# Patient Record
Sex: Female | Born: 1945 | Race: Black or African American | Hispanic: No | Marital: Single | Smoking: Never smoker
Health system: Southern US, Community
[De-identification: ages and names within clinical notes are randomized; demographics above are authoritative.]

## PROBLEM LIST (undated history)

## (undated) DIAGNOSIS — R6250 Unspecified lack of expected normal physiological development in childhood: Secondary | ICD-10-CM

## (undated) DIAGNOSIS — M7989 Other specified soft tissue disorders: Secondary | ICD-10-CM

## (undated) DIAGNOSIS — Z9989 Dependence on other enabling machines and devices: Secondary | ICD-10-CM

## (undated) DIAGNOSIS — R112 Nausea with vomiting, unspecified: Secondary | ICD-10-CM

## (undated) DIAGNOSIS — R600 Localized edema: Secondary | ICD-10-CM

## (undated) DIAGNOSIS — R0902 Hypoxemia: Secondary | ICD-10-CM

## (undated) DIAGNOSIS — F329 Major depressive disorder, single episode, unspecified: Secondary | ICD-10-CM

## (undated) DIAGNOSIS — I951 Orthostatic hypotension: Secondary | ICD-10-CM

## (undated) DIAGNOSIS — R109 Unspecified abdominal pain: Secondary | ICD-10-CM

## (undated) DIAGNOSIS — K219 Gastro-esophageal reflux disease without esophagitis: Secondary | ICD-10-CM

## (undated) DIAGNOSIS — F32A Depression, unspecified: Secondary | ICD-10-CM

## (undated) DIAGNOSIS — F419 Anxiety disorder, unspecified: Secondary | ICD-10-CM

## (undated) DIAGNOSIS — K5730 Diverticulosis of large intestine without perforation or abscess without bleeding: Secondary | ICD-10-CM

## (undated) DIAGNOSIS — R05 Cough: Secondary | ICD-10-CM

## (undated) DIAGNOSIS — R1310 Dysphagia, unspecified: Secondary | ICD-10-CM

## (undated) DIAGNOSIS — R3129 Other microscopic hematuria: Secondary | ICD-10-CM

## (undated) DIAGNOSIS — J45909 Unspecified asthma, uncomplicated: Secondary | ICD-10-CM

## (undated) DIAGNOSIS — N3941 Urge incontinence: Secondary | ICD-10-CM

## (undated) DIAGNOSIS — I10 Essential (primary) hypertension: Secondary | ICD-10-CM

## (undated) DIAGNOSIS — G4733 Obstructive sleep apnea (adult) (pediatric): Secondary | ICD-10-CM

## (undated) DIAGNOSIS — K5792 Diverticulitis of intestine, part unspecified, without perforation or abscess without bleeding: Secondary | ICD-10-CM

## (undated) DIAGNOSIS — K625 Hemorrhage of anus and rectum: Secondary | ICD-10-CM

## (undated) DIAGNOSIS — F411 Generalized anxiety disorder: Secondary | ICD-10-CM

## (undated) HISTORY — DX: Hypoxemia: R09.02

## (undated) HISTORY — DX: Generalized anxiety disorder: F41.1

## (undated) HISTORY — PX: ABDOMINAL HYSTERECTOMY: SHX81

## (undated) HISTORY — DX: Major depressive disorder, single episode, unspecified: F32.9

## (undated) HISTORY — DX: Other microscopic hematuria: R31.29

## (undated) HISTORY — DX: Localized edema: R60.0

## (undated) HISTORY — DX: Orthostatic hypotension: I95.1

## (undated) HISTORY — DX: Dependence on other enabling machines and devices: Z99.89

## (undated) HISTORY — DX: Dysphagia, unspecified: R13.10

## (undated) HISTORY — DX: Diverticulosis of large intestine without perforation or abscess without bleeding: K57.30

## (undated) HISTORY — PX: COLON SURGERY: SHX602

## (undated) HISTORY — DX: Unspecified abdominal pain: R10.9

## (undated) HISTORY — PX: BREAST BIOPSY: SHX20

## (undated) HISTORY — DX: Cough: R05

## (undated) HISTORY — DX: Hemorrhage of anus and rectum: K62.5

## (undated) HISTORY — DX: Obstructive sleep apnea (adult) (pediatric): G47.33

## (undated) HISTORY — DX: Other specified soft tissue disorders: M79.89

## (undated) HISTORY — DX: Nausea with vomiting, unspecified: R11.2

## (undated) HISTORY — DX: Urge incontinence: N39.41

## (undated) HISTORY — DX: Depression, unspecified: F32.A

---

## 2008-06-20 ENCOUNTER — Inpatient Hospital Stay: Payer: Self-pay | Admitting: Internal Medicine

## 2008-09-29 ENCOUNTER — Ambulatory Visit: Payer: Self-pay | Admitting: Gastroenterology

## 2009-03-12 ENCOUNTER — Inpatient Hospital Stay: Payer: Self-pay | Admitting: Internal Medicine

## 2010-10-10 ENCOUNTER — Inpatient Hospital Stay: Payer: Self-pay | Admitting: Internal Medicine

## 2011-06-09 ENCOUNTER — Emergency Department: Payer: Self-pay | Admitting: Emergency Medicine

## 2011-06-14 ENCOUNTER — Emergency Department: Payer: Self-pay | Admitting: Emergency Medicine

## 2011-11-28 ENCOUNTER — Ambulatory Visit: Payer: Self-pay

## 2012-03-29 ENCOUNTER — Ambulatory Visit: Payer: Self-pay

## 2012-08-19 ENCOUNTER — Inpatient Hospital Stay: Payer: Self-pay | Admitting: Internal Medicine

## 2012-08-19 LAB — URINALYSIS, COMPLETE
Bilirubin,UR: NEGATIVE
Glucose,UR: NEGATIVE mg/dL (ref 0–75)
Hyaline Cast: 185
Nitrite: NEGATIVE
Ph: 5 (ref 4.5–8.0)
Protein: 100
RBC,UR: 15 /HPF (ref 0–5)
Specific Gravity: 1.025 (ref 1.003–1.030)
Squamous Epithelial: 8
WBC UR: 6 /HPF (ref 0–5)

## 2012-08-19 LAB — COMPREHENSIVE METABOLIC PANEL
Albumin: 3.1 g/dL — ABNORMAL LOW (ref 3.4–5.0)
Alkaline Phosphatase: 100 U/L (ref 50–136)
Anion Gap: 7 (ref 7–16)
BUN: 26 mg/dL — ABNORMAL HIGH (ref 7–18)
Bilirubin,Total: 0.1 mg/dL — ABNORMAL LOW (ref 0.2–1.0)
Calcium, Total: 8.5 mg/dL (ref 8.5–10.1)
Chloride: 109 mmol/L — ABNORMAL HIGH (ref 98–107)
Co2: 26 mmol/L (ref 21–32)
Creatinine: 1.27 mg/dL (ref 0.60–1.30)
EGFR (African American): 51 — ABNORMAL LOW
EGFR (Non-African Amer.): 44 — ABNORMAL LOW
Glucose: 191 mg/dL — ABNORMAL HIGH (ref 65–99)
Osmolality: 293 (ref 275–301)
Potassium: 3.5 mmol/L (ref 3.5–5.1)
SGOT(AST): 15 U/L (ref 15–37)
SGPT (ALT): 15 U/L (ref 12–78)
Sodium: 142 mmol/L (ref 136–145)
Total Protein: 7 g/dL (ref 6.4–8.2)

## 2012-08-19 LAB — CBC
HCT: 33.8 % — ABNORMAL LOW (ref 35.0–47.0)
HGB: 10.6 g/dL — ABNORMAL LOW (ref 12.0–16.0)
MCH: 27.7 pg (ref 26.0–34.0)
MCHC: 31.4 g/dL — ABNORMAL LOW (ref 32.0–36.0)
MCV: 88 fL (ref 80–100)
Platelet: 213 10*3/uL (ref 150–440)
RBC: 3.83 10*6/uL (ref 3.80–5.20)
RDW: 15.6 % — ABNORMAL HIGH (ref 11.5–14.5)
WBC: 9.5 10*3/uL (ref 3.6–11.0)

## 2012-08-19 LAB — LIPASE, BLOOD: Lipase: 44 U/L — ABNORMAL LOW (ref 73–393)

## 2012-08-19 LAB — TROPONIN I: Troponin-I: 0.04 ng/mL

## 2012-08-19 LAB — PROTIME-INR
INR: 1.1
Prothrombin Time: 14.1 secs (ref 11.5–14.7)

## 2012-08-19 LAB — APTT: Activated PTT: 25.9 secs (ref 23.6–35.9)

## 2012-08-19 LAB — HEMOGLOBIN A1C: Hemoglobin A1C: 6.3 % (ref 4.2–6.3)

## 2012-08-20 LAB — COMPREHENSIVE METABOLIC PANEL
Albumin: 2.7 g/dL — ABNORMAL LOW (ref 3.4–5.0)
Alkaline Phosphatase: 84 U/L (ref 50–136)
Anion Gap: 8 (ref 7–16)
BUN: 22 mg/dL — ABNORMAL HIGH (ref 7–18)
Bilirubin,Total: 0.2 mg/dL (ref 0.2–1.0)
Calcium, Total: 7.8 mg/dL — ABNORMAL LOW (ref 8.5–10.1)
Chloride: 108 mmol/L — ABNORMAL HIGH (ref 98–107)
Co2: 28 mmol/L (ref 21–32)
Creatinine: 0.82 mg/dL (ref 0.60–1.30)
EGFR (African American): >60
EGFR (Non-African Amer.): >60
Glucose: 135 mg/dL — ABNORMAL HIGH (ref 65–99)
Osmolality: 292 (ref 275–301)
Potassium: 4.3 mmol/L (ref 3.5–5.1)
SGOT(AST): 12 U/L — ABNORMAL LOW (ref 15–37)
SGPT (ALT): 15 U/L (ref 12–78)
Sodium: 144 mmol/L (ref 136–145)
Total Protein: 6.1 g/dL — ABNORMAL LOW (ref 6.4–8.2)

## 2012-08-20 LAB — CBC WITH DIFFERENTIAL/PLATELET
Basophil #: 0.1 10*3/uL (ref 0.0–0.1)
Basophil %: 0.3 %
Eosinophil #: 0 10*3/uL (ref 0.0–0.7)
Eosinophil %: 0 %
HCT: 31.1 % — ABNORMAL LOW (ref 35.0–47.0)
HGB: 9.9 g/dL — ABNORMAL LOW (ref 12.0–16.0)
Lymphocyte #: 1.1 10*3/uL (ref 1.0–3.6)
Lymphocyte %: 7.1 %
MCH: 27.9 pg (ref 26.0–34.0)
MCHC: 31.7 g/dL — ABNORMAL LOW (ref 32.0–36.0)
MCV: 88 fL (ref 80–100)
Monocyte #: 0.6 x10 3/mm (ref 0.2–0.9)
Monocyte %: 3.9 %
Neutrophil #: 14.3 10*3/uL — ABNORMAL HIGH (ref 1.4–6.5)
Neutrophil %: 88.7 %
Platelet: 168 10*3/uL (ref 150–440)
RBC: 3.54 10*6/uL — ABNORMAL LOW (ref 3.80–5.20)
RDW: 15.1 % — ABNORMAL HIGH (ref 11.5–14.5)
WBC: 16.1 10*3/uL — ABNORMAL HIGH (ref 3.6–11.0)

## 2012-08-20 LAB — HEMOGLOBIN
HGB: 9.5 g/dL — ABNORMAL LOW (ref 12.0–16.0)
HGB: 9.2 g/dL — ABNORMAL LOW (ref 12.0–16.0)
HGB: 9 g/dL — ABNORMAL LOW (ref 12.0–16.0)

## 2012-08-20 LAB — MAGNESIUM: Magnesium: 1.8 mg/dL

## 2012-08-21 LAB — CBC WITH DIFFERENTIAL/PLATELET
Basophil #: 0.1 10*3/uL (ref 0.0–0.1)
Basophil %: 0.6 %
Eosinophil #: 0.1 10*3/uL (ref 0.0–0.7)
Eosinophil %: 0.5 %
HCT: 26.7 % — ABNORMAL LOW (ref 35.0–47.0)
HGB: 8.6 g/dL — ABNORMAL LOW (ref 12.0–16.0)
Lymphocyte #: 3.4 10*3/uL (ref 1.0–3.6)
Lymphocyte %: 36 %
MCH: 28.5 pg (ref 26.0–34.0)
MCHC: 32.4 g/dL (ref 32.0–36.0)
MCV: 88 fL (ref 80–100)
Monocyte #: 0.8 x10 3/mm (ref 0.2–0.9)
Monocyte %: 8.5 %
Neutrophil #: 5.1 10*3/uL (ref 1.4–6.5)
Neutrophil %: 54.4 %
Platelet: 154 10*3/uL (ref 150–440)
RBC: 3.03 10*6/uL — ABNORMAL LOW (ref 3.80–5.20)
RDW: 15.5 % — ABNORMAL HIGH (ref 11.5–14.5)
WBC: 9.4 10*3/uL (ref 3.6–11.0)

## 2012-08-22 LAB — PROTIME-INR
INR: 1.1
Prothrombin Time: 14.8 secs — ABNORMAL HIGH (ref 11.5–14.7)

## 2012-08-22 LAB — CBC WITH DIFFERENTIAL/PLATELET
Basophil #: 0.1 10*3/uL (ref 0.0–0.1)
Basophil %: 0.8 %
Eosinophil #: 0 10*3/uL (ref 0.0–0.7)
Eosinophil %: 0.4 %
HCT: 27.2 % — ABNORMAL LOW (ref 35.0–47.0)
HGB: 8.5 g/dL — ABNORMAL LOW (ref 12.0–16.0)
Lymphocyte #: 2.4 10*3/uL (ref 1.0–3.6)
Lymphocyte %: 24.3 %
MCH: 27.8 pg (ref 26.0–34.0)
MCHC: 31.3 g/dL — ABNORMAL LOW (ref 32.0–36.0)
MCV: 89 fL (ref 80–100)
Monocyte #: 0.7 x10 3/mm (ref 0.2–0.9)
Monocyte %: 7 %
Neutrophil #: 6.7 10*3/uL — ABNORMAL HIGH (ref 1.4–6.5)
Neutrophil %: 67.5 %
Platelet: 177 10*3/uL (ref 150–440)
RBC: 3.06 10*6/uL — ABNORMAL LOW (ref 3.80–5.20)
RDW: 15.6 % — ABNORMAL HIGH (ref 11.5–14.5)
WBC: 10 10*3/uL (ref 3.6–11.0)

## 2012-08-22 LAB — APTT: Activated PTT: 28.6 secs (ref 23.6–35.9)

## 2012-08-23 LAB — BASIC METABOLIC PANEL
Anion Gap: 2 — ABNORMAL LOW (ref 7–16)
BUN: 17 mg/dL (ref 7–18)
Calcium, Total: 8.4 mg/dL — ABNORMAL LOW (ref 8.5–10.1)
Chloride: 106 mmol/L (ref 98–107)
Co2: 32 mmol/L (ref 21–32)
Creatinine: 0.95 mg/dL (ref 0.60–1.30)
EGFR (African American): >60
EGFR (Non-African Amer.): >60
Glucose: 107 mg/dL — ABNORMAL HIGH (ref 65–99)
Osmolality: 281 (ref 275–301)
Potassium: 3.8 mmol/L (ref 3.5–5.1)
Sodium: 140 mmol/L (ref 136–145)

## 2012-08-23 LAB — CBC WITH DIFFERENTIAL/PLATELET
Basophil #: 0 10*3/uL (ref 0.0–0.1)
Basophil %: 0.4 %
Eosinophil #: 0.2 10*3/uL (ref 0.0–0.7)
Eosinophil %: 1.9 %
HCT: 27.3 % — ABNORMAL LOW (ref 35.0–47.0)
HGB: 8.8 g/dL — ABNORMAL LOW (ref 12.0–16.0)
Lymphocyte #: 3.1 10*3/uL (ref 1.0–3.6)
Lymphocyte %: 32.6 %
MCH: 28.7 pg (ref 26.0–34.0)
MCHC: 32.3 g/dL (ref 32.0–36.0)
MCV: 89 fL (ref 80–100)
Monocyte #: 0.8 x10 3/mm (ref 0.2–0.9)
Monocyte %: 8.6 %
Neutrophil #: 5.4 10*3/uL (ref 1.4–6.5)
Neutrophil %: 56.5 %
Platelet: 185 10*3/uL (ref 150–440)
RBC: 3.08 10*6/uL — ABNORMAL LOW (ref 3.80–5.20)
RDW: 15.3 % — ABNORMAL HIGH (ref 11.5–14.5)
WBC: 9.5 10*3/uL (ref 3.6–11.0)

## 2013-04-05 ENCOUNTER — Emergency Department: Payer: Self-pay | Admitting: Emergency Medicine

## 2013-04-05 LAB — COMPREHENSIVE METABOLIC PANEL
Albumin: 3.5 g/dL (ref 3.4–5.0)
Alkaline Phosphatase: 119 U/L (ref 50–136)
Anion Gap: 6 — ABNORMAL LOW (ref 7–16)
BUN: 19 mg/dL — ABNORMAL HIGH (ref 7–18)
Bilirubin,Total: 0.3 mg/dL (ref 0.2–1.0)
Calcium, Total: 9.2 mg/dL (ref 8.5–10.1)
Chloride: 101 mmol/L (ref 98–107)
Co2: 30 mmol/L (ref 21–32)
Creatinine: 0.88 mg/dL (ref 0.60–1.30)
EGFR (African American): >60
EGFR (Non-African Amer.): >60
Glucose: 101 mg/dL — ABNORMAL HIGH (ref 65–99)
Osmolality: 276 (ref 275–301)
Potassium: 4.7 mmol/L (ref 3.5–5.1)
SGOT(AST): 22 U/L (ref 15–37)
SGPT (ALT): 17 U/L (ref 12–78)
Sodium: 137 mmol/L (ref 136–145)
Total Protein: 8.2 g/dL (ref 6.4–8.2)

## 2013-04-05 LAB — URINALYSIS, COMPLETE
Bilirubin,UR: NEGATIVE
Glucose,UR: NEGATIVE mg/dL (ref 0–75)
Ketone: NEGATIVE
Leukocyte Esterase: NEGATIVE
Nitrite: NEGATIVE
Ph: 5 (ref 4.5–8.0)
Protein: NEGATIVE
RBC,UR: 1 /HPF (ref 0–5)
Specific Gravity: 1.01 (ref 1.003–1.030)
Squamous Epithelial: 1
WBC UR: 1 /HPF (ref 0–5)

## 2013-04-05 LAB — CBC
HCT: 40.1 % (ref 35.0–47.0)
HGB: 12.9 g/dL (ref 12.0–16.0)
MCH: 27.6 pg (ref 26.0–34.0)
MCHC: 32.3 g/dL (ref 32.0–36.0)
MCV: 86 fL (ref 80–100)
Platelet: 202 10*3/uL (ref 150–440)
RBC: 4.68 10*6/uL (ref 3.80–5.20)
RDW: 16.9 % — ABNORMAL HIGH (ref 11.5–14.5)
WBC: 7.3 10*3/uL (ref 3.6–11.0)

## 2013-04-05 LAB — TROPONIN I: Troponin-I: <0.02 ng/mL

## 2013-04-05 LAB — LIPASE, BLOOD: Lipase: 93 U/L (ref 73–393)

## 2014-01-06 LAB — BASIC METABOLIC PANEL
BUN: 13 mg/dL (ref 4–21)
Creatinine: 0.8 mg/dL (ref <=1.1)

## 2014-01-06 LAB — TSH: TSH: 1.2 u[IU]/mL (ref <=5.90)

## 2014-02-01 ENCOUNTER — Emergency Department: Payer: Self-pay | Admitting: Emergency Medicine

## 2014-02-01 LAB — CBC WITH DIFFERENTIAL/PLATELET
Basophil #: 0 10*3/uL (ref 0.0–0.1)
Basophil %: 0.6 %
Eosinophil #: 0.3 10*3/uL (ref 0.0–0.7)
Eosinophil %: 3.6 %
HCT: 39.6 % (ref 35.0–47.0)
HGB: 12.5 g/dL (ref 12.0–16.0)
Lymphocyte #: 2.1 10*3/uL (ref 1.0–3.6)
Lymphocyte %: 24.7 %
MCH: 28.5 pg (ref 26.0–34.0)
MCHC: 31.6 g/dL — ABNORMAL LOW (ref 32.0–36.0)
MCV: 90 fL (ref 80–100)
Monocyte #: 0.7 x10 3/mm (ref 0.2–0.9)
Monocyte %: 8.1 %
Neutrophil #: 5.4 10*3/uL (ref 1.4–6.5)
Neutrophil %: 63 %
Platelet: 239 10*3/uL (ref 150–440)
RBC: 4.39 10*6/uL (ref 3.80–5.20)
RDW: 15.9 % — ABNORMAL HIGH (ref 11.5–14.5)
WBC: 8.6 10*3/uL (ref 3.6–11.0)

## 2014-02-01 LAB — COMPREHENSIVE METABOLIC PANEL
Albumin: 3.6 g/dL (ref 3.4–5.0)
Alkaline Phosphatase: 106 U/L
Anion Gap: 6 — ABNORMAL LOW (ref 7–16)
BUN: 14 mg/dL (ref 7–18)
Bilirubin,Total: 0.2 mg/dL (ref 0.2–1.0)
Calcium, Total: 9.2 mg/dL (ref 8.5–10.1)
Chloride: 105 mmol/L (ref 98–107)
Co2: 29 mmol/L (ref 21–32)
Creatinine: 1.04 mg/dL (ref 0.60–1.30)
EGFR (African American): >60
EGFR (Non-African Amer.): 56 — ABNORMAL LOW
Glucose: 103 mg/dL — ABNORMAL HIGH (ref 65–99)
Osmolality: 280 (ref 275–301)
Potassium: 3.5 mmol/L (ref 3.5–5.1)
SGOT(AST): 22 U/L (ref 15–37)
SGPT (ALT): 22 U/L (ref 12–78)
Sodium: 140 mmol/L (ref 136–145)
Total Protein: 8.6 g/dL — ABNORMAL HIGH (ref 6.4–8.2)

## 2014-02-01 LAB — TROPONIN I: Troponin-I: <0.02 ng/mL

## 2014-02-01 LAB — LIPASE, BLOOD: Lipase: 57 U/L — ABNORMAL LOW (ref 73–393)

## 2014-04-21 ENCOUNTER — Ambulatory Visit: Payer: Self-pay | Admitting: Gastroenterology

## 2014-04-21 LAB — HM COLONOSCOPY

## 2014-06-27 ENCOUNTER — Ambulatory Visit: Payer: Self-pay | Admitting: Internal Medicine

## 2014-06-27 LAB — HM MAMMOGRAPHY: HM Mammogram: NORMAL

## 2014-11-07 NOTE — Consult Note (Signed)
Admit Diagnosis:   ACUTE GI BLEEDING: 22-Aug-2012, Active, ACUTE GI BLEEDING      Admit Reason:   Acute GI bleeding: (578.9) 19-Aug-2012, Active, ICD9, Unspecified, hemorrhage of gastrointestinal tract    Present Illness 69 year-old  admitted for GI bleeding. Noted to have blood on external vulva, moderate amount. S/P hysterectomy. reports no recent vaginal bleeding or hematuria. some vaginal discharge and odor off and on for months. Denies use of pessary, denies any recent gyn problems or surgery. Denies prolapse.     Fluconazole tablet,  ( Diflucan)  150 mg Oral daily  Stop After: 3 Days  - Indication: Antifungal, 22-Aug-2012, Active, Standard   metroNIDAZOLE 0.75% Vag Gel, ( Metrogel 0.75% Vag gel )  1 applicator Vaginal at bedtime  Stop After: 5 Days  -Indication:Infection, 22-Aug-2012, Active, Standard   metroNIDAZOLE tablet, ( Flagyl)  500 mg Oral q12h  Stop After: 7 Days  - Indication: Infection, 22-Aug-2012, Active, Standard   Ob/Gyn Consult, In [place] - requires response within 24 hours  Notify-on call  Reason for-please evaluate for possible vaginal bleed ( know Hx of Hysteroctomy).  Note:ewilliams  Notified Physician Office on 21-Aug-2012 at 14:42  Elgie Collard added as a consulting care provider, 21-Aug-2012, Pending, Standard   Hygiene, 20-Aug-2012, Active, Standard   No Known Allergies:   Case History and Physical Exam:   Chief Complaint gi bleeding    Past Medical Health Hypertension    Past Surgical History TAH    Primary Care Provider [facility]    Family History Non-Contributory    Abdomen Benign    Genitalia Other  malodorous discharge, no bleeding from vagina noted, bimanual exam no vaginal masses, no thickening, no blood on glove.  foley  small blood , uretha normal  and intact, no vaginal or vulvar ulcreation or tears noted. adnexal exam limited by Body Mass Index.  Uterus absent.    Skin Other  no  ulceration of mons, vulva   Review of Systems:   Medications/Allergies Reviewed Medications/Allergies reviewed   Nursing/Ancillary Notes: **Vital Signs.:   05-Feb-14 03:39   Vital Signs Type Recheck; temp taken at this time d/t family request to let pt rest after tylenol  given   Temperature Temperature (F) 98.6    05:44   Vital Signs Type POCT    05:50   Vital Signs Type Routine   Temperature Temperature (F) 38.8   Systolic BP Systolic BP 828   Diastolic BP (mmHg) Diastolic BP (mmHg) 66    00:34   Vital Signs Type Q 4hr   Temperature Temperature (F) 98   Systolic BP Systolic BP 917   Diastolic BP (mmHg) Diastolic BP (mmHg) 71    91:50   Vital Signs Type Routine   Temperature Temperature (F) 56.9   Systolic BP Systolic BP 794   Diastolic BP (mmHg) Diastolic BP (mmHg) 64    80:16   Vital Signs Type Q 4hr   Temperature Temperature (F) 98   Systolic BP Systolic BP 553   Diastolic BP (mmHg) Diastolic BP (mmHg) 71   Hepatic:  02-Feb-14 18:11    Bilirubin, Total  0.1   Alkaline Phosphatase 100   SGPT (ALT) 15   SGOT (AST) 15   Total Protein, Serum 7.0   Albumin, Serum  3.1  03-Feb-14 00:00    Bilirubin, Total 0.2   Alkaline Phosphatase 84   SGPT (ALT) 15   SGOT (AST)  12   Total Protein, Serum  6.1  Albumin, Serum  2.7  Routine BB:  02-Feb-14 18:11    Crossmatch Unit 1 Ready   Crossmatch Unit 2 Transfused   Crossmatch Unit 3 Cancelled   Crossmatch Unit 4 Ready   ABO Group + Rh Type O Positive   Antibody Screen NEGATIVE (Result(s) reported on 19 Aug 2012 at 07:01PM.)  Cardiology:  02-Feb-14 18:13    Ventricular Rate 80   Atrial Rate 80   P-R Interval 134   QRS Duration 86   QT 368   QTc 424   P Axis 66   R Axis 52   T Axis 59   ECG interpretation Normal sinus rhythm Nonspecific ST and T wave abnormality Abnormal ECG When compared with ECG of 14-Jun-2011 11:47, Nonspecific T wave abnormality has replaced inverted T waves in Lateral  leads ----------unconfirmed---------- Confirmed by OVERREAD, NOT ([id number]), editor PEARSON, BARBARA ([id number]) on 08/21/2012 12:09:15 PM  Routine Chem:  02-Feb-14 18:11    Glucose, Serum  191   BUN  26   Creatinine (comp) 1.27   Sodium, Serum 142   Potassium, Serum 3.5   Chloride, Serum  109   CO2, Serum 26   Calcium (Total), Serum 8.5   Osmolality (calc) 293   eGFR (African American)  51   eGFR (Non-African American)  44 (eGFR values <65m/min/1.73 m2 may be an indication of chronic kidney disease (CKD). Calculated eGFR is useful in patients with stable renal function. The eGFR calculation will not be reliable in acutely ill patients when serum creatinine is changing rapidly. It is not useful in  patients on dialysis. The eGFR calculation may not be applicable to patients at the low and high extremes of body sizes, pregnant women, and vegetarians.)   Anion Gap 7   Hemoglobin A1c ([facility]) 6.3 (The American Diabetes Association recommends that a primary goal of therapy should be <7% and that physicians should reevaluate the treatment regimen in patients with HbA1c values consistently >8%.)   Result Comment XMATCH - PRODUCT NOT INDICATED.2 XMATCHES  - CURRENTLY AVAILABLE  Result(s) reported on 21 Aug 2012 at 12:24PM.   Lipase  44 (Result(s) reported on 19 Aug 2012 at 06:40PM.)  03-Feb-14 00:00    Glucose, Serum  135   BUN  22   Creatinine (comp) 0.82   Sodium, Serum 144   Potassium, Serum 4.3   Chloride, Serum  108   CO2, Serum 28   Calcium (Total), Serum  7.8   Osmolality (calc) 292   eGFR (African American) >60   eGFR (Non-African American) >60 (eGFR values <668mmin/1.73 m2 may be an indication of chronic kidney disease (CKD). Calculated eGFR is useful in patients with stable renal function. The eGFR calculation will not be reliable in acutely ill patients when serum creatinine is changing rapidly. It is not useful in  patients on dialysis. The eGFR calculation may not be  applicable to patients at the low and high extremes of body sizes, pregnant women, and vegetarians.)   Anion Gap 8   Magnesium, Serum 1.8 (1.8-2.4 THERAPEUTIC RANGE: 4-7 mg/dL TOXIC: > 10 mg/dL  -----------------------)  Cardiac:  02-Feb-14 18:11    Troponin I 0.04 (0.00-0.05 0.05 ng/mL or less: NEGATIVE  Repeat testing in 3-6 hrs  if clinically indicated. >0.05 ng/mL: POTENTIAL  MYOCARDIAL INJURY. Repeat  testing in 3-6 hrs if  clinically indicated. NOTE: An increase or decrease  of 30% or more on serial  testing suggests a  clinically important change)  Routine UA:  02-Feb-14 18:54  Color (UA) Amber   Clarity (UA) Cloudy   Glucose (UA) Negative   Bilirubin (UA) Negative   Ketones (UA) Trace   Specific Gravity (UA) 1.025   Blood (UA) 2+   pH (UA) 5.0   Protein (UA) 100 mg/dL   Nitrite (UA) Negative   Leukocyte Esterase (UA) Trace (Result(s) reported on 19 Aug 2012 at 07:17PM.)   RBC (UA) 15 /HPF   WBC (UA) 6 /HPF   Bacteria (UA) TRACE   Epithelial Cells (UA) 8 /HPF   Mucous (UA) PRESENT   Hyaline Cast (UA) 185 /LPF   Calcium Oxalate Crystal (UA) PRESENT (Result(s) reported on 19 Aug 2012 at 07:17PM.)  Routine Coag:  02-Feb-14 18:11    Prothrombin 14.1   INR 1.1 (INR reference interval applies to patients on anticoagulant therapy. A single INR therapeutic range for coumarins is not optimal for all indications; however, the suggested range for most indications is 2.0 - 3.0. Exceptions to the INR Reference Range may include: Prosthetic heart valves, acute myocardial infarction, prevention of myocardial infarction, and combinations of aspirin and anticoagulant. The need for a higher or lower target INR must be assessed individually. Reference: The Pharmacology and Management of the Vitamin K  antagonists: the seventh ACCP Conference on Antithrombotic and Thrombolytic Therapy. [id number].9741 Sept:126 (3suppl): N9146842. A HCT value >55% may artifactually increase  the PT.  In one study,  the increase was an average of 25%. Reference:  "Effect on Routine and Special Coagulation Testing Values of Citrate Anticoagulant Adjustment in Patients with High HCT Values." American Journal of Clinical Pathology 2006;126:400-405.)   Activated PTT (APTT) 25.9 (A HCT value >55% may artifactually increase the APTT. In one study, the increase was an average of 19%. Reference: "Effect on Routine and Special Coagulation Testing Values of Citrate Anticoagulant Adjustment in Patients with High HCT Values." American Journal of Clinical Pathology 2006;126:400-405.)  05-Feb-14 04:43    Prothrombin  14.8   INR 1.1 (INR reference interval applies to patients on anticoagulant therapy. A single INR therapeutic range for coumarins is not optimal for all indications; however, the suggested range for most indications is 2.0 - 3.0. Exceptions to the INR Reference Range may include: Prosthetic heart valves, acute myocardial infarction, prevention of myocardial infarction, and combinations of aspirin and anticoagulant. The need for a higher or lower target INR must be assessed individually. Reference: The Pharmacology and Management of the Vitamin K  antagonists: the seventh ACCP Conference on Antithrombotic and Thrombolytic Therapy. ULAGT.3646 Sept:126 (3suppl): N9146842. A HCT value >55% may artifactually increase the PT.  In one study,  the increase was an average of 25%. Reference:  "Effect on Routine and Special Coagulation Testing Values of Citrate Anticoagulant Adjustment in Patients with High HCT Values." American Journal of Clinical Pathology 2006;126:400-405.)   Activated PTT (APTT) 28.6 (A HCT value >55% may artifactually increase the APTT. In one study, the increase was an average of 19%. Reference: "Effect on Routine and Special Coagulation Testing Values of Citrate Anticoagulant Adjustment in Patients with High HCT Values." American Journal of Clinical Pathology  2006;126:400-405.)  Routine Hem:  02-Feb-14 18:11    WBC (CBC) 9.5   RBC (CBC) 3.83   Hemoglobin (CBC)  10.6   Hematocrit (CBC)  33.8   Platelet Count (CBC) 213 (Result(s) reported on 19 Aug 2012 at 06:24PM.)   MCV 88   MCH 27.7   MCHC  31.4   RDW  15.6  03-Feb-14 00:00    WBC (CBC)  16.1  RBC (CBC)  3.54   Hemoglobin (CBC)  9.9   Hematocrit (CBC)  31.1   Platelet Count (CBC) 168   MCV 88   MCH 27.9   MCHC  31.7   RDW  15.1   Neutrophil % 88.7   Lymphocyte % 7.1   Monocyte % 3.9   Eosinophil % 0.0   Basophil % 0.3   Neutrophil #  14.3   Lymphocyte # 1.1   Monocyte # 0.6   Eosinophil # 0.0   Basophil # 0.1 (Result(s) reported on 20 Aug 2012 at 12:24AM.)    06:17    Hemoglobin (CBC)  9.5 (Result(s) reported on 20 Aug 2012 at 06:41AM.)    10:53    Hemoglobin (CBC)  9.2 (Result(s) reported on 20 Aug 2012 at 11:22AM.)    17:41    Hemoglobin (CBC)  9.0 (Result(s) reported on 20 Aug 2012 at 06:17PM.)  04-Feb-14 04:44    WBC (CBC) 9.4   RBC (CBC)  3.03   Hemoglobin (CBC)  8.6   Hematocrit (CBC)  26.7   Platelet Count (CBC) 154   MCV 88   MCH 28.5   MCHC 32.4   RDW  15.5   Neutrophil % 54.4   Lymphocyte % 36.0   Monocyte % 8.5   Eosinophil % 0.5   Basophil % 0.6   Neutrophil # 5.1   Lymphocyte # 3.4   Monocyte # 0.8   Eosinophil # 0.1   Basophil # 0.1 (Result(s) reported on 21 Aug 2012 at 05:51AM.)  05-Feb-14 04:43    WBC (CBC) 10.0   RBC (CBC)  3.06   Hemoglobin (CBC)  8.5   Hematocrit (CBC)  27.2   Platelet Count (CBC) 177   MCV 89   MCH 27.8   MCHC  31.3   RDW  15.6   Neutrophil % 67.5   Lymphocyte % 24.3   Monocyte % 7.0   Eosinophil % 0.4   Basophil % 0.8   Neutrophil #  6.7   Lymphocyte # 2.4   Monocyte # 0.7   Eosinophil # 0.0   Basophil # 0.1 (Result(s) reported on 22 Aug 2012 at 06:34AM.)     Impression GI bleeding, stable S/P hysterectomy Vaginal bleeding, resolved Vaginitis Vulvitis Bacterial Vaginosis Atrophy of Menopause     Plan Oral and vaginal treatment with metronidazole for BV Diflucan for candidiasis Perineal care to decrease bacterial load and prevent post foley UTI Continue treatment at home if discharged before finishing all meds.  No further follow up needed for this problem.   Electronic Signatures: Elgie Collard (MD)  (Signed 05-Feb-14 18:16)  Authored: Health Issues, General Aspect/Present Illness, Orders, Allergies, History and Physical Exam, Vital Signs, Labs, Impression/Plan   Last Updated: 05-Feb-14 18:16 by Elgie Collard (MD)

## 2014-11-07 NOTE — Consult Note (Signed)
PATIENT NAME:  Taylor Johnson, Taylor Johnson MR#:  [id number] DATE OF BIRTH:  October 15, 1945  DATE OF CONSULTATION:  08/19/2012  REFERRING PHYSICIAN:  [facility] CONSULTING PHYSICIAN:  Micheline Maze, MD PRIMARY CARE PHYSICIAN: Halina Maidens, MD     CHIEF COMPLAINT: Lower intestinal bleed.   BRIEF HISTORY: The patient is a 69 year old woman admitted through the Emergency Room with significant lower GI bleed. She presented hypotensive with marked hematochezia. She responded immediately to resuscitation and was admitted for further evaluation.   She has a previous history of  significant lower GI bleeding felt to be from a diverticular source. She has had multiple colonoscopies. In 2009, she underwent an IMA embolization. She has not required any other surgical procedures. Colonoscopy revealed pan diverticulosis. She has had a previous abdominal hysterectomy. She has had previous breast surgery with lumpectomy for benign disease. She has a history of hypertension. No cardiac disease or diabetes.   CURRENT MEDICATIONS: All have been discontinued. She does not take any antihypertensives at the present time. She is followed at the [facility]. She is not using any alcohol or tobacco.   FAMILY HISTORY: Noncontributory to the current problem.   ALLERGIES: She has no drug allergies.   SOCIAL HISTORY: She denies history of hepatitis, yellow jaundice, pancreatitis, peptic ulcer disease or gallbladder disease.   PHYSICAL EXAMINATION: GENERAL: She is an alert but clearly unhappy with nasogastric suction. She is angry at the circumstances and minimally cooperative during the examination.  VITALS: Blood pressure 117/84, heart rate 62 and regular, oxygen saturation 100% on 2 liters.  HEENT: No scleral icterus. No facial deformity and no pupillary abnormalities.  NECK: Supple without adenopathy or tenderness. She has a midline trachea.  CHEST: Clear bilaterally with no adventitious sounds. She has normal pulmonary  excursion.   CARDIAC: No murmurs or gallops. She seems to be in normal sinus rhythm.  ABDOMEN: Soft with minimal distention. No tenderness. No rebound. No guarding. No hernias. She has hypoactive but present bowel sounds.  EXTREMITIES: Lower extremity exam reveals full range of motion, no deformities. PSYCHIATRIC: Unhappy, almost angry patient but no evidence of any disorientation.   IMPRESSION: This woman appears to have a lower gastrointestinal bleed likely from a diverticular source or sigmoid colon. She has not had a localization procedure, thus far. I do not see any urgent surgical indications. We will continue to follow her while she is hospitalized.   ____________________________ Micheline Maze, MD rle:cb D: 08/19/2012 22:31:51 ET T: 08/20/2012 09:36:21 ET JOB#: [id number]  cc: Micheline Maze, MD, <Dictator> Halina Maidens, MD Rodena Goldmann MD ELECTRONICALLY SIGNED 08/29/2012 18:04

## 2014-11-07 NOTE — Consult Note (Signed)
Chief Complaint:   Subjective/Chief Complaint Patient had dark stool last night. No red blood. H and H unchanged.   VITAL SIGNS/ANCILLARY NOTES: **Vital Signs.:   05-Feb-14 05:50   Vital Signs Type Routine   Temperature Temperature (F) 99.1   Celsius 37.2   Temperature Source Oral   Pulse Pulse 87   Respirations Respirations 18   Systolic BP Systolic BP 833   Diastolic BP (mmHg) Diastolic BP (mmHg) 66   Mean BP 79   Pulse Ox % Pulse Ox % 94   Pulse Ox Activity Level  At rest   Oxygen Delivery 1L   Brief Assessment:   Additional Physical Exam Abdomen is soft and benign.   Lab Results: Routine Coag:  05-Feb-14 04:43    Prothrombin  14.8   INR 1.1 (INR reference interval applies to patients on anticoagulant therapy. A single INR therapeutic range for coumarins is not optimal for all indications; however, the suggested range for most indications is 2.0 - 3.0. Exceptions to the INR Reference Range may include: Prosthetic heart valves, acute myocardial infarction, prevention of myocardial infarction, and combinations of aspirin and anticoagulant. The need for a higher or lower target INR must be assessed individually. Reference: The Pharmacology and Management of the Vitamin K  antagonists: the seventh ACCP Conference on Antithrombotic and Thrombolytic Therapy. ASNKN.3976 Sept:126 (3suppl): [id number]. A HCT value >55% may artifactually increase the PT.  In one study,  the increase was an average of 25%. Reference:  "Effect on Routine and Special Coagulation Testing Values of Citrate Anticoagulant Adjustment in Patients with High HCT Values." American Journal of Clinical Pathology 2006;126:400-405.)   Activated PTT (APTT) 28.6 (A HCT value >55% may artifactually increase the APTT. In one study, the increase was an average of 19%. Reference: "Effect on Routine and Special Coagulation Testing Values of Citrate Anticoagulant Adjustment in Patients with High HCT  Values." American Journal of Clinical Pathology 2006;126:400-405.)  Routine Hem:  05-Feb-14 04:43    WBC (CBC) 10.0   RBC (CBC)  3.06   Hemoglobin (CBC)  8.5   Hematocrit (CBC)  27.2   Platelet Count (CBC) 177   MCV 89   MCH 27.8   MCHC  31.3   RDW  15.6   Neutrophil % 67.5   Lymphocyte % 24.3   Monocyte % 7.0   Eosinophil % 0.4   Basophil % 0.8   Neutrophil #  6.7   Lymphocyte # 2.4   Monocyte # 0.7   Eosinophil # 0.0   Basophil # 0.1 (Result(s) reported on 22 Aug 2012 at 06:34AM.)   Assessment/Plan:  Assessment/Plan:   Assessment Probable diverticular bleed, clinically resolved. H and H stable.    Plan Will advance to full liquids. If no further bleeding for next 24-48 hours may be discharged with OP GI follow up.   Electronic Signatures: Jill Side (MD)  (Signed 05-Feb-14 09:36)  Authored: Chief Complaint, VITAL SIGNS/ANCILLARY NOTES, Brief Assessment, Lab Results, Assessment/Plan   Last Updated: 05-Feb-14 09:36 by Jill Side (MD)

## 2014-11-07 NOTE — Consult Note (Signed)
PATIENT NAME:  Taylor Johnson, WARR MR#:  [id number] DATE OF BIRTH:  February 22, 1946  DATE OF CONSULTATION:  08/20/2012  REFERRING PHYSICIAN:       CONSULTING PHYSICIAN:  Jill Side, MD  REASON FOR CONSULTATION: Lower GI bleed.   HISTORY OF PRESENT ILLNESS: This is a 69 year old female who apparently has had at least 2 episodes of diverticular bleeding in the past. It appears that her last episode of bleeding was about 2 years ago. Colonoscopy in 2010 showed pan diverticulosis and apparently the patient had IMA embolization at some point as well. The patient was in her normal state of health until yesterday afternoon when she started to pass a large amount of bright red blood per rectum. She had altered mental status, brought to the Emergency Room where systolic blood pressure was only in the 60s. She was hydrated and resuscitated and responded fairly well. She was admitted to the Intensive Care Unit. An NG tube did not show any bleeding. Since admission the patient has had only 1 small bowel movement last night, which was somewhat bloody and a very small amount of blood-tinged liquid per rectum this morning. Her hemoglobin and hematocrit are fairly stable except for a minimal drop since admission. She denies any abdominal pain. She was vomiting according to her, before. She came to the Emergency Room, but not anymore.   PAST MEDICAL HISTORY: 1.  History of lower GI bleed at least twice in the past thought to be secondary to diverticulosis. 2.  Colonoscopy in 2010 showed pan diverticulosis.  3.  History of IMA embolization in the past.  4.  History of fibrocystic breast disease. 5.  Questionable hypertension.   PAST SURGICAL HISTORY: 1.  Hysterectomy.  2.  Lumpectomy.   MEDICATIONS: None.   ALLERGIES: None.   SOCIAL HISTORY: Does not smoke or drink.   FAMILY HISTORY: Unremarkable.   REVIEW OF SYSTEMS:  Grossly negative except for what is mentioned in the History of Present Illness.   PHYSICAL  EXAMINATION: GENERAL: Fairly well built female, somewhat obese. She does not appear to be in any acute distress, NG tube is in place draining light green liquid. VITAL SIGNS:  Respirations about 15 to 18. Heart rate is in 60s and 70s. Blood pressure is stable at 116/40.  LUNGS: Grossly clear to auscultation bilaterally with fair entry.  CARDIOVASCULAR: Regular rate and rhythm. No gallops or murmur.  ABDOMEN: Abdomen is very soft and benign. Bowel sounds positive. Nontender, nondistended. No rebound or guarding was noted.  NEUROLOGIC: She is quite awake and alert at this time.   LABORATORY AND DIAGNOSTIC DATA:   Her most recent hemoglobin is 9.2, it was 10.6 on admission. White cell count was 9.5 on admission, reported to be 16 this morning.  Platelet count is normal at 168. Electrolytes are fairly unremarkable. BUN is slightly elevated at 22 and creatinine is normal.   ASSESSMENT AND PLAN: The patient is with what appears to be recurrent diverticular bleed. This is probably her third bleed within the last few years. Colonoscopy in 2010 showed pan diverticulosis. The patient has been evaluated by surgery as well. Currently there are no signs of active bleeding and the bleeding seems to have subsided. Her hemoglobin and hematocrit are stable and she is hemodynamically stable as well. I would recommend discontinuing the nasogastric tube. The patient can be started on clear liquid diet. I agree with Dr. Pat Patrick that further localization of the site of bleeding would be helpful in determining further treatment  plan. Since there is no active bleeding, this would be difficult and I would recommend continuing to observe the patient and obtaining a stat bleeding scan in case of recurrent bleeding. Follow hemoglobin and hematocrit and transfuse if needed. Further recommendations to follow.  Will follow.      ____________________________ Jill Side, MD si:ct D: 08/20/2012 12:25:11 ET T: 08/20/2012 12:57:48  ET JOB#: [id number]  cc: Jill Side, MD, <Dictator> Jill Side MD ELECTRONICALLY SIGNED 08/24/2012 12:57

## 2014-11-07 NOTE — Consult Note (Signed)
Chief Complaint:   Subjective/Chief Complaint One episode of rectal bleeding last night vs ? vaginal bleeding. Bleeding scan negative. H and H stable.  Agree with continued observation and gynae consultation. Follo H and H . Will follow.   Electronic Signatures: Jill Side (MD)  (Signed 04-Feb-14 17:07)  Authored: Chief Complaint   Last Updated: 04-Feb-14 17:07 by Jill Side (MD)

## 2014-11-07 NOTE — H&P (Signed)
PATIENT NAME:  Taylor Johnson, Taylor Johnson MR#:  [id number] DATE OF BIRTH:  [birth year]  DATE OF ADMISSION:  08/19/2012  REASON FOR ADMISSION: Lower GI bleeding with hemorrhagic shock.   PRIMARY CARE PHYSICIAN: Dr. Halina Maidens.   REFERRING PHYSICIAN: Dr. Graciella Freer.   HISTORY OF PRESENT ILLNESS: The patient is a very nice 69 year old female who has a history of diverticular bleeding in the past from sigmoid colon diverticulosis and previous episodes of diverticulitis. She came today with a history of acute bleeding. Once she came back from church, sat down on the toilet and saw a lot of blood coming out with blood clots. The patient was brought here to the ER by her family. She was confused with altered mental status due to her bleeding and shock. Her blood pressure was low in the 60s. At that moment in the ER when she was evaluated by Dr. Jasmine December, the patient had significant bleeding coming out without any signs of decreasing from her rectum. The bleeding was bright red. The patient was given 4 liters of fluid and 1 unit of packed red blood cells. At this moment, her blood pressure has improved. She was very lethargic and not able to answer questions while she was in shock. At this moment, she is a little bit better. She is somehow regaining color and consciousness but still she seems to be lethargic and confused to some degree. I spoke with the family for a long time. They had a lot of questions. Mostly, all of the questions were what is going to happen in the future. At this moment, I tried to answer them to my best, although most of those questions will still not have an answer until we take care of the acute issue. The patient has been her normal self. She is not able to answer questions right now due to still being lethargic and having a lot of discomfort through her nose due to the NG tube. She does not want to talk. The family says that the patient has been in her normal state of health. She got up normally  today. She did not have any nausea, dizziness, lightheadedness up until she started bleeding. She started bleeding around 2 p.m. after coming from church. She has been having normal bowel movements without melena or hematochezia.   REVIEW OF SYSTEMS: A 12 system review of systems is unable to be completed due to the patient's inability of answering questions at this moment. I spoke with the family, and they told me there has not been any fever. There have not been any changes of her weight. No shortness of breath up until now. Positive vomiting several times at home. Positive GI bleeding. No chest pain. No palpitations.   PAST MEDICAL HISTORY:  1. Diverticulosis with episodes of diverticulitis.  2. GI bleeding from the sigmoid colon.  3. Fibrocystic breast disease.  4. Apparently, the patient has been told she has hypertension but has never been started on medications as per the family.   SURGICAL HISTORY: Positive hysterectomy. Positive lumpectomy of the breast due to fibrocystic breast disease.   MEDICATIONS: The patient is not currently taking any oral medications.   ALLERGIES: No known drug allergies.   SOCIAL HISTORY: The patient is single. She lives with her mother. She has no kids. She does not work. She does not drink alcohol and does not smoke.   FAMILY HISTORY: Positive for hypertension and diabetes in her mom.   PHYSICAL EXAM:  VITAL SIGNS: Blood pressure  of 60s/40s, heart rate 73, respirations around 20 to 25 at this moment, temperature 97.6. Oxygen saturation dropped to the 70s. Right now, she is on 6 liters of oxygen at 100%.  GENERAL: The patient is awake but lethargic, not wanting to answer questions due to NG tube in her nose and also she is confused. She looks critically ill, and she is uncomfortable.  HEAD, EYES, EARS, NOSE, THROAT: Her pupils are equal and reactive. Extraocular movements are intact. Mucosa is dry. Anicteric sclerae. Pink conjunctivae. No oral lesions. No  oropharyngeal exudates.  NECK: Supple. No JVD. No thyromegaly. No adenopathy. No carotid bruits. No rigidity. The patient has an NG tube in the right naris secured with tape.  CARDIOVASCULAR: Regular rate and rhythm. No murmurs, rubs or gallops. No tenderness to palpation to anterior chest. No displacement of PMI.  LUNGS: Coarse in both bases, right more than left, likely due to aspiration as the patient had a large emesis while she was very lethargic. Positive use of accessory muscles. No dullness to percussion.  ABDOMEN: Soft, nontender, nondistended. No hepatosplenomegaly. No masses. Bowel sounds are positive.  GENITAL: Negative for external lesions.  EXTREMITIES: No edema. No cyanosis. No clubbing. Pulses +2. Signs of hypoperfusion with clammy skin and decreased capillary refill at around 4 to 5 seconds.  NEUROLOGIC: The patient is lethargic. She moves 4 extremities. She follows simple commands. Extraocular movements are intact. Cranial nerves grossly intact.  PSYCHIATRIC: Mood: Flat affect at this moment, and the patient is confused.  MUSCULOSKELETAL: No significant joint abnormalities or joint effusions.  LYMPHATIC: Negative for lymphadenopathy in the neck or supraclavicular area.   RESULTS: Glucose 191, BUN 26, , sodium 142, potassium 3.5. Lipase is 44. LFTs with a low albumin 3.1, AST 15, ALT 15. White count 9.5, hemoglobin 10.6, hematocrit 33, platelets 213. INR 1.1. Urinalysis: No nitrites, positive leukocyte esterase trace, protein 100, white blood cells 15. Likely, this is contaminated with blood and stool.   CHEST X-RAY: No significant signs of consolidation, although this was done prior to her large emesis and prior to being on oxygen, so likely now she has aspiration. We are going to repeat the chest x-ray in the morning as the patient is overall stable.   ASSESSMENT AND PLAN: This is a 69 year old female with history of previous gastrointestinal bleeding, diverticulosis,  diverticulitis in the past, possible hypertension but not on treatment who comes with hemorrhagic shock and significant hypotension and bleeding coming through her rectum acutely and rapidly.  1. Shock: The patient has hemorrhagic shock. The patient given intravenous fluids 4 liters and 1 transfusion of packed red blood cells O negative. The patient had improvement of the bleeding. At this moment, it has stopped, and the patient also has improvement of her blood pressure, now in the 110s/70s. The patient is going to be transferred to the critical care unit due to severity of the bleeding and the possibility of bleeding again. Shock is resolved at this moment after fluids for volume expansion and blood transfusion. We are going to do CBCs every 6 hours and transfuse if her hemoglobin is below 8 of if she starts bleeding and there is some significant hypotension or significant tachycardia.  2. Gastrointestinal bleeding: Dr. Gustavo Lah and Dr. Rexene Edison have been consulted, gastroenterology. Red blood cells packed and exam has been ordered. Despite the fact that this is not upper gastrointestinal, I am going to give a proton pump inhibitor for stress ulcers and monitor her hemoglobin closely.  3. Acute  respiratory failure: This is likely due to aspiration pneumonitis. The patient does not seem to have a pneumonic or infectious process. This was the results of altered mental status due to metabolic encephalopathy due to the bleeding and shock. The patient was confused and had a large emesis and after that started having coarseness of her respiratory sounds and significant increase of oxygen needs up to 6 liters. The patient is to be monitored closely in the critical care unit.  4. Possible history of hypertension: The patient is not taking medications for that. At this moment, it does not matter. We are not going to treat it since she was in shock.  5. Increased BUN: This is likely the result of the gastrointestinal  bleeding, blood digested in the gastrointestinal tract.  6. Hyperglycemia: The patient does not have a history of diabetes. We are going to check a hemoglobin A1c and check Accu-Cheks. If her Accu-Cheks are elevated more than 200, we are going to start her on insulin sliding scale and possibly on low-dose Lantus whenever she is eating but now she is n.p.o.  7. Other medical problems seem to be stable at the moment. The patient is at high risk of rebleeding, having shock and cardiovascular collapse with cardiorespiratory arrest and also significant worsening of her respiratory failure with potential intubation for which she is going to be watched very closely. The patient admitted to critical care unit.   Critical care time 45 minutes.   The patient is FULL CODE.    ____________________________ [place] Sink, MD rsg:gb D: 08/19/2012 20:29:51 ET T: 08/20/2012 01:23:59 ET JOB#: [id number]  cc:  Sink, MD, <Dictator> Jayr Lupercio America Brown MD ELECTRONICALLY SIGNED 08/28/2012 13:45

## 2014-11-07 NOTE — Discharge Summary (Signed)
PATIENT NAME:  Taylor Johnson, Taylor Johnson MR#:  [id number] DATE OF BIRTH:  [birth year]  DATE OF ADMISSION:  08/19/2012 DATE OF DISCHARGE:  08/23/2012  ADMITTING DIAGNOSIS: Bright red blood per rectum with clots.   DISCHARGE DIAGNOSES:  1.  Lower gastrointestinal bleeding, felt to be likely diverticular in nature, status post bleeding scan which was negative. Currently her hemoglobin and hematocrit is stable.  2.  Hypotension on presentation, felt to be due to possible shock due to acute gastrointestinal blood loss.  3.  Acute respiratory failure on presentation, felt to be due to aspiration pneumonitis. Her chest x-ray was negative. She was weaned off oxygen.  4.  Hyperglycemia with hemoglobin A1c of 6.3. Needs to monitor her diet.  5.  Hypoalbuminemia, likely due to poor protein caloric malnutrition.  6.  Previous history of diverticulosis with history of diverticulitis.  7.  History of diverticular bleed in the past.  8.  Fibrocystic breast disease.  9.  History of hypertension in the past. Blood pressure currently has been normal here.  10.  Possible vaginal bleed. Noted to have bacterial vaginosis, atrophy of menopause, status post evaluation by gynecology.   CONSULTANTS:  1.  Dr. Pat Patrick.  2.  Dr. Dionne Milo.  3.  Dr. Georga Bora.   HOSPITAL COURSE: Please refer to H and P done by the admitting physician. The patient is a 69 year old African American female with history of diverticulosis in the past who presented with acute onset of bright red blood mixed with clots. In the ED, she was noted to have blood pressure in the 60s. Was very lethargic. Was thought to have hemorrhagic shock as a result of her acute GI bleed. We were asked to admit the patient. The patient was admitted and had aggressive volume resuscitation. She was seen in consultation by surgery and GI. The patient was monitored and her hemoglobin and hematocrit were followed. She did have a previous colonoscopy in 2010 that showed  diverticulosis. The patient continued to have intermittent bleeding, therefore, underwent a bleeding scan which did not show any active source of her bleed. The patient over the past 48 hours has not had any further bleeding and hemoglobin has been stable. There was also some question whether she had some vaginal bleeding; therefore, a GYN consult was also obtained. They came and saw the patient and recommended treatment for vaginitis. The patient is currently doing well and has been cleared by GI for discharge. Her hemoglobin and blood pressure are all stable.   DISCHARGE MEDICATIONS: Flagyl 500 mg 1 tab p.o. q.12 x 5 days, fluconazole 150 mg 1 tab p.o. daily x 3 days, metronidazole topically 1 application at bedtime for 4 more days, esomeprazole 40 mg 1 tab p.o. daily.   DIET: Low fat, low cholesterol.   ACTIVITY: As tolerated.   REFERRALS: Home health and nursing.  FOLLOWUP CARE: Follow up with primary MD. Dr. Army Melia, in 1 to 2 weeks and in 2 to 4 weeks with Dr. Dionne Milo.   TIME SPENT: 35 minutes spent on the discharge.    ____________________________ Lafonda Mosses. Posey Pronto, MD shp:jm D: 08/23/2012 13:50:56 ET T: 08/23/2012 16:29:19 ET JOB#: [id number]  cc: Tomeika Weinmann H. Posey Pronto, MD, <Dictator> Alric Seton MD ELECTRONICALLY SIGNED 08/28/2012 10:16

## 2014-12-21 ENCOUNTER — Emergency Department: Payer: Medicare Other

## 2014-12-21 ENCOUNTER — Other Ambulatory Visit: Payer: Self-pay

## 2014-12-21 ENCOUNTER — Emergency Department
Admission: EM | Admit: 2014-12-21 | Discharge: 2014-12-22 | Disposition: A | Discharge status: Home / Self Care | Payer: Medicare Other | Attending: Emergency Medicine | Admitting: Emergency Medicine

## 2014-12-21 DIAGNOSIS — M25521 Pain in right elbow: Secondary | ICD-10-CM | POA: Diagnosis present

## 2014-12-21 DIAGNOSIS — M79601 Pain in right arm: Secondary | ICD-10-CM | POA: Diagnosis not present

## 2014-12-21 DIAGNOSIS — R42 Dizziness and giddiness: Secondary | ICD-10-CM | POA: Insufficient documentation

## 2014-12-21 MED ORDER — DIPHENHYDRAMINE HCL 50 MG/ML IJ SOLN
INTRAMUSCULAR | Status: AC
  Administered 2014-12-21: 12.5 mg via INTRAVENOUS
  Filled 2014-12-21: qty 1

## 2014-12-21 MED ORDER — METHYLPREDNISOLONE SODIUM SUCC 125 MG IJ SOLR
60.0000 mg | Freq: Once | INTRAMUSCULAR | Status: AC
  Administered 2014-12-21: 60 mg via INTRAVENOUS

## 2014-12-21 MED ORDER — DIPHENHYDRAMINE HCL 50 MG/ML IJ SOLN
12.5000 mg | Freq: Once | INTRAMUSCULAR | Status: AC
  Administered 2014-12-21: 12.5 mg via INTRAVENOUS

## 2014-12-21 MED ORDER — METHYLPREDNISOLONE SODIUM SUCC 125 MG IJ SOLR
INTRAMUSCULAR | Status: AC
  Administered 2014-12-21: 60 mg via INTRAVENOUS
  Filled 2014-12-21: qty 2

## 2014-12-21 NOTE — ED Provider Notes (Signed)
[facility] Emergency Department Provider Note  ____________________________________________  Time seen: 2235  I have reviewed the triage vital signs and the nursing notes.  Limited exam due to the patient's discomfort and lack of communication. Additional history from the sister.  HISTORY  Chief Complaint Elbow Pain  arm pain, possible insect bite    HPI Taylor Johnson is a 69 y.o. female who presents looking uncomfortable and agitated. She reports her right arm is hurting. When asked where she points to different focal spots. None of these areas appear to be tender to palpation. She appears distracted, somewhat agitated, she grimaces a great deal, but not on a consistent manner with the exam.  The patient reports that she thinks this is from being "bit by something on her car porch" last night. The patient's mother is in the room as well as her sister. The sister is in particular helpful with additional history.  The family reports that when the patient is actually speaking, and not grimacing or being silent with me, she is speaking in her usual manner.  Patient denies any changes in other extremities. She is moving her legs okay. She denies a headache.   No past medical history on file. History of anxiety.  There are no active problems to display for this patient.   No past surgical history on file.  No current outpatient prescriptions on file.  Allergies Review of patient's allergies indicates not on file.  No family history on file.  Social History History  Substance Use Topics  . Smoking status: Not on file  . Smokeless tobacco: Not on file  . Alcohol Use: Not on file    Review of Systems  Constitutional: Negative for fever. ENT: Negative for sore throat. Cardiovascular: Negative for chest pain. Respiratory: Negative for shortness of breath. Gastrointestinal: Negative for abdominal pain, vomiting and diarrhea. Genitourinary: Negative  for dysuria. Musculoskeletal: Pain in right arm. See history of present illness Skin: Negative for rash. Neurological: Negative for headaches   10-point ROS otherwise negative.  ____________________________________________   PHYSICAL EXAM:  VITAL SIGNS: ED Triage Vitals  Enc Vitals Group     BP 12/21/14 2220 168/89 mmHg     Pulse Rate 12/21/14 2220 82     Resp --      Temp 12/21/14 2220 97.8 F (36.6 C)     Temp Source 12/21/14 2220 Oral     SpO2 12/21/14 2220 97 %     Weight 12/21/14 2220 287 lb 11.2 oz (130.5 kg)     Height 12/21/14 2220 5\' 4"  (1.626 m)     Head Cir --      Peak Flow --      Pain Score --      Pain Loc --      Pain Edu? --      Excl. in [place]? --     Constitutional: Alert but appears uncomfortable, somewhat agitated, grimacing quite a bit, and limits her clear direct answers to certain questions. ENT   Head: Normocephalic and atraumatic.   Nose: No congestion/rhinnorhea.   Mouth/Throat: Mucous membranes are moist. Cardiovascular: Normal rate, regular rhythm. Respiratory: Normal respiratory effort without tachypnea. Breath sounds are clear and equal bilaterally. No wheezes/rales/rhonchi. Gastrointestinal: Soft and nontender. No distention.  Back: No muscle spasm, no tenderness, no CVA tenderness. Musculoskeletal: All the patient points to a specific area or on her anterior elbow, I find no point of actual tenderness. There is no focal tenderness on the bony aspects  of the elbow either. She grimaced on and off during parts of the exam. When the patient's sister engaged her in conversation asking if the elbow hurt during the exam, the patient stopped grimacing in the sister. at that time i aggressively palpated the entire arm through the forearm elbow and biceps and found no areas of tenderness. the patient is able to move in a fairly normal range of motion. she is able to touch her nose with her finger. she has a very good grip on the right. she is an  excellent pulse. Neurologic:  Odd affect and communication but neurologically intact with intact sensation and motor function in all 4 extremities. Skin:  Skin is warm, dry.  There is one very small red mark consistent with a small bug bite on the right lateral chest. There is no large area of erythema, there is no palpable nodule or other finding to help explain the pain in her right arm.[name] Psychiatric: As noted, odd affect with some limited communication. She avoids answering certain questions but simply grimaces and shakes her head. This does not appear to be a grimace or reaction to any particularly tender spot on her arm..  ____________________________________________    LABS (pertinent positives/negatives)  Pending  ____________________________________________   EKG  ED ECG REPORT I, Nicolo Tomko W, the attending physician, personally viewed and interpreted this ECG.   Date: 12/21/2014  EKG Time: 2219  Rate: 85  Rhythm: Normal sinus rhythm  Axis: Normal  Intervals: Normal  ST&T Change: Downgoing T waves in V4 through V6   ____________________________________________    RADIOLOGY  Pending  ____________________________________________    ____________________________________________   INITIAL IMPRESSION / ASSESSMENT AND PLAN / ED COURSE  Unclear cause for this patient's distress. As noted, examination with distraction does not find any area that is tender in a focal manner. She has good motor function and sensation. I find her communication and affect most strange. I have ordered blood tests, a chest x-ray, and a head CT. I will last my colleague Dr. Joni Fears to follow up on these studies and reassess the patient. The patient and her family have been informed of this transition and care.  ____________________________________________   FINAL CLINICAL IMPRESSION(S) / ED DIAGNOSES  Final diagnoses:  Pain of right arm      Ahmed Prima, MD 12/21/14  2301

## 2014-12-21 NOTE — ED Notes (Signed)
Poor historian. Unable to say when she felt dizzy or when her arm started hurting.

## 2014-12-22 DIAGNOSIS — M79601 Pain in right arm: Secondary | ICD-10-CM | POA: Diagnosis not present

## 2014-12-22 LAB — COMPREHENSIVE METABOLIC PANEL
ALT: 12 U/L — ABNORMAL LOW (ref 14–54)
AST: 16 U/L (ref 15–41)
Albumin: 3.6 g/dL (ref 3.5–5.0)
Alkaline Phosphatase: 87 U/L (ref 38–126)
Anion gap: 8 (ref 5–15)
BUN: 18 mg/dL (ref 6–20)
CO2: 29 mmol/L (ref 22–32)
Calcium: 9.1 mg/dL (ref 8.9–10.3)
Chloride: 102 mmol/L (ref 101–111)
Creatinine, Ser: 1 mg/dL (ref 0.44–1.00)
GFR calc Af Amer: >60 mL/min (ref >60)
GFR calc non Af Amer: 57 mL/min — ABNORMAL LOW (ref >60)
Glucose, Bld: 129 mg/dL — ABNORMAL HIGH (ref 65–99)
Potassium: 3.8 mmol/L (ref 3.5–5.1)
Sodium: 139 mmol/L (ref 135–145)
Total Bilirubin: 0.5 mg/dL (ref 0.3–1.2)
Total Protein: 7.7 g/dL (ref 6.5–8.1)

## 2014-12-22 LAB — CBC WITH DIFFERENTIAL/PLATELET
Basophils Absolute: 0.1 10*3/uL (ref 0–0.1)
Basophils Relative: 1 %
Eosinophils Absolute: 0.1 10*3/uL (ref 0–0.7)
Eosinophils Relative: 1 %
HCT: 38.3 % (ref 35.0–47.0)
Hemoglobin: 12 g/dL (ref 12.0–16.0)
Lymphocytes Relative: 11 %
Lymphs Abs: 1.1 10*3/uL (ref 1.0–3.6)
MCH: 27.6 pg (ref 26.0–34.0)
MCHC: 31.3 g/dL — ABNORMAL LOW (ref 32.0–36.0)
MCV: 88.2 fL (ref 80.0–100.0)
Monocytes Absolute: 0.7 10*3/uL (ref 0.2–0.9)
Monocytes Relative: 7 %
Neutro Abs: 8 10*3/uL — ABNORMAL HIGH (ref 1.4–6.5)
Neutrophils Relative %: 80 %
Platelets: 203 10*3/uL (ref 150–440)
RBC: 4.35 MIL/uL (ref 3.80–5.20)
RDW: 15.7 % — ABNORMAL HIGH (ref 11.5–14.5)
WBC: 9.9 10*3/uL (ref 3.6–11.0)

## 2014-12-22 LAB — TROPONIN I: Troponin I: <0.03 ng/mL (ref <0.031)

## 2014-12-22 MED ORDER — DIPHENHYDRAMINE HCL 25 MG PO CAPS: 50.0000 mg | ORAL_CAPSULE | Freq: Four times a day (QID) | ORAL | Status: DC | PRN

## 2014-12-22 NOTE — ED Provider Notes (Signed)
-----------------------------------------   3:25 AM on 12/22/2014 -----------------------------------------  Blood tests chest x-ray CT head all unremarkable. Patient is sleeping comfortably. I reexamined the patient and find that the elbow is completely nontender with full range of motion and no swelling. Additionally the family is concerned that the patient is having some swelling around the eyes, but the periorbital soft tissues are soft without edema or discoloration. There is no conjunctivitis or subconjunctival hemorrhage. No evidence of orbital cellulitis.  Per discussion with family and Dr. Thomasene Lot the patient is likely at her mental status baseline. There is no evidence of any acute pathology or neurologic lesion. We will plan to discharge her home and have her follow up with primary care and take Benadryl as needed for her discomfort from insect bites on the thorax.  Carrie Mew, MD 12/22/14 [phone]

## 2014-12-22 NOTE — Discharge Instructions (Signed)
Pain of Unknown Etiology (Pain Without a Known Cause) °You have come to your caregiver because of pain. Pain can occur in any part of the body. Often there is not a definite cause. If your laboratory (blood or urine) work was normal and X-rays or other studies were normal, your caregiver may treat you without knowing the cause of the pain. An example of this is the headache. Most headaches are diagnosed by taking a history. This means your caregiver asks you questions about your headaches. Your caregiver determines a treatment based on your answers. Usually testing done for headaches is normal. Often testing is not done unless there is no response to medications. Regardless of where your pain is located today, you can be given medications to make you comfortable. If no physical cause of pain can be found, most cases of pain will gradually leave as suddenly as they came.  °If you have a painful condition and no reason can be found for the pain, it is important that you follow up with your caregiver. If the pain becomes worse or does not go away, it may be necessary to repeat tests and look further for a possible cause. °· Only take over-the-counter or prescription medicines for pain, discomfort, or fever as directed by your caregiver. °· For the protection of your privacy, test results cannot be given over the phone. Make sure you receive the results of your test. Ask how these results are to be obtained if you have not been informed. It is your responsibility to obtain your test results. °· You may continue all activities unless the activities cause more pain. When the pain lessens, it is important to gradually resume normal activities. Resume activities by beginning slowly and gradually increasing the intensity and duration of the activities or exercise. During periods of severe pain, bed rest may be helpful. Lie or sit in any position that is comfortable. °· Ice used for acute (sudden) conditions may be effective.  Use a large plastic bag filled with ice and wrapped in a towel. This may provide pain relief. °· See your caregiver for continued problems. Your caregiver can help or refer you for exercises or physical therapy if necessary. °If you were given medications for your condition, do not drive, operate machinery or power tools, or sign legal documents for 24 hours. Do not drink alcohol, take sleeping pills, or take other medications that may interfere with treatment. °See your caregiver immediately if you have pain that is becoming worse and not relieved by medications. °Document Released: 03/29/2001 Document Revised: 04/24/2013 Document Reviewed: 07/04/2005 °[employer]® Patient Information ©2015 [employer]. This information is not intended to replace advice given to you by your health care provider. Make sure you discuss any questions you have with your health care provider. ° °

## 2015-01-01 ENCOUNTER — Other Ambulatory Visit: Payer: Self-pay | Admitting: Internal Medicine

## 2015-04-25 ENCOUNTER — Emergency Department: Payer: Medicare Other

## 2015-04-25 ENCOUNTER — Encounter: Payer: Self-pay | Admitting: *Deleted

## 2015-04-25 ENCOUNTER — Emergency Department
Admission: EM | Admit: 2015-04-25 | Discharge: 2015-04-25 | Disposition: A | Discharge status: Home / Self Care | Payer: Medicare Other | Attending: Emergency Medicine | Admitting: Emergency Medicine

## 2015-04-25 DIAGNOSIS — R0602 Shortness of breath: Secondary | ICD-10-CM | POA: Diagnosis not present

## 2015-04-25 DIAGNOSIS — R0600 Dyspnea, unspecified: Secondary | ICD-10-CM | POA: Diagnosis not present

## 2015-04-25 DIAGNOSIS — R1084 Generalized abdominal pain: Secondary | ICD-10-CM | POA: Insufficient documentation

## 2015-04-25 DIAGNOSIS — Z79899 Other long term (current) drug therapy: Secondary | ICD-10-CM | POA: Diagnosis not present

## 2015-04-25 DIAGNOSIS — R109 Unspecified abdominal pain: Secondary | ICD-10-CM | POA: Diagnosis present

## 2015-04-25 HISTORY — DX: Unspecified lack of expected normal physiological development in childhood: R62.50

## 2015-04-25 HISTORY — DX: Diverticulitis of intestine, part unspecified, without perforation or abscess without bleeding: K57.92

## 2015-04-25 HISTORY — DX: Anxiety disorder, unspecified: F41.9

## 2015-04-25 LAB — COMPREHENSIVE METABOLIC PANEL
ALT: 11 U/L — ABNORMAL LOW (ref 14–54)
AST: 15 U/L (ref 15–41)
Albumin: 3.9 g/dL (ref 3.5–5.0)
Alkaline Phosphatase: 90 U/L (ref 38–126)
Anion gap: 4 — ABNORMAL LOW (ref 5–15)
BUN: 14 mg/dL (ref 6–20)
CO2: 31 mmol/L (ref 22–32)
Calcium: 9.3 mg/dL (ref 8.9–10.3)
Chloride: 104 mmol/L (ref 101–111)
Creatinine, Ser: 0.96 mg/dL (ref 0.44–1.00)
GFR calc Af Amer: >60 mL/min (ref >60)
GFR calc non Af Amer: 59 mL/min — ABNORMAL LOW (ref >60)
Glucose, Bld: 116 mg/dL — ABNORMAL HIGH (ref 65–99)
Potassium: 4 mmol/L (ref 3.5–5.1)
Sodium: 139 mmol/L (ref 135–145)
Total Bilirubin: 0.5 mg/dL (ref 0.3–1.2)
Total Protein: 7.9 g/dL (ref 6.5–8.1)

## 2015-04-25 LAB — CBC
HCT: 38 % (ref 35.0–47.0)
Hemoglobin: 12.1 g/dL (ref 12.0–16.0)
MCH: 27.8 pg (ref 26.0–34.0)
MCHC: 31.8 g/dL — ABNORMAL LOW (ref 32.0–36.0)
MCV: 87.5 fL (ref 80.0–100.0)
Platelets: 208 10*3/uL (ref 150–440)
RBC: 4.34 MIL/uL (ref 3.80–5.20)
RDW: 16.1 % — ABNORMAL HIGH (ref 11.5–14.5)
WBC: 8.2 10*3/uL (ref 3.6–11.0)

## 2015-04-25 LAB — URINALYSIS COMPLETE WITH MICROSCOPIC (ARMC ONLY)
Bilirubin Urine: NEGATIVE
Glucose, UA: NEGATIVE mg/dL
Ketones, ur: NEGATIVE mg/dL
Nitrite: NEGATIVE
Protein, ur: NEGATIVE mg/dL
Specific Gravity, Urine: 1.006 (ref 1.005–1.030)
pH: 6 (ref 5.0–8.0)

## 2015-04-25 LAB — LIPASE, BLOOD: Lipase: 14 U/L — ABNORMAL LOW (ref 22–51)

## 2015-04-25 LAB — TROPONIN I: Troponin I: <0.03 ng/mL (ref <0.031)

## 2015-04-25 MED ORDER — IOHEXOL 240 MG/ML SOLN
25.0000 mL | Freq: Once | INTRAMUSCULAR | Status: AC | PRN
  Administered 2015-04-25: 25 mL via ORAL

## 2015-04-25 MED ORDER — SODIUM CHLORIDE 0.9 % IV BOLUS (SEPSIS): 500.0000 mL | Freq: Once | INTRAVENOUS | Status: DC

## 2015-04-25 MED ORDER — IOHEXOL 300 MG/ML  SOLN
125.0000 mL | Freq: Once | INTRAMUSCULAR | Status: AC | PRN
  Administered 2015-04-25: 125 mL via INTRAVENOUS

## 2015-04-25 NOTE — ED Notes (Signed)
Pt presents w/ c/o intermittent peri-umbilical abdominal pain. Pt also reported shortness of breath to the sister who brought her to the ER. Pt states now her stomach doesn't hurt, instead feels "bubbly". Pt in no acute respiratory distress at this time but still indicates that she is short of breath, reported shortness of breath when lying down in bed. Pt denies n/v/d, denies dysuria and fever. Pt is a poor historian.

## 2015-04-25 NOTE — ED Notes (Signed)
Pt reports periumbilical abdominal pain starting last night. Pt reports headaches starting last night also. Pt states " the pain is worse when I lay down."

## 2015-04-25 NOTE — ED Provider Notes (Signed)
[facility] Emergency Department Provider Note  ____________________________________________  Time seen: Approximately 6 AM  I have reviewed the triage vital signs and the nursing notes.   HISTORY  Chief Complaint Abdominal Pain    HPI Taylor Johnson is a 69 y.o. female with a history of diverticulitis who is presenting today with periumbilical abdominal pain over the past 24 hours. She says the pain is coming and going and is been moderate to severe. It is not associated with diarrhea and she had a bowel movement which was normal earlier today. She does not nose any blood in her stool. She denies any nausea or vomiting. Says the pain completely relieved itself once she arrived to the emergency department. Is also complaining of shortness of breath over the past 3 months which is not worsened with exertion. She says that the abdominal pain and the shortness of breath have been episodic. They resolve spontaneously and there are no exacerbating factors. She denies shortness of breath or any chest pain at this time. No burning with urination.   Past Medical History  Diagnosis Date  . Diverticulitis   . Anxiety   . Developmental delay     There are no active problems to display for this patient.   History reviewed. No pertinent past surgical history.  Current Outpatient Rx  Name  Route  Sig  Dispense  Refill  . diphenhydrAMINE (BENADRYL) 25 mg capsule   Oral   Take 2 capsules (50 mg total) by mouth every 6 (six) hours as needed.   60 capsule   0   . escitalopram (LEXAPRO) 10 MG tablet      TAKE 1 TABLET BY MOUTH EACH DAY   30 tablet   3     Allergies Review of patient's allergies indicates no known allergies.  History reviewed. No pertinent family history.  Social History Social History  Substance Use Topics  . Smoking status: Never Smoker   . Smokeless tobacco: Never Used  . Alcohol Use: No    Review of Systems Constitutional: No  fever/chills Eyes: No visual changes. ENT: No sore throat. Cardiovascular: Denies chest pain. Respiratory: As above Gastrointestinal:  No nausea, no vomiting.  No diarrhea.  No constipation. Genitourinary: Negative for dysuria. Musculoskeletal: Negative for back pain. Skin: Negative for rash. Neurological: Negative for headaches, focal weakness or numbness.  10-point ROS otherwise negative.  ____________________________________________   PHYSICAL EXAM:  VITAL SIGNS: ED Triage Vitals  Enc Vitals Group     BP 04/25/15 0253 181/70 mmHg     Pulse Rate 04/25/15 0253 63     Resp 04/25/15 0253 20     Temp 04/25/15 0253 97.7 F (36.5 C)     Temp Source 04/25/15 0253 Oral     SpO2 04/25/15 0253 99 %     Weight --      Height --      Head Cir --      Peak Flow --      Pain Score --      Pain Loc --      Pain Edu? --      Excl. in [place]? --     Constitutional: Alert and oriented. Well appearing and in no acute distress. Eyes: Conjunctivae are normal. PERRL. EOMI. Head: Atraumatic. Nose: No congestion/rhinnorhea. Mouth/Throat: Mucous membranes are moist.  Oropharynx non-erythematous. Neck: No stridor.   Cardiovascular: Normal rate, regular rhythm. Grossly normal heart sounds.  Good peripheral circulation. Respiratory: Normal respiratory effort.  No retractions.  Lungs CTAB. Gastrointestinal: Soft and no tenderness per the patient. However, she is wincing when I palpate throughout the abdomen. No rebound or guarding.. No distention. No abdominal bruits. No CVA tenderness. Musculoskeletal: Mild bilateral lower extremity edema which is chronic for this patient.  Neurologic:  Normal speech and language. No gross focal neurologic deficits are appreciated. No gait instability. Skin:  Skin is warm, dry and intact. No rash noted. Psychiatric: Mood and affect are normal. Speech and behavior are normal.  ____________________________________________   LABS (all labs ordered are listed,  but only abnormal results are displayed)  Labs Reviewed  LIPASE, BLOOD - Abnormal; Notable for the following:    Lipase 14 (*)    All other components within normal limits  COMPREHENSIVE METABOLIC PANEL - Abnormal; Notable for the following:    Glucose, Bld 116 (*)    ALT 11 (*)    GFR calc non Af Amer 59 (*)    Anion gap 4 (*)    All other components within normal limits  CBC - Abnormal; Notable for the following:    MCHC 31.8 (*)    RDW 16.1 (*)    All other components within normal limits  URINALYSIS COMPLETEWITH MICROSCOPIC ([facility] ONLY) - Abnormal; Notable for the following:    Color, Urine STRAW (*)    APPearance CLEAR (*)    Hgb urine dipstick 1+ (*)    Leukocytes, UA TRACE (*)    Bacteria, UA MANY (*)    Squamous Epithelial / LPF 0-5 (*)    All other components within normal limits  URINE CULTURE  TROPONIN I   ____________________________________________  EKG  ED ECG REPORT I, Doran Stabler, the attending physician, personally viewed and interpreted this ECG.   Date: 04/25/2015  EKG Time: 617  Rate: 58  Rhythm: sinus bradycardia  Axis: Normal axis  Intervals:none  ST&T Change: T wave inversions in 1, 2, aVL, V3 through 6. No ST elevations or depressions.  Only obvious change to this EKG is the new V3 T-wave inversion. This may be related to lead placement. ____________________________________________  RADIOLOGY  Chest x-ray without any active cardiopulmonary disease. I personally reviewed this film.  No acute findings on the CT of the abdomen and pelvis. The patient does have diverticulosis as well as fibroids. ____________________________________________   PROCEDURES   ____________________________________________   INITIAL IMPRESSION / ASSESSMENT AND PLAN / ED COURSE  Pertinent labs & imaging results that were available during my care of the patient were reviewed by me and considered in my medical decision making (see chart for  details).  ----------------------------------------- 7:40 AM on 04/25/2015 -----------------------------------------  Patient is resting comfortably at this time and still denying any abdominal pain. I did do a thorough evaluation including for cardiac disease as well as abdominal pathology because the patient did not appear to be clearly forthcoming with her history and had a challenging physical exam with her denying pain but still wincing when I push on her abdomen. Her workup was reassuring. Her sister is at the bedside and says that her shortness of breath has been ongoing for 3 months. Despite this, she had a large unchanged EKG and a negative troponin. We discussed this CAT scan of the abdomen including the diverticulosis and the fibroids. The patient will follow up with her primary care doctor now given the follow-up information for OB/GYN. Will be discharged home. I discussed the plan with the patient as well as her sister who understand and are willing to  comply. They understand the need to return for any worsening or concerning symptoms. ____________________________________________   FINAL CLINICAL IMPRESSION(S) / ED DIAGNOSES  Acute abdominal pain. Chronic shortness of breath.    Orbie Pyo, MD 04/25/15 [phone]

## 2015-04-25 NOTE — Discharge Instructions (Signed)

## 2015-04-27 LAB — URINE CULTURE

## 2015-05-11 ENCOUNTER — Encounter: Payer: Self-pay | Admitting: Internal Medicine

## 2015-05-11 DIAGNOSIS — K5730 Diverticulosis of large intestine without perforation or abscess without bleeding: Secondary | ICD-10-CM

## 2015-05-11 DIAGNOSIS — F411 Generalized anxiety disorder: Secondary | ICD-10-CM | POA: Insufficient documentation

## 2015-05-11 HISTORY — DX: Diverticulosis of large intestine without perforation or abscess without bleeding: K57.30

## 2015-05-11 HISTORY — DX: Generalized anxiety disorder: F41.1

## 2015-05-12 ENCOUNTER — Ambulatory Visit (INDEPENDENT_AMBULATORY_CARE_PROVIDER_SITE_OTHER): Payer: Medicare Other | Admitting: Internal Medicine

## 2015-05-12 ENCOUNTER — Encounter: Payer: Self-pay | Admitting: Internal Medicine

## 2015-05-12 VITALS — BP 164/70 | HR 72 | Ht 64.0 in | Wt 274.8 lb

## 2015-05-12 DIAGNOSIS — R197 Diarrhea, unspecified: Secondary | ICD-10-CM

## 2015-05-12 DIAGNOSIS — R059 Cough, unspecified: Secondary | ICD-10-CM

## 2015-05-12 DIAGNOSIS — Z23 Encounter for immunization: Secondary | ICD-10-CM | POA: Diagnosis not present

## 2015-05-12 DIAGNOSIS — F411 Generalized anxiety disorder: Secondary | ICD-10-CM

## 2015-05-12 DIAGNOSIS — R05 Cough: Secondary | ICD-10-CM

## 2015-05-12 DIAGNOSIS — N3941 Urge incontinence: Secondary | ICD-10-CM

## 2015-05-12 HISTORY — DX: Urge incontinence: N39.41

## 2015-05-12 MED ORDER — TOLTERODINE TARTRATE ER 4 MG PO CP24: 4.0000 mg | ORAL_CAPSULE | Freq: Every day | ORAL | Status: DC

## 2015-05-12 MED ORDER — ESCITALOPRAM OXALATE 10 MG PO TABS: ORAL_TABLET | ORAL | Status: DC

## 2015-05-12 NOTE — Patient Instructions (Addendum)
Citrucil or Metamucil  smooth fiber supplement - mix one dose into a glass of water or juice and drink every morning.

## 2015-05-12 NOTE — Progress Notes (Signed)
Date:  05/12/2015   Name:  Taylor Johnson   DOB:  1946-04-27   MRN:  [id number]   Chief Complaint: Urinary Incontinence HPI Incontinence - the sister here today with the patient complaining of increased incontinence of urine. Symptoms have been present for a long time but gradually worsening. There is no evidence of infection as the patient denies burning and the family has not noticed odor or blood. She wears depends regularly.  Loose stools Patient has a history of diverticulosis. Her family has noticed more frequent loose stools 3-4 times per day. There has not been any incontinence. There has not been any blood. She had a colonoscopy about a year ago which showed only diverticulosis. Review of medications does not reveal any possible causes.  Cough - family has noticed an intermittent cough that sounds like something is stuck in the patient's throat. When this occurs she'll have a coughing spell for a minute or 2 and that resolves after drinking water. It never occurs while eating. It never occurs while sleeping. Episodes about once per week. The patient denies sore throat, wheezing, or shortness of breath at rest. She does feel short of breath if she exerts herself.  Anxiety - she is doing well on Lexapro. She still wants to stay home but the family has plans to try to get her into a daycare program a couple of days a week.  Appetite is good and she sleeps well.  Review of Systems  HENT: Negative for sinus pressure, sore throat, tinnitus and trouble swallowing.   Respiratory: Positive for cough and shortness of breath (with exertion). Negative for chest tightness.   Cardiovascular: Negative for chest pain and palpitations.  Gastrointestinal: Positive for diarrhea. Negative for abdominal pain, blood in stool and abdominal distention.  Genitourinary: Positive for urgency and difficulty urinating (frequency and incontinence.). Negative for dysuria, hematuria and decreased urine volume.   Musculoskeletal: Negative for gait problem.  Skin: Negative for rash.  Allergic/Immunologic: Negative for environmental allergies and food allergies.    Patient Active Problem List   Diagnosis Date Noted  . Diverticulitis of colon with bleeding 05/11/2015  . Anxiety, generalized 05/11/2015    Prior to Admission medications   Medication Sig Start Date End Date Taking? Authorizing Provider  diphenhydrAMINE (BENADRYL) 25 mg capsule Take 2 capsules (50 mg total) by mouth every 6 (six) hours as needed. 12/22/14  Yes Carrie Mew, MD  escitalopram (LEXAPRO) 10 MG tablet TAKE 1 TABLET BY MOUTH EACH DAY 01/01/15  Yes [name], MD    No Known Allergies  Past Surgical History  Procedure Laterality Date  . Abdominal hysterectomy      bleeding    Social History  Substance Use Topics  . Smoking status: Never Smoker   . Smokeless tobacco: Never Used  . Alcohol Use: No     Medication list has been reviewed and updated.   Physical Exam  Constitutional: She appears well-developed and well-nourished.  HENT:  Right Ear: Tympanic membrane and ear canal normal.  Left Ear: Tympanic membrane and ear canal normal.  Nose: Nose normal.  Mouth/Throat: Uvula is midline and oropharynx is clear and moist.  Eyes: Pupils are equal, round, and reactive to light.  Neck: Normal range of motion. Neck supple. No thyromegaly present.  Cardiovascular: Normal rate, regular rhythm, normal heart sounds and normal pulses.   Pulmonary/Chest: Effort normal and breath sounds normal.  Abdominal: Soft. Normal appearance. There is no tenderness.  Psychiatric: Her speech is normal. Her affect  is blunt.  Nursing note and vitals reviewed.   BP 164/70 mmHg  Pulse 72  Ht 5\' 4"  (1.626 m)  Wt 274 lb 12.8 oz (124.648 kg)  BMI 47.15 kg/m2  Assessment and Plan: 1. Flu vaccine need - Flu Vaccine QUAD 36+ mos PF IM (Fluarix & Fluzone Quad PF)  2. Urge incontinence of urine Begin medication and monitor  for improvement - tolterodine (DETROL LA) 4 MG 24 hr capsule; Take 1 capsule (4 mg total) by mouth daily.  Dispense: 30 capsule; Refill: 5  3. Frequent loose stools Recommend Metamucil or Citrucel fiber supplement daily  4. Anxiety, generalized Doing well on Lexapro - escitalopram (LEXAPRO) 10 MG tablet; TAKE 1 TABLET BY MOUTH EACH DAY  Dispense: 30 tablet; Refill: 5  5. Cough Sounds benign; family will monitor for worsening Consider trial of antihistamine for possible allergic cough  Halina Maidens, MD [facility]  05/12/2015

## 2015-05-13 ENCOUNTER — Other Ambulatory Visit: Payer: Self-pay | Admitting: Internal Medicine

## 2015-05-13 MED ORDER — TOLTERODINE TARTRATE 2 MG PO TABS: 2.0000 mg | ORAL_TABLET | Freq: Two times a day (BID) | ORAL | Status: DC

## 2015-05-18 ENCOUNTER — Other Ambulatory Visit: Payer: Self-pay | Admitting: Internal Medicine

## 2015-05-18 MED ORDER — SOLIFENACIN SUCCINATE 5 MG PO TABS: 5.0000 mg | ORAL_TABLET | Freq: Every day | ORAL | Status: DC

## 2015-09-12 ENCOUNTER — Emergency Department: Payer: Medicare Other

## 2015-09-12 ENCOUNTER — Inpatient Hospital Stay
Admission: EM | Admit: 2015-09-12 | Discharge: 2015-09-13 | DRG: 205 | Disposition: A | Discharge status: Home / Self Care | Payer: Medicare Other | Attending: Internal Medicine | Admitting: Internal Medicine

## 2015-09-12 DIAGNOSIS — R112 Nausea with vomiting, unspecified: Secondary | ICD-10-CM | POA: Diagnosis present

## 2015-09-12 DIAGNOSIS — J9811 Atelectasis: Secondary | ICD-10-CM

## 2015-09-12 DIAGNOSIS — R0902 Hypoxemia: Secondary | ICD-10-CM

## 2015-09-12 DIAGNOSIS — Z8249 Family history of ischemic heart disease and other diseases of the circulatory system: Secondary | ICD-10-CM | POA: Diagnosis not present

## 2015-09-12 DIAGNOSIS — Z23 Encounter for immunization: Secondary | ICD-10-CM | POA: Diagnosis not present

## 2015-09-12 DIAGNOSIS — F411 Generalized anxiety disorder: Secondary | ICD-10-CM | POA: Diagnosis present

## 2015-09-12 DIAGNOSIS — Z79899 Other long term (current) drug therapy: Secondary | ICD-10-CM

## 2015-09-12 DIAGNOSIS — R1084 Generalized abdominal pain: Secondary | ICD-10-CM | POA: Diagnosis present

## 2015-09-12 DIAGNOSIS — R6250 Unspecified lack of expected normal physiological development in childhood: Secondary | ICD-10-CM | POA: Diagnosis present

## 2015-09-12 DIAGNOSIS — R109 Unspecified abdominal pain: Secondary | ICD-10-CM

## 2015-09-12 DIAGNOSIS — J189 Pneumonia, unspecified organism: Secondary | ICD-10-CM | POA: Diagnosis present

## 2015-09-12 HISTORY — DX: Hypoxemia: R09.02

## 2015-09-12 LAB — TROPONIN I
Troponin I: <0.03 ng/mL (ref <0.031)
Troponin I: <0.03 ng/mL (ref <0.031)

## 2015-09-12 LAB — BASIC METABOLIC PANEL
Anion gap: 7 (ref 5–15)
BUN: 20 mg/dL (ref 6–20)
CO2: 31 mmol/L (ref 22–32)
Calcium: 9.1 mg/dL (ref 8.9–10.3)
Chloride: 101 mmol/L (ref 101–111)
Creatinine, Ser: 0.98 mg/dL (ref 0.44–1.00)
GFR calc Af Amer: >60 mL/min (ref >60)
GFR calc non Af Amer: 58 mL/min — ABNORMAL LOW (ref >60)
Glucose, Bld: 160 mg/dL — ABNORMAL HIGH (ref 65–99)
Potassium: 3.8 mmol/L (ref 3.5–5.1)
Sodium: 139 mmol/L (ref 135–145)

## 2015-09-12 LAB — CBC
HCT: 36.6 % (ref 35.0–47.0)
Hemoglobin: 11.9 g/dL — ABNORMAL LOW (ref 12.0–16.0)
MCH: 28 pg (ref 26.0–34.0)
MCHC: 32.5 g/dL (ref 32.0–36.0)
MCV: 86.2 fL (ref 80.0–100.0)
Platelets: 214 10*3/uL (ref 150–440)
RBC: 4.25 MIL/uL (ref 3.80–5.20)
RDW: 16.2 % — ABNORMAL HIGH (ref 11.5–14.5)
WBC: 7.9 10*3/uL (ref 3.6–11.0)

## 2015-09-12 LAB — URINALYSIS COMPLETE WITH MICROSCOPIC (ARMC ONLY)
Bilirubin Urine: NEGATIVE
Glucose, UA: NEGATIVE mg/dL
Ketones, ur: NEGATIVE mg/dL
Leukocytes, UA: NEGATIVE
Nitrite: NEGATIVE
Protein, ur: NEGATIVE mg/dL
Specific Gravity, Urine: 1.005 (ref 1.005–1.030)
pH: 6 (ref 5.0–8.0)

## 2015-09-12 LAB — HEPATIC FUNCTION PANEL
ALT: 11 U/L — ABNORMAL LOW (ref 14–54)
AST: 15 U/L (ref 15–41)
Albumin: 4 g/dL (ref 3.5–5.0)
Alkaline Phosphatase: 78 U/L (ref 38–126)
Bilirubin, Direct: <0.1 mg/dL — ABNORMAL LOW (ref 0.1–0.5)
Total Bilirubin: 0.1 mg/dL — ABNORMAL LOW (ref 0.3–1.2)
Total Protein: 7.9 g/dL (ref 6.5–8.1)

## 2015-09-12 LAB — LIPASE, BLOOD: Lipase: 19 U/L (ref 11–51)

## 2015-09-12 MED ORDER — ACETAMINOPHEN 325 MG PO TABS: 650.0000 mg | ORAL_TABLET | Freq: Four times a day (QID) | ORAL | Status: DC | PRN

## 2015-09-12 MED ORDER — HYDROCODONE-ACETAMINOPHEN 5-325 MG PO TABS
1.0000 | ORAL_TABLET | ORAL | Status: DC | PRN
  Administered 2015-09-12: 1 via ORAL
  Filled 2015-09-12: qty 1

## 2015-09-12 MED ORDER — MORPHINE SULFATE (PF) 4 MG/ML IV SOLN
4.0000 mg | Freq: Once | INTRAVENOUS | Status: AC
  Administered 2015-09-12: 4 mg via INTRAVENOUS
  Filled 2015-09-12: qty 1

## 2015-09-12 MED ORDER — ESCITALOPRAM OXALATE 10 MG PO TABS
10.0000 mg | ORAL_TABLET | Freq: Every day | ORAL | Status: DC
  Administered 2015-09-12 – 2015-09-13 (×2): 10 mg via ORAL
  Filled 2015-09-12 (×2): qty 1

## 2015-09-12 MED ORDER — ACETAMINOPHEN 650 MG RE SUPP: 650.0000 mg | Freq: Four times a day (QID) | RECTAL | Status: DC | PRN

## 2015-09-12 MED ORDER — PNEUMOCOCCAL VAC POLYVALENT 25 MCG/0.5ML IJ INJ
0.5000 mL | INJECTION | INTRAMUSCULAR | Status: AC
  Administered 2015-09-13: 0.5 mL via INTRAMUSCULAR
  Filled 2015-09-12: qty 0.5

## 2015-09-12 MED ORDER — ALUM & MAG HYDROXIDE-SIMETH 200-200-20 MG/5ML PO SUSP: 30.0000 mL | Freq: Four times a day (QID) | ORAL | Status: DC | PRN

## 2015-09-12 MED ORDER — IOHEXOL 300 MG/ML  SOLN
125.0000 mL | Freq: Once | INTRAMUSCULAR | Status: AC | PRN
  Administered 2015-09-12: 125 mL via INTRAVENOUS

## 2015-09-12 MED ORDER — ENOXAPARIN SODIUM 40 MG/0.4ML ~~LOC~~ SOLN
40.0000 mg | SUBCUTANEOUS | Status: DC
  Administered 2015-09-12: 40 mg via SUBCUTANEOUS
  Filled 2015-09-12: qty 0.4

## 2015-09-12 MED ORDER — SENNOSIDES-DOCUSATE SODIUM 8.6-50 MG PO TABS: 1.0000 | ORAL_TABLET | Freq: Every evening | ORAL | Status: DC | PRN

## 2015-09-12 MED ORDER — ONDANSETRON HCL 4 MG/2ML IJ SOLN
4.0000 mg | Freq: Once | INTRAMUSCULAR | Status: AC
  Administered 2015-09-12: 4 mg via INTRAVENOUS
  Filled 2015-09-12: qty 2

## 2015-09-12 MED ORDER — ONDANSETRON HCL 4 MG/2ML IJ SOLN: 4.0000 mg | Freq: Four times a day (QID) | INTRAMUSCULAR | Status: DC | PRN

## 2015-09-12 MED ORDER — LEVOFLOXACIN 500 MG PO TABS
750.0000 mg | ORAL_TABLET | Freq: Every day | ORAL | Status: DC
  Administered 2015-09-12 – 2015-09-13 (×2): 750 mg via ORAL
  Filled 2015-09-12 (×2): qty 1

## 2015-09-12 MED ORDER — ONDANSETRON HCL 4 MG PO TABS: 4.0000 mg | ORAL_TABLET | Freq: Four times a day (QID) | ORAL | Status: DC | PRN

## 2015-09-12 MED ORDER — IOHEXOL 240 MG/ML SOLN
25.0000 mL | Freq: Once | INTRAMUSCULAR | Status: AC | PRN
  Administered 2015-09-12: 25 mL via ORAL

## 2015-09-12 MED ORDER — ENOXAPARIN SODIUM 40 MG/0.4ML ~~LOC~~ SOLN
40.0000 mg | Freq: Two times a day (BID) | SUBCUTANEOUS | Status: DC
  Administered 2015-09-12: 40 mg via SUBCUTANEOUS
  Filled 2015-09-12 (×2): qty 0.4

## 2015-09-12 NOTE — Consult Note (Signed)
Pharmacy Antibiotic Note  Jaylinn Ciccarelli is a 70 y.o. female admitted on 09/12/2015 with pneumonia vs. atelectasis.  Pharmacy has been consulted for Levofloxacin dosing.  Plan: Patient will be initiated on levofloxacin 750mg  PO q24hrs with a recommended duration of 5 days.     Temp (24hrs), Avg:97.7 F (36.5 C), Min:97.7 F (36.5 C), Max:97.7 F (36.5 C)   Recent Labs Lab 09/12/15 0055  WBC 7.9  CREATININE 0.98    CrCl cannot be calculated (Unknown ideal weight.).   Calculated Adjusted BW CrCl of 73.43ml/min, based on weight from 12/2014 of 287lbs  No Known Allergies  Antimicrobials this admission: Anti-infectives    None      Microbiology results: Results for orders placed or performed during the hospital encounter of 04/25/15  Urine culture     Status: None   Collection Time: 04/25/15  3:07 AM  Result Value Ref Range Status   Specimen Description URINE, RANDOM  Final   Special Requests NONE  Final   Culture MULTIPLE SPECIES PRESENT, SUGGEST RECOLLECTION  Final   Report Status 04/27/2015 FINAL  Final    Thank you for allowing pharmacy to be a part of this patient's care.  Vena Rua 09/12/2015 8:51 AM

## 2015-09-12 NOTE — ED Provider Notes (Addendum)
[facility] Emergency Department Provider Note  ____________________________________________  Time seen: Approximately 3:18 AM  I have reviewed the triage vital signs and the nursing notes.   HISTORY  Chief Complaint Abdominal Pain; Chest Pain; and Arm Pain    HPI Taylor Johnson is a 70 y.o. female who presents with abdominal pain and 2 episodes of emesis.  She reports that the abdominal pain has been gradual in onset for about the last 24 hours but persistent.  It is generalized and not specific to any particular region.  She has had 2 episodes of emesis.  She describes the pain as sharp and aching, moderate in intensity, and nothing makes it better or worse.  She initially said that she felt some shortness of breath but now denies that.  She also originally said that she had some chest tightness but denies that as well.  She does have a developmental delay which somewhat limits the ability to get a solid review of systems.She denies fever/chills and dysuria as well as shortness of breath and cough.   Past Medical History  Diagnosis Date  . Diverticulitis   . Anxiety   . Developmental delay     Patient Active Problem List   Diagnosis Date Noted  . Urge incontinence of urine 05/12/2015  . Frequent loose stools 05/12/2015  . Diverticulitis of colon with bleeding 05/11/2015  . Anxiety, generalized 05/11/2015    Past Surgical History  Procedure Laterality Date  . Abdominal hysterectomy      bleeding    Current Outpatient Rx  Name  Route  Sig  Dispense  Refill  . diphenhydrAMINE (BENADRYL) 25 mg capsule   Oral   Take 2 capsules (50 mg total) by mouth every 6 (six) hours as needed.   60 capsule   0   . escitalopram (LEXAPRO) 10 MG tablet      TAKE 1 TABLET BY MOUTH EACH DAY   30 tablet   5   . solifenacin (VESICARE) 5 MG tablet   Oral   Take 1 tablet (5 mg total) by mouth daily.   30 tablet   5     Allergies Review of patient's allergies  indicates no known allergies.  Family History  Problem Relation Age of Onset  . Hypertension Mother     Social History Social History  Substance Use Topics  . Smoking status: Never Smoker   . Smokeless tobacco: Never Used  . Alcohol Use: No    Review of Systems Constitutional: No fever/chills Eyes: No visual changes. ENT: No sore throat. Cardiovascular: Denies chest pain. Respiratory: Denies shortness of breath. Gastrointestinal: Generalized abdominal pain, sharp and aching, with 2 episodes of emesis Genitourinary: Negative for dysuria. Musculoskeletal: Negative for back pain. Skin: Negative for rash. Neurological: Negative for headaches, focal weakness or numbness.  10-point ROS otherwise negative.  ____________________________________________   PHYSICAL EXAM:  VITAL SIGNS: ED Triage Vitals  Enc Vitals Group     BP 09/12/15 0056 165/66 mmHg     Pulse Rate 09/12/15 0056 63     Resp 09/12/15 0230 14     Temp 09/12/15 0056 97.7 F (36.5 C)     Temp Source 09/12/15 0056 Oral     SpO2 09/12/15 0056 95 %     Weight --      Height --      Head Cir --      Peak Flow --      Pain Score 09/12/15 0049 6  Pain Loc --      Pain Edu? --      Excl. in [place]? --     Constitutional: Alert and oriented. Well appearing and in no acute distress. Eyes: Conjunctivae are normal. PERRL. EOMI. Head: Atraumatic. Nose: No congestion/rhinnorhea. Mouth/Throat: Mucous membranes are moist.  Oropharynx non-erythematous. Neck: No stridor.   Cardiovascular: Normal rate, regular rhythm. Grossly normal heart sounds.  Good peripheral circulation. Respiratory: Normal respiratory effort.  No retractions. Lungs CTAB. Gastrointestinal:Obese,  Soft with mild distention and generalized abdominal tenderness to palpation throughout Musculoskeletal: No lower extremity tenderness nor edema.  No joint effusions. Neurologic:  Normal speech and language. No gross focal neurologic deficits are  appreciated.  Skin:  Skin is warm, dry and intact. No rash noted.   ____________________________________________   LABS (all labs ordered are listed, but only abnormal results are displayed)  Labs Reviewed  BASIC METABOLIC PANEL - Abnormal; Notable for the following:    Glucose, Bld 160 (*)    GFR calc non Af Amer 58 (*)    All other components within normal limits  CBC - Abnormal; Notable for the following:    Hemoglobin 11.9 (*)    RDW 16.2 (*)    All other components within normal limits  HEPATIC FUNCTION PANEL - Abnormal; Notable for the following:    ALT 11 (*)    Total Bilirubin 0.1 (*)    Bilirubin, Direct <0.1 (*)    All other components within normal limits  URINALYSIS COMPLETEWITH MICROSCOPIC ([facility] ONLY) - Abnormal; Notable for the following:    Color, Urine STRAW (*)    APPearance CLEAR (*)    Hgb urine dipstick 2+ (*)    Bacteria, UA RARE (*)    Squamous Epithelial / LPF 0-5 (*)    All other components within normal limits  TROPONIN I  LIPASE, BLOOD  TROPONIN I   ____________________________________________  EKG  ED ECG REPORT I, Kyliah Deanda, the attending physician, personally viewed and interpreted this ECG.  Date: 09/12/2015 EKG Time: 00:50 Rate: 60 Rhythm: normal sinus rhythm QRS Axis: normal Intervals: normal ST/T Wave abnormalities: Inverted T waves in leads V4, V5 and V6 which were present on prior EKGs Conduction Disturbances: none Narrative Interpretation: unremarkable  ____________________________________________  RADIOLOGY   Dg Chest 2 View  09/12/2015  CLINICAL DATA:  Shortness of breath and chest pain, onset today. Abdominal pain. EXAM: CHEST  2 VIEW COMPARISON:  04/25/2015 FINDINGS: The cardiomediastinal contours are unchanged with stable mild cardiomegaly. Mild bibasilar atelectasis. Pulmonary vasculature is normal. No consolidation, pleural effusion, or pneumothorax. No acute osseous abnormalities are seen. IMPRESSION: Stable  mild cardiomegaly.  Bibasilar atelectasis. Electronically Signed   By: Jeb Levering M.D.   On: 09/12/2015 01:37   Ct Abdomen Pelvis W Contrast  09/12/2015  CLINICAL DATA:  Acute onset of generalized abdominal pain and nausea. Shortness of breath. Initial encounter. EXAM: CT ABDOMEN AND PELVIS WITH CONTRAST TECHNIQUE: Multidetector CT imaging of the abdomen and pelvis was performed using the standard protocol following bolus administration of intravenous contrast. CONTRAST:  169mL OMNIPAQUE IOHEXOL 300 MG/ML  SOLN COMPARISON:  CT of the abdomen and pelvis performed 04/25/2015 FINDINGS: Mild bibasilar atelectasis is noted. Trace pericardial fluid remains within normal limits. The liver and spleen are unremarkable in appearance. The gallbladder is within normal limits. The pancreas and adrenal glands are unremarkable. The kidneys are unremarkable in appearance. There is no evidence of hydronephrosis. No renal or ureteral stones are seen. No perinephric stranding is appreciated.  No free fluid is identified. The small bowel is unremarkable in appearance. The stomach is within normal limits. No acute vascular abnormalities are seen. The appendix is normal in caliber, without evidence of appendicitis. Diffuse diverticulosis is noted along the entirety of the colon, without evidence of diverticulitis. The bladder is moderately distended and grossly unremarkable. The uterus is mildly enlarged, with multiple fibroids, some of which demonstrate calcification. The ovaries are grossly symmetric and unremarkable in appearance. No inguinal lymphadenopathy is seen. No acute osseous abnormalities are identified. IMPRESSION: 1. No acute abnormality seen within the abdomen or pelvis. 2. Diffuse diverticulosis along the entirety of the colon, without evidence of diverticulitis. 3. Mildly enlarged uterus, with multiple fibroids, some of which demonstrate calcification. 4. Mild bibasilar atelectasis noted. Electronically Signed    By: Garald Balding M.D.   On: 09/12/2015 05:38    ____________________________________________   PROCEDURES  Procedure(s) performed: None  Critical Care performed: No ____________________________________________   INITIAL IMPRESSION / ASSESSMENT AND PLAN / ED COURSE  Pertinent labs & imaging results that were available during my care of the patient were reviewed by me and considered in my medical decision making (see chart for details).  The patient has a variety of complaints, none of them are particular specific.  Given her developmental delay and difficulty communicating what is bothering her, I evaluated her broadly.  Her labs are reassuring, there is no evidence of UTI, her chest x-ray is unremarkable, and the CT scan of her abdomen and pelvis are also unremarkable.  She has been resting comfortably.  I gave her a dose of morphine initially because of her complaint of abdominal pain which did affect are pretty strongly; she became somnolent and desatted briefly but she is now resting comfortably and satting well without the nasal cannula.  She and her family member are comfortable with the plan for discharge and outpatient follow up.  There is no indication of emergent medical condition at this time.  ----------------------------------------- 7:12 AM on 09/12/2015 -----------------------------------------  Patient has been intermittently hypoxemic with an oxygen saturation down to 79% with a good waveform on the monitor.  However at no point has she complained of any shortness of breath.  Since she has been lying in bed all night, we will ambulate her with a pulse oximeter to determine her ambulatory oxygenation to help determine if she can go home or if she needs to stay in the hospital for hypoxemia.  It is possible, though I think unlikely based on her clinical symptoms, that the bibasilar atelectasis actually represents pneumonia, but I think that atelectasis is much more  likely.  7:30 AM:  With ambulation the patient had an spO2 between 83-86%.  Given her persistent hypoxemia, I will admit her for further evaluation.  I have put her back on 2L O2 [place] and her saturation is in the upper 90s.  I ordered a second troponin which is pending.  ____________________________________________  FINAL CLINICAL IMPRESSION(S) / ED DIAGNOSES  Final diagnoses:  Abdominal pain, unspecified abdominal location  Hypoxemia  Mild bibasilar atelectasis      NEW MEDICATIONS STARTED DURING THIS VISIT:  New Prescriptions   No medications on file      Please note that this document was prepared using voice recognition software and may include unintentional dictation errors.   Hinda Kehr, MD 09/12/15 [id number]  Hinda Kehr, MD 09/12/15 0730

## 2015-09-12 NOTE — Progress Notes (Addendum)
70 y/o F on Lovenox 40 mg daily for DVT prophylaxis.   Wt: 125.6 kg, BMI: 47, CrCl: 72 ml/min  Will change Lovenox to 40 mg q 12 hours for weight > 100 kg and BMI > 40.   Ulice Dash, PharmD Clinical Pharmacist

## 2015-09-12 NOTE — ED Notes (Signed)
Pt's O2 decreasing down to low 80s. MD Karma Greaser notified. MD ordered to not put back on O2.  MD ordered to give pt food/drink. Food drink brought to pt.

## 2015-09-12 NOTE — ED Notes (Signed)
Pt's HR dropped down to 42 bpm. Verified with palpation. Pt not symptomatic. MD Karma Greaser notified.

## 2015-09-12 NOTE — Discharge Instructions (Signed)
You have been seen in the Emergency Department (ED) for abdominal pain.  Your evaluation did not identify a clear cause of your symptoms but was generally reassuring. ° °Please follow up as instructed above regarding today’s emergent visit and the symptoms that are bothering you. ° °Return to the ED if your abdominal pain worsens or fails to improve, you develop bloody vomiting, bloody diarrhea, you are unable to tolerate fluids due to vomiting, fever greater than 101, or other symptoms that concern you. ° ° °Abdominal Pain, Adult °Many things can cause abdominal pain. Usually, abdominal pain is not caused by a disease and will improve without treatment. It can often be observed and treated at home. Your health care provider will do a physical exam and possibly order blood tests and X-rays to help determine the seriousness of your pain. However, in many cases, more time must pass before a clear cause of the pain can be found. Before that point, your health care provider may not know if you need more testing or further treatment. °HOME CARE INSTRUCTIONS °Monitor your abdominal pain for any changes. The following actions may help to alleviate any discomfort you are experiencing: °· Only take over-the-counter or prescription medicines as directed by your health care provider. °· Do not take laxatives unless directed to do so by your health care provider. °· Try a clear liquid diet (broth, tea, or water) as directed by your health care provider. Slowly move to a bland diet as tolerated. °SEEK MEDICAL CARE IF: °· You have unexplained abdominal pain. °· You have abdominal pain associated with nausea or diarrhea. °· You have pain when you urinate or have a bowel movement. °· You experience abdominal pain that wakes you in the night. °· You have abdominal pain that is worsened or improved by eating food. °· You have abdominal pain that is worsened with eating fatty foods. °· You have a fever. °SEEK IMMEDIATE MEDICAL CARE  IF: °· Your pain does not go away within 2 hours. °· You keep throwing up (vomiting). °· Your pain is felt only in portions of the abdomen, such as the right side or the left lower portion of the abdomen. °· You pass bloody or black tarry stools. °MAKE SURE YOU: °· Understand these instructions. °· Will watch your condition. °· Will get help right away if you are not doing well or get worse. °  °This information is not intended to replace advice given to you by your health care provider. Make sure you discuss any questions you have with your health care provider. °  °Document Released: 04/13/2005 Document Revised: 03/25/2015 Document Reviewed: 03/13/2013 °Elsevier Interactive Patient Education ©2016 [employer]vier Inc. ° °

## 2015-09-12 NOTE — ED Notes (Signed)
MD Karma Greaser removed pt from O2.

## 2015-09-12 NOTE — H&P (Signed)
[facility] [place] NAME: Hadara Holsman    MR#:  [id number]  DATE OF BIRTH:  Feb 05, 1946  DATE OF ADMISSION:  09/12/2015  PRIMARY CARE PHYSICIAN: Halina Maidens, MD   REQUESTING/REFERRING PHYSICIAN: Dr Karma Greaser  CHIEF COMPLAINT:   Hypoxia HISTORY OF PRESENT ILLNESS:  Taylor Johnson  is a 70 y.o. female with a known history of diverticulitis who initially presented with abdominal pain and emesis. Her CT scan the abdomen did not show any acute pathology, however on ambulation patient is found to have hypoxia. Chest x-ray did not show evidence of pneumonia.  PAST MEDICAL HISTORY:   Past Medical History  Diagnosis Date  . Diverticulitis   . Anxiety   . Developmental delay     PAST SURGICAL HISTORY:   Past Surgical History  Procedure Laterality Date  . Abdominal hysterectomy      bleeding    SOCIAL HISTORY:   Social History  Substance Use Topics  . Smoking status: Never Smoker   . Smokeless tobacco: Never Used  . Alcohol Use: No    FAMILY HISTORY:   Family History  Problem Relation Age of Onset  . Hypertension Mother     DRUG ALLERGIES:  No Known Allergies   REVIEW OF SYSTEMS:  CONSTITUTIONAL: No fever, fatigue or weakness.  EYES: No blurred or double vision.  EARS, NOSE, AND THROAT: No tinnitus or ear pain.  RESPIRATORY: No cough, shortness of breath, wheezing or hemoptysis.  CARDIOVASCULAR: No chest pain, orthopnea, edema.  GASTROINTESTINAL: ++nausea, vomiting, NO diarrhea ++ generalized abdominal pain.  GENITOURINARY: No dysuria, hematuria.  ENDOCRINE: No polyuria, nocturia,  HEMATOLOGY: No anemia, easy bruising or bleeding SKIN: No rash or lesion. MUSCULOSKELETAL: No joint pain or arthritis.   NEUROLOGIC: No tingling, numbness, weakness.  PSYCHIATRY: No anxiety or depression.   MEDICATIONS AT HOME:   Prior to Admission medications   Medication Sig Start Date End Date Taking? Authorizing Provider   diphenhydrAMINE (BENADRYL) 25 mg capsule Take 2 capsules (50 mg total) by mouth every 6 (six) hours as needed. 12/22/14  Yes Carrie Mew, MD  escitalopram (LEXAPRO) 10 MG tablet TAKE 1 TABLET BY MOUTH EACH DAY 05/12/15  Yes [name], MD  solifenacin (VESICARE) 5 MG tablet Take 1 tablet (5 mg total) by mouth daily. 05/18/15  Yes [name], MD      VITAL SIGNS:  Blood pressure 148/50, pulse 58, temperature 97.7 F (36.5 C), temperature source Oral, resp. rate 12, height 5\' 4"  (1.626 m), weight 125.646 kg (277 lb), SpO2 100 %.  PHYSICAL EXAMINATION:  GENERAL:  70 y.o.-year-old patient lying in the bed with no acute distress.  EYES: Pupils equal, round, reactive to light and accommodation. No scleral icterus. Extraocular muscles intact.  HEENT: Head atraumatic, normocephalic. Oropharynx and nasopharynx clear.  NECK:  Supple, no jugular venous distention. No thyroid enlargement, no tenderness.  LUNGS: Normal breath sounds bilaterally, no wheezing, rales,rhonchi or crepitation. No use of accessory muscles of respiration.  CARDIOVASCULAR: S1, S2 normal. No murmurs, rubs, or gallops.  ABDOMEN: Soft, nontender, nondistended. Bowel sounds present. No organomegaly or mass.  EXTREMITIES: No pedal edema, cyanosis, or clubbing.  NEUROLOGIC: Cranial nerves II through XII are grossly intact. No focal deficits. PSYCHIATRIC: The patient is alert and oriented x 3.  SKIN: No obvious rash, lesion, or ulcer.   LABORATORY PANEL:   CBC  Recent Labs Lab 09/12/15 0055  WBC 7.9  HGB 11.9*  HCT 36.6  PLT  214   ------------------------------------------------------------------------------------------------------------------  Chemistries   Recent Labs Lab 09/12/15 0054 09/12/15 0055  NA  --  139  K  --  3.8  CL  --  101  CO2  --  31  GLUCOSE  --  160*  BUN  --  20  CREATININE  --  0.98  CALCIUM  --  9.1  AST 15  --   ALT 11*  --   ALKPHOS 78  --   BILITOT 0.1*  --     ------------------------------------------------------------------------------------------------------------------  Cardiac Enzymes  Recent Labs Lab 09/12/15 0055 09/12/15 0746  TROPONINI <0.03 <0.03   ------------------------------------------------------------------------------------------------------------------  RADIOLOGY:  Dg Chest 2 View  09/12/2015  CLINICAL DATA:  Shortness of breath and chest pain, onset today. Abdominal pain. EXAM: CHEST  2 VIEW COMPARISON:  04/25/2015 FINDINGS: The cardiomediastinal contours are unchanged with stable mild cardiomegaly. Mild bibasilar atelectasis. Pulmonary vasculature is normal. No consolidation, pleural effusion, or pneumothorax. No acute osseous abnormalities are seen. IMPRESSION: Stable mild cardiomegaly.  Bibasilar atelectasis. Electronically Signed   By: Jeb Levering M.D.   On: 09/12/2015 01:37   Ct Abdomen Pelvis W Contrast  09/12/2015  CLINICAL DATA:  Acute onset of generalized abdominal pain and nausea. Shortness of breath. Initial encounter. EXAM: CT ABDOMEN AND PELVIS WITH CONTRAST TECHNIQUE: Multidetector CT imaging of the abdomen and pelvis was performed using the standard protocol following bolus administration of intravenous contrast. CONTRAST:  153mL OMNIPAQUE IOHEXOL 300 MG/ML  SOLN COMPARISON:  CT of the abdomen and pelvis performed 04/25/2015 FINDINGS: Mild bibasilar atelectasis is noted. Trace pericardial fluid remains within normal limits. The liver and spleen are unremarkable in appearance. The gallbladder is within normal limits. The pancreas and adrenal glands are unremarkable. The kidneys are unremarkable in appearance. There is no evidence of hydronephrosis. No renal or ureteral stones are seen. No perinephric stranding is appreciated. No free fluid is identified. The small bowel is unremarkable in appearance. The stomach is within normal limits. No acute vascular abnormalities are seen. The appendix is normal in caliber,  without evidence of appendicitis. Diffuse diverticulosis is noted along the entirety of the colon, without evidence of diverticulitis. The bladder is moderately distended and grossly unremarkable. The uterus is mildly enlarged, with multiple fibroids, some of which demonstrate calcification. The ovaries are grossly symmetric and unremarkable in appearance. No inguinal lymphadenopathy is seen. No acute osseous abnormalities are identified. IMPRESSION: 1. No acute abnormality seen within the abdomen or pelvis. 2. Diffuse diverticulosis along the entirety of the colon, without evidence of diverticulitis. 3. Mildly enlarged uterus, with multiple fibroids, some of which demonstrate calcification. 4. Mild bibasilar atelectasis noted. Electronically Signed   By: Garald Balding M.D.   On: 09/12/2015 05:38    EKG:  Normal sinus rhythm and inverted T waves in lateral leads on prior EKG these were found as well.   IMPRESSION AND PLAN:   70 year old female who presented with abdominal pain and nausea and then found to have hypoxia.  1. Acute hypoxic respiratory failure: Chest x-ray is consistent by basal atelectasis. Patient may have superimposed pneumonia. I will treat her with Levaquin. She remains hypoxic I will order CT chest to evaluate for pulmonary emboli. Wean oxygen as tolerated. Incentive spirometry.  2. Abdominal pain with nausea: Patient is no longer have any symptoms. CT scan of the abdomen showed no acute abnormality.      All the records are reviewed and case discussed with ED provider. Management plans discussed with the patient and she  is in agreement.  CODE STATUS: full  TOTAL TIME TAKING CARE OF THIS PATIENT: 45 minutes.    Merci Walthers M.D on 09/12/2015 at 10:32 AM  Between 7am to 6pm - Pager - [phone] After 6pm go to [email or web address] - password [email or web address] [facility]  Office  [phone]  CC: Primary care physician; Halina Maidens, MD

## 2015-09-12 NOTE — ED Notes (Signed)
Pt c/o of generalized abdominal pain, with tenderness in the right upper and lower quadrants. Pt denies chest pain. Pt c/o pain in the right arm, that has now resolved. Pt reports 2 emesis in the last 24 hours.

## 2015-09-12 NOTE — ED Notes (Signed)
Pt ambulated. O2 sats dropping to 87% r/a. Pt denies any sob. MD notified.

## 2015-09-12 NOTE — ED Notes (Signed)
Patient transported to CT 

## 2015-09-12 NOTE — ED Notes (Signed)
Pt ambulatory to triage with no difficulty. Pt reports she developed abd pain earlier today with nausea. Pt also reports pain in her chest with feeling of shortness of breath. Pt speech is slow but family member reports it is her normal. Pt also states she is hurting in her right arm in the region of her elbow but denies injury.

## 2015-09-12 NOTE — ED Notes (Signed)
Pt's O2 sats decreased to 70s. Pt was placed on 2L [place]> Pt O2 sats currently 100%. MD Karma Greaser informed.

## 2015-09-13 DIAGNOSIS — J9811 Atelectasis: Secondary | ICD-10-CM | POA: Diagnosis not present

## 2015-09-13 LAB — BASIC METABOLIC PANEL
Anion gap: 5 (ref 5–15)
BUN: 22 mg/dL — ABNORMAL HIGH (ref 6–20)
CO2: 31 mmol/L (ref 22–32)
Calcium: 8.8 mg/dL — ABNORMAL LOW (ref 8.9–10.3)
Chloride: 102 mmol/L (ref 101–111)
Creatinine, Ser: 0.97 mg/dL (ref 0.44–1.00)
GFR calc Af Amer: >60 mL/min (ref >60)
GFR calc non Af Amer: 59 mL/min — ABNORMAL LOW (ref >60)
Glucose, Bld: 95 mg/dL (ref 65–99)
Potassium: 4.5 mmol/L (ref 3.5–5.1)
Sodium: 138 mmol/L (ref 135–145)

## 2015-09-13 LAB — CBC
HCT: 36.3 % (ref 35.0–47.0)
Hemoglobin: 11.7 g/dL — ABNORMAL LOW (ref 12.0–16.0)
MCH: 28 pg (ref 26.0–34.0)
MCHC: 32.3 g/dL (ref 32.0–36.0)
MCV: 86.8 fL (ref 80.0–100.0)
Platelets: 198 10*3/uL (ref 150–440)
RBC: 4.18 MIL/uL (ref 3.80–5.20)
RDW: 16.7 % — ABNORMAL HIGH (ref 11.5–14.5)
WBC: 5.2 10*3/uL (ref 3.6–11.0)

## 2015-09-13 MED ORDER — LEVOFLOXACIN 750 MG PO TABS: 750.0000 mg | ORAL_TABLET | Freq: Every day | ORAL | Status: DC

## 2015-09-13 NOTE — Discharge Summary (Signed)
[facility] [place] NAME: Taylor Johnson    MR#:  [id number]  DATE OF BIRTH:  Dec 08, 1945  DATE OF ADMISSION:  09/12/2015 ADMITTING PHYSICIAN: Bettey Costa, MD  DATE OF DISCHARGE: 09/13/2015  PRIMARY CARE PHYSICIAN: Halina Maidens, MD    ADMISSION DIAGNOSIS:  Hypoxemia [R09.02] Mild bibasilar atelectasis [J98.11] Abdominal pain, unspecified abdominal location [R10.9]  DISCHARGE DIAGNOSIS:  Active Problems:   Hypoxia   SECONDARY DIAGNOSIS:   Past Medical History  Diagnosis Date  . Diverticulitis   . Anxiety   . Developmental delay     HOSPITAL COURSE:    70 year old female who presented with abdominal pain and nausea and then found to have hypoxia.  1. Acute hypoxic respiratory failure: Chest x-ray is consistent with atelectasis. Patient may have also had  superimposed pneumonia. I treated her with Levaquin and ISS. She is no longer hypoxic. She'll continue Levaquin for 3 more days.  2. Abdominal pain with nausea: Patient is no longer have any symptoms. CT scan of the abdomen showed no acute abnormality   DISCHARGE CONDITIONS AND DIET:   Patient stable for discharge on a regular diet  CONSULTS OBTAINED:     DRUG ALLERGIES:  No Known Allergies  DISCHARGE MEDICATIONS:   Current Discharge Medication List    START taking these medications   Details  levofloxacin (LEVAQUIN) 750 MG tablet Take 1 tablet (750 mg total) by mouth daily. Qty: 3 tablet, Refills: 0      CONTINUE these medications which have NOT CHANGED   Details  diphenhydrAMINE (BENADRYL) 25 mg capsule Take 2 capsules (50 mg total) by mouth every 6 (six) hours as needed. Qty: 60 capsule, Refills: 0    escitalopram (LEXAPRO) 10 MG tablet TAKE 1 TABLET BY MOUTH EACH DAY Qty: 30 tablet, Refills: 5   Associated Diagnoses: Anxiety, generalized    solifenacin (VESICARE) 5 MG tablet Take 1 tablet (5 mg total) by mouth daily. Qty: 30 tablet, Refills: 5               Today   CHIEF COMPLAINT:  No acute issues overnight. Patient reports notion chest pressure this breath. No bone pain or nausea. She tolerated her diet well.   VITAL SIGNS:  Blood pressure 123/51, pulse 49, temperature 97.9 F (36.6 C), temperature source Oral, resp. rate 20, height 5\' 4"  (1.626 m), weight 125.646 kg (277 lb), SpO2 100 %.   REVIEW OF SYSTEMS:  Review of Systems  Constitutional: Negative for fever, chills and malaise/fatigue.  HENT: Negative for sore throat.   Eyes: Negative for blurred vision.  Respiratory: Negative for cough, hemoptysis, shortness of breath and wheezing.   Cardiovascular: Negative for chest pain, palpitations and leg swelling.  Gastrointestinal: Negative for nausea, vomiting, abdominal pain, diarrhea and blood in stool.  Genitourinary: Negative for dysuria.  Musculoskeletal: Negative for back pain.  Neurological: Negative for dizziness, tremors and headaches.  Endo/Heme/Allergies: Does not bruise/bleed easily.     PHYSICAL EXAMINATION:  GENERAL:  70 y.o.-year-old patient lying in the bed with no acute distress.  NECK:  Supple, no jugular venous distention. No thyroid enlargement, no tenderness.  LUNGS: Normal breath sounds bilaterally, no wheezing, rales,rhonchi  No use of accessory muscles of respiration.  CARDIOVASCULAR: S1, S2 normal. No murmurs, rubs, or gallops.  ABDOMEN: Soft, non-tender, non-distended. Bowel sounds present. No organomegaly or mass.  EXTREMITIES: No pedal edema, cyanosis, or clubbing.  PSYCHIATRIC: The patient is alert and oriented x 3.  SKIN: No  obvious rash, lesion, or ulcer.   DATA REVIEW:   CBC  Recent Labs Lab 09/13/15 0706  WBC 5.2  HGB 11.7*  HCT 36.3  PLT 198    Chemistries   Recent Labs Lab 09/12/15 0054  09/13/15 0706  NA  --   < > 138  K  --   < > 4.5  CL  --   < > 102  CO2  --   < > 31  GLUCOSE  --   < > 95  BUN  --   < > 22*  CREATININE  --   < > 0.97  CALCIUM  --    < > 8.8*  AST 15  --   --   ALT 11*  --   --   ALKPHOS 78  --   --   BILITOT 0.1*  --   --   < > = values in this interval not displayed.  Cardiac Enzymes  Recent Labs Lab 09/12/15 0055 09/12/15 0746  TROPONINI <0.03 <0.03    Microbiology Results  @[email or web address] @  RADIOLOGY:  Dg Chest 2 View  09/12/2015  CLINICAL DATA:  Shortness of breath and chest pain, onset today. Abdominal pain. EXAM: CHEST  2 VIEW COMPARISON:  04/25/2015 FINDINGS: The cardiomediastinal contours are unchanged with stable mild cardiomegaly. Mild bibasilar atelectasis. Pulmonary vasculature is normal. No consolidation, pleural effusion, or pneumothorax. No acute osseous abnormalities are seen. IMPRESSION: Stable mild cardiomegaly.  Bibasilar atelectasis. Electronically Signed   By: Jeb Levering M.D.   On: 09/12/2015 01:37   Ct Abdomen Pelvis W Contrast  09/12/2015  CLINICAL DATA:  Acute onset of generalized abdominal pain and nausea. Shortness of breath. Initial encounter. EXAM: CT ABDOMEN AND PELVIS WITH CONTRAST TECHNIQUE: Multidetector CT imaging of the abdomen and pelvis was performed using the standard protocol following bolus administration of intravenous contrast. CONTRAST:  160mL OMNIPAQUE IOHEXOL 300 MG/ML  SOLN COMPARISON:  CT of the abdomen and pelvis performed 04/25/2015 FINDINGS: Mild bibasilar atelectasis is noted. Trace pericardial fluid remains within normal limits. The liver and spleen are unremarkable in appearance. The gallbladder is within normal limits. The pancreas and adrenal glands are unremarkable. The kidneys are unremarkable in appearance. There is no evidence of hydronephrosis. No renal or ureteral stones are seen. No perinephric stranding is appreciated. No free fluid is identified. The small bowel is unremarkable in appearance. The stomach is within normal limits. No acute vascular abnormalities are seen. The appendix is normal in caliber, without evidence of appendicitis. Diffuse  diverticulosis is noted along the entirety of the colon, without evidence of diverticulitis. The bladder is moderately distended and grossly unremarkable. The uterus is mildly enlarged, with multiple fibroids, some of which demonstrate calcification. The ovaries are grossly symmetric and unremarkable in appearance. No inguinal lymphadenopathy is seen. No acute osseous abnormalities are identified. IMPRESSION: 1. No acute abnormality seen within the abdomen or pelvis. 2. Diffuse diverticulosis along the entirety of the colon, without evidence of diverticulitis. 3. Mildly enlarged uterus, with multiple fibroids, some of which demonstrate calcification. 4. Mild bibasilar atelectasis noted. Electronically Signed   By: Garald Balding M.D.   On: 09/12/2015 05:38      Management plans discussed with the patient and she is in agreement. Stable for discharge home  Patient should follow up with PCP in 1 week  CODE STATUS:     Code Status Orders        Start     Ordered   09/12/15 [phone]  Full code   Continuous     09/12/15 0838    Code Status History    Date Active Date Inactive Code Status Order ID Comments User Context   This patient has a current code status but no historical code status.      TOTAL TIME TAKING CARE OF THIS PATIENT: 35 minutes.    Note: This dictation was prepared with Dragon dictation along with smaller phrase technology. Any transcriptional errors that result from this process are unintentional.  Lelar Farewell M.D on 09/13/2015 at 9:55 AM  Between 7am to 6pm - Pager - [phone] After 6pm go to [email or web address] - password [email or web address] [facility]  Office  [phone]  CC: Primary care physician; Halina Maidens, MD

## 2015-09-13 NOTE — Progress Notes (Signed)
Pt d/c home; d/c instructions reviewed w/ pt; pt understanding was verbalized; IV removed catheter in tact, gauze dressing applied; all pt questions answered; pt left unit via wheelchair accompanied by staff 

## 2015-09-18 ENCOUNTER — Ambulatory Visit (INDEPENDENT_AMBULATORY_CARE_PROVIDER_SITE_OTHER): Payer: Medicare Other | Admitting: Internal Medicine

## 2015-09-18 ENCOUNTER — Encounter: Payer: Self-pay | Admitting: Internal Medicine

## 2015-09-18 VITALS — BP 132/84 | HR 55 | Ht 64.0 in | Wt 273.0 lb

## 2015-09-18 DIAGNOSIS — R1310 Dysphagia, unspecified: Secondary | ICD-10-CM | POA: Diagnosis not present

## 2015-09-18 DIAGNOSIS — R0902 Hypoxemia: Secondary | ICD-10-CM

## 2015-09-18 DIAGNOSIS — R0683 Snoring: Secondary | ICD-10-CM | POA: Diagnosis not present

## 2015-09-18 DIAGNOSIS — R1013 Epigastric pain: Secondary | ICD-10-CM

## 2015-09-18 NOTE — Progress Notes (Signed)
Date:  09/18/2015   Name:  Taylor Johnson   DOB:  22-Jan-1946   MRN:  [id number]   Chief Complaint: Hospitalization Follow-up; Abdominal Pain; and Hypoxia  Patient was admitted to [facility] on 09/12/2015 with hypoxemia and abdominal pain. Discharged on 09/13/2015. CT abdomen was normal. Chest x-ray did not show infiltrate but with hypoxemia he was felt she had a mild underlying pneumonia. She was treated with IV Levaquin and sent home with additional oral therapy after her hypoxemia resolved. Her sister is here with here and reports that she has witnessed snoring and apnea on several occasions.  She would like her to have a sleep study. Jolicia reports that she still has some abdominal pain.  She can not provided a very good history.  We did note that she has uterine fibroids which may be contributing.  Her CT done at [facility] was completely normal. Choking - sister has noticed multiple episodes of coughing while eating.  She also has some cough without food.  She denies vomiting.  She seems to get choked and then has to cough strongly for several minutes.  She has not passed out.  Review of Systems  Constitutional: Negative for fever, chills and fatigue.  Eyes: Negative for visual disturbance.  Respiratory: Positive for cough and choking. Negative for chest tightness, shortness of breath and wheezing.   Cardiovascular: Negative for chest pain, palpitations and leg swelling.  Gastrointestinal: Positive for abdominal pain. Negative for nausea, vomiting, diarrhea and blood in stool.  Genitourinary: Negative for vaginal bleeding.  Neurological: Negative for dizziness, tremors, syncope, weakness and headaches.  Psychiatric/Behavioral: Negative for behavioral problems. The patient is not nervous/anxious and is not hyperactive.     Patient Active Problem List   Diagnosis Date Noted  . Hypoxia 09/12/2015  . Urge incontinence of urine 05/12/2015  . Frequent loose stools 05/12/2015  . Diverticulitis of colon  with bleeding 05/11/2015  . Anxiety, generalized 05/11/2015    Prior to Admission medications   Medication Sig Start Date End Date Taking? Authorizing Provider  diphenhydrAMINE (BENADRYL) 25 mg capsule Take 2 capsules (50 mg total) by mouth every 6 (six) hours as needed. 12/22/14   Carrie Mew, MD  escitalopram (LEXAPRO) 10 MG tablet TAKE 1 TABLET BY MOUTH EACH DAY 05/12/15   [name], MD  levofloxacin (LEVAQUIN) 750 MG tablet Take 1 tablet (750 mg total) by mouth daily. 09/13/15   Bettey Costa, MD  solifenacin (VESICARE) 5 MG tablet Take 1 tablet (5 mg total) by mouth daily. 05/18/15   [name], MD    No Known Allergies  Past Surgical History  Procedure Laterality Date  . Abdominal hysterectomy      bleeding    Social History  Substance Use Topics  . Smoking status: Never Smoker   . Smokeless tobacco: Never Used  . Alcohol Use: No     Medication list has been reviewed and updated.   Physical Exam  Constitutional: She appears well-developed and well-nourished. No distress.  HENT:  Head: Normocephalic and atraumatic.  Neck: Normal range of motion. Neck supple.  Cardiovascular: Normal rate, regular rhythm and normal heart sounds.   Pulmonary/Chest: Effort normal and breath sounds normal. No respiratory distress.  Abdominal: Soft. Bowel sounds are normal. There is no tenderness. There is no rebound and no guarding.  Musculoskeletal: Normal range of motion. She exhibits no edema or tenderness.  Lymphadenopathy:    She has no cervical adenopathy.  Neurological: She is alert.  Skin: Skin  is warm and dry. No rash noted.  Psychiatric: She has a normal mood and affect. Her speech is normal and behavior is normal.  Nursing note and vitals reviewed.   BP 132/84 mmHg  Pulse 55  Ht 5\' 4"  (1.626 m)  Wt 273 lb (123.832 kg)  BMI 46.84 kg/m2  SpO2 97%  Assessment and Plan: 1. Hypoxia Resolved after a 5 day course of Levaquin - Ambulatory referral to Sleep  Studies  2. Dysphagia Needs evaluation - will start with Ba Swallow - DG Esophagus; Future  3. Snoring - Ambulatory referral to Sleep Studies  4. Epigastric pain May be due to uterine fibroids - could consider tylenol  Not of great concern since CT negative  Halina Maidens, MD [facility]  09/18/2015

## 2015-10-02 ENCOUNTER — Telehealth: Payer: Self-pay | Admitting: Internal Medicine

## 2015-10-13 ENCOUNTER — Ambulatory Visit: Payer: Medicare Other

## 2015-10-13 DIAGNOSIS — R1310 Dysphagia, unspecified: Secondary | ICD-10-CM | POA: Diagnosis present

## 2015-10-14 ENCOUNTER — Ambulatory Visit
Admission: RE | Admit: 2015-10-14 | Discharge: 2015-10-14 | Disposition: A | Discharge status: Home / Self Care | Payer: Medicare Other | Source: Ambulatory Visit | Attending: Internal Medicine | Admitting: Internal Medicine

## 2015-10-14 DIAGNOSIS — R1310 Dysphagia, unspecified: Secondary | ICD-10-CM

## 2015-10-15 ENCOUNTER — Other Ambulatory Visit: Payer: Self-pay | Admitting: Internal Medicine

## 2015-10-15 ENCOUNTER — Telehealth: Payer: Self-pay

## 2015-10-15 DIAGNOSIS — R1310 Dysphagia, unspecified: Secondary | ICD-10-CM

## 2015-10-15 HISTORY — DX: Dysphagia, unspecified: R13.10

## 2015-10-15 NOTE — Telephone Encounter (Signed)
-----   Message from [name], MD sent at 10/14/2015  4:37 PM EDT ----- Swallowing study was normal.  If symptoms are persistent, I would refer to ENT.

## 2015-10-15 NOTE — Telephone Encounter (Signed)
Spoke with patient. Patient advised of all results and verbalized understanding. Will call back with any future questions or concerns. MAH  

## 2015-10-15 NOTE — Telephone Encounter (Signed)
Left message for patient to call back  

## 2015-11-12 ENCOUNTER — Ambulatory Visit: Payer: Medicare Other | Attending: Otolaryngology

## 2015-11-12 DIAGNOSIS — G4733 Obstructive sleep apnea (adult) (pediatric): Secondary | ICD-10-CM | POA: Diagnosis present

## 2015-11-12 DIAGNOSIS — R0683 Snoring: Secondary | ICD-10-CM | POA: Diagnosis not present

## 2015-11-18 ENCOUNTER — Encounter: Payer: Self-pay | Admitting: Internal Medicine

## 2015-11-18 DIAGNOSIS — Z9989 Dependence on other enabling machines and devices: Secondary | ICD-10-CM

## 2015-11-18 DIAGNOSIS — G4733 Obstructive sleep apnea (adult) (pediatric): Secondary | ICD-10-CM

## 2015-11-18 HISTORY — DX: Obstructive sleep apnea (adult) (pediatric): Z99.89

## 2015-11-18 HISTORY — DX: Obstructive sleep apnea (adult) (pediatric): G47.33

## 2015-12-18 ENCOUNTER — Other Ambulatory Visit: Payer: Self-pay | Admitting: Internal Medicine

## 2015-12-18 MED ORDER — SOLIFENACIN SUCCINATE 5 MG PO TABS: 5.0000 mg | ORAL_TABLET | Freq: Every day | ORAL | Status: DC

## 2016-01-08 ENCOUNTER — Emergency Department
Admission: EM | Admit: 2016-01-08 | Discharge: 2016-01-08 | Disposition: A | Discharge status: Home / Self Care | Payer: Medicare Other | Attending: Emergency Medicine | Admitting: Emergency Medicine

## 2016-01-08 ENCOUNTER — Emergency Department: Payer: Medicare Other

## 2016-01-08 DIAGNOSIS — Z79899 Other long term (current) drug therapy: Secondary | ICD-10-CM | POA: Insufficient documentation

## 2016-01-08 DIAGNOSIS — J069 Acute upper respiratory infection, unspecified: Secondary | ICD-10-CM | POA: Insufficient documentation

## 2016-01-08 DIAGNOSIS — R05 Cough: Secondary | ICD-10-CM | POA: Diagnosis present

## 2016-01-08 MED ORDER — SULFAMETHOXAZOLE-TRIMETHOPRIM 800-160 MG PO TABS: 1.0000 | ORAL_TABLET | Freq: Two times a day (BID) | ORAL | Status: DC

## 2016-01-08 MED ORDER — GUAIFENESIN-CODEINE 100-10 MG/5ML PO SOLN: 10.0000 mL | ORAL | Status: DC | PRN

## 2016-01-08 MED ORDER — OXYCODONE-ACETAMINOPHEN 5-325 MG PO TABS: 1.0000 | ORAL_TABLET | ORAL | Status: DC | PRN

## 2016-01-08 NOTE — Discharge Instructions (Signed)
Upper Respiratory Infection, Adult Most upper respiratory infections (URIs) are a viral infection of the air passages leading to the lungs. A URI affects the nose, throat, and upper air passages. The most common type of URI is nasopharyngitis and is typically referred to as "the common cold." URIs run their course and usually go away on their own. Most of the time, a URI does not require medical attention, but sometimes a bacterial infection in the upper airways can follow a viral infection. This is called a secondary infection. Sinus and middle ear infections are common types of secondary upper respiratory infections. Bacterial pneumonia can also complicate a URI. A URI can worsen asthma and chronic obstructive pulmonary disease (COPD). Sometimes, these complications can require emergency medical care and may be life threatening.  CAUSES Almost all URIs are caused by viruses. A virus is a type of germ and can spread from one person to another.  RISKS FACTORS You may be at risk for a URI if:   You smoke.   You have chronic heart or lung disease.  You have a weakened defense (immune) system.   You are very young or very old.   You have nasal allergies or asthma.  You work in crowded or poorly ventilated areas.  You work in health care facilities or schools. SIGNS AND SYMPTOMS  Symptoms typically develop 2-3 days after you come in contact with a cold virus. Most viral URIs last 7-10 days. However, viral URIs from the influenza virus (flu virus) can last 14-18 days and are typically more severe. Symptoms may include:   Runny or stuffy (congested) nose.   Sneezing.   Cough.   Sore throat.   Headache.   Fatigue.   Fever.   Loss of appetite.   Pain in your forehead, behind your eyes, and over your cheekbones (sinus pain).  Muscle aches.  DIAGNOSIS  Your health care provider may diagnose a URI by:  Physical exam.  Tests to check that your symptoms are not due to  another condition such as:  Strep throat.  Sinusitis.  Pneumonia.  Asthma. TREATMENT  A URI goes away on its own with time. It cannot be cured with medicines, but medicines may be prescribed or recommended to relieve symptoms. Medicines may help:  Reduce your fever.  Reduce your cough.  Relieve nasal congestion. HOME CARE INSTRUCTIONS   Take medicines only as directed by your health care provider.   Gargle warm saltwater or take cough drops to comfort your throat as directed by your health care provider.  Use a warm mist humidifier or inhale steam from a shower to increase air moisture. This may make it easier to breathe.  Drink enough fluid to keep your urine clear or pale yellow.   Eat soups and other clear broths and maintain good nutrition.   Rest as needed.   Return to work when your temperature has returned to normal or as your health care provider advises. You may need to stay home longer to avoid infecting others. You can also use a face mask and careful hand washing to prevent spread of the virus.  Increase the usage of your inhaler if you have asthma.   Do not use any tobacco products, including cigarettes, chewing tobacco, or electronic cigarettes. If you need help quitting, ask your health care provider. PREVENTION  The best way to protect yourself from getting a cold is to practice good hygiene.   Avoid oral or hand contact with people with cold   symptoms.   Wash your hands often if contact occurs.  There is no clear evidence that vitamin C, vitamin E, echinacea, or exercise reduces the chance of developing a cold. However, it is always recommended to get plenty of rest, exercise, and practice good nutrition.  SEEK MEDICAL CARE IF:   You are getting worse rather than better.   Your symptoms are not controlled by medicine.   You have chills.  You have worsening shortness of breath.  You have brown or red mucus.  You have yellow or brown nasal  discharge.  You have pain in your face, especially when you bend forward.  You have a fever.  You have swollen neck glands.  You have pain while swallowing.  You have white areas in the back of your throat. SEEK IMMEDIATE MEDICAL CARE IF:   You have severe or persistent:  Headache.  Ear pain.  Sinus pain.  Chest pain.  You have chronic lung disease and any of the following:  Wheezing.  Prolonged cough.  Coughing up blood.  A change in your usual mucus.  You have a stiff neck.  You have changes in your:  Vision.  Hearing.  Thinking.  Mood. MAKE SURE YOU:   Understand these instructions.  Will watch your condition.  Will get help right away if you are not doing well or get worse.   This information is not intended to replace advice given to you by your health care provider. Make sure you discuss any questions you have with your health care provider.   Document Released: 12/28/2000 Document Revised: 11/18/2014 Document Reviewed: 10/09/2013 Elsevier Interactive Patient Education 2016 [employer]  

## 2016-01-08 NOTE — ED Notes (Addendum)
Pt arrives to ER via POV c/o productive cough accompanied with mild SOB, sore throat. Denies CP. Denies NVD. Denies fever. Pt alert and oriented X4, active, cooperative, pt in NAD. RR even and unlabored, color WNL.  Family states pt has SOB prior to spitting up thick productive secretions.    Pt mother recently treated for similar sx with "cough medicine and antibiotic" per sister who brings patient.

## 2016-01-08 NOTE — ED Provider Notes (Signed)
[facility] Emergency Department Provider Note   ____________________________________________  Time seen: Approximately 10:37 AM  I have reviewed the triage vital signs and the nursing notes.   HISTORY  Chief Complaint Cough and Sore Throat    HPI Taylor Johnson is a 70 y.o. female who arrives to the emergency department with complaint of productive cough over the last two days. Says cough is worsening and prodcues a white phlegm without hemoptysis. Sister reports that the patient was given an inhaler for pneumonia in February, they cannot remember the name, but attempted to use this for treatment. Has had shortness of breath and dyspnea over the last two days.  Patient has not had any fever, chills or night sweats. Does admit to rhinorrhea, congestion, sore throat, eye pain.    Past Medical History  Diagnosis Date  . Diverticulitis   . Anxiety   . Developmental delay     Patient Active Problem List   Diagnosis Date Noted  . OSA on CPAP 11/18/2015  . Dysphagia 10/15/2015  . Hypoxia 09/12/2015  . Urge incontinence of urine 05/12/2015  . Frequent loose stools 05/12/2015  . Diverticulitis of colon with bleeding 05/11/2015  . Anxiety, generalized 05/11/2015    Past Surgical History  Procedure Laterality Date  . Abdominal hysterectomy      bleeding    Current Outpatient Rx  Name  Route  Sig  Dispense  Refill  . diphenhydrAMINE (BENADRYL) 25 mg capsule   Oral   Take 2 capsules (50 mg total) by mouth every 6 (six) hours as needed.   60 capsule   0   . escitalopram (LEXAPRO) 10 MG tablet      TAKE 1 TABLET BY MOUTH EACH DAY   30 tablet   5   . guaiFENesin-codeine 100-10 MG/5ML syrup   Oral   Take 10 mLs by mouth every 4 (four) hours as needed for cough.   180 mL   0   . solifenacin (VESICARE) 5 MG tablet   Oral   Take 1 tablet (5 mg total) by mouth daily.   30 tablet   5     Allergies Review of patient's allergies indicates no  known allergies.  Family History  Problem Relation Age of Onset  . Hypertension Mother     Social History Social History  Substance Use Topics  . Smoking status: Never Smoker   . Smokeless tobacco: Never Used  . Alcohol Use: No    Review of Systems Constitutional: No fever/chills, night sweats, fatigue, pre-syncope, syncope Eyes: No visual changes or discharge. Positive for ocular pain ENT: Positive for sore throat. No otalgia, otorrhea, dysphagia.  Cardiovascular: Denies chest pain and palpitations Respiratory: Positive for shortness of breath, dyspnea, and productive cough. Gastrointestinal: No abdominal pain.  No nausea, no vomiting.  No diarrhea.  No constipation. Genitourinary: Negative for dysuria, burning with urination, frequency, and urgency Musculoskeletal: Negative for back pain and generalized myalgias Skin: Negative for rash. Neurological: Negative for headaches, focal weakness or numbness.  10-point ROS otherwise negative.  ____________________________________________   PHYSICAL EXAM:  VITAL SIGNS: ED Triage Vitals  Enc Vitals Group     BP 01/08/16 0948 150/69 mmHg     Pulse Rate 01/08/16 0948 74     Resp 01/08/16 0948 20     Temp 01/08/16 0948 97.8 F (36.6 C)     Temp Source 01/08/16 0948 Oral     SpO2 01/08/16 0948 97 %     Weight 01/08/16  0948 275 lb (124.739 kg)     Height --      Head Cir --      Peak Flow --      Pain Score 01/08/16 0949 8     Pain Loc --      Pain Edu? --      Excl. in [place]? --     Constitutional: Alert and oriented. Well appearing and in no acute distress. Head: Atraumatic, normocephalic.  Nose: No congestion/rhinnorhea, no tenderness to palpation over sinuses. Mouth/Throat: Mucous membranes are moist.  Oropharynx pink, tonsils without erythema edema or exudate, midline uvula with normal rise and fall of soft palate. Neck: No stridor. Supple without edema. Trachea midline   Lymphatic/Immunilogical: No cervical or  supraclavicular lymphadenopathy. Cardiovascular: Normal rate, regular rhythm. Grossly normal heart sounds.  Good peripheral circulation. Respiratory: Normal respiratory effort.  No retractions. Lungs CTAB. No accessory muscle use for breathing Musculoskeletal: No lower extremity tenderness nor edema.  No joint effusions. Neurologic:  slowed speech and language. No gross focal neurologic deficits are appreciated. No gait instability. Skin:  Skin is warm, dry and intact. No rash noted. Psychiatric: Mood and affect are normal. Speech and behavior are normal.  ____________________________________________   LABS (all labs ordered are listed, but only abnormal results are displayed)  Labs Reviewed - No data to display ____________________________________________  EKG  None ____________________________________________  RADIOLOGY  Findings: Cardiomegaly without edema is again seen. No consolidative process, pneumothorax or effusion. No bony abnormality.  Impression: cardiomegaly without acute disease. ____________________________________________   PROCEDURES  Procedure(s) performed: None  Critical Care performed: No  ____________________________________________   INITIAL IMPRESSION / ASSESSMENT AND PLAN / ED COURSE  Pertinent labs & imaging results that were available during my care of the patient were reviewed by me and considered in my medical decision making (see chart for details).  Acute Viral URI: Patient given prescription for Robitussin AC d/c home to follow-up with PCP as necessary for continued symptoms. If unable to follow-up with PCP, instructed to return to ED.  ____________________________________________   FINAL CLINICAL IMPRESSION(S) / ED DIAGNOSES  Final diagnoses:  Acute URI      NEW MEDICATIONS STARTED DURING THIS VISIT:  Discharge Medication List as of 01/08/2016 10:49 AM    START taking these medications   Details  guaiFENesin-codeine 100-10  MG/5ML syrup Take 10 mLs by mouth every 4 (four) hours as needed for cough., Starting 01/08/2016, Until Discontinued, Print         Note:  This document was prepared using Dragon voice recognition software and may include unintentional dictation errors.   Arlyss Repress, PA-C 01/08/16 [place], MD 01/08/16 [phone]

## 2016-01-25 ENCOUNTER — Encounter: Payer: Self-pay | Admitting: Internal Medicine

## 2016-01-25 ENCOUNTER — Ambulatory Visit (INDEPENDENT_AMBULATORY_CARE_PROVIDER_SITE_OTHER): Payer: Medicare Other | Admitting: Internal Medicine

## 2016-01-25 VITALS — BP 138/82 | HR 69 | Temp 97.6°F | Resp 16 | Ht 64.0 in | Wt 271.0 lb

## 2016-01-25 DIAGNOSIS — R059 Cough, unspecified: Secondary | ICD-10-CM

## 2016-01-25 DIAGNOSIS — G4733 Obstructive sleep apnea (adult) (pediatric): Secondary | ICD-10-CM

## 2016-01-25 DIAGNOSIS — R05 Cough: Secondary | ICD-10-CM

## 2016-01-25 DIAGNOSIS — Z9989 Dependence on other enabling machines and devices: Secondary | ICD-10-CM

## 2016-01-25 DIAGNOSIS — N3941 Urge incontinence: Secondary | ICD-10-CM | POA: Diagnosis not present

## 2016-01-25 MED ORDER — SOLIFENACIN SUCCINATE 10 MG PO TABS: 10.0000 mg | ORAL_TABLET | Freq: Every day | ORAL | Status: DC

## 2016-01-25 NOTE — Progress Notes (Signed)
Date:  01/25/2016   Name:  Taylor Johnson   DOB:  04/03/1946   MRN:  [id number]   Chief Complaint: Breathing Problem She is having episodes of difficulty breathing which she described as having her wind cut off at her throat.  Last week she had an episode after getting over heated.  These may be occuring several times per week.  They resolved with rest or drinking water.  She has no history of smoking, no asthma or new exposures.  She is still coughing at intervals multiple times per day.  Recent CXR was normal.  Ba Swallow in march was normal. The cough wakes her from sleep.  It is not related to eating or drinking.  Sleep Apnea - still trying to get CPAP equipment. Sister feels that her energy is decreased and she appears to be getting little restful sleep.  Apparently [facility] is in network - a email has been sent.  Urge Incontinence - not much benefit from Vesicare 5 mg.   No dysuria or blood in urine.  No fever or flank pain. Review of Systems  Constitutional: Negative for fever, chills, appetite change, fatigue and unexpected weight change.  HENT: Negative for congestion, postnasal drip, sinus pressure and sore throat.   Respiratory: Positive for cough, chest tightness and shortness of breath. Negative for wheezing.   Cardiovascular: Negative for chest pain, palpitations and leg swelling.  Gastrointestinal: Negative for nausea and abdominal pain.  Genitourinary: Positive for frequency (and urge incontinence not helped much by vesicare 5 mg).  Skin: Negative for color change and rash.  Psychiatric/Behavioral: Negative for sleep disturbance and dysphoric mood.    Patient Active Problem List   Diagnosis Date Noted  . OSA on CPAP 11/18/2015  . Dysphagia 10/15/2015  . Hypoxia 09/12/2015  . Urge incontinence of urine 05/12/2015  . Frequent loose stools 05/12/2015  . Diverticulitis of colon with bleeding 05/11/2015  . Anxiety, generalized 05/11/2015    Prior to Admission  medications   Medication Sig Start Date End Date Taking? Authorizing Provider  guaiFENesin-codeine 100-10 MG/5ML syrup Take 10 mLs by mouth every 4 (four) hours as needed for cough. 01/08/16  Yes Pierce Crane Beers, PA-C  solifenacin (VESICARE) 5 MG tablet Take 1 tablet (5 mg total) by mouth daily. 12/18/15  Yes [name], MD    No Known Allergies  Past Surgical History  Procedure Laterality Date  . Abdominal hysterectomy      bleeding    Social History  Substance Use Topics  . Smoking status: Never Smoker   . Smokeless tobacco: Never Used  . Alcohol Use: No     Medication list has been reviewed and updated.   Physical Exam  Constitutional: She is oriented to person, place, and time. She appears well-developed. No distress.  HENT:  Head: Normocephalic and atraumatic.  Neck: Normal range of motion. Neck supple.  Cardiovascular: Normal rate, regular rhythm and normal heart sounds.   Pulmonary/Chest: Effort normal and breath sounds normal. No respiratory distress. She has no wheezes. She has no rales.  Musculoskeletal: Normal range of motion. She exhibits no edema.  Lymphadenopathy:    She has no cervical adenopathy.  Neurological: She is alert and oriented to person, place, and time.  Skin: Skin is warm and dry. No rash noted.  Psychiatric: She has a normal mood and affect. Her speech is normal and behavior is normal.  Nursing note and vitals reviewed.   BP 138/82 mmHg  Pulse 69  Temp(Src) 97.6 F (36.4 C) (Oral)  Resp 16  Ht 5\' 4"  (1.626 m)  Wt 271 lb (122.925 kg)  BMI 46.49 kg/m2  SpO2 98%  Assessment and Plan: 1. OSA on CPAP CPAP not yet started due to insurance issues - Ambulatory referral to Pulmonology  2. Cough May be adult onset asthma but needs further evaluation and testing I don't feel that Modified Ba Swallow would be helpful - Ambulatory referral to Pulmonology  3. Urge incontinence of urine Will try higher dose vesicare for one month -  solifenacin (VESICARE) 10 MG tablet; Take 1 tablet (10 mg total) by mouth daily.  Dispense: 30 tablet; Refill: [place], MD [facility]  01/25/2016

## 2016-01-29 ENCOUNTER — Telehealth: Payer: Self-pay

## 2016-01-29 NOTE — Telephone Encounter (Signed)
Sent orders to [facility] for CPAP

## 2016-02-08 ENCOUNTER — Emergency Department
Admission: EM | Admit: 2016-02-08 | Discharge: 2016-02-09 | Disposition: A | Discharge status: Home / Self Care | Payer: Medicare Other | Attending: Emergency Medicine | Admitting: Emergency Medicine

## 2016-02-08 ENCOUNTER — Emergency Department: Payer: Medicare Other

## 2016-02-08 DIAGNOSIS — M542 Cervicalgia: Secondary | ICD-10-CM | POA: Insufficient documentation

## 2016-02-08 DIAGNOSIS — M25511 Pain in right shoulder: Secondary | ICD-10-CM | POA: Diagnosis present

## 2016-02-08 DIAGNOSIS — Z79899 Other long term (current) drug therapy: Secondary | ICD-10-CM | POA: Insufficient documentation

## 2016-02-08 HISTORY — DX: Unspecified asthma, uncomplicated: J45.909

## 2016-02-08 MED ORDER — ACETAMINOPHEN 325 MG PO TABS
ORAL_TABLET | ORAL | Status: AC
  Administered 2016-02-08: 650 mg via ORAL
  Filled 2016-02-08: qty 2

## 2016-02-08 MED ORDER — ACETAMINOPHEN 325 MG PO TABS
650.0000 mg | ORAL_TABLET | Freq: Once | ORAL | Status: AC
  Administered 2016-02-08: 650 mg via ORAL

## 2016-02-08 NOTE — ED Triage Notes (Signed)
Pt BIB EMS w/ c/o R shoulder pain.  Per EMS, pt called out earlier today for same, did not come in b/c pain eased.  Per EMS, pts R shoulder pain returned.  Pt alert and oriented to situation.  Pt from home and lives w/ family.  Family on way per EMS.  Pt rates pain 7/10, denies injury.

## 2016-02-08 NOTE — ED Notes (Signed)
Pt returned from CT °

## 2016-02-08 NOTE — ED Provider Notes (Signed)
[facility] Emergency Department Provider Note   ____________________________________________  Time seen: Seen upon arrival to the emergency department  I have reviewed the triage vital signs and the nursing notes.   HISTORY  Chief Complaint No chief complaint on file.   HPI Taylor Johnson is a 70 y.o. female with a history of anxiety as well as developmental delay who is presenting to the emergency department with 1 day of right shoulder pain. The patient has had intermittent right shoulder pain as well as pain to the right side base of the neck off and on. She denies any chest pain or shortness of breath. Family is at the bedside and denies any injury. They said that she has been spending a lot of time outside and her concern for symptoms related to heat stroke.   Past Medical History:  Diagnosis Date  . Anxiety   . Developmental delay   . Diverticulitis     Patient Active Problem List   Diagnosis Date Noted  . OSA on CPAP 11/18/2015  . Dysphagia 10/15/2015  . Hypoxia 09/12/2015  . Urge incontinence of urine 05/12/2015  . Frequent loose stools 05/12/2015  . Diverticulitis of colon with bleeding 05/11/2015  . Anxiety, generalized 05/11/2015    Past Surgical History:  Procedure Laterality Date  . ABDOMINAL HYSTERECTOMY     bleeding    Current Outpatient Rx  . Order #: [id number] Class: Historical Med  . Order #: [id number] Class: Print  . Order #: [id number] Class: Normal    Allergies Review of patient's allergies indicates no known allergies.  Family History  Problem Relation Age of Onset  . Hypertension Mother     Social History Social History  Substance Use Topics  . Smoking status: Never Smoker  . Smokeless tobacco: Never Used  . Alcohol use No    Review of Systems Constitutional: No fever/chills Eyes: No visual changes. ENT: No sore throat. Cardiovascular: Denies chest pain. Respiratory: Denies shortness of  breath. Gastrointestinal: No abdominal pain.  No nausea, no vomiting.  No diarrhea.  No constipation. Genitourinary: Negative for dysuria. Musculoskeletal: Negative for back pain. Skin: Negative for rash. Neurological: Negative for headaches, focal weakness or numbness.  10-point ROS otherwise negative.  ____________________________________________   PHYSICAL EXAM:  VITAL SIGNS: ED Triage Vitals  Enc Vitals Group     BP      Pulse      Resp      Temp      Temp src      SpO2      Weight      Height      Head Circumference      Peak Flow      Pain Score      Pain Loc      Pain Edu?      Excl. in [place]?     Constitutional: Alert and oriented. Well appearing and in no acute distress. Eyes: Conjunctivae are normal. PERRL. EOMI. Head: Atraumatic. Nose: No congestion/rhinnorhea. Mouth/Throat: Mucous membranes are moist.   Neck: No stridor.  No tenderness to the midline C-spine. Patient is able to range neck fully. No tenderness along the distribution of the trapezius. Cardiovascular: Normal rate, regular rhythm. Grossly normal heart sounds.  Good peripheral circulation with equal and bilateral radial pulses. Respiratory: Normal respiratory effort.  No retractions. Lungs CTAB. Gastrointestinal: Soft and nontender. No distention. No abdominal bruits. No CVA tenderness. Musculoskeletal: No lower extremity tenderness nor edema.  No joint effusions.  Right shoulder without  deformity, tenderness or effusion. Full active range of motion with 5 out of 5 strength. Sensation is intact to light touch. Neurologic:  Normal speech and language. No gross focal neurologic deficits are appreciated.  Skin:  Skin is warm, dry and intact. No rash noted. Psychiatric: Mood and affect are normal. Speech and behavior are normal.  ____________________________________________   LABS (all labs ordered are listed, but only abnormal results are displayed)  Labs Reviewed - No data to  display ____________________________________________  EKG   ____________________________________________  RADIOLOGY  CLINICAL DATA:  Right shoulder pain. EXAM: RIGHT SHOULDER - 2+ VIEW COMPARISON:  None. FINDINGS: There is no evidence of fracture or dislocation. 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 Soft tissues are unremarkable. Visualized portion of the right lung is clear. IMPRESSION: No acute fracture or dislocation identified about the right shoulder. Electronically Signed   By: Fidela Salisbury M.D.   On: 02/08/2016 20:41 ____________________________________________   PROCEDURES   Procedures   ____________________________________________   INITIAL IMPRESSION / ASSESSMENT AND PLAN / ED COURSE  Pertinent labs & imaging results that were available during my care of the patient were reviewed by me and considered in my medical decision making (see chart for details).  ----------------------------------------- 11:28 PM on 02/08/2016 -----------------------------------------  Patient initially given 650 of Tylenol and has been sleeping for the majority of her stay. Still pending CAT scan of the cervical spine. Signed out to Dr. Dahlia Client.  Clinical Course     ____________________________________________   FINAL CLINICAL IMPRESSION(S) / ED DIAGNOSES  Right shoulder pain.    NEW MEDICATIONS STARTED DURING THIS VISIT:  New Prescriptions   No medications on file     Note:  This document was prepared using Dragon voice recognition software and may include unintentional dictation errors.    Orbie Pyo, MD 02/08/16 [phone]

## 2016-02-09 MED ORDER — ETODOLAC 200 MG PO CAPS: 200.0000 mg | ORAL_CAPSULE | Freq: Three times a day (TID) | ORAL | 0 refills | Status: DC

## 2016-02-09 NOTE — ED Provider Notes (Signed)
-----------------------------------------   2:09 AM on 02/09/2016 -----------------------------------------   Blood pressure (!) 155/86, pulse 65, temperature 98.5 F (36.9 C), temperature source Oral, resp. rate 20, SpO2 95 %.  Assuming care from Dr. Clearnce Hasten.  In short, Taylor Johnson is a 70 y.o. female with a chief complaint of Shoulder Pain .  Refer to the original H&P for additional details.  The current plan of care is to follow up the result of the CT scan.  CT cervical spine: No fracture or static subluxation of the cervical spine. Degenerative change of the atlantodental articulation, otherwise, no explanation for patient's worsening neck pain. Further evaluation with nonemergent cervical spine MRI could performed as clinically Indicated.  The patient is not having any shoulder or neck pain at this time. I will discharge the patient home to have her follow-up with her primary care physician. The patient has no further questions or concerns.    Loney Hering, MD 02/09/16 [phone]

## 2016-02-23 ENCOUNTER — Telehealth: Payer: Self-pay

## 2016-02-23 NOTE — Telephone Encounter (Signed)
Patient's sister, [name], came by the office today and would like to know if her sister's anxiety medication can be increased due to having a hard time settling down during the day and night. Please contact Ms. Primus Bravo back at your earlier convenience. Thanks!

## 2016-02-24 NOTE — Telephone Encounter (Signed)
Advised OV as we have never given Anxiety meds and Lexapro is listed but we did not write this.

## 2016-02-26 ENCOUNTER — Other Ambulatory Visit: Payer: Self-pay | Admitting: Internal Medicine

## 2016-02-26 ENCOUNTER — Encounter: Payer: Self-pay | Admitting: Internal Medicine

## 2016-02-26 ENCOUNTER — Ambulatory Visit (INDEPENDENT_AMBULATORY_CARE_PROVIDER_SITE_OTHER): Payer: Medicare Other | Admitting: Internal Medicine

## 2016-02-26 VITALS — BP 136/74 | HR 78 | Resp 16 | Ht 64.0 in | Wt 269.0 lb

## 2016-02-26 DIAGNOSIS — F411 Generalized anxiety disorder: Secondary | ICD-10-CM | POA: Diagnosis not present

## 2016-02-26 DIAGNOSIS — G4733 Obstructive sleep apnea (adult) (pediatric): Secondary | ICD-10-CM

## 2016-02-26 DIAGNOSIS — Z9989 Dependence on other enabling machines and devices: Secondary | ICD-10-CM

## 2016-02-26 MED ORDER — MIRTAZAPINE 15 MG PO TBDP: 15.0000 mg | ORAL_TABLET | Freq: Every day | ORAL | 2 refills | Status: DC

## 2016-02-26 NOTE — Progress Notes (Signed)
    Date:  02/26/2016   Name:  Taylor Johnson   DOB:  Mar 10, 1946   MRN:  [id number]   Chief Complaint: Depression (follow up) Per family, she is up and down all night, restless, cooking and eating.  She gets angry easily during the day, esp remembering a traumatic event.  She acts as if she wants to fight or hit someone but has not done so.  She continues to live with her 69 y/o mother.  She takes Lexapro daily.   Depression         This is a chronic problem.  The current episode started more than 1 year ago.   The problem occurs daily.  The problem has been gradually worsening since onset.  Associated symptoms include irritable and restlessness.  Associated symptoms include no fatigue.  Past treatments include SSRIs - Selective serotonin reuptake inhibitors and psychotherapy.  Compliance with treatment is good.  Previous treatment provided mild relief.     Review of Systems  Constitutional: Negative for chills, fatigue and fever.  Respiratory: Positive for cough. Negative for choking and chest tightness.   Cardiovascular: Negative for chest pain, palpitations and leg swelling.  Psychiatric/Behavioral: Positive for agitation, behavioral problems, depression, dysphoric mood and sleep disturbance. Negative for hallucinations. The patient is nervous/anxious.     Patient Active Problem List   Diagnosis Date Noted  . OSA on CPAP 11/18/2015  . Dysphagia 10/15/2015  . Hypoxia 09/12/2015  . Urge incontinence of urine 05/12/2015  . Frequent loose stools 05/12/2015  . Diverticulitis of colon with bleeding 05/11/2015  . Anxiety, generalized 05/11/2015    Prior to Admission medications   Medication Sig Start Date End Date Taking? Authorizing Provider  escitalopram (LEXAPRO) 10 MG tablet Take 10 mg by mouth daily.   Yes Historical Provider, MD  etodolac (LODINE) 200 MG capsule Take 1 capsule (200 mg total) by mouth every 8 (eight) hours. 02/09/16  Yes Loney Hering, MD  fluticasone (FLONASE)  50 MCG/ACT nasal spray Place into the nose. 02/15/16 02/14/17 Yes Historical Provider, MD    No Known Allergies  Past Surgical History:  Procedure Laterality Date  . ABDOMINAL HYSTERECTOMY     bleeding    Social History  Substance Use Topics  . Smoking status: Never Smoker  . Smokeless tobacco: Never Used  . Alcohol use No     Medication list has been reviewed and updated.   Physical Exam  Constitutional: She appears well-developed. She is irritable. No distress.  HENT:  Head: Normocephalic and atraumatic.  Pulmonary/Chest: Effort normal. No respiratory distress.  Musculoskeletal: Normal range of motion.  Neurological: She is alert.  Skin: Skin is warm and dry. No rash noted.  Psychiatric: Her affect is blunt. Her speech is delayed. She is withdrawn. She is not agitated, not aggressive and not combative. She is inattentive (and minimally vocal - ).    BP 136/74 (BP Location: Left Arm, Patient Position: Sitting, Cuff Size: Large)   Pulse 78   Resp 16   Ht 5\' 4"  (1.626 m)   Wt 269 lb (122 kg)   SpO2 97%   BMI 46.17 kg/m   Assessment and Plan: 1. Anxiety, generalized Will add remeron at [facility] Consider Psych referral - mirtazapine (REMERON SOL-TAB) 15 MG disintegrating tablet; Take 1 tablet (15 mg total) by mouth at bedtime.  Dispense: 30 tablet; Refill: [place], MD [facility]  02/26/2016

## 2016-02-29 DIAGNOSIS — R0609 Other forms of dyspnea: Secondary | ICD-10-CM | POA: Diagnosis not present

## 2016-02-29 DIAGNOSIS — G4733 Obstructive sleep apnea (adult) (pediatric): Secondary | ICD-10-CM | POA: Diagnosis not present

## 2016-02-29 DIAGNOSIS — R05 Cough: Secondary | ICD-10-CM | POA: Diagnosis not present

## 2016-03-01 DIAGNOSIS — R0609 Other forms of dyspnea: Secondary | ICD-10-CM | POA: Diagnosis not present

## 2016-03-03 DIAGNOSIS — G4733 Obstructive sleep apnea (adult) (pediatric): Secondary | ICD-10-CM | POA: Diagnosis not present

## 2016-04-03 DIAGNOSIS — G4733 Obstructive sleep apnea (adult) (pediatric): Secondary | ICD-10-CM | POA: Diagnosis not present

## 2016-04-05 ENCOUNTER — Ambulatory Visit (INDEPENDENT_AMBULATORY_CARE_PROVIDER_SITE_OTHER): Payer: Medicare Other | Admitting: Internal Medicine

## 2016-04-05 ENCOUNTER — Encounter: Payer: Self-pay | Admitting: Internal Medicine

## 2016-04-05 VITALS — BP 122/82 | HR 72 | Resp 16 | Ht 64.0 in | Wt 284.0 lb

## 2016-04-05 DIAGNOSIS — R0902 Hypoxemia: Secondary | ICD-10-CM

## 2016-04-05 DIAGNOSIS — Z9989 Dependence on other enabling machines and devices: Secondary | ICD-10-CM

## 2016-04-05 DIAGNOSIS — G4733 Obstructive sleep apnea (adult) (pediatric): Secondary | ICD-10-CM

## 2016-04-05 DIAGNOSIS — Z23 Encounter for immunization: Secondary | ICD-10-CM | POA: Diagnosis not present

## 2016-04-05 DIAGNOSIS — F411 Generalized anxiety disorder: Secondary | ICD-10-CM

## 2016-04-05 NOTE — Progress Notes (Signed)
Date:  04/05/2016   Name:  Taylor Johnson   DOB:  12/31/1945   MRN:  [id number]   Chief Complaint: Sleep Apnea We discussed the benefits of regular CPAP use. She has been using it some of the time but admits to being lazy. She promises to do better.  There is no one at home who can help her to remember.  She believes that she rests well when she does use the CPAP. Her cough is improved after seeing Dr. Raul Del.  She is now using an albuterol inhaler.   Review of Systems  Constitutional: Negative for appetite change, fatigue, fever and unexpected weight change.  HENT: Negative for tinnitus and trouble swallowing.   Respiratory: Positive for cough. Negative for chest tightness, shortness of breath and wheezing.   Cardiovascular: Negative for chest pain, palpitations and leg swelling.  Gastrointestinal: Negative for abdominal pain.  Endocrine: Negative for polydipsia and polyuria.  Genitourinary: Negative for dysuria and hematuria.  Musculoskeletal: Negative for arthralgias.  Neurological: Negative for tremors, numbness and headaches.  Hematological: Negative for adenopathy.  Psychiatric/Behavioral: Negative for dysphoric mood.    Patient Active Problem List   Diagnosis Date Noted  . OSA on CPAP 11/18/2015  . Dysphagia 10/15/2015  . Hypoxia 09/12/2015  . Urge incontinence of urine 05/12/2015  . Frequent loose stools 05/12/2015  . Diverticulitis of colon with bleeding 05/11/2015  . Anxiety, generalized 05/11/2015    Prior to Admission medications   Medication Sig Start Date End Date Taking? Authorizing Provider  escitalopram (LEXAPRO) 10 MG tablet Take 10 mg by mouth daily.   Yes Historical Provider, MD  etodolac (LODINE) 200 MG capsule Take 1 capsule (200 mg total) by mouth every 8 (eight) hours. 02/09/16  Yes Loney Hering, MD  fluticasone (FLONASE) 50 MCG/ACT nasal spray Place into the nose. 02/15/16 02/14/17 Yes Historical Provider, MD  mirtazapine (REMERON SOL-TAB) 15 MG  disintegrating tablet Take 1 tablet (15 mg total) by mouth at bedtime. 02/26/16  Yes [name], MD  omeprazole (PRILOSEC) 20 MG capsule Take 20 mg by mouth daily.   Yes Historical Provider, MD    No Known Allergies  Past Surgical History:  Procedure Laterality Date  . ABDOMINAL HYSTERECTOMY     bleeding    Social History  Substance Use Topics  . Smoking status: Never Smoker  . Smokeless tobacco: Never Used  . Alcohol use No     Medication list has been reviewed and updated.   Physical Exam  Constitutional: She is oriented to person, place, and time. She appears well-developed. No distress.  HENT:  Head: Normocephalic and atraumatic.  Pulmonary/Chest: Effort normal. No respiratory distress.  Musculoskeletal: She exhibits no edema or tenderness.  Neurological: She is alert and oriented to person, place, and time.  Skin: Skin is warm and dry. No rash noted.  Psychiatric: She has a normal mood and affect. Her behavior is normal. Thought content normal.  Nursing note and vitals reviewed.   BP 122/82   Pulse 72   Resp 16   Ht 5\' 4"  (1.626 m)   Wt 284 lb (128.8 kg)   SpO2 97%   BMI 48.75 kg/m   Assessment and Plan: 1. OSA on CPAP Discussed improved compliance Will contact equipment supplier for forms  2. Anxiety, generalized stable  3. Hypoxia Followed by Pulmonary  4. Need for influenza vaccination - Flu Vaccine QUAD 36+ mos IM  5. Need for pneumococcal vaccination - Pneumococcal conjugate vaccine 13-valent IM  Halina Maidens, MD [facility]  04/05/2016

## 2016-04-05 NOTE — Patient Instructions (Addendum)
Pneumococcal Conjugate Vaccine (PCV13)   1. Why get vaccinated?  Vaccination can protect both children and adults from pneumococcal disease.  Pneumococcal disease is caused by bacteria that can spread from person to person through close contact. It can cause ear infections, and it can also lead to more serious infections of the:  · Lungs (pneumonia),  · Blood (bacteremia), and  · Covering of the brain and spinal cord (meningitis).  Pneumococcal pneumonia is most common among adults. Pneumococcal meningitis can cause deafness and brain damage, and it kills about 1 child in 10 who get it.  Anyone can get pneumococcal disease, but children under 2 years of age and adults 65 years and older, people with certain medical conditions, and cigarette smokers are at the highest risk.  Before there was a vaccine, the [place] saw:  · more than 700 cases of meningitis,  · about 13,000 blood infections,  · about 5 million ear infections, and  · about 200 deaths  in children under 5 each year from pneumococcal disease. Since vaccine became available, severe pneumococcal disease in these children has fallen by 88%.  About 18,000 older adults die of pneumococcal disease each year in the [place].  Treatment of pneumococcal infections with penicillin and other drugs is not as effective as it used to be, because some strains of the disease have become resistant to these drugs. This makes prevention of the disease, through vaccination, even more important.  2. PCV13 vaccine  Pneumococcal conjugate vaccine (called PCV13) protects against 13 types of pneumococcal bacteria.  PCV13 is routinely given to children at 2, 4, 6, and 12-15 months of age. It is also recommended for children and adults 2 to 64 years of age with certain health conditions, and for all adults 65 years of age and older. Your doctor can give you details.  3. Some people should not get this vaccine  Anyone who has ever had a life-threatening allergic reaction  to a dose of this vaccine, to an earlier pneumococcal vaccine called PCV7, or to any vaccine containing diphtheria toxoid (for example, DTaP), should not get PCV13.  Anyone with a severe allergy to any component of PCV13 should not get the vaccine. Tell your doctor if the person being vaccinated has any severe allergies.  If the person scheduled for vaccination is not feeling well, your healthcare provider might decide to reschedule the shot on another day.  4. Risks of a vaccine reaction  With any medicine, including vaccines, there is a chance of reactions. These are usually mild and go away on their own, but serious reactions are also possible.  Problems reported following PCV13 varied by age and dose in the series. The most common problems reported among children were:  · About half became drowsy after the shot, had a temporary loss of appetite, or had redness or tenderness where the shot was given.  · About 1 out of 3 had swelling where the shot was given.  · About 1 out of 3 had a mild fever, and about 1 in 20 had a fever over 102.2°F.  · Up to about 8 out of 10 became fussy or irritable.  Adults have reported pain, redness, and swelling where the shot was given; also mild fever, fatigue, headache, chills, or muscle pain.  Young children who get PCV13 along with inactivated flu vaccine at the same time may be at increased risk for seizures caused by fever. Ask your doctor for more information.  Problems that   could happen after any vaccine:  · People sometimes faint after a medical procedure, including vaccination. Sitting or lying down for about 15 minutes can help prevent fainting, and injuries caused by a fall. Tell your doctor if you feel dizzy, or have vision changes or ringing in the ears.  · Some older children and adults get severe pain in the shoulder and have difficulty moving the arm where a shot was given. This happens very rarely.  · Any medication can cause a severe allergic reaction. Such  reactions from a vaccine are very rare, estimated at about 1 in a million doses, and would happen within a few minutes to a few hours after the vaccination.  As with any medicine, there is a very small chance of a vaccine causing a serious injury or death.  The safety of vaccines is always being monitored. For more information, visit: www.cdc.gov/vaccinesafety/  5. What if there is a serious reaction?  What should I look for?  · Look for anything that concerns you, such as signs of a severe allergic reaction, very high fever, or unusual behavior.  Signs of a severe allergic reaction can include hives, swelling of the face and throat, difficulty breathing, a fast heartbeat, dizziness, and weakness-usually within a few minutes to a few hours after the vaccination.  What should I do?  · If you think it is a severe allergic reaction or other emergency that can't wait, call 9-1-1 or get the person to the nearest hospital. Otherwise, call your doctor.  Reactions should be reported to the Vaccine Adverse Event Reporting System (VAERS). Your doctor should file this report, or you can do it yourself through the VAERS web site at www.vaers.hhs.gov, or by calling 1-800-822-7967.  VAERS does not give medical advice.  6. The National Vaccine Injury Compensation Program  The National Vaccine Injury Compensation Program (VICP) is a federal program that was created to compensate people who may have been injured by certain vaccines.  Persons who believe they may have been injured by a vaccine can learn about the program and about filing a claim by calling 1-800-338-2382 or visiting the VICP website at www.hrsa.gov/vaccinecompensation. There is a time limit to file a claim for compensation.  7. How can I learn more?  · Ask your healthcare provider. He or she can give you the vaccine package insert or suggest other sources of information.  · Call your local or state health department.  · Contact the Centers for Disease Control and  Prevention (CDC):    Call 1-800-232-4636 (1-800-CDC-INFO) or    Visit CDC's website at www.cdc.gov/vaccines  Vaccine Information Statement  PCV13 Vaccine (05/22/2014)     This information is not intended to replace advice given to you by your health care provider. Make sure you discuss any questions you have with your health care provider.     Document Released: 05/01/2006 Document Revised: 07/25/2014 Document Reviewed: 05/29/2014  Elsevier Interactive Patient Education ©2016 [employer]

## 2016-04-15 ENCOUNTER — Ambulatory Visit (INDEPENDENT_AMBULATORY_CARE_PROVIDER_SITE_OTHER): Payer: Medicare Other | Admitting: Internal Medicine

## 2016-04-15 ENCOUNTER — Encounter: Payer: Self-pay | Admitting: Internal Medicine

## 2016-04-15 VITALS — BP 128/84 | HR 84 | Resp 16 | Ht 64.0 in | Wt 280.0 lb

## 2016-04-15 DIAGNOSIS — R350 Frequency of micturition: Secondary | ICD-10-CM | POA: Diagnosis not present

## 2016-04-15 DIAGNOSIS — R3129 Other microscopic hematuria: Secondary | ICD-10-CM | POA: Insufficient documentation

## 2016-04-15 DIAGNOSIS — R600 Localized edema: Secondary | ICD-10-CM

## 2016-04-15 DIAGNOSIS — G4733 Obstructive sleep apnea (adult) (pediatric): Secondary | ICD-10-CM | POA: Diagnosis not present

## 2016-04-15 DIAGNOSIS — Z9989 Dependence on other enabling machines and devices: Secondary | ICD-10-CM

## 2016-04-15 HISTORY — DX: Other microscopic hematuria: R31.29

## 2016-04-15 HISTORY — DX: Localized edema: R60.0

## 2016-04-15 LAB — POC URINALYSIS WITH MICROSCOPIC (NON AUTO)MANUAL RESULT
Bilirubin, UA: NEGATIVE
Crystals: 0
Epithelial cells, urine per micros: 3
Glucose, UA: NEGATIVE
Ketones, UA: NEGATIVE
Mucus, UA: 0
Nitrite, UA: POSITIVE
Protein, UA: NEGATIVE
RBC: 5 M/uL (ref 4.04–5.48)
Spec Grav, UA: 1.01
Urobilinogen, UA: 0.2
WBC Casts, UA: 2
pH, UA: 7

## 2016-04-15 NOTE — Patient Instructions (Signed)
Drink mostly water rather than soda every day  Use CPAP every night!  Elevate legs when sitting  Stop eating salty chips and crackers

## 2016-04-15 NOTE — Progress Notes (Signed)
Date:  04/15/2016   Name:  Taylor Johnson   DOB:  1946-05-30   MRN:  [id number]   Chief Complaint: Foot Swelling (Ankle swelling ) and Hematuria Patient complains of swelling in both feet over the past week.  She is eating cheese puffs and crackers and drinking soda.  She does not elevate her feet when sitting.  She denies shortness of breath and chest pains.  She is using her CPAP some of the time. She is also having urinary frequency but no dysuria.  She has not seen blood in her urine.  There has been no foul odor or cloudiness. Review of UAs over the last three years reveal persistently positive blood.  Each done by different providers in different settings.   Review of Systems  Constitutional: Negative for chills, fatigue and fever.  Respiratory: Negative for chest tightness, shortness of breath and wheezing.   Cardiovascular: Positive for leg swelling. Negative for chest pain and palpitations.  Gastrointestinal: Negative for abdominal pain.  Genitourinary: Positive for frequency. Negative for dysuria, hematuria and pelvic pain.  Skin: Negative for rash.    Patient Active Problem List   Diagnosis Date Noted  . OSA on CPAP 11/18/2015  . Dysphagia 10/15/2015  . Hypoxia 09/12/2015  . Urge incontinence of urine 05/12/2015  . Frequent loose stools 05/12/2015  . Diverticulitis of colon with bleeding 05/11/2015  . Anxiety, generalized 05/11/2015    Prior to Admission medications   Medication Sig Start Date End Date Taking? Authorizing Provider  escitalopram (LEXAPRO) 10 MG tablet Take 10 mg by mouth daily.   Yes Historical Provider, MD  etodolac (LODINE) 200 MG capsule Take 1 capsule (200 mg total) by mouth every 8 (eight) hours. 02/09/16  Yes Loney Hering, MD  fluticasone (FLONASE) 50 MCG/ACT nasal spray Place into the nose. 02/15/16 02/14/17 Yes Historical Provider, MD  mirtazapine (REMERON SOL-TAB) 15 MG disintegrating tablet Take 1 tablet (15 mg total) by mouth at bedtime.  02/26/16  Yes [name], MD  omeprazole (PRILOSEC) 20 MG capsule Take 20 mg by mouth daily.   Yes Historical Provider, MD    No Known Allergies  Past Surgical History:  Procedure Laterality Date  . ABDOMINAL HYSTERECTOMY     bleeding    Social History  Substance Use Topics  . Smoking status: Never Smoker  . Smokeless tobacco: Never Used  . Alcohol use No     Medication list has been reviewed and updated.   Physical Exam  Constitutional: She is oriented to person, place, and time. She appears well-developed. No distress.  HENT:  Head: Normocephalic and atraumatic.  Cardiovascular: Normal rate, regular rhythm and normal heart sounds.   Pulmonary/Chest: Effort normal and breath sounds normal. No respiratory distress. She has no wheezes. She has no rales.  Musculoskeletal: Normal range of motion. She exhibits edema (trace ankle edema). She exhibits no tenderness.  Neurological: She is alert and oriented to person, place, and time.  Skin: Skin is warm and dry. No rash noted.  Psychiatric: She has a normal mood and affect. Her behavior is normal. Thought content normal.  Nursing note and vitals reviewed.   BP 128/84   Pulse 84   Resp 16   Ht 5\' 4"  (1.626 m)   Wt 280 lb (127 kg)   SpO2 97%   BMI 48.06 kg/m   Assessment and Plan: 1. Microscopic hematuria Persistently present over the last three years with no workup - Ambulatory referral to Urology  2.  Localized edema Elevate, increase free water intake, avoid salty snacks  3. OSA on CPAP Stressed need to use nightly  4. Frequency of urination No evidence of bacterial infection - POC urinalysis w microscopic (non auto)   Halina Maidens, MD [facility]  04/15/2016

## 2016-04-20 ENCOUNTER — Other Ambulatory Visit: Payer: Self-pay | Admitting: *Deleted

## 2016-04-20 DIAGNOSIS — R3129 Other microscopic hematuria: Secondary | ICD-10-CM

## 2016-04-22 ENCOUNTER — Other Ambulatory Visit
Admission: RE | Admit: 2016-04-22 | Discharge: 2016-04-22 | Disposition: A | Discharge status: Home / Self Care | Payer: Medicare Other | Source: Ambulatory Visit | Attending: Urology | Admitting: Urology

## 2016-04-22 ENCOUNTER — Ambulatory Visit (INDEPENDENT_AMBULATORY_CARE_PROVIDER_SITE_OTHER): Payer: Medicare Other | Admitting: Urology

## 2016-04-22 ENCOUNTER — Encounter: Payer: Self-pay | Admitting: Urology

## 2016-04-22 VITALS — BP 139/66 | HR 72 | Ht 66.0 in | Wt 267.0 lb

## 2016-04-22 DIAGNOSIS — R3129 Other microscopic hematuria: Secondary | ICD-10-CM

## 2016-04-22 DIAGNOSIS — R351 Nocturia: Secondary | ICD-10-CM

## 2016-04-22 LAB — URINALYSIS COMPLETE WITH MICROSCOPIC (ARMC ONLY)
Bacteria, UA: NONE SEEN
Bilirubin Urine: NEGATIVE
Glucose, UA: NEGATIVE mg/dL
Ketones, ur: NEGATIVE mg/dL
Nitrite: NEGATIVE
Protein, ur: NEGATIVE mg/dL
Specific Gravity, Urine: 1.015 (ref 1.005–1.030)
pH: 5.5 (ref 5.0–8.0)

## 2016-04-22 LAB — BLADDER SCAN AMB NON-IMAGING: Scan Result: 82

## 2016-04-22 LAB — CREATININE, SERUM
Creatinine, Ser: 0.88 mg/dL (ref 0.44–1.00)
GFR calc Af Amer: >60 mL/min (ref >60)
GFR calc non Af Amer: >60 mL/min (ref >60)

## 2016-04-22 LAB — BUN: BUN: 29 mg/dL — ABNORMAL HIGH (ref 6–20)

## 2016-04-22 NOTE — Progress Notes (Signed)
04/22/2016 10:28 AM   Laqueta Carina 1946/03/11 [id number]  Referring provider: [name], MD [address] [place], [place] [address]  Chief Complaint  Patient presents with  . New Patient (Initial Visit)    Hematuria reffered by Dr. Arby Barrette    HPI: Patient is a 70 -year-old [place] American female with developmental delay who presents today with her sister, [name], as a referral from their PCP, Dr. Army Melia, for microscopic hematuria.  Patient was found to have microscopic hematuria on 04/15/2016 with 5 RBC's/hpf.  Patient doesn't have a prior history of microscopic hematuria.    She does not have a prior history of recurrent urinary tract infections, nephrolithiasis, trauma to the genitourinary tract or malignancies of the genitourinary tract.   She does have a family medical history of nephrolithiasis (mother), but no family history of malignancies of the genitourinary tract or hematuria.   Today, she is having symptoms of frequent urination, urgency, nocturia x 4, (has a CPAP machine but she is not using it) incontinence (4 pads daily), but she is not having dysuria, gross hematuria, hesitancy, intermittency, straining to urinate or a weak urinary stream.  Her UA today demonstrates 0-5 RBC's/hpf.  Her PVR is 82 mL.    She is not experiencing any suprapubic pain, abdominal pain or flank pain.  She denies any recent fevers, chills, nausea or vomiting.   She had CT's with contrast in 04/2015 and 08/2015.  They demonstrated no acute abnormality seen within the abdomen or pelvis.  Diffuse diverticulosis along the entirety of the colon, without  evidence of diverticulitis.  Mildly enlarged uterus, with multiple fibroids, some of which demonstrate calcification.  Mild bibasilar atelectasis noted. A left renal cyst.   I have independently reviewed the films.   She is not a smoker.  She has a remote history of being exposed to secondhand smoke.  She not worked with [employer].       PMH: Past Medical History:  Diagnosis Date  . Anxiety   . Asthma   . Depression   . Developmental delay   . Diverticulitis   . Leg swelling     Surgical History: Past Surgical History:  Procedure Laterality Date  . ABDOMINAL HYSTERECTOMY     bleeding    Home Medications:    Medication List       Accurate as of 04/22/16 10:28 AM. Always use your most recent med list.          escitalopram 10 MG tablet Commonly known as:  LEXAPRO Take 10 mg by mouth daily.   etodolac 200 MG capsule Commonly known as:  LODINE Take 1 capsule (200 mg total) by mouth every 8 (eight) hours.   fluticasone 50 MCG/ACT nasal spray Commonly known as:  FLONASE Place into the nose.   mirtazapine 15 MG disintegrating tablet Commonly known as:  REMERON SOL-TAB Take 1 tablet (15 mg total) by mouth at bedtime.   omeprazole 20 MG capsule Commonly known as:  PRILOSEC Take 20 mg by mouth daily.       Allergies: No Known Allergies  Family History: Family History  Problem Relation Age of Onset  . Hypertension Mother   . Kidney disease Mother   . Bladder Cancer Neg Hx     Social History:  reports that she has never smoked. She has never used smokeless tobacco. She reports that she does not drink alcohol or use drugs.  ROS: UROLOGY Frequent Urination?: No Hard to postpone  urination?: No Burning/pain with urination?: No Get up at night to urinate?: No Leakage of urine?: No Urine stream starts and stops?: No Trouble starting stream?: No Do you have to strain to urinate?: No Blood in urine?: No Urinary tract infection?: No Sexually transmitted disease?: No Injury to kidneys or bladder?: No Painful intercourse?: No Weak stream?: No Currently pregnant?: No Vaginal bleeding?: No Last menstrual period?: n  Gastrointestinal Nausea?: No Vomiting?: No Indigestion/heartburn?: No Diarrhea?: No Constipation?: No  Constitutional Fever: No Night sweats?:  No Weight loss?: No Fatigue?: No  Skin Skin rash/lesions?: No Itching?: No  Eyes Blurred vision?: No Double vision?: No  Ears/Nose/Throat Sore throat?: No Sinus problems?: No  Hematologic/Lymphatic Swollen glands?: No Easy bruising?: No  Cardiovascular Leg swelling?: Yes Chest pain?: No  Respiratory Cough?: No Shortness of breath?: No  Endocrine Excessive thirst?: No  Musculoskeletal Back pain?: No Joint pain?: No  Neurological Headaches?: No Dizziness?: No  Psychologic Depression?: Yes Anxiety?: Yes  Physical Exam: BP 139/66   Pulse 72   Ht 5\' 6"  (1.676 m)   Wt 267 lb (121.1 kg)   BMI 43.09 kg/m   Constitutional: Well nourished. Alert and oriented, No acute distress. HEENT: [place] AT, moist mucus membranes. Trachea midline, no masses. Cardiovascular: No clubbing, cyanosis, or edema. Respiratory: Normal respiratory effort, no increased work of breathing. GI: Abdomen is soft, non tender, non distended, no abdominal masses. Liver and spleen not palpable.  No hernias appreciated.  Stool sample for occult testing is not indicated.   GU: No CVA tenderness.  No bladder fullness or masses.  Atrophic external genitalia, normal pubic hair distribution, no lesions.  Normal urethral meatus, no lesions, no prolapse, no discharge.   No urethral masses, tenderness and/or tenderness. No bladder fullness, tenderness or masses. Normal vagina mucosa, good estrogen effect, no discharge, no lesions, good pelvic support, no cystocele or rectocele noted.  No cervical motion tenderness.  Uterus is freely mobile and non-fixed.  No adnexal/parametria masses or tenderness noted.  Anus and perineum are without rashes or lesions.    Skin: No rashes, bruises or suspicious lesions. Lymph: No cervical or inguinal adenopathy. Neurologic: Grossly intact, no focal deficits, moving all 4 extremities. Psychiatric: Normal mood and affect.  Laboratory Data: Lab Results  Component Value Date    WBC 5.2 09/13/2015   HGB 11.7 (L) 09/13/2015   HCT 36.3 09/13/2015   MCV 86.8 09/13/2015   PLT 198 09/13/2015    Lab Results  Component Value Date   CREATININE 0.88 04/22/2016    Lab Results  Component Value Date   HGBA1C 6.3 08/19/2012    Lab Results  Component Value Date   TSH 1.20 01/06/2014    Lab Results  Component Value Date   AST 15 09/12/2015   Lab Results  Component Value Date   ALT 11 (L) 09/12/2015    Urinalysis Significant for 0-5 RBC's.  See EPIC.    Pertinent Imaging: CLINICAL DATA:  Acute onset of generalized abdominal pain and nausea. Shortness of breath. Initial encounter.  EXAM: CT ABDOMEN AND PELVIS WITH CONTRAST  TECHNIQUE: Multidetector CT imaging of the abdomen and pelvis was performed using the standard protocol following bolus administration of intravenous contrast.  CONTRAST:  177mL OMNIPAQUE IOHEXOL 300 MG/ML  SOLN  COMPARISON:  CT of the abdomen and pelvis performed 04/25/2015  FINDINGS: Mild bibasilar atelectasis is noted. Trace pericardial fluid remains within normal limits.  The liver and spleen are unremarkable in appearance. The gallbladder is within normal limits.  The pancreas and adrenal glands are unremarkable.  The kidneys are unremarkable in appearance. There is no evidence of hydronephrosis. No renal or ureteral stones are seen. No perinephric stranding is appreciated.  No free fluid is identified. The small bowel is unremarkable in appearance. The stomach is within normal limits. No acute vascular abnormalities are seen.  The appendix is normal in caliber, without evidence of appendicitis. Diffuse diverticulosis is noted along the entirety of the colon, without evidence of diverticulitis.  The bladder is moderately distended and grossly unremarkable. The uterus is mildly enlarged, with multiple fibroids, some of which demonstrate calcification. The ovaries are grossly symmetric and unremarkable  in appearance. No inguinal lymphadenopathy is seen.  No acute osseous abnormalities are identified.  IMPRESSION: 1. No acute abnormality seen within the abdomen or pelvis. 2. Diffuse diverticulosis along the entirety of the colon, without evidence of diverticulitis. 3. Mildly enlarged uterus, with multiple fibroids, some of which demonstrate calcification. 4. Mild bibasilar atelectasis noted.   Electronically Signed   By: Garald Balding M.D.   On: 09/12/2015 05:38   Assessment & Plan:   1. Microscopic hematuria  -  I explained to the patient that there are a number of causes that can be associated with blood in the urine, such as stones,  UTI's, damage to the urinary tract and/or cancer.  - Patient has had two CT's with contrast within the last year that did not demonstrate any GU pathology.    - I did have a discussion with the patient and her sister regarding the issue of possibly missing some urological tumors due to her not undergoing a CT urogram.  She is not a smoker, so she is not a high risk for GU malignancies.  We have decided to forego a CT urogram at this time unless she develops gross hematuria or a malignancy is found in her bladder during the cystoscopy.    - I did discuss with the patient and her sister the cystoscopy is typically recommended to complete a hematuria work up.   I described how this is performed, typically in an office setting with a flexible cystoscope. We described the risks, benefits, and possible side effects, the most common of which is a minor amount of blood in the urine and/or burning which usually resolves in 24 to 48 hours.  Patient tolerated her pelvic exam without issue.  She and her sister would like to pursue a cystoscopy at this time.  - The patient had the opportunity to ask questions which were answered. Based upon this discussion, the patient is willing to proceed. Therefore, I've ordered cystoscopy.  - She will return following all of  the above for discussion of the results.     - Creatinine, serum  - Urinalysis complete, with microscopic (For [facility] ONLY)  - Urine culture  - BUN  - BLADDER SCAN AMB NON-IMAGING  2. Nocturia  - reinforced with the patient to sleep with her CPAP machine to decrease her night time voiding and leakage   Return for schedule cystoscopy.  These notes generated with voice recognition software. I apologize for typographical errors.  Zara Council, [facility] [address] [place], [place] [address] [phone]

## 2016-04-23 LAB — URINE CULTURE: Culture: 40000 — AB

## 2016-04-25 ENCOUNTER — Telehealth: Payer: Self-pay

## 2016-04-25 DIAGNOSIS — N390 Urinary tract infection, site not specified: Secondary | ICD-10-CM

## 2016-04-25 DIAGNOSIS — G4733 Obstructive sleep apnea (adult) (pediatric): Secondary | ICD-10-CM | POA: Diagnosis not present

## 2016-04-25 DIAGNOSIS — R05 Cough: Secondary | ICD-10-CM | POA: Diagnosis not present

## 2016-04-25 MED ORDER — AMOXICILLIN 875 MG PO TABS: 875.0000 mg | ORAL_TABLET | Freq: Two times a day (BID) | ORAL | 0 refills | Status: DC

## 2016-04-25 NOTE — Telephone Encounter (Signed)
Spoke with pt in reference to +ucx. Made aware abx were sent to pharmacy. Pt voiced understanding.  

## 2016-04-25 NOTE — Telephone Encounter (Signed)
-----   Message from Nori Riis, PA-C sent at 04/24/2016  5:03 PM EDT ----- Patient has a +UCx.  They need to start Amoxicillin 875 mg,  one tablet twice daily for seven days.  They also need to take a probiotic with the antibiotic course.  The dosage is listed below:  L. acidophilus and L. casei (25 x 109 CFU/day for 2 days, then 50 x 109 CFU/day for duration of the antibiotic course)

## 2016-04-28 ENCOUNTER — Other Ambulatory Visit: Payer: Self-pay

## 2016-04-28 DIAGNOSIS — R3129 Other microscopic hematuria: Secondary | ICD-10-CM

## 2016-04-29 ENCOUNTER — Other Ambulatory Visit
Admission: RE | Admit: 2016-04-29 | Discharge: 2016-04-29 | Disposition: A | Discharge status: Home / Self Care | Payer: Medicare Other | Source: Ambulatory Visit | Attending: Urology | Admitting: Urology

## 2016-04-29 ENCOUNTER — Ambulatory Visit (INDEPENDENT_AMBULATORY_CARE_PROVIDER_SITE_OTHER): Payer: Medicare Other | Admitting: Urology

## 2016-04-29 VITALS — BP 150/71 | HR 70 | Ht 64.0 in | Wt 280.0 lb

## 2016-04-29 DIAGNOSIS — R3129 Other microscopic hematuria: Secondary | ICD-10-CM

## 2016-04-29 LAB — URINALYSIS COMPLETE WITH MICROSCOPIC (ARMC ONLY)
Bacteria, UA: NONE SEEN
Bilirubin Urine: NEGATIVE
Glucose, UA: NEGATIVE mg/dL
Ketones, ur: NEGATIVE mg/dL
Leukocytes, UA: NEGATIVE
Nitrite: NEGATIVE
Protein, ur: NEGATIVE mg/dL
Specific Gravity, Urine: 1.02 (ref 1.005–1.030)
WBC, UA: NONE SEEN WBC/hpf (ref 0–5)
pH: 5.5 (ref 5.0–8.0)

## 2016-04-29 MED ORDER — CIPROFLOXACIN HCL 500 MG PO TABS
500.0000 mg | ORAL_TABLET | Freq: Once | ORAL | Status: AC
  Administered 2016-04-29: 500 mg via ORAL

## 2016-04-29 MED ORDER — LIDOCAINE HCL 2 % EX GEL
1.0000 "application " | Freq: Once | CUTANEOUS | Status: AC
  Administered 2016-04-29: 1 via URETHRAL

## 2016-04-29 NOTE — Progress Notes (Signed)
   04/29/16  CC:  Chief Complaint  Patient presents with  . Cysto    microscopic hematuria    HPI: 70 year old female who presents today for cystoscopy to complete her microscopic hematuria workup. She's had several contrast CT scans without delayed imaging over the past year which showed no obvious GU pathology.  No history of gross hematuria or environmental exposures. She is a nonsmoker.  She was treated for urinary tract infection earlier this month but this is likely a skin contaminant and not a true infection. Her associated UA was negative.  Blood pressure (!) 150/71, pulse 70, height 5\' 4"  (1.626 m), weight 280 lb (127 kg). NED. A&Ox3.   No respiratory distress   Abd soft, NT, ND Normal external genitalia with patent urethral meatus  Cystoscopy Procedure Note  Patient identification was confirmed, informed consent was obtained, and patient was prepped using Betadine solution.  Lidocaine jelly was administered per urethral meatus.    Preoperative abx where received prior to procedure.    Procedure: - Flexible cystoscope introduced, without any difficulty.   - Thorough search of the bladder revealed:    normal urethral meatus    normal urothelium    no stones    no ulcers     no tumors    no urethral polyps    mild trabeculation with a small saccule on right lateral wall    Mass effect posteriorly presumably due to external compression from possibly a stool ball  - Ureteral orifices were normal in position and appearance.  Post-Procedure: - Patient tolerated the procedure well  Assessment/ Plan:  1. Microscopic hematuria Status post negative endoscopic hematuria workup including negative CT scan as well as cystoscopy today Imaging of renal pelvis and ureters deferred today (delayed imaging) given fairly low risk for upper tract urothelial carcinoma and multiple CT scans over the past year Findings were discussed with the patient and her healthcare proxy in  detail Recommend bowel regimen given the likelihood of constipation   - ciprofloxacin (CIPRO) tablet 500 mg; Take 1 tablet (500 mg total) by mouth once. - lidocaine (XYLOCAINE) 2 % jelly 1 application; Place 1 application into the urethra once.  Return if symptoms worsen or fail to improve.   Hollice Espy, MD

## 2016-05-03 DIAGNOSIS — G4733 Obstructive sleep apnea (adult) (pediatric): Secondary | ICD-10-CM | POA: Diagnosis not present

## 2016-06-02 ENCOUNTER — Encounter: Payer: Self-pay | Admitting: Internal Medicine

## 2016-06-02 ENCOUNTER — Ambulatory Visit (INDEPENDENT_AMBULATORY_CARE_PROVIDER_SITE_OTHER): Payer: Medicare Other | Admitting: Internal Medicine

## 2016-06-02 VITALS — BP 128/76 | HR 76 | Ht 64.0 in | Wt 280.0 lb

## 2016-06-02 DIAGNOSIS — R059 Cough, unspecified: Secondary | ICD-10-CM

## 2016-06-02 DIAGNOSIS — Z9989 Dependence on other enabling machines and devices: Secondary | ICD-10-CM | POA: Diagnosis not present

## 2016-06-02 DIAGNOSIS — G4733 Obstructive sleep apnea (adult) (pediatric): Secondary | ICD-10-CM | POA: Diagnosis not present

## 2016-06-02 DIAGNOSIS — Z0000 Encounter for general adult medical examination without abnormal findings: Secondary | ICD-10-CM | POA: Diagnosis not present

## 2016-06-02 DIAGNOSIS — R05 Cough: Secondary | ICD-10-CM | POA: Diagnosis not present

## 2016-06-02 DIAGNOSIS — Z1231 Encounter for screening mammogram for malignant neoplasm of breast: Secondary | ICD-10-CM

## 2016-06-02 DIAGNOSIS — Z1159 Encounter for screening for other viral diseases: Secondary | ICD-10-CM | POA: Diagnosis not present

## 2016-06-02 DIAGNOSIS — F411 Generalized anxiety disorder: Secondary | ICD-10-CM | POA: Diagnosis not present

## 2016-06-02 DIAGNOSIS — R600 Localized edema: Secondary | ICD-10-CM | POA: Diagnosis not present

## 2016-06-02 DIAGNOSIS — R0902 Hypoxemia: Secondary | ICD-10-CM | POA: Diagnosis not present

## 2016-06-02 HISTORY — DX: Cough, unspecified: R05.9

## 2016-06-02 NOTE — Patient Instructions (Addendum)
Health Maintenance  Topic Date Due  . TETANUS/TDAP  12/02/1965  . ZOSTAVAX  12/03/2006  . DEXA SCAN  12/03/2011  . MAMMOGRAM  06/28/2015  . COLONOSCOPY  04/21/2024  . INFLUENZA VACCINE  Completed  . Hepatitis C Screening  Addressed  . PNA vac Low Risk Adult  Completed

## 2016-06-02 NOTE — Progress Notes (Signed)
Patient: Taylor Johnson, Female    DOB: 1945/11/15, 70 y.o.   MRN: [id number] Visit Date: 06/02/2016  Today's Provider: Halina Maidens, MD   Chief Complaint  Patient presents with  . Medicare Wellness    refill meds   Subjective:    Annual wellness visit Taylor Johnson is a 70 y.o. female who presents today for her Subsequent Annual Wellness Visit. She feels fairly well. She reports exercising none. She reports she is sleeping fairly well. She uses her CPAP even though she does not like it.  ----------------------------------------------------------- Anxiety  Presents for follow-up visit. Symptoms include nervous/anxious behavior. Patient reports no chest pain, confusion, dizziness, insomnia, nausea, palpitations or shortness of breath. Symptoms occur most days. The severity of symptoms is mild.   Compliance with medications: sister is unsure what she is taking - med list shows lexapro but I have never prescribed it.  I have prescribed Remeron at hs but patient says she does not take any night time medication.  Cough  This is a recurrent problem. The problem has been gradually improving. The problem occurs every few hours. Pertinent negatives include no chest pain, chills, headaches, rash, sore throat, shortness of breath or wheezing. Treatments tried: seen by Pulmonary.  Edema - off and on that improves with elevation of the feet.  Reviewed her diet - she does eat salty meats like bacon or bologna.  She cooks but does not add salt.  Review of Systems  Constitutional: Negative for chills, diaphoresis and unexpected weight change.  HENT: Negative for facial swelling, sore throat and trouble swallowing.   Eyes: Negative for visual disturbance.  Respiratory: Positive for cough. Negative for shortness of breath and wheezing.   Cardiovascular: Positive for leg swelling. Negative for chest pain and palpitations.  Gastrointestinal: Negative for abdominal pain and nausea.  Genitourinary: Negative  for difficulty urinating and hematuria.  Musculoskeletal: Negative for arthralgias.  Skin: Negative for rash.  Neurological: Negative for dizziness and headaches.  Psychiatric/Behavioral: Negative for confusion. The patient is nervous/anxious. The patient does not have insomnia.     Social History   Social History  . Marital status: Single    Spouse name: N/A  . Number of children: N/A  . Years of education: N/A   Occupational History  . Not on file.   Social History Main Topics  . Smoking status: Never Smoker  . Smokeless tobacco: Never Used  . Alcohol use No  . Drug use: No  . Sexual activity: No   Other Topics Concern  . Not on file   Social History Narrative  . No narrative on file    Patient Active Problem List   Diagnosis Date Noted  . Cough 06/02/2016  . Localized edema 04/15/2016  . Microscopic hematuria 04/15/2016  . OSA on CPAP 11/18/2015  . Dysphagia 10/15/2015  . Hypoxia 09/12/2015  . Urge incontinence of urine 05/12/2015  . Diverticulosis of colon 05/11/2015  . Anxiety, generalized 05/11/2015    Past Surgical History:  Procedure Laterality Date  . ABDOMINAL HYSTERECTOMY     bleeding    Her family history includes Hypertension in her mother; Kidney disease in her mother.     Previous Medications   ESCITALOPRAM (LEXAPRO) 10 MG TABLET    Take 10 mg by mouth daily.   FLUTICASONE (FLONASE) 50 MCG/ACT NASAL SPRAY    Place into the nose.   MIRTAZAPINE (REMERON SOL-TAB) 15 MG DISINTEGRATING TABLET    Take 1 tablet (15 mg total) by mouth at bedtime.  OMEPRAZOLE (PRILOSEC) 20 MG CAPSULE    Take 20 mg by mouth daily.    Patient Care Team: [name], MD as PCP - General (Family Medicine) Erby Pian, MD as Referring Physician (Pulmonary Disease)      Objective:   Vitals: BP (!) 148/78   Pulse 76   Ht 5\' 4"  (1.626 m)   Wt 280 lb (127 kg)   SpO2 96%   BMI 48.06 kg/m   Physical Exam  Constitutional: She is oriented to person,  place, and time. She appears well-developed and well-nourished. No distress.  HENT:  Head: Normocephalic and atraumatic.  Right Ear: Tympanic membrane and ear canal normal.  Left Ear: Tympanic membrane and ear canal normal.  Nose: Right sinus exhibits no maxillary sinus tenderness. Left sinus exhibits no maxillary sinus tenderness.  Mouth/Throat: Uvula is midline and oropharynx is clear and moist.  Eyes: Conjunctivae and EOM are normal. Right eye exhibits no discharge. Left eye exhibits no discharge. No scleral icterus.  Neck: Normal range of motion. Carotid bruit is not present. No erythema present. No thyromegaly present.  Cardiovascular: Normal rate, regular rhythm, normal heart sounds and normal pulses.   Pulmonary/Chest: Effort normal. No respiratory distress. She has no wheezes. Right breast exhibits no mass, no nipple discharge, no skin change and no tenderness. Left breast exhibits no mass, no nipple discharge, no skin change and no tenderness.  Abdominal: Soft. Bowel sounds are normal. There is no hepatosplenomegaly. There is no tenderness. There is no CVA tenderness.  Musculoskeletal: She exhibits edema. She exhibits no tenderness.  Lymphadenopathy:    She has no cervical adenopathy.    She has no axillary adenopathy.  Neurological: She is alert and oriented to person, place, and time. She has normal reflexes. No cranial nerve deficit or sensory deficit.  Skin: Skin is warm, dry and intact. No rash noted.  Psychiatric: Her affect is blunt. She is slowed. Cognition and memory are impaired.  Speech is sparse but appropriate  Nursing note and vitals reviewed.   Activities of Daily Living In your present state of health, do you have any difficulty performing the following activities: 06/02/2016 09/12/2015  Hearing? N N  Vision? N N  Difficulty concentrating or making decisions? Y N  Walking or climbing stairs? Y N  Dressing or bathing? Y N  Doing errands, shopping? Tempie Donning  Preparing  Food and eating ? N -  Using the Toilet? N -  In the past six months, have you accidently leaked urine? N -  Do you have problems with loss of bowel control? N -  Managing your Medications? N -  Managing your Finances? Y -  Housekeeping or managing your Housekeeping? N -  Some recent data might be hidden    Fall Risk Assessment Fall Risk  06/02/2016 05/12/2015  Falls in the past year? No No      Depression Screen PHQ 2/9 Scores 06/02/2016 05/12/2015  PHQ - 2 Score 0 2  PHQ- 9 Score - 4    6CIT Screen 06/02/2016  What Year? 4 points  What month? 0 points  What time? 0 points  Count back from 20 4 points  Months in reverse 4 points  Repeat phrase 4 points  Total Score 16     Medicare Annual Wellness Visit Summary:  Reviewed patient's Family Medical History Reviewed and updated list of patient's medical providers Assessment of cognitive impairment was done Assessed patient's functional ability Established a written schedule for health screening  services Health Risk Assessent Completed and Reviewed  Exercise Activities and Dietary recommendations Goals    None      Immunization History  Administered Date(s) Administered  . Influenza,inj,Quad PF,36+ Mos 05/12/2015, 04/05/2016  . Pneumococcal Conjugate-13 04/05/2016  . Pneumococcal Polysaccharide-23 01/06/2014, 09/13/2015    Health Maintenance  Topic Date Due  . Hepatitis C Screening  08-28-1945  . TETANUS/TDAP  12/02/1965  . ZOSTAVAX  12/03/2006  . DEXA SCAN  12/03/2011  . MAMMOGRAM  06/28/2015  . COLONOSCOPY  04/21/2024  . INFLUENZA VACCINE  Completed  . PNA vac Low Risk Adult  Completed    Discussed health benefits of physical activity, and encouraged her to engage in regular exercise appropriate for her age and condition.    ------------------------------------------------------------------------------------------------------------  Assessment & Plan:     1. Medicare annual wellness visit,  subsequent Measures satisfied Rx for Zostavax given - Lipid panel  2. OSA on CPAP Encourage pt to use consistently  3. Anxiety, generalized Unsure of current medications - lexapro vs Remeron Sister will call pharmacy to clarify  4. Cough improved - CBC with Differential/Platelet - Comprehensive metabolic panel  5. Encounter for screening mammogram for breast cancer - MM DIGITAL SCREENING BILATERAL; Future  6. Need for hepatitis C screening test - Hepatitis C antibody    Halina Maidens, MD [facility]  06/02/2016

## 2016-06-03 ENCOUNTER — Other Ambulatory Visit: Payer: Self-pay

## 2016-06-03 LAB — COMPREHENSIVE METABOLIC PANEL
ALT: 8 IU/L (ref 0–32)
AST: 11 IU/L (ref 0–40)
Albumin/Globulin Ratio: 1.1 — ABNORMAL LOW (ref 1.2–2.2)
Albumin: 3.8 g/dL (ref 3.6–4.8)
Alkaline Phosphatase: 92 IU/L (ref 39–117)
BUN/Creatinine Ratio: 21 (ref 12–28)
BUN: 18 mg/dL (ref 8–27)
Bilirubin Total: 0.3 mg/dL (ref 0.0–1.2)
CO2: 31 mmol/L — ABNORMAL HIGH (ref 18–29)
Calcium: 9.3 mg/dL (ref 8.7–10.3)
Chloride: 99 mmol/L (ref 96–106)
Creatinine, Ser: 0.85 mg/dL (ref 0.57–1.00)
GFR calc Af Amer: 81 mL/min/{1.73_m2} (ref >59)
GFR calc non Af Amer: 70 mL/min/{1.73_m2} (ref >59)
Globulin, Total: 3.5 g/dL (ref 1.5–4.5)
Glucose: 99 mg/dL (ref 65–99)
Potassium: 3.9 mmol/L (ref 3.5–5.2)
Sodium: 144 mmol/L (ref 134–144)
Total Protein: 7.3 g/dL (ref 6.0–8.5)

## 2016-06-03 LAB — CBC WITH DIFFERENTIAL/PLATELET
Basophils Absolute: 0 10*3/uL (ref 0.0–0.2)
Basos: 0 %
EOS (ABSOLUTE): 0.2 10*3/uL (ref 0.0–0.4)
Eos: 4 %
Hematocrit: 39 % (ref 34.0–46.6)
Hemoglobin: 12.2 g/dL (ref 11.1–15.9)
Immature Grans (Abs): 0 10*3/uL (ref 0.0–0.1)
Immature Granulocytes: 0 %
Lymphocytes Absolute: 2.2 10*3/uL (ref 0.7–3.1)
Lymphs: 36 %
MCH: 28.2 pg (ref 26.6–33.0)
MCHC: 31.3 g/dL — ABNORMAL LOW (ref 31.5–35.7)
MCV: 90 fL (ref 79–97)
Monocytes Absolute: 0.4 10*3/uL (ref 0.1–0.9)
Monocytes: 6 %
Neutrophils Absolute: 3.1 10*3/uL (ref 1.4–7.0)
Neutrophils: 54 %
Platelets: 246 10*3/uL (ref 150–379)
RBC: 4.32 x10E6/uL (ref 3.77–5.28)
RDW: 15.5 % — ABNORMAL HIGH (ref 12.3–15.4)
WBC: 5.9 10*3/uL (ref 3.4–10.8)

## 2016-06-03 LAB — LIPID PANEL
Chol/HDL Ratio: 2.6 ratio units (ref 0.0–4.4)
Cholesterol, Total: 157 mg/dL (ref 100–199)
HDL: 61 mg/dL (ref >39)
LDL Calculated: 80 mg/dL (ref 0–99)
Triglycerides: 79 mg/dL (ref 0–149)
VLDL Cholesterol Cal: 16 mg/dL (ref 5–40)

## 2016-06-03 LAB — HEPATITIS C ANTIBODY: Hep C Virus Ab: <0.1 s/co ratio (ref 0.0–0.9)

## 2016-06-03 LAB — TSH: TSH: 0.787 u[IU]/mL (ref 0.450–4.500)

## 2016-06-03 MED ORDER — MIRTAZAPINE 15 MG PO TABS: 15.0000 mg | ORAL_TABLET | Freq: Every day | ORAL | 5 refills | Status: DC

## 2016-06-20 ENCOUNTER — Telehealth: Payer: Self-pay

## 2016-06-20 NOTE — Telephone Encounter (Signed)
[name] called to report patient cussing and arguing and being mean to mother and very angry.Wants sedative or anxiety Rx. Advised Psych and she said they can not control her and she will not go to Dr office.   Advised we do not give these meds nor do we specialize in any behavior medicine. Advised if patient is a threat to herself or her family then need to call 911 to have them help.   I will look into soonest Psych appointment and call her back but she is aware 911 is best result until then.

## 2016-09-02 ENCOUNTER — Telehealth: Payer: Self-pay

## 2016-09-02 ENCOUNTER — Other Ambulatory Visit: Payer: Self-pay | Admitting: Internal Medicine

## 2016-09-02 DIAGNOSIS — F411 Generalized anxiety disorder: Secondary | ICD-10-CM

## 2016-09-02 NOTE — Telephone Encounter (Signed)
Taylor Johnson talked to pt sister [facility]) about referral to Psychiatrist.

## 2016-09-02 NOTE — Telephone Encounter (Signed)
Referral sent to [facility].  They should call [name].

## 2016-09-02 NOTE — Telephone Encounter (Signed)
Pt need referral to Psychiatrist. They need to call [name] (her sister) for the appointment time. Her number is [phone].

## 2016-09-26 ENCOUNTER — Other Ambulatory Visit: Payer: Self-pay | Admitting: Internal Medicine

## 2016-09-26 DIAGNOSIS — F411 Generalized anxiety disorder: Secondary | ICD-10-CM

## 2016-10-07 ENCOUNTER — Emergency Department: Payer: Medicare Other

## 2016-10-07 ENCOUNTER — Emergency Department
Admission: EM | Admit: 2016-10-07 | Discharge: 2016-10-07 | Disposition: A | Discharge status: Home / Self Care | Payer: Medicare Other | Attending: Emergency Medicine | Admitting: Emergency Medicine

## 2016-10-07 ENCOUNTER — Encounter: Payer: Self-pay | Admitting: Intensive Care

## 2016-10-07 DIAGNOSIS — S4991XA Unspecified injury of right shoulder and upper arm, initial encounter: Secondary | ICD-10-CM | POA: Diagnosis not present

## 2016-10-07 DIAGNOSIS — R9431 Abnormal electrocardiogram [ECG] [EKG]: Secondary | ICD-10-CM | POA: Diagnosis not present

## 2016-10-07 DIAGNOSIS — B349 Viral infection, unspecified: Secondary | ICD-10-CM | POA: Insufficient documentation

## 2016-10-07 DIAGNOSIS — J45909 Unspecified asthma, uncomplicated: Secondary | ICD-10-CM | POA: Diagnosis not present

## 2016-10-07 DIAGNOSIS — M25511 Pain in right shoulder: Secondary | ICD-10-CM | POA: Diagnosis not present

## 2016-10-07 DIAGNOSIS — S299XXA Unspecified injury of thorax, initial encounter: Secondary | ICD-10-CM | POA: Diagnosis not present

## 2016-10-07 LAB — CBC WITH DIFFERENTIAL/PLATELET
Basophils Absolute: 0 10*3/uL (ref 0–0.1)
Basophils Relative: 1 %
Eosinophils Absolute: 0.1 10*3/uL (ref 0–0.7)
Eosinophils Relative: 1 %
HCT: 38.3 % (ref 35.0–47.0)
Hemoglobin: 12.3 g/dL (ref 12.0–16.0)
Lymphocytes Relative: 15 %
Lymphs Abs: 1.3 10*3/uL (ref 1.0–3.6)
MCH: 27.7 pg (ref 26.0–34.0)
MCHC: 32.1 g/dL (ref 32.0–36.0)
MCV: 86.1 fL (ref 80.0–100.0)
Monocytes Absolute: 0.6 10*3/uL (ref 0.2–0.9)
Monocytes Relative: 7 %
Neutro Abs: 6.3 10*3/uL (ref 1.4–6.5)
Neutrophils Relative %: 76 %
Platelets: 231 10*3/uL (ref 150–440)
RBC: 4.45 MIL/uL (ref 3.80–5.20)
RDW: 16.3 % — ABNORMAL HIGH (ref 11.5–14.5)
WBC: 8.3 10*3/uL (ref 3.6–11.0)

## 2016-10-07 LAB — URINALYSIS, COMPLETE (UACMP) WITH MICROSCOPIC
Bacteria, UA: NONE SEEN
Bilirubin Urine: NEGATIVE
Glucose, UA: NEGATIVE mg/dL
Ketones, ur: NEGATIVE mg/dL
Leukocytes, UA: NEGATIVE
Nitrite: NEGATIVE
Protein, ur: NEGATIVE mg/dL
Specific Gravity, Urine: 1.018 (ref 1.005–1.030)
pH: 5 (ref 5.0–8.0)

## 2016-10-07 LAB — TROPONIN I: Troponin I: <0.03 ng/mL (ref <0.03)

## 2016-10-07 LAB — BASIC METABOLIC PANEL
Anion gap: 7 (ref 5–15)
BUN: 17 mg/dL (ref 6–20)
CO2: 29 mmol/L (ref 22–32)
Calcium: 9.1 mg/dL (ref 8.9–10.3)
Chloride: 99 mmol/L — ABNORMAL LOW (ref 101–111)
Creatinine, Ser: 0.71 mg/dL (ref 0.44–1.00)
GFR calc Af Amer: >60 mL/min (ref >60)
GFR calc non Af Amer: >60 mL/min (ref >60)
Glucose, Bld: 125 mg/dL — ABNORMAL HIGH (ref 65–99)
Potassium: 4 mmol/L (ref 3.5–5.1)
Sodium: 135 mmol/L (ref 135–145)

## 2016-10-07 LAB — MAGNESIUM: Magnesium: 1.9 mg/dL (ref 1.7–2.4)

## 2016-10-07 MED ORDER — ONDANSETRON 4 MG PO TBDP
ORAL_TABLET | ORAL | Status: AC
  Administered 2016-10-07: 4 mg via ORAL
  Filled 2016-10-07: qty 1

## 2016-10-07 MED ORDER — ONDANSETRON HCL 4 MG PO TABS: 4.0000 mg | ORAL_TABLET | Freq: Three times a day (TID) | ORAL | 0 refills | Status: DC | PRN

## 2016-10-07 MED ORDER — ONDANSETRON 4 MG PO TBDP
4.0000 mg | ORAL_TABLET | Freq: Once | ORAL | Status: AC
  Administered 2016-10-07: 4 mg via ORAL

## 2016-10-07 MED ORDER — ACETAMINOPHEN 500 MG PO TABS
1000.0000 mg | ORAL_TABLET | Freq: Once | ORAL | Status: AC
  Administered 2016-10-07: 1000 mg via ORAL
  Filled 2016-10-07: qty 2

## 2016-10-07 NOTE — ED Triage Notes (Addendum)
Patient was brought in by EMS for pain in R shoulder that started this AM off and on. While in EMS truck patient became very nauseas. Sister states "patient has problems with tasks like counting money, writing, or expressing feelings. She also can not distinguish a pain scale. She has never seen anyone for this or been diagnosed with anything. This is how she has been her whole life" Sister states pt is at her baseline now. She is oriented to her self and can tell you her month and date of birthday only. Sister reports patient was prescribed bi-pap at home for nights but she refused to use and sent it back

## 2016-10-07 NOTE — ED Notes (Signed)
Pt placed on monitor, temp taken and EKG performed.

## 2016-10-07 NOTE — Discharge Instructions (Signed)
For your shoulder pain take 1000 mg of Tylenol 3 times a day. Apply heat to your shoulder. For your nausea and diarrhea take Zofran as needed for nausea. Make sure you keep yourself hydrated. Follow up with your primary care doctor. Return to the emergency room if you have concerns for dehydration, fever, abdominal pain, chest pain or shortness of breath.

## 2016-10-07 NOTE — ED Provider Notes (Signed)
[facility] Emergency Department Provider Note  ____________________________________________  Time seen: Approximately 1:45 PM  I have reviewed the triage vital signs and the nursing notes.   HISTORY  Chief Complaint Shoulder Pain   HPI Taylor Johnson is a 71 y.o. female with a history of developmental delay, obstructive sleep apnea (not on CPAP), and anxiety who presents via EMS for evaluation of right shoulder pain. Patient tells me that she had a fall from the sink 4 days ago and since then has had right shoulder pain. The pain got progressively worse today which prompted her to call EMS. She reports that the pain is present when she moves her shoulder, she points to the anterior region of her shoulder, tells me that at rest she has no pain and with movement the pain is moderate in intensity, non radiating. She denies any other trauma from her fall 4 days ago. She hasn't tried anything at home for the pain. She also reports that today she has had several watery bowel movements and nausea. No vomiting, no fever, no abdominal pain, no chest pain or shortness of breath. She endorses a dry cough since yesterday and chills.  Past Medical History:  Diagnosis Date  . Anxiety   . Asthma   . Depression   . Developmental delay   . Diverticulitis   . Leg swelling     Patient Active Problem List   Diagnosis Date Noted  . Cough 06/02/2016  . Localized edema 04/15/2016  . Microscopic hematuria 04/15/2016  . OSA on CPAP 11/18/2015  . Dysphagia 10/15/2015  . Hypoxia 09/12/2015  . Urge incontinence of urine 05/12/2015  . Diverticulosis of colon 05/11/2015  . Anxiety, generalized 05/11/2015    Past Surgical History:  Procedure Laterality Date  . ABDOMINAL HYSTERECTOMY     bleeding    Prior to Admission medications   Medication Sig Start Date End Date Taking? Authorizing Provider  fluticasone (FLONASE) 50 MCG/ACT nasal spray Place into the nose. 02/15/16 02/14/17   Historical Provider, MD  mirtazapine (REMERON) 15 MG tablet Take 1 tablet (15 mg total) by mouth at bedtime. 06/03/16   [name], MD  omeprazole (PRILOSEC) 20 MG capsule Take 20 mg by mouth daily.    Historical Provider, MD  ondansetron (ZOFRAN) 4 MG tablet Take 1 tablet (4 mg total) by mouth every 8 (eight) hours as needed for nausea or vomiting. 10/07/16   Rudene Re, MD    Allergies Patient has no known allergies.  Family History  Problem Relation Age of Onset  . Hypertension Mother   . Kidney disease Mother   . Bladder Cancer Neg Hx     Social History Social History  Substance Use Topics  . Smoking status: Never Smoker  . Smokeless tobacco: Never Used  . Alcohol use No    Review of Systems  Constitutional: Negative for fever. Eyes: Negative for visual changes. ENT: Negative for sore throat. Neck: No neck pain  Cardiovascular: Negative for chest pain. Respiratory: Negative for shortness of breath. + cough Gastrointestinal: Negative for abdominal pain, vomiting. + nausea and diarrhea. Genitourinary: Negative for dysuria. Musculoskeletal: Negative for back pain. + R shoulder pain Skin: Negative for rash. Neurological: Negative for headaches, weakness or numbness. Psych: No SI or HI  ____________________________________________   PHYSICAL EXAM:  VITAL SIGNS: ED Triage Vitals  Enc Vitals Group     BP 10/07/16 1318 (!) 153/71     Pulse Rate 10/07/16 1318 68  Resp 10/07/16 1318 17     Temp 10/07/16 1318 97.6 F (36.4 C)     Temp Source 10/07/16 1318 Oral     SpO2 10/07/16 1318 92 %     Weight 10/07/16 1319 280 lb (127 kg)     Height 10/07/16 1319 5\' 5"  (1.651 m)     Head Circumference --      Peak Flow --      Pain Score --      Pain Loc --      Pain Edu? --      Excl. in [place]? --     Constitutional: Alert and oriented. Well appearing and in no apparent distress. HEENT:      Head: Normocephalic and atraumatic.         Eyes: Conjunctivae  are normal. Sclera is non-icteric. EOMI. PERRL      Mouth/Throat: Mucous membranes are moist.       Neck: Supple with no signs of meningismus. Cardiovascular: Regular rate and rhythm. No murmurs, gallops, or rubs. 2+ symmetrical distal pulses are present in all extremities. No JVD. Respiratory: Normal respiratory effort. Lungs are clear to auscultation bilaterally. No wheezes, crackles, or rhonchi.  Gastrointestinal: Soft, non tender, and non distended with positive bowel sounds. No rebound or guarding. Genitourinary: No CVA tenderness. Musculoskeletal: Patient is tender to palpation the anterior aspect of her right shoulder with limited mostly preserved range of motion due to pain, no swelling, erythema, or warmth. Nontender with normal range of motion in all other extremities. No edema, cyanosis, or erythema of extremities. Neurologic: Normal speech and language. Face is symmetric. Moving all extremities. No gross focal neurologic deficits are appreciated. Skin: Skin is warm, dry and intact. No rash noted. Psychiatric: Mood and affect are normal. Speech and behavior are normal.  ____________________________________________   LABS (all labs ordered are listed, but only abnormal results are displayed)  Labs Reviewed  CBC WITH DIFFERENTIAL/PLATELET - Abnormal; Notable for the following:       Result Value   RDW 16.3 (*)    All other components within normal limits  BASIC METABOLIC PANEL - Abnormal; Notable for the following:    Chloride 99 (*)    Glucose, Bld 125 (*)    All other components within normal limits  URINALYSIS, COMPLETE (UACMP) WITH MICROSCOPIC - Abnormal; Notable for the following:    Color, Urine YELLOW (*)    APPearance CLEAR (*)    Hgb urine dipstick MODERATE (*)    Squamous Epithelial / LPF 0-5 (*)    All other components within normal limits  TROPONIN I  MAGNESIUM   ____________________________________________  EKG  ED ECG REPORT I, Rudene Re, the  attending physician, personally viewed and interpreted this ECG.  Normal sinus rhythm, rate of 65, normal intervals, normal axis, T-wave inversions in lateral leads, no ST elevation. Unchanged from prior. ____________________________________________  RADIOLOGY  CXR: Stable cardiomegaly. No acute findings  XR R shoulder:  1. No acute osseous abnormality 2. AC joint and glenohumeral degenerative changes. Prominent bony spurring along the inferior surface of the acromion. ____________________________________________   PROCEDURES  Procedure(s) performed: None Procedures Critical Care performed:  None ____________________________________________   INITIAL IMPRESSION / ASSESSMENT AND PLAN / ED COURSE  71 y.o. female with a history of developmental delay, obstructive sleep apnea (not on CPAP), and anxiety who presents via EMS for evaluation of multiple medical complaints.  # R shoulder pain: patient had a fall 4 days ago and has had shoulder pain  ever since which is worse with palpation and movement. X-rays showing degenerative changes with prominent bony spurring in the inferior surface of the acromium which coincides with poor patient is tender. She has limited but mostly intact range of motion with no erythema or swelling with no clinical suspicion for septic joint  # N/D/cough: Consistent with a viral process. Chest x-ray with no evidence of pneumonia. Patient is afebrile. Abdominal exam with no tenderness throughout. Blood work showing a normal creatinine, normal electrolytes, and normal white count. UA with no evidence of urinary tract infection. Patient was given Zofran. We'll by mouth challenge and discharged home.    Pertinent labs & imaging results that were available during my care of the patient were reviewed by me and considered in my medical decision making (see chart for details).    ____________________________________________   FINAL CLINICAL IMPRESSION(S) / ED  DIAGNOSES  Final diagnoses:  Acute pain of right shoulder  Viral illness      NEW MEDICATIONS STARTED DURING THIS VISIT:  New Prescriptions   ONDANSETRON (ZOFRAN) 4 MG TABLET    Take 1 tablet (4 mg total) by mouth every 8 (eight) hours as needed for nausea or vomiting.     Note:  This document was prepared using Dragon voice recognition software and may include unintentional dictation errors.    Rudene Re, MD 10/07/16 1537

## 2016-10-10 ENCOUNTER — Ambulatory Visit (INDEPENDENT_AMBULATORY_CARE_PROVIDER_SITE_OTHER): Payer: Medicare Other | Admitting: Internal Medicine

## 2016-10-10 ENCOUNTER — Encounter: Payer: Self-pay | Admitting: Internal Medicine

## 2016-10-10 VITALS — BP 138/68 | HR 80 | Ht 64.0 in | Wt 271.0 lb

## 2016-10-10 DIAGNOSIS — M19011 Primary osteoarthritis, right shoulder: Secondary | ICD-10-CM | POA: Diagnosis not present

## 2016-10-10 DIAGNOSIS — F411 Generalized anxiety disorder: Secondary | ICD-10-CM

## 2016-10-10 DIAGNOSIS — L089 Local infection of the skin and subcutaneous tissue, unspecified: Secondary | ICD-10-CM

## 2016-10-10 MED ORDER — NAPROXEN 500 MG PO TABS: 500.0000 mg | ORAL_TABLET | Freq: Two times a day (BID) | ORAL | 0 refills | Status: DC

## 2016-10-10 NOTE — Progress Notes (Signed)
Date:  10/10/2016   Name:  Taylor Johnson   DOB:  26-Mar-1946   MRN:  [id number]   Chief Complaint: Shoulder Pain (Rt shoulder pain after lifting 2 buckets of water. Gets worse when laying down .) and Hand Pain (Ring finger on rt hand has bump that popped up.) Shoulder Pain   The pain is present in the right shoulder and left shoulder. This is a new problem. The current episode started in the past 7 days. The problem occurs constantly. The problem has been unchanged. The quality of the pain is described as aching. Pertinent negatives include no fever.  Hand Pain   There was no injury mechanism. The pain is present in the right fingers. The quality of the pain is described as burning. Pertinent negatives include no chest pain.   She was seen in ER and has xrays.  No fracture of shoulder.  She has taken tylenol but it did not help.  She reports carrying two heavy buckets of water when the pain started.  Later she had a fall that prompted the ER visit. Xray: IMPRESSION: 1. No acute osseous abnormality 2. AC joint and glenohumeral degenerative changes. Prominent bony spurring along the inferior surface of the acromion.  Anxiety disorder - she has an appointment with Psych next week.  Per her sister, she has excessive anger toward law enforcement and toward family members at times.  Ms. Cressler can not express her real concerns.  She is reluctant today to talk about it. Review of Systems  Constitutional: Negative for chills and fever.  Respiratory: Negative for chest tightness, shortness of breath and wheezing.   Cardiovascular: Negative for chest pain, palpitations and leg swelling.  Musculoskeletal: Positive for arthralgias.    Patient Active Problem List   Diagnosis Date Noted  . Cough 06/02/2016  . Localized edema 04/15/2016  . Microscopic hematuria 04/15/2016  . OSA on CPAP 11/18/2015  . Dysphagia 10/15/2015  . Hypoxia 09/12/2015  . Urge incontinence of urine 05/12/2015  .  Diverticulosis of colon 05/11/2015  . Anxiety, generalized 05/11/2015    Prior to Admission medications   Medication Sig Start Date End Date Taking? Authorizing Provider  ondansetron (ZOFRAN) 4 MG tablet Take 4 mg by mouth every 8 (eight) hours as needed for nausea or vomiting.   Yes Historical Provider, MD    No Known Allergies  Past Surgical History:  Procedure Laterality Date  . ABDOMINAL HYSTERECTOMY     bleeding    Social History  Substance Use Topics  . Smoking status: Never Smoker  . Smokeless tobacco: Never Used  . Alcohol use No     Medication list has been reviewed and updated.   Physical Exam  Constitutional: She is oriented to person, place, and time. She appears well-developed. No distress.  HENT:  Head: Normocephalic and atraumatic.  Neck: Normal range of motion.  Cardiovascular: Normal rate, regular rhythm and normal heart sounds.   Pulmonary/Chest: Effort normal and breath sounds normal. No respiratory distress. She has no wheezes.  Musculoskeletal: Normal range of motion.       Right shoulder: She exhibits tenderness (minimal). She exhibits normal range of motion, no swelling and normal strength.       Left shoulder: Normal.  Neurological: She is alert and oriented to person, place, and time.  Skin: Skin is warm and dry. No rash noted.  Tiny 1 mm Pustule on right forth finger, finger tender and slightly swollen  Psychiatric: She has a normal mood  and affect. Her behavior is normal. Thought content normal.  Nursing note and vitals reviewed.   BP 138/68 (BP Location: Left Arm, Patient Position: Sitting, Cuff Size: Large)   Pulse 80   Ht 5\' 4"  (1.626 m)   Wt 271 lb (122.9 kg)   BMI 46.52 kg/m   Assessment and Plan: 1. Arthritis of right shoulder region - naproxen (NAPROSYN) 500 MG tablet; Take 1 tablet (500 mg total) by mouth 2 (two) times daily with a meal.  Dispense: 30 tablet; Refill: 0  2. Skin pustule Local care - follow up if  worsening  3. Anxiety, generalized Agree with Psych evaluation   Meds ordered this encounter  Medications  . naproxen (NAPROSYN) 500 MG tablet    Sig: Take 1 tablet (500 mg total) by mouth 2 (two) times daily with a meal.    Dispense:  30 tablet    Refill:  0    Halina Maidens, MD [facility]  10/11/2016

## 2016-12-11 ENCOUNTER — Emergency Department: Payer: Medicare Other

## 2016-12-11 ENCOUNTER — Emergency Department
Admission: EM | Admit: 2016-12-11 | Discharge: 2016-12-11 | Disposition: A | Discharge status: Home / Self Care | Payer: Medicare Other | Attending: Emergency Medicine | Admitting: Emergency Medicine

## 2016-12-11 ENCOUNTER — Encounter: Payer: Self-pay | Admitting: Emergency Medicine

## 2016-12-11 DIAGNOSIS — N3001 Acute cystitis with hematuria: Secondary | ICD-10-CM | POA: Diagnosis not present

## 2016-12-11 DIAGNOSIS — R1110 Vomiting, unspecified: Secondary | ICD-10-CM | POA: Diagnosis not present

## 2016-12-11 DIAGNOSIS — J45909 Unspecified asthma, uncomplicated: Secondary | ICD-10-CM | POA: Diagnosis not present

## 2016-12-11 DIAGNOSIS — Z79899 Other long term (current) drug therapy: Secondary | ICD-10-CM | POA: Insufficient documentation

## 2016-12-11 DIAGNOSIS — R1033 Periumbilical pain: Secondary | ICD-10-CM

## 2016-12-11 DIAGNOSIS — R109 Unspecified abdominal pain: Secondary | ICD-10-CM | POA: Diagnosis not present

## 2016-12-11 LAB — COMPREHENSIVE METABOLIC PANEL
ALT: 12 U/L — ABNORMAL LOW (ref 14–54)
AST: 16 U/L (ref 15–41)
Albumin: 3.9 g/dL (ref 3.5–5.0)
Alkaline Phosphatase: 92 U/L (ref 38–126)
Anion gap: 7 (ref 5–15)
BUN: 12 mg/dL (ref 6–20)
CO2: 32 mmol/L (ref 22–32)
Calcium: 9.4 mg/dL (ref 8.9–10.3)
Chloride: 99 mmol/L — ABNORMAL LOW (ref 101–111)
Creatinine, Ser: 0.91 mg/dL (ref 0.44–1.00)
GFR calc Af Amer: >60 mL/min (ref >60)
GFR calc non Af Amer: >60 mL/min (ref >60)
Glucose, Bld: 106 mg/dL — ABNORMAL HIGH (ref 65–99)
Potassium: 3.3 mmol/L — ABNORMAL LOW (ref 3.5–5.1)
Sodium: 138 mmol/L (ref 135–145)
Total Bilirubin: 0.6 mg/dL (ref 0.3–1.2)
Total Protein: 7.9 g/dL (ref 6.5–8.1)

## 2016-12-11 LAB — URINALYSIS, COMPLETE (UACMP) WITH MICROSCOPIC
Bilirubin Urine: NEGATIVE
Glucose, UA: NEGATIVE mg/dL
Ketones, ur: 20 mg/dL — AB
Leukocytes, UA: NEGATIVE
Nitrite: NEGATIVE
Protein, ur: NEGATIVE mg/dL
Specific Gravity, Urine: 1.013 (ref 1.005–1.030)
pH: 6 (ref 5.0–8.0)

## 2016-12-11 LAB — CBC
HCT: 38.8 % (ref 35.0–47.0)
Hemoglobin: 12.8 g/dL (ref 12.0–16.0)
MCH: 27.9 pg (ref 26.0–34.0)
MCHC: 32.9 g/dL (ref 32.0–36.0)
MCV: 84.9 fL (ref 80.0–100.0)
Platelets: 262 10*3/uL (ref 150–440)
RBC: 4.57 MIL/uL (ref 3.80–5.20)
RDW: 15.6 % — ABNORMAL HIGH (ref 11.5–14.5)
WBC: 6.6 10*3/uL (ref 3.6–11.0)

## 2016-12-11 LAB — LIPASE, BLOOD: Lipase: 15 U/L (ref 11–51)

## 2016-12-11 MED ORDER — CEPHALEXIN 500 MG PO CAPS
500.0000 mg | ORAL_CAPSULE | Freq: Once | ORAL | Status: AC
  Administered 2016-12-11: 500 mg via ORAL
  Filled 2016-12-11: qty 1

## 2016-12-11 MED ORDER — ONDANSETRON HCL 4 MG/2ML IJ SOLN
4.0000 mg | Freq: Once | INTRAMUSCULAR | Status: AC
  Administered 2016-12-11: 4 mg via INTRAVENOUS
  Filled 2016-12-11: qty 2

## 2016-12-11 MED ORDER — SODIUM CHLORIDE 0.9 % IV BOLUS (SEPSIS)
1000.0000 mL | Freq: Once | INTRAVENOUS | Status: AC
  Administered 2016-12-11: 1000 mL via INTRAVENOUS

## 2016-12-11 MED ORDER — IOPAMIDOL (ISOVUE-300) INJECTION 61%
100.0000 mL | Freq: Once | INTRAVENOUS | Status: AC | PRN
  Administered 2016-12-11: 100 mL via INTRAVENOUS

## 2016-12-11 MED ORDER — CEPHALEXIN 500 MG PO CAPS: 500.0000 mg | ORAL_CAPSULE | Freq: Three times a day (TID) | ORAL | 0 refills | Status: AC

## 2016-12-11 NOTE — ED Triage Notes (Signed)
Pt reports intermittent generalized abd pain for a week; says tonight the pain got stronger and she was unable to lay down; vomited some fish up yesterday; pt adds she's only had one bowel movement this week but it was soft stool; c/o urinary frequency but says that's normal for her; history of diverticulitis

## 2016-12-11 NOTE — Discharge Instructions (Signed)

## 2016-12-11 NOTE — ED Notes (Signed)
Patient transported to CT 

## 2016-12-11 NOTE — ED Notes (Signed)
Pt states emesis "one day last week". Pt states has not had a bowel movement "since one day last week". Pt complains of umbilicar tenderness. No known fever. resps unlabored. Pt denies dysuria.

## 2016-12-11 NOTE — ED Provider Notes (Signed)
[facility] Emergency Department Provider Note  ____________________________________________  Time seen: Approximately 2:49 AM  I have reviewed the triage vital signs and the nursing notes.   HISTORY  Chief Complaint Abdominal Pain   HPI Taylor Johnson is a 71 y.o. female with a history of diverticulitis presents for evaluation of abdominal pain. Patient reports intermittent sharp periumbilical abdominal pain that has been going on for a week. The pain was worse today. Currently no pain unless she pushes in her abdomen. Similar to prior episodes of diverticulitis. She has had decrease in by mouth intake however denies that the pain is worse postprandially. One episode of NBNB today. No diarrhea. Patient reports that she has been constipated with her last BM a few days ago. She does have a history of constipation. No fever or chills, nor dysuria or hematuria. Patient denies distention, she is passing flatus. Prior abdominal surgery includes hysterectomy.   Past Medical History:  Diagnosis Date  . Anxiety   . Asthma   . Depression   . Developmental delay   . Diverticulitis   . Leg swelling     Patient Active Problem List   Diagnosis Date Noted  . Cough 06/02/2016  . Localized edema 04/15/2016  . Microscopic hematuria 04/15/2016  . OSA on CPAP 11/18/2015  . Dysphagia 10/15/2015  . Hypoxia 09/12/2015  . Urge incontinence of urine 05/12/2015  . Diverticulosis of colon 05/11/2015  . Anxiety, generalized 05/11/2015    Past Surgical History:  Procedure Laterality Date  . ABDOMINAL HYSTERECTOMY     bleeding    Prior to Admission medications   Medication Sig Start Date End Date Taking? Authorizing Provider  sertraline (ZOLOFT) 50 MG tablet Take 50 mg by mouth daily.   Yes [provider]  cephALEXin (KEFLEX) 500 MG capsule Take 1 capsule (500 mg total) by mouth 3 (three) times daily. 12/11/16 12/18/16  Rudene Re, MD     Allergies Patient has no known allergies.  Family History  Problem Relation Age of Onset  . Hypertension Mother   . Kidney disease Mother   . Bladder Cancer Neg Hx     Social History Social History  Substance Use Topics  . Smoking status: Never Smoker  . Smokeless tobacco: Never Used  . Alcohol use No    Review of Systems  Constitutional: Negative for fever. Eyes: Negative for visual changes. ENT: Negative for sore throat. Neck: No neck pain  Cardiovascular: Negative for chest pain. Respiratory: Negative for shortness of breath. Gastrointestinal: + periumbilical abdominal pain, vomiting. No diarrhea. Genitourinary: Negative for dysuria. Musculoskeletal: Negative for back pain. Skin: Negative for rash. Neurological: Negative for headaches, weakness or numbness. Psych: No SI or HI  ____________________________________________   PHYSICAL EXAM:  VITAL SIGNS: ED Triage Vitals  Enc Vitals Group     BP 12/11/16 0039 (!) 177/58     Pulse Rate 12/11/16 0039 66     Resp 12/11/16 0039 18     Temp 12/11/16 0039 98.3 F (36.8 C)     Temp Source 12/11/16 0039 Oral     SpO2 12/11/16 0039 95 %     Weight 12/11/16 0039 260 lb 3.2 oz (118 kg)     Height 12/11/16 0039 5\' 5"  (1.651 m)     Head Circumference --      Peak Flow --      Pain Score 12/11/16 0040 0     Pain Loc --      Pain Edu? --  Excl. in [place]? --     Constitutional: Alert and oriented. Well appearing and in no apparent distress. HEENT:      Head: Normocephalic and atraumatic.         Eyes: Conjunctivae are normal. Sclera is non-icteric.       Mouth/Throat: Mucous membranes are moist.       Neck: Supple with no signs of meningismus. Cardiovascular: Regular rate and rhythm. No murmurs, gallops, or rubs. 2+ symmetrical distal pulses are present in all extremities. No JVD. Respiratory: Normal respiratory effort. Lungs are clear to auscultation bilaterally. No wheezes, crackles, or rhonchi.   Gastrointestinal: Soft, ttp over the periumbilical and LLQ, and non distended with positive bowel sounds. No rebound or guarding. Genitourinary: No CVA tenderness. Musculoskeletal: Nontender with normal range of motion in all extremities. No edema, cyanosis, or erythema of extremities. Neurologic: Normal speech and language. Face is symmetric. Moving all extremities. No gross focal neurologic deficits are appreciated. Skin: Skin is warm, dry and intact. No rash noted. Psychiatric: Mood and affect are normal. Speech and behavior are normal.  ____________________________________________   LABS (all labs ordered are listed, but only abnormal results are displayed)  Labs Reviewed  COMPREHENSIVE METABOLIC PANEL - Abnormal; Notable for the following:       Result Value   Potassium 3.3 (*)    Chloride 99 (*)    Glucose, Bld 106 (*)    ALT 12 (*)    All other components within normal limits  CBC - Abnormal; Notable for the following:    RDW 15.6 (*)    All other components within normal limits  URINALYSIS, COMPLETE (UACMP) WITH MICROSCOPIC - Abnormal; Notable for the following:    Color, Urine YELLOW (*)    APPearance CLEAR (*)    Hgb urine dipstick MODERATE (*)    Ketones, ur 20 (*)    Bacteria, UA FEW (*)    Squamous Epithelial / LPF 0-5 (*)    All other components within normal limits  URINE CULTURE  LIPASE, BLOOD   ____________________________________________  EKG  none  ____________________________________________  RADIOLOGY  CT a/p: 1. No acute abnormality seen to explain the patient's symptoms. 2. Diffuse diverticulosis noted along the entirety of the colon. No significant stool is seen within the colon. No evidence of diverticulitis at this time. 3. Multiple uterine fibroids, with underlying calcification. 4. Mild bibasilar atelectasis. Scattered blebs at the lung bases. 5. Mild coronary artery calcifications  seen. ____________________________________________   PROCEDURES  Procedure(s) performed: None Procedures Critical Care performed:  None ____________________________________________   INITIAL IMPRESSION / ASSESSMENT AND PLAN / ED COURSE  71 y.o. female with a history of diverticulitis presents for evaluation of 1 week of periumbilical abdominal pain, decrease appetite and one episode of vomiting today. Patient is extremely well appearing, no distress, she has normal vital signs, her abdomen is soft and nondistended, she has tenderness to palpation of periumbilical/left lower quadrant with no rebound or guarding. Differential diagnoses including diverticulitis versus appendicitis versus enteritis versus SBO versus UTI. CBC, CMP, lipase with no acute findings. UA concerning for urinary tract infection will treat with keflex. Will order CT abdomen and pelvis.    _________________________ 3:52 AM on 12/11/2016 -----------------------------------------  CT negative for acute pathology. Patient continues pain free. Will dc home on keflex and close f/u with PCP  Pertinent labs & imaging results that were available during my care of the patient were reviewed by me and considered in my medical decision making (see chart for  details).    ____________________________________________   FINAL CLINICAL IMPRESSION(S) / ED DIAGNOSES  Final diagnoses:  Acute cystitis with hematuria  Periumbilical abdominal pain      NEW MEDICATIONS STARTED DURING THIS VISIT:  New Prescriptions   CEPHALEXIN (KEFLEX) 500 MG CAPSULE    Take 1 capsule (500 mg total) by mouth 3 (three) times daily.     Note:  This document was prepared using Dragon voice recognition software and may include unintentional dictation errors.    Alfred Levins, [place], MD 12/11/16 [phone]

## 2016-12-12 LAB — URINE CULTURE

## 2017-01-11 ENCOUNTER — Ambulatory Visit (INDEPENDENT_AMBULATORY_CARE_PROVIDER_SITE_OTHER): Payer: Medicare Other | Admitting: Internal Medicine

## 2017-01-11 ENCOUNTER — Encounter: Payer: Self-pay | Admitting: Internal Medicine

## 2017-01-11 VITALS — BP 142/74 | HR 70 | Ht 64.0 in | Wt 271.0 lb

## 2017-01-11 DIAGNOSIS — N3001 Acute cystitis with hematuria: Secondary | ICD-10-CM | POA: Diagnosis not present

## 2017-01-11 MED ORDER — CIPROFLOXACIN HCL 250 MG PO TABS: 250.0000 mg | ORAL_TABLET | Freq: Two times a day (BID) | ORAL | 0 refills | Status: DC

## 2017-01-11 NOTE — Progress Notes (Signed)
Date:  01/11/2017   Name:  Taylor Johnson   DOB:  Sep 28, 1945   MRN:  [id number]  Patient accompanied today by her sister Callie. Chief Complaint: Urinary Frequency (No pain. No burning. Just going more often. ) and Abdominal Pain (Having trouble eating. Not able to hold a lot of food. Drinking a lot of water. This morning had a normal BM. No issues with bowel movements. Having some stomach pain and "jumping up in stomach". ) Urinary Frequency   This is a new problem. The current episode started in the past 7 days. The problem occurs every urination. The patient is experiencing no pain. There has been no fever. Associated symptoms include frequency and urgency. Pertinent negatives include no chills or hematuria.  Abdominal Pain  Associated symptoms include frequency. Pertinent negatives include no fever or hematuria.  Patient states that she had an IUD placed years ago and she does not think that it was ever removed.  She also says that she had a hysterectomy some years ago.  Review of chart shows a CT scan 08/2015 showing a fibroid uterus - no mention of IUD.   Review of Systems  Constitutional: Negative for chills, fatigue and fever.  Respiratory: Negative for chest tightness and shortness of breath.   Cardiovascular: Negative for chest pain.  Gastrointestinal: Positive for abdominal pain.  Genitourinary: Positive for frequency and urgency. Negative for dyspareunia, hematuria and vaginal bleeding.    Patient Active Problem List   Diagnosis Date Noted  . Cough 06/02/2016  . Localized edema 04/15/2016  . Microscopic hematuria 04/15/2016  . OSA on CPAP 11/18/2015  . Dysphagia 10/15/2015  . Hypoxia 09/12/2015  . Urge incontinence of urine 05/12/2015  . Diverticulosis of colon 05/11/2015  . Anxiety, generalized 05/11/2015    Prior to Admission medications   Not on File    No Known Allergies  Past Surgical History:  Procedure Laterality Date  . ABDOMINAL HYSTERECTOMY     bleeding    Social History  Substance Use Topics  . Smoking status: Never Smoker  . Smokeless tobacco: Never Used  . Alcohol use No     Medication list has been reviewed and updated.   Physical Exam  Constitutional: She is oriented to person, place, and time. She appears well-developed. No distress.  HENT:  Head: Normocephalic and atraumatic.  Cardiovascular: Normal rate and normal heart sounds.   Pulmonary/Chest: Effort normal and breath sounds normal. No respiratory distress.  Abdominal: Soft. She exhibits no distension. There is no tenderness. There is no guarding.  Musculoskeletal: Normal range of motion.  Neurological: She is alert and oriented to person, place, and time.  Skin: Skin is warm and dry. No rash noted.  Psychiatric: She has a normal mood and affect. Her behavior is normal. Thought content normal.  Nursing note and vitals reviewed.   BP (!) 142/74   Pulse 70   Ht 5\' 4"  (1.626 m)   Wt 271 lb (122.9 kg)   SpO2 97%   BMI 46.52 kg/m   Assessment and Plan: 1. Acute cystitis with hematuria May be contributing to abdominal complaints Will research best way to rule out retained IUD or if the CT was adequate - ciprofloxacin (CIPRO) 250 MG tablet; Take 1 tablet (250 mg total) by mouth 2 (two) times daily.  Dispense: 14 tablet; Refill: 0   Meds ordered this encounter  Medications  . ciprofloxacin (CIPRO) 250 MG tablet    Sig: Take 1 tablet (250 mg total) by  mouth 2 (two) times daily.    Dispense:  14 tablet    Refill:  0    Halina Maidens, MD [facility]  01/11/2017

## 2017-01-11 NOTE — Patient Instructions (Signed)

## 2017-01-12 ENCOUNTER — Emergency Department
Admission: EM | Admit: 2017-01-12 | Discharge: 2017-01-12 | Disposition: A | Discharge status: Home / Self Care | Payer: Medicare Other | Attending: Emergency Medicine | Admitting: Emergency Medicine

## 2017-01-12 ENCOUNTER — Emergency Department: Payer: Medicare Other

## 2017-01-12 ENCOUNTER — Encounter: Payer: Self-pay | Admitting: Emergency Medicine

## 2017-01-12 DIAGNOSIS — R1011 Right upper quadrant pain: Secondary | ICD-10-CM | POA: Diagnosis not present

## 2017-01-12 DIAGNOSIS — K8018 Calculus of gallbladder with other cholecystitis without obstruction: Secondary | ICD-10-CM | POA: Diagnosis not present

## 2017-01-12 DIAGNOSIS — R1010 Upper abdominal pain, unspecified: Secondary | ICD-10-CM

## 2017-01-12 DIAGNOSIS — J45909 Unspecified asthma, uncomplicated: Secondary | ICD-10-CM | POA: Insufficient documentation

## 2017-01-12 DIAGNOSIS — R1030 Lower abdominal pain, unspecified: Secondary | ICD-10-CM | POA: Insufficient documentation

## 2017-01-12 DIAGNOSIS — K8020 Calculus of gallbladder without cholecystitis without obstruction: Secondary | ICD-10-CM | POA: Diagnosis not present

## 2017-01-12 LAB — URINALYSIS, COMPLETE (UACMP) WITH MICROSCOPIC
Bilirubin Urine: NEGATIVE
Glucose, UA: NEGATIVE mg/dL
Ketones, ur: NEGATIVE mg/dL
Leukocytes, UA: NEGATIVE
Nitrite: NEGATIVE
Protein, ur: 30 mg/dL — AB
Specific Gravity, Urine: 1.019 (ref 1.005–1.030)
pH: 5 (ref 5.0–8.0)

## 2017-01-12 LAB — COMPREHENSIVE METABOLIC PANEL
ALT: 10 U/L — ABNORMAL LOW (ref 14–54)
AST: 14 U/L — ABNORMAL LOW (ref 15–41)
Albumin: 4.2 g/dL (ref 3.5–5.0)
Alkaline Phosphatase: 88 U/L (ref 38–126)
Anion gap: 6 (ref 5–15)
BUN: 14 mg/dL (ref 6–20)
CO2: 28 mmol/L (ref 22–32)
Calcium: 9.5 mg/dL (ref 8.9–10.3)
Chloride: 103 mmol/L (ref 101–111)
Creatinine, Ser: 0.96 mg/dL (ref 0.44–1.00)
GFR calc Af Amer: >60 mL/min (ref >60)
GFR calc non Af Amer: 59 mL/min — ABNORMAL LOW (ref >60)
Glucose, Bld: 109 mg/dL — ABNORMAL HIGH (ref 65–99)
Potassium: 3.4 mmol/L — ABNORMAL LOW (ref 3.5–5.1)
Sodium: 137 mmol/L (ref 135–145)
Total Bilirubin: 0.5 mg/dL (ref 0.3–1.2)
Total Protein: 8.3 g/dL — ABNORMAL HIGH (ref 6.5–8.1)

## 2017-01-12 LAB — LIPASE, BLOOD: Lipase: 15 U/L (ref 11–51)

## 2017-01-12 LAB — CBC
HCT: 39.5 % (ref 35.0–47.0)
Hemoglobin: 12.8 g/dL (ref 12.0–16.0)
MCH: 27.9 pg (ref 26.0–34.0)
MCHC: 32.3 g/dL (ref 32.0–36.0)
MCV: 86.5 fL (ref 80.0–100.0)
Platelets: 245 10*3/uL (ref 150–440)
RBC: 4.57 MIL/uL (ref 3.80–5.20)
RDW: 16.4 % — ABNORMAL HIGH (ref 11.5–14.5)
WBC: 6.7 10*3/uL (ref 3.6–11.0)

## 2017-01-12 MED ORDER — OMEPRAZOLE 20 MG PO CPDR: 20.0000 mg | DELAYED_RELEASE_CAPSULE | Freq: Two times a day (BID) | ORAL | 1 refills | Status: DC

## 2017-01-12 MED ORDER — OXYCODONE-ACETAMINOPHEN 5-325 MG PO TABS
2.0000 | ORAL_TABLET | Freq: Once | ORAL | Status: AC
  Administered 2017-01-12: 2 via ORAL
  Filled 2017-01-12: qty 2

## 2017-01-12 MED ORDER — OXYCODONE-ACETAMINOPHEN 5-325 MG PO TABS: 1.0000 | ORAL_TABLET | Freq: Four times a day (QID) | ORAL | 0 refills | Status: DC | PRN

## 2017-01-12 MED ORDER — DOCUSATE SODIUM 100 MG PO CAPS: 100.0000 mg | ORAL_CAPSULE | Freq: Every day | ORAL | 2 refills | Status: DC | PRN

## 2017-01-12 NOTE — ED Notes (Signed)
D/c inst to pt .  Pt signed esignature.

## 2017-01-12 NOTE — ED Notes (Signed)
Pt reports she has a uti.  Pt started meds yesterday for uti and states i'm not any better.  No vag bleeding.    No chest pain or sob.  Pt alert.

## 2017-01-12 NOTE — ED Triage Notes (Addendum)
Pt c/o lower abdominal pain today.  Was seen by PCP yesterday and dx with UTI; started cipro yesterday.  No vomiting, fevers, or symptoms other than pain.  Difficult to assess what made patient go to PCP to start with.  At first said pain but  Then said was not hurting until today. Pt has developmental delay hx and is difficult to get accurate hx. NAD. Ambulatory to triage. Sister with pt was not with her yesterday

## 2017-01-12 NOTE — ED Provider Notes (Addendum)
[facility] Emergency Department Provider Note       Time seen: ----------------------------------------- 7:50 PM on 01/12/2017 -----------------------------------------     I have reviewed the triage vital signs and the nursing notes.   HISTORY   Chief Complaint Abdominal Pain    HPI Taylor Johnson is a 71 y.o. female who presents to the ED for lower abdominal pain today. Patient was seen by her primary care doctor's office yesterday and diagnosed with UTI and started on Cipro. She's not had fevers, chills, vomiting or other complaints other than pain. Patient states this happened to her before where she was seen in the ER and had a CT scan that was negative.   Past Medical History:  Diagnosis Date  . Anxiety   . Asthma   . Depression   . Developmental delay   . Diverticulitis   . Leg swelling     Patient Active Problem List   Diagnosis Date Noted  . Cough 06/02/2016  . Localized edema 04/15/2016  . Microscopic hematuria 04/15/2016  . OSA on CPAP 11/18/2015  . Dysphagia 10/15/2015  . Hypoxia 09/12/2015  . Urge incontinence of urine 05/12/2015  . Diverticulosis of colon 05/11/2015  . Anxiety, generalized 05/11/2015    History reviewed. No pertinent surgical history.  Allergies Patient has no known allergies.  Social History Social History  Substance Use Topics  . Smoking status: Never Smoker  . Smokeless tobacco: Never Used  . Alcohol use No    Review of Systems Constitutional: Negative for fever. Cardiovascular: Negative for chest pain. Respiratory: Negative for shortness of breath. Gastrointestinal: Positive for abdominal pain Genitourinary: Negative for dysuria. Musculoskeletal: Negative for back pain. Skin: Negative for rash. Neurological: Negative for headaches, focal weakness or numbness.  All systems negative/normal/unremarkable except as stated in the HPI  ____________________________________________   PHYSICAL  EXAM:  VITAL SIGNS: ED Triage Vitals [01/12/17 1800]  Enc Vitals Group     BP (!) 162/62     Pulse Rate 95     Resp 18     Temp 98.4 F (36.9 C)     Temp Source Oral     SpO2 94 %     Weight 271 lb (122.9 kg)     Height 5\' 4"  (1.626 m)     Head Circumference      Peak Flow      Pain Score 10     Pain Loc      Pain Edu?      Excl. in [place]?     Constitutional: Alert and oriented. Well appearing and in no distress. Eyes: Conjunctivae are normal. Normal extraocular movements. ENT   Head: Normocephalic and atraumatic.   Nose: No congestion/rhinnorhea.   Mouth/Throat: Mucous membranes are moist.   Neck: No stridor. Cardiovascular: Normal rate, regular rhythm. No murmurs, rubs, or gallops. Respiratory: Normal respiratory effort without tachypnea nor retractions. Breath sounds are clear and equal bilaterally. No wheezes/rales/rhonchi. Gastrointestinal: Right upper quadrant tenderness, rebound or guarding. Normal bowel sounds. Musculoskeletal: Nontender with normal range of motion in extremities. No lower extremity tenderness nor edema. Neurologic:  Normal speech and language. No gross focal neurologic deficits are appreciated.  Skin:  Skin is warm, dry and intact. No rash noted. Psychiatric: Mood and affect are normal. Speech and behavior are normal.  ____________________________________________  ED COURSE:  Pertinent labs & imaging results that were available during my care of the patient were reviewed by me and considered in my medical decision making (see chart  for details). Patient presents for abdominal pain, we will assess with labs and imaging as indicated.   Procedures ____________________________________________   LABS (pertinent positives/negatives)  Labs Reviewed  COMPREHENSIVE METABOLIC PANEL - Abnormal; Notable for the following:       Result Value   Potassium 3.4 (*)    Glucose, Bld 109 (*)    Total Protein 8.3 (*)    AST 14 (*)    ALT 10 (*)     GFR calc non Af Amer 59 (*)    All other components within normal limits  CBC - Abnormal; Notable for the following:    RDW 16.4 (*)    All other components within normal limits  URINALYSIS, COMPLETE (UACMP) WITH MICROSCOPIC - Abnormal; Notable for the following:    Color, Urine YELLOW (*)    APPearance HAZY (*)    Hgb urine dipstick SMALL (*)    Protein, ur 30 (*)    Bacteria, UA RARE (*)    Squamous Epithelial / LPF 0-5 (*)    All other components within normal limits  LIPASE, BLOOD    RADIOLOGY Images were viewed by me  Right upper quadrant ultrasound, abdominal pain IMPRESSION: 1. Cholelithiasis without sonographic evidence for acute cholecystitis 2. Borderline enlarged common bile duct ____________________________________________  FINAL ASSESSMENT AND PLAN  Abdominal pain, Cholelithiasis  Plan: Patient's labs and imaging were dictated above. Patient had presented for upper abdominal pain which may be multifactorial. I will refer her to a general surgeon for follow-up and possibly HIDA scan. She'll also be placed on an antacid. She is stable for outpatient follow-up.   Earleen Newport, MD   Note: This note was generated in part or whole with voice recognition software. Voice recognition is usually quite accurate but there are transcription errors that can and very often do occur. I apologize for any typographical errors that were not detected and corrected.     Earleen Newport, MD 01/12/17 2128    Earleen Newport, MD 01/12/17 2149

## 2017-01-20 ENCOUNTER — Encounter: Payer: Self-pay | Admitting: Surgery

## 2017-01-20 ENCOUNTER — Encounter: Payer: Self-pay | Admitting: Internal Medicine

## 2017-01-20 ENCOUNTER — Ambulatory Visit (INDEPENDENT_AMBULATORY_CARE_PROVIDER_SITE_OTHER): Payer: Medicare Other | Admitting: Surgery

## 2017-01-20 ENCOUNTER — Telehealth: Payer: Self-pay

## 2017-01-20 VITALS — BP 159/77 | HR 67 | Temp 98.8°F | Ht 64.0 in | Wt 255.6 lb

## 2017-01-20 DIAGNOSIS — K8020 Calculus of gallbladder without cholecystitis without obstruction: Secondary | ICD-10-CM | POA: Insufficient documentation

## 2017-01-20 DIAGNOSIS — R109 Unspecified abdominal pain: Secondary | ICD-10-CM | POA: Diagnosis not present

## 2017-01-20 MED ORDER — HYDROCODONE-ACETAMINOPHEN 5-325 MG PO TABS: 1.0000 | ORAL_TABLET | Freq: Four times a day (QID) | ORAL | 0 refills | Status: DC | PRN

## 2017-01-20 NOTE — Patient Instructions (Signed)
Please continue to take the Prilosec twice daily. We will send surgical clearances to Dr.Bergland.  Please see your follow up appointment listed below.    Achalasia Achalasia is a disorder of the tube that connects your mouth and stomach (esophagus). This disorder makes it difficult for your esophagus to move liquids and food into your stomach. This makes it harder for you to swallow. Achalasia also affects the muscle between your stomach and your esophagus (lower esophageal sphincter, LES). Normally, this muscle relaxes after you swallow. With achalasia, it does not relax, and there is increased pressure in this area. What are the causes? The cause is not known. What increases the risk? Risk factors may include:  Age. Achalasia is more common in older adults.  Family history of achalasia.  What are the signs or symptoms? Signs and symptoms of achalasia may include:  Difficulty swallowing or painful swallowing.  Weight loss.  Chest pain.  Coughing or wheezing.  Heartburn.  Burping.  Vomiting.  Bad breath (halitosis).  How is this diagnosed? Achalasia may be diagnosed by:  X-ray.  Endoscopy. This is when your health care provider looks at your esophagus with a small flexible tube that has a video camera at the end.  Studies to see if there is increased pressure in your LES.  How is this treated? There is no cure for achalasia. Treatment is directed at managing your symptoms. Treatment may include:  Medicines.  Stretching or dilating your LES.  Surgery to reduce pressure in your LES.  Follow these instructions at home:  Take medicines only as directed by your health care provider.  Do not eat or drink while lying down.  Do not drink hot or cold liquids.  Eat your food slowly.  Keep all follow-up visits as directed by your health care provider. This is important. Contact a health care provider if:  Your symptoms don't go away after treatment.  You have  a fever.  You have any new symptoms.  Your pain is worse. Get help right away if:  You vomit blood.  Your have chest pain.  You have difficulty breathing. This information is not intended to replace advice given to you by your health care provider. Make sure you discuss any questions you have with your health care provider. Document Released: 04/13/2005 Document Revised: 12/10/2015 Document Reviewed: 12/10/2013 Elsevier Interactive Patient Education  2018 [place] Diet for Pancreatitis or Gallbladder Conditions A low-fat diet can be helpful if you have pancreatitis or a gallbladder condition. With these conditions, your pancreas and gallbladder have trouble digesting fats. A healthy eating plan with less fat will help rest your pancreas and gallbladder and reduce your symptoms. What do I need to know about this diet?  Eat a low-fat diet. ? Reduce your fat intake to less than 20-30% of your total daily calories. This is less than 50-60 g of fat per day. ? Remember that you need some fat in your diet. Ask your dietician what your daily goal should be. ? Choose nonfat and low-fat healthy foods. Look for the words "nonfat," "low fat," or "fat free." ? As a guide, look on the label and choose foods with less than 3 g of fat per serving. Eat only one serving.  Avoid alcohol.  Do not smoke. If you need help quitting, talk with your health care provider.  Eat small frequent meals instead of three large heavy meals. What foods can I eat? Grains Include healthy grains and starches such as  potatoes, wheat bread, fiber-rich cereal, and brown rice. Choose whole grain options whenever possible. In adults, whole grains should account for 45-65% of your daily calories. Fruits and Vegetables Eat plenty of fruits and vegetables. Fresh fruits and vegetables add fiber to your diet. Meats and Other Protein Sources Eat lean meat such as chicken and pork. Trim any fat off of meat before  cooking it. Eggs, fish, and beans are other sources of protein. In adults, these foods should account for 10-35% of your daily calories. Dairy Choose low-fat milk and dairy options. Dairy includes fat and protein, as well as calcium. Fats and Oils Limit high-fat foods such as fried foods, sweets, baked goods, sugary drinks. Other Creamy sauces and condiments, such as mayonnaise, can add extra fat. Think about whether or not you need to use them, or use smaller amounts or low fat options. What foods are not recommended?  High fat foods, such as: ? [employer]. ? Ice cream. ? [place] toast. ? Sweet rolls. ? Pizza. ? Cheese bread. ? Foods covered with batter, butter, creamy sauces, or cheese. ? Fried foods. ? Sugary drinks and desserts.  Foods that cause gas or bloating This information is not intended to replace advice given to you by your health care provider. Make sure you discuss any questions you have with your health care provider. Document Released: 07/09/2013 Document Revised: 12/10/2015 Document Reviewed: 06/17/2013 Elsevier Interactive Patient Education  2017 [employer].

## 2017-01-20 NOTE — Telephone Encounter (Signed)
Medical Clearance faxed to Dr.Berglund at this time.  Pulmonary Clearance faxed to Dr.Fleming at this time.

## 2017-01-20 NOTE — Progress Notes (Signed)
01/20/2017  Reason for Visit:  Abdominal pain  History of Present Illness: Taylor Johnson is a 71 y.o. female referred from ED after visiting on 6/28 with abdominal pain.  She had been diagnosed with UTI on 6/27 by her PCP and started on Cipro.  She reports that since 6/28 she has had continued pain issues, with pain worse particularly at night time.  She denies any other symptoms, though did have one episode of nausea/emesis after eating peaches.  In the ED, she had an ultrasound which showed cholelithiasis and had normal LFTs and WBC.  She was discharged with prescription for Prilosec and Percocet.  She reports the pain is not better with the pain medication.  She reports her pain in more in the mid abdomen region and not in the right upper quadrant.  Reports that she also has right shoulder pain but it's not at the same time as the abdominal pain necessarily.  Denies any nausea.  Denies other areas of abdominal pain.  Since the visit to the ED, she has been only eating fruit cups and popsicles and her sisters report that she has been moaning and yelling because she's a compulsive eater and wants fried food.  She also reports that at night she might also have burning sensation going up to her throat.  She has sleep apnea and from her description, sounds like she does not sleep lying flat.  She could not tolerate a CPAP mask.  The patient does have history of developmental delay and it's hard to get her to engage in conversation and describe her symptoms better.    Past Medical History: Past Medical History:  Diagnosis Date  . Anxiety   . Anxiety, generalized 05/11/2015  . Asthma   . Cough 06/02/2016   Chronic - followed by Pulmonary  . Depression   . Developmental delay   . Diverticulitis   . Diverticulosis of colon 05/11/2015  . Dysphagia 10/15/2015   Routine Ba Swallow normal; rec modified barium swallow   . Hypoxia 09/12/2015  . Leg swelling   . Localized edema 04/15/2016  . Microscopic  hematuria 04/15/2016  . OSA on CPAP 11/18/2015   CPAP @ 18 cm H2O started 02/2016  . Urge incontinence of urine 05/12/2015     Past Surgical History: Past Surgical History:  Procedure Laterality Date  . ABDOMINAL HYSTERECTOMY    . COLON SURGERY      Home Medications: Prior to Admission medications   Medication Sig Start Date End Date Taking? Authorizing Provider  ciprofloxacin (CIPRO) 250 MG tablet Take 1 tablet (250 mg total) by mouth 2 (two) times daily. 01/11/17  Yes [name], MD  docusate sodium (COLACE) 100 MG capsule Take 1 capsule (100 mg total) by mouth daily as needed. 01/12/17 01/12/18 Yes Earleen Newport, MD  omeprazole (PRILOSEC) 20 MG capsule Take 1 capsule (20 mg total) by mouth 2 (two) times daily. 01/12/17 01/12/18 Yes Earleen Newport, MD  HYDROcodone-acetaminophen (NORCO) 5-325 MG tablet Take 1 tablet by mouth every 6 (six) hours as needed for moderate pain. 01/20/17   Olean Ree, MD    Allergies: No Known Allergies  Social History:  reports that she has never smoked. She has never used smokeless tobacco. She reports that she does not drink alcohol or use drugs.   Family History: Family History  Problem Relation Age of Onset  . Hypertension Mother   . Kidney disease Mother   . Bladder Cancer Neg Hx  Review of Systems: Review of Systems  Constitutional: Negative for chills and fever.  HENT: Negative for hearing loss.   Eyes: Negative for blurred vision.  Respiratory: Negative for cough.   Cardiovascular: Negative for chest pain.  Gastrointestinal: Positive for abdominal pain. Negative for constipation, diarrhea, nausea and vomiting.  Genitourinary: Negative for dysuria.  Musculoskeletal: Positive for joint pain (right shoulder pain). Negative for myalgias.  Skin: Negative for rash.  Neurological: Negative for dizziness.  Psychiatric/Behavioral: Negative for depression.    Physical Exam BP (!) 159/77   Pulse 67   Temp 98.8 F (37.1  C) (Oral)   Ht 5\' 4"  (1.626 m)   Wt 115.9 kg (255 lb 9.6 oz)   BMI 43.87 kg/m  CONSTITUTIONAL: No acute distress, looks away. HEENT:  Normocephalic, atraumatic, extraocular motion intact. NECK: Trachea is midline, and there is no jugular venous distension.  RESPIRATORY:  Lungs are clear, and breath sounds are equal bilaterally. Normal respiratory effort without pathologic use of accessory muscles. CARDIOVASCULAR: Heart is regular without murmurs, gallops, or rubs. GI: The abdomen is soft, nondistended, with no significant tenderness, though the patient reports some pain in low epigastric area.  There is no pain in the right upper quadrant area. There were no palpable masses.  MUSCULOSKELETAL:  Normal muscle strength and tone in all four extremities.  No peripheral edema or cyanosis. SKIN: Skin turgor is normal. There are no pathologic skin lesions.  NEUROLOGIC:  Motor and sensation is grossly normal.  Cranial nerves are grossly intact. PSYCH:  Alert and oriented to person, place and time. Affect is normal.  Laboratory Analysis: Labs from 6/28:  tbili 0.5, AST 14, ALT 10, AP 88, lipase 15.  WBC 6.7  Imaging: U/S from 6/28:  1. Cholelithiasis without sonographic evidence for acute cholecystitis 2. Borderline enlarged common bile duct  Assessment and Plan: This is a 71 y.o. female who presents with abdominal pain.  I have independently viewed the patient's imaging studies and reviewed her laboratory studies.  Overall, her U/S was fairly unremarkable with some gallstones and otherwise no wall thickening or pericholecystic fluid and negative Murphy's sign.  Her LFTs were normal and WBC was normal.  Her symptoms are not typical for gallbladder pathology.  This could be potentially reflux / ulcer etiology vs other abdominal etiology.  There is no right upper quadrant pain, and the epigastric pain seems to be closer to mid abdomen than subxiphoid.  Her symptoms are worse at night time and dont  seem to be associated with meals.    Discussed with patient that first we should try a course of Prilosec which was prescribed by the ED.  Will change her pain medication as well to see if it helps better.  Discussed also that she can have a low fat diet and that fruit cups and popsicles are not adequate.  Will give low fat diet instructions as well.  If there is no improvement, then her pain could be biliary in etiology.  As a precaution, will send clearance forms for her PCP and pulmonologist for possible surgery.  The patient's sisters expressed a lot of frustration with this plan because the patient has been moaning and yelling about her food and are getting tired of it.  Discussed with them that because it's unclear the etiology of her pain, I would not want to put her under surgical risk unnecessarily.  They understand this plan and all of their questions have been answered.  Patient will follow up in 2  weeks to assess her symptoms and progress.  Face-to-face time spent with the patient and care providers was 60 minutes, with more than 50% of the time spent counseling, educating, and coordinating care of the patient.     Melvyn Neth, [place]

## 2017-01-23 ENCOUNTER — Telehealth: Payer: Self-pay

## 2017-01-23 NOTE — Telephone Encounter (Signed)
Medical Clearance denied from Dr.Berglund at this time due to requesting Cardiologist and Pulmonary clearances.  Patient has appointment 01/30/17 with Dr.Fleming and Dr .Nehemiah Massed 02/01/17 @ 11:30.  Spoke with patients sister regarding above appointments.  Patient sister verbalized understanding.  Cardiac Clearance faxed to [facility] at this time.  Medical Clearance faxed to Dr.Berglund at this time.

## 2017-01-25 ENCOUNTER — Telehealth: Payer: Self-pay

## 2017-01-25 NOTE — Telephone Encounter (Signed)
Called Taylor Johnson back at this time and she informed me that her sister has not been able to keep any of her medication down as well as water. I asked if she had been eating anything prior to taking the medication she stated that she has not been eating before taking medication. I told her that the pain medication needed some type of substances to bind to in order to work properly. I also told her to eat before taking any medication. She stated that the patient(her sister) is probably going to be adherent to eating since she has been throwing up the medication. I told her I understood but to try and have her to eat a small meal prior to taking her medication in order for it to help with her pain. She also stated that the patient may run out of pain medication due to throwing up so much of it. I told her to try and stretch out her medication as much as possible by taking ibuprofen in between taking the pain medication with a meal. I told her to give our office a call back if shes still not able to keep the pain medication down. And if she ran out of pain medication and needed more that she would have to be seen in office. Patient's sister Taylor Johnson) verbalized understanding at this time.

## 2017-01-25 NOTE — Telephone Encounter (Signed)
Patient was seen in our office on 01/20/17 for abdominal pain. [name] (sister) is calling because Taylor Johnson is in severe pain. Her medications are not working and nothing is staying down. Not even her medications or water. [name] is wanting to know if she should take Tatanisha to the emergency room. You can call [name] at [phone].

## 2017-01-26 ENCOUNTER — Encounter: Payer: Self-pay | Admitting: Emergency Medicine

## 2017-01-26 DIAGNOSIS — N3941 Urge incontinence: Secondary | ICD-10-CM | POA: Diagnosis present

## 2017-01-26 DIAGNOSIS — R6250 Unspecified lack of expected normal physiological development in childhood: Secondary | ICD-10-CM | POA: Diagnosis not present

## 2017-01-26 DIAGNOSIS — K5750 Diverticulosis of both small and large intestine without perforation or abscess without bleeding: Principal | ICD-10-CM | POA: Diagnosis present

## 2017-01-26 DIAGNOSIS — R1312 Dysphagia, oropharyngeal phase: Secondary | ICD-10-CM | POA: Diagnosis not present

## 2017-01-26 DIAGNOSIS — R1310 Dysphagia, unspecified: Secondary | ICD-10-CM | POA: Diagnosis present

## 2017-01-26 DIAGNOSIS — K8020 Calculus of gallbladder without cholecystitis without obstruction: Secondary | ICD-10-CM | POA: Diagnosis not present

## 2017-01-26 DIAGNOSIS — Z7401 Bed confinement status: Secondary | ICD-10-CM | POA: Diagnosis not present

## 2017-01-26 DIAGNOSIS — G4733 Obstructive sleep apnea (adult) (pediatric): Secondary | ICD-10-CM | POA: Diagnosis not present

## 2017-01-26 DIAGNOSIS — R109 Unspecified abdominal pain: Secondary | ICD-10-CM | POA: Diagnosis not present

## 2017-01-26 DIAGNOSIS — Z79899 Other long term (current) drug therapy: Secondary | ICD-10-CM

## 2017-01-26 DIAGNOSIS — K5730 Diverticulosis of large intestine without perforation or abscess without bleeding: Secondary | ICD-10-CM | POA: Diagnosis not present

## 2017-01-26 DIAGNOSIS — E876 Hypokalemia: Secondary | ICD-10-CM | POA: Diagnosis not present

## 2017-01-26 DIAGNOSIS — Z841 Family history of disorders of kidney and ureter: Secondary | ICD-10-CM | POA: Diagnosis not present

## 2017-01-26 DIAGNOSIS — Z801 Family history of malignant neoplasm of trachea, bronchus and lung: Secondary | ICD-10-CM

## 2017-01-26 DIAGNOSIS — R262 Difficulty in walking, not elsewhere classified: Secondary | ICD-10-CM | POA: Diagnosis not present

## 2017-01-26 DIAGNOSIS — I10 Essential (primary) hypertension: Secondary | ICD-10-CM | POA: Diagnosis not present

## 2017-01-26 DIAGNOSIS — I951 Orthostatic hypotension: Secondary | ICD-10-CM | POA: Diagnosis present

## 2017-01-26 DIAGNOSIS — F411 Generalized anxiety disorder: Secondary | ICD-10-CM | POA: Diagnosis present

## 2017-01-26 DIAGNOSIS — Z9889 Other specified postprocedural states: Secondary | ICD-10-CM

## 2017-01-26 DIAGNOSIS — Z8249 Family history of ischemic heart disease and other diseases of the circulatory system: Secondary | ICD-10-CM

## 2017-01-26 DIAGNOSIS — K219 Gastro-esophageal reflux disease without esophagitis: Secondary | ICD-10-CM | POA: Diagnosis present

## 2017-01-26 DIAGNOSIS — K314 Gastric diverticulum: Secondary | ICD-10-CM | POA: Diagnosis not present

## 2017-01-26 DIAGNOSIS — F329 Major depressive disorder, single episode, unspecified: Secondary | ICD-10-CM | POA: Diagnosis present

## 2017-01-26 DIAGNOSIS — M6281 Muscle weakness (generalized): Secondary | ICD-10-CM | POA: Diagnosis not present

## 2017-01-26 DIAGNOSIS — M109 Gout, unspecified: Secondary | ICD-10-CM | POA: Diagnosis present

## 2017-01-26 DIAGNOSIS — J45909 Unspecified asthma, uncomplicated: Secondary | ICD-10-CM | POA: Diagnosis not present

## 2017-01-26 DIAGNOSIS — K5900 Constipation, unspecified: Secondary | ICD-10-CM | POA: Diagnosis not present

## 2017-01-26 DIAGNOSIS — R112 Nausea with vomiting, unspecified: Secondary | ICD-10-CM | POA: Diagnosis not present

## 2017-01-26 DIAGNOSIS — Z90710 Acquired absence of both cervix and uterus: Secondary | ICD-10-CM

## 2017-01-26 DIAGNOSIS — M1000 Idiopathic gout, unspecified site: Secondary | ICD-10-CM | POA: Diagnosis not present

## 2017-01-26 DIAGNOSIS — G8911 Acute pain due to trauma: Secondary | ICD-10-CM | POA: Diagnosis not present

## 2017-01-26 MED ORDER — ONDANSETRON 4 MG PO TBDP
4.0000 mg | ORAL_TABLET | Freq: Once | ORAL | Status: AC
  Administered 2017-01-27: 4 mg via ORAL
  Filled 2017-01-26: qty 1

## 2017-01-26 NOTE — ED Triage Notes (Signed)
Pt ambulatory to triage with steady gait, no distress noted. Pt c/o abdominal pain below the umbilicus and N/V x1 week. Pt seen in ED on July 5th, diagnosed with gallstones. Pt referred to general surgeon on Monday for possible gallbladder removal. Pt to ED due to inability to sleep due to pain and decrease in oral intake due to N/V.

## 2017-01-27 ENCOUNTER — Inpatient Hospital Stay
Admission: EM | Admit: 2017-01-27 | Discharge: 2017-02-02 | DRG: 392 | Disposition: A | Discharge status: Skilled Nursing Facility | Payer: Medicare Other | Attending: Internal Medicine | Admitting: Internal Medicine

## 2017-01-27 ENCOUNTER — Emergency Department: Payer: Medicare Other

## 2017-01-27 DIAGNOSIS — R6250 Unspecified lack of expected normal physiological development in childhood: Secondary | ICD-10-CM | POA: Diagnosis present

## 2017-01-27 DIAGNOSIS — J45909 Unspecified asthma, uncomplicated: Secondary | ICD-10-CM | POA: Diagnosis present

## 2017-01-27 DIAGNOSIS — R1310 Dysphagia, unspecified: Secondary | ICD-10-CM | POA: Diagnosis present

## 2017-01-27 DIAGNOSIS — K8020 Calculus of gallbladder without cholecystitis without obstruction: Secondary | ICD-10-CM

## 2017-01-27 DIAGNOSIS — Z9889 Other specified postprocedural states: Secondary | ICD-10-CM | POA: Diagnosis not present

## 2017-01-27 DIAGNOSIS — F411 Generalized anxiety disorder: Secondary | ICD-10-CM | POA: Diagnosis present

## 2017-01-27 DIAGNOSIS — R109 Unspecified abdominal pain: Secondary | ICD-10-CM | POA: Diagnosis present

## 2017-01-27 DIAGNOSIS — K219 Gastro-esophageal reflux disease without esophagitis: Secondary | ICD-10-CM | POA: Diagnosis present

## 2017-01-27 DIAGNOSIS — M109 Gout, unspecified: Secondary | ICD-10-CM | POA: Diagnosis present

## 2017-01-27 DIAGNOSIS — Z79899 Other long term (current) drug therapy: Secondary | ICD-10-CM | POA: Diagnosis not present

## 2017-01-27 DIAGNOSIS — I951 Orthostatic hypotension: Secondary | ICD-10-CM | POA: Diagnosis present

## 2017-01-27 DIAGNOSIS — N3941 Urge incontinence: Secondary | ICD-10-CM | POA: Diagnosis present

## 2017-01-27 DIAGNOSIS — Z90710 Acquired absence of both cervix and uterus: Secondary | ICD-10-CM | POA: Diagnosis not present

## 2017-01-27 DIAGNOSIS — I10 Essential (primary) hypertension: Secondary | ICD-10-CM | POA: Diagnosis present

## 2017-01-27 DIAGNOSIS — G4733 Obstructive sleep apnea (adult) (pediatric): Secondary | ICD-10-CM | POA: Diagnosis present

## 2017-01-27 DIAGNOSIS — E876 Hypokalemia: Secondary | ICD-10-CM | POA: Diagnosis present

## 2017-01-27 DIAGNOSIS — Z801 Family history of malignant neoplasm of trachea, bronchus and lung: Secondary | ICD-10-CM | POA: Diagnosis not present

## 2017-01-27 DIAGNOSIS — Z8249 Family history of ischemic heart disease and other diseases of the circulatory system: Secondary | ICD-10-CM | POA: Diagnosis not present

## 2017-01-27 DIAGNOSIS — Z841 Family history of disorders of kidney and ureter: Secondary | ICD-10-CM | POA: Diagnosis not present

## 2017-01-27 DIAGNOSIS — R112 Nausea with vomiting, unspecified: Secondary | ICD-10-CM

## 2017-01-27 DIAGNOSIS — F329 Major depressive disorder, single episode, unspecified: Secondary | ICD-10-CM | POA: Diagnosis present

## 2017-01-27 DIAGNOSIS — K5750 Diverticulosis of both small and large intestine without perforation or abscess without bleeding: Secondary | ICD-10-CM | POA: Diagnosis present

## 2017-01-27 HISTORY — DX: Unspecified abdominal pain: R10.9

## 2017-01-27 LAB — COMPREHENSIVE METABOLIC PANEL
ALT: 42 U/L (ref 14–54)
AST: 52 U/L — ABNORMAL HIGH (ref 15–41)
Albumin: 4 g/dL (ref 3.5–5.0)
Alkaline Phosphatase: 84 U/L (ref 38–126)
Anion gap: 11 (ref 5–15)
BUN: 13 mg/dL (ref 6–20)
CO2: 29 mmol/L (ref 22–32)
Calcium: 9.6 mg/dL (ref 8.9–10.3)
Chloride: 93 mmol/L — ABNORMAL LOW (ref 101–111)
Creatinine, Ser: 0.85 mg/dL (ref 0.44–1.00)
GFR calc Af Amer: >60 mL/min (ref >60)
GFR calc non Af Amer: >60 mL/min (ref >60)
Glucose, Bld: 128 mg/dL — ABNORMAL HIGH (ref 65–99)
Potassium: 3.4 mmol/L — ABNORMAL LOW (ref 3.5–5.1)
Sodium: 133 mmol/L — ABNORMAL LOW (ref 135–145)
Total Bilirubin: 0.8 mg/dL (ref 0.3–1.2)
Total Protein: 7.8 g/dL (ref 6.5–8.1)

## 2017-01-27 LAB — CBC
HCT: 38.8 % (ref 35.0–47.0)
HCT: 39.8 % (ref 35.0–47.0)
Hemoglobin: 12.8 g/dL (ref 12.0–16.0)
Hemoglobin: 13 g/dL (ref 12.0–16.0)
MCH: 27.7 pg (ref 26.0–34.0)
MCH: 28.4 pg (ref 26.0–34.0)
MCHC: 32.6 g/dL (ref 32.0–36.0)
MCHC: 32.9 g/dL (ref 32.0–36.0)
MCV: 84.9 fL (ref 80.0–100.0)
MCV: 86.1 fL (ref 80.0–100.0)
Platelets: 205 10*3/uL (ref 150–440)
Platelets: 228 10*3/uL (ref 150–440)
RBC: 4.51 MIL/uL (ref 3.80–5.20)
RBC: 4.69 MIL/uL (ref 3.80–5.20)
RDW: 15.9 % — ABNORMAL HIGH (ref 11.5–14.5)
RDW: 16.1 % — ABNORMAL HIGH (ref 11.5–14.5)
WBC: 8.2 10*3/uL (ref 3.6–11.0)
WBC: 8.2 10*3/uL (ref 3.6–11.0)

## 2017-01-27 LAB — URINALYSIS, COMPLETE (UACMP) WITH MICROSCOPIC
Bacteria, UA: NONE SEEN
Bilirubin Urine: NEGATIVE
Glucose, UA: NEGATIVE mg/dL
Ketones, ur: NEGATIVE mg/dL
Leukocytes, UA: NEGATIVE
Nitrite: NEGATIVE
Protein, ur: NEGATIVE mg/dL
Specific Gravity, Urine: 1.01 (ref 1.005–1.030)
pH: 6 (ref 5.0–8.0)

## 2017-01-27 LAB — CREATININE, SERUM
Creatinine, Ser: 0.95 mg/dL (ref 0.44–1.00)
GFR calc Af Amer: >60 mL/min (ref >60)
GFR calc non Af Amer: 59 mL/min — ABNORMAL LOW (ref >60)

## 2017-01-27 LAB — LIPASE, BLOOD: Lipase: 14 U/L (ref 11–51)

## 2017-01-27 MED ORDER — SODIUM CHLORIDE 0.9 % IV SOLN
INTRAVENOUS | Status: DC
  Administered 2017-01-27 – 2017-01-30 (×8): via INTRAVENOUS

## 2017-01-27 MED ORDER — BISACODYL 5 MG PO TBEC
5.0000 mg | DELAYED_RELEASE_TABLET | Freq: Every day | ORAL | Status: DC | PRN
  Administered 2017-01-31: 5 mg via ORAL
  Filled 2017-01-27: qty 1

## 2017-01-27 MED ORDER — HEPARIN SODIUM (PORCINE) 5000 UNIT/ML IJ SOLN
5000.0000 [IU] | Freq: Three times a day (TID) | INTRAMUSCULAR | Status: DC
  Administered 2017-01-27 – 2017-02-02 (×17): 5000 [IU] via SUBCUTANEOUS
  Filled 2017-01-27 (×17): qty 1

## 2017-01-27 MED ORDER — DOCUSATE SODIUM 100 MG PO CAPS
100.0000 mg | ORAL_CAPSULE | Freq: Two times a day (BID) | ORAL | Status: DC
  Administered 2017-01-28 – 2017-02-02 (×9): 100 mg via ORAL
  Filled 2017-01-27 (×10): qty 1

## 2017-01-27 MED ORDER — ACETAMINOPHEN 650 MG RE SUPP: 650.0000 mg | Freq: Four times a day (QID) | RECTAL | Status: DC | PRN

## 2017-01-27 MED ORDER — PANTOPRAZOLE SODIUM 40 MG PO TBEC
40.0000 mg | DELAYED_RELEASE_TABLET | Freq: Every day | ORAL | Status: DC
  Administered 2017-01-28: 40 mg via ORAL
  Filled 2017-01-27: qty 1

## 2017-01-27 MED ORDER — SODIUM CHLORIDE 0.9 % IV BOLUS (SEPSIS)
1000.0000 mL | Freq: Once | INTRAVENOUS | Status: AC
Start: 2017-01-27 — End: 2017-01-27
  Administered 2017-01-27: 1000 mL via INTRAVENOUS

## 2017-01-27 MED ORDER — METOCLOPRAMIDE HCL 5 MG/ML IJ SOLN
10.0000 mg | Freq: Once | INTRAMUSCULAR | Status: AC
  Administered 2017-01-27: 10 mg via INTRAVENOUS

## 2017-01-27 MED ORDER — METOCLOPRAMIDE HCL 5 MG/ML IJ SOLN
INTRAMUSCULAR | Status: AC
  Administered 2017-01-27: 10 mg via INTRAVENOUS
  Filled 2017-01-27: qty 2

## 2017-01-27 MED ORDER — PROMETHAZINE HCL 25 MG/ML IJ SOLN
12.5000 mg | Freq: Four times a day (QID) | INTRAMUSCULAR | Status: DC | PRN
  Administered 2017-01-27 – 2017-01-28 (×2): 12.5 mg via INTRAVENOUS
  Filled 2017-01-27 (×2): qty 1

## 2017-01-27 MED ORDER — TRAZODONE HCL 50 MG PO TABS
25.0000 mg | ORAL_TABLET | Freq: Every evening | ORAL | Status: DC | PRN
  Administered 2017-01-27: 25 mg via ORAL
  Filled 2017-01-27: qty 1

## 2017-01-27 MED ORDER — MORPHINE SULFATE (PF) 4 MG/ML IV SOLN
4.0000 mg | Freq: Once | INTRAVENOUS | Status: AC
  Administered 2017-01-27: 4 mg via INTRAVENOUS
  Filled 2017-01-27: qty 1

## 2017-01-27 MED ORDER — MORPHINE SULFATE (PF) 2 MG/ML IV SOLN
2.0000 mg | INTRAVENOUS | Status: DC | PRN
  Administered 2017-01-27: 2 mg via INTRAVENOUS
  Filled 2017-01-27: qty 1

## 2017-01-27 MED ORDER — ONDANSETRON HCL 4 MG/2ML IJ SOLN
4.0000 mg | Freq: Once | INTRAMUSCULAR | Status: AC
  Administered 2017-01-27: 4 mg via INTRAVENOUS
  Filled 2017-01-27: qty 2

## 2017-01-27 MED ORDER — ACETAMINOPHEN 325 MG PO TABS: 650.0000 mg | ORAL_TABLET | Freq: Four times a day (QID) | ORAL | Status: DC | PRN

## 2017-01-27 MED ORDER — HYDROCODONE-ACETAMINOPHEN 5-325 MG PO TABS
1.0000 | ORAL_TABLET | ORAL | Status: DC | PRN
  Administered 2017-01-28: 2 via ORAL
  Filled 2017-01-27: qty 2

## 2017-01-27 MED ORDER — HYDRALAZINE HCL 20 MG/ML IJ SOLN
10.0000 mg | INTRAMUSCULAR | Status: DC | PRN
  Administered 2017-01-28: 10 mg via INTRAVENOUS
  Filled 2017-01-27: qty 1

## 2017-01-27 MED ORDER — ONDANSETRON HCL 4 MG/2ML IJ SOLN
4.0000 mg | Freq: Four times a day (QID) | INTRAMUSCULAR | Status: DC | PRN
  Administered 2017-01-27 – 2017-02-01 (×6): 4 mg via INTRAVENOUS
  Filled 2017-01-27 (×6): qty 2

## 2017-01-27 MED ORDER — HYDRALAZINE HCL 25 MG PO TABS
25.0000 mg | ORAL_TABLET | Freq: Three times a day (TID) | ORAL | Status: DC
  Administered 2017-01-27 – 2017-01-31 (×9): 25 mg via ORAL
  Filled 2017-01-27 (×13): qty 1

## 2017-01-27 MED ORDER — IOPAMIDOL (ISOVUE-300) INJECTION 61%
100.0000 mL | Freq: Once | INTRAVENOUS | Status: AC | PRN
  Administered 2017-01-27: 100 mL via INTRAVENOUS

## 2017-01-27 MED ORDER — ONDANSETRON HCL 4 MG PO TABS
4.0000 mg | ORAL_TABLET | Freq: Four times a day (QID) | ORAL | Status: DC | PRN
  Administered 2017-02-02: 4 mg via ORAL
  Filled 2017-01-27: qty 1

## 2017-01-27 NOTE — ED Notes (Signed)
Pt sat up in bed, given ginger ale and instructed to drink for PO challenge. Family at bedside, pt drinking at this time.

## 2017-01-27 NOTE — ED Notes (Signed)
ED Provider at bedside. 

## 2017-01-27 NOTE — ED Notes (Signed)
Pt attempted to drink, emesis of green bile looking fluid.

## 2017-01-27 NOTE — ED Provider Notes (Signed)
[facility] Emergency Department Provider Note   ____________________________________________   First MD Initiated Contact with Patient 01/27/17 [phone]     (approximate)  I have reviewed the triage vital signs and the nursing notes.   HISTORY  Chief Complaint Abdominal Pain and Emesis    HPI Taylor Johnson is a 71 y.o. female who comes into the hospital today with some abdominal pain. She reports that it started a while ago. She's been going back and forth to different doctors about it for a couple of weeks. The patient reports that it hurt really bad and then she went to the doctor. She reports that she was given medicine. The patient's family member reports that she was diagnosed with a UTI and saw her primary care physician. She came into the emergency department on 628 with some abdominal pain after that episode and was told she had gallstones. The patient in follow-up with the surgeon who told her he wasn't sure her pain was due to gallstones. The patient's family member states that since she seemed a surgeon she's been having vomiting. She reports that she is unsure she's keeping anything down. The patient was having severe pain today so she decided to bring her into the hospital. They're unsure if this is due to her gallbladder or her diverticulosis. The patient states that she could not take the pain anymore. The patient is to return to the surgeon on Monday. The patient rates her pain a 10 out of 10 in intensity.   Past Medical History:  Diagnosis Date  . Anxiety   . Anxiety, generalized 05/11/2015  . Asthma   . Cough 06/02/2016   Chronic - followed by Pulmonary  . Depression   . Developmental delay   . Diverticulitis   . Diverticulosis of colon 05/11/2015  . Dysphagia 10/15/2015   Routine Ba Swallow normal; rec modified barium swallow   . Hypoxia 09/12/2015  . Leg swelling   . Localized edema 04/15/2016  . Microscopic hematuria 04/15/2016  . OSA on  CPAP 11/18/2015   CPAP @ 18 cm H2O started 02/2016  . Urge incontinence of urine 05/12/2015    Patient Active Problem List   Diagnosis Date Noted  . Gall stones 01/20/2017  . Cough 06/02/2016  . Localized edema 04/15/2016  . Microscopic hematuria 04/15/2016  . OSA on CPAP 11/18/2015  . Dysphagia 10/15/2015  . Hypoxia 09/12/2015  . Urge incontinence of urine 05/12/2015  . Diverticulosis of colon 05/11/2015  . Anxiety, generalized 05/11/2015    Past Surgical History:  Procedure Laterality Date  . ABDOMINAL HYSTERECTOMY    . COLON SURGERY      Prior to Admission medications   Medication Sig Start Date End Date Taking? Authorizing Provider  docusate sodium (COLACE) 100 MG capsule Take 1 capsule (100 mg total) by mouth daily as needed. 01/12/17 01/12/18 Yes Earleen Newport, MD  HYDROcodone-acetaminophen (NORCO) 5-325 MG tablet Take 1 tablet by mouth every 6 (six) hours as needed for moderate pain. 01/20/17  Yes Piscoya, Jacqulyn Bath, MD  omeprazole (PRILOSEC) 20 MG capsule Take 1 capsule (20 mg total) by mouth 2 (two) times daily. 01/12/17 01/12/18 Yes Earleen Newport, MD  ciprofloxacin (CIPRO) 250 MG tablet Take 1 tablet (250 mg total) by mouth 2 (two) times daily. Patient not taking: Reported on 01/27/2017 01/11/17   [name], MD    Allergies Patient has no known allergies.  Family History  Problem Relation Age of Onset  . Hypertension  Mother   . Kidney disease Mother   . Bladder Cancer Neg Hx     Social History Social History  Substance Use Topics  . Smoking status: Never Smoker  . Smokeless tobacco: Never Used  . Alcohol use No    Review of Systems  Constitutional: No fever/chills Eyes: No visual changes. ENT: No sore throat. Cardiovascular: Denies chest pain. Respiratory: Denies shortness of breath. Gastrointestinal: abdominal pain.   nausea,  vomiting.  No diarrhea.  No constipation. Genitourinary: Negative for dysuria. Musculoskeletal: Negative for back  pain. Skin: Negative for rash. Neurological: Negative for headaches, focal weakness or numbness.   ____________________________________________   PHYSICAL EXAM:  VITAL SIGNS: ED Triage Vitals  Enc Vitals Group     BP 01/26/17 2354 138/79     Pulse Rate 01/26/17 2354 62     Resp 01/26/17 2354 18     Temp 01/26/17 2354 (!) 97.5 F (36.4 C)     Temp Source 01/26/17 2354 Oral     SpO2 01/26/17 2354 99 %     Weight 01/26/17 2355 255 lb (115.7 kg)     Height --      Head Circumference --      Peak Flow --      Pain Score 01/27/17 0537 1     Pain Loc --      Pain Edu? --      Excl. in [place]? --     Constitutional: Alert and oriented. Well appearing and in moderate distress. Eyes: Conjunctivae are normal. PERRL. EOMI. Head: Atraumatic. Nose: No congestion/rhinnorhea. Mouth/Throat: Mucous membranes are moist.  Oropharynx non-erythematous. Cardiovascular: Normal rate, regular rhythm. Grossly normal heart sounds.  Good peripheral circulation. Respiratory: Normal respiratory effort.  No retractions. Lungs CTAB. Gastrointestinal: Soft with some mid abd tenderness to palpation . No distention. Positive bowel sounds Musculoskeletal: No lower extremity tenderness nor edema.   Neurologic:  Normal speech and language.  Skin:  Skin is warm, dry and intact.  Psychiatric: Mood and affect are normal.   ____________________________________________   LABS (all labs ordered are listed, but only abnormal results are displayed)  Labs Reviewed  COMPREHENSIVE METABOLIC PANEL - Abnormal; Notable for the following:       Result Value   Sodium 133 (*)    Potassium 3.4 (*)    Chloride 93 (*)    Glucose, Bld 128 (*)    AST 52 (*)    All other components within normal limits  CBC - Abnormal; Notable for the following:    RDW 16.1 (*)    All other components within normal limits  URINALYSIS, COMPLETE (UACMP) WITH MICROSCOPIC - Abnormal; Notable for the following:    Color, Urine YELLOW (*)     APPearance CLEAR (*)    Hgb urine dipstick MODERATE (*)    Squamous Epithelial / LPF 0-5 (*)    All other components within normal limits  LIPASE, BLOOD   ____________________________________________  EKG  none ____________________________________________  RADIOLOGY  Ct Abdomen Pelvis W Contrast  Result Date: 01/27/2017 CLINICAL DATA:  71 year old female with abdominal pain. EXAM: CT ABDOMEN AND PELVIS WITH CONTRAST TECHNIQUE: Multidetector CT imaging of the abdomen and pelvis was performed using the standard protocol following bolus administration of intravenous contrast. CONTRAST:  142mL ISOVUE-300 IOPAMIDOL (ISOVUE-300) INJECTION 61% COMPARISON:  Ultrasound dated 01/12/2017 and CT of the abdomen pelvis dated 12/11/2016 FINDINGS: Lower chest: There are bibasilar linear atelectasis/ scarring as well as small scattered pneumatoceles. There is mild cardiomegaly. Partially visualized pericardial  effusion measuring up to 14 mm similar to the prior CT. Correlation with echocardiogram recommended. No intra-abdominal free air or free fluid. Hepatobiliary: Probable mild fatty infiltration of the liver. No intrahepatic biliary ductal dilatation. The gallbladder is unremarkable. Pancreas: Unremarkable. No pancreatic ductal dilatation or surrounding inflammatory changes. Spleen: Normal in size without focal abnormality. Adrenals/Urinary Tract: Adrenal glands are unremarkable. Kidneys are normal, without renal calculi, focal lesion, or hydronephrosis. Bladder is unremarkable. Stomach/Bowel: There is severe diverticulosis of the entire colon. Multiple duodenal diverticula noted measuring up to approximately 5 cm. No evidence of active inflammation. There is no bowel obstruction. Normal appendix. Vascular/Lymphatic: The abdominal aorta and IVC appear unremarkable. No portal venous gas identified. There is no adenopathy. Reproductive: Enlarged myomatous uterus. The ovaries appear unremarkable. Other: Small fat  containing umbilical hernia. Musculoskeletal: Mild degenerative changes of the spine. No acute osseous pathology. IMPRESSION: 1. No acute intra-abdominal or pelvic pathology. 2. Severe pancolonic diverticulosis and large duodenal diverticulum without active inflammatory changes. No bowel obstruction. Normal appendix. 3. Enlarged myomatous uterus. 4. Mild cardiomegaly with partially visualized pericardial effusion. Correlation with clinical exam and further evaluation with echocardiogram recommended. Electronically Signed   By: Anner Crete M.D.   On: 01/27/2017 04:52    ____________________________________________   PROCEDURES  Procedure(s) performed: None  Procedures  Critical Care performed: No  ____________________________________________   INITIAL IMPRESSION / ASSESSMENT AND PLAN / ED COURSE  Pertinent labs & imaging results that were available during my care of the patient were reviewed by me and considered in my medical decision making (see chart for details).  This is a 71 year old female who comes into the hospital today with some abdominal pain. The patient has been having this pain for multiple weeks but she reports it is worse today and she is also having some vomiting. I did give the patient a dose of morphine and Zofran. I sent the patient for a CT scan. The patient's CT scan is unremarkable and blood work is unremarkable. I will reassess the patient and shortness she is able to take by mouth.     The patient's CT scan is unremarkable. I attempted a by mouth trial and the patient after having her receive some Zofran. The patient started dry heaving. I then gave the patient some Reglan and she vomited again. Given the fact that the patient is continuing to have emesis I will admit the patient to the hospital for abdominal pain and intractable vomiting. ____________________________________________   FINAL CLINICAL IMPRESSION(S) / ED DIAGNOSES  Final diagnoses:    Abdominal pain, unspecified abdominal location  Intractable vomiting with nausea, unspecified vomiting type      NEW MEDICATIONS STARTED DURING THIS VISIT:  New Prescriptions   No medications on file     Note:  This document was prepared using Dragon voice recognition software and may include unintentional dictation errors.    Loney Hering, MD 01/27/17 [phone]

## 2017-01-27 NOTE — ED Notes (Signed)
Family and pt denies further needs.

## 2017-01-27 NOTE — Progress Notes (Signed)
Spoke with Dr.Willis about patient's nausea and vomiting, request for phenergan given. Also request to switch po hydralazine to iv hydralazine due to patietn's vomiting. Dr. Jannifer Franklin verbalized that he would put in some new orders. Taylor Johnson

## 2017-01-27 NOTE — Consult Note (Signed)
SURGICAL CONSULTATION NOTE (initial) - cpt: [id number]  HISTORY OF PRESENT ILLNESS (HPI):  71 y.o. female presented to [facility] ED with persistence of what she describes as lower abdominal pain that she identifies as worst at her suprapubic region x several weeks and for which she was recently diagnosed with a UTI. Patient states she eats a lot of sausage, pork chops, and collard greens, but denies any RUQ or epigastric abdominal pain. Patient's medical record states patient also previously described vomiting every time she eats or drinks anything (though she has not yet vomited at all since she was admitted today). During recent ED presentation on 6/28, abdominal ultrasound visualized cholelithiasis without cholecystitis, and CT Abdomen and Pelvis was performed in the ED today. Patient was also recently evaluated by Dr. Hampton Abbot in our office (surgery), who concluded it's unclear whether cholelithiasis could be causing the abdominal pain she described at that time, though advised patient a trial of low-fat diet that it does not seem patient has attempted.  Surgery is consulted by medical physician Dr. Manuella Ghazi in this context for evaluation and management of cholelithiasis.  PAST MEDICAL HISTORY (PMH):  Past Medical History:  Diagnosis Date  . Anxiety   . Anxiety, generalized 05/11/2015  . Asthma   . Cough 06/02/2016   Chronic - followed by Pulmonary  . Depression   . Developmental delay   . Diverticulitis   . Diverticulosis of colon 05/11/2015  . Dysphagia 10/15/2015   Routine Ba Swallow normal; rec modified barium swallow   . Hypoxia 09/12/2015  . Leg swelling   . Localized edema 04/15/2016  . Microscopic hematuria 04/15/2016  . OSA on CPAP 11/18/2015   CPAP @ 18 cm H2O started 02/2016  . Urge incontinence of urine 05/12/2015     PAST SURGICAL HISTORY [facility]):  Past Surgical History:  Procedure Laterality Date  . ABDOMINAL HYSTERECTOMY    . COLON SURGERY       MEDICATIONS:  Prior to Admission  medications   Medication Sig Start Date End Date Taking? Authorizing Provider  docusate sodium (COLACE) 100 MG capsule Take 1 capsule (100 mg total) by mouth daily as needed. 01/12/17 01/12/18 Yes Earleen Newport, MD  HYDROcodone-acetaminophen (NORCO) 5-325 MG tablet Take 1 tablet by mouth every 6 (six) hours as needed for moderate pain. 01/20/17  Yes Piscoya, Jacqulyn Bath, MD  omeprazole (PRILOSEC) 20 MG capsule Take 1 capsule (20 mg total) by mouth 2 (two) times daily. 01/12/17 01/12/18 Yes Earleen Newport, MD  ciprofloxacin (CIPRO) 250 MG tablet Take 1 tablet (250 mg total) by mouth 2 (two) times daily. Patient not taking: Reported on 01/27/2017 01/11/17   [name], MD     ALLERGIES:  No Known Allergies   SOCIAL HISTORY:  Social History   Social History  . Marital status: Single    Spouse name: N/A  . Number of children: N/A  . Years of education: N/A   Occupational History  . Not on file.   Social History Main Topics  . Smoking status: Never Smoker  . Smokeless tobacco: Never Used  . Alcohol use No  . Drug use: No  . Sexual activity: No   Other Topics Concern  . Not on file   Social History Narrative  . No narrative on file    The patient currently resides (home / rehab facility / nursing home): Home  The patient normally is (ambulatory / bedbound): Ambulatory   FAMILY HISTORY:  Family History  Problem Relation Age of Onset  .  Hypertension Mother   . Kidney disease Mother   . Bladder Cancer Neg Hx     REVIEW OF SYSTEMS:  Constitutional: denies weight loss, fever, chills, or sweats  Eyes: denies any other vision changes, history of eye injury  ENT: denies sore throat, hearing problems  Respiratory: denies shortness of breath, wheezing  Cardiovascular: denies chest pain, palpitations  Gastrointestinal: abdominal pain, N/V, and bowel function as per HPI Genitourinary: denies burning with urination or urinary frequency Musculoskeletal: denies any other joint  pains or cramps  Skin: denies any other rashes or skin discolorations  Neurological: denies any other headache, dizziness, weakness  Psychiatric: denies any other depression, anxiety   All other review of systems were negative   VITAL SIGNS:  Temp:  [97.5 F (36.4 C)-98.3 F (36.8 C)] 98.3 F (36.8 C) (07/13 1043) Pulse Rate:  [55-65] 60 (07/13 1043) Resp:  [17-24] 18 (07/13 1000) BP: (138-192)/(56-87) 160/64 (07/13 1043) SpO2:  [71 %-100 %] 71 % (07/13 1043) Weight:  [255 lb (115.7 kg)] 255 lb (115.7 kg) (07/12 2355)     Height: 5\' 4"  (162.6 cm) Weight: 255 lb (115.7 kg) BMI (Calculated): 43.9   INTAKE/OUTPUT:  This shift: Total I/O In: 435 [I.V.:435] Out: 75 [Emesis/NG output:75]  Last 2 shifts: @IOLAST2SHIFTS @   PHYSICAL EXAM:  Constitutional:  -- Obese body habitus  -- Awake, alert, and oriented x3, though intermittently repeated the same questions just answered Eyes:  -- Pupils equally round and reactive to light  -- No scleral icterus  Ear, nose, and throat:  -- No jugular venous distension  Pulmonary:  -- No crackles  -- Equal breath sounds bilaterally -- Breathing non-labored at rest Cardiovascular:  -- S1, S2 present  -- No pericardial rubs Gastrointestinal:  -- Abdomen soft and nondistended with mild-moderate suprapubic abdominal tenderness to palpation with absolutely no RUQ or epigastric tenderness to even deep palpation, no guarding/rebound  -- No abdominal masses appreciated, pulsatile or otherwise  Musculoskeletal and Integumentary:  -- Wounds or skin discoloration: None appreciated -- Extremities: B/L UE and LE FROM, hands and feet warm Neurologic:  -- Motor function: intact and symmetric -- Sensation: intact and symmetric  Labs:  CBC Latest Ref Rng & Units 01/27/2017 01/26/2017 01/12/2017  WBC 3.6 - 11.0 K/uL 8.2 8.2 6.7  Hemoglobin 12.0 - 16.0 g/dL 12.8 13.0 12.8  Hematocrit 35.0 - 47.0 % 38.8 39.8 39.5  Platelets 150 - 440 K/uL 205 228 245    CMP Latest Ref Rng & Units 01/27/2017 01/26/2017 01/12/2017  Glucose 65 - 99 mg/dL - 128(H) 109(H)  BUN 6 - 20 mg/dL - 13 14  Creatinine 0.44 - 1.00 mg/dL 0.95 0.85 0.96  Sodium 135 - 145 mmol/L - 133(L) 137  Potassium 3.5 - 5.1 mmol/L - 3.4(L) 3.4(L)  Chloride 101 - 111 mmol/L - 93(L) 103  CO2 22 - 32 mmol/L - 29 28  Calcium 8.9 - 10.3 mg/dL - 9.6 9.5  Total Protein 6.5 - 8.1 g/dL - 7.8 8.3(H)  Total Bilirubin 0.3 - 1.2 mg/dL - 0.8 0.5  Alkaline Phos 38 - 126 U/L - 84 88  AST 15 - 41 U/L - 52(H) 14(L)  ALT 14 - 54 U/L - 42 10(L)    Imaging studies:  Limited RUQ Abdominal Ultrasound (01/12/2017) Gallstone present measuring up to 6.7 mm. Negative sonographic Murphy. Normal wall thickness.  Common bile duct diameter measures 6.8 mm (borderline enlarged)  CT Abdomen and Pelvis with Contrast (01/27/2017) 1. No acute intra-abdominal or pelvic pathology.  2. Severe pancolonic diverticulosis and large duodenal diverticulum without active inflammatory changes. No bowel obstruction. Normal appendix. 3. Enlarged myomatous uterus. 4. Mild cardiomegaly with partially visualized pericardial effusion. Correlation with clinical exam and further evaluation with echocardiogram recommended.  Assessment/Plan: (ICD-10's: K46.20) 71 y.o. female with currently suprapubic abdominal pain of unclear etiology, though recently diagnosed with UTI, along with what currently appears to be asymptomatic cholelithiasis and extensive pan-colonic diverticulosis without diverticulitis, complicated by pertinent comorbidities including morbid obesity (BMI 65), developmental delay, COPD, OSA on CPAP qhs, dysphasia, generalized anxiety disorder, and major depression disorder.   - currently no indication for any surgical intervention  - if patient develops RUQ or epigastric pain after eating fatty foods (meats, cheeses/dairy, fried foods), then avoid/minimize those foods, preferring instead grains, vegetables, and  fruits  - avoid/minimize constipation (keep hydrated and eat a high fiber diet) +/- stool softener prn  - medical management of comorbidities per primary medical team  - please call if any surgical questions or concerns  - DVT prophylaxis  All of the above findings and recommendations were discussed with the patient and her RN, and all of patient's questions were answered to her expressed satisfaction.  Thank you for the opportunity to participate in this patient's care.   -- Marilynne Drivers Rosana Hoes, MD, [place]: [facility] - Partnering for exceptional care. Office: [phone]

## 2017-01-27 NOTE — ED Notes (Signed)
Patient given water and encouraged to drink for PO challenge per MD Dahlia Client request.  Pt became nauseated, and dry heaved into emesis bag approx 2 minutes after taking her first sip of water.

## 2017-01-27 NOTE — ED Notes (Signed)
Patient transported to CT 

## 2017-01-27 NOTE — ED Notes (Signed)
Report to Jack, RN  

## 2017-01-27 NOTE — H&P (Signed)
[facility] [place] NAME: Taylor Johnson    MR#:  [id number]  DATE OF BIRTH:  Sep 22, 1945  DATE OF ADMISSION:  01/27/2017  PRIMARY CARE PHYSICIAN: [name], MD   REQUESTING/REFERRING PHYSICIAN: Loney Hering, MD  CHIEF COMPLAINT:   Chief Complaint  Patient presents with  . Abdominal Pain  . Emesis    HISTORY OF PRESENT ILLNESS:  Taylor Johnson  is a 71 y.o. female with a known history of anxiety,depression,asthma is being admitted for abdominal pain, nausea/vomiting. She's been going back and forth to different doctors about it for a couple of weeks. The patient's sister who was at bedside provided most history, reports that she was diagnosed with a UTI and saw her primary care physician. She came into the emergency department on 6/28 with some abdominal pain after that episode and was told she had gallstones. The patient had follow-up with the surgeon Dr Hampton Abbot who told her he wasn't sure her pain was due to gallstones. She continues to have vomiting every time she eats/drinks anything per sister. The patient was having severe pain yesterday so she asked her sister to call 911, but her sister decided to bring driver her to ED. She had f/up scheduled with surgeon on coming Monday. The patient rates her pain a 9 out of 10 in intensity but is sleepy.  PAST MEDICAL HISTORY:   Past Medical History:  Diagnosis Date  . Anxiety   . Anxiety, generalized 05/11/2015  . Asthma   . Cough 06/02/2016   Chronic - followed by Pulmonary  . Depression   . Developmental delay   . Diverticulitis   . Diverticulosis of colon 05/11/2015  . Dysphagia 10/15/2015   Routine Ba Swallow normal; rec modified barium swallow   . Hypoxia 09/12/2015  . Leg swelling   . Localized edema 04/15/2016  . Microscopic hematuria 04/15/2016  . OSA on CPAP 11/18/2015   CPAP @ 18 cm H2O started 02/2016  . Urge incontinence of urine 05/12/2015   PAST SURGICAL HISTORY:    Past Surgical History:  Procedure Laterality Date  . ABDOMINAL HYSTERECTOMY    . COLON SURGERY      SOCIAL HISTORY:   Social History  Substance Use Topics  . Smoking status: Never Smoker  . Smokeless tobacco: Never Used  . Alcohol use No    FAMILY HISTORY:   Family History  Problem Relation Age of Onset  . Hypertension Mother   . Kidney disease Mother   . Bladder Cancer Neg Hx   Sister with hypertension and gallstone disease Father died of lung cancer DRUG ALLERGIES:  No Known Allergies REVIEW OF SYSTEMS:   Review of Systems  Constitutional: Negative for chills, fever and weight loss.  HENT: Negative for nosebleeds and sore throat.   Eyes: Negative for blurred vision.  Respiratory: Negative for cough, shortness of breath and wheezing.   Cardiovascular: Negative for chest pain, orthopnea, leg swelling and PND.  Gastrointestinal: Positive for abdominal pain, nausea and vomiting. Negative for constipation, diarrhea and heartburn.  Genitourinary: Negative for dysuria and urgency.  Musculoskeletal: Negative for back pain.  Skin: Negative for rash.  Neurological: Negative for dizziness, speech change, focal weakness and headaches.  Endo/Heme/Allergies: Does not bruise/bleed easily.  Psychiatric/Behavioral: Negative for depression.   MEDICATIONS AT HOME:   Prior to Admission medications   Medication Sig Start Date End Date Taking? Authorizing Provider  docusate sodium (COLACE) 100 MG capsule Take 1 capsule (100  mg total) by mouth daily as needed. 01/12/17 01/12/18 Yes Earleen Newport, MD  HYDROcodone-acetaminophen (NORCO) 5-325 MG tablet Take 1 tablet by mouth every 6 (six) hours as needed for moderate pain. 01/20/17  Yes Piscoya, Jacqulyn Bath, MD  omeprazole (PRILOSEC) 20 MG capsule Take 1 capsule (20 mg total) by mouth 2 (two) times daily. 01/12/17 01/12/18 Yes Earleen Newport, MD  ciprofloxacin (CIPRO) 250 MG tablet Take 1 tablet (250 mg total) by mouth 2 (two) times  daily. Patient not taking: Reported on 01/27/2017 01/11/17   [name], MD      VITAL SIGNS:  Blood pressure (!) 160/64, pulse 60, temperature 98.3 F (36.8 C), temperature source Oral, resp. rate 18, weight 115.7 kg (255 lb), SpO2 (!) 71 %. PHYSICAL EXAMINATION:  Physical Exam  Constitutional: She is oriented to person, place, and time and well-developed, well-nourished, and in no distress.  HENT:  Head: Normocephalic and atraumatic.  Eyes: Pupils are equal, round, and reactive to light. Conjunctivae and EOM are normal.  Neck: Normal range of motion. Neck supple. No tracheal deviation present. No thyromegaly present.  Cardiovascular: Normal rate, regular rhythm and normal heart sounds.   Pulmonary/Chest: Effort normal and breath sounds normal. No respiratory distress. She has no wheezes. She exhibits no tenderness.  Abdominal: Soft. Bowel sounds are normal. She exhibits no distension. There is generalized tenderness.  Musculoskeletal: Normal range of motion.  Neurological: She is alert and oriented to person, place, and time. No cranial nerve deficit.  Skin: Skin is warm and dry. No rash noted.  Psychiatric: Mood and affect normal.   LABORATORY PANEL:   CBC  Recent Labs Lab 01/27/17 1103  WBC 8.2  HGB 12.8  HCT 38.8  PLT 205   ------------------------------------------------------------------------------------------------------------------  Chemistries   Recent Labs Lab 01/26/17 2353 01/27/17 1103  NA 133*  --   K 3.4*  --   CL 93*  --   CO2 29  --   GLUCOSE 128*  --   BUN 13  --   CREATININE 0.85 0.95  CALCIUM 9.6  --   AST 52*  --   ALT 42  --   ALKPHOS 84  --   BILITOT 0.8  --    ------------------------------------------------------------------------------------------------------------------  Cardiac Enzymes No results for input(s): TROPONINI in the last 168  hours. ------------------------------------------------------------------------------------------------------------------  RADIOLOGY:  Ct Abdomen Pelvis W Contrast  Result Date: 01/27/2017 CLINICAL DATA:  71 year old female with abdominal pain. EXAM: CT ABDOMEN AND PELVIS WITH CONTRAST TECHNIQUE: Multidetector CT imaging of the abdomen and pelvis was performed using the standard protocol following bolus administration of intravenous contrast. CONTRAST:  176mL ISOVUE-300 IOPAMIDOL (ISOVUE-300) INJECTION 61% COMPARISON:  Ultrasound dated 01/12/2017 and CT of the abdomen pelvis dated 12/11/2016 FINDINGS: Lower chest: There are bibasilar linear atelectasis/ scarring as well as small scattered pneumatoceles. There is mild cardiomegaly. Partially visualized pericardial effusion measuring up to 14 mm similar to the prior CT. Correlation with echocardiogram recommended. No intra-abdominal free air or free fluid. Hepatobiliary: Probable mild fatty infiltration of the liver. No intrahepatic biliary ductal dilatation. The gallbladder is unremarkable. Pancreas: Unremarkable. No pancreatic ductal dilatation or surrounding inflammatory changes. Spleen: Normal in size without focal abnormality. Adrenals/Urinary Tract: Adrenal glands are unremarkable. Kidneys are normal, without renal calculi, focal lesion, or hydronephrosis. Bladder is unremarkable. Stomach/Bowel: There is severe diverticulosis of the entire colon. Multiple duodenal diverticula noted measuring up to approximately 5 cm. No evidence of active inflammation. There is no bowel obstruction. Normal appendix. Vascular/Lymphatic: The abdominal  aorta and IVC appear unremarkable. No portal venous gas identified. There is no adenopathy. Reproductive: Enlarged myomatous uterus. The ovaries appear unremarkable. Other: Small fat containing umbilical hernia. Musculoskeletal: Mild degenerative changes of the spine. No acute osseous pathology. IMPRESSION: 1. No acute  intra-abdominal or pelvic pathology. 2. Severe pancolonic diverticulosis and large duodenal diverticulum without active inflammatory changes. No bowel obstruction. Normal appendix. 3. Enlarged myomatous uterus. 4. Mild cardiomegaly with partially visualized pericardial effusion. Correlation with clinical exam and further evaluation with echocardiogram recommended. Electronically Signed   By: Anner Crete M.D.   On: 01/27/2017 04:52   IMPRESSION AND PLAN:  71 year old female with known history of anxiety, depression, asthma is being admitted for abdominal pain/nausea/vomiting  * Abdominal pain/nausea/vomiting -CT scan of the abdomen and pelvis does not show any acute pathology -We will get GI consultation for further evaluation -surgical consult  -Symptomatic management for now  *Hypokalemia -Replete and recheck  *Hypertension -Start hydralazine for better blood pressure control  *Anxiety/depression Not on any medications    All the records are reviewed and case discussed with ED provider. Management plans discussed with the patient, family (sister at bedside) and they are in agreement.  CODE STATUS: FULL CODE  TOTAL TIME TAKING CARE OF THIS PATIENT: 45 minutes.    [name] M.D on 01/27/2017 at 1:02 PM  Between 7am to 6pm - Pager - [phone]  After 6pm go to [email or web address] - Proofreader  [facility]  Office  [phone]  CC: Primary care physician; [name], MD   Note: This dictation was prepared with Dragon dictation along with smaller phrase technology. Any transcriptional errors that result from this process are unintentional.

## 2017-01-28 LAB — COMPREHENSIVE METABOLIC PANEL
ALT: 62 U/L — ABNORMAL HIGH (ref 14–54)
AST: 57 U/L — ABNORMAL HIGH (ref 15–41)
Albumin: 3.6 g/dL (ref 3.5–5.0)
Alkaline Phosphatase: 79 U/L (ref 38–126)
Anion gap: 8 (ref 5–15)
BUN: 12 mg/dL (ref 6–20)
CO2: 32 mmol/L (ref 22–32)
Calcium: 9.3 mg/dL (ref 8.9–10.3)
Chloride: 98 mmol/L — ABNORMAL LOW (ref 101–111)
Creatinine, Ser: 0.97 mg/dL (ref 0.44–1.00)
GFR calc Af Amer: >60 mL/min (ref >60)
GFR calc non Af Amer: 58 mL/min — ABNORMAL LOW (ref >60)
Glucose, Bld: 106 mg/dL — ABNORMAL HIGH (ref 65–99)
Potassium: 3.5 mmol/L (ref 3.5–5.1)
Sodium: 138 mmol/L (ref 135–145)
Total Bilirubin: 0.8 mg/dL (ref 0.3–1.2)
Total Protein: 7.4 g/dL (ref 6.5–8.1)

## 2017-01-28 LAB — CBC
HCT: 41.6 % (ref 35.0–47.0)
Hemoglobin: 13.6 g/dL (ref 12.0–16.0)
MCH: 28 pg (ref 26.0–34.0)
MCHC: 32.6 g/dL (ref 32.0–36.0)
MCV: 85.8 fL (ref 80.0–100.0)
Platelets: 214 10*3/uL (ref 150–440)
RBC: 4.85 MIL/uL (ref 3.80–5.20)
RDW: 16 % — ABNORMAL HIGH (ref 11.5–14.5)
WBC: 8.5 10*3/uL (ref 3.6–11.0)

## 2017-01-28 LAB — GLUCOSE, CAPILLARY: Glucose-Capillary: 105 mg/dL — ABNORMAL HIGH (ref 65–99)

## 2017-01-28 LAB — SEDIMENTATION RATE: Sed Rate: 14 mm/hr (ref 0–30)

## 2017-01-28 MED ORDER — METOCLOPRAMIDE HCL 5 MG/ML IJ SOLN
10.0000 mg | Freq: Three times a day (TID) | INTRAMUSCULAR | Status: DC
  Administered 2017-01-28 – 2017-01-31 (×10): 10 mg via INTRAVENOUS
  Filled 2017-01-28 (×10): qty 2

## 2017-01-28 MED ORDER — PANTOPRAZOLE SODIUM 40 MG IV SOLR
40.0000 mg | Freq: Two times a day (BID) | INTRAVENOUS | Status: DC
  Administered 2017-01-28 – 2017-01-30 (×4): 40 mg via INTRAVENOUS
  Filled 2017-01-28 (×4): qty 40

## 2017-01-28 NOTE — Progress Notes (Signed)
[place] at [facility]                                                                                                                                                                                  Patient Demographics   Taylor Johnson, is a 71 y.o. female, DOB - 06-15-1946, YQM:[id number]  Admit date - 01/27/2017   Admitting Physician [name], MD  Outpatient Primary MD for the patient is [name], MD   LOS - 1  Subjective: Patient continues to complain of abdominal pain and nausea unable to eat.    Review of Systems:   CONSTITUTIONAL: No documented fever. No fatigue, weakness. No weight gain, no weight loss.  EYES: No blurry or double vision.  ENT: No tinnitus. No postnasal drip. No redness of the oropharynx.  RESPIRATORY: No cough, no wheeze, no hemoptysis. No dyspnea.  CARDIOVASCULAR: No chest pain. No orthopnea. No palpitations. No syncope.  GASTROINTESTINAL:  Positive nausea, no vomiting or diarrhea. Positive abdominal pain. No melena or hematochezia.  GENITOURINARY: No dysuria or hematuria.  ENDOCRINE: No polyuria or nocturia. No heat or cold intolerance.  HEMATOLOGY: No anemia. No bruising. No bleeding.  INTEGUMENTARY: No rashes. No lesions.  MUSCULOSKELETAL: No arthritis. No swelling. No gout.  NEUROLOGIC: No numbness, tingling, or ataxia. No seizure-type activity.  PSYCHIATRIC: No anxiety. No insomnia. No ADD.    Vitals:   Vitals:   01/27/17 2055 01/28/17 0506 01/28/17 1038 01/28/17 1042  BP: (!) 186/69 (!) 173/54 (!) 127/35 (!) 124/56  Pulse: (!) 56 74 77   Resp: 18 20 18    Temp: 98.9 F (37.2 C) 98.5 F (36.9 C) 98.3 F (36.8 C)   TempSrc: Oral Oral Oral   SpO2: 94% 94% 90%   Weight:      Height:        Wt Readings from Last 3 Encounters:  01/26/17 255 lb (115.7 kg)  01/20/17 255 lb 9.6 oz (115.9 kg)  01/12/17 271 lb (122.9 kg)     Intake/Output Summary (Last 24 hours) at 01/28/17 1252 Last data filed at  01/28/17 0644  Gross per 24 hour  Intake             1276 ml  Output             1275 ml  Net                1 ml    Physical Exam:   GENERAL: Pleasant-appearing in no apparent distress.  HEAD, EYES, EARS, NOSE AND THROAT: Atraumatic, normocephalic. Extraocular muscles are intact. Pupils equal and reactive to light. Sclerae anicteric. No conjunctival injection. No oro-pharyngeal erythema.  NECK: Supple. There is no jugular venous distention. No bruits, no lymphadenopathy, no thyromegaly.  HEART: Regular rate and rhythm,. No murmurs, no rubs, no clicks.  LUNGS: Clear to auscultation bilaterally. No rales or rhonchi. No wheezes.  ABDOMEN: Soft, flat, diffuse tenderness without guarding. Has good bowel sounds. No hepatosplenomegaly appreciated.  EXTREMITIES: No evidence of any cyanosis, clubbing, or peripheral edema.  +2 pedal and radial pulses bilaterally.  NEUROLOGIC: The patient is alert, awake, and oriented x3 with no focal motor or sensory deficits appreciated bilaterally.  SKIN: Moist and warm with no rashes appreciated.  Psych: Not anxious, depressed LN: No inguinal LN enlargement    Antibiotics   Anti-infectives    None      Medications   Scheduled Meds: . docusate sodium  100 mg Oral BID  . heparin  5,000 Units Subcutaneous Q8H  . hydrALAZINE  25 mg Oral Q8H  . pantoprazole  40 mg Oral Daily   Continuous Infusions: . sodium chloride 100 mL/hr at 01/28/17 0424   PRN Meds:.acetaminophen **OR** acetaminophen, bisacodyl, hydrALAZINE, HYDROcodone-acetaminophen, morphine injection, ondansetron **OR** ondansetron (ZOFRAN) IV, promethazine, traZODone   Data Review:   Micro Results No results found for this or any previous visit (from the past 240 hour(s)).  Radiology Reports Ct Abdomen Pelvis W Contrast  Result Date: 01/27/2017 CLINICAL DATA:  71 year old female with abdominal pain. EXAM: CT ABDOMEN AND PELVIS WITH CONTRAST TECHNIQUE: Multidetector CT imaging of the  abdomen and pelvis was performed using the standard protocol following bolus administration of intravenous contrast. CONTRAST:  162mL ISOVUE-300 IOPAMIDOL (ISOVUE-300) INJECTION 61% COMPARISON:  Ultrasound dated 01/12/2017 and CT of the abdomen pelvis dated 12/11/2016 FINDINGS: Lower chest: There are bibasilar linear atelectasis/ scarring as well as small scattered pneumatoceles. There is mild cardiomegaly. Partially visualized pericardial effusion measuring up to 14 mm similar to the prior CT. Correlation with echocardiogram recommended. No intra-abdominal free air or free fluid. Hepatobiliary: Probable mild fatty infiltration of the liver. No intrahepatic biliary ductal dilatation. The gallbladder is unremarkable. Pancreas: Unremarkable. No pancreatic ductal dilatation or surrounding inflammatory changes. Spleen: Normal in size without focal abnormality. Adrenals/Urinary Tract: Adrenal glands are unremarkable. Kidneys are normal, without renal calculi, focal lesion, or hydronephrosis. Bladder is unremarkable. Stomach/Bowel: There is severe diverticulosis of the entire colon. Multiple duodenal diverticula noted measuring up to approximately 5 cm. No evidence of active inflammation. There is no bowel obstruction. Normal appendix. Vascular/Lymphatic: The abdominal aorta and IVC appear unremarkable. No portal venous gas identified. There is no adenopathy. Reproductive: Enlarged myomatous uterus. The ovaries appear unremarkable. Other: Small fat containing umbilical hernia. Musculoskeletal: Mild degenerative changes of the spine. No acute osseous pathology. IMPRESSION: 1. No acute intra-abdominal or pelvic pathology. 2. Severe pancolonic diverticulosis and large duodenal diverticulum without active inflammatory changes. No bowel obstruction. Normal appendix. 3. Enlarged myomatous uterus. 4. Mild cardiomegaly with partially visualized pericardial effusion. Correlation with clinical exam and further evaluation with  echocardiogram recommended. Electronically Signed   By: Anner Crete M.D.   On: 01/27/2017 04:52   Dg Abd 2 Views  Result Date: 01/12/2017 CLINICAL DATA:  Right upper quadrant pain EXAM: ABDOMEN - 2 VIEW COMPARISON:  None. FINDINGS: The bowel gas pattern is normal. There is no evidence of free air. Calcified uterine fibroids are noted. IMPRESSION: Nonobstructive bowel gas pattern. Electronically Signed   By: Ulyses Jarred M.D.   On: 01/12/2017 20:16   US Abdomen Limited Ruq  Result Date: 01/12/2017 CLINICAL DATA:  Right upper quadrant abdominal pain EXAM: ULTRASOUND ABDOMEN  LIMITED RIGHT UPPER QUADRANT COMPARISON:  CT 12/11/2016 FINDINGS: Gallbladder: Gallstone present measuring up to 6.7 mm. Negative sonographic Murphy. Normal wall thickness. Common bile duct: Diameter: Borderline enlarged at 6.8 mm Liver: No focal lesion identified. Within normal limits in parenchymal echogenicity. IMPRESSION: 1. Cholelithiasis without sonographic evidence for acute cholecystitis 2. Borderline enlarged common bile duct Electronically Signed   By: Donavan Foil M.D.   On: 01/12/2017 21:17     CBC  Recent Labs Lab 01/26/17 2353 01/27/17 1103 01/28/17 0412  WBC 8.2 8.2 8.5  HGB 13.0 12.8 13.6  HCT 39.8 38.8 41.6  PLT 228 205 214  MCV 84.9 86.1 85.8  MCH 27.7 28.4 28.0  MCHC 32.6 32.9 32.6  RDW 16.1* 15.9* 16.0*    Chemistries   Recent Labs Lab 01/26/17 2353 01/27/17 1103 01/28/17 0412  NA 133*  --  138  K 3.4*  --  3.5  CL 93*  --  98*  CO2 29  --  32  GLUCOSE 128*  --  106*  BUN 13  --  12  CREATININE 0.85 0.95 0.97  CALCIUM 9.6  --  9.3  AST 52*  --  57*  ALT 42  --  62*  ALKPHOS 84  --  79  BILITOT 0.8  --  0.8   ------------------------------------------------------------------------------------------------------------------ estimated creatinine clearance is 67.4 mL/min (by C-G formula based on SCr of 0.97  mg/dL). ------------------------------------------------------------------------------------------------------------------ No results for input(s): HGBA1C in the last 72 hours. ------------------------------------------------------------------------------------------------------------------ No results for input(s): CHOL, HDL, LDLCALC, TRIG, CHOLHDL, LDLDIRECT in the last 72 hours. ------------------------------------------------------------------------------------------------------------------ No results for input(s): TSH, T4TOTAL, T3FREE, THYROIDAB in the last 72 hours.  Invalid input(s): FREET3 ------------------------------------------------------------------------------------------------------------------ No results for input(s): VITAMINB12, FOLATE, FERRITIN, TIBC, IRON, RETICCTPCT in the last 72 hours.  Coagulation profile No results for input(s): INR, PROTIME in the last 168 hours.  No results for input(s): DDIMER in the last 72 hours.  Cardiac Enzymes No results for input(s): CKMB, TROPONINI, MYOGLOBIN in the last 168 hours.  Invalid input(s): CK ------------------------------------------------------------------------------------------------------------------ Invalid input(s): Martha   Patient is a 71 year old with history of severe diverticulosis presents with abdominal pain  1. Abdominal pain Liver function tests normal, lipase normal CT scan of the abdomen shows large mouth diverticulosis Patient's symptoms may be due to uncomplicated diverticular disease Other differentials include peptic ulcer disease, duodenal ulcer Vascular abnormalities involving her GI tract I will try empiric Reglan I have discussed this with gastroenterology who will see the patient Seen by surgery they do not seem the gallbladder is the cause  2. History of asthma currently not on medication  3. GERD I will place her on IV PPIs  4. Miscellaneous Lovenox for DVT  prophylaxis      Code Status Orders        Start     Ordered   01/27/17 1046  Full code  Continuous     01/27/17 1045    Code Status History    Date Active Date Inactive Code Status Order ID Comments User Context   09/12/2015  8:38 AM 09/13/2015  3:57 PM Full Code [id number]  Bettey Costa, MD Inpatient           Consults  Gastroenterology, surgery  DVT Prophylaxis  Lovenox   Lab Results  Component Value Date   PLT 214 01/28/2017     Time Spent in minutes   35 minutes Greater than 50% of time spent in care coordination and counseling patient  regarding the condition and plan of care.   Dustin Flock M.D on 01/28/2017 at 12:52 PM  Between 7am to 6pm - Pager - [phone]  After 6pm go to [email or web address] - password [email or web address] [facility]   Office  [phone]

## 2017-01-28 NOTE — Consult Note (Signed)
Referring Provider: Dr. Posey Pronto Primary Care Physician:  [name], MD Primary Gastroenterologist:  Althia Forts  Reason for Consultation:  Abdominal pain; N/V  HPI: Taylor Johnson is a 71 y.o. female with a 2-3 month history of recurrent nausea and vomiting and abdominal pain. Abdominal pain is sharp diffuse and will last all day long. Has recurrent vomiting after one bite of food or liquid. She does not admit to trouble swallowing food/liquid. CT scan negative for acute abdominal abnormalities. Diffuse colonic diverticulosis noted without inflammation. 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 No bowel obstruction. TB, ALP, lipase normal on admit. AST 57, ALT 62.   Difficult to obtain a history from Taylor Johnson because she does not answer most of my questions. One of her sisters is in the room who provides almost all of the history. Has lost 18 pounds unintentionally in the last month. Patient reports severe abdominal pain right now but is resting comfortably in the bed in a semi-left lateral position. When I ask her to lie supine she stares across the room and does not acknowledge my request and her sister cannot get her to move either. She does loudly ask for food especially "I like greasy food." Normal barium swallow in March 2017. No known EGD in the past. Reportedly had diverticulosis on colonoscopy in 2015.   Past Medical History:  Diagnosis Date  . Anxiety   . Anxiety, generalized 05/11/2015  . Asthma   . Cough 06/02/2016   Chronic - followed by Pulmonary  . Depression   . Developmental delay   . Diverticulitis   . Diverticulosis of colon 05/11/2015  . Dysphagia 10/15/2015   Routine Ba Swallow normal; rec modified barium swallow   . Hypoxia 09/12/2015  . Leg swelling   . Localized edema 04/15/2016  . Microscopic hematuria 04/15/2016  . OSA on CPAP 11/18/2015   CPAP @ 18 cm H2O started 02/2016  . Urge incontinence of urine 05/12/2015    Past Surgical History:  Procedure  Laterality Date  . ABDOMINAL HYSTERECTOMY    . COLON SURGERY      Prior to Admission medications   Medication Sig Start Date End Date Taking? Authorizing Provider  docusate sodium (COLACE) 100 MG capsule Take 1 capsule (100 mg total) by mouth daily as needed. 01/12/17 01/12/18 Yes Earleen Newport, MD  HYDROcodone-acetaminophen (NORCO) 5-325 MG tablet Take 1 tablet by mouth every 6 (six) hours as needed for moderate pain. 01/20/17  Yes Piscoya, Jacqulyn Bath, MD  omeprazole (PRILOSEC) 20 MG capsule Take 1 capsule (20 mg total) by mouth 2 (two) times daily. 01/12/17 01/12/18 Yes Earleen Newport, MD  ciprofloxacin (CIPRO) 250 MG tablet Take 1 tablet (250 mg total) by mouth 2 (two) times daily. Patient not taking: Reported on 01/27/2017 01/11/17   [name], MD    Scheduled Meds: . docusate sodium  100 mg Oral BID  . heparin  5,000 Units Subcutaneous Q8H  . hydrALAZINE  25 mg Oral Q8H  . metoCLOPramide (REGLAN) injection  10 mg Intravenous Q8H  . pantoprazole (PROTONIX) IV  40 mg Intravenous Q12H   Continuous Infusions: . sodium chloride 100 mL/hr at 01/28/17 0424   PRN Meds:.acetaminophen **OR** acetaminophen, bisacodyl, hydrALAZINE, HYDROcodone-acetaminophen, morphine injection, ondansetron **OR** ondansetron (ZOFRAN) IV, promethazine, traZODone  Allergies as of 01/26/2017  . (No Known Allergies)    Family History  Problem Relation Age of Onset  . Hypertension Mother   . Kidney disease Mother   . Bladder Cancer Neg  Hx     Social History   Social History  . Marital status: Single    Spouse name: N/A  . Number of children: N/A  . Years of education: N/A   Occupational History  . Not on file.   Social History Main Topics  . Smoking status: Never Smoker  . Smokeless tobacco: Never Used  . Alcohol use No  . Drug use: No  . Sexual activity: No   Other Topics Concern  . Not on file   Social History Narrative  . No narrative on file    Review of Systems: Unable to  obtain from patient  Physical Exam: Vital signs: Vitals:   01/28/17 1038 01/28/17 1042  BP: (!) 127/35 (!) 124/56  Pulse: 77   Resp: 18   Temp: 98.3 F (36.8 C)    Last BM Date: 01/26/17 General:   Lethargic, elderly, minimally verbal, obese HEENT: anicteric sclera, poor dentition Lungs:  Clear throughout to auscultation.   No wheezes, crackles, or rhonchi. No acute distress. Heart:  Regular rate and rhythm; no murmurs, clicks, rubs,  or gallops. Abdomen: diffuse tenderness with guarding, soft, nondistended, +BS, obese  Rectal:  Deferred Ext: no edema  GI:  Lab Results:  Recent Labs  01/26/17 2353 01/27/17 1103 01/28/17 0412  WBC 8.2 8.2 8.5  HGB 13.0 12.8 13.6  HCT 39.8 38.8 41.6  PLT 228 205 214   BMET  Recent Labs  01/26/17 2353 01/27/17 1103 01/28/17 0412  NA 133*  --  138  K 3.4*  --  3.5  CL 93*  --  98*  CO2 29  --  32  GLUCOSE 128*  --  106*  BUN 13  --  12  CREATININE 0.85 0.95 0.97  CALCIUM 9.6  --  9.3   LFT  Recent Labs  01/28/17 0412  PROT 7.4  ALBUMIN 3.6  AST 57*  ALT 62*  ALKPHOS 79  BILITOT 0.8   PT/INR No results for input(s): LABPROT, INR in the last 72 hours.   Studies/Results: Ct Abdomen Pelvis W Contrast  Result Date: 01/27/2017 CLINICAL DATA:  71 year old female with abdominal pain. EXAM: CT ABDOMEN AND PELVIS WITH CONTRAST TECHNIQUE: Multidetector CT imaging of the abdomen and pelvis was performed using the standard protocol following bolus administration of intravenous contrast. CONTRAST:  145mL ISOVUE-300 IOPAMIDOL (ISOVUE-300) INJECTION 61% COMPARISON:  Ultrasound dated 01/12/2017 and CT of the abdomen pelvis dated 12/11/2016 FINDINGS: Lower chest: There are bibasilar linear atelectasis/ scarring as well as small scattered pneumatoceles. There is mild cardiomegaly. Partially visualized pericardial effusion measuring up to 14 mm similar to the prior CT. Correlation with echocardiogram recommended. No intra-abdominal free  air or free fluid. Hepatobiliary: Probable mild fatty infiltration of the liver. No intrahepatic biliary ductal dilatation. The gallbladder is unremarkable. Pancreas: Unremarkable. No pancreatic ductal dilatation or surrounding inflammatory changes. Spleen: Normal in size without focal abnormality. Adrenals/Urinary Tract: Adrenal glands are unremarkable. Kidneys are normal, without renal calculi, focal lesion, or hydronephrosis. Bladder is unremarkable. Stomach/Bowel: There is severe diverticulosis of the entire colon. Multiple duodenal diverticula noted measuring up to approximately 5 cm. No evidence of active inflammation. There is no bowel obstruction. Normal appendix. Vascular/Lymphatic: The abdominal aorta and IVC appear unremarkable. No portal venous gas identified. There is no adenopathy. Reproductive: Enlarged myomatous uterus. The ovaries appear unremarkable. Other: Small fat containing umbilical hernia. Musculoskeletal: Mild degenerative changes of the spine. No acute osseous pathology. IMPRESSION: 1. No acute intra-abdominal or pelvic pathology. 2. Severe pancolonic  diverticulosis and large duodenal diverticulum without active inflammatory changes. No bowel obstruction. Normal appendix. 3. Enlarged myomatous uterus. 4. Mild cardiomegaly with partially visualized pericardial effusion. Correlation with clinical exam and further evaluation with echocardiogram recommended. Electronically Signed   By: Anner Crete M.D.   On: 01/27/2017 04:52    Impression/Plan: Recurrent nausea and vomiting and abdominal pain with a chart history of difficulty swallowing solids/liquids with coughing (unable to obtain that history at this time but history limited for reasons stated above). She likely has oropharyngeal dysphagia causing her recurrnt vomiting and needs a speech path evaluation on Monday 01/30/17. Will do an EGD tomorrow to look for a gastric outlet obstruction, peptic ulcer, or malignancy in the upper GI  tract (less likely based on the recent CT scan). I do not think her large duodenal diverticulum is causing her N/V/abdominal pain. Ice chips today ok. EGD tomorrow morning. Continue IV PPI Q 12 hours. Continue supportive care.    LOS: 1 day   [place] C.  01/28/2017, 1:12 PM

## 2017-01-28 NOTE — Progress Notes (Signed)
Chaplain received page for patient wanting to speak with [facility]. [place] visited with patient, providing pastoral care, words of encouragement, and ministry of prayer as requested.     01/28/17 2210  Clinical Encounter Type  Visited With Patient  Visit Type Initial;Spiritual support  Referral From Nurse  Consult/Referral To Chaplain  Spiritual Encounters  Spiritual Needs Prayer

## 2017-01-29 ENCOUNTER — Encounter
Admission: EM | Disposition: A | Discharge status: Skilled Nursing Facility | Payer: Self-pay | Source: Home / Self Care | Attending: Internal Medicine

## 2017-01-29 ENCOUNTER — Inpatient Hospital Stay: Payer: Medicare Other | Admitting: Certified Registered Nurse Anesthetist

## 2017-01-29 DIAGNOSIS — R112 Nausea with vomiting, unspecified: Secondary | ICD-10-CM | POA: Diagnosis present

## 2017-01-29 HISTORY — DX: Nausea with vomiting, unspecified: R11.2

## 2017-01-29 HISTORY — PX: ESOPHAGOGASTRODUODENOSCOPY (EGD) WITH PROPOFOL: SHX5813

## 2017-01-29 LAB — BASIC METABOLIC PANEL
Anion gap: 7 (ref 5–15)
BUN: 18 mg/dL (ref 6–20)
CO2: 29 mmol/L (ref 22–32)
Calcium: 8.7 mg/dL — ABNORMAL LOW (ref 8.9–10.3)
Chloride: 102 mmol/L (ref 101–111)
Creatinine, Ser: 1.07 mg/dL — ABNORMAL HIGH (ref 0.44–1.00)
GFR calc Af Amer: 60 mL/min — ABNORMAL LOW (ref >60)
GFR calc non Af Amer: 51 mL/min — ABNORMAL LOW (ref >60)
Glucose, Bld: 91 mg/dL (ref 65–99)
Potassium: 3.4 mmol/L — ABNORMAL LOW (ref 3.5–5.1)
Sodium: 138 mmol/L (ref 135–145)

## 2017-01-29 LAB — CBC
HCT: 38.2 % (ref 35.0–47.0)
Hemoglobin: 12.3 g/dL (ref 12.0–16.0)
MCH: 27.8 pg (ref 26.0–34.0)
MCHC: 32.3 g/dL (ref 32.0–36.0)
MCV: 86.1 fL (ref 80.0–100.0)
Platelets: 212 10*3/uL (ref 150–440)
RBC: 4.44 MIL/uL (ref 3.80–5.20)
RDW: 16.5 % — ABNORMAL HIGH (ref 11.5–14.5)
WBC: 6.5 10*3/uL (ref 3.6–11.0)

## 2017-01-29 LAB — GLUCOSE, CAPILLARY: Glucose-Capillary: 80 mg/dL (ref 65–99)

## 2017-01-29 SURGERY — ESOPHAGOGASTRODUODENOSCOPY (EGD) WITH PROPOFOL
Anesthesia: General

## 2017-01-29 SURGERY — ESOPHAGOGASTRODUODENOSCOPY (EGD) WITH PROPOFOL
Anesthesia: Monitor Anesthesia Care

## 2017-01-29 MED ORDER — ONDANSETRON HCL 4 MG/2ML IJ SOLN: 4.0000 mg | Freq: Once | INTRAMUSCULAR | Status: DC | PRN

## 2017-01-29 MED ORDER — DEXAMETHASONE SODIUM PHOSPHATE 10 MG/ML IJ SOLN
INTRAMUSCULAR | Status: DC | PRN
  Administered 2017-01-29: 10 mg via INTRAVENOUS

## 2017-01-29 MED ORDER — POTASSIUM CHLORIDE CRYS ER 20 MEQ PO TBCR
20.0000 meq | EXTENDED_RELEASE_TABLET | Freq: Once | ORAL | Status: AC
  Administered 2017-01-29: 20 meq via ORAL
  Filled 2017-01-29: qty 1

## 2017-01-29 MED ORDER — SODIUM CHLORIDE 0.9 % IV SOLN: INTRAVENOUS | Status: DC

## 2017-01-29 MED ORDER — ONDANSETRON HCL 4 MG/2ML IJ SOLN
INTRAMUSCULAR | Status: DC | PRN
  Administered 2017-01-29: 4 mg via INTRAVENOUS

## 2017-01-29 MED ORDER — SUCCINYLCHOLINE CHLORIDE 20 MG/ML IJ SOLN
INTRAMUSCULAR | Status: DC | PRN
  Administered 2017-01-29: 120 mg via INTRAVENOUS

## 2017-01-29 MED ORDER — PROPOFOL 10 MG/ML IV BOLUS
INTRAVENOUS | Status: DC | PRN
  Administered 2017-01-29: 180 mg via INTRAVENOUS

## 2017-01-29 MED ORDER — LIDOCAINE HCL (PF) 2 % IJ SOLN
INTRAMUSCULAR | Status: AC
  Filled 2017-01-29: qty 2

## 2017-01-29 MED ORDER — FENTANYL CITRATE (PF) 100 MCG/2ML IJ SOLN
INTRAMUSCULAR | Status: AC
  Filled 2017-01-29: qty 2

## 2017-01-29 MED ORDER — PROPOFOL 10 MG/ML IV BOLUS
INTRAVENOUS | Status: AC
  Filled 2017-01-29: qty 20

## 2017-01-29 MED ORDER — GLYCOPYRROLATE 0.2 MG/ML IJ SOLN
INTRAMUSCULAR | Status: DC | PRN
  Administered 2017-01-29: 0.2 mg via INTRAVENOUS

## 2017-01-29 NOTE — Anesthesia Preprocedure Evaluation (Signed)
Anesthesia Evaluation  Patient identified by MRN, date of birth, ID band Patient confused    Reviewed: Allergy & Precautions, NPO status , Patient's Chart, lab work & pertinent test results  History of Anesthesia Complications Negative for: history of anesthetic complications  Airway Mallampati: IV       Dental  (+) Missing, Chipped   Pulmonary asthma , sleep apnea ,           Cardiovascular negative cardio ROS       Neuro/Psych Anxiety Depression    GI/Hepatic Neg liver ROS,   Endo/Other  Morbid obesity  Renal/GU Renal disease (UTI)     Musculoskeletal   Abdominal   Peds  Hematology negative hematology ROS (+)   Anesthesia Other Findings   Reproductive/Obstetrics                             Anesthesia Physical Anesthesia Plan  ASA: III and emergent  Anesthesia Plan: General   Post-op Pain Management:    Induction: Intravenous, Rapid sequence and Cricoid pressure planned  PONV Risk Score and Plan:   Airway Management Planned: Oral ETT  Additional Equipment:   Intra-op Plan:   Post-operative Plan:   Informed Consent: I have reviewed the patients History and Physical, chart, labs and discussed the procedure including the risks, benefits and alternatives for the proposed anesthesia with the patient or authorized representative who has indicated his/her understanding and acceptance.     Plan Discussed with:   Anesthesia Plan Comments:         Anesthesia Quick Evaluation

## 2017-01-29 NOTE — Anesthesia Postprocedure Evaluation (Signed)
Anesthesia Post Note  Patient: Taylor Johnson  Procedure(s) Performed: Procedure(s) (LRB): ESOPHAGOGASTRODUODENOSCOPY (EGD) WITH PROPOFOL (N/A)  Patient location during evaluation: PACU Anesthesia Type: General Level of consciousness: awake and alert Pain management: pain level controlled Vital Signs Assessment: post-procedure vital signs reviewed and stable Respiratory status: spontaneous breathing and respiratory function stable Cardiovascular status: stable Anesthetic complications: no     Last Vitals:  Vitals:   01/29/17 0754 01/29/17 0847  BP: (!) 143/63 (!) 145/62  Pulse: 76 93  Resp: 18 (!) 22  Temp: 36.9 C 36.9 C    Last Pain:  Vitals:   01/29/17 0754  TempSrc: Tympanic  PainSc:                  Taylor Johnson

## 2017-01-29 NOTE — Transfer of Care (Signed)
Immediate Anesthesia Transfer of Care Note  Patient: Taylor Johnson  Procedure(s) Performed: Procedure(s): ESOPHAGOGASTRODUODENOSCOPY (EGD) WITH PROPOFOL (N/A)  Patient Location: PACU  Anesthesia Type:General  Level of Consciousness: awake and alert   Airway & Oxygen Therapy: Patient Spontanous Breathing and Patient connected to nasal cannula oxygen  Post-op Assessment: Report given to RN and Post -op Vital signs reviewed and stable  Post vital signs: Reviewed and stable  Last Vitals:  Vitals:   01/29/17 0415 01/29/17 0754  BP: (!) 126/46 (!) 143/63  Pulse: 85 76  Resp: 18 18  Temp: 37.3 C 36.9 C    Last Pain:  Vitals:   01/29/17 0754  TempSrc: Tympanic  PainSc:       Patients Stated Pain Goal: 0 (64/68/03 2122)  Complications: No apparent anesthesia complications

## 2017-01-29 NOTE — Op Note (Signed)
[facility] Gastroenterology Patient Name: Taylor Johnson Procedure Date: 01/29/2017 7:45 AM MRN: [id number] Account #: [id number] Date of Birth: [birth year] Admit Type: Inpatient Age: 71 Room: [facility] ENDO ROOM 4 Gender: Female Note Status: Finalized Procedure:            Upper GI endoscopy Indications:          Generalized abdominal pain, Nausea with vomiting Providers:            Lear Ng, MD Referring MD:         Curlene Labrum (Referring MD) Medicines:            Propofol per Anesthesia, Monitored Anesthesia Care Complications:        No immediate complications. Procedure:            Pre-Anesthesia Assessment:                       - Prior to the procedure, a History and Physical was                        performed, and patient medications and allergies were                        reviewed. The patient's tolerance of previous                        anesthesia was also reviewed. The risks and benefits of                        the procedure and the sedation options and risks were                        discussed with the patient. All questions were                        answered, and informed consent was obtained. Prior                        Anticoagulants: The patient has taken no previous                        anticoagulant or antiplatelet agents. ASA Grade                        Assessment: III - A patient with severe systemic                        disease. After reviewing the risks and benefits, the                        patient was deemed in satisfactory condition to undergo                        the procedure.                       After obtaining informed consent, the endoscope was                        passed under direct vision. Throughout the procedure,  the patient's blood pressure, pulse, and oxygen                        saturations were monitored continuously. The Endoscope                        was  introduced through the mouth, and advanced to the                        second part of duodenum. The upper GI endoscopy was                        accomplished without difficulty. The patient tolerated                        the procedure well. Findings:      The examined esophagus was normal.      The Z-line was regular and was found 42 cm from the incisors.      The entire examined stomach was normal.      The cardia and gastric fundus were normal on retroflexion.      The examined duodenum was normal. Impression:           - Normal esophagus.                       - Z-line regular, 42 cm from the incisors.                       - Normal stomach.                       - Normal examined duodenum.                       - No specimens collected. Recommendation:       - Patient has a contact number available for                        emergencies. The signs and symptoms of potential                        delayed complications were discussed with the patient.                        Return to normal activities tomorrow. Written discharge                        instructions were provided to the patient.                       - NPO.                       - Perform a modified barium swallow during this                        admission.                       - Observe patient's clinical course. Procedure Code(s):    --- Professional ---  44920, Esophagogastroduodenoscopy, flexible, transoral;                        diagnostic, including collection of specimen(s) by                        brushing or washing, when performed (separate procedure) Diagnosis Code(s):    --- Professional ---                       R11.2, Nausea with vomiting, unspecified                       R10.84, Generalized abdominal pain CPT copyright 2016 [employer] Medical Association. All rights reserved. The codes documented in this report are preliminary and upon coder review may  be revised to meet  current compliance requirements. Lear Ng, MD 01/29/2017 8:41:38 AM This report has been signed electronically. Number of Addenda: 0 Note Initiated On: 01/29/2017 7:45 AM      [facility]

## 2017-01-29 NOTE — H&P (View-Only) (Signed)
Referring Provider: Dr. Posey Pronto Primary Care Physician:  [name], MD Primary Gastroenterologist:  Althia Forts  Reason for Consultation:  Abdominal pain; N/V  HPI: Taylor Johnson is a 71 y.o. female with a 2-3 month history of recurrent nausea and vomiting and abdominal pain. Abdominal pain is sharp diffuse and will last all day long. Has recurrent vomiting after one bite of food or liquid. She does not admit to trouble swallowing food/liquid. CT scan negative for acute abdominal abnormalities. Diffuse colonic diverticulosis noted without inflammation. 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 No bowel obstruction. TB, ALP, lipase normal on admit. AST 57, ALT 62.   Difficult to obtain a history from Taylor Johnson because she does not answer most of my questions. One of her sisters is in the room who provides almost all of the history. Has lost 18 pounds unintentionally in the last month. Patient reports severe abdominal pain right now but is resting comfortably in the bed in a semi-left lateral position. When I ask her to lie supine she stares across the room and does not acknowledge my request and her sister cannot get her to move either. She does loudly ask for food especially "I like greasy food." Normal barium swallow in March 2017. No known EGD in the past. Reportedly had diverticulosis on colonoscopy in 2015.   Past Medical History:  Diagnosis Date  . Anxiety   . Anxiety, generalized 05/11/2015  . Asthma   . Cough 06/02/2016   Chronic - followed by Pulmonary  . Depression   . Developmental delay   . Diverticulitis   . Diverticulosis of colon 05/11/2015  . Dysphagia 10/15/2015   Routine Ba Swallow normal; rec modified barium swallow   . Hypoxia 09/12/2015  . Leg swelling   . Localized edema 04/15/2016  . Microscopic hematuria 04/15/2016  . OSA on CPAP 11/18/2015   CPAP @ 18 cm H2O started 02/2016  . Urge incontinence of urine 05/12/2015    Past Surgical History:  Procedure  Laterality Date  . ABDOMINAL HYSTERECTOMY    . COLON SURGERY      Prior to Admission medications   Medication Sig Start Date End Date Taking? Authorizing Provider  docusate sodium (COLACE) 100 MG capsule Take 1 capsule (100 mg total) by mouth daily as needed. 01/12/17 01/12/18 Yes Earleen Newport, MD  HYDROcodone-acetaminophen (NORCO) 5-325 MG tablet Take 1 tablet by mouth every 6 (six) hours as needed for moderate pain. 01/20/17  Yes Piscoya, Jacqulyn Bath, MD  omeprazole (PRILOSEC) 20 MG capsule Take 1 capsule (20 mg total) by mouth 2 (two) times daily. 01/12/17 01/12/18 Yes Earleen Newport, MD  ciprofloxacin (CIPRO) 250 MG tablet Take 1 tablet (250 mg total) by mouth 2 (two) times daily. Patient not taking: Reported on 01/27/2017 01/11/17   [name], MD    Scheduled Meds: . docusate sodium  100 mg Oral BID  . heparin  5,000 Units Subcutaneous Q8H  . hydrALAZINE  25 mg Oral Q8H  . metoCLOPramide (REGLAN) injection  10 mg Intravenous Q8H  . pantoprazole (PROTONIX) IV  40 mg Intravenous Q12H   Continuous Infusions: . sodium chloride 100 mL/hr at 01/28/17 0424   PRN Meds:.acetaminophen **OR** acetaminophen, bisacodyl, hydrALAZINE, HYDROcodone-acetaminophen, morphine injection, ondansetron **OR** ondansetron (ZOFRAN) IV, promethazine, traZODone  Allergies as of 01/26/2017  . (No Known Allergies)    Family History  Problem Relation Age of Onset  . Hypertension Mother   . Kidney disease Mother   . Bladder Cancer Neg  Hx     Social History   Social History  . Marital status: Single    Spouse name: N/A  . Number of children: N/A  . Years of education: N/A   Occupational History  . Not on file.   Social History Main Topics  . Smoking status: Never Smoker  . Smokeless tobacco: Never Used  . Alcohol use No  . Drug use: No  . Sexual activity: No   Other Topics Concern  . Not on file   Social History Narrative  . No narrative on file    Review of Systems: Unable to  obtain from patient  Physical Exam: Vital signs: Vitals:   01/28/17 1038 01/28/17 1042  BP: (!) 127/35 (!) 124/56  Pulse: 77   Resp: 18   Temp: 98.3 F (36.8 C)    Last BM Date: 01/26/17 General:   Lethargic, elderly, minimally verbal, obese HEENT: anicteric sclera, poor dentition Lungs:  Clear throughout to auscultation.   No wheezes, crackles, or rhonchi. No acute distress. Heart:  Regular rate and rhythm; no murmurs, clicks, rubs,  or gallops. Abdomen: diffuse tenderness with guarding, soft, nondistended, +BS, obese  Rectal:  Deferred Ext: no edema  GI:  Lab Results:  Recent Labs  01/26/17 2353 01/27/17 1103 01/28/17 0412  WBC 8.2 8.2 8.5  HGB 13.0 12.8 13.6  HCT 39.8 38.8 41.6  PLT 228 205 214   BMET  Recent Labs  01/26/17 2353 01/27/17 1103 01/28/17 0412  NA 133*  --  138  K 3.4*  --  3.5  CL 93*  --  98*  CO2 29  --  32  GLUCOSE 128*  --  106*  BUN 13  --  12  CREATININE 0.85 0.95 0.97  CALCIUM 9.6  --  9.3   LFT  Recent Labs  01/28/17 0412  PROT 7.4  ALBUMIN 3.6  AST 57*  ALT 62*  ALKPHOS 79  BILITOT 0.8   PT/INR No results for input(s): LABPROT, INR in the last 72 hours.   Studies/Results: Ct Abdomen Pelvis W Contrast  Result Date: 01/27/2017 CLINICAL DATA:  71 year old female with abdominal pain. EXAM: CT ABDOMEN AND PELVIS WITH CONTRAST TECHNIQUE: Multidetector CT imaging of the abdomen and pelvis was performed using the standard protocol following bolus administration of intravenous contrast. CONTRAST:  125mL ISOVUE-300 IOPAMIDOL (ISOVUE-300) INJECTION 61% COMPARISON:  Ultrasound dated 01/12/2017 and CT of the abdomen pelvis dated 12/11/2016 FINDINGS: Lower chest: There are bibasilar linear atelectasis/ scarring as well as small scattered pneumatoceles. There is mild cardiomegaly. Partially visualized pericardial effusion measuring up to 14 mm similar to the prior CT. Correlation with echocardiogram recommended. No intra-abdominal free  air or free fluid. Hepatobiliary: Probable mild fatty infiltration of the liver. No intrahepatic biliary ductal dilatation. The gallbladder is unremarkable. Pancreas: Unremarkable. No pancreatic ductal dilatation or surrounding inflammatory changes. Spleen: Normal in size without focal abnormality. Adrenals/Urinary Tract: Adrenal glands are unremarkable. Kidneys are normal, without renal calculi, focal lesion, or hydronephrosis. Bladder is unremarkable. Stomach/Bowel: There is severe diverticulosis of the entire colon. Multiple duodenal diverticula noted measuring up to approximately 5 cm. No evidence of active inflammation. There is no bowel obstruction. Normal appendix. Vascular/Lymphatic: The abdominal aorta and IVC appear unremarkable. No portal venous gas identified. There is no adenopathy. Reproductive: Enlarged myomatous uterus. The ovaries appear unremarkable. Other: Small fat containing umbilical hernia. Musculoskeletal: Mild degenerative changes of the spine. No acute osseous pathology. IMPRESSION: 1. No acute intra-abdominal or pelvic pathology. 2. Severe pancolonic  diverticulosis and large duodenal diverticulum without active inflammatory changes. No bowel obstruction. Normal appendix. 3. Enlarged myomatous uterus. 4. Mild cardiomegaly with partially visualized pericardial effusion. Correlation with clinical exam and further evaluation with echocardiogram recommended. Electronically Signed   By: Anner Crete M.D.   On: 01/27/2017 04:52    Impression/Plan: Recurrent nausea and vomiting and abdominal pain with a chart history of difficulty swallowing solids/liquids with coughing (unable to obtain that history at this time but history limited for reasons stated above). She likely has oropharyngeal dysphagia causing her recurrnt vomiting and needs a speech path evaluation on Monday 01/30/17. Will do an EGD tomorrow to look for a gastric outlet obstruction, peptic ulcer, or malignancy in the upper GI  tract (less likely based on the recent CT scan). I do not think her large duodenal diverticulum is causing her N/V/abdominal pain. Ice chips today ok. EGD tomorrow morning. Continue IV PPI Q 12 hours. Continue supportive care.    LOS: 1 day   [place] C.  01/28/2017, 1:12 PM

## 2017-01-29 NOTE — Anesthesia Post-op Follow-up Note (Cosign Needed)
Anesthesia QCDR form completed.        

## 2017-01-29 NOTE — Progress Notes (Signed)
[facility] - [place] [facility]                                                                                                                                                                                  Patient Demographics   Taylor Johnson, is a 71 y.o. female, DOB - 1946/02/12, XIP:[id number]  Admit date - 01/27/2017   Admitting Physician [name], MD  Outpatient Primary MD for the patient is [name], MD   LOS - 2  Subjective: Patient had her EGD which is normal GI recommends speech eval and a barium study She wants to eat right now    Review of Systems:   CONSTITUTIONAL: No documented fever. No fatigue, weakness. No weight gain, no weight loss.  EYES: No blurry or double vision.  ENT: No tinnitus. No postnasal drip. No redness of the oropharynx.  RESPIRATORY: No cough, no wheeze, no hemoptysis. No dyspnea.  CARDIOVASCULAR: No chest pain. No orthopnea. No palpitations. No syncope.  GASTROINTESTINAL:  Currently no nausea, no vomiting or diarrhea. No abdominal pain. No melena or hematochezia.  GENITOURINARY: No dysuria or hematuria.  ENDOCRINE: No polyuria or nocturia. No heat or cold intolerance.  HEMATOLOGY: No anemia. No bruising. No bleeding.  INTEGUMENTARY: No rashes. No lesions.  MUSCULOSKELETAL: No arthritis. No swelling. No gout.  NEUROLOGIC: No numbness, tingling, or ataxia. No seizure-type activity.  PSYCHIATRIC: No anxiety. No insomnia. No ADD.    Vitals:   Vitals:   01/29/17 0847 01/29/17 0901 01/29/17 0911 01/29/17 0932  BP: (!) 145/62 (!) 163/68 (!) 161/71 (!) 176/59  Pulse: 93 92 90 84  Resp: (!) 22 16 18    Temp: 98.4 F (36.9 C)  98.7 F (37.1 C) 98.6 F (37 C)  TempSrc:    Oral  SpO2: 95% 98% 98% 99%  Weight:      Height:        Wt Readings from Last 3 Encounters:  01/29/17 264 lb 11.2 oz (120.1 kg)  01/20/17 255 lb 9.6 oz (115.9 kg)  01/12/17 271 lb (122.9 kg)     Intake/Output Summary (Last 24 hours) at  01/29/17 1247 Last data filed at 01/29/17 1238  Gross per 24 hour  Intake          3666.23 ml  Output              750 ml  Net          2916.23 ml    Physical Exam:   GENERAL: Pleasant-appearing in no apparent distress.  HEAD, EYES, EARS, NOSE AND THROAT: Atraumatic, normocephalic. Extraocular muscles are intact. Pupils equal and reactive to light. Sclerae anicteric. No conjunctival  injection. No oro-pharyngeal erythema.  NECK: Supple. There is no jugular venous distention. No bruits, no lymphadenopathy, no thyromegaly.  HEART: Regular rate and rhythm,. No murmurs, no rubs, no clicks.  LUNGS: Clear to auscultation bilaterally. No rales or rhonchi. No wheezes.  ABDOMEN: Soft, flat, diffuse tenderness without guarding. Has good bowel sounds. No hepatosplenomegaly appreciated.  EXTREMITIES: No evidence of any cyanosis, clubbing, or peripheral edema.  +2 pedal and radial pulses bilaterally.  NEUROLOGIC: The patient is alert, awake, and oriented x3 with no focal motor or sensory deficits appreciated bilaterally.  SKIN: Moist and warm with no rashes appreciated.  Psych: Not anxious, depressed LN: No inguinal LN enlargement    Antibiotics   Anti-infectives    None      Medications   Scheduled Meds: . docusate sodium  100 mg Oral BID  . heparin  5,000 Units Subcutaneous Q8H  . hydrALAZINE  25 mg Oral Q8H  . metoCLOPramide (REGLAN) injection  10 mg Intravenous Q8H  . pantoprazole (PROTONIX) IV  40 mg Intravenous Q12H  . potassium chloride  20 mEq Oral Once   Continuous Infusions: . sodium chloride 100 mL/hr at 01/29/17 0251   PRN Meds:.acetaminophen **OR** acetaminophen, bisacodyl, hydrALAZINE, HYDROcodone-acetaminophen, morphine injection, ondansetron **OR** ondansetron (ZOFRAN) IV, promethazine, traZODone   Data Review:   Micro Results No results found for this or any previous visit (from the past 240 hour(s)).  Radiology Reports Ct Abdomen Pelvis W Contrast  Result  Date: 01/27/2017 CLINICAL DATA:  71 year old female with abdominal pain. EXAM: CT ABDOMEN AND PELVIS WITH CONTRAST TECHNIQUE: Multidetector CT imaging of the abdomen and pelvis was performed using the standard protocol following bolus administration of intravenous contrast. CONTRAST:  133mL ISOVUE-300 IOPAMIDOL (ISOVUE-300) INJECTION 61% COMPARISON:  Ultrasound dated 01/12/2017 and CT of the abdomen pelvis dated 12/11/2016 FINDINGS: Lower chest: There are bibasilar linear atelectasis/ scarring as well as small scattered pneumatoceles. There is mild cardiomegaly. Partially visualized pericardial effusion measuring up to 14 mm similar to the prior CT. Correlation with echocardiogram recommended. No intra-abdominal free air or free fluid. Hepatobiliary: Probable mild fatty infiltration of the liver. No intrahepatic biliary ductal dilatation. The gallbladder is unremarkable. Pancreas: Unremarkable. No pancreatic ductal dilatation or surrounding inflammatory changes. Spleen: Normal in size without focal abnormality. Adrenals/Urinary Tract: Adrenal glands are unremarkable. Kidneys are normal, without renal calculi, focal lesion, or hydronephrosis. Bladder is unremarkable. Stomach/Bowel: There is severe diverticulosis of the entire colon. Multiple duodenal diverticula noted measuring up to approximately 5 cm. No evidence of active inflammation. There is no bowel obstruction. Normal appendix. Vascular/Lymphatic: The abdominal aorta and IVC appear unremarkable. No portal venous gas identified. There is no adenopathy. Reproductive: Enlarged myomatous uterus. The ovaries appear unremarkable. Other: Small fat containing umbilical hernia. Musculoskeletal: Mild degenerative changes of the spine. No acute osseous pathology. IMPRESSION: 1. No acute intra-abdominal or pelvic pathology. 2. Severe pancolonic diverticulosis and large duodenal diverticulum without active inflammatory changes. No bowel obstruction. Normal appendix. 3.  Enlarged myomatous uterus. 4. Mild cardiomegaly with partially visualized pericardial effusion. Correlation with clinical exam and further evaluation with echocardiogram recommended. Electronically Signed   By: Anner Crete M.D.   On: 01/27/2017 04:52   Dg Abd 2 Views  Result Date: 01/12/2017 CLINICAL DATA:  Right upper quadrant pain EXAM: ABDOMEN - 2 VIEW COMPARISON:  None. FINDINGS: The bowel gas pattern is normal. There is no evidence of free air. Calcified uterine fibroids are noted. IMPRESSION: Nonobstructive bowel gas pattern. Electronically Signed   By: Cletus Gash.D.  On: 01/12/2017 20:16   US Abdomen Limited Ruq  Result Date: 01/12/2017 CLINICAL DATA:  Right upper quadrant abdominal pain EXAM: ULTRASOUND ABDOMEN LIMITED RIGHT UPPER QUADRANT COMPARISON:  CT 12/11/2016 FINDINGS: Gallbladder: Gallstone present measuring up to 6.7 mm. Negative sonographic Murphy. Normal wall thickness. Common bile duct: Diameter: Borderline enlarged at 6.8 mm Liver: No focal lesion identified. Within normal limits in parenchymal echogenicity. IMPRESSION: 1. Cholelithiasis without sonographic evidence for acute cholecystitis 2. Borderline enlarged common bile duct Electronically Signed   By: Donavan Foil M.D.   On: 01/12/2017 21:17     CBC  Recent Labs Lab 01/26/17 2353 01/27/17 1103 01/28/17 0412 01/29/17 0420  WBC 8.2 8.2 8.5 6.5  HGB 13.0 12.8 13.6 12.3  HCT 39.8 38.8 41.6 38.2  PLT 228 205 214 212  MCV 84.9 86.1 85.8 86.1  MCH 27.7 28.4 28.0 27.8  MCHC 32.6 32.9 32.6 32.3  RDW 16.1* 15.9* 16.0* 16.5*    Chemistries   Recent Labs Lab 01/26/17 2353 01/27/17 1103 01/28/17 0412 01/29/17 0420  NA 133*  --  138 138  K 3.4*  --  3.5 3.4*  CL 93*  --  98* 102  CO2 29  --  32 29  GLUCOSE 128*  --  106* 91  BUN 13  --  12 18  CREATININE 0.85 0.95 0.97 1.07*  CALCIUM 9.6  --  9.3 8.7*  AST 52*  --  57*  --   ALT 42  --  62*  --   ALKPHOS 84  --  79  --   BILITOT 0.8  --  0.8  --     ------------------------------------------------------------------------------------------------------------------ estimated creatinine clearance is 62.5 mL/min (A) (by C-G formula based on SCr of 1.07 mg/dL (H)). ------------------------------------------------------------------------------------------------------------------ No results for input(s): HGBA1C in the last 72 hours. ------------------------------------------------------------------------------------------------------------------ No results for input(s): CHOL, HDL, LDLCALC, TRIG, CHOLHDL, LDLDIRECT in the last 72 hours. ------------------------------------------------------------------------------------------------------------------ No results for input(s): TSH, T4TOTAL, T3FREE, THYROIDAB in the last 72 hours.  Invalid input(s): FREET3 ------------------------------------------------------------------------------------------------------------------ No results for input(s): VITAMINB12, FOLATE, FERRITIN, TIBC, IRON, RETICCTPCT in the last 72 hours.  Coagulation profile No results for input(s): INR, PROTIME in the last 168 hours.  No results for input(s): DDIMER in the last 72 hours.  Cardiac Enzymes No results for input(s): CKMB, TROPONINI, MYOGLOBIN in the last 168 hours.  Invalid input(s): CK ------------------------------------------------------------------------------------------------------------------ Invalid input(s): [place]   Patient is a 71 year old with history of severe diverticulosis presents with abdominal pain  1. Abdominal pain Liver function tests normal, lipase normal CT scan of the abdomen shows large mouth diverticulosis Patient's symptoms may be due to uncomplicated diverticular disease EGD shows no significant abnormality Vascular abnormalities involving her GI tract Continue empiric Reglan Speech eval Seen by surgery they do not seem the gallbladder is the cause  2.  History of asthma currently not on medication  3. GERD I will place her on IV PPIs  4. Miscellaneous Lovenox for DVT prophylaxis      Code Status Orders        Start     Ordered   01/27/17 1046  Full code  Continuous     01/27/17 1045    Code Status History    Date Active Date Inactive Code Status Order ID Comments User Context   09/12/2015  8:38 AM 09/13/2015  3:57 PM Full Code [id number]  Bettey Costa, MD Inpatient           Consults  Gastroenterology, surgery  DVT Prophylaxis  Lovenox   Lab Results  Component Value Date   PLT 212 01/29/2017     Time Spent in minutes   35 minutes Greater than 50% of time spent in care coordination and counseling patient regarding the condition and plan of care.   Dustin Flock M.D on 01/29/2017 at 12:47 PM  Between 7am to 6pm - Pager - [phone]  After 6pm go to [email or web address] - password [email or web address] [facility]   Office  [phone]

## 2017-01-29 NOTE — Brief Op Note (Signed)
Normal EGD. Would recommend a speech pathology with a modified barium swallow tomorrow for evaluation for oropharyngeal dysphagia. D/W sister [name]. Will defer to primary team to consult speech path and ordering of modified barium swallow. Dr. Vicente Males from GI will f/u tomorrow.

## 2017-01-29 NOTE — Anesthesia Procedure Notes (Signed)
Procedure Name: Intubation Performed by: Demetrius Charity Pre-anesthesia Checklist: Patient identified, Patient being monitored, Timeout performed, Emergency Drugs available and Suction available Patient Re-evaluated:Patient Re-evaluated prior to induction Oxygen Delivery Method: Circle system utilized Preoxygenation: Pre-oxygenation with 100% oxygen Induction Type: IV induction, Cricoid Pressure applied and Rapid sequence Laryngoscope Size: 3 and McGraph Grade View: Grade II Tube type: Oral Tube size: 7.0 mm Number of attempts: 1 Airway Equipment and Method: Video-laryngoscopy Placement Confirmation: ETT inserted through vocal cords under direct vision,  positive ETCO2 and breath sounds checked- equal and bilateral Secured at: 21 cm Tube secured with: Tape Dental Injury: Teeth and Oropharynx as per pre-operative assessment  Difficulty Due To: Difficult Airway- due to large tongue and Difficult Airway- due to reduced neck mobility

## 2017-01-29 NOTE — Interval H&P Note (Signed)
History and Physical Interval Note:  01/29/2017 8:21 AM  Taylor Johnson  has presented today for surgery, with the diagnosis of N&V abdominal pain  The various methods of treatment have been discussed with the patient and family. After consideration of risks, benefits and other options for treatment, the patient has consented to  Procedure(s): ESOPHAGOGASTRODUODENOSCOPY (EGD) WITH PROPOFOL (N/A) as a surgical intervention .  The patient's history has been reviewed, patient examined, no change in status, stable for surgery.  I have reviewed the patient's chart and labs.  Questions were answered to the patient's satisfaction.     Duval C.

## 2017-01-30 ENCOUNTER — Ambulatory Visit: Payer: Medicare Other | Admitting: Surgery

## 2017-01-30 ENCOUNTER — Encounter: Payer: Self-pay | Admitting: Gastroenterology

## 2017-01-30 DIAGNOSIS — R109 Unspecified abdominal pain: Secondary | ICD-10-CM

## 2017-01-30 DIAGNOSIS — R1310 Dysphagia, unspecified: Secondary | ICD-10-CM

## 2017-01-30 LAB — GLUCOSE, CAPILLARY: Glucose-Capillary: 109 mg/dL — ABNORMAL HIGH (ref 65–99)

## 2017-01-30 MED ORDER — SUCRALFATE 1 GM/10ML PO SUSP: 1.0000 g | Freq: Three times a day (TID) | ORAL | 0 refills | Status: DC

## 2017-01-30 MED ORDER — ORAL CARE MOUTH RINSE
15.0000 mL | Freq: Two times a day (BID) | OROMUCOSAL | Status: DC
Start: 2017-01-30 — End: 2017-02-02
  Administered 2017-01-30 – 2017-02-02 (×7): 15 mL via OROMUCOSAL

## 2017-01-30 MED ORDER — PANTOPRAZOLE SODIUM 40 MG PO TBEC
40.0000 mg | DELAYED_RELEASE_TABLET | Freq: Two times a day (BID) | ORAL | Status: DC
  Administered 2017-01-30 – 2017-02-02 (×6): 40 mg via ORAL
  Filled 2017-01-30 (×7): qty 1

## 2017-01-30 MED ORDER — METOCLOPRAMIDE HCL 5 MG PO TABS: 5.0000 mg | ORAL_TABLET | Freq: Three times a day (TID) | ORAL | 1 refills | Status: DC

## 2017-01-30 MED ORDER — SUCRALFATE 1 GM/10ML PO SUSP
1.0000 g | Freq: Three times a day (TID) | ORAL | Status: DC
  Administered 2017-01-30 – 2017-02-02 (×13): 1 g via ORAL
  Filled 2017-01-30 (×13): qty 10

## 2017-01-30 NOTE — NC FL2 (Signed)
[place] LEVEL OF CARE SCREENING TOOL     IDENTIFICATION  Patient Name: Taylor Johnson Birthdate: 1945-12-29 Sex: female Admission Date (Current Location): 01/27/2017  [facility] and [place] Number:  Engineering geologist and Address:  [facility], [address], [place], [place] [address]      Provider Number: [id number]  Attending Physician Name and Address:  Dustin Flock, MD  Relative Name and Phone Number:  [name],[name] Sister   [phone]     Current Level of Care: Hospital Recommended Level of Care: [place] Prior Approval Number:    Date Approved/Denied:   PASRR Number:  Pending  Discharge Plan: SNF    Current Diagnoses: Patient Active Problem List   Diagnosis Date Noted  . Nausea and vomiting 01/29/2017  . Abdominal pain 01/27/2017  . Gall stones 01/20/2017  . Cough 06/02/2016  . Localized edema 04/15/2016  . Microscopic hematuria 04/15/2016  . OSA on CPAP 11/18/2015  . Dysphagia 10/15/2015  . Hypoxia 09/12/2015  . Urge incontinence of urine 05/12/2015  . Diverticulosis of colon 05/11/2015  . Anxiety, generalized 05/11/2015    Orientation RESPIRATION BLADDER Height & Weight     Self, Time, Situation, Place  O2 (.5 L) Continent Weight: 267 lb 4.8 oz (121.2 kg) Height:  5\' 4"  (162.6 cm)  BEHAVIORAL SYMPTOMS/MOOD NEUROLOGICAL BOWEL NUTRITION STATUS      Continent Diet  AMBULATORY STATUS COMMUNICATION OF NEEDS Skin   Limited Assist Verbally Normal                       Personal Care Assistance Level of Assistance  Bathing, Feeding, Dressing Bathing Assistance: Limited assistance Feeding assistance: Independent Dressing Assistance: Limited assistance     Functional Limitations Info  Sight, Speech, Hearing Sight Info: Adequate Hearing Info: Adequate Speech Info: Adequate    SPECIAL CARE FACTORS FREQUENCY  PT (By licensed PT)     PT Frequency: 5x a week               Contractures Contractures Info: Not present    Additional Factors Info  Code Status, Allergies Code Status Info: Full Code Allergies Info: NKA           Current Medications (01/30/2017):  This is the current hospital active medication list Current Facility-Administered Medications  Medication Dose Route Frequency Provider Last Rate Last Dose  . acetaminophen (TYLENOL) tablet 650 mg  650 mg Oral Q6H PRN [name], MD       Or  . acetaminophen (TYLENOL) suppository 650 mg  650 mg Rectal Q6H PRN [name], MD      . bisacodyl (DULCOLAX) EC tablet 5 mg  5 mg Oral Daily PRN [name], MD      . docusate sodium (COLACE) capsule 100 mg  100 mg Oral BID [name], MD   100 mg at 01/30/17 0911  . heparin injection 5,000 Units  5,000 Units Subcutaneous Q8H [name], MD   5,000 Units at 01/30/17 1500  . hydrALAZINE (APRESOLINE) injection 10 mg  10 mg Intravenous Q4H PRN Lance Coon, MD   10 mg at 01/28/17 0933  . hydrALAZINE (APRESOLINE) tablet 25 mg  25 mg Oral Q8H Shah, Vipul, MD   25 mg at 01/30/17 1500  . HYDROcodone-acetaminophen (NORCO/VICODIN) 5-325 MG per tablet 1-2 tablet  1-2 tablet Oral Q4H PRN [name], MD   2 tablet at 01/28/17 0933  . MEDLINE mouth rinse  15 mL Mouth Rinse  BID Dustin Flock, MD   15 mL at 01/30/17 1500  . metoCLOPramide (REGLAN) injection 10 mg  10 mg Intravenous Q8H Dustin Flock, MD   10 mg at 01/30/17 1500  . morphine 2 MG/ML injection 2 mg  2 mg Intravenous Q4H PRN [name], MD   2 mg at 01/27/17 1755  . ondansetron (ZOFRAN) tablet 4 mg  4 mg Oral Q6H PRN [name], MD       Or  . ondansetron (ZOFRAN) injection 4 mg  4 mg Intravenous Q6H PRN [name], MD   4 mg at 01/30/17 0655  . pantoprazole (PROTONIX) EC tablet 40 mg  40 mg Oral BID AC Dustin Flock, MD      . promethazine (PHENERGAN) injection 12.5 mg  12.5 mg Intravenous Q6H PRN Lance Coon, MD   12.5 mg at 01/28/17 0933  . sucralfate (CARAFATE) 1 GM/10ML suspension 1 g   1 g Oral TID WC & HS [name], MD   1 g at 01/30/17 1207  . traZODone (DESYREL) tablet 25 mg  25 mg Oral QHS PRN [name], MD   25 mg at 01/27/17 2241     Discharge Medications: Please see discharge summary for a list of discharge medications.  Relevant Imaging Results:  Relevant Lab Results:   Additional Information SSN [id number]  Ross Ludwig, [place]

## 2017-01-30 NOTE — Progress Notes (Signed)

## 2017-01-30 NOTE — Progress Notes (Signed)
   [name] MD, MRCP(U.K) [address]  [place]  [place], [place] [address]  Main: Taylor Johnson is being followed for abdominal pain, dysphagia  Day 2 of follow up   Subjective: When I entered the room she had completed eating her breakfast tray. No issues with swallowing , denies any abdominal pain , says she has some discomfort in her chest    Objective: Vital signs in last 24 hours: Vitals:   01/29/17 1545 01/29/17 1548 01/29/17 2123 01/30/17 0551  BP: (!) 139/52  (!) 133/51 (!) 158/56  Pulse:   65 64  Resp:   18 18  Temp:   98.7 F (37.1 C) 98.9 F (37.2 C)  TempSrc:   Oral Oral  SpO2: 96% 92% 95% 97%  Weight:    267 lb 4.8 oz (121.2 kg)  Height:       Weight change: 2 lb 9.6 oz (1.179 kg)  Intake/Output Summary (Last 24 hours) at 01/30/17 6063 Last data filed at 01/30/17 0160  Gross per 24 hour  Intake          2938.33 ml  Output             1125 ml  Net          1813.33 ml     Exam: Heart:: Regular rate and rhythm, S1S2 present or without murmur or extra heart sounds Lungs: normal, clear to auscultation and clear to auscultation and percussion Abdomen: soft, nontender, normal bowel sounds   Lab Results: @LABTEST2 @ Micro Results: No results found for this or any previous visit (from the past 240 hour(s)). Studies/Results: No results found. Medications: I have reviewed the patient's current medications. Scheduled Meds: . docusate sodium  100 mg Oral BID  . heparin  5,000 Units Subcutaneous Q8H  . hydrALAZINE  25 mg Oral Q8H  . metoCLOPramide (REGLAN) injection  10 mg Intravenous Q8H  . pantoprazole (PROTONIX) IV  40 mg Intravenous Q12H   Continuous Infusions: . sodium chloride 100 mL/hr at 01/30/17 0655   PRN Meds:.acetaminophen **OR** acetaminophen, bisacodyl, hydrALAZINE, HYDROcodone-acetaminophen, morphine injection, ondansetron **OR** ondansetron (ZOFRAN) IV, promethazine, traZODone   Assessment: Active Problems:  Abdominal pain   Nausea and vomiting  Taylor Johnson 71 y.o. female admitted with nausea,vomiting,abdominal pain , dysphagia . EGD 01/29/17 was normal . CT abdomen 01/27/17 shows no abnormality in the abdomen to explain her symptoms. At this time she says her abdominal pain and swallowing have improved but complains of chest discomfort.   Plan: 1 . Modified barium swallow  2. OP gastric emptying study if symptoms recur  3. Reglan can continue for a few more days and then stop when eating and drinking well 4. Continue PPI 5. Family were asking me to find out if she could be transferred to a different hospital since " her symptoms persisted" ,I explained that since she denies any abdominal pain , no issues with swallowing wasn't sure if there was anything more I could help with from the GI point of view. I did explain that if she wishes to transfer to discuss with Dr Posey Pronto. The chest discomfort was pointed out at her neck , will add carafate  To treat any spasms/GERD.     LOS: 3 days   [name] 01/30/2017, 9:09 AM

## 2017-01-30 NOTE — Evaluation (Addendum)
Clinical/Bedside Swallow Evaluation Patient Details  Name: Taylor Johnson MRN: [id number] Date of Birth: [birth year]  Today's Date: 01/30/2017 Time: SLP Start Time (ACUTE ONLY): 1120 SLP Stop Time (ACUTE ONLY): 1220 SLP Time Calculation (min) (ACUTE ONLY): 60 min  Past Medical History:  Past Medical History:  Diagnosis Date  . Anxiety   . Anxiety, generalized 05/11/2015  . Asthma   . Cough 06/02/2016   Chronic - followed by Pulmonary  . Depression   . Developmental delay   . Diverticulitis   . Diverticulosis of colon 05/11/2015  . Dysphagia 10/15/2015   Routine Ba Swallow normal; rec modified barium swallow   . Hypoxia 09/12/2015  . Leg swelling   . Localized edema 04/15/2016  . Microscopic hematuria 04/15/2016  . OSA on CPAP 11/18/2015   CPAP @ 18 cm H2O started 02/2016  . Urge incontinence of urine 05/12/2015   Past Surgical History:  Past Surgical History:  Procedure Laterality Date  . ABDOMINAL HYSTERECTOMY    . COLON SURGERY    . ESOPHAGOGASTRODUODENOSCOPY (EGD) WITH PROPOFOL N/A 01/29/2017   Procedure: ESOPHAGOGASTRODUODENOSCOPY (EGD) WITH PROPOFOL;  Surgeon: Wilford Corner, MD;  Location: [facility] ENDOSCOPY;  Service: Endoscopy;  Laterality: N/A;   HPI:  Pt is a 71 y.o. female with a 2-3 month history of recurrent nausea and vomiting and abdominal pain. Abdominal pain is sharp diffuse and will last all day long. Has recurrent vomiting after few bites of food or liquid. She does not admit to trouble swallowing food/liquid. CT scan negative for acute abdominal abnormalities. Diffuse colonic diverticulosis noted without inflammation. 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 No bowel obstruction. One of her sisters is in the room who provides almost all of the history says pt has lost 18 pounds unintentionally in the last month. During some engagement w/ others, she stares across the room and does not acknowledge requests at times. She has loudly asked for food especially "I like  greasy food." Normal barium swallow in March 2017. No known EGD in the past. Reportedly had diverticulosis on colonoscopy in 2015. Pt had an EGD yesterday, 15th, which was normal per GI note. Pt has been on a clear liquid, and now Full Liquid, diet since EGD and has tolerated the liquids/foods well w/ NO overt s/s of aspiration per NSG and family report. Pt does verbalize minimally and follow general instruction.    Assessment / Plan / Recommendation Clinical Impression  Pt appears at baseline w/ no overt oropharyngeal phase swallowing deficits noted w/ oral intake; although, pt was only assessed w/ thin liquids and purees d/t GI restriction of a Full Liquid diet at this time post EGD yesterday. Pt consumed po trials of thin liquids(4+ ozs) and purees(4ozs) w/ no overt s/s of aspiration noted; no decline in respiratory status or change in vocal quality assessed immediate post trials and swallowing. Oral phase appeared wfl for bolus management and oral clearing. No GI issues were assessed or endorsed by pt; pt nodded everything was "fine" when asked how the food/drink felt going down(Esophageal) - no overt s/s of discomfort were assessed. Suspect pt could be experiencing ESOPHAGEAL PHASE dysmotility w/ regurgitation episodes; any issues of the ESOPHAGEAL PHASE can/will impact the oropharyngeal phases of swallowing and moreso the Desire for eating/drinking overall. Recommend f/u w/ GI for any further assessment of the ESOPHAGUS d/t pt's c/o GI issues. Recommend continued education and f/u w/ GI on Esophageal motility and any assessment warranted d/t the risk of regurgitation leading to aspiration of  REFLUX and potential Pulmonary decline. Recommend pills in Puree for easier Esophageal phase clearing. MD and NSG updated. Recommend broken down foods d/t pt's dentition status; monitoring during meals to lessen impulsiveness w/ oral intake d/t developmental delay/Cognitive status. SLP Visit Diagnosis: Dysphagia,  pharyngoesophageal phase (R13.14);Dysphagia, unspecified (R13.10) (Esophageal phase dysphagia)    Aspiration Risk  Mild aspiration risk (from regurgitated material; Reflux (of Esophagus))    Diet Recommendation  continue Full Liquid diet (thin consistency) as per GI at this time; upgrade to soft, moistened, broken down foods as she begins to take more solids - strict REFLUX precautions; diet.  Monitor for any impulsivity during eating/drinking.  Medication Administration: Whole meds with puree    Other  Recommendations Recommended Consults: Consider GI evaluation;Consider esophageal assessment (Dietician f/u) Oral Care Recommendations: Oral care BID;Patient independent with oral care;Staff/trained caregiver to provide oral care   Follow up Recommendations None      Frequency and Duration            Prognosis Prognosis for Safe Diet Advancement: Fair (-Good) Barriers to Reach Goals: Cognitive deficits;Behavior Barriers/Prognosis Comment: impulsiveness; insight into GI issues      Swallow Study   General Date of Onset: 01/27/17 HPI: Pt is a 71 y.o. female with a 2-3 month history of recurrent nausea and vomiting and abdominal pain. Abdominal pain is sharp diffuse and will last all day long. Has recurrent vomiting after few bites of food or liquid. She does not admit to trouble swallowing food/liquid. CT scan negative for acute abdominal abnormalities. Diffuse colonic diverticulosis noted without inflammation. 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 No bowel obstruction. One of her sisters is in the room who provides almost all of the history says pt has lost 18 pounds unintentionally in the last month. During some engagement w/ others, she stares across the room and does not acknowledge requests at times. She has loudly asked for food especially "I like greasy food." Normal barium swallow in March 2017. No known EGD in the past. Reportedly had diverticulosis on colonoscopy in 2015. Pt  had an EGD yesterday, 15th, which was normal per GI note. Pt has been on a clear liquid, and now Full Liquid, diet since EGD and has tolerated the liquids/foods well w/ NO overt s/s of aspiration per NSG and family report. Pt does verbalize minimally and follow general instruction.  Type of Study: Bedside Swallow Evaluation Previous Swallow Assessment: barium swallow study Diet Prior to this Study: Thin liquids (Full Liquid diet per GI) Temperature Spikes Noted: No (wbc 6.5) Respiratory Status: Nasal cannula (.5-1.0 liters) History of Recent Intubation: No Behavior/Cognition: Alert;Cooperative;Pleasant mood;Confused;Requires cueing;Distractible Oral Cavity Assessment: Within Functional Limits Oral Care Completed by SLP: Recent completion by staff Oral Cavity - Dentition: Missing dentition (several) Vision: Functional for self-feeding Self-Feeding Abilities: Able to feed self;Needs set up (monitoring d/t impulsiveness) Patient Positioning: Upright in bed Baseline Vocal Quality: Normal;Low vocal intensity Volitional Cough: Cognitively unable to elicit Volitional Swallow: Unable to elicit    Oral/Motor/Sensory Function Overall Oral Motor/Sensory Function: Within functional limits (w/ bolus management)   Ice Chips Ice chips: Not tested   Thin Liquid Thin Liquid: Within functional limits Presentation: Cup;Self Fed (~4-5 ozs total) Other Comments: needs monitoring to drink slowly/small sips d/t impulsiveness    Nectar Thick Nectar Thick Liquid: Not tested   Honey Thick Honey Thick Liquid: Not tested   Puree Puree: Within functional limits Presentation: Self Fed;Spoon (4 ozs)   Solid   GO   Solid: Not  tested Other Comments: d/t GI status currently(on a full liquid diet)    Functional Assessment Tool Used: clinical judgement Functional Limitations: Swallowing Swallow Current Status (Q2060): At least 1 percent but less than 20 percent impaired, limited or restricted Swallow Goal Status  [phone]): At least 1 percent but less than 20 percent impaired, limited or restricted Swallow Discharge Status [phone]): At least 1 percent but less than 20 percent impaired, limited or restricted    Orinda Kenner, MS, CCC-SLP Taylor Johnson 01/30/2017,2:01 PM

## 2017-01-30 NOTE — Clinical Social Work Placement (Signed)
   CLINICAL SOCIAL WORK PLACEMENT  NOTE  Date:  01/30/2017  Patient Details  Name: Taylor Johnson MRN: [id number] Date of Birth: [birth year]  Clinical Social Work is seeking post-discharge placement for this patient at the [place] level of care (*CSW will initial, date and re-position this form in  chart as items are completed):  Yes   Patient/family provided with [facility]'s list of facilities offering this level of care within the geographic area requested by the patient (or if unable, by the patient's family).  Yes   Patient/family informed of their freedom to choose among providers that offer the needed level of care, that participate in Medicare, Medicaid or managed care program needed by the patient, have an available bed and are willing to accept the patient.  Yes   Patient/family informed of [facility]'s ownership interest in [facility] and [facility], as well as of the fact that they are under no obligation to receive care at these facilities.  PASRR submitted to EDS on 01/30/17     PASRR number received on       Existing PASRR number confirmed on       FL2 transmitted to all facilities in geographic area requested by pt/family on 01/30/17     FL2 transmitted to all facilities within larger geographic area on       Patient informed that his/her managed care company has contracts with or will negotiate with certain facilities, including the following:        Yes   Patient/family informed of bed offers received.  Patient chooses bed at       Physician recommends and patient chooses bed at      Patient to be transferred to   on  .  Patient to be transferred to facility by       Patient family notified on   of transfer.  Name of family member notified:        PHYSICIAN Please sign FL2     Additional Comment:    _______________________________________________ Ross Ludwig, LCSWA 01/30/2017, 6:36  PM

## 2017-01-30 NOTE — Clinical Social Work Note (Addendum)
CSW spoke to patient and her family they would like to pursue SNF placement for short term rehab.  CSW was given permission to begin bed search in [place].  4:00 pm Patient would like [facility], CSW contacted [facility] and they are trying to verify insurance information.  CSW is also waiting on a [facility] number for patient, CSW attempted to contact [facility], and there was no answer, CSW left message awaiting for call back.  Jones Broom. [place], MSW, [place]  01/30/2017 5:50 PM

## 2017-01-30 NOTE — Evaluation (Signed)
Physical Therapy Evaluation Patient Details Name: Taylor Johnson MRN: [id number] DOB: 10-18-1945 Today's Date: 01/30/2017   History of Present Illness  Pt is a 71 y/o F who presented with 2-3 month h/o nausea, vomiting, and abdominal pain.  CT scan negative for acute abdominal abnormalities.  Diffuse colonic diverticulosis noted without inflammation.  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 Surgery did not feel that this was gallbladder related.  Patient was seen by GI and and underwent EGD which was normal.  No bowel obstruction.  Pt's PMH includes depression, developmental delay.    Clinical Impression  Pt admitted with above diagnosis. Pt currently with functional limitations due to the deficits listed below (see PT Problem List). Ms. Virella presents with impaired cognition (unsure if this is pt's baseline) and generalized weakness.  Three family members present in room during Evaluation who are all silent and do not offer any information but simply stare at pt and this therapist.  The pt requires min assist for transfers and each time sit>stand was attempted the pt reported dizziness and impulsively sits down.  Unable to take BP in standing as pt refuses to stand up long enough for othostatics to be completed.  BP seated after transfer BSC>bed 146/57.  BP seated EOB after standing 168/55.  SpO2 91% at it's lowest on RA throughout session.  No SOB noted.  Pt reports she is independent at baseline and provides some assist to her mother whom she lives with.  Given pt's current mobility status and lack of assist available at home, recommending SNF at d/c.  Pt will benefit from skilled PT to increase their independence and safety with mobility to allow discharge to the venue listed below.      Follow Up Recommendations Supervision/Assistance - 24 hour;SNF    Equipment Recommendations  Rolling walker with 5" wheels (Bariatric RW)    Recommendations for Other Services       Precautions /  Restrictions Precautions Precautions: Fall;Other (comment) Precaution Comments: Monitor vitals as pt reports dizziness with activity Restrictions Weight Bearing Restrictions: No      Mobility  Bed Mobility Overal bed mobility: Needs Assistance Bed Mobility: Supine to Sit;Sit to Supine     Supine to sit: Min guard;HOB elevated Sit to supine: Min guard   General bed mobility comments: Pt moves slowly and cautiously.  She reports dizziness with supine>sit.  She requries use of bed rails to pull herself up toward [facility] when repositioning in bed.  Transfers Overall transfer level: Needs assistance Equipment used: Rolling walker (2 wheeled);1 person hand held assist Transfers: Sit to/from [facility] Sit to Stand: Min assist Stand pivot transfers: Min assist       General transfer comment: Upon PT arrival pt reports she needs to void and was assisted to [facility] with 1 person HHA.  Pt demonstrates a flexed posture and requires cues to not sit prematurely.  Attempted sit>stand from bed x3 but each time pt reports dizziness once standing and sits down impulsively.  BP readings below.  Ambulation/Gait             General Gait Details: Pt refuses to attempt  Stairs            Wheelchair Mobility    Modified Rankin (Stroke Patients Only)       Balance Overall balance assessment: Needs assistance Sitting-balance support: Feet supported;Single extremity supported Sitting balance-Leahy Scale: Poor Sitting balance - Comments: Relies on at least 1UE support when sitting  Standing balance support: Single extremity supported;During functional activity Standing balance-Leahy Scale: Poor Standing balance comment: Relies on at least 1UE support for static and dynamic activities                             Pertinent Vitals/Pain Pain Assessment: Faces Faces Pain Scale: Hurts even more Pain Location: Pt reports abdominal pain Pain Descriptors /  Indicators: Discomfort;Grimacing Pain Intervention(s): Limited activity within patient's tolerance;Monitored during session    Home Living Family/patient expects to be discharged to:: Private residence Living Arrangements: Parent Available Help at Discharge: Family;Available PRN/intermittently Type of Home: House Home Access: Ramped entrance     Home Layout: One level Home Equipment: None Additional Comments: Pt is a very poor historian and has a difficult time answering questions about home layout and PLOF despite different methods asking pt these questions.      Prior Function Level of Independence: Needs assistance   Gait / Transfers Assistance Needed: Pt reports that she ambulates without AD and denies any falls.  She says she walks to the mailbox each day.  ADL's / Homemaking Assistance Needed: Pt reports she is independent with bathing, dressing, cooking.  She says that her mother is not healthy and that she cooks meals for her mother.  Pt has difficult time reporting any additional information regarding any additional assist her mom might require.          Hand Dominance        Extremity/Trunk Assessment   Upper Extremity Assessment Upper Extremity Assessment: Generalized weakness (BUE strength grossly 3+/5)    Lower Extremity Assessment Lower Extremity Assessment:  (BLE strength grossly 3+/5)    Cervical / Trunk Assessment Cervical / Trunk Assessment: Kyphotic  Communication   Communication: No difficulties  Cognition Arousal/Alertness: Awake/alert Behavior During Therapy: Anxious;Impulsive Overall Cognitive Status:  (see general comments)                                 General Comments: Per chart review pt with h/o developmental delay.  Family in room but offers no information regarding pt's cognitive status and remains silent throughout entire session, staring at pt and this therapist.  Pt has a very difficult time answers questions about  herself, PLOF, home layout.  She is impulsive with mobility.  She has a difficult time explaining to this therapist how she feels.      General Comments General comments (skin integrity, edema, etc.): Unable to take BP in standing as pt refuses to stand up long enough for othostatics to be completed.  BP seated after transfer BSC>bed 146/57.  BP seated EOB after standing 168/55.  SpO2 91% at it's lowest on RA throughout session.  No SOB noted.    Exercises     Assessment/Plan    PT Assessment Patient needs continued PT services  PT Problem List Decreased strength;Decreased activity tolerance;Decreased balance;Decreased cognition;Decreased knowledge of use of DME;Decreased safety awareness;Decreased knowledge of precautions;Obesity       PT Treatment Interventions DME instruction;Gait training;Functional mobility training;Therapeutic activities;Therapeutic exercise;Balance training;Neuromuscular re-education;Cognitive remediation;Patient/family education    PT Goals (Current goals can be found in the Care Plan section)  Acute Rehab PT Goals Patient Stated Goal: to feel better PT Goal Formulation: With patient Time For Goal Achievement: 02/13/17 Potential to Achieve Goals: Fair    Frequency Min 2X/week   Barriers to discharge Decreased caregiver support Pt  lives with her mother whom she cares for    Co-evaluation               AM-PAC PT "6 Clicks" Daily Activity  Outcome Measure Difficulty turning over in bed (including adjusting bedclothes, sheets and blankets)?: A Little Difficulty moving from lying on back to sitting on the side of the bed? : A Little Difficulty sitting down on and standing up from a chair with arms (e.g., wheelchair, bedside commode, etc,.)?: A Little Help needed moving to and from a bed to chair (including a wheelchair)?: A Little Help needed walking in hospital room?: A Lot Help needed climbing 3-5 steps with a railing? : A Lot 6 Click Score: 16     End of Session Equipment Utilized During Treatment: Gait belt Activity Tolerance: Other (comment) (limited due to c/o dizziness) Patient left: in bed;with call bell/phone within reach;with bed alarm set;Other (comment);with family/visitor present (in chair position) Nurse Communication: Mobility status;Other (comment) (SpO2, BP) PT Visit Diagnosis: Muscle weakness (generalized) (M62.81);Unsteadiness on feet (R26.81);Difficulty in walking, not elsewhere classified (R26.2)    Time: 7824-2353 PT Time Calculation (min) (ACUTE ONLY): 36 min   Charges:   PT Evaluation $PT Eval Low Complexity: 1 Procedure PT Treatments $Therapeutic Activity: 8-22 mins   PT G Codes:        Collie Siad PT, DPT 01/30/2017, 3:34 PM

## 2017-01-30 NOTE — Clinical Social Work Note (Signed)
Clinical Social Work Assessment  Patient Details  Name: Taylor Johnson MRN: [id number] Date of Birth: Mar 05, 1946  Date of referral:  01/30/17               Reason for consult:  Facility Placement                Permission sought to share information with:  Facility Sport and exercise psychologist, Family Supports Permission granted to share information::  Yes, Verbal Permission Granted  Name::     Taylor Johnson Sister   [phone]   Agency::  SNF admissions  Relationship::     Contact Information:     Housing/Transportation Living arrangements for the past 2 months:  [place] of Information:  Patient Patient Interpreter Needed:  None Criminal Activity/Legal Involvement Pertinent to Current Situation/Hospitalization:  No - Comment as needed Significant Relationships:  Siblings, Other Family Members Lives with:  Parents Do you feel safe going back to the place where you live?  No Need for family participation in patient care:  No (Coment)  Care giving concerns: Patient and family feel she needs some short term rehab before she is able to return back home.   Social Worker assessment / plan: Patient is a 71 year old female who is alert and oriented x4.  Patient lives with her mother and helps take care of her, patient has two sisters and a brother, patient receives disability.  Patient states she has not been to rehab before, CSW explained to patient what to expect at SNF and what the process is for getting approved for SNF placement.  Patient was explained how insurance will pay for the stay.  Patient expressed she would like to go to [facility], however CSW explained to her there are not any beds available and she will have to look at other SNFs.  Patient and her family have decided they would like to go to [facility].  Patient and her family gave CSW permission to begin bed search in [place].  Patient and family did not have any other questions or  concerns about going to SNF.  Employment status:  Disabled (Comment on whether or not currently receiving Disability) Insurance information:  Programmer, applications, Medicaid In [place] PT Recommendations:  [place] / Referral to community resources:  [place]  Patient/Family's Response to care:  Patient and family agreeable to going to SNF for short term rehab.  Patient/Family's Understanding of and Emotional Response to Diagnosis, Current Treatment, and Prognosis:  Patient expressed that she is hopeful that she will not have to be at SNF very long.  Emotional Assessment Appearance:  Appears stated age Attitude/Demeanor/Rapport:    Affect (typically observed):  Calm, Stable, Pleasant Orientation:  Oriented to Place, Oriented to Self, Oriented to  Time, Oriented to Situation Alcohol / Substance use:  Not Applicable Psych involvement (Current and /or in the community):  No (Comment)  Discharge Needs  Concerns to be addressed:  Lack of Support Readmission within the last 30 days:  No Current discharge risk:  Lack of support system Barriers to Discharge:  No Barriers Identified   Anell Barr 01/30/2017, 6:27 PM

## 2017-01-30 NOTE — Discharge Summary (Signed)
Taylor Johnson, 71 y.o., DOB Apr 10, 1946, MRN [id number]. Admission date: 01/27/2017 Discharge Date 01/30/2017 Primary MD [name], MD Admitting Physician [name], MD  Admission Diagnosis  Abdominal pain, unspecified abdominal location [R10.9] Intractable vomiting with nausea, unspecified vomiting type [R11.2]  Discharge Diagnosis   Active Problems:   Abdominal pain   Nausea and vomiting   Severe diverticulosis without diverticulitis   Possible esophageal dysfunction   Asthma   GERD   Urge incontinence   Obstructive sleep apnea    Developmental delay  Depression          Johnson Course  [name]  is a 71 y.o. female with a known history of anxiety,depression,asthma is being admitted for abdominal pain, nausea/vomiting. She's been going back and forth to different doctors about it for a couple of weeks. Patient had a CT scan of the abdomen which showed severe diverticulosis without diverticulitis. Due to her symptoms she was seen in consultation by surgery as well due to noted gallstone that did not feel that this was gallbladder related. Patient was seen by GI and and underwent EGD which was normal.  Patient also was seen by speech there is also concern for esophageal dysfunction. With her significant diverticulosis her symptoms may be related to uncomplicated diverticular disease which is giving her abdominal pain and symptoms. Patient also may need a gastric emptying study as outpatient.   Patient's symptoms are completely not explain. She needs referral to a tertiary care center I am referring her to  GI clinic.              Consults  GI  Significant Tests:  See full reports for all details     Ct Abdomen Pelvis W Contrast  Result Date: 01/27/2017 CLINICAL DATA:  71 year old female with abdominal pain. EXAM: CT ABDOMEN AND PELVIS WITH CONTRAST TECHNIQUE: Multidetector CT imaging of the abdomen and  pelvis was performed using the standard protocol following bolus administration of intravenous contrast. CONTRAST:  153mL ISOVUE-300 IOPAMIDOL (ISOVUE-300) INJECTION 61% COMPARISON:  Ultrasound dated 01/12/2017 and CT of the abdomen pelvis dated 12/11/2016 FINDINGS: Lower chest: There are bibasilar linear atelectasis/ scarring as well as small scattered pneumatoceles. There is mild cardiomegaly. Partially visualized pericardial effusion measuring up to 14 mm similar to the prior CT. Correlation with echocardiogram recommended. No intra-abdominal free air or free fluid. Hepatobiliary: Probable mild fatty infiltration of the liver. No intrahepatic biliary ductal dilatation. The gallbladder is unremarkable. Pancreas: Unremarkable. No pancreatic ductal dilatation or surrounding inflammatory changes. Spleen: Normal in size without focal abnormality. Adrenals/Urinary Tract: Adrenal glands are unremarkable. Kidneys are normal, without renal calculi, focal lesion, or hydronephrosis. Bladder is unremarkable. Stomach/Bowel: There is severe diverticulosis of the entire colon. Multiple duodenal diverticula noted measuring up to approximately 5 cm. No evidence of active inflammation. There is no bowel obstruction. Normal appendix. Vascular/Lymphatic: The abdominal aorta and IVC appear unremarkable. No portal venous gas identified. There is no adenopathy. Reproductive: Enlarged myomatous uterus. The ovaries appear unremarkable. Other: Small fat containing umbilical hernia. Musculoskeletal: Mild degenerative changes of the spine. No acute osseous pathology. IMPRESSION: 1. No acute intra-abdominal or pelvic pathology. 2. Severe pancolonic diverticulosis and large duodenal diverticulum without active inflammatory changes. No bowel obstruction. Normal appendix. 3. Enlarged myomatous uterus. 4. Mild cardiomegaly with partially visualized pericardial effusion. Correlation with clinical exam and further evaluation with echocardiogram  recommended. Electronically Signed   By: Anner Crete M.D.   On: 01/27/2017 04:52  Dg Abd 2 Views  Result Date: 01/12/2017 CLINICAL DATA:  Right upper quadrant pain EXAM: ABDOMEN - 2 VIEW COMPARISON:  None. FINDINGS: The bowel gas pattern is normal. There is no evidence of free air. Calcified uterine fibroids are noted. IMPRESSION: Nonobstructive bowel gas pattern. Electronically Signed   By: Ulyses Jarred M.D.   On: 01/12/2017 20:16   US Abdomen Limited Ruq  Result Date: 01/12/2017 CLINICAL DATA:  Right upper quadrant abdominal pain EXAM: ULTRASOUND ABDOMEN LIMITED RIGHT UPPER QUADRANT COMPARISON:  CT 12/11/2016 FINDINGS: Gallbladder: Gallstone present measuring up to 6.7 mm. Negative sonographic Murphy. Normal wall thickness. Common bile duct: Diameter: Borderline enlarged at 6.8 mm Liver: No focal lesion identified. Within normal limits in parenchymal echogenicity. IMPRESSION: 1. Cholelithiasis without sonographic evidence for acute cholecystitis 2. Borderline enlarged common bile duct Electronically Signed   By: Donavan Foil M.D.   On: 01/12/2017 21:17       Today   Subjective:   Taylor Johnson  Abdominal pain still persists but able to eat all breakfast and lunch    Blood pressure (!) 169/74, pulse 75, temperature 97.9 F (36.6 C), temperature source Oral, resp. rate 20, height 5\' 4"  (1.626 m), weight 267 lb 4.8 oz (121.2 kg), SpO2 95 %.  .  Intake/Output Summary (Last 24 hours) at 01/30/17 1419 Last data filed at 01/30/17 1207  Gross per 24 hour  Intake          3065.71 ml  Output             1125 ml  Net          1940.71 ml    Exam VITAL SIGNS: Blood pressure (!) 169/74, pulse 75, temperature 97.9 F (36.6 C), temperature source Oral, resp. rate 20, height 5\' 4"  (1.626 m), weight 267 lb 4.8 oz (121.2 kg), SpO2 95 %.  GENERAL:  71 y.o.-year-old patient lying in the bed with no acute distress.  EYES: Pupils equal, round, reactive to light and accommodation. No scleral  icterus. Extraocular muscles intact.  HEENT: Head atraumatic, normocephalic. Oropharynx and nasopharynx clear.  NECK:  Supple, no jugular venous distention. No thyroid enlargement, no tenderness.  LUNGS: Normal breath sounds bilaterally, no wheezing, rales,rhonchi or crepitation. No use of accessory muscles of respiration.  CARDIOVASCULAR: S1, S2 normal. No murmurs, rubs, or gallops.  ABDOMEN: Soft, nontender, nondistended. Bowel sounds present. No organomegaly or mass.  EXTREMITIES: No pedal edema, cyanosis, or clubbing.  NEUROLOGIC: Cranial nerves II through XII are intact. Muscle strength 5/5 in all extremities. Sensation intact. Gait not checked.  PSYCHIATRIC: The patient is alert and oriented x 3.  SKIN: No obvious rash, lesion, or ulcer.   Data Review     CBC w Diff:  Lab Results  Component Value Date   WBC 6.5 01/29/2017   HGB 12.3 01/29/2017   HGB 12.2 06/02/2016   HCT 38.2 01/29/2017   HCT 39.0 06/02/2016   PLT 212 01/29/2017   PLT 246 06/02/2016   LYMPHOPCT 15 10/07/2016   LYMPHOPCT 24.7 02/01/2014   MONOPCT 7 10/07/2016   MONOPCT 8.1 02/01/2014   EOSPCT 1 10/07/2016   EOSPCT 3.6 02/01/2014   BASOPCT 1 10/07/2016   BASOPCT 0.6 02/01/2014   CMP:  Lab Results  Component Value Date   NA 138 01/29/2017   NA 144 06/02/2016   NA 140 02/01/2014   K 3.4 (L) 01/29/2017   K 3.5 02/01/2014   CL 102 01/29/2017   CL 105 02/01/2014   CO2 29  01/29/2017   CO2 29 02/01/2014   BUN 18 01/29/2017   BUN 18 06/02/2016   BUN 14 02/01/2014   CREATININE 1.07 (H) 01/29/2017   CREATININE 1.04 02/01/2014   PROT 7.4 01/28/2017   PROT 7.3 06/02/2016   PROT 8.6 (H) 02/01/2014   ALBUMIN 3.6 01/28/2017   ALBUMIN 3.8 06/02/2016   ALBUMIN 3.6 02/01/2014   BILITOT 0.8 01/28/2017   BILITOT 0.3 06/02/2016   BILITOT 0.2 02/01/2014   ALKPHOS 79 01/28/2017   ALKPHOS 106 02/01/2014   AST 57 (H) 01/28/2017   AST 22 02/01/2014   ALT 62 (H) 01/28/2017   ALT 22 02/01/2014  .  Micro  Results No results found for this or any previous visit (from the past 240 hour(s)).      Code Status Orders        Start     Ordered   01/27/17 1046  Full code  Continuous     01/27/17 1045    Code Status History    Date Active Date Inactive Code Status Order ID Comments User Context   09/12/2015  8:38 AM 09/13/2015  3:57 PM Full Code [id number]  Bettey Costa, MD Inpatient          Follow-up Information    [name], MD. Call in 3 day(s).   Specialty:  Internal Medicine Why:  Please call when you get home and schedule an appointment with Dr. Army Melia this week.  [phone] Contact information: [address] [place] [address] [phone]        [facility] Follow up in 2 week(s).   Why:  [phone]; [facility] as new patient for abodominal pain; will contact you with appt. Contact information: [facility] [place], [place]  [address]           Discharge Medications   Allergies as of 01/30/2017   No Known Allergies     Medication List    STOP taking these medications   ciprofloxacin 250 MG tablet Commonly known as:  CIPRO     TAKE these medications   docusate sodium 100 MG capsule Commonly known as:  COLACE Take 1 capsule (100 mg total) by mouth daily as needed.   HYDROcodone-acetaminophen 5-325 MG tablet Commonly known as:  NORCO Take 1 tablet by mouth every 6 (six) hours as needed for moderate pain.   metoCLOPramide 5 MG tablet Commonly known as:  REGLAN Take 1 tablet (5 mg total) by mouth 3 (three) times daily.   omeprazole 20 MG capsule Commonly known as:  PRILOSEC Take 1 capsule (20 mg total) by mouth 2 (two) times daily.   sucralfate 1 GM/10ML suspension Commonly known as:  CARAFATE Take 10 mLs (1 g total) by mouth 4 (four) times daily -  with meals and at bedtime.          Total Time in preparing paper work, data evaluation and todays exam - 35  minutes  Dustin Flock M.D on 01/30/2017 at 2:19 PM  [facility]   Office  [phone]

## 2017-01-31 LAB — GLUCOSE, CAPILLARY
Glucose-Capillary: 104 mg/dL — ABNORMAL HIGH (ref 65–99)
Glucose-Capillary: 145 mg/dL — ABNORMAL HIGH (ref 65–99)

## 2017-01-31 LAB — CBC WITH DIFFERENTIAL/PLATELET
Basophils Absolute: 0 10*3/uL (ref 0–0.1)
Basophils Relative: 0 %
Eosinophils Absolute: 0.1 10*3/uL (ref 0–0.7)
Eosinophils Relative: 1 %
HCT: 38.3 % (ref 35.0–47.0)
Hemoglobin: 12.2 g/dL (ref 12.0–16.0)
Lymphocytes Relative: 24 %
Lymphs Abs: 1.8 10*3/uL (ref 1.0–3.6)
MCH: 27.5 pg (ref 26.0–34.0)
MCHC: 31.8 g/dL — ABNORMAL LOW (ref 32.0–36.0)
MCV: 86.6 fL (ref 80.0–100.0)
Monocytes Absolute: 0.9 10*3/uL (ref 0.2–0.9)
Monocytes Relative: 13 %
Neutro Abs: 4.6 10*3/uL (ref 1.4–6.5)
Neutrophils Relative %: 62 %
Platelets: 238 10*3/uL (ref 150–440)
RBC: 4.42 MIL/uL (ref 3.80–5.20)
RDW: 16.9 % — ABNORMAL HIGH (ref 11.5–14.5)
WBC: 7.4 10*3/uL (ref 3.6–11.0)

## 2017-01-31 MED ORDER — METHYLNALTREXONE BROMIDE 12 MG/0.6ML ~~LOC~~ SOLN: 12.0000 mg | Freq: Once | SUBCUTANEOUS | Status: DC

## 2017-01-31 MED ORDER — ONDANSETRON HCL 4 MG PO TABS: 4.0000 mg | ORAL_TABLET | Freq: Four times a day (QID) | ORAL | 0 refills | Status: DC | PRN

## 2017-01-31 MED ORDER — BISACODYL 10 MG RE SUPP
10.0000 mg | Freq: Every day | RECTAL | Status: DC
  Administered 2017-01-31: 10 mg via RECTAL
  Filled 2017-01-31 (×2): qty 1

## 2017-01-31 MED ORDER — POLYETHYLENE GLYCOL 3350 17 G PO PACK: 17.0000 g | PACK | Freq: Every day | ORAL | 0 refills | Status: DC

## 2017-01-31 MED ORDER — HYDROCODONE-ACETAMINOPHEN 5-325 MG PO TABS: 1.0000 | ORAL_TABLET | Freq: Four times a day (QID) | ORAL | 0 refills | Status: DC | PRN

## 2017-01-31 MED ORDER — POLYETHYLENE GLYCOL 3350 17 G PO PACK
17.0000 g | PACK | Freq: Every day | ORAL | Status: DC
  Administered 2017-01-31 – 2017-02-01 (×2): 17 g via ORAL
  Filled 2017-01-31 (×2): qty 1

## 2017-01-31 MED ORDER — LACTULOSE 10 GM/15ML PO SOLN
20.0000 g | Freq: Once | ORAL | Status: AC
  Administered 2017-01-31: 20 g via ORAL
  Filled 2017-01-31: qty 30

## 2017-01-31 MED ORDER — PANTOPRAZOLE SODIUM 40 MG PO TBEC: 40.0000 mg | DELAYED_RELEASE_TABLET | Freq: Two times a day (BID) | ORAL | Status: DC

## 2017-01-31 NOTE — Care Management Important Message (Signed)
Important Message  Patient Details  Name: Taylor Johnson MRN: [id number] Date of Birth: 03/29/1946   Medicare Important Message Given:  Yes    Beverly Sessions, RN 01/31/2017, 4:14 PM

## 2017-01-31 NOTE — Plan of Care (Signed)
Problem: Activity: Goal: Risk for activity intolerance will decrease Outcome: Progressing Patient needs continued rehab.

## 2017-01-31 NOTE — Progress Notes (Addendum)
LCSW following for disposition of needs: new placement  [facility] currently is completing passar information. LCSW spoke with patient's two sisters to understand information related to mis-match information within [facility] MUST system.  [place] MUST reflecting information related to patient's father social security number and mother's name:  Benjamine Mola. Sisters cannot remember or locate Social Security number for patient. Reports patient has never worked and has drawn Fish farm manager from her father all her life.  Reports that mother is Benjamine Mola and she has acted as her guardian all her life but never completed court documented paperwork.  LCSW obtained history from sister regarding mental health and developmental delays on patient's medical history. Sister reports patient had more of a learning disability and was never tested formerly regarding IQ or Autism for specific Dx.  LCSW will call the state to see how we can update the system. Call placed to Aleene Davidson in effort to assist with locating SSN in [facility] system, however she reports same SSN located in chart is what is on file. Suanne Marker is working to see if Insurance account manager can locate [employer] as patient does have medicaid.  Completed:  Medicaid worker was able to verify different SSN in which allowed LCSW to generate passar.   LCSW continues to work on passar and updated CM. Completed, LCSW was able to complete screening form for passar and upload 30 day note for review.  Still pending passar.  Updated MD.  Family to be updated as they are currently at a funeral and will be back around 2pm this afternoon. Discussed case with [facility] regarding bed offer.  Currently patient clinically ok and if bed available, [facility] will make offer, however currently pending.    Will follow up.  Lane Hacker, MSW Clinical Social Work: Printmaker Coverage for :  [phone]

## 2017-01-31 NOTE — Progress Notes (Signed)
[place] at [facility]                                                                                                                                                                                  Patient Demographics   Taylor Johnson, is a 71 y.o. female, DOB - [birth year], LNL:[id number]  Admit date - 01/27/2017   Admitting Physician [name], MD  Outpatient Primary MD for the patient is [name], MD   LOS - 4  Subjective: Patient is very weak and unable to ambulate   Review of Systems:   CONSTITUTIONAL: No documented fever. No fatigue, Positive weakness. No weight gain, no weight loss.  EYES: No blurry or double vision.  ENT: No tinnitus. No postnasal drip. No redness of the oropharynx.  RESPIRATORY: No cough, no wheeze, no hemoptysis. No dyspnea.  CARDIOVASCULAR: No chest pain. No orthopnea. No palpitations. No syncope.  GASTROINTESTINAL:  Currently no nausea, no vomiting or diarrhea. No abdominal pain. No melena or hematochezia.  GENITOURINARY: No dysuria or hematuria.  ENDOCRINE: No polyuria or nocturia. No heat or cold intolerance.  HEMATOLOGY: No anemia. No bruising. No bleeding.  INTEGUMENTARY: No rashes. No lesions.  MUSCULOSKELETAL: No arthritis. No swelling. No gout.  NEUROLOGIC: No numbness, tingling, or ataxia. No seizure-type activity.  PSYCHIATRIC: No anxiety. No insomnia. No ADD.    Vitals:   Vitals:   01/30/17 1432 01/30/17 2058 01/31/17 0409 01/31/17 0500  BP: (!) 146/57 (!) 147/54 (!) 148/59   Pulse: 76 72 78   Resp:  18 20   Temp:  98.6 F (37 C) 98.9 F (37.2 C)   TempSrc:  Oral Oral   SpO2:  94% 93%   Weight:    269 lb 1.6 oz (122.1 kg)  Height:        Wt Readings from Last 3 Encounters:  01/31/17 269 lb 1.6 oz (122.1 kg)  01/20/17 255 lb 9.6 oz (115.9 kg)  01/12/17 271 lb (122.9 kg)     Intake/Output Summary (Last 24 hours) at 01/31/17 1032 Last data filed at 01/31/17 1010  Gross per 24 hour   Intake          2024.71 ml  Output              700 ml  Net          1324.71 ml    Physical Exam:   GENERAL: Pleasant-appearing in no apparent distress.  HEAD, EYES, EARS, NOSE AND THROAT: Atraumatic, normocephalic. Extraocular muscles are intact. Pupils equal and reactive to light. Sclerae anicteric. No conjunctival injection. No oro-pharyngeal erythema.  NECK: Supple. There is no jugular venous distention.  No bruits, no lymphadenopathy, no thyromegaly.  HEART: Regular rate and rhythm,. No murmurs, no rubs, no clicks.  LUNGS: Clear to auscultation bilaterally. No rales or rhonchi. No wheezes.  ABDOMEN: Soft, flat, diffuse tenderness without guarding. Has good bowel sounds. No hepatosplenomegaly appreciated.  EXTREMITIES: No evidence of any cyanosis, clubbing, or peripheral edema.  +2 pedal and radial pulses bilaterally.  NEUROLOGIC: The patient is alert, awake, and oriented x3 with no focal motor or sensory deficits appreciated bilaterally.  SKIN: Moist and warm with no rashes appreciated.  Psych: Not anxious, depressed LN: No inguinal LN enlargement    Antibiotics   Anti-infectives    None      Medications   Scheduled Meds: . bisacodyl  10 mg Rectal Daily  . docusate sodium  100 mg Oral BID  . heparin  5,000 Units Subcutaneous Q8H  . hydrALAZINE  25 mg Oral Q8H  . mouth rinse  15 mL Mouth Rinse BID  . metoCLOPramide (REGLAN) injection  10 mg Intravenous Q8H  . pantoprazole  40 mg Oral BID AC  . polyethylene glycol  17 g Oral Daily  . sucralfate  1 g Oral TID WC & HS   Continuous Infusions:  PRN Meds:.acetaminophen **OR** acetaminophen, bisacodyl, hydrALAZINE, HYDROcodone-acetaminophen, morphine injection, ondansetron **OR** ondansetron (ZOFRAN) IV, promethazine, traZODone   Data Review:   Micro Results No results found for this or any previous visit (from the past 240 hour(s)).  Radiology Reports Ct Abdomen Pelvis W Contrast  Result Date: 01/27/2017 CLINICAL  DATA:  71 year old female with abdominal pain. EXAM: CT ABDOMEN AND PELVIS WITH CONTRAST TECHNIQUE: Multidetector CT imaging of the abdomen and pelvis was performed using the standard protocol following bolus administration of intravenous contrast. CONTRAST:  144mL ISOVUE-300 IOPAMIDOL (ISOVUE-300) INJECTION 61% COMPARISON:  Ultrasound dated 01/12/2017 and CT of the abdomen pelvis dated 12/11/2016 FINDINGS: Lower chest: There are bibasilar linear atelectasis/ scarring as well as small scattered pneumatoceles. There is mild cardiomegaly. Partially visualized pericardial effusion measuring up to 14 mm similar to the prior CT. Correlation with echocardiogram recommended. No intra-abdominal free air or free fluid. Hepatobiliary: Probable mild fatty infiltration of the liver. No intrahepatic biliary ductal dilatation. The gallbladder is unremarkable. Pancreas: Unremarkable. No pancreatic ductal dilatation or surrounding inflammatory changes. Spleen: Normal in size without focal abnormality. Adrenals/Urinary Tract: Adrenal glands are unremarkable. Kidneys are normal, without renal calculi, focal lesion, or hydronephrosis. Bladder is unremarkable. Stomach/Bowel: There is severe diverticulosis of the entire colon. Multiple duodenal diverticula noted measuring up to approximately 5 cm. No evidence of active inflammation. There is no bowel obstruction. Normal appendix. Vascular/Lymphatic: The abdominal aorta and IVC appear unremarkable. No portal venous gas identified. There is no adenopathy. Reproductive: Enlarged myomatous uterus. The ovaries appear unremarkable. Other: Small fat containing umbilical hernia. Musculoskeletal: Mild degenerative changes of the spine. No acute osseous pathology. IMPRESSION: 1. No acute intra-abdominal or pelvic pathology. 2. Severe pancolonic diverticulosis and large duodenal diverticulum without active inflammatory changes. No bowel obstruction. Normal appendix. 3. Enlarged myomatous uterus.  4. Mild cardiomegaly with partially visualized pericardial effusion. Correlation with clinical exam and further evaluation with echocardiogram recommended. Electronically Signed   By: Anner Crete M.D.   On: 01/27/2017 04:52   Dg Abd 2 Views  Result Date: 01/12/2017 CLINICAL DATA:  Right upper quadrant pain EXAM: ABDOMEN - 2 VIEW COMPARISON:  None. FINDINGS: The bowel gas pattern is normal. There is no evidence of free air. Calcified uterine fibroids are noted. IMPRESSION: Nonobstructive bowel gas pattern. Electronically Signed  By: Ulyses Jarred M.D.   On: 01/12/2017 20:16   US Abdomen Limited Ruq  Result Date: 01/12/2017 CLINICAL DATA:  Right upper quadrant abdominal pain EXAM: ULTRASOUND ABDOMEN LIMITED RIGHT UPPER QUADRANT COMPARISON:  CT 12/11/2016 FINDINGS: Gallbladder: Gallstone present measuring up to 6.7 mm. Negative sonographic Murphy. Normal wall thickness. Common bile duct: Diameter: Borderline enlarged at 6.8 mm Liver: No focal lesion identified. Within normal limits in parenchymal echogenicity. IMPRESSION: 1. Cholelithiasis without sonographic evidence for acute cholecystitis 2. Borderline enlarged common bile duct Electronically Signed   By: Donavan Foil M.D.   On: 01/12/2017 21:17     CBC  Recent Labs Lab 01/26/17 2353 01/27/17 1103 01/28/17 0412 01/29/17 0420  WBC 8.2 8.2 8.5 6.5  HGB 13.0 12.8 13.6 12.3  HCT 39.8 38.8 41.6 38.2  PLT 228 205 214 212  MCV 84.9 86.1 85.8 86.1  MCH 27.7 28.4 28.0 27.8  MCHC 32.6 32.9 32.6 32.3  RDW 16.1* 15.9* 16.0* 16.5*    Chemistries   Recent Labs Lab 01/26/17 2353 01/27/17 1103 01/28/17 0412 01/29/17 0420  NA 133*  --  138 138  K 3.4*  --  3.5 3.4*  CL 93*  --  98* 102  CO2 29  --  32 29  GLUCOSE 128*  --  106* 91  BUN 13  --  12 18  CREATININE 0.85 0.95 0.97 1.07*  CALCIUM 9.6  --  9.3 8.7*  AST 52*  --  57*  --   ALT 42  --  62*  --   ALKPHOS 84  --  79  --   BILITOT 0.8  --  0.8  --     ------------------------------------------------------------------------------------------------------------------ estimated creatinine clearance is 63.1 mL/min (A) (by C-G formula based on SCr of 1.07 mg/dL (H)). ------------------------------------------------------------------------------------------------------------------ No results for input(s): HGBA1C in the last 72 hours. ------------------------------------------------------------------------------------------------------------------ No results for input(s): CHOL, HDL, LDLCALC, TRIG, CHOLHDL, LDLDIRECT in the last 72 hours. ------------------------------------------------------------------------------------------------------------------ No results for input(s): TSH, T4TOTAL, T3FREE, THYROIDAB in the last 72 hours.  Invalid input(s): FREET3 ------------------------------------------------------------------------------------------------------------------ No results for input(s): VITAMINB12, FOLATE, FERRITIN, TIBC, IRON, RETICCTPCT in the last 72 hours.  Coagulation profile No results for input(s): INR, PROTIME in the last 168 hours.  No results for input(s): DDIMER in the last 72 hours.  Cardiac Enzymes No results for input(s): CKMB, TROPONINI, MYOGLOBIN in the last 168 hours.  Invalid input(s): CK ------------------------------------------------------------------------------------------------------------------ Invalid input(s): [place]   Patient is a 71 year old with history of severe diverticulosis presents with abdominal pain  1. Abdominal pain Liver function tests normal, lipase normal CT scan of the abdomen shows large mouth diverticulosis Patient's symptoms may be due to uncomplicated diverticular disease EGD shows no significant abnormality Vascular abnormalities involving her GI tract Continue empiric Reglan Seen by surgery they do not seem the gallbladder is the cause  2. History of  asthma currently not on medication  3. GERDContinue PPIs  4. Generalized weakness needs rehabilitation according to PT      Code Status Orders        Start     Ordered   01/27/17 1046  Full code  Continuous     01/27/17 1045    Code Status History    Date Active Date Inactive Code Status Order ID Comments User Context   09/12/2015  8:38 AM 09/13/2015  3:57 PM Full Code [id number]  Bettey Costa, MD Inpatient           Consults  Gastroenterology, surgery  DVT Prophylaxis  Lovenox   Lab Results  Component Value Date   PLT 212 01/29/2017     Time Spent in minutes   72minutes Greater than 50% of time spent in care coordination and counseling patient regarding the condition and plan of care.   Dustin Flock M.D on 01/31/2017 at 10:32 AM  Between 7am to 6pm - Pager - [phone]  After 6pm go to [email or web address] - password [email or web address] [facility]   Office  [phone]

## 2017-01-31 NOTE — Care Management (Signed)
Discharged discontinued due to hypotension.  [place] notified.

## 2017-01-31 NOTE — Discharge Summary (Deleted)
[place] at [facility], 71 y.o., DOB 01/14/1946, MRN [id number]. Admission date: 01/27/2017 Discharge Date 01/31/2017 Primary MD [name], MD Admitting Physician [name], MD  Admission Diagnosis  Abdominal pain, unspecified abdominal location [R10.9] Intractable vomiting with nausea, unspecified vomiting type [R11.2]  Discharge Diagnosis   Active Problems:   Abdominal pain   Nausea and vomiting   Severe diverticulosis without diverticulitis   Possible esophageal dysfunction   Asthma   GERD   Urge incontinence   Obstructive sleep apnea    Developmental delay  Depression          Hospital Course  Taylor Johnson  is a 71 y.o. female with a known history of anxiety,depression,asthma is being admitted for abdominal pain, nausea/vomiting. She's been going back and forth to different doctors about it for a couple of weeks. Patient had a CT scan of the abdomen which showed severe diverticulosis without diverticulitis. Due to her symptoms she was seen in consultation by surgery as well due to noted gallstone that did not feel that this was gallbladder related. Patient was seen by GI and and underwent EGD which was normal.  Patient also was seen by speech there is also concern for esophageal dysfunction. With her significant diverticulosis her symptoms may be related to uncomplicated diverticular disease which is giving her abdominal pain and symptoms. Patient also may need a gastric emptying study as outpatient.   Patient's symptoms are completely not explain. She needs referral to a tertiary care center I am referring her to  GI clinic.   Patient also is very weak and deconditioned in need of rehabilitation which is currently being arranged             Consults  GI  Significant Tests:  See full reports for all details     Ct Abdomen Pelvis W Contrast  Result Date: 01/27/2017 CLINICAL DATA:  71 year old female with abdominal  pain. EXAM: CT ABDOMEN AND PELVIS WITH CONTRAST TECHNIQUE: Multidetector CT imaging of the abdomen and pelvis was performed using the standard protocol following bolus administration of intravenous contrast. CONTRAST:  161mL ISOVUE-300 IOPAMIDOL (ISOVUE-300) INJECTION 61% COMPARISON:  Ultrasound dated 01/12/2017 and CT of the abdomen pelvis dated 12/11/2016 FINDINGS: Lower chest: There are bibasilar linear atelectasis/ scarring as well as small scattered pneumatoceles. There is mild cardiomegaly. Partially visualized pericardial effusion measuring up to 14 mm similar to the prior CT. Correlation with echocardiogram recommended. No intra-abdominal free air or free fluid. Hepatobiliary: Probable mild fatty infiltration of the liver. No intrahepatic biliary ductal dilatation. The gallbladder is unremarkable. Pancreas: Unremarkable. No pancreatic ductal dilatation or surrounding inflammatory changes. Spleen: Normal in size without focal abnormality. Adrenals/Urinary Tract: Adrenal glands are unremarkable. Kidneys are normal, without renal calculi, focal lesion, or hydronephrosis. Bladder is unremarkable. Stomach/Bowel: There is severe diverticulosis of the entire colon. Multiple duodenal diverticula noted measuring up to approximately 5 cm. No evidence of active inflammation. There is no bowel obstruction. Normal appendix. Vascular/Lymphatic: The abdominal aorta and IVC appear unremarkable. No portal venous gas identified. There is no adenopathy. Reproductive: Enlarged myomatous uterus. The ovaries appear unremarkable. Other: Small fat containing umbilical hernia. Musculoskeletal: Mild degenerative changes of the spine. No acute osseous pathology. IMPRESSION: 1. No acute intra-abdominal or pelvic pathology. 2. Severe pancolonic diverticulosis and large duodenal diverticulum without active inflammatory changes. No bowel obstruction. Normal appendix. 3. Enlarged myomatous uterus. 4. Mild cardiomegaly with partially  visualized pericardial effusion. Correlation with clinical exam and further evaluation  with echocardiogram recommended. Electronically Signed   By: Anner Crete M.D.   On: 01/27/2017 04:52   Dg Abd 2 Views  Result Date: 01/12/2017 CLINICAL DATA:  Right upper quadrant pain EXAM: ABDOMEN - 2 VIEW COMPARISON:  None. FINDINGS: The bowel gas pattern is normal. There is no evidence of free air. Calcified uterine fibroids are noted. IMPRESSION: Nonobstructive bowel gas pattern. Electronically Signed   By: Ulyses Jarred M.D.   On: 01/12/2017 20:16   US Abdomen Limited Ruq  Result Date: 01/12/2017 CLINICAL DATA:  Right upper quadrant abdominal pain EXAM: ULTRASOUND ABDOMEN LIMITED RIGHT UPPER QUADRANT COMPARISON:  CT 12/11/2016 FINDINGS: Gallbladder: Gallstone present measuring up to 6.7 mm. Negative sonographic Murphy. Normal wall thickness. Common bile duct: Diameter: Borderline enlarged at 6.8 mm Liver: No focal lesion identified. Within normal limits in parenchymal echogenicity. IMPRESSION: 1. Cholelithiasis without sonographic evidence for acute cholecystitis 2. Borderline enlarged common bile duct Electronically Signed   By: Donavan Foil M.D.   On: 01/12/2017 21:17       Today   Subjective:   Taylor Johnson  Abdominal pain still persists but able to eat all breakfast and lunch    Blood pressure (!) 148/59, pulse 78, temperature 98.9 F (37.2 C), temperature source Oral, resp. rate 20, height 5\' 4"  (1.626 m), weight 269 lb 1.6 oz (122.1 kg), SpO2 93 %.  .  Intake/Output Summary (Last 24 hours) at 01/31/17 1036 Last data filed at 01/31/17 1010  Gross per 24 hour  Intake          2024.71 ml  Output              700 ml  Net          1324.71 ml    Exam VITAL SIGNS: Blood pressure (!) 148/59, pulse 78, temperature 98.9 F (37.2 C), temperature source Oral, resp. rate 20, height 5\' 4"  (1.626 m), weight 269 lb 1.6 oz (122.1 kg), SpO2 93 %.  GENERAL:  71 y.o.-year-old patient lying in the  bed with no acute distress.  EYES: Pupils equal, round, reactive to light and accommodation. No scleral icterus. Extraocular muscles intact.  HEENT: Head atraumatic, normocephalic. Oropharynx and nasopharynx clear.  NECK:  Supple, no jugular venous distention. No thyroid enlargement, no tenderness.  LUNGS: Normal breath sounds bilaterally, no wheezing, rales,rhonchi or crepitation. No use of accessory muscles of respiration.  CARDIOVASCULAR: S1, S2 normal. No murmurs, rubs, or gallops.  ABDOMEN: Soft, nontender, nondistended. Bowel sounds present. No organomegaly or mass.  EXTREMITIES: No pedal edema, cyanosis, or clubbing.  NEUROLOGIC: Cranial nerves II through XII are intact. Muscle strength 5/5 in all extremities. Sensation intact. Gait not checked.  PSYCHIATRIC: The patient is alert and oriented x 3.  SKIN: No obvious rash, lesion, or ulcer.   Data Review     CBC w Diff:  Lab Results  Component Value Date   WBC 6.5 01/29/2017   HGB 12.3 01/29/2017   HGB 12.2 06/02/2016   HCT 38.2 01/29/2017   HCT 39.0 06/02/2016   PLT 212 01/29/2017   PLT 246 06/02/2016   LYMPHOPCT 15 10/07/2016   LYMPHOPCT 24.7 02/01/2014   MONOPCT 7 10/07/2016   MONOPCT 8.1 02/01/2014   EOSPCT 1 10/07/2016   EOSPCT 3.6 02/01/2014   BASOPCT 1 10/07/2016   BASOPCT 0.6 02/01/2014   CMP:  Lab Results  Component Value Date   NA 138 01/29/2017   NA 144 06/02/2016   NA 140 02/01/2014   K 3.4 (L)  01/29/2017   K 3.5 02/01/2014   CL 102 01/29/2017   CL 105 02/01/2014   CO2 29 01/29/2017   CO2 29 02/01/2014   BUN 18 01/29/2017   BUN 18 06/02/2016   BUN 14 02/01/2014   CREATININE 1.07 (H) 01/29/2017   CREATININE 1.04 02/01/2014   PROT 7.4 01/28/2017   PROT 7.3 06/02/2016   PROT 8.6 (H) 02/01/2014   ALBUMIN 3.6 01/28/2017   ALBUMIN 3.8 06/02/2016   ALBUMIN 3.6 02/01/2014   BILITOT 0.8 01/28/2017   BILITOT 0.3 06/02/2016   BILITOT 0.2 02/01/2014   ALKPHOS 79 01/28/2017   ALKPHOS 106 02/01/2014    AST 57 (H) 01/28/2017   AST 22 02/01/2014   ALT 62 (H) 01/28/2017   ALT 22 02/01/2014  .  Micro Results No results found for this or any previous visit (from the past 240 hour(s)).      Code Status Orders        Start     Ordered   01/27/17 1046  Full code  Continuous     01/27/17 1045    Code Status History    Date Active Date Inactive Code Status Order ID Comments User Context   09/12/2015  8:38 AM 09/13/2015  3:57 PM Full Code [id number]  Bettey Costa, MD Inpatient          Follow-up Information    [name], MD. Call in 3 day(s).   Specialty:  Internal Medicine Why:  Please call when you get home and schedule an appointment with Dr. Army Melia this week.  [phone] Contact information: [address] [place] [address] [phone]        [facility] Follow up in 2 week(s).   Why:  [phone]; [facility] clinic as new patient for abodominal pain; will contact you with appt. Contact information: [facility] [place], [place]  [address]           Discharge Medications   Allergies as of 01/31/2017   No Known Allergies     Medication List    STOP taking these medications   ciprofloxacin 250 MG tablet Commonly known as:  CIPRO   omeprazole 20 MG capsule Commonly known as:  PRILOSEC     TAKE these medications   docusate sodium 100 MG capsule Commonly known as:  COLACE Take 1 capsule (100 mg total) by mouth daily as needed.   HYDROcodone-acetaminophen 5-325 MG tablet Commonly known as:  NORCO Take 1 tablet by mouth every 6 (six) hours as needed for moderate pain.   metoCLOPramide 5 MG tablet Commonly known as:  REGLAN Take 1 tablet (5 mg total) by mouth 3 (three) times daily.   ondansetron 4 MG tablet Commonly known as:  ZOFRAN Take 1 tablet (4 mg total) by mouth every 6 (six) hours as needed for nausea.   pantoprazole 40 MG tablet Commonly known as:  PROTONIX Take 1  tablet (40 mg total) by mouth 2 (two) times daily before a meal.   polyethylene glycol packet Commonly known as:  MIRALAX / GLYCOLAX Take 17 g by mouth daily.   sucralfate 1 GM/10ML suspension Commonly known as:  CARAFATE Take 10 mLs (1 g total) by mouth 4 (four) times daily -  with meals and at bedtime.          Total Time in preparing paper work, data evaluation and todays exam - 65 minutes  Posey Pronto, [facility] M.D  on 01/31/2017 at 10:36 AM  [facility]   Office  [phone]

## 2017-01-31 NOTE — Clinical Social Work Note (Signed)
CSW received 30 day [facility] number which is [id number] E expiration date of 03/02/17.  CSW notified [facility] [place] who said they should have a bed available for patient to discharge on 02-01-17 if she is medically ready for discharge and orders have been received.  Jones Broom. Albert Lea, MSW, [place]  01/31/2017 4:52 PM

## 2017-01-31 NOTE — Progress Notes (Signed)
MD notified of hypotension. Orders given for 500cc bolus.

## 2017-02-01 ENCOUNTER — Inpatient Hospital Stay: Payer: Medicare Other

## 2017-02-01 ENCOUNTER — Encounter: Payer: Self-pay | Admitting: Radiology

## 2017-02-01 LAB — CORTISOL: Cortisol, Plasma: 13.2 ug/dL

## 2017-02-01 LAB — GLUCOSE, CAPILLARY
Glucose-Capillary: 133 mg/dL — ABNORMAL HIGH (ref 65–99)
Glucose-Capillary: 151 mg/dL — ABNORMAL HIGH (ref 65–99)
Glucose-Capillary: 120 mg/dL — ABNORMAL HIGH (ref 65–99)

## 2017-02-01 LAB — BASIC METABOLIC PANEL
Anion gap: 8 (ref 5–15)
BUN: 9 mg/dL (ref 6–20)
CO2: 30 mmol/L (ref 22–32)
Calcium: 8.6 mg/dL — ABNORMAL LOW (ref 8.9–10.3)
Chloride: 99 mmol/L — ABNORMAL LOW (ref 101–111)
Creatinine, Ser: 0.81 mg/dL (ref 0.44–1.00)
GFR calc Af Amer: >60 mL/min (ref >60)
GFR calc non Af Amer: >60 mL/min (ref >60)
Glucose, Bld: 121 mg/dL — ABNORMAL HIGH (ref 65–99)
Potassium: 3.3 mmol/L — ABNORMAL LOW (ref 3.5–5.1)
Sodium: 137 mmol/L (ref 135–145)

## 2017-02-01 MED ORDER — IOPAMIDOL (ISOVUE-370) INJECTION 76%
100.0000 mL | Freq: Once | INTRAVENOUS | Status: AC | PRN
Start: 2017-02-01 — End: 2017-02-01
  Administered 2017-02-01: 100 mL via INTRAVENOUS

## 2017-02-01 MED ORDER — MIDODRINE HCL 2.5 MG PO TABS
5.0000 mg | ORAL_TABLET | Freq: Three times a day (TID) | ORAL | Status: DC
  Administered 2017-02-01 – 2017-02-02 (×3): 5 mg via ORAL
  Filled 2017-02-01 (×3): qty 2
  Filled 2017-02-01: qty 1
  Filled 2017-02-01: qty 2
  Filled 2017-02-01: qty 1

## 2017-02-01 NOTE — Plan of Care (Signed)
Problem: Activity: Goal: Risk for activity intolerance will decrease Outcome: Not Progressing Pt to be discharged to [facility] for rehab.

## 2017-02-01 NOTE — Clinical Social Work Placement (Signed)
   CLINICAL SOCIAL WORK PLACEMENT  NOTE  Date:  02/01/2017  Patient Details  Name: Taylor Johnson MRN: [id number] Date of Birth: Oct 04, 1945  Clinical Social Work is seeking post-discharge placement for this patient at the [place] level of care (*CSW will initial, date and re-position this form in  chart as items are completed):  Yes   Patient/family provided with [place] Work Department's list of facilities offering this level of care within the geographic area requested by the patient (or if unable, by the patient's family).  Yes   Patient/family informed of their freedom to choose among providers that offer the needed level of care, that participate in Medicare, Medicaid or managed care program needed by the patient, have an available bed and are willing to accept the patient.  Yes   Patient/family informed of [facility]'s ownership interest in [facility] and [facility], as well as of the fact that they are under no obligation to receive care at these facilities.  PASRR submitted to EDS on 01/30/17     PASRR number received on       Existing PASRR number confirmed on       FL2 transmitted to all facilities in geographic area requested by pt/family on 01/30/17     FL2 transmitted to all facilities within larger geographic area on       Patient informed that his/her managed care company has contracts with or will negotiate with certain facilities, including the following:        Yes   Patient/family informed of bed offers received.  Patient chooses bed at     [facility]  Physician recommends and patient chooses bed at     SNF Patient to be transferred to   on  .  02/01/2017   Patient to be transferred to facility by     EMS  Patient family notified on   of transfer.  sisters  Name of family member notified:        PHYSICIAN Please sign FL2     Additional Comment:    _______________________________________________ Lilly Cove, LCSW 02/01/2017, 8:58 AM

## 2017-02-01 NOTE — Progress Notes (Signed)
[place] at [facility]                                                                                                                                                                                  Patient Demographics   Taylor Johnson, is a 71 y.o. female, DOB - [birth year], GMW:[id number]  Admit date - 01/27/2017   Admitting Physician [name], MD  Outpatient Primary MD for the patient is [name], MD   LOS - 5  Subjective: Plan was for patient to go to [facility] for [facility] however upon standing patient's blood pressure dropped. Discharge was canceled   Review of Systems:   CONSTITUTIONAL: No documented fever. No fatigue, Positive weakness. No weight gain, no weight loss.  EYES: No blurry or double vision.  ENT: No tinnitus. No postnasal drip. No redness of the oropharynx.  RESPIRATORY: No cough, no wheeze, no hemoptysis. No dyspnea.  CARDIOVASCULAR: No chest pain. No orthopnea. No palpitations. No syncope.  GASTROINTESTINAL:  Currently no nausea, no vomiting or diarrhea. No abdominal pain. No melena or hematochezia.  GENITOURINARY: No dysuria or hematuria.  ENDOCRINE: No polyuria or nocturia. No heat or cold intolerance.  HEMATOLOGY: No anemia. No bruising. No bleeding.  INTEGUMENTARY: No rashes. No lesions.  MUSCULOSKELETAL: No arthritis. No swelling. No gout.  NEUROLOGIC: No numbness, tingling, or ataxia. No seizure-type activity.  PSYCHIATRIC: No anxiety. No insomnia. No ADD.    Vitals:   Vitals:   02/01/17 1018 02/01/17 1027 02/01/17 1048 02/01/17 1159  BP: (!) 144/37 (!) 132/46 91/65 (!) 144/61  Pulse: 89 (!) 102 73 75  Resp:    18  Temp:    97.7 F (36.5 C)  TempSrc:    Oral  SpO2: 100% 100%  98%  Weight:      Height:        Wt Readings from Last 3 Encounters:  02/01/17 269 lb 4.8 oz (122.2 kg)  01/20/17 255 lb 9.6 oz (115.9 kg)  01/12/17 271 lb (122.9 kg)     Intake/Output Summary (Last 24 hours) at  02/01/17 1355 Last data filed at 01/31/17 2213  Gross per 24 hour  Intake                0 ml  Output              700 ml  Net             -700 ml    Physical Exam:   GENERAL: Pleasant-appearing in no apparent distress.  HEAD, EYES, EARS, NOSE AND THROAT: Atraumatic, normocephalic. Extraocular muscles are intact. Pupils equal and reactive to light. Sclerae anicteric.  No conjunctival injection. No oro-pharyngeal erythema.  NECK: Supple. There is no jugular venous distention. No bruits, no lymphadenopathy, no thyromegaly.  HEART: Regular rate and rhythm,. No murmurs, no rubs, no clicks.  LUNGS: Clear to auscultation bilaterally. No rales or rhonchi. No wheezes.  ABDOMEN: Soft, flat, diffuse tenderness without guarding. Has good bowel sounds. No hepatosplenomegaly appreciated.  EXTREMITIES: No evidence of any cyanosis, clubbing, or peripheral edema.  +2 pedal and radial pulses bilaterally.  NEUROLOGIC: The patient is alert, awake, and oriented x3 with no focal motor or sensory deficits appreciated bilaterally.  SKIN: Moist and warm with no rashes appreciated.  Psych: Not anxious, depressed LN: No inguinal LN enlargement    Antibiotics   Anti-infectives    None      Medications   Scheduled Meds: . bisacodyl  10 mg Rectal Daily  . docusate sodium  100 mg Oral BID  . heparin  5,000 Units Subcutaneous Q8H  . mouth rinse  15 mL Mouth Rinse BID  . midodrine  5 mg Oral TID WC  . pantoprazole  40 mg Oral BID AC  . polyethylene glycol  17 g Oral Daily  . sucralfate  1 g Oral TID WC & HS   Continuous Infusions:  PRN Meds:.acetaminophen **OR** acetaminophen, bisacodyl, hydrALAZINE, ondansetron **OR** ondansetron (ZOFRAN) IV, promethazine, traZODone   Data Review:   Micro Results No results found for this or any previous visit (from the past 240 hour(s)).  Radiology Reports Ct Abdomen Pelvis W Contrast  Result Date: 01/27/2017 CLINICAL DATA:  71 year old female with abdominal  pain. EXAM: CT ABDOMEN AND PELVIS WITH CONTRAST TECHNIQUE: Multidetector CT imaging of the abdomen and pelvis was performed using the standard protocol following bolus administration of intravenous contrast. CONTRAST:  175mL ISOVUE-300 IOPAMIDOL (ISOVUE-300) INJECTION 61% COMPARISON:  Ultrasound dated 01/12/2017 and CT of the abdomen pelvis dated 12/11/2016 FINDINGS: Lower chest: There are bibasilar linear atelectasis/ scarring as well as small scattered pneumatoceles. There is mild cardiomegaly. Partially visualized pericardial effusion measuring up to 14 mm similar to the prior CT. Correlation with echocardiogram recommended. No intra-abdominal free air or free fluid. Hepatobiliary: Probable mild fatty infiltration of the liver. No intrahepatic biliary ductal dilatation. The gallbladder is unremarkable. Pancreas: Unremarkable. No pancreatic ductal dilatation or surrounding inflammatory changes. Spleen: Normal in size without focal abnormality. Adrenals/Urinary Tract: Adrenal glands are unremarkable. Kidneys are normal, without renal calculi, focal lesion, or hydronephrosis. Bladder is unremarkable. Stomach/Bowel: There is severe diverticulosis of the entire colon. Multiple duodenal diverticula noted measuring up to approximately 5 cm. No evidence of active inflammation. There is no bowel obstruction. Normal appendix. Vascular/Lymphatic: The abdominal aorta and IVC appear unremarkable. No portal venous gas identified. There is no adenopathy. Reproductive: Enlarged myomatous uterus. The ovaries appear unremarkable. Other: Small fat containing umbilical hernia. Musculoskeletal: Mild degenerative changes of the spine. No acute osseous pathology. IMPRESSION: 1. No acute intra-abdominal or pelvic pathology. 2. Severe pancolonic diverticulosis and large duodenal diverticulum without active inflammatory changes. No bowel obstruction. Normal appendix. 3. Enlarged myomatous uterus. 4. Mild cardiomegaly with partially  visualized pericardial effusion. Correlation with clinical exam and further evaluation with echocardiogram recommended. Electronically Signed   By: Anner Crete M.D.   On: 01/27/2017 04:52   Dg Abd 2 Views  Result Date: 01/12/2017 CLINICAL DATA:  Right upper quadrant pain EXAM: ABDOMEN - 2 VIEW COMPARISON:  None. FINDINGS: The bowel gas pattern is normal. There is no evidence of free air. Calcified uterine fibroids are noted. IMPRESSION: Nonobstructive bowel gas pattern.  Electronically Signed   By: Ulyses Jarred M.D.   On: 01/12/2017 20:16   US Abdomen Limited Ruq  Result Date: 01/12/2017 CLINICAL DATA:  Right upper quadrant abdominal pain EXAM: ULTRASOUND ABDOMEN LIMITED RIGHT UPPER QUADRANT COMPARISON:  CT 12/11/2016 FINDINGS: Gallbladder: Gallstone present measuring up to 6.7 mm. Negative sonographic Murphy. Normal wall thickness. Common bile duct: Diameter: Borderline enlarged at 6.8 mm Liver: No focal lesion identified. Within normal limits in parenchymal echogenicity. IMPRESSION: 1. Cholelithiasis without sonographic evidence for acute cholecystitis 2. Borderline enlarged common bile duct Electronically Signed   By: Donavan Foil M.D.   On: 01/12/2017 21:17     CBC  Recent Labs Lab 01/26/17 2353 01/27/17 1103 01/28/17 0412 01/29/17 0420 01/31/17 1511  WBC 8.2 8.2 8.5 6.5 7.4  HGB 13.0 12.8 13.6 12.3 12.2  HCT 39.8 38.8 41.6 38.2 38.3  PLT 228 205 214 212 238  MCV 84.9 86.1 85.8 86.1 86.6  MCH 27.7 28.4 28.0 27.8 27.5  MCHC 32.6 32.9 32.6 32.3 31.8*  RDW 16.1* 15.9* 16.0* 16.5* 16.9*  LYMPHSABS  --   --   --   --  1.8  MONOABS  --   --   --   --  0.9  EOSABS  --   --   --   --  0.1  BASOSABS  --   --   --   --  0.0    Chemistries   Recent Labs Lab 01/26/17 2353 01/27/17 1103 01/28/17 0412 01/29/17 0420 02/01/17 0444  NA 133*  --  138 138 137  K 3.4*  --  3.5 3.4* 3.3*  CL 93*  --  98* 102 99*  CO2 29  --  32 29 30  GLUCOSE 128*  --  106* 91 121*  BUN 13  --   12 18 9   CREATININE 0.85 0.95 0.97 1.07* 0.81  CALCIUM 9.6  --  9.3 8.7* 8.6*  AST 52*  --  57*  --   --   ALT 42  --  62*  --   --   ALKPHOS 84  --  79  --   --   BILITOT 0.8  --  0.8  --   --    ------------------------------------------------------------------------------------------------------------------ estimated creatinine clearance is 83.4 mL/min (by C-G formula based on SCr of 0.81 mg/dL). ------------------------------------------------------------------------------------------------------------------ No results for input(s): HGBA1C in the last 72 hours. ------------------------------------------------------------------------------------------------------------------ No results for input(s): CHOL, HDL, LDLCALC, TRIG, CHOLHDL, LDLDIRECT in the last 72 hours. ------------------------------------------------------------------------------------------------------------------ No results for input(s): TSH, T4TOTAL, T3FREE, THYROIDAB in the last 72 hours.  Invalid input(s): FREET3 ------------------------------------------------------------------------------------------------------------------ No results for input(s): VITAMINB12, FOLATE, FERRITIN, TIBC, IRON, RETICCTPCT in the last 72 hours.  Coagulation profile No results for input(s): INR, PROTIME in the last 168 hours.  No results for input(s): DDIMER in the last 72 hours.  Cardiac Enzymes No results for input(s): CKMB, TROPONINI, MYOGLOBIN in the last 168 hours.  Invalid input(s): CK ------------------------------------------------------------------------------------------------------------------ Invalid input(s): [place]   Patient is a 71 year old with history of severe diverticulosis presents with abdominal pain  1. Abdominal pain Liver function tests normal, lipase normal CT scan of the abdomen shows large mouth diverticulosis Patient's symptoms may be due to uncomplicated diverticular  disease EGD shows no significant abnormality Vascular abnormalities involving her GI tract Continue empiric Reglan Seen by surgery they do not seem the gallbladder is the cause  2. Orthostatic hypotension I will stop her blood pressure medications give her  IV fluids monitor blood pressure  3. GERDContinue PPIs  4. Generalized weakness needs rehabilitation according to PT      Code Status Orders        Start     Ordered   01/27/17 1046  Full code  Continuous     01/27/17 1045    Code Status History    Date Active Date Inactive Code Status Order ID Comments User Context   09/12/2015  8:38 AM 09/13/2015  3:57 PM Full Code [id number]  Bettey Costa, MD Inpatient           Consults  Gastroenterology, surgery  DVT Prophylaxis  Lovenox   Lab Results  Component Value Date   PLT 238 01/31/2017     Time Spent in minutes   77minutes Greater than 50% of time spent in care coordination and counseling patient regarding the condition and plan of care.   Dustin Flock M.D on 02/01/2017 at 1:55 PM  Between 7am to 6pm - Pager - [phone]  After 6pm go to [email or web address] - password [email or web address] [facility]   Office  [phone]

## 2017-02-01 NOTE — Plan of Care (Signed)
Problem: Activity: Goal: Risk for activity intolerance will decrease Outcome: Not Progressing Patient needs physical therapy/rehab to address ambulation.

## 2017-02-01 NOTE — Progress Notes (Signed)
Pt unable to stand for orthostatic pressures. Pt was offered walker for assistance and 3 staff members to help. Pt reported her legs felt too weak and that she was dizzy.

## 2017-02-01 NOTE — Progress Notes (Addendum)
LCSW following for discharge planning.  Patient's passar has been received and completed. Bed offer at [facility] for later this afternoon. LCSW will update family and arrange for transportation.  Family aware of plan and in agreement with [facility]. Also aware of need for BP standing as staff has had difficulty with patient getting up either due to fear/refusal or something medically going on. Family will be arriving to hospital around 11:30am to assist.  RN made aware of plan.  Will follow up with bed and report number for RN.  Lane Hacker, MSW Clinical Social Work: [employer] Coverage for :  [phone]

## 2017-02-01 NOTE — Consult Note (Signed)
   [facility] CM Inpatient Consult   02/01/2017  Taylor Johnson 11/22/1945 [id number]   Patient screened for potential [facility] services. Patient is on the [facility] registry as a benefit of their [employer] . Electronic medical record reveals patient's discharge plan is SNF. [facility] Care Management services not appropriate at this time. If patient's post hospital needs change please place a [facility] Care Management consult. For questions please contact:   Stashia Sia RN, [facility] Liaison  [phone]) Business Mobile [phone]) Toll free office

## 2017-02-01 NOTE — Progress Notes (Signed)
[place] at [facility]                                                                                                                                                                                  Patient Demographics   Taylor Johnson, is a 71 y.o. female, DOB - 1946-03-31, ZOX:[id number]  Admit date - 01/27/2017   Admitting Physician [name], MD  Outpatient Primary MD for the patient is [name], MD   LOS - 5  Subjective: Pt bp was low with standing refused to sit up  Review of Systems:   CONSTITUTIONAL: No documented fever. No fatigue, Positive weakness. No weight gain, no weight loss.  EYES: No blurry or double vision.  ENT: No tinnitus. No postnasal drip. No redness of the oropharynx.  RESPIRATORY: No cough, no wheeze, no hemoptysis. No dyspnea.  CARDIOVASCULAR: No chest pain. No orthopnea. No palpitations. No syncope.  GASTROINTESTINAL:  Currently no nausea, no vomiting or diarrhea. + abdominal pain. No melena or hematochezia.  GENITOURINARY: No dysuria or hematuria.  ENDOCRINE: No polyuria or nocturia. No heat or cold intolerance.  HEMATOLOGY: No anemia. No bruising. No bleeding.  INTEGUMENTARY: No rashes. No lesions.  MUSCULOSKELETAL: No arthritis. No swelling. No gout.  NEUROLOGIC: No numbness, tingling, or ataxia. No seizure-type activity.  PSYCHIATRIC: No anxiety. No insomnia. No ADD.    Vitals:   Vitals:   02/01/17 1018 02/01/17 1027 02/01/17 1048 02/01/17 1159  BP: (!) 144/37 (!) 132/46 91/65 (!) 144/61  Pulse: 89 (!) 102 73 75  Resp:    18  Temp:    97.7 F (36.5 C)  TempSrc:    Oral  SpO2: 100% 100%  98%  Weight:      Height:        Wt Readings from Last 3 Encounters:  02/01/17 269 lb 4.8 oz (122.2 kg)  01/20/17 255 lb 9.6 oz (115.9 kg)  01/12/17 271 lb (122.9 kg)     Intake/Output Summary (Last 24 hours) at 02/01/17 1356 Last data filed at 01/31/17 2213  Gross per 24 hour  Intake                0 ml   Output              700 ml  Net             -700 ml    Physical Exam:   GENERAL: Pleasant-appearing in no apparent distress.  HEAD, EYES, EARS, NOSE AND THROAT: Atraumatic, normocephalic. Extraocular muscles are intact. Pupils equal and reactive to light. Sclerae anicteric. No conjunctival injection. No oro-pharyngeal erythema.  NECK: Supple. There is no  jugular venous distention. No bruits, no lymphadenopathy, no thyromegaly.  HEART: Regular rate and rhythm,. No murmurs, no rubs, no clicks.  LUNGS: Clear to auscultation bilaterally. No rales or rhonchi. No wheezes.  ABDOMEN: Soft, flat, diffuse tenderness without guarding. Has good bowel sounds. No hepatosplenomegaly appreciated.  EXTREMITIES: No evidence of any cyanosis, clubbing, or peripheral edema.  +2 pedal and radial pulses bilaterally.  NEUROLOGIC: The patient is alert, awake, and oriented x3 with no focal motor or sensory deficits appreciated bilaterally.  SKIN: Moist and warm with no rashes appreciated.  Psych: Not anxious, depressed LN: No inguinal LN enlargement    Antibiotics   Anti-infectives    None      Medications   Scheduled Meds: . bisacodyl  10 mg Rectal Daily  . docusate sodium  100 mg Oral BID  . heparin  5,000 Units Subcutaneous Q8H  . mouth rinse  15 mL Mouth Rinse BID  . midodrine  5 mg Oral TID WC  . pantoprazole  40 mg Oral BID AC  . polyethylene glycol  17 g Oral Daily  . sucralfate  1 g Oral TID WC & HS   Continuous Infusions:  PRN Meds:.acetaminophen **OR** acetaminophen, bisacodyl, hydrALAZINE, ondansetron **OR** ondansetron (ZOFRAN) IV, promethazine, traZODone   Data Review:   Micro Results No results found for this or any previous visit (from the past 240 hour(s)).  Radiology Reports Ct Abdomen Pelvis W Contrast  Result Date: 01/27/2017 CLINICAL DATA:  71 year old female with abdominal pain. EXAM: CT ABDOMEN AND PELVIS WITH CONTRAST TECHNIQUE: Multidetector CT imaging of the  abdomen and pelvis was performed using the standard protocol following bolus administration of intravenous contrast. CONTRAST:  166mL ISOVUE-300 IOPAMIDOL (ISOVUE-300) INJECTION 61% COMPARISON:  Ultrasound dated 01/12/2017 and CT of the abdomen pelvis dated 12/11/2016 FINDINGS: Lower chest: There are bibasilar linear atelectasis/ scarring as well as small scattered pneumatoceles. There is mild cardiomegaly. Partially visualized pericardial effusion measuring up to 14 mm similar to the prior CT. Correlation with echocardiogram recommended. No intra-abdominal free air or free fluid. Hepatobiliary: Probable mild fatty infiltration of the liver. No intrahepatic biliary ductal dilatation. The gallbladder is unremarkable. Pancreas: Unremarkable. No pancreatic ductal dilatation or surrounding inflammatory changes. Spleen: Normal in size without focal abnormality. Adrenals/Urinary Tract: Adrenal glands are unremarkable. Kidneys are normal, without renal calculi, focal lesion, or hydronephrosis. Bladder is unremarkable. Stomach/Bowel: There is severe diverticulosis of the entire colon. Multiple duodenal diverticula noted measuring up to approximately 5 cm. No evidence of active inflammation. There is no bowel obstruction. Normal appendix. Vascular/Lymphatic: The abdominal aorta and IVC appear unremarkable. No portal venous gas identified. There is no adenopathy. Reproductive: Enlarged myomatous uterus. The ovaries appear unremarkable. Other: Small fat containing umbilical hernia. Musculoskeletal: Mild degenerative changes of the spine. No acute osseous pathology. IMPRESSION: 1. No acute intra-abdominal or pelvic pathology. 2. Severe pancolonic diverticulosis and large duodenal diverticulum without active inflammatory changes. No bowel obstruction. Normal appendix. 3. Enlarged myomatous uterus. 4. Mild cardiomegaly with partially visualized pericardial effusion. Correlation with clinical exam and further evaluation with  echocardiogram recommended. Electronically Signed   By: Anner Crete M.D.   On: 01/27/2017 04:52   Dg Abd 2 Views  Result Date: 01/12/2017 CLINICAL DATA:  Right upper quadrant pain EXAM: ABDOMEN - 2 VIEW COMPARISON:  None. FINDINGS: The bowel gas pattern is normal. There is no evidence of free air. Calcified uterine fibroids are noted. IMPRESSION: Nonobstructive bowel gas pattern. Electronically Signed   By: Ulyses Jarred M.D.   On:  01/12/2017 20:16   US Abdomen Limited Ruq  Result Date: 01/12/2017 CLINICAL DATA:  Right upper quadrant abdominal pain EXAM: ULTRASOUND ABDOMEN LIMITED RIGHT UPPER QUADRANT COMPARISON:  CT 12/11/2016 FINDINGS: Gallbladder: Gallstone present measuring up to 6.7 mm. Negative sonographic Murphy. Normal wall thickness. Common bile duct: Diameter: Borderline enlarged at 6.8 mm Liver: No focal lesion identified. Within normal limits in parenchymal echogenicity. IMPRESSION: 1. Cholelithiasis without sonographic evidence for acute cholecystitis 2. Borderline enlarged common bile duct Electronically Signed   By: Donavan Foil M.D.   On: 01/12/2017 21:17     CBC  Recent Labs Lab 01/26/17 2353 01/27/17 1103 01/28/17 0412 01/29/17 0420 01/31/17 1511  WBC 8.2 8.2 8.5 6.5 7.4  HGB 13.0 12.8 13.6 12.3 12.2  HCT 39.8 38.8 41.6 38.2 38.3  PLT 228 205 214 212 238  MCV 84.9 86.1 85.8 86.1 86.6  MCH 27.7 28.4 28.0 27.8 27.5  MCHC 32.6 32.9 32.6 32.3 31.8*  RDW 16.1* 15.9* 16.0* 16.5* 16.9*  LYMPHSABS  --   --   --   --  1.8  MONOABS  --   --   --   --  0.9  EOSABS  --   --   --   --  0.1  BASOSABS  --   --   --   --  0.0    Chemistries   Recent Labs Lab 01/26/17 2353 01/27/17 1103 01/28/17 0412 01/29/17 0420 02/01/17 0444  NA 133*  --  138 138 137  K 3.4*  --  3.5 3.4* 3.3*  CL 93*  --  98* 102 99*  CO2 29  --  32 29 30  GLUCOSE 128*  --  106* 91 121*  BUN 13  --  12 18 9   CREATININE 0.85 0.95 0.97 1.07* 0.81  CALCIUM 9.6  --  9.3 8.7* 8.6*  AST 52*  --   57*  --   --   ALT 42  --  62*  --   --   ALKPHOS 84  --  79  --   --   BILITOT 0.8  --  0.8  --   --    ------------------------------------------------------------------------------------------------------------------ estimated creatinine clearance is 83.4 mL/min (by C-G formula based on SCr of 0.81 mg/dL). ------------------------------------------------------------------------------------------------------------------ No results for input(s): HGBA1C in the last 72 hours. ------------------------------------------------------------------------------------------------------------------ No results for input(s): CHOL, HDL, LDLCALC, TRIG, CHOLHDL, LDLDIRECT in the last 72 hours. ------------------------------------------------------------------------------------------------------------------ No results for input(s): TSH, T4TOTAL, T3FREE, THYROIDAB in the last 72 hours.  Invalid input(s): FREET3 ------------------------------------------------------------------------------------------------------------------ No results for input(s): VITAMINB12, FOLATE, FERRITIN, TIBC, IRON, RETICCTPCT in the last 72 hours.  Coagulation profile No results for input(s): INR, PROTIME in the last 168 hours.  No results for input(s): DDIMER in the last 72 hours.  Cardiac Enzymes No results for input(s): CKMB, TROPONINI, MYOGLOBIN in the last 168 hours.  Invalid input(s): CK ------------------------------------------------------------------------------------------------------------------ Invalid input(s): [place]   Patient is a 71 year old with history of severe diverticulosis presents with abdominal pain  1. Abdominal pain Liver function tests normal, lipase normal CT scan of the abdomen shows large mouth diverticulosis Patient's symptoms may be due to uncomplicated diverticular disease EGD shows no significant abnormality Obtain CT angiogram abdomen Seen by surgery they do not  seem the gallbladder is the cause  2. Orthostatic hypotension check random cortisol level  Patient on Midodrine  3. GERDContinue PPIs  4. Generalized weakness needs rehabilitation according to PT  Code Status Orders        Start     Ordered   01/27/17 1046  Full code  Continuous     01/27/17 1045    Code Status History    Date Active Date Inactive Code Status Order ID Comments User Context   09/12/2015  8:38 AM 09/13/2015  3:57 PM Full Code [id number]  Bettey Costa, MD Inpatient           Consults  Gastroenterology, surgery  DVT Prophylaxis  Lovenox   Lab Results  Component Value Date   PLT 238 01/31/2017     Time Spent in minutes   33minutes Greater than 50% of time spent in care coordination and counseling patient regarding the condition and plan of care.   Dustin Flock M.D on 02/01/2017 at 1:56 PM  Between 7am to 6pm - Pager - [phone]  After 6pm go to [email or web address] - password [email or web address] [facility]   Office  [phone]

## 2017-02-01 NOTE — Progress Notes (Signed)
Attempted to obtain orthostatic pressures again. Pt still unable to stand. Sit to stand lift used to obtain standing blood pressure.

## 2017-02-02 DIAGNOSIS — R1033 Periumbilical pain: Secondary | ICD-10-CM | POA: Diagnosis not present

## 2017-02-02 DIAGNOSIS — K219 Gastro-esophageal reflux disease without esophagitis: Secondary | ICD-10-CM | POA: Diagnosis not present

## 2017-02-02 DIAGNOSIS — N390 Urinary tract infection, site not specified: Secondary | ICD-10-CM | POA: Diagnosis not present

## 2017-02-02 DIAGNOSIS — R600 Localized edema: Secondary | ICD-10-CM | POA: Diagnosis not present

## 2017-02-02 DIAGNOSIS — M1000 Idiopathic gout, unspecified site: Secondary | ICD-10-CM | POA: Diagnosis not present

## 2017-02-02 DIAGNOSIS — R1030 Lower abdominal pain, unspecified: Secondary | ICD-10-CM | POA: Diagnosis not present

## 2017-02-02 DIAGNOSIS — R1310 Dysphagia, unspecified: Secondary | ICD-10-CM | POA: Diagnosis not present

## 2017-02-02 DIAGNOSIS — K5730 Diverticulosis of large intestine without perforation or abscess without bleeding: Secondary | ICD-10-CM | POA: Diagnosis not present

## 2017-02-02 DIAGNOSIS — K314 Gastric diverticulum: Secondary | ICD-10-CM | POA: Diagnosis not present

## 2017-02-02 DIAGNOSIS — R1084 Generalized abdominal pain: Secondary | ICD-10-CM | POA: Diagnosis not present

## 2017-02-02 DIAGNOSIS — R1013 Epigastric pain: Secondary | ICD-10-CM | POA: Diagnosis not present

## 2017-02-02 DIAGNOSIS — R112 Nausea with vomiting, unspecified: Secondary | ICD-10-CM | POA: Diagnosis not present

## 2017-02-02 DIAGNOSIS — I10 Essential (primary) hypertension: Secondary | ICD-10-CM | POA: Diagnosis not present

## 2017-02-02 DIAGNOSIS — M6281 Muscle weakness (generalized): Secondary | ICD-10-CM | POA: Diagnosis not present

## 2017-02-02 DIAGNOSIS — L603 Nail dystrophy: Secondary | ICD-10-CM | POA: Diagnosis not present

## 2017-02-02 DIAGNOSIS — I951 Orthostatic hypotension: Secondary | ICD-10-CM | POA: Diagnosis not present

## 2017-02-02 DIAGNOSIS — G8911 Acute pain due to trauma: Secondary | ICD-10-CM | POA: Diagnosis not present

## 2017-02-02 DIAGNOSIS — D649 Anemia, unspecified: Secondary | ICD-10-CM | POA: Diagnosis not present

## 2017-02-02 DIAGNOSIS — Z7401 Bed confinement status: Secondary | ICD-10-CM | POA: Diagnosis not present

## 2017-02-02 DIAGNOSIS — R42 Dizziness and giddiness: Secondary | ICD-10-CM | POA: Diagnosis not present

## 2017-02-02 DIAGNOSIS — R1312 Dysphagia, oropharyngeal phase: Secondary | ICD-10-CM | POA: Diagnosis not present

## 2017-02-02 DIAGNOSIS — I739 Peripheral vascular disease, unspecified: Secondary | ICD-10-CM | POA: Diagnosis not present

## 2017-02-02 DIAGNOSIS — R5381 Other malaise: Secondary | ICD-10-CM | POA: Diagnosis not present

## 2017-02-02 DIAGNOSIS — R109 Unspecified abdominal pain: Secondary | ICD-10-CM | POA: Diagnosis not present

## 2017-02-02 DIAGNOSIS — B351 Tinea unguium: Secondary | ICD-10-CM | POA: Diagnosis not present

## 2017-02-02 DIAGNOSIS — R262 Difficulty in walking, not elsewhere classified: Secondary | ICD-10-CM | POA: Diagnosis not present

## 2017-02-02 DIAGNOSIS — M109 Gout, unspecified: Secondary | ICD-10-CM | POA: Diagnosis not present

## 2017-02-02 DIAGNOSIS — E876 Hypokalemia: Secondary | ICD-10-CM | POA: Diagnosis not present

## 2017-02-02 DIAGNOSIS — K5900 Constipation, unspecified: Secondary | ICD-10-CM | POA: Diagnosis not present

## 2017-02-02 LAB — HEMOGLOBIN A1C
Hgb A1c MFr Bld: 6.1 % — ABNORMAL HIGH (ref 4.8–5.6)
Mean Plasma Glucose: 128 mg/dL

## 2017-02-02 LAB — GLUCOSE, CAPILLARY: Glucose-Capillary: 99 mg/dL (ref 65–99)

## 2017-02-02 MED ORDER — PREDNISONE 50 MG PO TABS: 50.0000 mg | ORAL_TABLET | Freq: Every day | ORAL | Status: DC

## 2017-02-02 MED ORDER — PREDNISONE 20 MG PO TABS: 40.0000 mg | ORAL_TABLET | Freq: Every day | ORAL | Status: DC

## 2017-02-02 MED ORDER — COLCHICINE 0.6 MG PO TABS: 0.6000 mg | ORAL_TABLET | Freq: Two times a day (BID) | ORAL | Status: DC | PRN

## 2017-02-02 MED ORDER — PREDNISONE 50 MG PO TABS: ORAL_TABLET | ORAL | Status: AC

## 2017-02-02 MED ORDER — MIDODRINE HCL 5 MG PO TABS: 5.0000 mg | ORAL_TABLET | Freq: Three times a day (TID) | ORAL | Status: DC

## 2017-02-02 NOTE — Discharge Summary (Signed)
Taylor Johnson at [facility], 71 y.o., DOB January 04, 1946, MRN [id number]. Admission date: 01/27/2017 Discharge Date 02/02/2017 Primary MD [name], MD Admitting Physician [name], MD  Admission Diagnosis  Abdominal pain, unspecified abdominal location [R10.9] Intractable vomiting with nausea, unspecified vomiting type [R11.2]  Discharge Diagnosis   Active Problems:   Abdominal pain   Nausea and vomiting   Severe diverticulosis without diverticulitis   Possible esophageal dysfunction   Asthma   GERD   Urge incontinence   Obstructive sleep apnea    Developmental delay  Depression   Acute gout flare   Orthostatic hypotension      Hospital Course  Taylor Johnson  is a 71 y.o. female with a known history of anxiety,depression,asthma is being admitted for abdominal pain, nausea/vomiting. She's been going back and forth to different doctors about it for a couple of weeks. Patient had a CT scan of the abdomen which showed severe diverticulosis without diverticulitis. Due to her symptoms she was seen in consultation by surgery as well due to noted gallstone that did not feel that this was gallbladder related. Patient was seen by GI and and underwent EGD which was normal.  Patient also was seen by speech there is also concern for esophageal dysfunction. With her significant diverticulosis her symptoms may be related to uncomplicated diverticular disease which is giving her abdominal pain and symptoms. Patient also may need a gastric emptying study as outpatient.   Patient's symptoms are completely not explain. She needs referral to a tertiary care center I am referring her to  GI clinic at [facility]   Patient also is very weak and deconditioned in need of rehabilitation which is currently being arranged  Patient discharge was held due to orthostatic hypotension was given IV fluids and started on Midrodrine. Patient also had acute gout flare  which is currently being treated with prednisone she'll need total of 3 more days at the nursing facility.           Consults  GI  Significant Tests:  See full reports for all details     Ct Abdomen Pelvis W Contrast  Result Date: 01/27/2017 CLINICAL DATA:  71 year old female with abdominal pain. EXAM: CT ABDOMEN AND PELVIS WITH CONTRAST TECHNIQUE: Multidetector CT imaging of the abdomen and pelvis was performed using the standard protocol following bolus administration of intravenous contrast. CONTRAST:  124mL ISOVUE-300 IOPAMIDOL (ISOVUE-300) INJECTION 61% COMPARISON:  Ultrasound dated 01/12/2017 and CT of the abdomen pelvis dated 12/11/2016 FINDINGS: Lower chest: There are bibasilar linear atelectasis/ scarring as well as small scattered pneumatoceles. There is mild cardiomegaly. Partially visualized pericardial effusion measuring up to 14 mm similar to the prior CT. Correlation with echocardiogram recommended. No intra-abdominal free air or free fluid. Hepatobiliary: Probable mild fatty infiltration of the liver. No intrahepatic biliary ductal dilatation. The gallbladder is unremarkable. Pancreas: Unremarkable. No pancreatic ductal dilatation or surrounding inflammatory changes. Spleen: Normal in size without focal abnormality. Adrenals/Urinary Tract: Adrenal glands are unremarkable. Kidneys are normal, without renal calculi, focal lesion, or hydronephrosis. Bladder is unremarkable. Stomach/Bowel: There is severe diverticulosis of the entire colon. Multiple duodenal diverticula noted measuring up to approximately 5 cm. No evidence of active inflammation. There is no bowel obstruction. Normal appendix. Vascular/Lymphatic: The abdominal aorta and IVC appear unremarkable. No portal venous gas identified. There is no adenopathy. Reproductive: Enlarged myomatous uterus. The ovaries appear unremarkable. Other: Small fat containing umbilical hernia. Musculoskeletal: Mild degenerative changes of the  spine. No  acute osseous pathology. IMPRESSION: 1. No acute intra-abdominal or pelvic pathology. 2. Severe pancolonic diverticulosis and large duodenal diverticulum without active inflammatory changes. No bowel obstruction. Normal appendix. 3. Enlarged myomatous uterus. 4. Mild cardiomegaly with partially visualized pericardial effusion. Correlation with clinical exam and further evaluation with echocardiogram recommended. Electronically Signed   By: Anner Crete M.D.   On: 01/27/2017 04:52   Dg Abd 2 Views  Result Date: 01/12/2017 CLINICAL DATA:  Right upper quadrant pain EXAM: ABDOMEN - 2 VIEW COMPARISON:  None. FINDINGS: The bowel gas pattern is normal. There is no evidence of free air. Calcified uterine fibroids are noted. IMPRESSION: Nonobstructive bowel gas pattern. Electronically Signed   By: Ulyses Jarred M.D.   On: 01/12/2017 20:16   Ct Angio Abd/pel W/ And/or W/o  Result Date: 02/01/2017 CLINICAL DATA:  Severe diverticulosis and abdominal pain. EXAM: CTA ABDOMEN AND PELVIS WITH CONTRAST TECHNIQUE: Multidetector CT imaging of the abdomen and pelvis was performed using the standard protocol during bolus administration of intravenous contrast. Multiplanar reconstructed images and MIPs were obtained and reviewed to evaluate the vascular anatomy. CONTRAST:  100 cc Isovue 370 COMPARISON:  CT abdomen pelvis - 01/27/2017 ; 09/12/2015; 04/25/2015 FINDINGS: VASCULAR Aorta: Normal caliber the abdominal aorta. No significant atherosclerotic plaque. No evidence of abdominal aortic dissection or periaortic stranding. Celiac: Widely patent without hemodynamically significant narrowing. Conventional branching pattern. SMA: Widely patent without hemodynamically significant narrowing. A replaced right hepatic artery arises from the proximal SMA. Otherwise, conventional branching pattern. The distal tributaries of the SMA appear widely patent without discrete intraluminal filling defect to suggest distal embolism.  Renals: Duplicated right-sided renal arteries. There are 3 discrete left-sided renal arteries. All renal arteries appear widely patent without hemodynamically significant stenosis. No vessel irregularity to suggest FMD. IMA: Widely patent without hemodynamically significant narrowing. Inflow: The bilateral common, internal and external iliac arteries are of normal caliber and widely patent without hemodynamically significant stenosis. Proximal Outflow: The bilateral common, superficial and deep femoral arteries are widely patent without hemodynamically significant narrowing. Veins: The pelvic venous system and IVC appears widely patent. Review of the MIP images confirms the above findings. NON-VASCULAR Lower chest: Limited visualization of the lower thorax demonstrates minimal dependent subpleural ground-glass atelectasis. No focal airspace opacities. No pleural effusion. Cardiomegaly.  Trace pericardial effusion, unchanged. Hepatobiliary: Normal hepatic contour. No discrete hepatic lesions. Normal appearance of the gallbladder given degree distention. No definite radiopaque gallstones. No intra extrahepatic biliary duct dilatation. No ascites. Pancreas: Normal appearance of the pancreas Spleen: Normal appearance of the spleen Adrenals/Urinary Tract: There is symmetric enhancement of the bilateral kidneys. No definite renal stones this postcontrast examination. No discrete renal lesions. No urine obstruction or perinephric stranding. Normal appearance the bilateral adrenal glands. Normal appearance of the urinary bladder given degree distention. Stomach/Bowel: Extensive colonic diverticulosis without evidence of superimposed acute diverticulitis. The cecum is noted to be located within the right mid hemiabdomen. Re- demonstrated large duodenal diverticulum. Normal appearance of the terminal ileum and air field retrocecal appendix. No pneumoperitoneum, pneumatosis or portal venous gas. Lymphatic: Scattered  retroperitoneal lymph nodes are numerous though individually not enlarged by size criteria and presumably secondary did patient body habitus. No bulky retroperitoneal, mesenteric, pelvic or inguinal lymphadenopathy. Reproductive: Re- demonstrated partially calcified degenerating uterine fibroids with dominant fibroid within in the inferior segment of the left side of the uterus measuring approximately 5.4 cm and dominant fibroid within the left-sided the dome with the uterine fundus measuring approximately 7.6 cm. No discrete adnexal lesion. There  is a small amount of presumably reactive free fluid within the pelvic cul-de-sac. Other: Subcutaneous edema about the midline of the low back, lower abdominal pannus and lateral aspects of the thighs bilaterally. Musculoskeletal: No acute or aggressive osseous abnormalities. Stigmata of DISH within the lower thoracic spine. Bilateral facet degenerative change within the lower lumbar spine bilaterally. IMPRESSION: VASCULAR 1. No acute arterial findings within the abdomen or pelvis. Specifically, no evidence of mesenteric ischemia. 2. No evidence of abdominal aortic aneurysm or significant atherosclerotic plaque. 3. Re- demonstrated mild cardiomegaly and small pericardial effusion. NON-VASCULAR 1. Extensive colonic diverticulosis without evidence of diverticulitis. 2. Fibroid uterus. Electronically Signed   By: Sandi Mariscal M.D.   On: 02/01/2017 15:34   US Abdomen Limited Ruq  Result Date: 01/12/2017 CLINICAL DATA:  Right upper quadrant abdominal pain EXAM: ULTRASOUND ABDOMEN LIMITED RIGHT UPPER QUADRANT COMPARISON:  CT 12/11/2016 FINDINGS: Gallbladder: Gallstone present measuring up to 6.7 mm. Negative sonographic Murphy. Normal wall thickness. Common bile duct: Diameter: Borderline enlarged at 6.8 mm Liver: No focal lesion identified. Within normal limits in parenchymal echogenicity. IMPRESSION: 1. Cholelithiasis without sonographic evidence for acute cholecystitis 2.  Borderline enlarged common bile duct Electronically Signed   By: Donavan Foil M.D.   On: 01/12/2017 21:17       Today   Subjective:   Taylor Johnson  Abdominal pain still persists but able to eat all breakfast and lunch    Blood pressure (!) 140/52, pulse 79, temperature 99.8 F (37.7 C), temperature source Oral, resp. rate 20, height 5\' 4"  (1.626 m), weight 270 lb 9.6 oz (122.7 kg), SpO2 94 %.  .  Intake/Output Summary (Last 24 hours) at 02/02/17 1133 Last data filed at 02/02/17 0909  Gross per 24 hour  Intake              240 ml  Output              200 ml  Net               40 ml    Exam VITAL SIGNS: Blood pressure (!) 140/52, pulse 79, temperature 99.8 F (37.7 C), temperature source Oral, resp. rate 20, height 5\' 4"  (1.626 m), weight 270 lb 9.6 oz (122.7 kg), SpO2 94 %.  GENERAL:  71 y.o.-year-old patient lying in the bed with no acute distress.  EYES: Pupils equal, round, reactive to light and accommodation. No scleral icterus. Extraocular muscles intact.  HEENT: Head atraumatic, normocephalic. Oropharynx and nasopharynx clear.  NECK:  Supple, no jugular venous distention. No thyroid enlargement, no tenderness.  LUNGS: Normal breath sounds bilaterally, no wheezing, rales,rhonchi or crepitation. No use of accessory muscles of respiration.  CARDIOVASCULAR: S1, S2 normal. No murmurs, rubs, or gallops.  ABDOMEN: Soft, nontender, nondistended. Bowel sounds present. No organomegaly or mass.  EXTREMITIES: No pedal edema, cyanosis, or clubbing.  NEUROLOGIC: Cranial nerves II through XII are intact. Muscle strength 5/5 in all extremities. Sensation intact. Gait not checked.  PSYCHIATRIC: The patient is alert and oriented x 3.  SKIN: No obvious rash, lesion, or ulcer.   Data Review     CBC w Diff:  Lab Results  Component Value Date   WBC 7.4 01/31/2017   HGB 12.2 01/31/2017   HGB 12.2 06/02/2016   HCT 38.3 01/31/2017   HCT 39.0 06/02/2016   PLT 238 01/31/2017   PLT 246  06/02/2016   LYMPHOPCT 24 01/31/2017   LYMPHOPCT 24.7 02/01/2014   MONOPCT 13 01/31/2017   MONOPCT  8.1 02/01/2014   EOSPCT 1 01/31/2017   EOSPCT 3.6 02/01/2014   BASOPCT 0 01/31/2017   BASOPCT 0.6 02/01/2014   CMP:  Lab Results  Component Value Date   NA 137 02/01/2017   NA 144 06/02/2016   NA 140 02/01/2014   K 3.3 (L) 02/01/2017   K 3.5 02/01/2014   CL 99 (L) 02/01/2017   CL 105 02/01/2014   CO2 30 02/01/2017   CO2 29 02/01/2014   BUN 9 02/01/2017   BUN 18 06/02/2016   BUN 14 02/01/2014   CREATININE 0.81 02/01/2017   CREATININE 1.04 02/01/2014   PROT 7.4 01/28/2017   PROT 7.3 06/02/2016   PROT 8.6 (H) 02/01/2014   ALBUMIN 3.6 01/28/2017   ALBUMIN 3.8 06/02/2016   ALBUMIN 3.6 02/01/2014   BILITOT 0.8 01/28/2017   BILITOT 0.3 06/02/2016   BILITOT 0.2 02/01/2014   ALKPHOS 79 01/28/2017   ALKPHOS 106 02/01/2014   AST 57 (H) 01/28/2017   AST 22 02/01/2014   ALT 62 (H) 01/28/2017   ALT 22 02/01/2014  .  Micro Results No results found for this or any previous visit (from the past 240 hour(s)).      Code Status Orders        Start     Ordered   01/27/17 1046  Full code  Continuous     01/27/17 1045    Code Status History    Date Active Date Inactive Code Status Order ID Comments User Context   09/12/2015  8:38 AM 09/13/2015  3:57 PM Full Code [id number]  Bettey Costa, MD Inpatient           Contact information for follow-up providers    [name], MD. Go on 02/06/2017.   Specialty:  Internal Medicine Why:  Monday at 11:00am for hospital follow-up Contact information: [place] [id number] [phone]        [facility] Follow up in 2 week(s).   Why:  [phone]; [place] [facility] as new patient for abodominal pain; will contact you with appt. Contact information: [facility] [place], [place]  [id number]            Contact information for after-discharge care     Destination    [facility] .   Specialty:  [place] information: [address] [place] [phone]                  Discharge Medications   Allergies as of 02/02/2017   No Known Allergies     Medication List    STOP taking these medications   ciprofloxacin 250 MG tablet Commonly known as:  CIPRO   omeprazole 20 MG capsule Commonly known as:  PRILOSEC     TAKE these medications   colchicine 0.6 MG tablet Take 1 tablet (0.6 mg total) by mouth 2 (two) times daily as needed (gout pain).   docusate sodium 100 MG capsule Commonly known as:  COLACE Take 1 capsule (100 mg total) by mouth daily as needed.   HYDROcodone-acetaminophen 5-325 MG tablet Commonly known as:  NORCO Take 1 tablet by mouth every 6 (six) hours as needed for moderate pain.   midodrine 5 MG tablet Commonly known as:  PROAMATINE Take 1 tablet (5 mg total) by mouth 3 (three) times daily with meals.   ondansetron 4 MG tablet Commonly known as:  ZOFRAN Take 1 tablet (4 mg total) by mouth every 6 (six) hours as needed for nausea.   pantoprazole 40 MG tablet Commonly known as:  PROTONIX Take 1 tablet (40 mg total) by mouth 2 (two) times daily before a meal.   polyethylene glycol packet Commonly known as:  MIRALAX / GLYCOLAX Take 17 g by mouth daily.   predniSONE 50 MG tablet Commonly known as:  DELTASONE Take 50mg  po daily x  3 days for gout flare   sucralfate 1 GM/10ML suspension Commonly known as:  CARAFATE Take 10 mLs (1 g total) by mouth 4 (four) times daily -  with meals and at bedtime.          Total Time in preparing paper work, data evaluation and todays exam - 35 minutes  Dustin Flock M.D on 02/02/2017 at 11:33 AM  [facility]   Office  [phone]

## 2017-02-02 NOTE — Progress Notes (Signed)
Patient taken to [facility] by EMS. Patient is alert to self and is aware of being transferred to [facility] Family at the bedside no acute distress noted.

## 2017-02-02 NOTE — Plan of Care (Signed)
Problem: Activity: Goal: Risk for activity intolerance will decrease Outcome: Not Progressing Patient is not ambulatory due to weakness.  Problem: Fluid Volume: Goal: Ability to maintain a balanced intake and output will improve Outcome: Not Progressing Patient has poor appetite.  Problem: Nutrition: Goal: Adequate nutrition will be maintained Outcome: Not Progressing Patient has poor appetite.

## 2017-02-02 NOTE — Progress Notes (Signed)
Patient discharged to [facility]. Patient is alert and oriented to self and place. Patient is developmentally delayed. Patient family at the bedside. No acute distress noted. Report called to Fara Olden LPN at [facility]

## 2017-02-02 NOTE — Clinical Social Work Note (Signed)
Pt is ready for discharge today and will return to [facility]. Facility is ready for pt today as they have received discharge information. Pt and family are aware and agreeable to discharge plan. RN will call report. [facility] will provide transportation. CSW is signing off as no further needs identified.   Darden Dates, MSW, LCSW Clinical Social Worker  [phone]

## 2017-02-02 NOTE — Progress Notes (Signed)
SLP Cancellation Note  Patient Details Name: Taylor Johnson MRN: [id number] DOB: 07-29-1945   Progress Note: chart reviewed; consulted w/ NSG then met w/ pt and family present in room. Pt consumed a few sips of thin liquids w/ no overt s/s of aspiration noted while present. Family and pt denied any swallowing deficits but pt endorsed abdominal discomfort which she has been experiencing for the past few days; NSG aware.  Briefly discussed general aspiration precautions recommended w/ any oral intake; family has Reflux precautions handouts.  No further ST services indicated at this time as pt appears at her baseline w/ swallowing. Recommend GI f/u for any discomfort post discharge. Family and pt agreed.   Orinda Kenner, [place], CCC-SLP Kasee Hantz 02/02/2017, 2:52 PM

## 2017-02-04 DIAGNOSIS — R1310 Dysphagia, unspecified: Secondary | ICD-10-CM | POA: Diagnosis not present

## 2017-02-04 DIAGNOSIS — I951 Orthostatic hypotension: Secondary | ICD-10-CM | POA: Diagnosis not present

## 2017-02-04 DIAGNOSIS — R1033 Periumbilical pain: Secondary | ICD-10-CM | POA: Diagnosis not present

## 2017-02-04 DIAGNOSIS — M109 Gout, unspecified: Secondary | ICD-10-CM | POA: Diagnosis not present

## 2017-02-04 DIAGNOSIS — R112 Nausea with vomiting, unspecified: Secondary | ICD-10-CM | POA: Diagnosis not present

## 2017-02-06 ENCOUNTER — Ambulatory Visit (INDEPENDENT_AMBULATORY_CARE_PROVIDER_SITE_OTHER): Payer: Medicare Other | Admitting: Internal Medicine

## 2017-02-06 ENCOUNTER — Encounter: Payer: Self-pay | Admitting: Internal Medicine

## 2017-02-06 VITALS — BP 138/72 | HR 57 | Ht 64.0 in

## 2017-02-06 DIAGNOSIS — R1084 Generalized abdominal pain: Secondary | ICD-10-CM

## 2017-02-06 DIAGNOSIS — K5730 Diverticulosis of large intestine without perforation or abscess without bleeding: Secondary | ICD-10-CM

## 2017-02-06 DIAGNOSIS — R600 Localized edema: Secondary | ICD-10-CM | POA: Diagnosis not present

## 2017-02-06 DIAGNOSIS — R5381 Other malaise: Secondary | ICD-10-CM | POA: Diagnosis not present

## 2017-02-06 DIAGNOSIS — R6250 Unspecified lack of expected normal physiological development in childhood: Secondary | ICD-10-CM | POA: Insufficient documentation

## 2017-02-06 NOTE — Progress Notes (Signed)
Date:  02/06/2017   Name:  Taylor Johnson   DOB:  Nov 22, 1945   MRN:  [id number]  Admitted to [facility] 7/13 - 02/02/17 for abdominal pain. Chief Complaint: Hospitalization Follow-up (Pt states she is still not feeling well. Pt seeing [facility].  ) Abdominal Pain  This is a chronic problem. The current episode started more than 1 month ago. The problem occurs daily. The problem has been gradually improving. The pain is located in the generalized abdominal region. The pain is mild. The quality of the pain is colicky. Pertinent negatives include no constipation, diarrhea, fever or headaches. Prior diagnostic workup includes CT scan. Her past medical history is significant for gallstones.  Hospital workup was unclear as to exact etiology - she has a gall stone, extensive diverticulosis of colon and some swallowing issues.  Edema - more swelling in both feet.  Being treated for gout but not helping.  Eating a regular diet but does not add salt to foods.  Not elevating legs during the day - mostly sitting in the chair.  Deconditioning - supposed to be exercising with PT but has gotten off to a rocky start.  Her sister is asking them the push a bit harder.   Review of Systems  Constitutional: Negative for chills, fatigue and fever.  Respiratory: Negative for chest tightness and shortness of breath.   Cardiovascular: Positive for leg swelling. Negative for chest pain and palpitations.  Gastrointestinal: Positive for abdominal pain. Negative for blood in stool, constipation and diarrhea.  Neurological: Negative for dizziness and headaches.  Psychiatric/Behavioral: Negative for dysphoric mood.    Patient Active Problem List   Diagnosis Date Noted  . Nausea and vomiting 01/29/2017  . Abdominal pain 01/27/2017  . Gall stones 01/20/2017  . Cough 06/02/2016  . Localized edema 04/15/2016  . Microscopic hematuria 04/15/2016  . OSA on CPAP 11/18/2015  . Dysphagia 10/15/2015  . Hypoxia 09/12/2015  .  Urge incontinence of urine 05/12/2015  . Diverticulosis of colon 05/11/2015  . Anxiety, generalized 05/11/2015       Outpatient Encounter Prescriptions as of 02/06/2017  Medication Sig  . colchicine 0.6 MG tablet Take 0.6 mg by mouth daily.  [name] docusate sodium (COLACE) 100 MG capsule Take 100 mg by mouth 2 (two) times daily.  [name] HYDROcodone-acetaminophen (NORCO) 5-325 MG tablet Take 1 tablet by mouth every 6 (six) hours as needed for moderate pain.  . midodrine (PROAMATINE) 5 MG tablet Take 5 mg by mouth 3 (three) times daily with meals.  . ondansetron (ZOFRAN) 4 MG tablet Take 1 tablet (4 mg total) by mouth every 6 (six) hours as needed for nausea.  . pantoprazole (PROTONIX) 40 MG tablet Take 40 mg by mouth daily.  . sucralfate (CARAFATE) 1 GM/10ML suspension Take 1 g by mouth 4 (four) times daily -  with meals and at bedtime.  . [DISCONTINUED] colchicine 0.6 MG tablet Take 1 tablet (0.6 mg total) by mouth 2 (two) times daily as needed (gout pain).  . [DISCONTINUED] docusate sodium (COLACE) 100 MG capsule Take 1 capsule (100 mg total) by mouth daily as needed.  . [DISCONTINUED] midodrine (PROAMATINE) 5 MG tablet Take 1 tablet (5 mg total) by mouth 3 (three) times daily with meals. (Patient not taking: Reported on 02/06/2017)  . [DISCONTINUED] pantoprazole (PROTONIX) 40 MG tablet Take 1 tablet (40 mg total) by mouth 2 (two) times daily before a meal. (Patient not taking: Reported on 02/06/2017)  . [DISCONTINUED] polyethylene glycol (MIRALAX / GLYCOLAX)  packet Take 17 g by mouth daily. (Patient not taking: Reported on 02/06/2017)  . [DISCONTINUED] sucralfate (CARAFATE) 1 GM/10ML suspension Take 10 mLs (1 g total) by mouth 4 (four) times daily -  with meals and at bedtime. (Patient not taking: Reported on 02/06/2017)   No facility-administered encounter medications on file as of 02/06/2017.      No Known Allergies  Past Surgical History:  Procedure Laterality Date  . ABDOMINAL HYSTERECTOMY    .  COLON SURGERY    . ESOPHAGOGASTRODUODENOSCOPY (EGD) WITH PROPOFOL N/A 01/29/2017   Procedure: ESOPHAGOGASTRODUODENOSCOPY (EGD) WITH PROPOFOL;  Surgeon: Wilford Corner, MD;  Location: [facility] ENDOSCOPY;  Service: Endoscopy;  Laterality: N/A;    Social History  Substance Use Topics  . Smoking status: Never Smoker  . Smokeless tobacco: Never Used  . Alcohol use No     Medication list has been reviewed and updated.   Physical Exam  Constitutional: She appears well-developed. No distress.  HENT:  Head: Normocephalic and atraumatic.  Neck: Normal range of motion. No thyromegaly present.  Cardiovascular: Normal rate, regular rhythm and normal heart sounds.   Pulmonary/Chest: Effort normal and breath sounds normal. No respiratory distress. She has no wheezes.  Abdominal: Soft. Bowel sounds are normal. There is no tenderness.  Musculoskeletal: She exhibits edema.  Neurological: She is alert.  Skin: Skin is warm and dry. No rash noted.  Psychiatric: She has a normal mood and affect.  Nursing note and vitals reviewed.   BP 138/72   Pulse (!) 57   Ht 5\' 4"  (1.626 m)   SpO2 98%   Assessment and Plan: 1. Generalized abdominal pain Improved; continue PPI and carafate Awaiting GI appt  2. Localized edema Consider compression stockings and elevation  3. Diverticulosis of colon stable  4. Physical deconditioning Continue PT eval and treat at [facility]  No orders of the defined types were placed in this encounter.   Halina Maidens, MD [facility]  02/06/2017

## 2017-02-13 DIAGNOSIS — R112 Nausea with vomiting, unspecified: Secondary | ICD-10-CM | POA: Diagnosis not present

## 2017-02-13 DIAGNOSIS — M109 Gout, unspecified: Secondary | ICD-10-CM | POA: Diagnosis not present

## 2017-02-13 DIAGNOSIS — I951 Orthostatic hypotension: Secondary | ICD-10-CM | POA: Diagnosis not present

## 2017-02-13 DIAGNOSIS — K219 Gastro-esophageal reflux disease without esophagitis: Secondary | ICD-10-CM | POA: Diagnosis not present

## 2017-02-13 DIAGNOSIS — R1013 Epigastric pain: Secondary | ICD-10-CM | POA: Diagnosis not present

## 2017-02-17 ENCOUNTER — Telehealth: Payer: Self-pay

## 2017-02-17 ENCOUNTER — Other Ambulatory Visit: Payer: Self-pay

## 2017-02-17 DIAGNOSIS — R1084 Generalized abdominal pain: Secondary | ICD-10-CM

## 2017-02-17 NOTE — Telephone Encounter (Signed)
[facility] called stating they are having difficulty getting patient into [facility] because pt's insurance will not approve it. Needs [name] to send in referral for pt to get patient in wherever insurance will cover her. Sent referral in for GI for generalized abdominal pain.

## 2017-02-20 ENCOUNTER — Telehealth: Payer: Self-pay

## 2017-02-20 NOTE — Telephone Encounter (Signed)
Spoke to [place] who is Peola's sister. - She states [facility] is [facility] will be there for 20 days. (She was admitted there on 02/03/2017). Patient was having trouble breathing while sleeping so [facility] started [facility] using a CP[facility] machine. Patient is using machine well and sleeping/breathing better. Wants order sent for a CPAP so she can use one at home when discharged. Dr Army Melia and I did some digging in pt's chart - found the study was ordered through Dr Charolett Bumpers at [facility]. Zola Button she would need to contact there office to find out where sleep study was done so they can send order over for pt to get signed to receive sleep study. [name] verbalized understanding.

## 2017-02-20 NOTE — Telephone Encounter (Signed)
Taylor Johnson from [employer] called They want an order for CPAP machine to be signed and sentt o [facility] as part of patients discharge later this week, Last Sleep study has been less than a year ago and we did this once and got her set up and she refused to wear machine so medicaid or medicare refused to allow her to use CPAP. She states to [facility] that she will now use this daily and wants to have another chance. Send order to fax number [phone] attn [name] or call [facility] and talk to her.

## 2017-02-22 ENCOUNTER — Other Ambulatory Visit: Payer: Self-pay | Admitting: *Deleted

## 2017-02-22 NOTE — Patient Outreach (Signed)
[facility] ([facility]) Care Management  02/22/2017  Taylor Johnson 01/01/1946 [id number]   Met with [name], SW at facility. 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   Met with patient, her 2 sisters and her mother at facility. RNCM reviewed in detail [facility] care management services and the differences between [facility] and home care. RNCM Left [facility] packet with RNCM contact for sister to review. Sister reports they may discuss [facility] program with PCP, Dr. Berglund. RNCM encouraged them to review packet and discuss with MD.   Plan to sign off at this time. Patient has [facility] packet and information.  E. , RN, BSN, CCM  Post Acute Care Coordinator [facility] ([phone]) Business Cell  ([phone]) Toll Free Office 

## 2017-02-25 DIAGNOSIS — R1030 Lower abdominal pain, unspecified: Secondary | ICD-10-CM | POA: Diagnosis not present

## 2017-02-25 DIAGNOSIS — R42 Dizziness and giddiness: Secondary | ICD-10-CM | POA: Diagnosis not present

## 2017-02-25 DIAGNOSIS — I951 Orthostatic hypotension: Secondary | ICD-10-CM | POA: Diagnosis not present

## 2017-02-25 DIAGNOSIS — K5730 Diverticulosis of large intestine without perforation or abscess without bleeding: Secondary | ICD-10-CM | POA: Diagnosis not present

## 2017-02-25 DIAGNOSIS — M6281 Muscle weakness (generalized): Secondary | ICD-10-CM | POA: Diagnosis not present

## 2017-03-06 ENCOUNTER — Other Ambulatory Visit: Payer: Self-pay

## 2017-03-10 DIAGNOSIS — I951 Orthostatic hypotension: Secondary | ICD-10-CM | POA: Diagnosis not present

## 2017-03-10 DIAGNOSIS — Z9181 History of falling: Secondary | ICD-10-CM | POA: Diagnosis not present

## 2017-03-10 DIAGNOSIS — M6281 Muscle weakness (generalized): Secondary | ICD-10-CM | POA: Diagnosis not present

## 2017-03-10 DIAGNOSIS — K8020 Calculus of gallbladder without cholecystitis without obstruction: Secondary | ICD-10-CM | POA: Diagnosis not present

## 2017-03-10 DIAGNOSIS — K219 Gastro-esophageal reflux disease without esophagitis: Secondary | ICD-10-CM | POA: Diagnosis not present

## 2017-03-10 DIAGNOSIS — G4733 Obstructive sleep apnea (adult) (pediatric): Secondary | ICD-10-CM | POA: Diagnosis not present

## 2017-03-10 DIAGNOSIS — K314 Gastric diverticulum: Secondary | ICD-10-CM | POA: Diagnosis not present

## 2017-03-10 DIAGNOSIS — R1312 Dysphagia, oropharyngeal phase: Secondary | ICD-10-CM | POA: Diagnosis not present

## 2017-03-10 DIAGNOSIS — M10072 Idiopathic gout, left ankle and foot: Secondary | ICD-10-CM | POA: Diagnosis not present

## 2017-03-10 DIAGNOSIS — K5730 Diverticulosis of large intestine without perforation or abscess without bleeding: Secondary | ICD-10-CM | POA: Diagnosis not present

## 2017-03-11 DIAGNOSIS — I951 Orthostatic hypotension: Secondary | ICD-10-CM | POA: Diagnosis not present

## 2017-03-11 DIAGNOSIS — R1312 Dysphagia, oropharyngeal phase: Secondary | ICD-10-CM | POA: Diagnosis not present

## 2017-03-11 DIAGNOSIS — M10072 Idiopathic gout, left ankle and foot: Secondary | ICD-10-CM | POA: Diagnosis not present

## 2017-03-11 DIAGNOSIS — K8020 Calculus of gallbladder without cholecystitis without obstruction: Secondary | ICD-10-CM | POA: Diagnosis not present

## 2017-03-11 DIAGNOSIS — K314 Gastric diverticulum: Secondary | ICD-10-CM | POA: Diagnosis not present

## 2017-03-11 DIAGNOSIS — G4733 Obstructive sleep apnea (adult) (pediatric): Secondary | ICD-10-CM | POA: Diagnosis not present

## 2017-03-11 DIAGNOSIS — K5730 Diverticulosis of large intestine without perforation or abscess without bleeding: Secondary | ICD-10-CM | POA: Diagnosis not present

## 2017-03-11 DIAGNOSIS — M6281 Muscle weakness (generalized): Secondary | ICD-10-CM | POA: Diagnosis not present

## 2017-03-11 DIAGNOSIS — K219 Gastro-esophageal reflux disease without esophagitis: Secondary | ICD-10-CM | POA: Diagnosis not present

## 2017-03-11 DIAGNOSIS — Z9181 History of falling: Secondary | ICD-10-CM | POA: Diagnosis not present

## 2017-03-13 ENCOUNTER — Telehealth: Payer: Self-pay

## 2017-03-13 DIAGNOSIS — Z9181 History of falling: Secondary | ICD-10-CM | POA: Diagnosis not present

## 2017-03-13 DIAGNOSIS — K8020 Calculus of gallbladder without cholecystitis without obstruction: Secondary | ICD-10-CM | POA: Diagnosis not present

## 2017-03-13 DIAGNOSIS — M6281 Muscle weakness (generalized): Secondary | ICD-10-CM | POA: Diagnosis not present

## 2017-03-13 DIAGNOSIS — K314 Gastric diverticulum: Secondary | ICD-10-CM | POA: Diagnosis not present

## 2017-03-13 DIAGNOSIS — R1312 Dysphagia, oropharyngeal phase: Secondary | ICD-10-CM | POA: Diagnosis not present

## 2017-03-13 DIAGNOSIS — K219 Gastro-esophageal reflux disease without esophagitis: Secondary | ICD-10-CM | POA: Diagnosis not present

## 2017-03-13 DIAGNOSIS — M10072 Idiopathic gout, left ankle and foot: Secondary | ICD-10-CM | POA: Diagnosis not present

## 2017-03-13 DIAGNOSIS — G4733 Obstructive sleep apnea (adult) (pediatric): Secondary | ICD-10-CM | POA: Diagnosis not present

## 2017-03-13 DIAGNOSIS — I951 Orthostatic hypotension: Secondary | ICD-10-CM | POA: Diagnosis not present

## 2017-03-13 DIAGNOSIS — K5730 Diverticulosis of large intestine without perforation or abscess without bleeding: Secondary | ICD-10-CM | POA: Diagnosis not present

## 2017-03-13 NOTE — Telephone Encounter (Signed)
Verbal given for [facility] and PT per Dr.Berglund.

## 2017-03-14 ENCOUNTER — Telehealth: Payer: Self-pay

## 2017-03-14 DIAGNOSIS — G4733 Obstructive sleep apnea (adult) (pediatric): Secondary | ICD-10-CM | POA: Diagnosis not present

## 2017-03-14 DIAGNOSIS — K5730 Diverticulosis of large intestine without perforation or abscess without bleeding: Secondary | ICD-10-CM | POA: Diagnosis not present

## 2017-03-14 DIAGNOSIS — M10072 Idiopathic gout, left ankle and foot: Secondary | ICD-10-CM | POA: Diagnosis not present

## 2017-03-14 DIAGNOSIS — K219 Gastro-esophageal reflux disease without esophagitis: Secondary | ICD-10-CM | POA: Diagnosis not present

## 2017-03-14 DIAGNOSIS — K314 Gastric diverticulum: Secondary | ICD-10-CM | POA: Diagnosis not present

## 2017-03-14 DIAGNOSIS — Z9181 History of falling: Secondary | ICD-10-CM | POA: Diagnosis not present

## 2017-03-14 DIAGNOSIS — R1312 Dysphagia, oropharyngeal phase: Secondary | ICD-10-CM | POA: Diagnosis not present

## 2017-03-14 DIAGNOSIS — K8020 Calculus of gallbladder without cholecystitis without obstruction: Secondary | ICD-10-CM | POA: Diagnosis not present

## 2017-03-14 DIAGNOSIS — M6281 Muscle weakness (generalized): Secondary | ICD-10-CM | POA: Diagnosis not present

## 2017-03-14 DIAGNOSIS — I951 Orthostatic hypotension: Secondary | ICD-10-CM | POA: Diagnosis not present

## 2017-03-14 NOTE — Telephone Encounter (Signed)
Stephanie Horvatch called from [facility] requesting verbal orders for Patient to receive occupational therapy 2 X a week for 4 weeks. I gave verbal approval over phone.

## 2017-03-15 ENCOUNTER — Ambulatory Visit (INDEPENDENT_AMBULATORY_CARE_PROVIDER_SITE_OTHER): Payer: Medicare Other | Admitting: Gastroenterology

## 2017-03-15 ENCOUNTER — Encounter: Payer: Self-pay | Admitting: Gastroenterology

## 2017-03-15 ENCOUNTER — Encounter (INDEPENDENT_AMBULATORY_CARE_PROVIDER_SITE_OTHER): Payer: Self-pay

## 2017-03-15 ENCOUNTER — Other Ambulatory Visit: Payer: Self-pay

## 2017-03-15 VITALS — BP 132/84 | HR 92 | Temp 98.7°F | Ht 64.0 in | Wt 238.6 lb

## 2017-03-15 DIAGNOSIS — R1319 Other dysphagia: Secondary | ICD-10-CM | POA: Diagnosis not present

## 2017-03-15 MED ORDER — METRONIDAZOLE 500 MG PO TABS: 500.0000 mg | ORAL_TABLET | Freq: Two times a day (BID) | ORAL | 0 refills | Status: DC

## 2017-03-15 NOTE — Progress Notes (Signed)
[name], MD [address]  [place]  [place], [place] [address]  Main: [phone]  Fax: [phone]    Gastroenterology Consultation  Referring Provider:     [name], MD Primary Care Physician:  [name], MD Primary Gastroenterologist:  Dr. [name] Reason for Consultation:     Difficulty swallowing        HPI:   Taylor Johnson is a 71 y.o. y/o female referred by Dr. Army Melia, Jesse Sans, MD  for consultation & management of Abdominal pain and difficulty swallowing. She has known history of severe pandiverticulosis and was recently admitted to [facility] about a month ago with recurrent nausea and vomiting, abdominal pain, CT angiogram of the abdomen did not reveal any bleeding source. There was no evidence of diverticulitis. She was seen by GI, underwent EGD and it was unremarkable. She had a normal barium swallow in March 2017. She was discharged to rehabilitation and subsequently home now for. Per her sister, she has lost about 30 pounds in last 2 months due to nausea, vomiting, abdominal pain and difficulty swallowing solid foods. She is also deconditioned since her discharge and is currently undergoing PT, patient thinks her energy levels are coming back up slowly. She is currently eating only mashed foods. Her sister reports that she has been coughing when she tries to eat solid food, and she feels like food is stuck in her throat. She denies upper abdominal pain, heartburn. She is not having episodes of nausea and vomiting anymore.  With regards to her abdominal pain, patient reports that it is worse at night. She is having one to 2 soft bowel movements daily. She has severe pandiverticulosis based on an endoscopy and abdominal imaging. Denies any blood in the stool. Per her sister, she had several episodes of diverticulitis in the past as well as diverticular bleeding needing blood transfusions.  GI Procedures: EGD 01/2017  normal. Colonoscopy 04/2014 showed diverticulosis. Complete report not available and patient reports that it was done at [facility]  Past Medical History:  Diagnosis Date  . Anxiety   . Anxiety, generalized 05/11/2015  . Asthma   . Cough 06/02/2016   Chronic - followed by Pulmonary  . Depression   . Developmental delay   . Diverticulitis   . Diverticulosis of colon 05/11/2015  . Dysphagia 10/15/2015   Routine Ba Swallow normal; rec modified barium swallow   . Hypoxia 09/12/2015  . Leg swelling   . Localized edema 04/15/2016  . Microscopic hematuria 04/15/2016  . OSA on CPAP 11/18/2015   CPAP @ 18 cm H2O started 02/2016  . Urge incontinence of urine 05/12/2015    Past Surgical History:  Procedure Laterality Date  . ABDOMINAL HYSTERECTOMY    . COLON SURGERY    . ESOPHAGOGASTRODUODENOSCOPY (EGD) WITH PROPOFOL N/A 01/29/2017   Procedure: ESOPHAGOGASTRODUODENOSCOPY (EGD) WITH PROPOFOL;  Surgeon: Wilford Corner, MD;  Location: [facility] ENDOSCOPY;  Service: Endoscopy;  Laterality: N/A;    Prior to Admission medications   Medication Sig Start Date End Date Taking? Authorizing Provider  colchicine 0.6 MG tablet Take 0.6 mg by mouth daily.    [provider]  docusate sodium (COLACE) 100 MG capsule Take 100 mg by mouth 2 (two) times daily.    [provider]  HYDROcodone-acetaminophen (NORCO) 5-325 MG tablet Take 1 tablet by mouth every 6 (six) hours as needed for moderate pain. 01/31/17   Dustin Flock, MD  midodrine ([place]) 5  MG tablet Take 5 mg by mouth 3 (three) times daily with meals.    [provider]  ondansetron (ZOFRAN) 4 MG tablet Take 1 tablet (4 mg total) by mouth every 6 (six) hours as needed for nausea. 01/31/17   Dustin Flock, MD  pantoprazole (PROTONIX) 40 MG tablet Take 40 mg by mouth daily.    [provider]  sucralfate (CARAFATE) 1 GM/10ML suspension Take 1 g by mouth 4 (four) times daily -  with meals and at bedtime.     [provider]    Family History  Problem Relation Age of Onset  . Hypertension Mother   . Kidney disease Mother   . Bladder Cancer Neg Hx      Social History  Substance Use Topics  . Smoking status: Never Smoker  . Smokeless tobacco: Never Used  . Alcohol use No    Allergies as of 03/15/2017  . (No Known Allergies)    Review of Systems:    All systems reviewed and negative except where noted in HPI.   Physical Exam:  There were no vitals taken for this visit. No LMP recorded. Patient has had a hysterectomy.  General:   Alert,  Sitting in wheelchair, Well-developed, well-nourished, pleasant and cooperative in NAD Head:  Normocephalic and atraumatic. Eyes:  Sclera clear, no icterus.   Conjunctiva pink. Ears:  Normal auditory acuity. Lungs:  Respirations even and unlabored.  Clear throughout to auscultation.   No wheezes, crackles, or rhonchi. No acute distress. Heart:  Regular rate and rhythm; no murmurs, clicks, rubs, or gallops. Abdomen:  Normal bowel sounds. Obese Soft, non-tender and non-distended without masses, no appreciable hepatosplenomegaly or hernias noted.  No guarding or rebound tenderness.   Rectal: Nor performed Msk:  Symmetrical without gross deformities. Good, equal movement & strength bilaterally. Pulses:  Normal pulses noted. Extremities:  No clubbing or edema.  No cyanosis. Neurologic:  Alert and oriented x3; Skin:  Intact without significant lesions or rashes. No jaundice. Psych:  Alert and cooperative. Normal mood and affect.  Imaging Studies: Reviewed  Assessment and Plan:   Taylor Johnson is a 71 y.o. y/o female with abdominal pain most likely secondary to diverticulosis and she may have segmental colitis associated with diverticulosis causing abdominal pain. I will try Flagyl 500 mg twice daily for 1 week. With regards to difficulty swallowing, EGD was unremarkable. I will refer her to speech pathology for modified barium swallow to  evaluate for any oropharyngeal cause of dysphagia. I'll try to obtain her colonoscopy report from [facility].  Follow up based on the above tests.   [name], MD

## 2017-03-16 ENCOUNTER — Encounter: Payer: Self-pay | Admitting: Internal Medicine

## 2017-03-16 ENCOUNTER — Ambulatory Visit (INDEPENDENT_AMBULATORY_CARE_PROVIDER_SITE_OTHER): Payer: Medicare Other | Admitting: Internal Medicine

## 2017-03-16 VITALS — BP 140/82 | HR 96 | Wt 235.6 lb

## 2017-03-16 DIAGNOSIS — B354 Tinea corporis: Secondary | ICD-10-CM | POA: Diagnosis not present

## 2017-03-16 DIAGNOSIS — K5730 Diverticulosis of large intestine without perforation or abscess without bleeding: Secondary | ICD-10-CM | POA: Diagnosis not present

## 2017-03-16 DIAGNOSIS — Z9989 Dependence on other enabling machines and devices: Secondary | ICD-10-CM | POA: Diagnosis not present

## 2017-03-16 DIAGNOSIS — G4733 Obstructive sleep apnea (adult) (pediatric): Secondary | ICD-10-CM

## 2017-03-16 DIAGNOSIS — Z23 Encounter for immunization: Secondary | ICD-10-CM | POA: Diagnosis not present

## 2017-03-16 DIAGNOSIS — K219 Gastro-esophageal reflux disease without esophagitis: Secondary | ICD-10-CM | POA: Diagnosis not present

## 2017-03-16 DIAGNOSIS — R1310 Dysphagia, unspecified: Secondary | ICD-10-CM | POA: Diagnosis not present

## 2017-03-16 DIAGNOSIS — K314 Gastric diverticulum: Secondary | ICD-10-CM | POA: Diagnosis not present

## 2017-03-16 DIAGNOSIS — R600 Localized edema: Secondary | ICD-10-CM | POA: Diagnosis not present

## 2017-03-16 DIAGNOSIS — M10072 Idiopathic gout, left ankle and foot: Secondary | ICD-10-CM | POA: Diagnosis not present

## 2017-03-16 DIAGNOSIS — I951 Orthostatic hypotension: Secondary | ICD-10-CM | POA: Diagnosis not present

## 2017-03-16 DIAGNOSIS — Z9181 History of falling: Secondary | ICD-10-CM | POA: Diagnosis not present

## 2017-03-16 DIAGNOSIS — K8020 Calculus of gallbladder without cholecystitis without obstruction: Secondary | ICD-10-CM | POA: Diagnosis not present

## 2017-03-16 DIAGNOSIS — R1312 Dysphagia, oropharyngeal phase: Secondary | ICD-10-CM | POA: Diagnosis not present

## 2017-03-16 DIAGNOSIS — M6281 Muscle weakness (generalized): Secondary | ICD-10-CM | POA: Diagnosis not present

## 2017-03-16 HISTORY — DX: Orthostatic hypotension: I95.1

## 2017-03-16 MED ORDER — ESOMEPRAZOLE MAGNESIUM 40 MG PO CPDR: 40.0000 mg | DELAYED_RELEASE_CAPSULE | Freq: Every day | ORAL | 5 refills | Status: DC

## 2017-03-16 MED ORDER — MIDODRINE HCL 5 MG PO TABS: 5.0000 mg | ORAL_TABLET | Freq: Two times a day (BID) | ORAL | 0 refills | Status: DC

## 2017-03-16 NOTE — Patient Instructions (Signed)
Stop Pantoprazole and start Esomeprazole - open capsule and sprinkle on food  Reduce dose of Midodrine to twice a day  Topical powder under arms until healed

## 2017-03-16 NOTE — Progress Notes (Signed)
Date:  03/16/2017   Name:  Taylor Johnson   DOB:  10-11-1945   MRN:  [id number]   Chief Complaint: rehab follow up (Having BM without realizing they are happening. This morning was taking a bath and passing a BM - did not know she was doing it. Taking Flagyl for 1 week per GI); Recurrent Skin Infections (At [facility] they noticed boils under pt's underarms. They gave her powder which relieved them. But now are back. ); and Dizziness (Been having this on and off. ) Dizziness  The current episode started more than 1 month ago. The problem occurs daily. The problem has been unchanged. Associated symptoms include abdominal pain and a rash. Pertinent negatives include no chest pain, chills, coughing, diaphoresis or headaches. Exacerbated by: worse with sitting up, no n/v. She has tried nothing for the symptoms.  Abdominal Pain  This is a chronic (seen by GI - felt to have colitis or diverticulosis and started yesterday on Flagyl) problem. Pertinent negatives include no headaches. Associated symptoms comments: Had episode of incontinence this AM.  Rash  This is a new problem. The current episode started 1 to 4 weeks ago. The problem has been waxing and waning since onset. Location: axilla. Pertinent negatives include no cough. Treatments tried: nystatin powder working well.  Dysphagia - and has been on Protonix 40 mg but according to her sister she is unable to swallow it. They dissolve most of her medications and water and then people drink it. She cannot get the Protonix to dissolve however she does continue Carafate before meals which has helped. GI has ordered a speech therapy/swallowing evaluation since her other studies do not reveal a cause of dysphagia. According to her sister [name] she is only able to eat very soft foods or chopped meats. Otherwise the food is regurgitated. OSA - patient was able to use his CPAP while she was in rehabilitation and did well. She is now willing to use it at home if we can  get a reordered. [facility]. Her last sleep study was in 5 2017 with possible she may need a second one. We are currently waiting to hear back.   Review of Systems  Constitutional: Positive for unexpected weight change (40 lbs). Negative for chills and diaphoresis.  HENT: Positive for trouble swallowing. Negative for ear pain, facial swelling and sinus pressure.   Eyes: Negative for visual disturbance.  Respiratory: Negative for cough and chest tightness.   Cardiovascular: Negative for chest pain, palpitations and leg swelling.  Gastrointestinal: Positive for abdominal pain.  Skin: Positive for rash.  Neurological: Positive for dizziness. Negative for tremors, syncope and headaches.  Psychiatric/Behavioral: Negative for sleep disturbance.    Patient Active Problem List   Diagnosis Date Noted  . Developmental delay disorder 02/06/2017  . Nausea and vomiting 01/29/2017  . Abdominal pain 01/27/2017  . Calculus of gallbladder without cholecystitis without obstruction 01/20/2017  . Cough 06/02/2016  . Localized edema 04/15/2016  . Microscopic hematuria 04/15/2016  . OSA on CPAP 11/18/2015  . Dysphagia 10/15/2015  . Hypoxia 09/12/2015  . Urge incontinence of urine 05/12/2015  . Diverticulosis of colon 05/11/2015  . Anxiety, generalized 05/11/2015    Prior to Admission medications   Medication Sig Start Date End Date Taking? Authorizing Provider  chlorthalidone (HYGROTON) 25 MG tablet Take 25 mg by mouth daily.   Yes [provider]  colchicine 0.6 MG tablet Take 0.6 mg by  mouth daily.   Yes [provider]  docusate sodium (COLACE) 100 MG capsule Take 100 mg by mouth 2 (two) times daily.   Yes [provider]  HYDROcodone-acetaminophen (NORCO) 5-325 MG tablet Take 1 tablet by mouth every 6 (six) hours as needed for moderate pain. 01/31/17  Yes Dustin Flock, MD  hyoscyamine (LEVSIN) 0.125 MG/ML solution Take  0.125 mg by mouth every 4 (four) hours as needed.   Yes [provider]  metroNIDAZOLE (FLAGYL) 500 MG tablet Take 1 tablet (500 mg total) by mouth 2 (two) times daily. 03/15/17  Yes Vanga, Tally Due, MD  midodrine (PROAMATINE) 5 MG tablet Take 5 mg by mouth 3 (three) times daily with meals.   Yes [provider]  ondansetron (ZOFRAN) 4 MG tablet Take 1 tablet (4 mg total) by mouth every 6 (six) hours as needed for nausea. 01/31/17  Yes Dustin Flock, MD  pantoprazole (PROTONIX) 40 MG tablet Take 40 mg by mouth daily.   Yes [provider]  potassium chloride SA (K-DUR,KLOR-CON) 20 MEQ tablet Take 20 mEq by mouth 2 (two) times daily.   Yes [provider]    No Known Allergies  Past Surgical History:  Procedure Laterality Date  . ABDOMINAL HYSTERECTOMY    . COLON SURGERY    . ESOPHAGOGASTRODUODENOSCOPY (EGD) WITH PROPOFOL N/A 01/29/2017   Procedure: ESOPHAGOGASTRODUODENOSCOPY (EGD) WITH PROPOFOL;  Surgeon: Wilford Corner, MD;  Location: [facility] ENDOSCOPY;  Service: Endoscopy;  Laterality: N/A;    Social History  Substance Use Topics  . Smoking status: Never Smoker  . Smokeless tobacco: Never Used  . Alcohol use No    Medication list has been reviewed and updated.  PHQ 2/9 Scores 06/02/2016 05/12/2015  PHQ - 2 Score 0 2  PHQ- 9 Score - 4    Physical Exam  Constitutional: She is oriented to person, place, and time. She appears well-developed and well-nourished. No distress.  HENT:  Head: Normocephalic and atraumatic.  Eyes: Pupils are equal, round, and reactive to light. Conjunctivae are normal.  Neck: Normal range of motion. No thyromegaly present.  Cardiovascular: Normal rate, regular rhythm and normal heart sounds.   Pulmonary/Chest: Effort normal and breath sounds normal. No respiratory distress.  Abdominal: Soft. Bowel sounds are normal. There is no tenderness.  Musculoskeletal: She exhibits no edema.  Neurological: She is alert and  oriented to person, place, and time.  Unable to perform finger to nose   Skin: Skin is warm and dry. Rash noted.  Dark skin in axilla with subcut nodules < 3 mm size  Psychiatric: She has a normal mood and affect. Her behavior is normal. Thought content normal.  Nursing note and vitals reviewed.   BP 140/82   Pulse 96   Wt 235 lb 9.6 oz (106.9 kg)   SpO2 97%   BMI 40.44 kg/m   Assessment and Plan: 1. OSA on CPAP Waiting for approval from [facility]  2. Dysphagia, unspecified type Stop pantoprazole and begin nexium caps to sprinkle - esomeprazole (NEXIUM) 40 MG capsule; Take 1 capsule (40 mg total) by mouth daily at 12 noon.  Dispense: 30 capsule; Refill: 5  3. Diverticulosis of colon On Flagyl from GI  4. Localized edema Much improved with diuretic and weight loss  5. Orthostatic hypotension Reduce dose of midodrine to twice a day - midodrine (PROAMATINE) 5 MG tablet; Take 1 tablet (5 mg total) by mouth 2 (two) times daily with a meal.  Dispense: 60 tablet; Refill: 0  6. Tinea corporis Continue nystatin powder  7. Need for influenza vaccination - Flu Vaccine QUAD 36+ mos IM   Meds ordered this encounter  Medications  . midodrine (PROAMATINE) 5 MG tablet    Sig: Take 1 tablet (5 mg total) by mouth 2 (two) times daily with a meal.    Dispense:  60 tablet    Refill:  0  . esomeprazole (NEXIUM) 40 MG capsule    Sig: Take 1 capsule (40 mg total) by mouth daily at 12 noon.    Dispense:  30 capsule    Refill:  5    Partially dictated using Editor, commissioning. Any errors are unintentional.  Halina Maidens, MD [facility]  03/16/2017

## 2017-03-17 ENCOUNTER — Other Ambulatory Visit: Payer: Self-pay

## 2017-03-17 ENCOUNTER — Telehealth: Payer: Self-pay

## 2017-03-17 DIAGNOSIS — R1312 Dysphagia, oropharyngeal phase: Secondary | ICD-10-CM | POA: Diagnosis not present

## 2017-03-17 DIAGNOSIS — Z9181 History of falling: Secondary | ICD-10-CM | POA: Diagnosis not present

## 2017-03-17 DIAGNOSIS — K219 Gastro-esophageal reflux disease without esophagitis: Secondary | ICD-10-CM | POA: Diagnosis not present

## 2017-03-17 DIAGNOSIS — I951 Orthostatic hypotension: Secondary | ICD-10-CM | POA: Diagnosis not present

## 2017-03-17 DIAGNOSIS — M10072 Idiopathic gout, left ankle and foot: Secondary | ICD-10-CM | POA: Diagnosis not present

## 2017-03-17 DIAGNOSIS — K8020 Calculus of gallbladder without cholecystitis without obstruction: Secondary | ICD-10-CM | POA: Diagnosis not present

## 2017-03-17 DIAGNOSIS — R1310 Dysphagia, unspecified: Secondary | ICD-10-CM

## 2017-03-17 DIAGNOSIS — M6281 Muscle weakness (generalized): Secondary | ICD-10-CM | POA: Diagnosis not present

## 2017-03-17 DIAGNOSIS — K5730 Diverticulosis of large intestine without perforation or abscess without bleeding: Secondary | ICD-10-CM | POA: Diagnosis not present

## 2017-03-17 DIAGNOSIS — G4733 Obstructive sleep apnea (adult) (pediatric): Secondary | ICD-10-CM | POA: Diagnosis not present

## 2017-03-17 DIAGNOSIS — K314 Gastric diverticulum: Secondary | ICD-10-CM | POA: Diagnosis not present

## 2017-03-17 NOTE — Telephone Encounter (Signed)
Patients sister [name] has been informed of appt for barrium swallow at [facility] September 10th at [facility].  Instructed [name] to make sure her sister has nothing to eat or drink after midnight. Arrival time 9am.

## 2017-03-17 NOTE — Telephone Encounter (Signed)
Pts scheduled Modified Barium Swallow appt canceled received call back from scheduling that it needs to go through the pathology dept.  Left message with them to contact me to schedule.  Notified patients sister Taylor Johnson that original appt date is canceled and we will await pathology to contact us to schedule.  Thanks [employer]

## 2017-03-20 DIAGNOSIS — K5730 Diverticulosis of large intestine without perforation or abscess without bleeding: Secondary | ICD-10-CM | POA: Diagnosis not present

## 2017-03-20 DIAGNOSIS — K219 Gastro-esophageal reflux disease without esophagitis: Secondary | ICD-10-CM | POA: Diagnosis not present

## 2017-03-20 DIAGNOSIS — M10072 Idiopathic gout, left ankle and foot: Secondary | ICD-10-CM | POA: Diagnosis not present

## 2017-03-20 DIAGNOSIS — G4733 Obstructive sleep apnea (adult) (pediatric): Secondary | ICD-10-CM | POA: Diagnosis not present

## 2017-03-20 DIAGNOSIS — K8020 Calculus of gallbladder without cholecystitis without obstruction: Secondary | ICD-10-CM | POA: Diagnosis not present

## 2017-03-20 DIAGNOSIS — M6281 Muscle weakness (generalized): Secondary | ICD-10-CM | POA: Diagnosis not present

## 2017-03-20 DIAGNOSIS — Z9181 History of falling: Secondary | ICD-10-CM | POA: Diagnosis not present

## 2017-03-20 DIAGNOSIS — I951 Orthostatic hypotension: Secondary | ICD-10-CM | POA: Diagnosis not present

## 2017-03-20 DIAGNOSIS — K314 Gastric diverticulum: Secondary | ICD-10-CM | POA: Diagnosis not present

## 2017-03-20 DIAGNOSIS — R1312 Dysphagia, oropharyngeal phase: Secondary | ICD-10-CM | POA: Diagnosis not present

## 2017-03-21 ENCOUNTER — Encounter: Payer: Self-pay | Admitting: Internal Medicine

## 2017-03-21 ENCOUNTER — Other Ambulatory Visit: Payer: Self-pay | Admitting: Internal Medicine

## 2017-03-21 DIAGNOSIS — I471 Supraventricular tachycardia: Secondary | ICD-10-CM

## 2017-03-22 ENCOUNTER — Encounter: Payer: Self-pay | Admitting: Gastroenterology

## 2017-03-22 ENCOUNTER — Other Ambulatory Visit: Payer: Self-pay

## 2017-03-22 DIAGNOSIS — Z9181 History of falling: Secondary | ICD-10-CM | POA: Diagnosis not present

## 2017-03-22 DIAGNOSIS — K314 Gastric diverticulum: Secondary | ICD-10-CM | POA: Diagnosis not present

## 2017-03-22 DIAGNOSIS — G4733 Obstructive sleep apnea (adult) (pediatric): Secondary | ICD-10-CM | POA: Diagnosis not present

## 2017-03-22 DIAGNOSIS — K8020 Calculus of gallbladder without cholecystitis without obstruction: Secondary | ICD-10-CM | POA: Diagnosis not present

## 2017-03-22 DIAGNOSIS — M10072 Idiopathic gout, left ankle and foot: Secondary | ICD-10-CM | POA: Diagnosis not present

## 2017-03-22 DIAGNOSIS — R1312 Dysphagia, oropharyngeal phase: Secondary | ICD-10-CM | POA: Diagnosis not present

## 2017-03-22 DIAGNOSIS — M6281 Muscle weakness (generalized): Secondary | ICD-10-CM | POA: Diagnosis not present

## 2017-03-22 DIAGNOSIS — I951 Orthostatic hypotension: Secondary | ICD-10-CM | POA: Diagnosis not present

## 2017-03-22 DIAGNOSIS — K5730 Diverticulosis of large intestine without perforation or abscess without bleeding: Secondary | ICD-10-CM | POA: Diagnosis not present

## 2017-03-22 DIAGNOSIS — K219 Gastro-esophageal reflux disease without esophagitis: Secondary | ICD-10-CM | POA: Diagnosis not present

## 2017-03-23 ENCOUNTER — Ambulatory Visit: Payer: Medicare Other | Admitting: Gastroenterology

## 2017-03-24 DIAGNOSIS — M6281 Muscle weakness (generalized): Secondary | ICD-10-CM | POA: Diagnosis not present

## 2017-03-24 DIAGNOSIS — I951 Orthostatic hypotension: Secondary | ICD-10-CM | POA: Diagnosis not present

## 2017-03-24 DIAGNOSIS — G4733 Obstructive sleep apnea (adult) (pediatric): Secondary | ICD-10-CM | POA: Diagnosis not present

## 2017-03-24 DIAGNOSIS — M10072 Idiopathic gout, left ankle and foot: Secondary | ICD-10-CM | POA: Diagnosis not present

## 2017-03-24 DIAGNOSIS — K8020 Calculus of gallbladder without cholecystitis without obstruction: Secondary | ICD-10-CM | POA: Diagnosis not present

## 2017-03-24 DIAGNOSIS — K5730 Diverticulosis of large intestine without perforation or abscess without bleeding: Secondary | ICD-10-CM | POA: Diagnosis not present

## 2017-03-24 DIAGNOSIS — R1312 Dysphagia, oropharyngeal phase: Secondary | ICD-10-CM | POA: Diagnosis not present

## 2017-03-24 DIAGNOSIS — K314 Gastric diverticulum: Secondary | ICD-10-CM | POA: Diagnosis not present

## 2017-03-24 DIAGNOSIS — Z9181 History of falling: Secondary | ICD-10-CM | POA: Diagnosis not present

## 2017-03-24 DIAGNOSIS — K219 Gastro-esophageal reflux disease without esophagitis: Secondary | ICD-10-CM | POA: Diagnosis not present

## 2017-03-27 ENCOUNTER — Ambulatory Visit: Payer: Medicare Other

## 2017-03-27 DIAGNOSIS — R1312 Dysphagia, oropharyngeal phase: Secondary | ICD-10-CM | POA: Diagnosis not present

## 2017-03-27 DIAGNOSIS — K5730 Diverticulosis of large intestine without perforation or abscess without bleeding: Secondary | ICD-10-CM | POA: Diagnosis not present

## 2017-03-27 DIAGNOSIS — M10072 Idiopathic gout, left ankle and foot: Secondary | ICD-10-CM | POA: Diagnosis not present

## 2017-03-27 DIAGNOSIS — G4733 Obstructive sleep apnea (adult) (pediatric): Secondary | ICD-10-CM | POA: Diagnosis not present

## 2017-03-27 DIAGNOSIS — M6281 Muscle weakness (generalized): Secondary | ICD-10-CM | POA: Diagnosis not present

## 2017-03-27 DIAGNOSIS — Z9181 History of falling: Secondary | ICD-10-CM | POA: Diagnosis not present

## 2017-03-27 DIAGNOSIS — K314 Gastric diverticulum: Secondary | ICD-10-CM | POA: Diagnosis not present

## 2017-03-27 DIAGNOSIS — K8020 Calculus of gallbladder without cholecystitis without obstruction: Secondary | ICD-10-CM | POA: Diagnosis not present

## 2017-03-27 DIAGNOSIS — K219 Gastro-esophageal reflux disease without esophagitis: Secondary | ICD-10-CM | POA: Diagnosis not present

## 2017-03-27 DIAGNOSIS — I951 Orthostatic hypotension: Secondary | ICD-10-CM | POA: Diagnosis not present

## 2017-03-28 DIAGNOSIS — K219 Gastro-esophageal reflux disease without esophagitis: Secondary | ICD-10-CM | POA: Diagnosis not present

## 2017-03-28 DIAGNOSIS — G4733 Obstructive sleep apnea (adult) (pediatric): Secondary | ICD-10-CM | POA: Diagnosis not present

## 2017-03-28 DIAGNOSIS — K8020 Calculus of gallbladder without cholecystitis without obstruction: Secondary | ICD-10-CM | POA: Diagnosis not present

## 2017-03-28 DIAGNOSIS — I951 Orthostatic hypotension: Secondary | ICD-10-CM | POA: Diagnosis not present

## 2017-03-28 DIAGNOSIS — M10072 Idiopathic gout, left ankle and foot: Secondary | ICD-10-CM | POA: Diagnosis not present

## 2017-03-28 DIAGNOSIS — K5730 Diverticulosis of large intestine without perforation or abscess without bleeding: Secondary | ICD-10-CM | POA: Diagnosis not present

## 2017-03-28 DIAGNOSIS — R1312 Dysphagia, oropharyngeal phase: Secondary | ICD-10-CM | POA: Diagnosis not present

## 2017-03-28 DIAGNOSIS — Z9181 History of falling: Secondary | ICD-10-CM | POA: Diagnosis not present

## 2017-03-28 DIAGNOSIS — K314 Gastric diverticulum: Secondary | ICD-10-CM | POA: Diagnosis not present

## 2017-03-28 DIAGNOSIS — M6281 Muscle weakness (generalized): Secondary | ICD-10-CM | POA: Diagnosis not present

## 2017-03-29 DIAGNOSIS — M6281 Muscle weakness (generalized): Secondary | ICD-10-CM | POA: Diagnosis not present

## 2017-03-29 DIAGNOSIS — K8020 Calculus of gallbladder without cholecystitis without obstruction: Secondary | ICD-10-CM | POA: Diagnosis not present

## 2017-03-29 DIAGNOSIS — K314 Gastric diverticulum: Secondary | ICD-10-CM | POA: Diagnosis not present

## 2017-03-29 DIAGNOSIS — K5730 Diverticulosis of large intestine without perforation or abscess without bleeding: Secondary | ICD-10-CM | POA: Diagnosis not present

## 2017-03-29 DIAGNOSIS — Z9181 History of falling: Secondary | ICD-10-CM | POA: Diagnosis not present

## 2017-03-29 DIAGNOSIS — M10072 Idiopathic gout, left ankle and foot: Secondary | ICD-10-CM | POA: Diagnosis not present

## 2017-03-29 DIAGNOSIS — K219 Gastro-esophageal reflux disease without esophagitis: Secondary | ICD-10-CM | POA: Diagnosis not present

## 2017-03-29 DIAGNOSIS — G4733 Obstructive sleep apnea (adult) (pediatric): Secondary | ICD-10-CM | POA: Diagnosis not present

## 2017-03-29 DIAGNOSIS — I951 Orthostatic hypotension: Secondary | ICD-10-CM | POA: Diagnosis not present

## 2017-03-29 DIAGNOSIS — R1312 Dysphagia, oropharyngeal phase: Secondary | ICD-10-CM | POA: Diagnosis not present

## 2017-03-30 DIAGNOSIS — G4733 Obstructive sleep apnea (adult) (pediatric): Secondary | ICD-10-CM | POA: Diagnosis not present

## 2017-04-03 ENCOUNTER — Telehealth: Payer: Self-pay

## 2017-04-03 NOTE — Telephone Encounter (Signed)
Patient's sister [name] called letting us know patient has finally received CPAP machine. Wanted to call and thank [name] for all that we have done.

## 2017-04-04 ENCOUNTER — Telehealth: Payer: Self-pay

## 2017-04-04 ENCOUNTER — Encounter: Payer: Self-pay | Admitting: Cardiovascular Disease

## 2017-04-04 ENCOUNTER — Ambulatory Visit (INDEPENDENT_AMBULATORY_CARE_PROVIDER_SITE_OTHER): Payer: Medicare Other | Admitting: Cardiovascular Disease

## 2017-04-04 VITALS — BP 131/74 | HR 95 | Ht 64.0 in | Wt 235.0 lb

## 2017-04-04 DIAGNOSIS — G4733 Obstructive sleep apnea (adult) (pediatric): Secondary | ICD-10-CM | POA: Diagnosis not present

## 2017-04-04 DIAGNOSIS — K8020 Calculus of gallbladder without cholecystitis without obstruction: Secondary | ICD-10-CM | POA: Diagnosis not present

## 2017-04-04 DIAGNOSIS — K219 Gastro-esophageal reflux disease without esophagitis: Secondary | ICD-10-CM | POA: Diagnosis not present

## 2017-04-04 DIAGNOSIS — Z9181 History of falling: Secondary | ICD-10-CM | POA: Diagnosis not present

## 2017-04-04 DIAGNOSIS — R1312 Dysphagia, oropharyngeal phase: Secondary | ICD-10-CM | POA: Diagnosis not present

## 2017-04-04 DIAGNOSIS — R011 Cardiac murmur, unspecified: Secondary | ICD-10-CM

## 2017-04-04 DIAGNOSIS — M10072 Idiopathic gout, left ankle and foot: Secondary | ICD-10-CM | POA: Diagnosis not present

## 2017-04-04 DIAGNOSIS — I951 Orthostatic hypotension: Secondary | ICD-10-CM | POA: Diagnosis not present

## 2017-04-04 DIAGNOSIS — R0602 Shortness of breath: Secondary | ICD-10-CM | POA: Diagnosis not present

## 2017-04-04 DIAGNOSIS — K5730 Diverticulosis of large intestine without perforation or abscess without bleeding: Secondary | ICD-10-CM | POA: Diagnosis not present

## 2017-04-04 DIAGNOSIS — K314 Gastric diverticulum: Secondary | ICD-10-CM | POA: Diagnosis not present

## 2017-04-04 DIAGNOSIS — M6281 Muscle weakness (generalized): Secondary | ICD-10-CM | POA: Diagnosis not present

## 2017-04-04 NOTE — Telephone Encounter (Signed)
Fredric Mare from [facility] is calling about patient. Requesting verbal to continue OT with Camrin Pinnis 2 X weekly for 4 weeks.  Verbal was given over the telephone.

## 2017-04-04 NOTE — Progress Notes (Signed)
Cardiology Office Note   Date:  04/04/2017   ID:  Taylor Johnson, DOB Oct 21, 1945, MRN [id number]  PCP:  [name], MD  Cardiologist:   Kathlyn Sacramento, MD   Chief Complaint  Patient presents with  . other    New patient. Patient c/o SOB at times. Meds reviewed verbally with patient.       History of Present Illness: Taylor Johnson is a 71 y.o. female who Was referred by Dr. Army Melia for evaluation of shortness of breath.she has no prior cardiac history. She has chronic medical conditions that include asthma, sleep apnea on CPAP and essential hypertension. She was hospitalized in July at [facility] with abdominal pain. CT scan showed severe diverticulosis without diverticulitis. The patient had significant dysphagia and was seen by gastroenterology. EGD was unremarkable. She was discharged to rehabilitation due to significant deconditioning. The patient had orthostatic hypotension before hospital discharge and was sent home on Midodrin.  Post discharge, she complained of worsening dyspnea and leg edema which gradually improved. She denies any chest pain. She is not a smoker and has no family history of coronary artery disease. She currently walks with a walker.   Past Medical History:  Diagnosis Date  . Anxiety   . Anxiety, generalized 05/11/2015  . Asthma   . Cough 06/02/2016   Chronic - followed by Pulmonary  . Depression   . Developmental delay   . Diverticulitis   . Diverticulosis of colon 05/11/2015  . Dysphagia 10/15/2015   Routine Ba Swallow normal; rec modified barium swallow   . Hypoxia 09/12/2015  . Leg swelling   . Localized edema 04/15/2016  . Microscopic hematuria 04/15/2016  . OSA on CPAP 11/18/2015   CPAP @ 18 cm H2O started 02/2016  . Urge incontinence of urine 05/12/2015    Past Surgical History:  Procedure Laterality Date  . ABDOMINAL HYSTERECTOMY    . COLON SURGERY    . ESOPHAGOGASTRODUODENOSCOPY (EGD) WITH PROPOFOL N/A 01/29/2017   Procedure:  ESOPHAGOGASTRODUODENOSCOPY (EGD) WITH PROPOFOL;  Surgeon: Wilford Corner, MD;  Location: [facility] ENDOSCOPY;  Service: Endoscopy;  Laterality: N/A;     Current Outpatient Prescriptions  Medication Sig Dispense Refill  . chlorthalidone (HYGROTON) 25 MG tablet Take 25 mg by mouth daily.    . colchicine 0.6 MG tablet Take 0.6 mg by mouth daily.    [name] docusate sodium (COLACE) 100 MG capsule Take 100 mg by mouth 2 (two) times daily.    [name] esomeprazole (NEXIUM) 40 MG capsule Take 1 capsule (40 mg total) by mouth daily at 12 noon. 30 capsule 5  . HYDROcodone-acetaminophen (NORCO) 5-325 MG tablet Take 1 tablet by mouth every 6 (six) hours as needed for moderate pain. 20 tablet 0  . hyoscyamine (LEVSIN) 0.125 MG/ML solution Take 0.125 mg by mouth every 4 (four) hours as needed.    . metroNIDAZOLE (FLAGYL) 500 MG tablet Take 1 tablet (500 mg total) by mouth 2 (two) times daily. 20 tablet 0  . midodrine (PROAMATINE) 5 MG tablet Take 1 tablet (5 mg total) by mouth 2 (two) times daily with a meal. 60 tablet 0  . ondansetron (ZOFRAN) 4 MG tablet Take 1 tablet (4 mg total) by mouth every 6 (six) hours as needed for nausea. 20 tablet 0  . potassium chloride SA (K-DUR,KLOR-CON) 20 MEQ tablet Take 20 mEq by mouth 2 (two) times daily.     No current facility-administered medications for this visit.     Allergies:   Patient has  no known allergies.    Social History:  The patient  reports that she has never smoked. She has never used smokeless tobacco. She reports that she does not drink alcohol or use drugs.   Family History:  The patient's family history includes Hypertension in her mother; Kidney disease in her mother.    ROS:  Please see the history of present illness.   Otherwise, review of systems are positive for none.   All other systems are reviewed and negative.    PHYSICAL EXAM: VS:  BP 131/74 (BP Location: Right Arm, Patient Position: Sitting, Cuff Size: Normal)   Pulse 95   Ht 5\' 4"  (1.626 m)    Wt 235 lb (106.6 kg)   BMI 40.34 kg/m  , BMI Body mass index is 40.34 kg/m. GEN: Well nourished, well developed, in no acute distress  HEENT: normal  Neck: no JVD, carotid bruits, or masses Cardiac: RRR; no rubs, or gallops,no edema . 2/6 systolic ejection murmur in the aortic area. Respiratory:  clear to auscultation bilaterally, normal work of breathing GI: soft, nontender, nondistended, + BS MS: no deformity or atrophy  Skin: warm and dry, no rash Neuro:  Strength and sensation are intact Psych: euthymic mood, full affect   EKG:  EKG is ordered today. The ekg ordered today demonstrates normal sinus rhythm with possible old inferior infarct. Lateral T wave changes suggestive of ischemia.   Recent Labs: 06/02/2016: TSH 0.787 10/07/2016: Magnesium 1.9 01/28/2017: ALT 62 01/31/2017: Hemoglobin 12.2; Platelets 238 02/01/2017: BUN 9; Creatinine, Ser 0.81; Potassium 3.3; Sodium 137    Lipid Panel    Component Value Date/Time   CHOL 157 06/02/2016 1034   TRIG 79 06/02/2016 1034   HDL 61 06/02/2016 1034   CHOLHDL 2.6 06/02/2016 1034   LDLCALC 80 06/02/2016 1034      Wt Readings from Last 3 Encounters:  04/04/17 235 lb (106.6 kg)  03/16/17 235 lb 9.6 oz (106.9 kg)  03/15/17 238 lb 9.6 oz (108.2 kg)       No flowsheet data found.    ASSESSMENT AND PLAN:  1.  Significant exertional dyspnea with no signs of heart failure. The patient has risk factors for coronary artery disease and we have to exclude angina equivalent. Her EKG is abnormal with possible old inferior infarct and lateral T wave changes suggestive of ischemia. Thus, I requested a pharmacologic nuclear stress test for evaluation. She is not able to exercise on a treadmill.  2. Cardiac murmur: The patient has a heart murmur in the aortic area with no prior evaluation. Given her shortness of breath, I requested an echocardiogram.    Disposition:   FU with me as needed based on above results.    Signed,  Kathlyn Sacramento, MD  04/04/2017 3:31 PM    [place]

## 2017-04-04 NOTE — Patient Instructions (Addendum)
Medication Instructions:  Your physician recommends that you continue on your current medications as directed. Please refer to the Current Medication list given to you today.   Labwork: none  Testing/Procedures: Your physician has requested that you have an echocardiogram. Echocardiography is a painless test that uses sound waves to create images of your heart. It provides your doctor with information about the size and shape of your heart and how well your heart's chambers and valves are working. This procedure takes approximately one hour. There are no restrictions for this procedure.  Your physician has requested that you have a lexiscan myoview. For further information please visit [email or web address]. Please follow instruction sheet, as given.  [place]  Your caregiver has ordered a Stress Test with nuclear imaging. The purpose of this test is to evaluate the blood supply to your heart muscle. This procedure is referred to as a "Non-Invasive Stress Test." This is because other than having an IV started in your vein, nothing is inserted or "invades" your body. Cardiac stress tests are done to find areas of poor blood flow to the heart by determining the extent of coronary artery disease (CAD). Some patients exercise on a treadmill, which naturally increases the blood flow to your heart, while others who are  unable to walk on a treadmill due to physical limitations have a pharmacologic/chemical stress agent called Lexiscan . This medicine will mimic walking on a treadmill by temporarily increasing your coronary blood flow.   Please note: these test may take anywhere between 2-4 hours to complete  PLEASE REPORT TO [facility] AT THE FIRST DESK WILL DIRECT YOU WHERE TO GO  Date of Procedure:_Thursday, Sept 27___  Arrival Time for Procedure: 7:45am  Instructions regarding medication:   You may take your morning medications with a sip of water.   PLEASE  NOTIFY THE OFFICE AT LEAST 59 HOURS IN ADVANCE IF YOU ARE UNABLE TO KEEP YOUR APPOINTMENT.  [phone] AND  PLEASE NOTIFY NUCLEAR MEDICINE AT [facility] AT LEAST 24 HOURS IN ADVANCE IF YOU ARE UNABLE TO KEEP YOUR APPOINTMENT. [phone]  How to prepare for your Myoview test:  1. Do not eat or drink after midnight 2. No caffeine for 24 hours prior to test 3. No smoking 24 hours prior to test. 4. Your medication may be taken with water.  If your doctor stopped a medication because of this test, do not take that medication. 5. Ladies, please do not wear dresses.  Skirts or pants are appropriate. Please wear a short sleeve shirt. 6. No perfume, cologne or lotion. 7. Wear comfortable walking shoes. No heels!            Follow-Up: Your physician recommends that you schedule a follow-up appointment as needed.    Any Other Special Instructions Will Be Listed Below (If Applicable).     If you need a refill on your cardiac medications before your next appointment, please call your pharmacy.  Echocardiogram An echocardiogram, or echocardiography, uses sound waves (ultrasound) to produce an image of your heart. The echocardiogram is simple, painless, obtained within a short period of time, and offers valuable information to your health care provider. The images from an echocardiogram can provide information such as:  Evidence of coronary artery disease (CAD).  Heart size.  Heart muscle function.  Heart valve function.  Aneurysm detection.  Evidence of a past heart attack.  Fluid buildup around the heart.  Heart muscle thickening.  Assess heart valve function.  Tell a health care provider about:  Any allergies you have.  All medicines you are taking, including vitamins, herbs, eye drops, creams, and over-the-counter medicines.  Any problems you or family members have had with anesthetic medicines.  Any blood disorders you have.  Any surgeries you have had.  Any  medical conditions you have.  Whether you are pregnant or may be pregnant. What happens before the procedure? No special preparation is needed. Eat and drink normally. What happens during the procedure?  In order to produce an image of your heart, gel will be applied to your chest and a wand-like tool (transducer) will be moved over your chest. The gel will help transmit the sound waves from the transducer. The sound waves will harmlessly bounce off your heart to allow the heart images to be captured in real-time motion. These images will then be recorded.  You may need an IV to receive a medicine that improves the quality of the pictures. What happens after the procedure? You may return to your normal schedule including diet, activities, and medicines, unless your health care provider tells you otherwise. This information is not intended to replace advice given to you by your health care provider. Make sure you discuss any questions you have with your health care provider. Document Released: 07/01/2000 Document Revised: 02/20/2016 Document Reviewed: 03/11/2013 Elsevier Interactive Patient Education  2017 [employer]. Cardiac Nuclear Scan A cardiac nuclear scan is a test that measures blood flow to the heart when a person is resting and when he or she is exercising. The test looks for problems such as:  Not enough blood reaching a portion of the heart.  The heart muscle not working normally.  You may need this test if:  You have heart disease.  You have had abnormal lab results.  You have had heart surgery or angioplasty.  You have chest pain.  You have shortness of breath.  In this test, a radioactive dye (tracer) is injected into your bloodstream. After the tracer has traveled to your heart, an imaging device is used to measure how much of the tracer is absorbed by or distributed to various areas of your heart. This procedure is usually done at a hospital and takes 2-4  hours. Tell a health care provider about:  Any allergies you have.  All medicines you are taking, including vitamins, herbs, eye drops, creams, and over-the-counter medicines.  Any problems you or family members have had with the use of anesthetic medicines.  Any blood disorders you have.  Any surgeries you have had.  Any medical conditions you have.  Whether you are pregnant or may be pregnant. What are the risks? Generally, this is a safe procedure. However, problems may occur, including:  Serious chest pain and heart attack. This is only a risk if the stress portion of the test is done.  Rapid heartbeat.  Sensation of warmth in your chest. This usually passes quickly.  What happens before the procedure?  Ask your health care provider about changing or stopping your regular medicines. This is especially important if you are taking diabetes medicines or blood thinners.  Remove your jewelry on the day of the procedure. What happens during the procedure?  An IV tube will be inserted into one of your veins.  Your health care provider will inject a small amount of radioactive tracer through the tube.  You will wait for 20-40 minutes while the tracer travels through your bloodstream.  Your heart activity will be monitored  with an electrocardiogram (ECG).  You will lie down on an exam table.  Images of your heart will be taken for about 15-20 minutes.  You may be asked to exercise on a treadmill or stationary bike. While you exercise, your heart's activity will be monitored with an ECG, and your blood pressure will be checked. If you are unable to exercise, you may be given a medicine to increase blood flow to parts of your heart.  When blood flow to your heart has peaked, a tracer will again be injected through the IV tube.  After 20-40 minutes, you will get back on the exam table and have more images taken of your heart.  When the procedure is over, your IV tube will be  removed. The procedure may vary among health care providers and hospitals. Depending on the type of tracer used, scans may need to be repeated 3-4 hours later. What happens after the procedure?  Unless your health care provider tells you otherwise, you may return to your normal schedule, including diet, activities, and medicines.  Unless your health care provider tells you otherwise, you may increase your fluid intake. This will help flush the contrast dye from your body. Drink enough fluid to keep your urine clear or pale yellow.  It is up to you to get your test results. Ask your health care provider, or the department that is doing the test, when your results will be ready. Summary  A cardiac nuclear scan measures the blood flow to the heart when a person is resting and when he or she is exercising.  You may need this test if you are at risk for heart disease.  Tell your health care provider if you are pregnant.  Unless your health care provider tells you otherwise, increase your fluid intake. This will help flush the contrast dye from your body. Drink enough fluid to keep your urine clear or pale yellow. This information is not intended to replace advice given to you by your health care provider. Make sure you discuss any questions you have with your health care provider. Document Released: 07/29/2004 Document Revised: 07/06/2016 Document Reviewed: 06/12/2013 Elsevier Interactive Patient Education  2017 [employer].

## 2017-04-05 ENCOUNTER — Telehealth: Payer: Self-pay

## 2017-04-05 ENCOUNTER — Ambulatory Visit
Admission: RE | Admit: 2017-04-05 | Discharge: 2017-04-05 | Disposition: A | Discharge status: Home / Self Care | Payer: Medicare Other | Source: Ambulatory Visit | Attending: Gastroenterology | Admitting: Gastroenterology

## 2017-04-05 ENCOUNTER — Other Ambulatory Visit: Payer: Self-pay | Admitting: Gastroenterology

## 2017-04-05 DIAGNOSIS — R1319 Other dysphagia: Secondary | ICD-10-CM | POA: Insufficient documentation

## 2017-04-05 DIAGNOSIS — T17300A Unspecified foreign body in larynx causing asphyxiation, initial encounter: Secondary | ICD-10-CM | POA: Diagnosis not present

## 2017-04-05 DIAGNOSIS — R1310 Dysphagia, unspecified: Secondary | ICD-10-CM | POA: Diagnosis not present

## 2017-04-05 NOTE — Therapy (Signed)
[place], [place], [address] Phone: [phone]   Fax:     Modified Barium Swallow  Patient Details  Name: Taylor Johnson MRN: [id number] Date of Birth: 10-18-1945 No Data Recorded  Encounter Date: 04/05/2017      End of Session - 04/05/17 1337    Visit Number 1   Number of Visits 1   Date for SLP Re-Evaluation 04/05/17   SLP Start Time 52   SLP Stop Time  1337   SLP Time Calculation (min) 37 min   Activity Tolerance Patient tolerated treatment well      Past Medical History:  Diagnosis Date  . Anxiety   . Anxiety, generalized 05/11/2015  . Asthma   . Cough 06/02/2016   Chronic - followed by Pulmonary  . Depression   . Developmental delay   . Diverticulitis   . Diverticulosis of colon 05/11/2015  . Dysphagia 10/15/2015   Routine Ba Swallow normal; rec modified barium swallow   . Hypoxia 09/12/2015  . Leg swelling   . Localized edema 04/15/2016  . Microscopic hematuria 04/15/2016  . OSA on CPAP 11/18/2015   CPAP @ 18 cm H2O started 02/2016  . Urge incontinence of urine 05/12/2015    Past Surgical History:  Procedure Laterality Date  . ABDOMINAL HYSTERECTOMY    . COLON SURGERY    . ESOPHAGOGASTRODUODENOSCOPY (EGD) WITH PROPOFOL N/A 01/29/2017   Procedure: ESOPHAGOGASTRODUODENOSCOPY (EGD) WITH PROPOFOL;  Surgeon: Wilford Corner, MD;  Location: [facility] END[facility]COPY;  Service: Endoscopy;  Laterality: N/A;    There were no vitals filed for this visit.   Subjective: Patient behavior: (alertness, ability to follow instructions, etc.): the patient is able to follow directions  Chief complaint: difficulty swallowing ("it gets stuck in my throat")   Objective:  Radiological Procedure: A videoflouroscopic evaluation of oral-preparatory, reflex initiation, and pharyngeal phases of the swallow was performed; as well as a screening of the upper esophageal phase.  I. POSTURE: Upright in MBS  chair  II. VIEW: Lateral  III. COMPENSATORY STRATEGIES: N/A  IV. BOLUSES ADMINISTERED:   Thin Liquid: 3 rapid consecutive   Nectar-thick Liquid: 2 cup rim sips   Honey-thick Liquid: DNT   Puree: 1/4 teaspoon (patient self-fed), 3/4 teaspoon (patient encouraged to take bigger bite)   Mechanical Soft:  graham cracker in applesauce  V. RESULTS OF EVALUATION: A. ORAL PREPARATORY PHASE: (The lips, tongue, and velum are observed for strength and coordination)       **Overall Severity Rating: Within normal limits  B. SWALLOW INITIATION/REFLEX: (The reflex is normal if "triggered" by the time the bolus reached the base of the tongue)  **Overall Severity Rating: Within normal limits  C. PHARYNGEAL PHASE: (Pharyngeal function is normal if the bolus shows rapid, smooth, and continuous transit through the pharynx and there is no pharyngeal residue after the swallow)  **Overall Severity Rating: Within normal limits  D. LARYNGEAL PENETRATION: (Material entering into the laryngeal inlet/vestibule but not aspirated) None  E. ASPIRATION: None  F. ESOPHAGEAL PHASE: (Screening of the upper esophagus) No abnormality within the viewable cervical esophagus  ASSESSMENT: This 71 year old woman; with difficulty swallowing ("it gets stuck in my throat"); is presenting with normal oropharyngeal swallowing.  Oral control of the bolus including oral hold, rotary mastication, and anterior to posterior transfer are within normal limits. The patient self-fed small amount, but increased bite size on request. She was able to adequately masticate  graham cracker in applesauce.  Timing of the pharyngeal swallow is within normal limits.  Aspects of the pharyngeal stage of swallowing including tongue base retraction, hyolaryngeal excursion, epiglottic inversion, and duration/amplitude of UES opening are within normal limits.  There is no observed pharyngeal residue, laryngeal penetration, or tracheal aspiration.  The  patient's experience with difficulty swallowing does not appear to be due to oropharyngeal swallow function.  She may be more comfortable with soft, easy to chew foods.  PLAN/RECOMMENDATIONS:   A. Diet: Regular (She may be more comfortable with soft, easy to chew foods.)   B. Swallowing Precautions: No special precautions from this study   C. Recommended consultation to: follow up with MDs as recommended   D. Therapy recommendations: Not indicated   E. Results and recommendations were discussed with the patient immediately following the study (doubtful she understood) and the final report routed to the referring MD.     Other dysphagia - Plan: DG OP Swallowing Func-Medicare/Speech Path, DG OP Swallowing Func-Medicare/Speech Path, CANCELED: DG Swallowing Func-Speech Pathology, CANCELED: DG Swallowing Func-Speech Pathology      G-Codes - 04-21-17 1337    Functional Assessment Tool Used MBSS, clinical judgment   Functional Limitations Swallowing   Swallow Current Status (P5945) 0 percent impaired, limited or restricted   Swallow Goal Status (O5929) 0 percent impaired, limited or restricted   Swallow Discharge Status (W4462) 0 percent impaired, limited or restricted          Problem List Patient Active Problem List   Diagnosis Date Noted  . PAT (paroxysmal atrial tachycardia) ([place]) 03/21/2017  . Orthostatic hypotension 03/16/2017  . Developmental delay disorder 02/06/2017  . Nausea and vomiting 01/29/2017  . Abdominal pain 01/27/2017  . Calculus of gallbladder without cholecystitis without obstruction 01/20/2017  . Cough 06/02/2016  . Localized edema 04/15/2016  . Microscopic hematuria 04/15/2016  . OSA on CPAP 11/18/2015  . Dysphagia 10/15/2015  . Hypoxia 09/12/2015  . Urge incontinence of urine 05/12/2015  . Diverticulosis of colon 05/11/2015  . Anxiety, generalized 05/11/2015   Leroy Sea, MS/CCC- SLP  Lou Miner 04-21-2017, 1:38 PM  [facility] [place], [place], [address] Phone: [phone]   Fax:     Name: Taylor Johnson MRN: [id number] Date of Birth: [birth year]

## 2017-04-05 NOTE — Telephone Encounter (Signed)
Aldrin from [facility] called requesting verbal approval for PT 2 times weekly for 4 more weeks.   Called and gave approval for this order verbally over the phone.

## 2017-04-06 DIAGNOSIS — I951 Orthostatic hypotension: Secondary | ICD-10-CM | POA: Diagnosis not present

## 2017-04-06 DIAGNOSIS — K314 Gastric diverticulum: Secondary | ICD-10-CM | POA: Diagnosis not present

## 2017-04-06 DIAGNOSIS — R1312 Dysphagia, oropharyngeal phase: Secondary | ICD-10-CM | POA: Diagnosis not present

## 2017-04-06 DIAGNOSIS — K5730 Diverticulosis of large intestine without perforation or abscess without bleeding: Secondary | ICD-10-CM | POA: Diagnosis not present

## 2017-04-06 DIAGNOSIS — K8020 Calculus of gallbladder without cholecystitis without obstruction: Secondary | ICD-10-CM | POA: Diagnosis not present

## 2017-04-06 DIAGNOSIS — M10072 Idiopathic gout, left ankle and foot: Secondary | ICD-10-CM | POA: Diagnosis not present

## 2017-04-06 DIAGNOSIS — K219 Gastro-esophageal reflux disease without esophagitis: Secondary | ICD-10-CM | POA: Diagnosis not present

## 2017-04-06 DIAGNOSIS — M6281 Muscle weakness (generalized): Secondary | ICD-10-CM | POA: Diagnosis not present

## 2017-04-06 DIAGNOSIS — G4733 Obstructive sleep apnea (adult) (pediatric): Secondary | ICD-10-CM | POA: Diagnosis not present

## 2017-04-06 DIAGNOSIS — Z9181 History of falling: Secondary | ICD-10-CM | POA: Diagnosis not present

## 2017-04-10 DIAGNOSIS — K314 Gastric diverticulum: Secondary | ICD-10-CM | POA: Diagnosis not present

## 2017-04-10 DIAGNOSIS — I951 Orthostatic hypotension: Secondary | ICD-10-CM | POA: Diagnosis not present

## 2017-04-10 DIAGNOSIS — Z9181 History of falling: Secondary | ICD-10-CM | POA: Diagnosis not present

## 2017-04-10 DIAGNOSIS — M6281 Muscle weakness (generalized): Secondary | ICD-10-CM | POA: Diagnosis not present

## 2017-04-10 DIAGNOSIS — R1312 Dysphagia, oropharyngeal phase: Secondary | ICD-10-CM | POA: Diagnosis not present

## 2017-04-10 DIAGNOSIS — M10072 Idiopathic gout, left ankle and foot: Secondary | ICD-10-CM | POA: Diagnosis not present

## 2017-04-10 DIAGNOSIS — K8020 Calculus of gallbladder without cholecystitis without obstruction: Secondary | ICD-10-CM | POA: Diagnosis not present

## 2017-04-10 DIAGNOSIS — K219 Gastro-esophageal reflux disease without esophagitis: Secondary | ICD-10-CM | POA: Diagnosis not present

## 2017-04-10 DIAGNOSIS — G4733 Obstructive sleep apnea (adult) (pediatric): Secondary | ICD-10-CM | POA: Diagnosis not present

## 2017-04-10 DIAGNOSIS — K5730 Diverticulosis of large intestine without perforation or abscess without bleeding: Secondary | ICD-10-CM | POA: Diagnosis not present

## 2017-04-11 ENCOUNTER — Telehealth: Payer: Self-pay | Admitting: Cardiovascular Disease

## 2017-04-11 DIAGNOSIS — K5730 Diverticulosis of large intestine without perforation or abscess without bleeding: Secondary | ICD-10-CM | POA: Diagnosis not present

## 2017-04-11 DIAGNOSIS — I951 Orthostatic hypotension: Secondary | ICD-10-CM | POA: Diagnosis not present

## 2017-04-11 DIAGNOSIS — K314 Gastric diverticulum: Secondary | ICD-10-CM | POA: Diagnosis not present

## 2017-04-11 DIAGNOSIS — R1312 Dysphagia, oropharyngeal phase: Secondary | ICD-10-CM | POA: Diagnosis not present

## 2017-04-11 DIAGNOSIS — Z9181 History of falling: Secondary | ICD-10-CM | POA: Diagnosis not present

## 2017-04-11 DIAGNOSIS — K8020 Calculus of gallbladder without cholecystitis without obstruction: Secondary | ICD-10-CM | POA: Diagnosis not present

## 2017-04-11 DIAGNOSIS — K219 Gastro-esophageal reflux disease without esophagitis: Secondary | ICD-10-CM | POA: Diagnosis not present

## 2017-04-11 DIAGNOSIS — M6281 Muscle weakness (generalized): Secondary | ICD-10-CM | POA: Diagnosis not present

## 2017-04-11 DIAGNOSIS — G4733 Obstructive sleep apnea (adult) (pediatric): Secondary | ICD-10-CM | POA: Diagnosis not present

## 2017-04-11 DIAGNOSIS — M10072 Idiopathic gout, left ankle and foot: Secondary | ICD-10-CM | POA: Diagnosis not present

## 2017-04-11 NOTE — Telephone Encounter (Signed)
Attempted to contact pt regarding myoview instructions.  No answer. Will call again.

## 2017-04-12 DIAGNOSIS — Z9181 History of falling: Secondary | ICD-10-CM | POA: Diagnosis not present

## 2017-04-12 DIAGNOSIS — K219 Gastro-esophageal reflux disease without esophagitis: Secondary | ICD-10-CM | POA: Diagnosis not present

## 2017-04-12 DIAGNOSIS — M10072 Idiopathic gout, left ankle and foot: Secondary | ICD-10-CM | POA: Diagnosis not present

## 2017-04-12 DIAGNOSIS — K314 Gastric diverticulum: Secondary | ICD-10-CM | POA: Diagnosis not present

## 2017-04-12 DIAGNOSIS — M6281 Muscle weakness (generalized): Secondary | ICD-10-CM | POA: Diagnosis not present

## 2017-04-12 DIAGNOSIS — K5730 Diverticulosis of large intestine without perforation or abscess without bleeding: Secondary | ICD-10-CM | POA: Diagnosis not present

## 2017-04-12 DIAGNOSIS — G4733 Obstructive sleep apnea (adult) (pediatric): Secondary | ICD-10-CM | POA: Diagnosis not present

## 2017-04-12 DIAGNOSIS — I951 Orthostatic hypotension: Secondary | ICD-10-CM | POA: Diagnosis not present

## 2017-04-12 DIAGNOSIS — K8020 Calculus of gallbladder without cholecystitis without obstruction: Secondary | ICD-10-CM | POA: Diagnosis not present

## 2017-04-12 DIAGNOSIS — R1312 Dysphagia, oropharyngeal phase: Secondary | ICD-10-CM | POA: Diagnosis not present

## 2017-04-13 ENCOUNTER — Encounter
Admission: RE | Admit: 2017-04-13 | Discharge: 2017-04-13 | Disposition: A | Discharge status: Home / Self Care | Payer: Medicare Other | Source: Ambulatory Visit | Attending: Cardiovascular Disease | Admitting: Cardiovascular Disease

## 2017-04-13 DIAGNOSIS — Z9181 History of falling: Secondary | ICD-10-CM | POA: Diagnosis not present

## 2017-04-13 DIAGNOSIS — K219 Gastro-esophageal reflux disease without esophagitis: Secondary | ICD-10-CM | POA: Diagnosis not present

## 2017-04-13 DIAGNOSIS — R0602 Shortness of breath: Secondary | ICD-10-CM | POA: Diagnosis not present

## 2017-04-13 DIAGNOSIS — K5730 Diverticulosis of large intestine without perforation or abscess without bleeding: Secondary | ICD-10-CM | POA: Diagnosis not present

## 2017-04-13 DIAGNOSIS — R1312 Dysphagia, oropharyngeal phase: Secondary | ICD-10-CM | POA: Diagnosis not present

## 2017-04-13 DIAGNOSIS — K314 Gastric diverticulum: Secondary | ICD-10-CM | POA: Diagnosis not present

## 2017-04-13 DIAGNOSIS — I951 Orthostatic hypotension: Secondary | ICD-10-CM | POA: Diagnosis not present

## 2017-04-13 DIAGNOSIS — M6281 Muscle weakness (generalized): Secondary | ICD-10-CM | POA: Diagnosis not present

## 2017-04-13 DIAGNOSIS — G4733 Obstructive sleep apnea (adult) (pediatric): Secondary | ICD-10-CM | POA: Diagnosis not present

## 2017-04-13 DIAGNOSIS — M10072 Idiopathic gout, left ankle and foot: Secondary | ICD-10-CM | POA: Diagnosis not present

## 2017-04-13 DIAGNOSIS — K8020 Calculus of gallbladder without cholecystitis without obstruction: Secondary | ICD-10-CM | POA: Diagnosis not present

## 2017-04-13 LAB — NM MYOCAR MULTI W/SPECT W/WALL MOTION / EF
Estimated workload: 1 METS
Exercise duration (min): 0 min
Exercise duration (sec): 0 s
LV dias vol: 80 mL (ref 46–106)
LV sys vol: 53 mL
MPHR: 150 {beats}/min
Peak HR: 95 {beats}/min
Percent HR: 63 %
Rest HR: 76 {beats}/min
SDS: 0
SRS: 3
SSS: 0
TID: 1.13

## 2017-04-13 MED ORDER — TECHNETIUM TC 99M TETROFOSMIN IV KIT
31.8600 | PACK | Freq: Once | INTRAVENOUS | Status: AC | PRN
  Administered 2017-04-13: 31.86 via INTRAVENOUS

## 2017-04-13 MED ORDER — TECHNETIUM TC 99M TETROFOSMIN IV KIT
11.8000 | PACK | Freq: Once | INTRAVENOUS | Status: AC | PRN
  Administered 2017-04-13: 11.8 via INTRAVENOUS

## 2017-04-13 MED ORDER — REGADENOSON 0.4 MG/5ML IV SOLN
0.4000 mg | Freq: Once | INTRAVENOUS | Status: AC
  Administered 2017-04-13: 0.4 mg via INTRAVENOUS

## 2017-04-14 ENCOUNTER — Ambulatory Visit (INDEPENDENT_AMBULATORY_CARE_PROVIDER_SITE_OTHER): Payer: Medicare Other

## 2017-04-14 ENCOUNTER — Other Ambulatory Visit: Payer: Self-pay

## 2017-04-14 DIAGNOSIS — G4733 Obstructive sleep apnea (adult) (pediatric): Secondary | ICD-10-CM | POA: Diagnosis not present

## 2017-04-14 DIAGNOSIS — R011 Cardiac murmur, unspecified: Secondary | ICD-10-CM

## 2017-04-14 DIAGNOSIS — R1312 Dysphagia, oropharyngeal phase: Secondary | ICD-10-CM | POA: Diagnosis not present

## 2017-04-14 DIAGNOSIS — Z9181 History of falling: Secondary | ICD-10-CM | POA: Diagnosis not present

## 2017-04-14 DIAGNOSIS — K314 Gastric diverticulum: Secondary | ICD-10-CM | POA: Diagnosis not present

## 2017-04-14 DIAGNOSIS — M6281 Muscle weakness (generalized): Secondary | ICD-10-CM | POA: Diagnosis not present

## 2017-04-14 DIAGNOSIS — K8020 Calculus of gallbladder without cholecystitis without obstruction: Secondary | ICD-10-CM | POA: Diagnosis not present

## 2017-04-14 DIAGNOSIS — I951 Orthostatic hypotension: Secondary | ICD-10-CM | POA: Diagnosis not present

## 2017-04-14 DIAGNOSIS — K219 Gastro-esophageal reflux disease without esophagitis: Secondary | ICD-10-CM | POA: Diagnosis not present

## 2017-04-14 DIAGNOSIS — K5730 Diverticulosis of large intestine without perforation or abscess without bleeding: Secondary | ICD-10-CM | POA: Diagnosis not present

## 2017-04-14 DIAGNOSIS — M10072 Idiopathic gout, left ankle and foot: Secondary | ICD-10-CM | POA: Diagnosis not present

## 2017-04-18 DIAGNOSIS — K8020 Calculus of gallbladder without cholecystitis without obstruction: Secondary | ICD-10-CM | POA: Diagnosis not present

## 2017-04-18 DIAGNOSIS — K5730 Diverticulosis of large intestine without perforation or abscess without bleeding: Secondary | ICD-10-CM | POA: Diagnosis not present

## 2017-04-18 DIAGNOSIS — M10072 Idiopathic gout, left ankle and foot: Secondary | ICD-10-CM | POA: Diagnosis not present

## 2017-04-18 DIAGNOSIS — R1312 Dysphagia, oropharyngeal phase: Secondary | ICD-10-CM | POA: Diagnosis not present

## 2017-04-18 DIAGNOSIS — M6281 Muscle weakness (generalized): Secondary | ICD-10-CM | POA: Diagnosis not present

## 2017-04-18 DIAGNOSIS — K314 Gastric diverticulum: Secondary | ICD-10-CM | POA: Diagnosis not present

## 2017-04-18 DIAGNOSIS — Z9181 History of falling: Secondary | ICD-10-CM | POA: Diagnosis not present

## 2017-04-18 DIAGNOSIS — G4733 Obstructive sleep apnea (adult) (pediatric): Secondary | ICD-10-CM | POA: Diagnosis not present

## 2017-04-18 DIAGNOSIS — K219 Gastro-esophageal reflux disease without esophagitis: Secondary | ICD-10-CM | POA: Diagnosis not present

## 2017-04-18 DIAGNOSIS — I951 Orthostatic hypotension: Secondary | ICD-10-CM | POA: Diagnosis not present

## 2017-04-19 DIAGNOSIS — K8020 Calculus of gallbladder without cholecystitis without obstruction: Secondary | ICD-10-CM | POA: Diagnosis not present

## 2017-04-19 DIAGNOSIS — Z9181 History of falling: Secondary | ICD-10-CM | POA: Diagnosis not present

## 2017-04-19 DIAGNOSIS — K5730 Diverticulosis of large intestine without perforation or abscess without bleeding: Secondary | ICD-10-CM | POA: Diagnosis not present

## 2017-04-19 DIAGNOSIS — M10072 Idiopathic gout, left ankle and foot: Secondary | ICD-10-CM | POA: Diagnosis not present

## 2017-04-19 DIAGNOSIS — G4733 Obstructive sleep apnea (adult) (pediatric): Secondary | ICD-10-CM | POA: Diagnosis not present

## 2017-04-19 DIAGNOSIS — R1312 Dysphagia, oropharyngeal phase: Secondary | ICD-10-CM | POA: Diagnosis not present

## 2017-04-19 DIAGNOSIS — I951 Orthostatic hypotension: Secondary | ICD-10-CM | POA: Diagnosis not present

## 2017-04-19 DIAGNOSIS — K314 Gastric diverticulum: Secondary | ICD-10-CM | POA: Diagnosis not present

## 2017-04-19 DIAGNOSIS — K219 Gastro-esophageal reflux disease without esophagitis: Secondary | ICD-10-CM | POA: Diagnosis not present

## 2017-04-19 DIAGNOSIS — M6281 Muscle weakness (generalized): Secondary | ICD-10-CM | POA: Diagnosis not present

## 2017-04-20 DIAGNOSIS — M10072 Idiopathic gout, left ankle and foot: Secondary | ICD-10-CM | POA: Diagnosis not present

## 2017-04-20 DIAGNOSIS — Z9181 History of falling: Secondary | ICD-10-CM | POA: Diagnosis not present

## 2017-04-20 DIAGNOSIS — I951 Orthostatic hypotension: Secondary | ICD-10-CM | POA: Diagnosis not present

## 2017-04-20 DIAGNOSIS — M6281 Muscle weakness (generalized): Secondary | ICD-10-CM | POA: Diagnosis not present

## 2017-04-20 DIAGNOSIS — R1312 Dysphagia, oropharyngeal phase: Secondary | ICD-10-CM | POA: Diagnosis not present

## 2017-04-20 DIAGNOSIS — K5730 Diverticulosis of large intestine without perforation or abscess without bleeding: Secondary | ICD-10-CM | POA: Diagnosis not present

## 2017-04-20 DIAGNOSIS — G4733 Obstructive sleep apnea (adult) (pediatric): Secondary | ICD-10-CM | POA: Diagnosis not present

## 2017-04-20 DIAGNOSIS — K8020 Calculus of gallbladder without cholecystitis without obstruction: Secondary | ICD-10-CM | POA: Diagnosis not present

## 2017-04-20 DIAGNOSIS — K219 Gastro-esophageal reflux disease without esophagitis: Secondary | ICD-10-CM | POA: Diagnosis not present

## 2017-04-20 DIAGNOSIS — K314 Gastric diverticulum: Secondary | ICD-10-CM | POA: Diagnosis not present

## 2017-04-21 DIAGNOSIS — M6281 Muscle weakness (generalized): Secondary | ICD-10-CM | POA: Diagnosis not present

## 2017-04-21 DIAGNOSIS — K219 Gastro-esophageal reflux disease without esophagitis: Secondary | ICD-10-CM | POA: Diagnosis not present

## 2017-04-21 DIAGNOSIS — G4733 Obstructive sleep apnea (adult) (pediatric): Secondary | ICD-10-CM | POA: Diagnosis not present

## 2017-04-21 DIAGNOSIS — R1312 Dysphagia, oropharyngeal phase: Secondary | ICD-10-CM | POA: Diagnosis not present

## 2017-04-21 DIAGNOSIS — K5730 Diverticulosis of large intestine without perforation or abscess without bleeding: Secondary | ICD-10-CM | POA: Diagnosis not present

## 2017-04-21 DIAGNOSIS — M10072 Idiopathic gout, left ankle and foot: Secondary | ICD-10-CM | POA: Diagnosis not present

## 2017-04-21 DIAGNOSIS — I951 Orthostatic hypotension: Secondary | ICD-10-CM | POA: Diagnosis not present

## 2017-04-21 DIAGNOSIS — Z9181 History of falling: Secondary | ICD-10-CM | POA: Diagnosis not present

## 2017-04-21 DIAGNOSIS — K8020 Calculus of gallbladder without cholecystitis without obstruction: Secondary | ICD-10-CM | POA: Diagnosis not present

## 2017-04-21 DIAGNOSIS — K314 Gastric diverticulum: Secondary | ICD-10-CM | POA: Diagnosis not present

## 2017-04-25 DIAGNOSIS — G4733 Obstructive sleep apnea (adult) (pediatric): Secondary | ICD-10-CM | POA: Diagnosis not present

## 2017-04-25 DIAGNOSIS — I951 Orthostatic hypotension: Secondary | ICD-10-CM | POA: Diagnosis not present

## 2017-04-25 DIAGNOSIS — Z9181 History of falling: Secondary | ICD-10-CM | POA: Diagnosis not present

## 2017-04-25 DIAGNOSIS — R1312 Dysphagia, oropharyngeal phase: Secondary | ICD-10-CM | POA: Diagnosis not present

## 2017-04-25 DIAGNOSIS — M6281 Muscle weakness (generalized): Secondary | ICD-10-CM | POA: Diagnosis not present

## 2017-04-25 DIAGNOSIS — K5730 Diverticulosis of large intestine without perforation or abscess without bleeding: Secondary | ICD-10-CM | POA: Diagnosis not present

## 2017-04-25 DIAGNOSIS — K8020 Calculus of gallbladder without cholecystitis without obstruction: Secondary | ICD-10-CM | POA: Diagnosis not present

## 2017-04-25 DIAGNOSIS — M10072 Idiopathic gout, left ankle and foot: Secondary | ICD-10-CM | POA: Diagnosis not present

## 2017-04-25 DIAGNOSIS — K219 Gastro-esophageal reflux disease without esophagitis: Secondary | ICD-10-CM | POA: Diagnosis not present

## 2017-04-25 DIAGNOSIS — K314 Gastric diverticulum: Secondary | ICD-10-CM | POA: Diagnosis not present

## 2017-04-27 DIAGNOSIS — K5730 Diverticulosis of large intestine without perforation or abscess without bleeding: Secondary | ICD-10-CM | POA: Diagnosis not present

## 2017-04-27 DIAGNOSIS — M10072 Idiopathic gout, left ankle and foot: Secondary | ICD-10-CM | POA: Diagnosis not present

## 2017-04-27 DIAGNOSIS — G4733 Obstructive sleep apnea (adult) (pediatric): Secondary | ICD-10-CM | POA: Diagnosis not present

## 2017-04-27 DIAGNOSIS — K219 Gastro-esophageal reflux disease without esophagitis: Secondary | ICD-10-CM | POA: Diagnosis not present

## 2017-04-27 DIAGNOSIS — K8020 Calculus of gallbladder without cholecystitis without obstruction: Secondary | ICD-10-CM | POA: Diagnosis not present

## 2017-04-27 DIAGNOSIS — R1312 Dysphagia, oropharyngeal phase: Secondary | ICD-10-CM | POA: Diagnosis not present

## 2017-04-27 DIAGNOSIS — I951 Orthostatic hypotension: Secondary | ICD-10-CM | POA: Diagnosis not present

## 2017-04-27 DIAGNOSIS — Z9181 History of falling: Secondary | ICD-10-CM | POA: Diagnosis not present

## 2017-04-27 DIAGNOSIS — K314 Gastric diverticulum: Secondary | ICD-10-CM | POA: Diagnosis not present

## 2017-04-27 DIAGNOSIS — M6281 Muscle weakness (generalized): Secondary | ICD-10-CM | POA: Diagnosis not present

## 2017-04-28 DIAGNOSIS — G4733 Obstructive sleep apnea (adult) (pediatric): Secondary | ICD-10-CM | POA: Diagnosis not present

## 2017-04-28 DIAGNOSIS — I951 Orthostatic hypotension: Secondary | ICD-10-CM | POA: Diagnosis not present

## 2017-04-28 DIAGNOSIS — K219 Gastro-esophageal reflux disease without esophagitis: Secondary | ICD-10-CM | POA: Diagnosis not present

## 2017-04-28 DIAGNOSIS — Z9181 History of falling: Secondary | ICD-10-CM | POA: Diagnosis not present

## 2017-04-28 DIAGNOSIS — M10072 Idiopathic gout, left ankle and foot: Secondary | ICD-10-CM | POA: Diagnosis not present

## 2017-04-28 DIAGNOSIS — K314 Gastric diverticulum: Secondary | ICD-10-CM | POA: Diagnosis not present

## 2017-04-28 DIAGNOSIS — R1312 Dysphagia, oropharyngeal phase: Secondary | ICD-10-CM | POA: Diagnosis not present

## 2017-04-28 DIAGNOSIS — K8020 Calculus of gallbladder without cholecystitis without obstruction: Secondary | ICD-10-CM | POA: Diagnosis not present

## 2017-04-28 DIAGNOSIS — M6281 Muscle weakness (generalized): Secondary | ICD-10-CM | POA: Diagnosis not present

## 2017-04-28 DIAGNOSIS — K5730 Diverticulosis of large intestine without perforation or abscess without bleeding: Secondary | ICD-10-CM | POA: Diagnosis not present

## 2017-04-29 DIAGNOSIS — G4733 Obstructive sleep apnea (adult) (pediatric): Secondary | ICD-10-CM | POA: Diagnosis not present

## 2017-05-01 ENCOUNTER — Ambulatory Visit
Admission: RE | Admit: 2017-05-01 | Discharge: 2017-05-01 | Disposition: A | Discharge status: Home / Self Care | Payer: Medicare Other | Source: Ambulatory Visit | Attending: Internal Medicine | Admitting: Internal Medicine

## 2017-05-01 DIAGNOSIS — Z1231 Encounter for screening mammogram for malignant neoplasm of breast: Secondary | ICD-10-CM

## 2017-05-02 DIAGNOSIS — I951 Orthostatic hypotension: Secondary | ICD-10-CM | POA: Diagnosis not present

## 2017-05-02 DIAGNOSIS — K219 Gastro-esophageal reflux disease without esophagitis: Secondary | ICD-10-CM | POA: Diagnosis not present

## 2017-05-02 DIAGNOSIS — M6281 Muscle weakness (generalized): Secondary | ICD-10-CM | POA: Diagnosis not present

## 2017-05-02 DIAGNOSIS — Z9181 History of falling: Secondary | ICD-10-CM | POA: Diagnosis not present

## 2017-05-02 DIAGNOSIS — G4733 Obstructive sleep apnea (adult) (pediatric): Secondary | ICD-10-CM | POA: Diagnosis not present

## 2017-05-02 DIAGNOSIS — R1312 Dysphagia, oropharyngeal phase: Secondary | ICD-10-CM | POA: Diagnosis not present

## 2017-05-02 DIAGNOSIS — K5730 Diverticulosis of large intestine without perforation or abscess without bleeding: Secondary | ICD-10-CM | POA: Diagnosis not present

## 2017-05-02 DIAGNOSIS — K314 Gastric diverticulum: Secondary | ICD-10-CM | POA: Diagnosis not present

## 2017-05-02 DIAGNOSIS — M10072 Idiopathic gout, left ankle and foot: Secondary | ICD-10-CM | POA: Diagnosis not present

## 2017-05-02 DIAGNOSIS — K8020 Calculus of gallbladder without cholecystitis without obstruction: Secondary | ICD-10-CM | POA: Diagnosis not present

## 2017-05-03 DIAGNOSIS — I951 Orthostatic hypotension: Secondary | ICD-10-CM | POA: Diagnosis not present

## 2017-05-03 DIAGNOSIS — Z9181 History of falling: Secondary | ICD-10-CM | POA: Diagnosis not present

## 2017-05-03 DIAGNOSIS — M10072 Idiopathic gout, left ankle and foot: Secondary | ICD-10-CM | POA: Diagnosis not present

## 2017-05-03 DIAGNOSIS — M6281 Muscle weakness (generalized): Secondary | ICD-10-CM | POA: Diagnosis not present

## 2017-05-03 DIAGNOSIS — K8020 Calculus of gallbladder without cholecystitis without obstruction: Secondary | ICD-10-CM | POA: Diagnosis not present

## 2017-05-03 DIAGNOSIS — G4733 Obstructive sleep apnea (adult) (pediatric): Secondary | ICD-10-CM | POA: Diagnosis not present

## 2017-05-03 DIAGNOSIS — R1312 Dysphagia, oropharyngeal phase: Secondary | ICD-10-CM | POA: Diagnosis not present

## 2017-05-03 DIAGNOSIS — K5730 Diverticulosis of large intestine without perforation or abscess without bleeding: Secondary | ICD-10-CM | POA: Diagnosis not present

## 2017-05-03 DIAGNOSIS — K219 Gastro-esophageal reflux disease without esophagitis: Secondary | ICD-10-CM | POA: Diagnosis not present

## 2017-05-03 DIAGNOSIS — K314 Gastric diverticulum: Secondary | ICD-10-CM | POA: Diagnosis not present

## 2017-05-04 DIAGNOSIS — I951 Orthostatic hypotension: Secondary | ICD-10-CM | POA: Diagnosis not present

## 2017-05-04 DIAGNOSIS — K5730 Diverticulosis of large intestine without perforation or abscess without bleeding: Secondary | ICD-10-CM | POA: Diagnosis not present

## 2017-05-04 DIAGNOSIS — M6281 Muscle weakness (generalized): Secondary | ICD-10-CM | POA: Diagnosis not present

## 2017-05-04 DIAGNOSIS — K219 Gastro-esophageal reflux disease without esophagitis: Secondary | ICD-10-CM | POA: Diagnosis not present

## 2017-05-04 DIAGNOSIS — G4733 Obstructive sleep apnea (adult) (pediatric): Secondary | ICD-10-CM | POA: Diagnosis not present

## 2017-05-04 DIAGNOSIS — R1312 Dysphagia, oropharyngeal phase: Secondary | ICD-10-CM | POA: Diagnosis not present

## 2017-05-04 DIAGNOSIS — K314 Gastric diverticulum: Secondary | ICD-10-CM | POA: Diagnosis not present

## 2017-05-04 DIAGNOSIS — Z9181 History of falling: Secondary | ICD-10-CM | POA: Diagnosis not present

## 2017-05-04 DIAGNOSIS — M10072 Idiopathic gout, left ankle and foot: Secondary | ICD-10-CM | POA: Diagnosis not present

## 2017-05-04 DIAGNOSIS — K8020 Calculus of gallbladder without cholecystitis without obstruction: Secondary | ICD-10-CM | POA: Diagnosis not present

## 2017-05-05 DIAGNOSIS — R1312 Dysphagia, oropharyngeal phase: Secondary | ICD-10-CM | POA: Diagnosis not present

## 2017-05-05 DIAGNOSIS — K219 Gastro-esophageal reflux disease without esophagitis: Secondary | ICD-10-CM | POA: Diagnosis not present

## 2017-05-05 DIAGNOSIS — M6281 Muscle weakness (generalized): Secondary | ICD-10-CM | POA: Diagnosis not present

## 2017-05-05 DIAGNOSIS — M10072 Idiopathic gout, left ankle and foot: Secondary | ICD-10-CM | POA: Diagnosis not present

## 2017-05-05 DIAGNOSIS — Z9181 History of falling: Secondary | ICD-10-CM | POA: Diagnosis not present

## 2017-05-05 DIAGNOSIS — I951 Orthostatic hypotension: Secondary | ICD-10-CM | POA: Diagnosis not present

## 2017-05-05 DIAGNOSIS — K314 Gastric diverticulum: Secondary | ICD-10-CM | POA: Diagnosis not present

## 2017-05-05 DIAGNOSIS — K5730 Diverticulosis of large intestine without perforation or abscess without bleeding: Secondary | ICD-10-CM | POA: Diagnosis not present

## 2017-05-05 DIAGNOSIS — K8020 Calculus of gallbladder without cholecystitis without obstruction: Secondary | ICD-10-CM | POA: Diagnosis not present

## 2017-05-05 DIAGNOSIS — G4733 Obstructive sleep apnea (adult) (pediatric): Secondary | ICD-10-CM | POA: Diagnosis not present

## 2017-05-09 DIAGNOSIS — M10072 Idiopathic gout, left ankle and foot: Secondary | ICD-10-CM | POA: Diagnosis not present

## 2017-05-09 DIAGNOSIS — Z9181 History of falling: Secondary | ICD-10-CM | POA: Diagnosis not present

## 2017-05-09 DIAGNOSIS — G4733 Obstructive sleep apnea (adult) (pediatric): Secondary | ICD-10-CM | POA: Diagnosis not present

## 2017-05-09 DIAGNOSIS — K5730 Diverticulosis of large intestine without perforation or abscess without bleeding: Secondary | ICD-10-CM | POA: Diagnosis not present

## 2017-05-09 DIAGNOSIS — M6281 Muscle weakness (generalized): Secondary | ICD-10-CM | POA: Diagnosis not present

## 2017-05-09 DIAGNOSIS — K8020 Calculus of gallbladder without cholecystitis without obstruction: Secondary | ICD-10-CM | POA: Diagnosis not present

## 2017-05-09 DIAGNOSIS — I951 Orthostatic hypotension: Secondary | ICD-10-CM | POA: Diagnosis not present

## 2017-05-09 DIAGNOSIS — K219 Gastro-esophageal reflux disease without esophagitis: Secondary | ICD-10-CM | POA: Diagnosis not present

## 2017-05-09 DIAGNOSIS — K314 Gastric diverticulum: Secondary | ICD-10-CM | POA: Diagnosis not present

## 2017-05-09 DIAGNOSIS — R1312 Dysphagia, oropharyngeal phase: Secondary | ICD-10-CM | POA: Diagnosis not present

## 2017-05-11 DIAGNOSIS — K219 Gastro-esophageal reflux disease without esophagitis: Secondary | ICD-10-CM | POA: Diagnosis not present

## 2017-05-11 DIAGNOSIS — Z9181 History of falling: Secondary | ICD-10-CM | POA: Diagnosis not present

## 2017-05-11 DIAGNOSIS — K314 Gastric diverticulum: Secondary | ICD-10-CM | POA: Diagnosis not present

## 2017-05-11 DIAGNOSIS — G4733 Obstructive sleep apnea (adult) (pediatric): Secondary | ICD-10-CM | POA: Diagnosis not present

## 2017-05-11 DIAGNOSIS — R1312 Dysphagia, oropharyngeal phase: Secondary | ICD-10-CM | POA: Diagnosis not present

## 2017-05-11 DIAGNOSIS — K8020 Calculus of gallbladder without cholecystitis without obstruction: Secondary | ICD-10-CM | POA: Diagnosis not present

## 2017-05-11 DIAGNOSIS — M10072 Idiopathic gout, left ankle and foot: Secondary | ICD-10-CM | POA: Diagnosis not present

## 2017-05-11 DIAGNOSIS — M6281 Muscle weakness (generalized): Secondary | ICD-10-CM | POA: Diagnosis not present

## 2017-05-11 DIAGNOSIS — K5730 Diverticulosis of large intestine without perforation or abscess without bleeding: Secondary | ICD-10-CM | POA: Diagnosis not present

## 2017-05-11 DIAGNOSIS — I951 Orthostatic hypotension: Secondary | ICD-10-CM | POA: Diagnosis not present

## 2017-05-12 DIAGNOSIS — M10072 Idiopathic gout, left ankle and foot: Secondary | ICD-10-CM | POA: Diagnosis not present

## 2017-05-12 DIAGNOSIS — M6281 Muscle weakness (generalized): Secondary | ICD-10-CM | POA: Diagnosis not present

## 2017-05-12 DIAGNOSIS — Z9181 History of falling: Secondary | ICD-10-CM | POA: Diagnosis not present

## 2017-05-12 DIAGNOSIS — K219 Gastro-esophageal reflux disease without esophagitis: Secondary | ICD-10-CM | POA: Diagnosis not present

## 2017-05-12 DIAGNOSIS — K5730 Diverticulosis of large intestine without perforation or abscess without bleeding: Secondary | ICD-10-CM | POA: Diagnosis not present

## 2017-05-12 DIAGNOSIS — G4733 Obstructive sleep apnea (adult) (pediatric): Secondary | ICD-10-CM | POA: Diagnosis not present

## 2017-05-12 DIAGNOSIS — K314 Gastric diverticulum: Secondary | ICD-10-CM | POA: Diagnosis not present

## 2017-05-12 DIAGNOSIS — K8020 Calculus of gallbladder without cholecystitis without obstruction: Secondary | ICD-10-CM | POA: Diagnosis not present

## 2017-05-12 DIAGNOSIS — R1312 Dysphagia, oropharyngeal phase: Secondary | ICD-10-CM | POA: Diagnosis not present

## 2017-05-12 DIAGNOSIS — I951 Orthostatic hypotension: Secondary | ICD-10-CM | POA: Diagnosis not present

## 2017-05-16 ENCOUNTER — Ambulatory Visit: Payer: Medicare Other | Admitting: Internal Medicine

## 2017-05-17 DIAGNOSIS — R1312 Dysphagia, oropharyngeal phase: Secondary | ICD-10-CM | POA: Diagnosis not present

## 2017-05-17 DIAGNOSIS — M10072 Idiopathic gout, left ankle and foot: Secondary | ICD-10-CM | POA: Diagnosis not present

## 2017-05-17 DIAGNOSIS — I951 Orthostatic hypotension: Secondary | ICD-10-CM | POA: Diagnosis not present

## 2017-05-17 DIAGNOSIS — K314 Gastric diverticulum: Secondary | ICD-10-CM | POA: Diagnosis not present

## 2017-05-17 DIAGNOSIS — Z9181 History of falling: Secondary | ICD-10-CM | POA: Diagnosis not present

## 2017-05-17 DIAGNOSIS — M6281 Muscle weakness (generalized): Secondary | ICD-10-CM | POA: Diagnosis not present

## 2017-05-17 DIAGNOSIS — K5730 Diverticulosis of large intestine without perforation or abscess without bleeding: Secondary | ICD-10-CM | POA: Diagnosis not present

## 2017-05-17 DIAGNOSIS — K219 Gastro-esophageal reflux disease without esophagitis: Secondary | ICD-10-CM | POA: Diagnosis not present

## 2017-05-17 DIAGNOSIS — K8020 Calculus of gallbladder without cholecystitis without obstruction: Secondary | ICD-10-CM | POA: Diagnosis not present

## 2017-05-17 DIAGNOSIS — G4733 Obstructive sleep apnea (adult) (pediatric): Secondary | ICD-10-CM | POA: Diagnosis not present

## 2017-05-18 ENCOUNTER — Encounter: Payer: Self-pay | Admitting: Internal Medicine

## 2017-05-18 ENCOUNTER — Ambulatory Visit (INDEPENDENT_AMBULATORY_CARE_PROVIDER_SITE_OTHER): Payer: Medicare Other | Admitting: Cardiovascular Disease

## 2017-05-18 ENCOUNTER — Encounter: Payer: Self-pay | Admitting: Cardiovascular Disease

## 2017-05-18 VITALS — BP 150/80 | HR 78 | Ht 65.5 in | Wt 233.2 lb

## 2017-05-18 DIAGNOSIS — I10 Essential (primary) hypertension: Secondary | ICD-10-CM | POA: Diagnosis not present

## 2017-05-18 DIAGNOSIS — R0602 Shortness of breath: Secondary | ICD-10-CM | POA: Diagnosis not present

## 2017-05-18 DIAGNOSIS — R010 Benign and innocent cardiac murmurs: Secondary | ICD-10-CM | POA: Insufficient documentation

## 2017-05-18 NOTE — Progress Notes (Signed)
Cardiology Office Note   Date:  05/18/2017   ID:  Taylor Johnson, DOB 21-Nov-1945, MRN [id number]  PCP:  [name], MD  Cardiologist:   Kathlyn Sacramento, MD   Chief Complaint  Patient presents with  . other    Follow up of Echo & Myoview. Meds reviewed by the pt. verbally. Pt. c/o shortness of breath.       History of Present Illness: Taylor Johnson is a 71 y.o. female who is here today for follow-up visit regarding shortness of breath. She has chronic medical conditions that include asthma, sleep apnea on CPAP and essential hypertension. She was hospitalized in July at [facility] with abdominal pain. CT scan showed severe diverticulosis without diverticulitis. The patient had significant dysphagia and was seen by gastroenterology. EGD was unremarkable. She was discharged to rehabilitation due to significant deconditioning. The patient had orthostatic hypotension before hospital discharge and was sent home on Midodrin.   She was seen for significant dyspnea and leg edema.  She underwent a pharmacologic nuclear stress test which was normal.  She was noted to have a cardiac murmur and underwent an echocardiogram which showed an EF of 67-89%, grade 1 diastolic dysfunction, mild LVOT gradient with a peak gradient of 19 mmHg.  She has been doing reasonably well with improved shortness of breath and no chest pain.  She has mild leg edema.  Past Medical History:  Diagnosis Date  . Anxiety   . Anxiety, generalized 05/11/2015  . Asthma   . Cough 06/02/2016   Chronic - followed by Pulmonary  . Depression   . Developmental delay   . Diverticulitis   . Diverticulosis of colon 05/11/2015  . Dysphagia 10/15/2015   Routine Ba Swallow normal; rec modified barium swallow   . Hypoxia 09/12/2015  . Leg swelling   . Localized edema 04/15/2016  . Microscopic hematuria 04/15/2016  . OSA on CPAP 11/18/2015   CPAP @ 18 cm H2O started 02/2016  . Urge incontinence of urine 05/12/2015    Past Surgical  History:  Procedure Laterality Date  . ABDOMINAL HYSTERECTOMY    . BREAST BIOPSY Left    neg  . COLON SURGERY    . ESOPHAGOGASTRODUODENOSCOPY (EGD) WITH PROPOFOL N/A 01/29/2017   Procedure: ESOPHAGOGASTRODUODENOSCOPY (EGD) WITH PROPOFOL;  Surgeon: Wilford Corner, MD;  Location: [facility] ENDOSCOPY;  Service: Endoscopy;  Laterality: N/A;     Current Outpatient Prescriptions  Medication Sig Dispense Refill  . chlorthalidone (HYGROTON) 25 MG tablet Take 25 mg by mouth daily.    . colchicine 0.6 MG tablet Take 0.6 mg by mouth daily.    [name] docusate sodium (COLACE) 100 MG capsule Take 100 mg by mouth 2 (two) times daily.    [name] esomeprazole (NEXIUM) 40 MG capsule Take 1 capsule (40 mg total) by mouth daily at 12 noon. 30 capsule 5  . HYDROcodone-acetaminophen (NORCO) 5-325 MG tablet Take 1 tablet by mouth every 6 (six) hours as needed for moderate pain. 20 tablet 0  . hyoscyamine (LEVSIN) 0.125 MG/ML solution Take 0.125 mg by mouth every 4 (four) hours as needed.    . metroNIDAZOLE (FLAGYL) 500 MG tablet Take 1 tablet (500 mg total) by mouth 2 (two) times daily. 20 tablet 0  . midodrine (PROAMATINE) 5 MG tablet Take 1 tablet (5 mg total) by mouth 2 (two) times daily with a meal. 60 tablet 0  . ondansetron (ZOFRAN) 4 MG tablet Take 1 tablet (4 mg total) by mouth every 6 (six) hours  as needed for nausea. 20 tablet 0  . potassium chloride SA (K-DUR,KLOR-CON) 20 MEQ tablet Take 20 mEq by mouth 2 (two) times daily.     No current facility-administered medications for this visit.     Allergies:   Patient has no known allergies.    Social History:  The patient  reports that she has never smoked. She has never used smokeless tobacco. She reports that she does not drink alcohol or use drugs.   Family History:  The patient's family history includes Hypertension in her mother; Kidney disease in her mother.    ROS:  Please see the history of present illness.   Otherwise, review of systems are positive for  none.   All other systems are reviewed and negative.    PHYSICAL EXAM: VS:  BP (!) 150/80 (BP Location: Left Arm, Patient Position: Sitting, Cuff Size: Normal)   Pulse 78   Ht 5' 5.5" (1.664 m)   Wt 233 lb 4 oz (105.8 kg)   BMI 38.22 kg/m  , BMI Body mass index is 38.22 kg/m. GEN: Well nourished, well developed, in no acute distress  HEENT: normal  Neck: no JVD, carotid bruits, or masses Cardiac: RRR; no rubs, or gallops, +1 edema . 2/6 systolic ejection murmur in the aortic area. Respiratory:  clear to auscultation bilaterally, normal work of breathing GI: soft, nontender, nondistended, + BS MS: no deformity or atrophy  Skin: warm and dry, no rash Neuro:  Strength and sensation are intact Psych: euthymic mood, full affect   EKG:  EKG is not  ordered today.   Recent Labs: 06/02/2016: TSH 0.787 10/07/2016: Magnesium 1.9 01/28/2017: ALT 62 01/31/2017: Hemoglobin 12.2; Platelets 238 02/01/2017: BUN 9; Creatinine, Ser 0.81; Potassium 3.3; Sodium 137    Lipid Panel    Component Value Date/Time   CHOL 157 06/02/2016 1034   TRIG 79 06/02/2016 1034   HDL 61 06/02/2016 1034   CHOLHDL 2.6 06/02/2016 1034   LDLCALC 80 06/02/2016 1034      Wt Readings from Last 3 Encounters:  05/18/17 233 lb 4 oz (105.8 kg)  04/04/17 235 lb (106.6 kg)  03/16/17 235 lb 9.6 oz (106.9 kg)       No flowsheet data found.    ASSESSMENT AND PLAN:  1.  Exertional dyspnea, likely multifactorial due to mild diastolic heart failure and physical deconditioning.  Stress test showed no evidence of ischemia.   Echocardiogram showed mild LVOT gradient.    2. Cardiac murmur: Due to mild LVOT gradient.  No intervention is required at the present time.  Can consider treatment with small dose diltiazem.  3.  Essential hypertension: Interestingly, she was started on Midodrin during most recent hospitalization for orthostatic hypotension.  However, blood pressure is somewhat on the high side.  Repeat by me  was 140/70.  She is also on chlorthalidone.  Consider stopping Midodrin and monitoring her symptoms.  Switching chlorthalidone to a different antihypertensive medication might also improve her orthostatic dizziness.   Disposition:   FU with me as needed .   Signed,  Kathlyn Sacramento, MD  05/18/2017 12:26 PM    [place]

## 2017-05-18 NOTE — Patient Instructions (Signed)
Medication Instructions: Continue same medications.   Labwork: None.   Procedures/Testing: None.   Follow-Up: As needed with Dr. Vonn Sliger.   Any Additional Special Instructions Will Be Listed Below (If Applicable).     If you need a refill on your cardiac medications before your next appointment, please call your pharmacy.   

## 2017-05-19 DIAGNOSIS — K219 Gastro-esophageal reflux disease without esophagitis: Secondary | ICD-10-CM | POA: Diagnosis not present

## 2017-05-19 DIAGNOSIS — M10072 Idiopathic gout, left ankle and foot: Secondary | ICD-10-CM | POA: Diagnosis not present

## 2017-05-19 DIAGNOSIS — R1312 Dysphagia, oropharyngeal phase: Secondary | ICD-10-CM | POA: Diagnosis not present

## 2017-05-19 DIAGNOSIS — K314 Gastric diverticulum: Secondary | ICD-10-CM | POA: Diagnosis not present

## 2017-05-19 DIAGNOSIS — K8020 Calculus of gallbladder without cholecystitis without obstruction: Secondary | ICD-10-CM | POA: Diagnosis not present

## 2017-05-19 DIAGNOSIS — K5730 Diverticulosis of large intestine without perforation or abscess without bleeding: Secondary | ICD-10-CM | POA: Diagnosis not present

## 2017-05-19 DIAGNOSIS — Z9181 History of falling: Secondary | ICD-10-CM | POA: Diagnosis not present

## 2017-05-19 DIAGNOSIS — M6281 Muscle weakness (generalized): Secondary | ICD-10-CM | POA: Diagnosis not present

## 2017-05-19 DIAGNOSIS — G4733 Obstructive sleep apnea (adult) (pediatric): Secondary | ICD-10-CM | POA: Diagnosis not present

## 2017-05-19 DIAGNOSIS — I951 Orthostatic hypotension: Secondary | ICD-10-CM | POA: Diagnosis not present

## 2017-05-25 DIAGNOSIS — K314 Gastric diverticulum: Secondary | ICD-10-CM | POA: Diagnosis not present

## 2017-05-25 DIAGNOSIS — K219 Gastro-esophageal reflux disease without esophagitis: Secondary | ICD-10-CM | POA: Diagnosis not present

## 2017-05-25 DIAGNOSIS — G4733 Obstructive sleep apnea (adult) (pediatric): Secondary | ICD-10-CM | POA: Diagnosis not present

## 2017-05-25 DIAGNOSIS — Z9181 History of falling: Secondary | ICD-10-CM | POA: Diagnosis not present

## 2017-05-25 DIAGNOSIS — M6281 Muscle weakness (generalized): Secondary | ICD-10-CM | POA: Diagnosis not present

## 2017-05-25 DIAGNOSIS — M10072 Idiopathic gout, left ankle and foot: Secondary | ICD-10-CM | POA: Diagnosis not present

## 2017-05-25 DIAGNOSIS — I951 Orthostatic hypotension: Secondary | ICD-10-CM | POA: Diagnosis not present

## 2017-05-25 DIAGNOSIS — K8020 Calculus of gallbladder without cholecystitis without obstruction: Secondary | ICD-10-CM | POA: Diagnosis not present

## 2017-05-25 DIAGNOSIS — K5730 Diverticulosis of large intestine without perforation or abscess without bleeding: Secondary | ICD-10-CM | POA: Diagnosis not present

## 2017-05-25 DIAGNOSIS — R1312 Dysphagia, oropharyngeal phase: Secondary | ICD-10-CM | POA: Diagnosis not present

## 2017-05-26 DIAGNOSIS — K8020 Calculus of gallbladder without cholecystitis without obstruction: Secondary | ICD-10-CM | POA: Diagnosis not present

## 2017-05-26 DIAGNOSIS — K314 Gastric diverticulum: Secondary | ICD-10-CM | POA: Diagnosis not present

## 2017-05-26 DIAGNOSIS — R1312 Dysphagia, oropharyngeal phase: Secondary | ICD-10-CM | POA: Diagnosis not present

## 2017-05-26 DIAGNOSIS — M10072 Idiopathic gout, left ankle and foot: Secondary | ICD-10-CM | POA: Diagnosis not present

## 2017-05-26 DIAGNOSIS — K219 Gastro-esophageal reflux disease without esophagitis: Secondary | ICD-10-CM | POA: Diagnosis not present

## 2017-05-26 DIAGNOSIS — Z9181 History of falling: Secondary | ICD-10-CM | POA: Diagnosis not present

## 2017-05-26 DIAGNOSIS — K5730 Diverticulosis of large intestine without perforation or abscess without bleeding: Secondary | ICD-10-CM | POA: Diagnosis not present

## 2017-05-26 DIAGNOSIS — G4733 Obstructive sleep apnea (adult) (pediatric): Secondary | ICD-10-CM | POA: Diagnosis not present

## 2017-05-26 DIAGNOSIS — I951 Orthostatic hypotension: Secondary | ICD-10-CM | POA: Diagnosis not present

## 2017-05-26 DIAGNOSIS — M6281 Muscle weakness (generalized): Secondary | ICD-10-CM | POA: Diagnosis not present

## 2017-05-30 DIAGNOSIS — G4733 Obstructive sleep apnea (adult) (pediatric): Secondary | ICD-10-CM | POA: Diagnosis not present

## 2017-05-31 DIAGNOSIS — R1312 Dysphagia, oropharyngeal phase: Secondary | ICD-10-CM | POA: Diagnosis not present

## 2017-05-31 DIAGNOSIS — I951 Orthostatic hypotension: Secondary | ICD-10-CM | POA: Diagnosis not present

## 2017-05-31 DIAGNOSIS — Z9181 History of falling: Secondary | ICD-10-CM | POA: Diagnosis not present

## 2017-05-31 DIAGNOSIS — K314 Gastric diverticulum: Secondary | ICD-10-CM | POA: Diagnosis not present

## 2017-05-31 DIAGNOSIS — K8020 Calculus of gallbladder without cholecystitis without obstruction: Secondary | ICD-10-CM | POA: Diagnosis not present

## 2017-05-31 DIAGNOSIS — K5730 Diverticulosis of large intestine without perforation or abscess without bleeding: Secondary | ICD-10-CM | POA: Diagnosis not present

## 2017-05-31 DIAGNOSIS — M6281 Muscle weakness (generalized): Secondary | ICD-10-CM | POA: Diagnosis not present

## 2017-05-31 DIAGNOSIS — M10072 Idiopathic gout, left ankle and foot: Secondary | ICD-10-CM | POA: Diagnosis not present

## 2017-05-31 DIAGNOSIS — G4733 Obstructive sleep apnea (adult) (pediatric): Secondary | ICD-10-CM | POA: Diagnosis not present

## 2017-05-31 DIAGNOSIS — K219 Gastro-esophageal reflux disease without esophagitis: Secondary | ICD-10-CM | POA: Diagnosis not present

## 2017-06-02 DIAGNOSIS — K8020 Calculus of gallbladder without cholecystitis without obstruction: Secondary | ICD-10-CM | POA: Diagnosis not present

## 2017-06-02 DIAGNOSIS — I951 Orthostatic hypotension: Secondary | ICD-10-CM | POA: Diagnosis not present

## 2017-06-02 DIAGNOSIS — K314 Gastric diverticulum: Secondary | ICD-10-CM | POA: Diagnosis not present

## 2017-06-02 DIAGNOSIS — K219 Gastro-esophageal reflux disease without esophagitis: Secondary | ICD-10-CM | POA: Diagnosis not present

## 2017-06-02 DIAGNOSIS — M10072 Idiopathic gout, left ankle and foot: Secondary | ICD-10-CM | POA: Diagnosis not present

## 2017-06-02 DIAGNOSIS — M6281 Muscle weakness (generalized): Secondary | ICD-10-CM | POA: Diagnosis not present

## 2017-06-02 DIAGNOSIS — K5730 Diverticulosis of large intestine without perforation or abscess without bleeding: Secondary | ICD-10-CM | POA: Diagnosis not present

## 2017-06-02 DIAGNOSIS — G4733 Obstructive sleep apnea (adult) (pediatric): Secondary | ICD-10-CM | POA: Diagnosis not present

## 2017-06-02 DIAGNOSIS — R1312 Dysphagia, oropharyngeal phase: Secondary | ICD-10-CM | POA: Diagnosis not present

## 2017-06-02 DIAGNOSIS — Z9181 History of falling: Secondary | ICD-10-CM | POA: Diagnosis not present

## 2017-06-30 DIAGNOSIS — G4733 Obstructive sleep apnea (adult) (pediatric): Secondary | ICD-10-CM | POA: Diagnosis not present

## 2017-06-30 DIAGNOSIS — I10 Essential (primary) hypertension: Secondary | ICD-10-CM | POA: Diagnosis not present

## 2017-06-30 DIAGNOSIS — K219 Gastro-esophageal reflux disease without esophagitis: Secondary | ICD-10-CM | POA: Diagnosis not present

## 2017-06-30 DIAGNOSIS — J310 Chronic rhinitis: Secondary | ICD-10-CM | POA: Diagnosis not present

## 2017-07-31 DIAGNOSIS — G4733 Obstructive sleep apnea (adult) (pediatric): Secondary | ICD-10-CM | POA: Diagnosis not present

## 2017-08-31 DIAGNOSIS — G4733 Obstructive sleep apnea (adult) (pediatric): Secondary | ICD-10-CM | POA: Diagnosis not present

## 2017-09-28 DIAGNOSIS — G4733 Obstructive sleep apnea (adult) (pediatric): Secondary | ICD-10-CM | POA: Diagnosis not present

## 2017-10-29 DIAGNOSIS — G4733 Obstructive sleep apnea (adult) (pediatric): Secondary | ICD-10-CM | POA: Diagnosis not present

## 2017-11-28 DIAGNOSIS — G4733 Obstructive sleep apnea (adult) (pediatric): Secondary | ICD-10-CM | POA: Diagnosis not present

## 2017-12-29 DIAGNOSIS — G4733 Obstructive sleep apnea (adult) (pediatric): Secondary | ICD-10-CM | POA: Diagnosis not present

## 2018-01-17 DIAGNOSIS — G4733 Obstructive sleep apnea (adult) (pediatric): Secondary | ICD-10-CM | POA: Diagnosis not present

## 2018-02-07 ENCOUNTER — Emergency Department
Admission: EM | Admit: 2018-02-07 | Discharge: 2018-02-07 | Disposition: A | Discharge status: Home / Self Care | Payer: Medicare Other | Attending: Emergency Medicine | Admitting: Emergency Medicine

## 2018-02-07 ENCOUNTER — Emergency Department: Payer: Medicare Other

## 2018-02-07 ENCOUNTER — Encounter: Payer: Self-pay | Admitting: Radiology

## 2018-02-07 DIAGNOSIS — R112 Nausea with vomiting, unspecified: Secondary | ICD-10-CM | POA: Diagnosis not present

## 2018-02-07 DIAGNOSIS — R1013 Epigastric pain: Secondary | ICD-10-CM | POA: Diagnosis not present

## 2018-02-07 DIAGNOSIS — Z79899 Other long term (current) drug therapy: Secondary | ICD-10-CM | POA: Insufficient documentation

## 2018-02-07 DIAGNOSIS — K5730 Diverticulosis of large intestine without perforation or abscess without bleeding: Secondary | ICD-10-CM | POA: Diagnosis not present

## 2018-02-07 LAB — COMPREHENSIVE METABOLIC PANEL
ALT: 13 U/L (ref 0–44)
AST: 20 U/L (ref 15–41)
Albumin: 3.9 g/dL (ref 3.5–5.0)
Alkaline Phosphatase: 101 U/L (ref 38–126)
Anion gap: 8 (ref 5–15)
BUN: 17 mg/dL (ref 8–23)
CO2: 32 mmol/L (ref 22–32)
Calcium: 9.4 mg/dL (ref 8.9–10.3)
Chloride: 101 mmol/L (ref 98–111)
Creatinine, Ser: 0.91 mg/dL (ref 0.44–1.00)
GFR calc Af Amer: >60 mL/min (ref >60)
GFR calc non Af Amer: >60 mL/min (ref >60)
Glucose, Bld: 117 mg/dL — ABNORMAL HIGH (ref 70–99)
Potassium: 4 mmol/L (ref 3.5–5.1)
Sodium: 141 mmol/L (ref 135–145)
Total Bilirubin: 0.5 mg/dL (ref 0.3–1.2)
Total Protein: 7.8 g/dL (ref 6.5–8.1)

## 2018-02-07 LAB — TROPONIN I: Troponin I: <0.03 ng/mL (ref <0.03)

## 2018-02-07 LAB — CBC WITH DIFFERENTIAL/PLATELET
Basophils Absolute: 0 10*3/uL (ref 0–0.1)
Basophils Relative: 0 %
Eosinophils Absolute: 0.1 10*3/uL (ref 0–0.7)
Eosinophils Relative: 1 %
HCT: 39 % (ref 35.0–47.0)
Hemoglobin: 12.7 g/dL (ref 12.0–16.0)
Lymphocytes Relative: 7 %
Lymphs Abs: 0.7 10*3/uL — ABNORMAL LOW (ref 1.0–3.6)
MCH: 29 pg (ref 26.0–34.0)
MCHC: 32.6 g/dL (ref 32.0–36.0)
MCV: 88.9 fL (ref 80.0–100.0)
Monocytes Absolute: 0.3 10*3/uL (ref 0.2–0.9)
Monocytes Relative: 3 %
Neutro Abs: 8.6 10*3/uL — ABNORMAL HIGH (ref 1.4–6.5)
Neutrophils Relative %: 89 %
Platelets: 183 10*3/uL (ref 150–440)
RBC: 4.39 MIL/uL (ref 3.80–5.20)
RDW: 15.4 % — ABNORMAL HIGH (ref 11.5–14.5)
WBC: 9.7 10*3/uL (ref 3.6–11.0)

## 2018-02-07 LAB — LIPASE, BLOOD: Lipase: 24 U/L (ref 11–51)

## 2018-02-07 MED ORDER — ONDANSETRON HCL 4 MG/2ML IJ SOLN
4.0000 mg | Freq: Once | INTRAMUSCULAR | Status: AC
  Administered 2018-02-07: 4 mg via INTRAVENOUS
  Filled 2018-02-07: qty 2

## 2018-02-07 MED ORDER — IOPAMIDOL (ISOVUE-300) INJECTION 61%
100.0000 mL | Freq: Once | INTRAVENOUS | Status: AC | PRN
  Administered 2018-02-07: 100 mL via INTRAVENOUS

## 2018-02-07 MED ORDER — SODIUM CHLORIDE 0.9 % IV BOLUS
1000.0000 mL | Freq: Once | INTRAVENOUS | Status: AC
  Administered 2018-02-07: 1000 mL via INTRAVENOUS

## 2018-02-07 MED ORDER — ONDANSETRON 8 MG PO TBDP: 8.0000 mg | ORAL_TABLET | Freq: Three times a day (TID) | ORAL | 0 refills | Status: DC | PRN

## 2018-02-07 MED ORDER — ONDANSETRON 8 MG PO TBDP
8.0000 mg | ORAL_TABLET | Freq: Three times a day (TID) | ORAL | 0 refills | Status: AC | PRN
Start: 2018-02-07 — End: 2018-02-12

## 2018-02-07 MED ORDER — MORPHINE SULFATE (PF) 4 MG/ML IV SOLN
4.0000 mg | Freq: Once | INTRAVENOUS | Status: AC
Start: 2018-02-07 — End: 2018-02-07
  Administered 2018-02-07: 4 mg via INTRAVENOUS
  Filled 2018-02-07: qty 1

## 2018-02-07 MED ORDER — FAMOTIDINE 20 MG PO TABS: 20.0000 mg | ORAL_TABLET | Freq: Two times a day (BID) | ORAL | 0 refills | Status: DC

## 2018-02-07 NOTE — ED Triage Notes (Signed)
Pt arrived via ambulance c/o n/v abd pain that began approx. 1 hr ago Hx of diverticulitis

## 2018-02-07 NOTE — ED Notes (Signed)
Pt returned from ct to room at this time .

## 2018-02-07 NOTE — ED Provider Notes (Addendum)
[facility] Emergency Department Provider Note ____________________________________________   First MD Initiated Contact with Patient 02/07/18 1840     (approximate)  I have reviewed the triage vital signs and the nursing notes.   HISTORY  Chief Complaint Abdominal Pain    HPI Taylor Johnson is a 72 y.o. female with PMH as noted below who presents with abdominal pain, primarily in the upper abdomen, starting about 1 hour ago, and associated with nausea and vomiting.  The patient denies any diarrhea or change in her bowel movements, and states that her last bowel movement was yesterday.  She denies any urinary symptoms, or any fever.  She states that this pain is different than abdominal pain she has had before.  She states that occurred shortly after eating, but she did not eat anything unusual.  Past Medical History:  Diagnosis Date  . Anxiety   . Anxiety, generalized 05/11/2015  . Asthma   . Cough 06/02/2016   Chronic - followed by Pulmonary  . Depression   . Developmental delay   . Diverticulitis   . Diverticulosis of colon 05/11/2015  . Dysphagia 10/15/2015   Routine Ba Swallow normal; rec modified barium swallow   . Hypoxia 09/12/2015  . Leg swelling   . Localized edema 04/15/2016  . Microscopic hematuria 04/15/2016  . OSA on CPAP 11/18/2015   CPAP @ 18 cm H2O started 02/2016  . Urge incontinence of urine 05/12/2015    Patient Active Problem List   Diagnosis Date Noted  . Functional systolic murmur 44/31/5400  . PAT (paroxysmal atrial tachycardia) ([place]) 03/21/2017  . Orthostatic hypotension 03/16/2017  . Developmental delay disorder 02/06/2017  . Nausea and vomiting 01/29/2017  . Abdominal pain 01/27/2017  . Calculus of gallbladder without cholecystitis without obstruction 01/20/2017  . Cough 06/02/2016  . Localized edema 04/15/2016  . Microscopic hematuria 04/15/2016  . OSA on CPAP 11/18/2015  . Dysphagia 10/15/2015  . Hypoxia 09/12/2015    . Urge incontinence of urine 05/12/2015  . Diverticulosis of colon 05/11/2015  . Anxiety, generalized 05/11/2015    Past Surgical History:  Procedure Laterality Date  . ABDOMINAL HYSTERECTOMY    . BREAST BIOPSY Left    neg  . COLON SURGERY    . ESOPHAGOGASTRODUODENOSCOPY (EGD) WITH PROPOFOL N/A 01/29/2017   Procedure: ESOPHAGOGASTRODUODENOSCOPY (EGD) WITH PROPOFOL;  Surgeon: Wilford Corner, MD;  Location[facility] ENDOSCOPY;  Service: Endoscopy;  Laterality: N/A;    Prior to Admission medications   Medication Sig Start Date End Date Taking? Authorizing Provider  chlorthalidone (HYGROTON) 25 MG tablet Take 25 mg by mouth daily.    [provider]  colchicine 0.6 MG tablet Take 0.6 mg by mouth daily.    [provider]  docusate sodium (COLACE) 100 MG capsule Take 100 mg by mouth 2 (two) times daily.    [provider]  esomeprazole (NEXIUM) 40 MG capsule Take 1 capsule (40 mg total) by mouth daily at 12 noon. 03/16/17   [name], MD  HYDROcodone-acetaminophen (NORCO) 5-325 MG tablet Take 1 tablet by mouth every 6 (six) hours as needed for moderate pain. 01/31/17   Dustin Flock, MD  hyoscyamine (LEVSIN) 0.125 MG/ML solution Take 0.125 mg by mouth every 4 (four) hours as needed.    [provider]  metroNIDAZOLE (FLAGYL) 500 MG tablet Take 1 tablet (500 mg total) by mouth 2 (two) times daily. 03/15/17   Lin Landsman, MD  midodrine (PROAMATINE) 5 MG tablet Take 1 tablet (5  mg total) by mouth 2 (two) times daily with a meal. 03/16/17   [name], MD  ondansetron (ZOFRAN) 4 MG tablet Take 1 tablet (4 mg total) by mouth every 6 (six) hours as needed for nausea. 01/31/17   Dustin Flock, MD  potassium chloride SA (K-DUR,KLOR-CON) 20 MEQ tablet Take 20 mEq by mouth 2 (two) times daily.    [provider]    Allergies Patient has no known allergies.  Family History  Problem Relation Age of Onset  . Hypertension Mother   .  Kidney disease Mother   . Bladder Cancer Neg Hx   . Breast cancer Neg Hx     Social History Social History   Tobacco Use  . Smoking status: Never Smoker  . Smokeless tobacco: Never Used  Substance Use Topics  . Alcohol use: No    Alcohol/week: 0.0 oz  . Drug use: No    Review of Systems  Constitutional: No fever. Eyes: No redness. ENT: No sore throat. Cardiovascular: Denies chest pain. Respiratory: Denies shortness of breath. Gastrointestinal: Positive for vomiting and abdominal pain. Genitourinary: Negative for flank pain.  Musculoskeletal: Negative for back pain. Skin: Negative for rash. Neurological: Negative for headache.   ____________________________________________   PHYSICAL EXAM:  VITAL SIGNS: ED Triage Vitals [02/07/18 1850]  Enc Vitals Group     BP (!) 175/135     Pulse Rate 80     Resp 20     Temp      Temp src      SpO2 94 %     Weight      Height      Head Circumference      Peak Flow      Pain Score      Pain Loc      Pain Edu?      Excl. in [place]?     Constitutional: Alert and oriented.  Slightly uncomfortable appearing but in no acute distress. Eyes: Conjunctivae are normal.  No scleral icterus. Head: Atraumatic. Nose: No congestion/rhinnorhea. Mouth/Throat: Mucous membranes are moist.   Neck: Normal range of motion.  Cardiovascular: Normal rate, regular rhythm.  Good peripheral circulation. Respiratory: Normal respiratory effort.  No retractions.  Gastrointestinal: Soft with moderate epigastric tenderness and mild right mid abdominal tenderness. No distention.  Genitourinary: No flank tenderness. Musculoskeletal: Extremities warm and well perfused.  Neurologic:  Normal speech and language. No gross focal neurologic deficits are appreciated.  Skin:  Skin is warm and dry. No rash noted. Psychiatric: Mood and affect are normal. Speech and behavior are normal.  ____________________________________________   LABS (all labs ordered are  listed, but only abnormal results are displayed)  Labs Reviewed  COMPREHENSIVE METABOLIC PANEL - Abnormal; Notable for the following components:      Result Value   Glucose, Bld 117 (*)    All other components within normal limits  CBC WITH DIFFERENTIAL/PLATELET - Abnormal; Notable for the following components:   RDW 15.4 (*)    Neutro Abs 8.6 (*)    Lymphs Abs 0.7 (*)    All other components within normal limits  TROPONIN I  LIPASE, BLOOD  URINALYSIS, COMPLETE (UACMP) WITH MICROSCOPIC   ____________________________________________  EKG  ED ECG REPORT I, Arta Silence, the attending physician, personally viewed and interpreted this ECG.  Date: 02/07/2018 EKG Time: 1843 Rate: 99 Rhythm: normal sinus rhythm QRS Axis: normal Intervals: normal ST/T Wave abnormalities: normal Narrative Interpretation: no evidence of acute ischemia  ____________________________________________  RADIOLOGY  CT abdomen: No acute intra-abdominal findings  ____________________________________________   PROCEDURES  Procedure(s) performed: No  Procedures  Critical Care performed: No ____________________________________________   INITIAL IMPRESSION / ASSESSMENT AND PLAN / ED COURSE  Pertinent labs & imaging results that were available during my care of the patient were reviewed by me and considered in my medical decision making (see chart for details).  72 year old female with PMH as noted above including develop mental delay presents with primarily upper and mid abdominal pain, with nausea and vomiting starting over the last hour.  Review of systems is otherwise negative.  I reviewed the past medical records in epic; the patient was most recent admitted to the hospital approximately 1 year ago with abdominal pain with no clear etiology at that time.  She had a history of diverticulosis but no diverticulitis at that time.  On exam, the patient is slightly uncomfortable but not acutely  ill-appearing.  She is hypertensive but her other vital signs are normal.  The abdomen is soft with some epigastric and right mid abdominal tenderness.  Overall I suspect most likely benign cause such as gastritis, early gastroenteritis, however I am also considering pancreatitis or other hepatobiliary cause, diverticulitis, colitis, or less likely SBO.  We will obtain labs, give fluids and symptomatic treatment, and obtain a CT.  ----------------------------------------- 10:27 PM on 02/07/2018 -----------------------------------------  The patient's lab work-up is unremarkable.  Her vital signs have remained stable.  Her symptoms have completely resolved after the initial nausea and pain medication.  CT again shows diverticulosis with no evidence of diverticulitis, and no acute abnormalities.  The patient has not submitted a urine for urinalysis, however she denies any urinary symptoms, has no lower abdominal pain, no elevated WBC count, no fever, so therefore the I have no clinical concern for UTI as a cause of patient's symptoms.  There is no indication for UA at this time.  Given the negative CT, overall I again suspect gastritis or early gastroenteritis as the most likely causes of the patient's presentation.  The patient feels well to go home.  She is stable for discharge at this time.  Return precautions given, and the patient and her brother expressed understanding.   ____________________________________________   FINAL CLINICAL IMPRESSION(S) / ED DIAGNOSES  Final diagnoses:  Epigastric pain  Non-intractable vomiting with nausea, unspecified vomiting type      NEW MEDICATIONS STARTED DURING THIS VISIT:  New Prescriptions   No medications on file     Note:  This document was prepared using Dragon voice recognition software and may include unintentional dictation errors.      Arta Silence, MD 02/07/18 2233

## 2018-02-07 NOTE — Discharge Instructions (Addendum)
Return to the ER for new, worsening, persistent severe pain, vomiting, fevers, weakness, or any other new or worsening symptoms that concern you.  Follow-up with your regular doctor in 1 to 2 weeks.

## 2018-02-12 ENCOUNTER — Telehealth: Payer: Self-pay | Admitting: Internal Medicine

## 2018-02-12 NOTE — Telephone Encounter (Signed)
Called to schedule Medicare Annual Wellness Visit with Nurse Health Advisor. If patient returns call, please schedule AWV with NHA any date  °Thank you! °For any questions please contact: °Kathryn Brown [phone]  °Or Skype me at: [email or web address]  ° ° °

## 2018-03-05 ENCOUNTER — Ambulatory Visit (INDEPENDENT_AMBULATORY_CARE_PROVIDER_SITE_OTHER): Payer: Medicare Other | Admitting: Internal Medicine

## 2018-03-05 ENCOUNTER — Encounter: Payer: Self-pay | Admitting: Internal Medicine

## 2018-03-05 ENCOUNTER — Ambulatory Visit (INDEPENDENT_AMBULATORY_CARE_PROVIDER_SITE_OTHER): Payer: Medicare Other

## 2018-03-05 VITALS — BP 118/78 | HR 70 | Temp 98.2°F | Resp 12 | Ht 66.0 in | Wt 248.0 lb

## 2018-03-05 VITALS — BP 118/78 | HR 70 | Ht 65.0 in | Wt 248.0 lb

## 2018-03-05 DIAGNOSIS — Z135 Encounter for screening for eye and ear disorders: Secondary | ICD-10-CM

## 2018-03-05 DIAGNOSIS — Z0000 Encounter for general adult medical examination without abnormal findings: Secondary | ICD-10-CM

## 2018-03-05 DIAGNOSIS — E2839 Other primary ovarian failure: Secondary | ICD-10-CM

## 2018-03-05 DIAGNOSIS — H539 Unspecified visual disturbance: Secondary | ICD-10-CM

## 2018-03-05 DIAGNOSIS — K2970 Gastritis, unspecified, without bleeding: Secondary | ICD-10-CM | POA: Diagnosis not present

## 2018-03-05 DIAGNOSIS — Z1231 Encounter for screening mammogram for malignant neoplasm of breast: Secondary | ICD-10-CM | POA: Diagnosis not present

## 2018-03-05 DIAGNOSIS — Z1239 Encounter for other screening for malignant neoplasm of breast: Secondary | ICD-10-CM

## 2018-03-05 MED ORDER — FAMOTIDINE 20 MG PO TABS: 20.0000 mg | ORAL_TABLET | Freq: Two times a day (BID) | ORAL | 5 refills | Status: DC

## 2018-03-05 NOTE — Progress Notes (Signed)
Subjective:   Taylor Johnson is a 72 y.o. female who presents for Medicare Annual (Subsequent) preventive examination.  Review of Systems:  N/A Cardiac Risk Factors include: advanced age (>45men, >55 women);obesity (BMI >30kg/m2);sedentary lifestyle;Other (see comment), Risk factor comments: OSA with CPAP, orthostatic hypotension     Objective:     Vitals: BP 118/78 (BP Location: Left Arm, Patient Position: Sitting, Cuff Size: Large)   Pulse 70   Temp 98.2 F (36.8 C) (Oral)   Resp 12   Ht 5\' 6"  (1.676 m)   Wt 248 lb (112.5 kg)   SpO2 97%   BMI 40.03 kg/m   Body mass index is 40.03 kg/m.  Advanced Directives 03/05/2018 01/27/2017 01/12/2017 12/11/2016 10/07/2016 06/02/2016 02/08/2016  Does Patient Have a Medical Advance Directive? No No No No No No No  Would patient like information on creating a medical advance directive? Yes (MAU/Ambulatory/Procedural Areas - Information given) No - Patient declined - No - Patient declined No - Patient declined - No - patient declined information    Tobacco Social History   Tobacco Use  Smoking Status Never Smoker  Smokeless Tobacco Never Used  Tobacco Comment   smoking cessation materials not required     Counseling given: No Comment: smoking cessation materials not required  Clinical Intake:  Pre-visit preparation completed: Yes  Pain : No/denies pain   BMI - recorded: 40.03 Nutritional Status: BMI > 30  Obese Nutritional Risks: None Diabetes: No  How often do you need to have someone help you when you read instructions, pamphlets, or other written materials from your doctor or pharmacy?: 1 - Never  Interpreter Needed?: No  Information entered by :: AEversole, LPN  Past Medical History:  Diagnosis Date  . Anxiety   . Anxiety, generalized 05/11/2015  . Asthma   . Cough 06/02/2016   Chronic - followed by Pulmonary  . Depression   . Developmental delay   . Diverticulitis   . Diverticulosis of colon 05/11/2015  .  Dysphagia 10/15/2015   Routine Ba Swallow normal; rec modified barium swallow   . Hypoxia 09/12/2015  . Leg swelling   . Localized edema 04/15/2016  . Microscopic hematuria 04/15/2016  . OSA on CPAP 11/18/2015   CPAP @ 18 cm H2O started 02/2016  . Urge incontinence of urine 05/12/2015   Past Surgical History:  Procedure Laterality Date  . ABDOMINAL HYSTERECTOMY    . BREAST BIOPSY Left    neg  . COLON SURGERY    . ESOPHAGOGASTRODUODENOSCOPY (EGD) WITH PROPOFOL N/A 01/29/2017   Procedure: ESOPHAGOGASTRODUODENOSCOPY (EGD) WITH PROPOFOL;  Surgeon: Wilford Corner, MD;  Location[facility] ENDOSCOPY;  Service: Endoscopy;  Laterality: N/A;   Family History  Problem Relation Age of Onset  . Hypertension Mother   . Kidney disease Mother   . Dementia Mother   . Cancer Father        lung  . Bladder Cancer Neg Hx   . Breast cancer Neg Hx    Social History   Socioeconomic History  . Marital status: Single    Spouse name: Not on file  . Number of children: 0  . Years of education: Not on file  . Highest education level: 5th grade  Occupational History    Employer: retired  Scientific laboratory technician  . Financial resource strain: Not hard at all  . Food insecurity:    Worry: Never true    Inability: Never true  . Transportation needs:    Medical: No  Non-medical: No  Tobacco Use  . Smoking status: Never Smoker  . Smokeless tobacco: Never Used  . Tobacco comment: smoking cessation materials not required  Substance and Sexual Activity  . Alcohol use: No    Alcohol/week: 0.0 standard drinks  . Drug use: No  . Sexual activity: Never    Birth control/protection: Post-menopausal  Lifestyle  . Physical activity:    Days per week: 0 days    Minutes per session: 0 min  . Stress: To some extent  Relationships  . Social connections:    Talks on phone: Not on file    Gets together: Not on file    Attends religious service: Not on file    Active member of club or organization: Not on file     Attends meetings of clubs or organizations: Not on file    Relationship status: Not on file  Other Topics Concern  . Not on file  Social History Narrative  . Not on file    Outpatient Encounter Medications as of 03/05/2018  Medication Sig  . famotidine (PEPCID) 20 MG tablet Take 1 tablet (20 mg total) by mouth 2 (two) times daily.  . ondansetron (ZOFRAN) 4 MG tablet Take 1 tablet (4 mg total) by mouth every 6 (six) hours as needed for nausea.   No facility-administered encounter medications on file as of 03/05/2018.     Activities of Daily Living In your present state of health, do you have any difficulty performing the following activities: 03/05/2018 03/05/2018  Hearing? N N  Comment denies hearing aids -  Vision? N N  Comment denies eyeglasses -  Difficulty concentrating or making decisions? N N  Walking or climbing stairs? N N  Dressing or bathing? N N  Doing errands, shopping? Y N  Comment sister transports -  Conservation officer, nature and eating ? N -  Comment denies dentures -  Using the Toilet? N -  In the past six months, have you accidently leaked urine? Y -  Comment urgency -  Do you have problems with loss of bowel control? N -  Managing your Medications? N -  Managing your Finances? Y -  Comment sister manages -  Housekeeping or managing your Housekeeping? N -  Some recent data might be hidden    Patient Care Team: [name], MD as PCP - General (Family Medicine) Erby Pian, MD as Consulting Physician (Pulmonary Disease)    Assessment:   This is a routine wellness examination for [facility].  Exercise Activities and Dietary recommendations Current Exercise Habits: The patient does not participate in regular exercise at present, Exercise limited by: None identified  Goals    . Prevent falls     Recommend to remove any items from the home that may cause slips or trips.       Fall Risk Fall Risk  03/05/2018 03/05/2018 03/05/2018 06/02/2016 05/12/2015  Falls  in the past year? No No No No No  Risk for fall due to : Impaired balance/gait - - - -   FALL RISK PREVENTION PERTAINING TO HOME: Is your home free of loose throw rugs in walkways, pet beds, electrical cords, etc? Yes Is there adequate lighting in your home to reduce risk of falls?  Yes Are there stairs in or around your home WITH handrails? Yes  ASSISTIVE DEVICES UTILIZED TO PREVENT FALLS: Use of a cane, walker or w/c? Yes, cane Grab bars in the bathroom? Yes  Shower chair or a place to sit  while bathing? Yes An elevated toilet seat or a handicapped toilet? Yes  Timed Get Up and Go Performed: Yes. Pt ambulated 10 feet within 29 sec. Gait slow, steady and without the use of an assistive device. No intervention required at this time. Fall risk prevention has been discussed.  Community Resource Referral:  [place] Referral not required at this time.   Depression Screen PHQ 2/9 Scores 03/05/2018 06/02/2016 05/12/2015  PHQ - 2 Score 2 0 2  PHQ- 9 Score 6 - 4     Cognitive Function     6CIT Screen 03/05/2018 06/02/2016  What Year? 4 points 4 points  What month? 0 points 0 points  What time? 0 points 0 points  Count back from 20 2 points 4 points  Months in reverse 4 points 4 points  Repeat phrase 6 points 4 points  Total Score 16 16    Immunization History  Administered Date(s) Administered  . Influenza,inj,Quad PF,6+ Mos 05/12/2015, 04/05/2016, 03/16/2017  . Pneumococcal Conjugate-13 04/05/2016  . Pneumococcal Polysaccharide-23 01/06/2014, 09/13/2015    Qualifies for Shingles Vaccine? Yes. Due for Shingrix. Education has been provided regarding the importance of this vaccine. Pt has been advised to call insurance company to determine out of pocket expense. Advised may also receive vaccine at local pharmacy or Health Dept. Verbalized acceptance and understanding.  Due for Tdap vaccine. Education has been provided regarding the importance of this vaccine. Advised may  receive this vaccine at local pharmacy or Health Dept. Aware to provide a copy of the vaccination record if obtained from local pharmacy or Health Dept. Verbalized acceptance and understanding.  Screening Tests Health Maintenance  Topic Date Due  . DEXA SCAN  12/03/2011  . INFLUENZA VACCINE  02/15/2018  . TETANUS/TDAP  03/06/2019 (Originally 12/02/1964)  . MAMMOGRAM  05/01/2018  . COLONOSCOPY  04/21/2024  . Hepatitis C Screening  Completed  . PNA vac Low Risk Adult  Completed    Cancer Screenings: Lung: Low Dose CT Chest recommended if Age 28-80 years, 30 pack-year currently smoking OR have quit w/in 15years. Patient does not qualify. Breast:  Up to date on Mammogram? Yes. Completed 05/01/17. Ordered today   Up to date of Bone Density/Dexa? No. Ordered today Colorectal: Completed 04/21/14. Repeat every 10 years  Additional Screenings: Hepatitis C Screening: Completed 06/02/16    Plan:  I have personally reviewed and addressed the Medicare Annual Wellness questionnaire and have noted the following in the patient's chart:  A. Medical and social history B. Use of alcohol, tobacco or illicit drugs  C. Current medications and supplements D. Functional ability and status E.  Nutritional status F.  Physical activity G. Advance directives H. List of other physicians I.  Hospitalizations, surgeries, and ER visits in previous 12 months J.  [place] such as hearing and vision if needed, cognitive and depression L. Referrals and appointments  In addition, I have reviewed and discussed with patient certain preventive protocols, quality metrics, and best practice recommendations. A written personalized care plan for preventive services as well as general preventive health recommendations were provided to patient.  Signed,  Aleatha Borer, LPN Nurse Health Advisor  MD Recommendations: Due for Shingrix. Education has been provided regarding the importance of this vaccine. Pt has  been advised to call insurance company to determine out of pocket expense. Advised may also receive vaccine at local pharmacy or Health Dept. Verbalized acceptance and understanding.  Due for Tdap vaccine. Education has been provided regarding the importance of  this vaccine. Advised may receive this vaccine at local pharmacy or Health Dept. Aware to provide a copy of the vaccination record if obtained from local pharmacy or Health Dept. Verbalized acceptance and understanding.  Bone Density/Dexa: Ordered today  Mammogram: Completed 05/01/17. Ordered today

## 2018-03-05 NOTE — Progress Notes (Signed)
Date:  03/05/2018   Name:  Taylor Johnson   DOB:  03-30-1946   MRN:  [id number]   Chief Complaint: gastritis Gastroesophageal Reflux  She complains of abdominal pain and heartburn. She reports no chest pain or no nausea. This is a recurrent problem. The problem occurs frequently. The problem has been unchanged. Pertinent negatives include no fatigue. She has tried a histamine-2 antagonist for the symptoms.  She was seen in the ED 3 weeks ago.  CT was negative.  She was given pepcid and zofran.   She is feeling much better, no further vomiting.  Appetite is good - has gained 11 lbs since discharge from [facility].   Review of Systems  Constitutional: Positive for unexpected weight change. Negative for chills, fatigue and fever.  Respiratory: Negative for shortness of breath.   Cardiovascular: Negative for chest pain and palpitations.  Gastrointestinal: Positive for abdominal pain and heartburn. Negative for blood in stool, constipation, diarrhea and nausea.  Neurological: Negative for dizziness and headaches.  Psychiatric/Behavioral: Negative for sleep disturbance. The patient is not nervous/anxious.     Patient Active Problem List   Diagnosis Date Noted  . Functional systolic murmur 32/35/5732  . PAT (paroxysmal atrial tachycardia) ([facility]) 03/21/2017  . Orthostatic hypotension 03/16/2017  . Developmental delay disorder 02/06/2017  . Nausea and vomiting 01/29/2017  . Abdominal pain 01/27/2017  . Calculus of gallbladder without cholecystitis without obstruction 01/20/2017  . Cough 06/02/2016  . Localized edema 04/15/2016  . Microscopic hematuria 04/15/2016  . OSA on CPAP 11/18/2015  . Dysphagia 10/15/2015  . Hypoxia 09/12/2015  . Urge incontinence of urine 05/12/2015  . Diverticulosis of colon 05/11/2015  . Anxiety, generalized 05/11/2015    No Known Allergies  Past Surgical History:  Procedure Laterality Date  . ABDOMINAL HYSTERECTOMY    . BREAST BIOPSY Left    neg  . COLON  SURGERY    . ESOPHAGOGASTRODUODENOSCOPY (EGD) WITH PROPOFOL N/A 01/29/2017   Procedure: ESOPHAGOGASTRODUODENOSCOPY (EGD) WITH PROPOFOL;  Surgeon: Wilford Corner, MD;  Location: [facility] ENDOSCOPY;  Service: Endoscopy;  Laterality: N/A;    Social History   Tobacco Use  . Smoking status: Never Smoker  . Smokeless tobacco: Never Used  Substance Use Topics  . Alcohol use: No    Alcohol/week: 0.0 standard drinks  . Drug use: No     Medication list has been reviewed and updated.  Current Meds  Medication Sig  . famotidine (PEPCID) 20 MG tablet Take 20 mg by mouth 2 (two) times daily.  . ondansetron (ZOFRAN) 4 MG tablet Take 1 tablet (4 mg total) by mouth every 6 (six) hours as needed for nausea.  . [DISCONTINUED] famotidine (PEPCID) 20 MG tablet Take 1 tablet (20 mg total) by mouth 2 (two) times daily for 15 days.    PHQ 2/9 Scores 06/02/2016 05/12/2015  PHQ - 2 Score 0 2  PHQ- 9 Score - 4    Physical Exam  Constitutional: She is oriented to person, place, and time. She appears well-developed. No distress.  HENT:  Head: Normocephalic and atraumatic.  Neck: Normal range of motion. Neck supple.  Cardiovascular: Normal rate, regular rhythm and normal heart sounds.  Pulmonary/Chest: Effort normal and breath sounds normal. No respiratory distress.  Abdominal: Soft. Bowel sounds are normal. She exhibits no distension and no mass. There is no tenderness. There is no rebound and no guarding.  Musculoskeletal: Normal range of motion.  Lymphadenopathy:    She has no cervical adenopathy.  Neurological: She is  alert and oriented to person, place, and time.  Skin: Skin is warm and dry. No rash noted.  Psychiatric: She has a normal mood and affect. Her speech is normal and behavior is normal.  Nursing note and vitals reviewed.   BP 118/78 (BP Location: Left Arm, Patient Position: Sitting, Cuff Size: Large)   Pulse 70   Ht 5\' 5"  (1.651 m)   Wt 248 lb (112.5 kg)   SpO2 97%   BMI 41.27  kg/m   Assessment and Plan: 1. Gastritis without bleeding, unspecified chronicity, unspecified gastritis type Continue bid Pepcid Avoid fatty, heavy foods Avoid further weight gain - famotidine (PEPCID) 20 MG tablet; Take 1 tablet (20 mg total) by mouth 2 (two) times daily.  Dispense: 60 tablet; Refill: 5   Meds ordered this encounter  Medications  . famotidine (PEPCID) 20 MG tablet    Sig: Take 1 tablet (20 mg total) by mouth 2 (two) times daily.    Dispense:  60 tablet    Refill:  5    Partially dictated using Editor, commissioning. Any errors are unintentional.  Halina Maidens, MD [facility]  03/05/2018

## 2018-03-05 NOTE — Patient Instructions (Signed)
Taylor Johnson , Thank you for taking time to come for your Medicare Wellness Visit. I appreciate your ongoing commitment to your health goals. Please review the following plan we discussed and let me know if I can assist you in the future.   Screening recommendations/referrals: Colorectal Screening: Up to date Mammogram: Up to date Bone Density: Up to date  Vision and Dental Exams: Recommended annual ophthalmology exams for early detection of glaucoma and other disorders of the eye Recommended annual dental exams for proper oral hygiene  Vaccinations: Influenza vaccine: Up to date Pneumococcal vaccine: Up to date Tdap vaccine: Declined. Please call your insurance company to determine your out of pocket expense. You may also receive this vaccine at your local pharmacy or Health Dept. Shingles vaccine: Please call your insurance company to determine your out of pocket expense for the Shingrix vaccine. You may also receive this vaccine at your local pharmacy or Health Dept.    Advanced directives: Advance directive discussed with you today. I have provided a copy for you to complete at home and have notarized. Once this is complete please bring a copy in to our office so we can scan it into your chart.  Goals: Recommend to remove any items from the home that may cause slips or trips.  Next appointment: Please schedule your Annual Wellness Visit with your Nurse Health Advisor in one year.  Preventive Care 6 Years and Older, Female Preventive care refers to lifestyle choices and visits with your health care provider that can promote health and wellness. What does preventive care include?  A yearly physical exam. This is also called an annual well check.  Dental exams once or twice a year.  Routine eye exams. Ask your health care provider how often you should have your eyes checked.  Personal lifestyle choices, including:  Daily care of your teeth and gums.  Regular physical  activity.  Eating a healthy diet.  Avoiding tobacco and drug use.  Limiting alcohol use.  Practicing safe sex.  Taking low-dose aspirin every day.  Taking vitamin and mineral supplements as recommended by your health care provider. What happens during an annual well check? The services and screenings done by your health care provider during your annual well check will depend on your age, overall health, lifestyle risk factors, and family history of disease. Counseling  Your health care provider may ask you questions about your:  Alcohol use.  Tobacco use.  Drug use.  Emotional well-being.  Home and relationship well-being.  Sexual activity.  Eating habits.  History of falls.  Memory and ability to understand (cognition).  Work and work Statistician.  Reproductive health. Screening  You may have the following tests or measurements:  Height, weight, and BMI.  Blood pressure.  Lipid and cholesterol levels. These may be checked every 5 years, or more frequently if you are over 33 years old.  Skin check.  Lung cancer screening. You may have this screening every year starting at age 30 if you have a 30-pack-year history of smoking and currently smoke or have quit within the past 15 years.  Fecal occult blood test (FOBT) of the stool. You may have this test every year starting at age 28.  Flexible sigmoidoscopy or colonoscopy. You may have a sigmoidoscopy every 5 years or a colonoscopy every 10 years starting at age 58.  Hepatitis C blood test.  Hepatitis B blood test.  Sexually transmitted disease (STD) testing.  Diabetes screening. This is done by checking your  blood sugar (glucose) after you have not eaten for a while (fasting). You may have this done every 1-3 years.  Bone density scan. This is done to screen for osteoporosis. You may have this done starting at age 70.  Mammogram. This may be done every 1-2 years. Talk to your health care provider about  how often you should have regular mammograms. Talk with your health care provider about your test results, treatment options, and if necessary, the need for more tests. Vaccines  Your health care provider may recommend certain vaccines, such as:  Influenza vaccine. This is recommended every year.  Tetanus, diphtheria, and acellular pertussis (Tdap, Td) vaccine. You may need a Td booster every 10 years.  Zoster vaccine. You may need this after age 57.  Pneumococcal 13-valent conjugate (PCV13) vaccine. One dose is recommended after age 50.  Pneumococcal polysaccharide (PPSV23) vaccine. One dose is recommended after age 100. Talk to your health care provider about which screenings and vaccines you need and how often you need them. This information is not intended to replace advice given to you by your health care provider. Make sure you discuss any questions you have with your health care provider. Document Released: 07/31/2015 Document Revised: 03/23/2016 Document Reviewed: 05/05/2015 Elsevier Interactive Patient Education  2017 [place] Prevention in the Home Falls can cause injuries. They can happen to people of all ages. There are many things you can do to make your home safe and to help prevent falls. What can I do on the outside of my home?  Regularly fix the edges of walkways and driveways and fix any cracks.  Remove anything that might make you trip as you walk through a door, such as a raised step or threshold.  Trim any bushes or trees on the path to your home.  Use bright outdoor lighting.  Clear any walking paths of anything that might make someone trip, such as rocks or tools.  Regularly check to see if handrails are loose or broken. Make sure that both sides of any steps have handrails.  Any raised decks and porches should have guardrails on the edges.  Have any leaves, snow, or ice cleared regularly.  Use sand or salt on walking paths during  winter.  Clean up any spills in your garage right away. This includes oil or grease spills. What can I do in the bathroom?  Use night lights.  Install grab bars by the toilet and in the tub and shower. Do not use towel bars as grab bars.  Use non-skid mats or decals in the tub or shower.  If you need to sit down in the shower, use a plastic, non-slip stool.  Keep the floor dry. Clean up any water that spills on the floor as soon as it happens.  Remove soap buildup in the tub or shower regularly.  Attach bath mats securely with double-sided non-slip rug tape.  Do not have throw rugs and other things on the floor that can make you trip. What can I do in the bedroom?  Use night lights.  Make sure that you have a light by your bed that is easy to reach.  Do not use any sheets or blankets that are too big for your bed. They should not hang down onto the floor.  Have a firm chair that has side arms. You can use this for support while you get dressed.  Do not have throw rugs and other things on the floor that  can make you trip. What can I do in the kitchen?  Clean up any spills right away.  Avoid walking on wet floors.  Keep items that you use a lot in easy-to-reach places.  If you need to reach something above you, use a strong step stool that has a grab bar.  Keep electrical cords out of the way.  Do not use floor polish or wax that makes floors slippery. If you must use wax, use non-skid floor wax.  Do not have throw rugs and other things on the floor that can make you trip. What can I do with my stairs?  Do not leave any items on the stairs.  Make sure that there are handrails on both sides of the stairs and use them. Fix handrails that are broken or loose. Make sure that handrails are as long as the stairways.  Check any carpeting to make sure that it is firmly attached to the stairs. Fix any carpet that is loose or worn.  Avoid having throw rugs at the top or  bottom of the stairs. If you do have throw rugs, attach them to the floor with carpet tape.  Make sure that you have a light switch at the top of the stairs and the bottom of the stairs. If you do not have them, ask someone to add them for you. What else can I do to help prevent falls?  Wear shoes that:  Do not have high heels.  Have rubber bottoms.  Are comfortable and fit you well.  Are closed at the toe. Do not wear sandals.  If you use a stepladder:  Make sure that it is fully opened. Do not climb a closed stepladder.  Make sure that both sides of the stepladder are locked into place.  Ask someone to hold it for you, if possible.  Clearly mark and make sure that you can see:  Any grab bars or handrails.  First and last steps.  Where the edge of each step is.  Use tools that help you move around (mobility aids) if they are needed. These include:  Canes.  Walkers.  Scooters.  Crutches.  Turn on the lights when you go into a dark area. Replace any light bulbs as soon as they burn out.  Set up your furniture so you have a clear path. Avoid moving your furniture around.  If any of your floors are uneven, fix them.  If there are any pets around you, be aware of where they are.  Review your medicines with your doctor. Some medicines can make you feel dizzy. This can increase your chance of falling. Ask your doctor what other things that you can do to help prevent falls. This information is not intended to replace advice given to you by your health care provider. Make sure you discuss any questions you have with your health care provider. Document Released: 04/30/2009 Document Revised: 12/10/2015 Document Reviewed: 08/08/2014 Elsevier Interactive Patient Education  2017 [employer].

## 2018-03-20 DIAGNOSIS — G4733 Obstructive sleep apnea (adult) (pediatric): Secondary | ICD-10-CM | POA: Diagnosis not present

## 2018-04-13 ENCOUNTER — Ambulatory Visit (INDEPENDENT_AMBULATORY_CARE_PROVIDER_SITE_OTHER): Payer: Medicare Other

## 2018-04-13 ENCOUNTER — Telehealth: Payer: Self-pay | Admitting: Internal Medicine

## 2018-04-13 DIAGNOSIS — Z23 Encounter for immunization: Secondary | ICD-10-CM | POA: Diagnosis not present

## 2018-04-13 DIAGNOSIS — H2513 Age-related nuclear cataract, bilateral: Secondary | ICD-10-CM | POA: Diagnosis not present

## 2018-04-13 NOTE — Telephone Encounter (Signed)
Patient has mammo and bone density scheduled 05/07/2018 at 11 AM  Thank you.

## 2018-04-13 NOTE — Telephone Encounter (Signed)
Patient said that she was due for a mammogram.

## 2018-05-07 ENCOUNTER — Ambulatory Visit
Admission: RE | Admit: 2018-05-07 | Discharge: 2018-05-07 | Disposition: A | Discharge status: Home / Self Care | Payer: Medicare Other | Source: Ambulatory Visit | Attending: Internal Medicine | Admitting: Internal Medicine

## 2018-05-07 DIAGNOSIS — Z780 Asymptomatic menopausal state: Secondary | ICD-10-CM | POA: Insufficient documentation

## 2018-05-07 DIAGNOSIS — E2839 Other primary ovarian failure: Secondary | ICD-10-CM

## 2018-05-07 DIAGNOSIS — Z1239 Encounter for other screening for malignant neoplasm of breast: Secondary | ICD-10-CM

## 2018-05-07 DIAGNOSIS — Z1231 Encounter for screening mammogram for malignant neoplasm of breast: Secondary | ICD-10-CM | POA: Diagnosis not present

## 2018-05-07 DIAGNOSIS — Z13820 Encounter for screening for osteoporosis: Secondary | ICD-10-CM | POA: Diagnosis not present

## 2018-05-07 DIAGNOSIS — M85852 Other specified disorders of bone density and structure, left thigh: Secondary | ICD-10-CM | POA: Diagnosis not present

## 2018-10-15 ENCOUNTER — Observation Stay
Admission: EM | Admit: 2018-10-15 | Discharge: 2018-10-18 | Disposition: A | Discharge status: Home / Self Care | Payer: Medicare Other | Attending: Internal Medicine | Admitting: Internal Medicine

## 2018-10-15 ENCOUNTER — Other Ambulatory Visit: Payer: Self-pay

## 2018-10-15 DIAGNOSIS — Z8249 Family history of ischemic heart disease and other diseases of the circulatory system: Secondary | ICD-10-CM | POA: Insufficient documentation

## 2018-10-15 DIAGNOSIS — F419 Anxiety disorder, unspecified: Secondary | ICD-10-CM | POA: Insufficient documentation

## 2018-10-15 DIAGNOSIS — I10 Essential (primary) hypertension: Secondary | ICD-10-CM | POA: Diagnosis not present

## 2018-10-15 DIAGNOSIS — F329 Major depressive disorder, single episode, unspecified: Secondary | ICD-10-CM | POA: Diagnosis not present

## 2018-10-15 DIAGNOSIS — G4733 Obstructive sleep apnea (adult) (pediatric): Secondary | ICD-10-CM | POA: Diagnosis not present

## 2018-10-15 DIAGNOSIS — K625 Hemorrhage of anus and rectum: Secondary | ICD-10-CM | POA: Diagnosis present

## 2018-10-15 DIAGNOSIS — J45909 Unspecified asthma, uncomplicated: Secondary | ICD-10-CM | POA: Diagnosis not present

## 2018-10-15 DIAGNOSIS — K5730 Diverticulosis of large intestine without perforation or abscess without bleeding: Secondary | ICD-10-CM | POA: Diagnosis not present

## 2018-10-15 DIAGNOSIS — Z79899 Other long term (current) drug therapy: Secondary | ICD-10-CM | POA: Insufficient documentation

## 2018-10-15 DIAGNOSIS — K921 Melena: Secondary | ICD-10-CM

## 2018-10-15 HISTORY — DX: Hemorrhage of anus and rectum: K62.5

## 2018-10-15 LAB — COMPREHENSIVE METABOLIC PANEL
ALT: 7 U/L (ref 0–44)
AST: 18 U/L (ref 15–41)
Albumin: 3.6 g/dL (ref 3.5–5.0)
Alkaline Phosphatase: 94 U/L (ref 38–126)
Anion gap: 11 (ref 5–15)
BUN: 19 mg/dL (ref 8–23)
CO2: 26 mmol/L (ref 22–32)
Calcium: 8.6 mg/dL — ABNORMAL LOW (ref 8.9–10.3)
Chloride: 103 mmol/L (ref 98–111)
Creatinine, Ser: 0.77 mg/dL (ref 0.44–1.00)
GFR calc Af Amer: >60 mL/min (ref >60)
GFR calc non Af Amer: >60 mL/min (ref >60)
Glucose, Bld: 112 mg/dL — ABNORMAL HIGH (ref 70–99)
Potassium: 3.6 mmol/L (ref 3.5–5.1)
Sodium: 140 mmol/L (ref 135–145)
Total Bilirubin: 0.6 mg/dL (ref 0.3–1.2)
Total Protein: 7.3 g/dL (ref 6.5–8.1)

## 2018-10-15 LAB — CBC
HCT: 37.9 % (ref 36.0–46.0)
Hemoglobin: 11.6 g/dL — ABNORMAL LOW (ref 12.0–15.0)
MCH: 28.4 pg (ref 26.0–34.0)
MCHC: 30.6 g/dL (ref 30.0–36.0)
MCV: 92.7 fL (ref 80.0–100.0)
Platelets: 205 10*3/uL (ref 150–400)
RBC: 4.09 MIL/uL (ref 3.87–5.11)
RDW: 14.8 % (ref 11.5–15.5)
WBC: 6.8 10*3/uL (ref 4.0–10.5)
nRBC: 0 % (ref 0.0–0.2)

## 2018-10-15 LAB — TYPE AND SCREEN
ABO/RH(D): O POS
Antibody Screen: NEGATIVE

## 2018-10-15 MED ORDER — ONDANSETRON HCL 4 MG/2ML IJ SOLN: 4.0000 mg | Freq: Four times a day (QID) | INTRAMUSCULAR | Status: DC | PRN

## 2018-10-15 MED ORDER — TRAZODONE HCL 50 MG PO TABS: 25.0000 mg | ORAL_TABLET | Freq: Every evening | ORAL | Status: DC | PRN

## 2018-10-15 MED ORDER — DOCUSATE SODIUM 100 MG PO CAPS
100.0000 mg | ORAL_CAPSULE | Freq: Two times a day (BID) | ORAL | Status: DC
  Administered 2018-10-16: 10:00:00 100 mg via ORAL
  Filled 2018-10-15 (×2): qty 1

## 2018-10-15 MED ORDER — DEXTROSE-NACL 5-0.45 % IV SOLN
INTRAVENOUS | Status: DC
  Administered 2018-10-15 – 2018-10-17 (×4): via INTRAVENOUS

## 2018-10-15 MED ORDER — BISACODYL 5 MG PO TBEC: 5.0000 mg | DELAYED_RELEASE_TABLET | Freq: Every day | ORAL | Status: DC | PRN

## 2018-10-15 MED ORDER — ONDANSETRON HCL 4 MG PO TABS: 4.0000 mg | ORAL_TABLET | Freq: Four times a day (QID) | ORAL | Status: DC | PRN

## 2018-10-15 MED ORDER — ACETAMINOPHEN 325 MG PO TABS: 650.0000 mg | ORAL_TABLET | Freq: Four times a day (QID) | ORAL | Status: DC | PRN

## 2018-10-15 MED ORDER — ACETAMINOPHEN 650 MG RE SUPP: 650.0000 mg | Freq: Four times a day (QID) | RECTAL | Status: DC | PRN

## 2018-10-15 MED ORDER — HYDRALAZINE HCL 20 MG/ML IJ SOLN: 10.0000 mg | Freq: Four times a day (QID) | INTRAMUSCULAR | Status: DC | PRN

## 2018-10-15 NOTE — ED Triage Notes (Signed)
Pt arriveed via [facility] from home with c/o rectal bleeding and abdominal pain. EMS states that pt started having dark black stools approximately 40 min ago. EMS states pt has hx of rectal bleeding with surgeries. EMS states that pt has hx of HTN but no meds in the last 2 years.

## 2018-10-15 NOTE — ED Notes (Signed)
Lab called and stated that type and screen had hemolyzed. New type and screen sent.

## 2018-10-15 NOTE — ED Provider Notes (Signed)
[facility] Emergency Department Provider Note  Time seen: 8:32 PM  I have reviewed the triage vital signs and the nursing notes.   HISTORY  Chief Complaint Rectal Bleeding and Abdominal Pain   HPI  Taylor Johnson is a 73 y.o. female with a past medical history of anxiety, depression, obesity, mental/developmental delay, presents to the emergency department for rectal bleeding.  According to the patient she was moving furniture, and then developed rectal bleeding tonight.  Denies any abdominal pain.  Patient states one episode of nausea and vomiting earlier today but denies any blood in the vomit.  Patient denies any fever, cough or congestion.  No recent travel.   Past Medical History:  Diagnosis Date  . Anxiety   . Anxiety, generalized 05/11/2015  . Asthma   . Cough 06/02/2016   Chronic - followed by Pulmonary  . Depression   . Developmental delay   . Diverticulitis   . Diverticulosis of colon 05/11/2015  . Dysphagia 10/15/2015   Routine Ba Swallow normal; rec modified barium swallow   . Hypoxia 09/12/2015  . Leg swelling   . Localized edema 04/15/2016  . Microscopic hematuria 04/15/2016  . OSA on CPAP 11/18/2015   CPAP @ 18 cm H2O started 02/2016  . Urge incontinence of urine 05/12/2015    Patient Active Problem List   Diagnosis Date Noted  . Functional systolic murmur 16/04/9603  . PAT (paroxysmal atrial tachycardia) ([facility]) 03/21/2017  . Orthostatic hypotension 03/16/2017  . Developmental delay disorder 02/06/2017  . Nausea and vomiting 01/29/2017  . Abdominal pain 01/27/2017  . Calculus of gallbladder without cholecystitis without obstruction 01/20/2017  . Cough 06/02/2016  . Localized edema 04/15/2016  . Microscopic hematuria 04/15/2016  . OSA on CPAP 11/18/2015  . Dysphagia 10/15/2015  . Hypoxia 09/12/2015  . Urge incontinence of urine 05/12/2015  . Diverticulosis of colon 05/11/2015  . Anxiety, generalized 05/11/2015    Past Surgical  History:  Procedure Laterality Date  . ABDOMINAL HYSTERECTOMY    . BREAST BIOPSY Left    neg  . COLON SURGERY    . ESOPHAGOGASTRODUODENOSCOPY (EGD) WITH PROPOFOL N/A 01/29/2017   Procedure: ESOPHAGOGASTRODUODENOSCOPY (EGD) WITH PROPOFOL;  Surgeon: Wilford Corner, MD;  Location: [facility] ENDOSCOPY;  Service: Endoscopy;  Laterality: N/A;    Prior to Admission medications   Medication Sig Start Date End Date Taking? Authorizing Provider  famotidine (PEPCID) 20 MG tablet Take 1 tablet (20 mg total) by mouth 2 (two) times daily. Patient not taking: Reported on 10/15/2018 03/05/18   [name], MD  ondansetron (ZOFRAN) 4 MG tablet Take 1 tablet (4 mg total) by mouth every 6 (six) hours as needed for nausea. Patient not taking: Reported on 10/15/2018 01/31/17   Dustin Flock, MD    No Known Allergies  Family History  Problem Relation Age of Onset  . Hypertension Mother   . Kidney disease Mother   . Dementia Mother   . Cancer Father        lung  . Bladder Cancer Neg Hx   . Breast cancer Neg Hx     Social History Social History   Tobacco Use  . Smoking status: Never Smoker  . Smokeless tobacco: Never Used  . Tobacco comment: smoking cessation materials not required  Substance Use Topics  . Alcohol use: No    Alcohol/week: 0.0 standard drinks  . Drug use: No    Review of Systems Constitutional: Negative for fever Cardiovascular: Negative for chest pain. Respiratory: Negative  for shortness of breath. Gastrointestinal: Negative for abdominal pain, vomiting positive for rectal bleeding. Musculoskeletal: Negative for musculoskeletal complaints Skin: Negative for skin complaints  Neurological: Negative for headache All other ROS negative  ____________________________________________   PHYSICAL EXAM:  VITAL SIGNS: ED Triage Vitals  Enc Vitals Group     BP 10/15/18 2015 (!) 201/77     Pulse Rate 10/15/18 2015 81     Resp 10/15/18 2015 20     Temp 10/15/18 2015 98.4  F (36.9 C)     Temp Source 10/15/18 2015 Oral     SpO2 10/15/18 2015 100 %     Weight 10/15/18 2011 260 lb (117.9 kg)     Height 10/15/18 2011 5\' 7"  (1.702 m)     Head Circumference --      [facility] Flow --      Pain Score 10/15/18 2011 0     Pain Loc --      Pain Edu? --      Excl. in [place]? --    Constitutional: Alert and oriented. Well appearing and in no distress. Eyes: Normal exam ENT   Head: Normocephalic and atraumatic.   Mouth/Throat: Mucous membranes are moist. Cardiovascular: Normal rate, regular rhythm.  Respiratory: Normal respiratory effort without tachypnea nor retractions. Breath sounds are clear  Gastrointestinal: Soft and nontender. No distention.   Musculoskeletal: Nontender with normal range of motion in all extremities. Neurologic:  Normal speech and language. No gross focal neurologic deficits Skin:  Skin is warm, dry and intact.  Psychiatric: Mood and affect are normal.   ____________________________________________   EKG viewed and interpreted by myself shows a normal sinus rhythm 84 bpm with a narrow QRS, normal axis, normal intervals, no concerning ST changes.   INITIAL IMPRESSION / ASSESSMENT AND PLAN / ED COURSE  Pertinent labs & imaging results that were available during my care of the patient were reviewed by me and considered in my medical decision making (see chart for details).  Patient presents to the emergency department for rectal bleeding.  Denies any abdominal pain.  No fever.  Nontender abdomen.  On rectal examination patient has gross hematochezia.  We will check labs including type and screen.  No abdominal tenderness on exam, no signs of upper GI bleed.  Blood is bright red suspect lower GI bleed such as diverticulosis, AVM.  Less likely diverticulitis given nontender abdomen.  Patient's lab work is largely nonrevealing hemoglobin currently 11.6, down approximately one-point from 8 months ago.  Patient will be admitted to the hospitalist  service given gross hematochezia on exam.  ____________________________________________   FINAL CLINICAL IMPRESSION(S) / ED DIAGNOSES  GI bleed, lower   Harvest Dark, MD 10/15/18 2131

## 2018-10-15 NOTE — ED Notes (Signed)
MD at bedside to do rectal exam. Bleeding noted at rectum.

## 2018-10-15 NOTE — H&P (Signed)
[place] [facility] [place] NAME: Taylor Johnson    MR#:  [id number]  DATE OF BIRTH:  06-30-1946  DATE OF ADMISSION:  10/15/2018  PRIMARY CARE PHYSICIAN: [name], MD   REQUESTING/REFERRING PHYSICIAN: ER physician  CHIEF COMPLAINT:   Chief Complaint  Patient presents with  . Rectal Bleeding  . Abdominal Pain    HISTORY OF PRESENT ILLNESS:  Taylor Johnson  is a 73 y.o. female with a known history of developmental delay, sleep apnea on CPAP, PAT, history of rectal bleeding status post colon surgery who currently lives with her mother presents to the emergency room with an episode of rectal bleeding.  According to the patient she was moving furniture and developed bright red blood per rectum episodes tonight.  Patient denies abdominal pain but reports some cramps and nausea and vomiting but denies hematemesis.  No fever or chills or cough or shortness of breath.  No dizziness or palpitation.  Patient does not remember the last colonoscopy but reports she had a rectal bleeding and had a colon surgery few years ago.  On arrival to emergency room her vitals are stable except elevated blood pressure.  Laboratory work-up shows normal electrolytes and creatinine.  Hemoglobin is 11.6.  White count is 6.8.  Rectal exam shows frank gross hematochezia.  Patient has remained hemodynamically stable.  No further episode of rectal bleeding in the emergency room.  Patient does not have abdominal tenderness on exam.  She was referred to hospitalist for admission.  PAST MEDICAL HISTORY:   Past Medical History:  Diagnosis Date  . Anxiety   . Anxiety, generalized 05/11/2015  . Asthma   . Cough 06/02/2016   Chronic - followed by Pulmonary  . Depression   . Developmental delay   . Diverticulitis   . Diverticulosis of colon 05/11/2015  . Dysphagia 10/15/2015   Routine Ba Swallow normal; rec modified barium swallow   . Hypoxia 09/12/2015  . Leg swelling   .  Localized edema 04/15/2016  . Microscopic hematuria 04/15/2016  . OSA on CPAP 11/18/2015   CPAP @ 18 cm H2O started 02/2016  . Urge incontinence of urine 05/12/2015    PAST SURGICAL HISTORY:   Past Surgical History:  Procedure Laterality Date  . ABDOMINAL HYSTERECTOMY    . BREAST BIOPSY Left    neg  . COLON SURGERY    . ESOPHAGOGASTRODUODENOSCOPY (EGD) WITH PROPOFOL N/A 01/29/2017   Procedure: ESOPHAGOGASTRODUODENOSCOPY (EGD) WITH PROPOFOL;  Surgeon: Wilford Corner, MD;  Location: [facility] ENDOSCOPY;  Service: Endoscopy;  Laterality: N/A;    SOCIAL HISTORY:   Social History   Tobacco Use  . Smoking status: Never Smoker  . Smokeless tobacco: Never Used  . Tobacco comment: smoking cessation materials not required  Substance Use Topics  . Alcohol use: No    Alcohol/week: 0.0 standard drinks    FAMILY HISTORY:   Family History  Problem Relation Age of Onset  . Hypertension Mother   . Kidney disease Mother   . Dementia Mother   . Cancer Father        lung  . Bladder Cancer Neg Hx   . Breast cancer Neg Hx     DRUG ALLERGIES:  No Known Allergies  REVIEW OF SYSTEMS:   ROS Constitutional: Negative for fever Cardiovascular: Negative for chest pain. Respiratory: Negative for shortness of breath. Gastrointestinal: Negative for abdominal pain, vomiting positive for rectal bleeding. Musculoskeletal: Negative for musculoskeletal complaints Skin: Negative for skin complaints  Neurological: Negative for headache All other ROS negative  MEDICATIONS AT HOME:   Prior to Admission medications   Medication Sig Start Date End Date Taking? Authorizing Provider  famotidine (PEPCID) 20 MG tablet Take 1 tablet (20 mg total) by mouth 2 (two) times daily. Patient not taking: Reported on 10/15/2018 03/05/18   [name], MD  ondansetron (ZOFRAN) 4 MG tablet Take 1 tablet (4 mg total) by mouth every 6 (six) hours as needed for nausea. Patient not taking: Reported on 10/15/2018 01/31/17    Dustin Flock, MD      VITAL SIGNS:  Blood pressure (!) 170/75, pulse 76, temperature 98.4 F (36.9 C), temperature source Oral, resp. rate 19, height 5\' 7"  (1.702 m), weight 117.9 kg, SpO2 98 %.  PHYSICAL EXAMINATION:  Physical Exam  GENERAL:  73 y.o.-year-old patient lying in the bed with no acute distress.  EYES: Pupils equal, round, reactive to light and accommodation. No scleral icterus. Extraocular muscles intact.  HEENT: Head atraumatic, normocephalic. Oropharynx and nasopharynx clear.  NECK:  Supple, no jugular venous distention. No thyroid enlargement, no tenderness.  LUNGS: Normal breath sounds bilaterally, no wheezing, rales,rhonchi or crepitation. No use of accessory muscles of respiration.  CARDIOVASCULAR: S1, S2 normal. No murmurs, rubs, or gallops.  ABDOMEN: Soft, nontender, nondistended. Bowel sounds present. No organomegaly or mass.  Rectal exam: Deferred but as per ER physician had frank blood on exam. EXTREMITIES: No pedal edema, cyanosis, or clubbing.  NEUROLOGIC: Cranial nerves II through XII are intact. Muscle strength 5/5 in all extremities. Sensation intact. Gait not checked.  PSYCHIATRIC: The patient is alert and oriented x 3.  SKIN: No obvious rash, lesion, or ulcer.   LABORATORY PANEL:   CBC Recent Labs  Lab 10/15/18 2017  WBC 6.8  HGB 11.6*  HCT 37.9  PLT 205   ------------------------------------------------------------------------------------------------------------------  Chemistries  Recent Labs  Lab 10/15/18 2017  NA 140  K 3.6  CL 103  CO2 26  GLUCOSE 112*  BUN 19  CREATININE 0.77  CALCIUM 8.6*  AST 18  ALT 7  ALKPHOS 94  BILITOT 0.6   ------------------------------------------------------------------------------------------------------------------  Cardiac Enzymes No results for input(s): TROPONINI in the last 168  hours. ------------------------------------------------------------------------------------------------------------------  RADIOLOGY:  No results found.    IMPRESSION AND PLAN:   This is 73 year old African-American female with prior history of rectal bleeding post colon surgery, PAT, sleep apnea presented with episode of rectal bleeding.  1.  Rectal bleeding: Likely lower GI bleed due to diverticulosis however no evidence of diverticulitis as patient does not have pain or tenderness.  Patient is afebrile no fever or leukocytosis.  Will admit patient under observation status and on cardiac monitor bed.  Continue check H&H serially.  Patient will be kept n.p.o. for possible colonoscopy in the morning.  Will consult GI service which has been informed.  PRBC transfusion if needed.  2.  Sleep apnea: We will order CPAP at night.  3.  Patient will be kept n.p.o.  4.  SCD for DVT prophylaxis    All the records are reviewed and case discussed with ED provider. Management plans discussed with the patient, family and they are in agreement.  CODE STATUS: Full code  TOTAL TIME TAKING CARE OF THIS PATIENT: 45 minutes.    Marygrace Drought M.D on 10/15/2018 at 10:08 PM  Between 7am to 6pm - Pager - [phone]  After 6pm go to [email or web address] - [facility]  Office  [phone]  CC:  Primary care physician; [name], MD   Note: This dictation was prepared with Dragon dictation along with smaller phrase technology. Any transcriptional errors that result from this process are unintentional.

## 2018-10-15 NOTE — ED Notes (Addendum)
Pt cleaned by this RN and Beth, NT. Pt had some clots and blood around rectum. No active bleeding noted at this time. Will continue to monitor.

## 2018-10-15 NOTE — ED Notes (Signed)
ED TO INPATIENT HANDOFF REPORT  ED Nurse Name and Phone #: [phone]  S Name/Age/Gender Taylor Johnson 73 y.o. female Room/Bed: ED13A/ED13A  Code Status   Code Status: Prior  Home/SNF/Other Rehab Patient oriented to: self, place, time and situation Is this baseline? Yes   Triage Complete: Triage complete  Chief Complaint GI bleed  Triage Note Pt arriveed via [facility] from home with c/o rectal bleeding and abdominal pain. EMS states that pt started having dark black stools approximately 40 min ago. EMS states pt has hx of rectal bleeding with surgeries. EMS states that pt has hx of HTN but no meds in the last 2 years.    Allergies No Known Allergies  Level of Care/Admitting Diagnosis ED Disposition    ED Disposition Condition [facility] Area: [place] [100120]  Level of Care: Med-Surg [16]  Diagnosis: Rectal bleeding [154008]  Admitting Physician: Marygrace Drought [[id number]]  Attending Physician: Marygrace Drought [[id number]]  PT Class (Do Not Modify): Observation [104]  PT Acc Code (Do Not Modify): Observation [10022]       B Medical/Surgery History Past Medical History:  Diagnosis Date  . Anxiety   . Anxiety, generalized 05/11/2015  . Asthma   . Cough 06/02/2016   Chronic - followed by Pulmonary  . Depression   . Developmental delay   . Diverticulitis   . Diverticulosis of colon 05/11/2015  . Dysphagia 10/15/2015   Routine Ba Swallow normal; rec modified barium swallow   . Hypoxia 09/12/2015  . Leg swelling   . Localized edema 04/15/2016  . Microscopic hematuria 04/15/2016  . OSA on CPAP 11/18/2015   CPAP @ 18 cm H2O started 02/2016  . Urge incontinence of urine 05/12/2015   Past Surgical History:  Procedure Laterality Date  . ABDOMINAL HYSTERECTOMY    . BREAST BIOPSY Left    neg  . COLON SURGERY    . ESOPHAGOGASTRODUODENOSCOPY (EGD) WITH PROPOFOL N/A 01/29/2017   Procedure: ESOPHAGOGASTRODUODENOSCOPY (EGD) WITH PROPOFOL;   Surgeon: Wilford Corner, MD;  Location: [facility] ENDOSCOPY;  Service: Endoscopy;  Laterality: N/A;     A IV Location/Drains/Wounds Patient Lines/Drains/Airways Status   Active Line/Drains/Airways    Name:   Placement date:   Placement time:   Site:   Days:   Peripheral IV 10/15/18 Left Antecubital   10/15/18    -    Antecubital   less than 1          Intake/Output Last 24 hours No intake or output data in the 24 hours ending 10/15/18 2140  Labs/Imaging Results for orders placed or performed during the hospital encounter of 10/15/18 (from the past 48 hour(s))  Comprehensive metabolic panel     Status: Abnormal   Collection Time: 10/15/18  8:17 PM  Result Value Ref Range   Sodium 140 135 - 145 mmol/L   Potassium 3.6 3.5 - 5.1 mmol/L    Comment: HEMOLYSIS AT THIS LEVEL MAY AFFECT RESULT   Chloride 103 98 - 111 mmol/L   CO2 26 22 - 32 mmol/L   Glucose, Bld 112 (H) 70 - 99 mg/dL   BUN 19 8 - 23 mg/dL   Creatinine, Ser 0.77 0.44 - 1.00 mg/dL   Calcium 8.6 (L) 8.9 - 10.3 mg/dL   Total Protein 7.3 6.5 - 8.1 g/dL   Albumin 3.6 3.5 - 5.0 g/dL   AST 18 15 - 41 U/L    Comment: HEMOLYSIS AT THIS LEVEL MAY AFFECT RESULT   ALT  7 0 - 44 U/L   Alkaline Phosphatase 94 38 - 126 U/L   Total Bilirubin 0.6 0.3 - 1.2 mg/dL    Comment: HEMOLYSIS AT THIS LEVEL MAY AFFECT RESULT   GFR calc non Af Amer >60 >60 mL/min   GFR calc Af Amer >60 >60 mL/min   Anion gap 11 5 - 15    Comment: Performed at [facility], [address] [place], [place] [address]  CBC     Status: Abnormal   Collection Time: 10/15/18  8:17 PM  Result Value Ref Range   WBC 6.8 4.0 - 10.5 K/uL   RBC 4.09 3.87 - 5.11 MIL/uL   Hemoglobin 11.6 (L) 12.0 - 15.0 g/dL   HCT 37.9 36.0 - 46.0 %   MCV 92.7 80.0 - 100.0 fL   MCH 28.4 26.0 - 34.0 pg   MCHC 30.6 30.0 - 36.0 g/dL   RDW 14.8 11.5 - 15.5 %   Platelets 205 150 - 400 K/uL   nRBC 0.0 0.0 - 0.2 %    Comment: Performed at [facility], [address] [place], [place] [address]  Type and screen [place]     Status: None (Preliminary result)   Collection Time: 10/15/18  8:17 PM  Result Value Ref Range   ABO/RH(D) PENDING    Antibody Screen PENDING    Sample Expiration      10/18/2018 Performed at [facility], [address] [place], [place] [address]   Type and screen     Status: None (Preliminary result)   Collection Time: 10/15/18  8:57 PM  Result Value Ref Range   ABO/RH(D) PENDING    Antibody Screen PENDING    Sample Expiration      10/18/2018 Performed at [facility], [address], [place], [place] [address]    No results found.  Pending Labs [employer] (From admission, onward)    Start     Ordered   Signed and Occupational hygienist morning,   R     Signed and Held   Signed and Held  CBC  Tomorrow morning,   R     Signed and Held   Signed and Held  Hemoglobin  4 times daily,   R     Signed and Held   Signed and Held  Hematocrit  4 times daily,   R     Signed and Held          Vitals/Pain Today's Vitals   10/15/18 2015 10/15/18 2030 10/15/18 2045 10/15/18 2100  BP: (!) 201/77 (!) 188/80  (!) 170/75  Pulse: 81 79 75 76  Resp: 20 (!) 31 (!) 24 19  Temp: 98.4 F (36.9 C)     TempSrc: Oral     SpO2: 100% 99% 97% 98%  Weight:      Height:      PainSc:        Isolation Precautions No active isolations  Medications Medications - No data to display  Mobility walks with device Moderate fall risk   Focused Assessments Cardiac Assessment Handoff:  Cardiac Rhythm: Normal sinus rhythm   R Recommendations: See Admitting Provider Note  Report given to:   Additional Notes: Pt can not read or write, pt has trouble verbalizing concerns.

## 2018-10-16 DIAGNOSIS — K625 Hemorrhage of anus and rectum: Secondary | ICD-10-CM | POA: Diagnosis not present

## 2018-10-16 DIAGNOSIS — K921 Melena: Secondary | ICD-10-CM | POA: Diagnosis not present

## 2018-10-16 LAB — HEMOGLOBIN AND HEMATOCRIT, BLOOD
HCT: 34.9 % — ABNORMAL LOW (ref 36.0–46.0)
Hemoglobin: 10.6 g/dL — ABNORMAL LOW (ref 12.0–15.0)

## 2018-10-16 LAB — CBC
HCT: 35.1 % — ABNORMAL LOW (ref 36.0–46.0)
Hemoglobin: 10.8 g/dL — ABNORMAL LOW (ref 12.0–15.0)
MCH: 28.3 pg (ref 26.0–34.0)
MCHC: 30.8 g/dL (ref 30.0–36.0)
MCV: 91.9 fL (ref 80.0–100.0)
Platelets: 197 10*3/uL (ref 150–400)
RBC: 3.82 MIL/uL — ABNORMAL LOW (ref 3.87–5.11)
RDW: 14.9 % (ref 11.5–15.5)
WBC: 6.8 10*3/uL (ref 4.0–10.5)
nRBC: 0 % (ref 0.0–0.2)

## 2018-10-16 LAB — HEMATOCRIT: HCT: 33.4 % — ABNORMAL LOW (ref 36.0–46.0)

## 2018-10-16 LAB — BASIC METABOLIC PANEL
Anion gap: 10 (ref 5–15)
BUN: 20 mg/dL (ref 8–23)
CO2: 28 mmol/L (ref 22–32)
Calcium: 9 mg/dL (ref 8.9–10.3)
Chloride: 104 mmol/L (ref 98–111)
Creatinine, Ser: 0.69 mg/dL (ref 0.44–1.00)
GFR calc Af Amer: >60 mL/min (ref >60)
GFR calc non Af Amer: >60 mL/min (ref >60)
Glucose, Bld: 121 mg/dL — ABNORMAL HIGH (ref 70–99)
Potassium: 3.6 mmol/L (ref 3.5–5.1)
Sodium: 142 mmol/L (ref 135–145)

## 2018-10-16 LAB — HEMOGLOBIN: Hemoglobin: 10.2 g/dL — ABNORMAL LOW (ref 12.0–15.0)

## 2018-10-16 LAB — GLUCOSE, CAPILLARY: Glucose-Capillary: 104 mg/dL — ABNORMAL HIGH (ref 70–99)

## 2018-10-16 MED ORDER — HYDRALAZINE HCL 50 MG PO TABS
25.0000 mg | ORAL_TABLET | Freq: Three times a day (TID) | ORAL | Status: DC
  Administered 2018-10-16 – 2018-10-17 (×3): 25 mg via ORAL
  Filled 2018-10-16 (×3): qty 1

## 2018-10-16 NOTE — Progress Notes (Signed)
[place] at [place] NAME: Taylor Johnson    MR#:  [id number]  DATE OF BIRTH:  04/10/1946  SUBJECTIVE:  CHIEF COMPLAINT:   Chief Complaint  Patient presents with  . Rectal Bleeding  . Abdominal Pain  reports some dark blood coming out this am, BP up, would like to eat REVIEW OF SYSTEMS:  Review of Systems  Constitutional: Negative for diaphoresis, fever, malaise/fatigue and weight loss.  HENT: Negative for ear discharge, ear pain, hearing loss, nosebleeds, sore throat and tinnitus.   Eyes: Negative for blurred vision and pain.  Respiratory: Negative for cough, hemoptysis, shortness of breath and wheezing.   Cardiovascular: Negative for chest pain, palpitations, orthopnea and leg swelling.  Gastrointestinal: Positive for blood in stool and melena. Negative for abdominal pain, constipation, diarrhea, heartburn, nausea and vomiting.  Genitourinary: Negative for dysuria, frequency and urgency.  Musculoskeletal: Negative for back pain and myalgias.  Skin: Negative for itching and rash.  Neurological: Negative for dizziness, tingling, tremors, focal weakness, seizures, weakness and headaches.  Psychiatric/Behavioral: Negative for depression. The patient is not nervous/anxious.    DRUG ALLERGIES:  No Known Allergies VITALS:  Blood pressure (!) 161/82, pulse 61, temperature 98.2 F (36.8 C), temperature source Axillary, resp. rate 17, height 5\' 6"  (1.676 m), weight 117.2 kg, SpO2 96 %. PHYSICAL EXAMINATION:  Physical Exam HENT:     Head: Normocephalic and atraumatic.  Eyes:     Conjunctiva/sclera: Conjunctivae normal.     Pupils: Pupils are equal, round, and reactive to light.  Neck:     Musculoskeletal: Normal range of motion and neck supple.     Thyroid: No thyromegaly.     Trachea: No tracheal deviation.  Cardiovascular:     Rate and Rhythm: Normal rate and regular rhythm.     Heart sounds: Normal heart sounds.  Pulmonary:   Effort: Pulmonary effort is normal. No respiratory distress.     Breath sounds: Normal breath sounds. No wheezing.  Chest:     Chest wall: No tenderness.  Abdominal:     General: Bowel sounds are normal. There is no distension.     Palpations: Abdomen is soft.     Tenderness: There is no abdominal tenderness.  Musculoskeletal: Normal range of motion.  Skin:    General: Skin is warm and dry.     Findings: No rash.  Neurological:     Mental Status: She is alert and oriented to person, place, and time.     Cranial Nerves: No cranial nerve deficit.    LABORATORY PANEL:  Female CBC Recent Labs  Lab 10/16/18 0206  10/16/18 1253  WBC 6.8  --   --   HGB 10.8*   < > 10.6*  HCT 35.1*   < > 34.9*  PLT 197  --   --    < > = values in this interval not displayed.   ------------------------------------------------------------------------------------------------------------------ Chemistries  Recent Labs  Lab 10/15/18 2017 10/16/18 0206  NA 140 142  K 3.6 3.6  CL 103 104  CO2 26 28  GLUCOSE 112* 121*  BUN 19 20  CREATININE 0.77 0.69  CALCIUM 8.6* 9.0  AST 18  --   ALT 7  --   ALKPHOS 94  --   BILITOT 0.6  --    RADIOLOGY:  No results found. ASSESSMENT AND PLAN:  This is 73 year old African-American female with prior history of rectal bleeding post colon surgery, PAT, sleep apnea admitted with episode of  rectal bleeding.  1.  Rectal bleeding: Likely lower GI bleed due to diverticulosis however no evidence of diverticulitis as patient does not have pain or tenderness.  Patient is afebrile no fever or leukocytosis.  Continue check H&H serially.  Patient will be kept n.p.o. for possible colonoscopy in the morning.   - Appreciate GI input, no plan for endoscopy while here - Hb stable - start CLD and advance as tolerated  2. Anemia: acute on chronic blood loss, stable, monitor  3. Uncontrolled HTN: start hydralazine 25 mg PO tid and continue IV prn  4. Abd pain: much better  likely due to lower GI bleed     All the records are reviewed and case discussed with Care Management/Social Worker. Management plans discussed with the patient, nursing and they are in agreement.  CODE STATUS: Full Code  TOTAL TIME TAKING CARE OF THIS PATIENT: 35 minutes.   More than 50% of the time was spent in counseling/coordination of care: YES  POSSIBLE D/C IN 1-2 DAYS, DEPENDING ON CLINICAL CONDITION.   [name] M.D on 72/31/2020 at 3:01 PM  Between 7am to 6pm - Pager - [phone]  After 6pm go to [email or web address] - Proofreader  [employer] [place] [facility]  Office  [phone]  CC: Primary care physician; [name], MD  Note: This dictation was prepared with Dragon dictation along with smaller phrase technology. Any transcriptional errors that result from this process are unintentional.

## 2018-10-16 NOTE — Consult Note (Signed)
Taylor Darby, MD [address]  [place], [place] [address]  Main: [phone]  Fax: [phone] Pager: [phone]   Consultation  Referring Provider:     No ref. provider found Primary Care Physician:  [name], MD Primary Gastroenterologist: None         Reason for Consultation:     Rectal bleeding  Date of Admission:  10/15/2018 Date of Consultation:  10/16/2018         HPI:   Taylor Johnson is a 73 y.o. female with history of colonic diverticulosis, history of ?segmental resection of the colon for diverticulitis as reported by patient and had a past medical history as listed below presented to ER yesterday after she noticed painless rectal bleeding when she was moving furniture.  Patient apparently lives with her mother at home.  She denies any abdominal pain, nausea or vomiting.  She reported having 3-4 episodes of dark red blood per rectum.  She reports having 1 bowel movement today with scant amount of blood.  She has been hemodynamically stable. Her baseline hemoglobin is normal as of 01/2018, dropped to 11.6 on admission, 10.2 today.  BUN and creatinine are not elevated.  Patient denies constipation or diarrhea and reports having regular bowel movements prior to this episode  NSAIDs: None  Antiplts/Anticoagulants/Anti thrombotics: Aspirin 81 intermittent  GI Procedures: EGD 01/2017 - Normal esophagus. - Z-line regular, 42 cm from the incisors. - Normal stomach. - Normal examined duodenum. - No specimens collected.  Colonoscopy 04/2014 for colon cancer screening Good prep Pancolonic diverticulosis  Past Medical History:  Diagnosis Date  . Anxiety   . Anxiety, generalized 05/11/2015  . Asthma   . Cough 06/02/2016   Chronic - followed by Pulmonary  . Depression   . Developmental delay   . Diverticulitis   . Diverticulosis of colon 05/11/2015  . Dysphagia 10/15/2015   Routine Ba Swallow normal; rec modified barium swallow   . Hypoxia  09/12/2015  . Leg swelling   . Localized edema 04/15/2016  . Microscopic hematuria 04/15/2016  . OSA on CPAP 11/18/2015   CPAP @ 18 cm H2O started 02/2016  . Urge incontinence of urine 05/12/2015    Past Surgical History:  Procedure Laterality Date  . ABDOMINAL HYSTERECTOMY    . BREAST BIOPSY Left    neg  . COLON SURGERY    . ESOPHAGOGASTRODUODENOSCOPY (EGD) WITH PROPOFOL N/A 01/29/2017   Procedure: ESOPHAGOGASTRODUODENOSCOPY (EGD) WITH PROPOFOL;  Surgeon: Wilford Corner, MD;  Location: [facility] ENDOSCOPY;  Service: Endoscopy;  Laterality: N/A;    Current Facility-Administered Medications:  .  acetaminophen (TYLENOL) tablet 650 mg, 650 mg, Oral, Q6H PRN **OR** acetaminophen (TYLENOL) suppository 650 mg, 650 mg, Rectal, Q6H PRN, Avaiya, Hitesh, MD .  bisacodyl (DULCOLAX) EC tablet 5 mg, 5 mg, Oral, Daily PRN, Avaiya, Hitesh, MD .  dextrose 5 %-0.45 % sodium chloride infusion, , Intravenous, Continuous, Avaiya, Hitesh, MD, Last Rate: 75 mL/hr at 10/16/18 0631 .  docusate sodium (COLACE) capsule 100 mg, 100 mg, Oral, BID, Avaiya, Hitesh, MD, 100 mg at 10/16/18 1011 .  hydrALAZINE (APRESOLINE) injection 10 mg, 10 mg, Intravenous, Q6H PRN, Avaiya, Hitesh, MD .  hydrALAZINE (APRESOLINE) tablet 25 mg, 25 mg, Oral, Q8H, Manuella Ghazi, Vipul, MD, 25 mg at 10/16/18 1011 .  ondansetron (ZOFRAN) tablet 4 mg, 4 mg, Oral, Q6H PRN **OR** ondansetron (ZOFRAN) injection 4 mg, 4 mg, Intravenous, Q6H PRN, Avaiya, Hitesh, MD .  traZODone (DESYREL) tablet 25 mg,  25 mg, Oral, QHS PRN, Marygrace Drought, MD    Family History  Problem Relation Age of Onset  . Hypertension Mother   . Kidney disease Mother   . Dementia Mother   . Cancer Father        lung  . Bladder Cancer Neg Hx   . Breast cancer Neg Hx      Social History   Tobacco Use  . Smoking status: Never Smoker  . Smokeless tobacco: Never Used  . Tobacco comment: smoking cessation materials not required  Substance Use Topics  . Alcohol use: No     Alcohol/week: 0.0 standard drinks  . Drug use: No    Allergies as of 10/15/2018  . (No Known Allergies)    Review of Systems:    All systems reviewed and negative except where noted in HPI.   Physical Exam:  Vital signs in last 24 hours: Temp:  [97.7 F (36.5 C)-98.5 F (36.9 C)] 98.2 F (36.8 C) (03/31 1009) Pulse Rate:  [57-81] 61 (03/31 1009) Resp:  [16-31] 17 (03/31 1009) BP: (144-201)/(57-82) 161/82 (03/31 1009) SpO2:  [95 %-100 %] 96 % (03/31 1009) Weight:  [117.2 kg-117.9 kg] 117.2 kg (03/30 2257) Last BM Date: 10/15/18 General:   Pleasant, cooperative in NAD Head:  Normocephalic and atraumatic. Eyes:   No icterus.   Conjunctiva pink. PERRLA. Ears:  Normal auditory acuity. Neck:  Supple; no masses or thyroidomegaly Lungs: Respirations even and unlabored. Lungs clear to auscultation bilaterally.   No wheezes, crackles, or rhonchi.  Heart:  Regular rate and rhythm;  Without murmur, clicks, rubs or gallops Abdomen:  Soft, obese, nondistended, nontender. Normal bowel sounds. No appreciable masses or hepatomegaly.  No rebound or guarding.  Rectal: Maroon blood stain on digital rectal exam Msk:  Symmetrical without gross deformities.  Strength normal for age Extremities:  Without edema, cyanosis or clubbing. Neurologic:  Alert and oriented x3;  grossly normal neurologically. Skin:  Intact without significant lesions or rashes. Cervical Nodes:  No significant cervical adenopathy. Psych:  Alert and cooperative. Normal affect.  LAB RESULTS: CBC Latest Ref Rng & Units 10/16/2018 10/16/2018 10/15/2018  WBC 4.0 - 10.5 K/uL - 6.8 6.8  Hemoglobin 12.0 - 15.0 g/dL 10.2(L) 10.8(L) 11.6(L)  Hematocrit 36.0 - 46.0 % 33.4(L) 35.1(L) 37.9  Platelets 150 - 400 K/uL - 197 205    BMET BMP Latest Ref Rng & Units 10/16/2018 10/15/2018 02/07/2018  Glucose 70 - 99 mg/dL 121(H) 112(H) 117(H)  BUN 8 - 23 mg/dL 20 19 17   Creatinine 0.44 - 1.00 mg/dL 0.69 0.77 0.91  BUN/Creat Ratio 12 - 28 - -  -  Sodium 135 - 145 mmol/L 142 140 141  Potassium 3.5 - 5.1 mmol/L 3.6 3.6 4.0  Chloride 98 - 111 mmol/L 104 103 101  CO2 22 - 32 mmol/L 28 26 32  Calcium 8.9 - 10.3 mg/dL 9.0 8.6(L) 9.4    LFT Hepatic Function Latest Ref Rng & Units 10/15/2018 02/07/2018 01/28/2017  Total Protein 6.5 - 8.1 g/dL 7.3 7.8 7.4  Albumin 3.5 - 5.0 g/dL 3.6 3.9 3.6  AST 15 - 41 U/L 18 20 57(H)  ALT 0 - 44 U/L 7 13 62(H)  Alk Phosphatase 38 - 126 U/L 94 101 79  Total Bilirubin 0.3 - 1.2 mg/dL 0.6 0.5 0.8  Bilirubin, Direct 0.1 - 0.5 mg/dL - - -     STUDIES: No results found.    Impression / Plan:   Taylor Johnson is a 73  y.o. female with obesity, history of colonic diverticulosis admitted with painless rectal bleeding.  Differentials include diverticular bleed or bleeding from internal hemorrhoids.  Her bleeding has subsided since admission.  Appears to be self-limited, she did have a slight drop in hemoglobin.  At this time, I do not recommend further endoscopic evaluation unless her bleeding recurs or her hemoglobin continues to drop significantly    Clear liquid diet Monitor CBC every 12 hrs  Thank you for involving me in the care of this patient.  Will follow along with you    LOS: 0 days   [name], MD  10/16/2018, 12:54 PM   Note: This dictation was prepared with Dragon dictation along with smaller phrase technology. Any transcriptional errors that result from this process are unintentional.

## 2018-10-16 NOTE — TOC Initial Note (Signed)
Transition of Care [facility]) - Initial/Assessment Note    Patient Details  Name: Taylor Johnson MRN: [id number] Date of Birth: Sep 05, 1945  Transition of Care [facility]) CM/SW Contact:    Taylor Stack, RN Phone Number: 10/16/2018, 9:06 AM  Clinical Narrative:                No consult ordered and low risk for readmission. Placed in observation for rectal bleeding. Hemoglobin stable. Daughter Taylor Johnson will transport home         Patient Goals and CMS Choice        Expected Discharge Plan and Services                                    Prior Living Arrangements/Services                       Activities of Daily Living Home Assistive Devices/Equipment: [name] (specify quad or straight) ADL Screening (condition at time of admission) Patient's cognitive ability adequate to safely complete daily activities?: Yes Is the patient deaf or have difficulty hearing?: No Does the patient have difficulty seeing, even when wearing glasses/contacts?: No Does the patient have difficulty concentrating, remembering, or making decisions?: No Patient able to express need for assistance with ADLs?: Yes Does the patient have difficulty dressing or bathing?: No Independently performs ADLs?: Yes (appropriate for developmental age) Does the patient have difficulty walking or climbing stairs?: Yes Weakness of Legs: Both Weakness of Arms/Hands: None  Permission Sought/Granted                  Emotional Assessment              Admission diagnosis:  Hematochezia [K92.1] Rectal bleeding [K62.5] Patient Active Problem List   Diagnosis Date Noted  . Rectal bleeding 10/15/2018  . Functional systolic murmur 88/50/2774  . PAT (paroxysmal atrial tachycardia) ([place]) 03/21/2017  . Orthostatic hypotension 03/16/2017  . Developmental delay disorder 02/06/2017  . Nausea and vomiting 01/29/2017  . Abdominal pain 01/27/2017  . Calculus of gallbladder without cholecystitis without  obstruction 01/20/2017  . Cough 06/02/2016  . Localized edema 04/15/2016  . Microscopic hematuria 04/15/2016  . OSA on CPAP 11/18/2015  . Dysphagia 10/15/2015  . Hypoxia 09/12/2015  . Urge incontinence of urine 05/12/2015  . Diverticulosis of colon 05/11/2015  . Anxiety, generalized 05/11/2015   PCP:  Taylor Hess, MD Pharmacy:   [facility], [place], [place] [address] [place] [address] Phone: [phone] Fax: [place], [place] - [address] [place] [address] Phone: [phone] Fax: [phone]     Social Determinants of Health (SDOH) Interventions    Readmission Risk Interventions No flowsheet data found.

## 2018-10-16 NOTE — Care Management Obs Status (Signed)
[facility] NOTIFICATION   Patient Details  Name: Taylor Johnson MRN: [id number] Date of Birth: December 31, 1945   Medicare Observation Status Notification Given:  Yes  Explained over the phone and copy placed in patient's room. Sister [name] will retreive when patient discharges home  Katrina Stack, [place] 10/16/2018, 9:00 AM

## 2018-10-17 DIAGNOSIS — K625 Hemorrhage of anus and rectum: Secondary | ICD-10-CM | POA: Diagnosis not present

## 2018-10-17 DIAGNOSIS — K921 Melena: Secondary | ICD-10-CM | POA: Diagnosis not present

## 2018-10-17 LAB — BASIC METABOLIC PANEL
Anion gap: 8 (ref 5–15)
BUN: 15 mg/dL (ref 8–23)
CO2: 28 mmol/L (ref 22–32)
Calcium: 8.7 mg/dL — ABNORMAL LOW (ref 8.9–10.3)
Chloride: 104 mmol/L (ref 98–111)
Creatinine, Ser: 0.65 mg/dL (ref 0.44–1.00)
GFR calc Af Amer: >60 mL/min (ref >60)
GFR calc non Af Amer: >60 mL/min (ref >60)
Glucose, Bld: 119 mg/dL — ABNORMAL HIGH (ref 70–99)
Potassium: 3.5 mmol/L (ref 3.5–5.1)
Sodium: 140 mmol/L (ref 135–145)

## 2018-10-17 LAB — CBC
HCT: 35.7 % — ABNORMAL LOW (ref 36.0–46.0)
Hemoglobin: 10.9 g/dL — ABNORMAL LOW (ref 12.0–15.0)
MCH: 27.9 pg (ref 26.0–34.0)
MCHC: 30.5 g/dL (ref 30.0–36.0)
MCV: 91.5 fL (ref 80.0–100.0)
Platelets: 200 10*3/uL (ref 150–400)
RBC: 3.9 MIL/uL (ref 3.87–5.11)
RDW: 14.9 % (ref 11.5–15.5)
WBC: 6.5 10*3/uL (ref 4.0–10.5)
nRBC: 0 % (ref 0.0–0.2)

## 2018-10-17 LAB — FOLATE: Folate: 10.6 ng/mL (ref >5.9)

## 2018-10-17 LAB — IRON AND TIBC
Iron: 33 ug/dL (ref 28–170)
Saturation Ratios: 15 % (ref 10.4–31.8)
TIBC: 228 ug/dL — ABNORMAL LOW (ref 250–450)
UIBC: 195 ug/dL

## 2018-10-17 LAB — GLUCOSE, CAPILLARY: Glucose-Capillary: 98 mg/dL (ref 70–99)

## 2018-10-17 LAB — FERRITIN: Ferritin: 87 ng/mL (ref 11–307)

## 2018-10-17 LAB — VITAMIN B12: Vitamin B-12: 390 pg/mL (ref 180–914)

## 2018-10-17 MED ORDER — BLISTEX MEDICATED EX OINT
1.0000 "application " | TOPICAL_OINTMENT | CUTANEOUS | Status: DC | PRN
  Filled 2018-10-17: qty 6.3

## 2018-10-17 MED ORDER — HYDRALAZINE HCL 25 MG PO TABS
25.0000 mg | ORAL_TABLET | Freq: Four times a day (QID) | ORAL | Status: DC
  Administered 2018-10-17 – 2018-10-18 (×3): 25 mg via ORAL
  Filled 2018-10-17 (×5): qty 1

## 2018-10-17 MED ORDER — ORAL CARE MOUTH RINSE
15.0000 mL | Freq: Two times a day (BID) | OROMUCOSAL | Status: DC
  Administered 2018-10-17 (×2): 15 mL via OROMUCOSAL

## 2018-10-17 NOTE — Progress Notes (Signed)
Patient had a small to medium size bowel movement that was dark red/ black. Type 6 stool consistency. Bowel movement is documented on patient assessment flowsheet under the Gastrointestinal assessment heading.

## 2018-10-17 NOTE — Progress Notes (Signed)
[place] [facility] [place] NAME: Taylor Johnson    MR#:  [id number]  DATE OF BIRTH:  1945/11/29  SUBJECTIVE:  CHIEF COMPLAINT:   Chief Complaint  Patient presents with  . Rectal Bleeding  . Abdominal Pain  Still reports some dark blood coming out last night, H&H stable, blood pressure high. does not feel ready to go home yet REVIEW OF SYSTEMS:  Review of Systems  Constitutional: Negative for diaphoresis, fever, malaise/fatigue and weight loss.  HENT: Negative for ear discharge, ear pain, hearing loss, nosebleeds, sore throat and tinnitus.   Eyes: Negative for blurred vision and pain.  Respiratory: Negative for cough, hemoptysis, shortness of breath and wheezing.   Cardiovascular: Negative for chest pain, palpitations, orthopnea and leg swelling.  Gastrointestinal: Positive for blood in stool and melena. Negative for abdominal pain, constipation, diarrhea, heartburn, nausea and vomiting.  Genitourinary: Negative for dysuria, frequency and urgency.  Musculoskeletal: Negative for back pain and myalgias.  Skin: Negative for itching and rash.  Neurological: Negative for dizziness, tingling, tremors, focal weakness, seizures, weakness and headaches.  Psychiatric/Behavioral: Negative for depression. The patient is not nervous/anxious.    DRUG ALLERGIES:  No Known Allergies VITALS:  Blood pressure (!) 161/69, pulse 60, temperature 97.7 F (36.5 C), temperature source Oral, resp. rate 19, height 5\' 6"  (1.676 m), weight 117.2 kg, SpO2 96 %. PHYSICAL EXAMINATION:  Physical Exam HENT:     Head: Normocephalic and atraumatic.  Eyes:     Conjunctiva/sclera: Conjunctivae normal.     Pupils: Pupils are equal, round, and reactive to light.  Neck:     Musculoskeletal: Normal range of motion and neck supple.     Thyroid: No thyromegaly.     Trachea: No tracheal deviation.  Cardiovascular:     Rate and Rhythm: Normal rate and regular rhythm.     Heart  sounds: Normal heart sounds.  Pulmonary:     Effort: Pulmonary effort is normal. No respiratory distress.     Breath sounds: Normal breath sounds. No wheezing.  Chest:     Chest wall: No tenderness.  Abdominal:     General: Bowel sounds are normal. There is no distension.     Palpations: Abdomen is soft.     Tenderness: There is no abdominal tenderness.  Musculoskeletal: Normal range of motion.  Skin:    General: Skin is warm and dry.     Findings: No rash.  Neurological:     Mental Status: She is alert and oriented to person, place, and time.     Cranial Nerves: No cranial nerve deficit.    LABORATORY PANEL:  Female CBC Recent Labs  Lab 10/17/18 0110  WBC 6.5  HGB 10.9*  HCT 35.7*  PLT 200   ------------------------------------------------------------------------------------------------------------------ Chemistries  Recent Labs  Lab 10/15/18 2017  10/17/18 0110  NA 140   < > 140  K 3.6   < > 3.5  CL 103   < > 104  CO2 26   < > 28  GLUCOSE 112*   < > 119*  BUN 19   < > 15  CREATININE 0.77   < > 0.65  CALCIUM 8.6*   < > 8.7*  AST 18  --   --   ALT 7  --   --   ALKPHOS 94  --   --   BILITOT 0.6  --   --    < > = values in this interval not displayed.  RADIOLOGY:  No results found. ASSESSMENT AND PLAN:  This is 73 year old African-American female with prior history of rectal bleeding post colon surgery, PAT, sleep apnea admitted with episode of rectal bleeding.  1.  Rectal bleeding: Likely lower GI bleed due to diverticulosis however no evidence of diverticulitis as patient does not have pain or tenderness.  Patient is afebrile no fever or leukocytosis.  Continue check H&H serially.  Patient will be kept n.p.o. for possible colonoscopy in the morning.   - Appreciate GI input, no plan for endoscopy while here - Hb stable.  10.9 today -Tolerated CLD -we will start full liquid diet and advance as tolerated  2. Anemia: acute on chronic blood loss, H&H stable,  monitor  3. Uncontrolled HTN: Increase hydralazine 25 mg PO 4 times a day and continue hydralazine IV prn  4. Abd pain: much better likely due to lower GI bleed     All the records are reviewed and case discussed with Care Management/Social Worker. Management plans discussed with the patient, nursing and they are in agreement.  CODE STATUS: Full Code  TOTAL TIME TAKING CARE OF THIS PATIENT: 35 minutes.   More than 50% of the time was spent in counseling/coordination of care: YES  POSSIBLE D/C IN 1-2 DAYS, DEPENDING ON CLINICAL CONDITION.   [name] M.D on 10/17/2018 at 11:04 AM  Between 7am to 6pm - Pager - [phone]  After 6pm go to [email or web address] - Proofreader  [facility]  Office  [phone]  CC: Primary care physician; [name], MD  Note: This dictation was prepared with Dragon dictation along with smaller phrase technology. Any transcriptional errors that result from this process are unintentional.

## 2018-10-17 NOTE — Progress Notes (Signed)
Taylor Darby, MD [address]  [place]  [place], [place] [address]  Main: [phone]  Fax: [phone] Pager: [phone]   Subjective: She did not have bleeding episodes since yesterday morning, she does not have any complaints. Hb stable   Objective: Vital signs in last 24 hours: Vitals:   10/16/18 1521 10/16/18 1815 10/16/18 1931 10/17/18 0451  BP: 138/65 (!) 179/73 (!) 153/68 (!) 161/69  Pulse: 65 60 (!) 57 60  Resp: 18 20 18 19   Temp: 98.1 F (36.7 C) 97.7 F (36.5 C) (!) 97.5 F (36.4 C) 97.7 F (36.5 C)  TempSrc: Oral Oral Oral Oral  SpO2: 96% 95% 96% 96%  Weight:      Height:       Weight change:   Intake/Output Summary (Last 24 hours) at 10/17/2018 1200 Last data filed at 10/17/2018 1140 Gross per 24 hour  Intake 2402.13 ml  Output -  Net 2402.13 ml     Exam: Heart:: Regular rate and rhythm or S1S2 present Lungs: normal and clear to auscultation Abdomen: soft, nontender, normal bowel sounds   Lab Results: CBC Latest Ref Rng & Units 10/17/2018 10/16/2018 10/16/2018  WBC 4.0 - 10.5 K/uL 6.5 - -  Hemoglobin 12.0 - 15.0 g/dL 10.9(L) 10.6(L) 10.2(L)  Hematocrit 36.0 - 46.0 % 35.7(L) 34.9(L) 33.4(L)  Platelets 150 - 400 K/uL 200 - -   CMP Latest Ref Rng & Units 10/17/2018 10/16/2018 10/15/2018  Glucose 70 - 99 mg/dL 119(H) 121(H) 112(H)  BUN 8 - 23 mg/dL 15 20 19   Creatinine 0.44 - 1.00 mg/dL 0.65 0.69 0.77  Sodium 135 - 145 mmol/L 140 142 140  Potassium 3.5 - 5.1 mmol/L 3.5 3.6 3.6  Chloride 98 - 111 mmol/L 104 104 103  CO2 22 - 32 mmol/L 28 28 26   Calcium 8.9 - 10.3 mg/dL 8.7(L) 9.0 8.6(L)  Total Protein 6.5 - 8.1 g/dL - - 7.3  Total Bilirubin 0.3 - 1.2 mg/dL - - 0.6  Alkaline Phos 38 - 126 U/L - - 94  AST 15 - 41 U/L - - 18  ALT 0 - 44 U/L - - 7   Micro Results: No results found for this or any previous visit (from the past 240 hour(s)). Studies/Results: No results found. Medications:  I have reviewed the patient's current  medications. Prior to Admission:  Medications Prior to Admission  Medication Sig Dispense Refill Last Dose  . famotidine (PEPCID) 20 MG tablet Take 1 tablet (20 mg total) by mouth 2 (two) times daily. (Patient not taking: Reported on 10/15/2018) 60 tablet 5 Not Taking at Unknown time  . ondansetron (ZOFRAN) 4 MG tablet Take 1 tablet (4 mg total) by mouth every 6 (six) hours as needed for nausea. (Patient not taking: Reported on 10/15/2018) 20 tablet 0 Completed Course at Unknown time   Scheduled: . docusate sodium  100 mg Oral BID  . hydrALAZINE  25 mg Oral Q8H  . mouth rinse  15 mL Mouth Rinse BID   Continuous: . dextrose 5 % and 0.45% NaCl 75 mL/hr at 10/17/18 1140   [facility]:[id number] **OR** acetaminophen, bisacodyl, hydrALAZINE, lip balm, ondansetron **OR** ondansetron (ZOFRAN) IV, traZODone Anti-infectives (From admission, onward)   None     Scheduled Meds: . docusate sodium  100 mg Oral BID  . hydrALAZINE  25 mg Oral Q8H  . mouth rinse  15 mL Mouth Rinse BID   Continuous Infusions: . dextrose 5 % and 0.45% NaCl 75 mL/hr at 10/17/18  1140   PRN Meds:.acetaminophen **OR** acetaminophen, bisacodyl, hydrALAZINE, lip balm, ondansetron **OR** ondansetron (ZOFRAN) IV, traZODone   Assessment: Active Problems:   Rectal bleeding  Resolved, Hb stable  Plan: Advance diet No plan for endoscopic intervention at this time GI will sign off, please call GI back with questions/concerns   LOS: 0 days   Taylor Johnson 10/17/2018, 12:00 PM

## 2018-10-18 DIAGNOSIS — K625 Hemorrhage of anus and rectum: Secondary | ICD-10-CM | POA: Diagnosis not present

## 2018-10-18 LAB — CBC
HCT: 33.5 % — ABNORMAL LOW (ref 36.0–46.0)
Hemoglobin: 10.1 g/dL — ABNORMAL LOW (ref 12.0–15.0)
MCH: 28 pg (ref 26.0–34.0)
MCHC: 30.1 g/dL (ref 30.0–36.0)
MCV: 92.8 fL (ref 80.0–100.0)
Platelets: 203 10*3/uL (ref 150–400)
RBC: 3.61 MIL/uL — ABNORMAL LOW (ref 3.87–5.11)
RDW: 15.3 % (ref 11.5–15.5)
WBC: 6 10*3/uL (ref 4.0–10.5)
nRBC: 0 % (ref 0.0–0.2)

## 2018-10-18 LAB — BASIC METABOLIC PANEL
Anion gap: 6 (ref 5–15)
BUN: 19 mg/dL (ref 8–23)
CO2: 28 mmol/L (ref 22–32)
Calcium: 8.7 mg/dL — ABNORMAL LOW (ref 8.9–10.3)
Chloride: 107 mmol/L (ref 98–111)
Creatinine, Ser: 0.96 mg/dL (ref 0.44–1.00)
GFR calc Af Amer: >60 mL/min (ref >60)
GFR calc non Af Amer: 60 mL/min — ABNORMAL LOW (ref >60)
Glucose, Bld: 121 mg/dL — ABNORMAL HIGH (ref 70–99)
Potassium: 3.8 mmol/L (ref 3.5–5.1)
Sodium: 141 mmol/L (ref 135–145)

## 2018-10-18 LAB — GLUCOSE, CAPILLARY
Glucose-Capillary: 97 mg/dL (ref 70–99)
Glucose-Capillary: 85 mg/dL (ref 70–99)

## 2018-10-18 MED ORDER — HYDRALAZINE HCL 25 MG PO TABS: 25.0000 mg | ORAL_TABLET | Freq: Three times a day (TID) | ORAL | 0 refills | Status: DC

## 2018-10-18 NOTE — Discharge Instructions (Signed)
Your blood pressures were high during this hospitalization. We started you on a blood pressure medicine called Hydralazine. Please take 1 tablet three times a day.  -Dr. Brett Albino

## 2018-10-18 NOTE — TOC Transition Note (Signed)
Transition of Care [facility]) - CM/SW Discharge Note   Patient Details  Name: Taylor Johnson MRN: [id number] Date of Birth: 1946-06-04  [facility]) CM/SW Contact:  Shelbie Ammons, RN Phone Number: 10/18/2018, 9:06 AM   Clinical Narrative:   Discharge to home today per Dr. Brett Albino. Hgb 10.1 Sees Dr. Carolin Coy. Family will transport    Final next level of care: Home/Self Care Barriers to Discharge: No Barriers Identified   Patient Goals and CMS Choice Patient states their goals for this hospitalization and ongoing recovery are:: (Wants to go home) CMS Medicare.gov Compare Post Acute Care list provided to:: (NA) Choice offered to / list presented to : NA  Discharge Placement Home                       Discharge Plan and Services No follow-up services recommended                DME Arranged: N/A DME Agency: NA HH Arranged: NA HH Agency: NA   Social Determinants of Health (SDOH) Interventions Has legal guardiant     Readmission Risk Interventions No flowsheet data found.

## 2018-10-18 NOTE — Discharge Summary (Signed)
[place] [facility] [place] NAME: Taylor Johnson    MR#:  [id number]  DATE OF BIRTH:  29-Sep-1945  DATE OF ADMISSION:  10/15/2018   ADMITTING PHYSICIAN: No admitting provider for patient encounter.  DATE OF DISCHARGE: No discharge date for patient encounter.  PRIMARY CARE PHYSICIAN: Taylor Hess, MD   ADMISSION DIAGNOSIS:  Hematochezia [K92.1] Rectal bleeding [K62.5] DISCHARGE DIAGNOSIS:  Active Problems:   Rectal bleeding  SECONDARY DIAGNOSIS:   Past Medical History:  Diagnosis Date  . Anxiety   . Anxiety, generalized 05/11/2015  . Asthma   . Cough 06/02/2016   Chronic - followed by Pulmonary  . Depression   . Developmental delay   . Diverticulitis   . Diverticulosis of colon 05/11/2015  . Dysphagia 10/15/2015   Routine Ba Swallow normal; rec modified barium swallow   . Hypoxia 09/12/2015  . Leg swelling   . Localized edema 04/15/2016  . Microscopic hematuria 04/15/2016  . OSA on CPAP 11/18/2015   CPAP @ 18 cm H2O started 02/2016  . Urge incontinence of urine 05/12/2015   HOSPITAL COURSE:   Taylor Johnson is a 73 year old female who presented to the ED with rectal bleeding, felt to be secondary to likely diverticulosis. She was seen by GI, who did not recommend endoscopy or colonoscopy. Her rectal bleeding resolved on its own. Her hemoglobin remained stable. She was discharged home with close PCP follow-up.  Patient also noted to have some high blood pressures this admission. Started on Hydralazine 25mg  tid and blood pressures improved.   DISCHARGE CONDITIONS:  GI bleed- resolved Hypertension CONSULTS OBTAINED:  Treatment Team:  Taylor Lame, MD DRUG ALLERGIES:  No Known Allergies DISCHARGE MEDICATIONS:   Allergies as of 10/18/2018   No Known Allergies     Medication List    STOP taking these medications   famotidine 20 MG tablet Commonly known as:  PEPCID   ondansetron 4 MG tablet Commonly known as:  ZOFRAN     TAKE these  medications   hydrALAZINE 25 MG tablet Commonly known as:  APRESOLINE Take 1 tablet (25 mg total) by mouth 3 (three) times daily.        DISCHARGE INSTRUCTIONS:  1. Needs hgb rechecked as an outpatient 2. Take hydralazine 25mg  tid 3. Recheck BP as an outpatient DIET:  Cardiac diet DISCHARGE CONDITION:  Stable ACTIVITY:  Activity as tolerated OXYGEN:  Home Oxygen: No.  Oxygen Delivery: room air DISCHARGE LOCATION:  home   If you experience worsening of your admission symptoms, develop shortness of breath, life threatening emergency, suicidal or homicidal thoughts you must seek medical attention immediately by calling 911 or calling your MD immediately  if symptoms less severe.  You Must read complete instructions/literature along with all the possible adverse reactions/side effects for all the Medicines you take and that have been prescribed to you. Take any new Medicines after you have completely understood and accpet all the possible adverse reactions/side effects.   Please note  You were cared for by a hospitalist during your hospital stay. If you have any questions about your discharge medications or the care you received while you were in the hospital after you are discharged, you can call the unit and asked to speak with the hospitalist on call if the hospitalist that took care of you is not available. Once you are discharged, your primary care physician will handle any further medical issues. Please note that NO REFILLS for any discharge medications  will be authorized once you are discharged, as it is imperative that you return to your primary care physician (or establish a relationship with a primary care physician if you do not have one) for your aftercare needs so that they can reassess your need for medications and monitor your lab values.    On the day of Discharge:  VITAL SIGNS:  Blood pressure (!) 148/64, pulse 65, temperature 97.9 F (36.6 C), temperature source  Oral, resp. rate 16, height 5\' 6"  (1.676 m), weight 117.2 kg, SpO2 98 %. PHYSICAL EXAMINATION:  GENERAL:  73 y.o.-year-old patient lying in the bed with no acute distress.  EYES: Pupils equal, round, reactive to light and accommodation. No scleral icterus. Extraocular muscles intact.  HEENT: Head atraumatic, normocephalic. Oropharynx and nasopharynx clear.  NECK:  Supple, no jugular venous distention. No thyroid enlargement, no tenderness.  LUNGS: Normal breath sounds bilaterally, no wheezing, rales,rhonchi or crepitation. No use of accessory muscles of respiration.  CARDIOVASCULAR: RRR, S1, S2 normal. No murmurs, rubs, or gallops.  ABDOMEN: Soft, non-tender, non-distended. Bowel sounds present. No organomegaly or mass.  EXTREMITIES: No pedal edema, cyanosis, or clubbing.  NEUROLOGIC: Cranial nerves II through XII are intact. Muscle strength 5/5 in all extremities. Sensation intact. Gait not checked.  PSYCHIATRIC: The patient is alert and oriented x 3.  SKIN: No obvious rash, lesion, or ulcer.  DATA REVIEW:   CBC Recent Labs  Lab 10/18/18 0332  WBC 6.0  HGB 10.1*  HCT 33.5*  PLT 203    Chemistries  Recent Labs  Lab 10/15/18 2017  10/18/18 0332  NA 140   < > 141  K 3.6   < > 3.8  CL 103   < > 107  CO2 26   < > 28  GLUCOSE 112*   < > 121*  BUN 19   < > 19  CREATININE 0.77   < > 0.96  CALCIUM 8.6*   < > 8.7*  AST 18  --   --   ALT 7  --   --   ALKPHOS 94  --   --   BILITOT 0.6  --   --    < > = values in this interval not displayed.     Microbiology Results  Results for orders placed or performed during the hospital encounter of 12/11/16  Urine culture     Status: Abnormal   Collection Time: 12/11/16 12:52 AM  Result Value Ref Range Status   Specimen Description URINE, RANDOM  Final   Special Requests NONE  Final   Culture MULTIPLE SPECIES PRESENT, SUGGEST RECOLLECTION (A)  Final   Report Status 12/12/2016 FINAL  Final    RADIOLOGY:  No results  found.   Management plans discussed with the patient, family and they are in agreement.  CODE STATUS: Full Code   TOTAL TIME TAKING CARE OF THIS PATIENT: 45 minutes.    Taylor Johnson M.D on 10/18/2018 at 8:10 AM  Between 7am to 6pm - Pager - [phone]  After 6pm go to [email or web address] - Proofreader  [facility]  Office  [phone]  CC: Primary care physician; Taylor Hess, MD   Note: This dictation was prepared with Dragon dictation along with smaller phrase technology. Any transcriptional errors that result from this process are unintentional.

## 2018-10-23 ENCOUNTER — Other Ambulatory Visit: Payer: Self-pay

## 2018-10-23 ENCOUNTER — Ambulatory Visit (INDEPENDENT_AMBULATORY_CARE_PROVIDER_SITE_OTHER): Payer: Medicare Other | Admitting: Internal Medicine

## 2018-10-23 ENCOUNTER — Encounter: Payer: Self-pay | Admitting: Internal Medicine

## 2018-10-23 VITALS — BP 118/70 | HR 63 | Ht 66.0 in | Wt 253.0 lb

## 2018-10-23 DIAGNOSIS — R030 Elevated blood-pressure reading, without diagnosis of hypertension: Secondary | ICD-10-CM | POA: Diagnosis not present

## 2018-10-23 DIAGNOSIS — I10 Essential (primary) hypertension: Secondary | ICD-10-CM | POA: Insufficient documentation

## 2018-10-23 DIAGNOSIS — K625 Hemorrhage of anus and rectum: Secondary | ICD-10-CM | POA: Diagnosis not present

## 2018-10-23 MED ORDER — HYDRALAZINE HCL 25 MG PO TABS: 25.0000 mg | ORAL_TABLET | Freq: Three times a day (TID) | ORAL | 5 refills | Status: DC

## 2018-10-23 NOTE — Progress Notes (Signed)
Date:  10/23/2018   Name:  Taylor Johnson   DOB:  1945/11/12   MRN:  [id number]   Chief Complaint: Hypertension (F/up from hospital. ) and Rectal Bleeding Admitted to [facility] 10/15/18. Patient presented to the ED with rectal bleeding, felt to be secondary to likely diverticulosis. She was seen by GI, who did not recommend endoscopy or colonoscopy. Her rectal bleeding resolved on its own. Her hemoglobin remained stable. She was discharged home with close PCP follow-up. Patient also noted to have some high blood pressures this admission. Started on Hydralazine 25mg  tid and blood pressures improved.  Rectal Bleeding   The current episode started more than 1 week ago. The onset was sudden (after moving furniture). The problem has been resolved. The patient is experiencing no pain. Pertinent negatives include no fever, no diarrhea, no rectal pain, no chest pain and no headaches.  Hypertension  This is a new problem. The current episode started in the past 7 days (noted during hospital stay). Pertinent negatives include no chest pain, headaches or shortness of breath. Treatments tried: started on hydralazine.   Lab Results  Component Value Date   WBC 6.0 10/18/2018   HGB 10.1 (L) 10/18/2018   HCT 33.5 (L) 10/18/2018   MCV 92.8 10/18/2018   PLT 203 10/18/2018     Review of Systems  Constitutional: Negative for chills and fever.  Respiratory: Negative for chest tightness, shortness of breath and wheezing.   Cardiovascular: Negative for chest pain and leg swelling.  Gastrointestinal: Positive for hematochezia. Negative for constipation, diarrhea and rectal pain.  Genitourinary: Negative for difficulty urinating, dysuria and urgency.  Neurological: Positive for dizziness. Negative for headaches.  Psychiatric/Behavioral: Negative for dysphoric mood and sleep disturbance.    Patient Active Problem List   Diagnosis Date Noted  . Rectal bleeding 10/15/2018  . Functional systolic murmur 62/95/2841   . PAT (paroxysmal atrial tachycardia) ([place]) 03/21/2017  . Orthostatic hypotension 03/16/2017  . Developmental delay disorder 02/06/2017  . Nausea and vomiting 01/29/2017  . Abdominal pain 01/27/2017  . Calculus of gallbladder without cholecystitis without obstruction 01/20/2017  . Cough 06/02/2016  . Localized edema 04/15/2016  . Microscopic hematuria 04/15/2016  . OSA on CPAP 11/18/2015  . Dysphagia 10/15/2015  . Hypoxia 09/12/2015  . Urge incontinence of urine 05/12/2015  . Diverticulosis of colon 05/11/2015  . Anxiety, generalized 05/11/2015    No Known Allergies  Past Surgical History:  Procedure Laterality Date  . ABDOMINAL HYSTERECTOMY    . BREAST BIOPSY Left    neg  . COLON SURGERY    . ESOPHAGOGASTRODUODENOSCOPY (EGD) WITH PROPOFOL N/A 01/29/2017   Procedure: ESOPHAGOGASTRODUODENOSCOPY (EGD) WITH PROPOFOL;  Surgeon: Wilford Corner, MD;  Location: [facility] ENDOSCOPY;  Service: Endoscopy;  Laterality: N/A;    Social History   Tobacco Use  . Smoking status: Never Smoker  . Smokeless tobacco: Never Used  . Tobacco comment: smoking cessation materials not required  Substance Use Topics  . Alcohol use: No    Alcohol/week: 0.0 standard drinks  . Drug use: No     Medication list has been reviewed and updated.  Current Meds  Medication Sig  . hydrALAZINE (APRESOLINE) 25 MG tablet Take 1 tablet (25 mg total) by mouth 3 (three) times daily.  . vitamin B-12 (CYANOCOBALAMIN) 500 MCG tablet Take 500 mcg by mouth daily.    PHQ 2/9 Scores 10/23/2018 03/05/2018 06/02/2016 05/12/2015  PHQ - 2 Score 0 2 0 2  PHQ- 9 Score - 6 - 4  BP Readings from Last 3 Encounters:  10/23/18 118/70  10/18/18 (!) 148/64  03/05/18 118/78    Physical Exam Vitals signs and nursing note reviewed.  Eyes:     Pupils: Pupils are equal, round, and reactive to light.  Neck:     Musculoskeletal: Normal range of motion and neck supple.  Cardiovascular:     Rate and Rhythm: Normal rate and  regular rhythm.     Pulses: Normal pulses.  Pulmonary:     Effort: Pulmonary effort is normal.     Breath sounds: Normal breath sounds. No wheezing or rales.  Abdominal:     General: Bowel sounds are normal.     Tenderness: There is no abdominal tenderness. There is no guarding.  Musculoskeletal:        General: No swelling.     Right lower leg: No edema.     Left lower leg: No edema.  Skin:    General: Skin is warm.     Wt Readings from Last 3 Encounters:  10/23/18 253 lb (114.8 kg)  10/15/18 258 lb 4.8 oz (117.2 kg)  03/05/18 248 lb (112.5 kg)    BP 118/70   Pulse 63   Ht 5\' 6"  (1.676 m)   Wt 253 lb (114.8 kg)   SpO2 98%   BMI 40.84 kg/m   Assessment and Plan: 1. Rectal bleeding Resolved, call if recurrent - CBC with Differential/Platelet  2. Elevated blood pressure reading Continue hydralazine Recheck 4 months   Partially dictated using Editor, commissioning. Any errors are unintentional.  Halina Maidens, MD [facility]  10/23/2018

## 2018-10-23 NOTE — Patient Instructions (Addendum)
This information is directly available on the CDC website: RunningShows.co.za.html    Source:CDC Reference to specific commercial products, manufacturers, companies, or trademarks does not constitute its endorsement or recommendation by the [place], [place], or Centers for [employer] and Prevention. This information is directly available on the CDC website: RunningShows.co.za.html    Source:CDC Reference to specific commercial products, manufacturers, companies, or trademarks does not constitute its endorsement or recommendation by the [place], [place], or Centers for [employer] and Prevention.

## 2018-10-24 LAB — CBC WITH DIFFERENTIAL/PLATELET
Basophils Absolute: 0 10*3/uL (ref 0.0–0.2)
Basos: 0 %
EOS (ABSOLUTE): 0.2 10*3/uL (ref 0.0–0.4)
Eos: 4 %
Hematocrit: 28 % — ABNORMAL LOW (ref 34.0–46.6)
Hemoglobin: 8.9 g/dL — ABNORMAL LOW (ref 11.1–15.9)
Immature Grans (Abs): 0 10*3/uL (ref 0.0–0.1)
Immature Granulocytes: 0 %
Lymphocytes Absolute: 1.5 10*3/uL (ref 0.7–3.1)
Lymphs: 33 %
MCH: 28 pg (ref 26.6–33.0)
MCHC: 31.8 g/dL (ref 31.5–35.7)
MCV: 88 fL (ref 79–97)
Monocytes Absolute: 0.5 10*3/uL (ref 0.1–0.9)
Monocytes: 12 %
Neutrophils Absolute: 2.4 10*3/uL (ref 1.4–7.0)
Neutrophils: 51 %
Platelets: 146 10*3/uL — ABNORMAL LOW (ref 150–450)
RBC: 3.18 x10E6/uL — ABNORMAL LOW (ref 3.77–5.28)
RDW: 13.9 % (ref 11.7–15.4)
WBC: 4.6 10*3/uL (ref 3.4–10.8)

## 2018-10-29 ENCOUNTER — Other Ambulatory Visit: Payer: Self-pay | Admitting: Internal Medicine

## 2018-10-29 DIAGNOSIS — K625 Hemorrhage of anus and rectum: Secondary | ICD-10-CM

## 2018-11-05 ENCOUNTER — Ambulatory Visit (INDEPENDENT_AMBULATORY_CARE_PROVIDER_SITE_OTHER): Payer: Medicare Other | Admitting: Gastroenterology

## 2018-11-05 ENCOUNTER — Telehealth: Payer: Self-pay | Admitting: Gastroenterology

## 2018-11-05 DIAGNOSIS — K625 Hemorrhage of anus and rectum: Secondary | ICD-10-CM

## 2018-11-05 DIAGNOSIS — R1310 Dysphagia, unspecified: Secondary | ICD-10-CM

## 2018-11-05 DIAGNOSIS — D649 Anemia, unspecified: Secondary | ICD-10-CM

## 2018-11-05 MED ORDER — OMEPRAZOLE 40 MG PO CPDR: 40.0000 mg | DELAYED_RELEASE_CAPSULE | Freq: Every day | ORAL | 0 refills | Status: DC

## 2018-11-05 NOTE — Telephone Encounter (Signed)
I called Taylor Johnson to schedule  The patient a 4wk return appointment with Dr Marius Ditch.

## 2018-11-05 NOTE — Progress Notes (Signed)
Taylor Sear, MD [address]  [place]  [place], [place] [address]  Main: [phone]  Fax: [phone]    Gastroenterology Consultation Tele Visit  Referring Provider:     [name], MD Primary Care Physician:  [name], MD Primary Gastroenterologist:  Dr. [name] Reason for Consultation: Worsening anemia, hospital follow-up        HPI:   Taylor Johnson is a 73 y.o. female referred by Dr. Army Melia, Jesse Sans, MD  for consultation & management of recent hospitalization for painless rectal bleeding.  Virtual Visit via Telephone Note  I connected with Taylor Johnson on 11/05/18 at 10:30 AM EDT by telephone and verified that I am speaking with the correct person using two identifiers.   I discussed the limitations, risks, security and privacy concerns of performing an evaluation and management service by telephone and the availability of in person appointments. I also discussed with the patient that there may be a patient responsible charge related to this service. The patient expressed understanding and agreed to proceed.  Location of the Patient: Home  Location of the provider: Home office  Persons participating in the visit: Patient's sister had increased, patient, me   History of Present Illness: I initially met Taylor Johnson in the hospital when she was admitted to [facility] on 10/16/2018 secondary to painless rectal bleeding with resulting anemia.  Patient has history of colonic diverticulosis, ?history of segmental resection of the colon for diverticulitis as reported by the patient.  Her hemoglobin during admission was 11.6, dropped to 10.1 on 10/18/2018, day of discharge.  She followed up with her PCP, her hemoglobin was further down to 8.9.  Patient was also feeling tired.  Therefore her PCP referred to me for the management of anemia.  Apparently, patient did not have any further episodes of rectal bleeding since discharge.  She reports having regular  bowel movements, denies melena, hematochezia.  She does report sensation of food stuck in her throat particularly when she tries to swallow large pieces without chewing properly.  Her sister who also participated during this phone visit told me that she has missing teeth and does not have dentures, she does experience choking on food when she does not chew well.  She denies epigastric pain but reports mild periumbilical discomfort.  She had a CT abdomen pelvis with contrast in 01/2018 did not reveal any GI pathology  Patient does not drink alcohol or use tobacco  NSAIDs: None  Antiplts/Anticoagulants/Anti thrombotics: None  GI Procedures:  EGD 01/2017, normal - Normal esophagus. - Z-line regular, 42 cm from the incisors. - Normal stomach. - Normal examined duodenum. - No specimens collected.  Colonoscopy 04/2014 for colon cancer screening Good prep Pancolonic diverticulosis  Past Medical History:  Diagnosis Date  . Abdominal pain 01/27/2017  . Anxiety   . Anxiety, generalized 05/11/2015  . Asthma   . Cough 06/02/2016   Chronic - followed by Pulmonary  . Depression   . Developmental delay   . Diverticulitis   . Diverticulosis of colon 05/11/2015  . Dysphagia 10/15/2015   Routine Ba Swallow normal; rec modified barium swallow   . Hypoxia 09/12/2015  . Leg swelling   . Localized edema 04/15/2016  . Microscopic hematuria 04/15/2016  . Nausea and vomiting 01/29/2017  . Orthostatic hypotension 03/16/2017  . OSA on CPAP 11/18/2015   CPAP @ 18 cm H2O started 02/2016  . Rectal bleeding 10/15/2018  . Urge incontinence of urine 05/12/2015  Past Surgical History:  Procedure Laterality Date  . ABDOMINAL HYSTERECTOMY    . BREAST BIOPSY Left    neg  . COLON SURGERY    . ESOPHAGOGASTRODUODENOSCOPY (EGD) WITH PROPOFOL N/A 01/29/2017   Procedure: ESOPHAGOGASTRODUODENOSCOPY (EGD) WITH PROPOFOL;  Surgeon: Wilford Corner, MD;  Location[facility] ENDOSCOPY;  Service: Endoscopy;  Laterality: N/A;     Current Outpatient Medications:  .  hydrALAZINE (APRESOLINE) 25 MG tablet, Take 1 tablet (25 mg total) by mouth 3 (three) times daily., Disp: 90 tablet, Rfl: 5 .  vitamin B-12 (CYANOCOBALAMIN) 500 MCG tablet, Take 500 mcg by mouth daily., Disp: , Rfl:  .  omeprazole (PRILOSEC) 40 MG capsule, Take 1 capsule (40 mg total) by mouth daily before breakfast for 30 days., Disp: 30 capsule, Rfl: 0   Family History  Problem Relation Age of Onset  . Hypertension Mother   . Kidney disease Mother   . Dementia Mother   . Cancer Father        lung  . Bladder Cancer Neg Hx   . Breast cancer Neg Hx      Social History   Tobacco Use  . Smoking status: Never Smoker  . Smokeless tobacco: Never Used  . Tobacco comment: smoking cessation materials not required  Substance Use Topics  . Alcohol use: No    Alcohol/week: 0.0 standard drinks  . Drug use: No    Allergies as of 11/05/2018  . (No Known Allergies)     Imaging Studies: Reviewed  Assessment and Plan:   Taylor Johnson is a 73 y.o. female with history of pancolonic diverticulosis, hypertension with recent hospital admission for painless rectal bleeding, acute anemia.  Patient had further drop in hemoglobin since discharge to 8.9, MCV normal, mild thrombocytopenia.  Patient does not have any evidence of cirrhosis.  She is not having active bleeding at this time.  Recommend to recheck CBC, iron studies today, if abnormal she will need further evaluation.  She also had 1 cm hypodensity in her upper pole of left kidney based on CT in 01/2018 and follow-up dedicated abdominal MRI was recommended.  She should follow-up with PCP about abdominal MRI for renal lesion Start omeprazole 40 mg daily for possible heartburn H. pylori breath test   Follow Up Instructions:   I discussed the assessment and treatment plan with the patient. The patient was provided an opportunity to ask questions and all were answered. The patient agreed with the plan and  demonstrated an understanding of the instructions.   The patient was advised to call back or seek an in-person evaluation if the symptoms worsen or if the condition fails to improve as anticipated.  I provided 20 minutes of non-face-to-face time during this encounter.   Follow up in 4 weeks   [name], MD

## 2018-11-06 LAB — FERRITIN: Ferritin: 70 ng/mL (ref 15–150)

## 2018-11-06 LAB — IRON AND TIBC
Iron Saturation: 16 % (ref 15–55)
Iron: 39 ug/dL (ref 27–139)
Total Iron Binding Capacity: 241 ug/dL — ABNORMAL LOW (ref 250–450)
UIBC: 202 ug/dL (ref 118–369)

## 2018-11-06 LAB — CBC
Hematocrit: 34.7 % (ref 34.0–46.6)
Hemoglobin: 10.5 g/dL — ABNORMAL LOW (ref 11.1–15.9)
MCH: 27.6 pg (ref 26.6–33.0)
MCHC: 30.3 g/dL — ABNORMAL LOW (ref 31.5–35.7)
MCV: 91 fL (ref 79–97)
Platelets: 239 10*3/uL (ref 150–450)
RBC: 3.8 x10E6/uL (ref 3.77–5.28)
RDW: 14.3 % (ref 11.7–15.4)
WBC: 5.7 10*3/uL (ref 3.4–10.8)

## 2018-11-06 LAB — H. PYLORI BREATH TEST: H pylori Breath Test: POSITIVE — AB

## 2018-11-06 LAB — B12 AND FOLATE PANEL
Folate: 4.7 ng/mL (ref >3.0)
Vitamin B-12: 523 pg/mL (ref 232–1245)

## 2018-12-03 ENCOUNTER — Ambulatory Visit (INDEPENDENT_AMBULATORY_CARE_PROVIDER_SITE_OTHER): Payer: Medicare Other | Admitting: Gastroenterology

## 2018-12-03 DIAGNOSIS — R1310 Dysphagia, unspecified: Secondary | ICD-10-CM | POA: Diagnosis not present

## 2018-12-03 DIAGNOSIS — D649 Anemia, unspecified: Secondary | ICD-10-CM | POA: Diagnosis not present

## 2018-12-03 DIAGNOSIS — A048 Other specified bacterial intestinal infections: Secondary | ICD-10-CM

## 2018-12-03 MED ORDER — OMEPRAZOLE 40 MG PO CPDR: 40.0000 mg | DELAYED_RELEASE_CAPSULE | Freq: Two times a day (BID) | ORAL | 0 refills | Status: DC

## 2018-12-03 MED ORDER — AMOXICILLIN 500 MG PO TABS: 500.0000 mg | ORAL_TABLET | Freq: Two times a day (BID) | ORAL | 0 refills | Status: AC

## 2018-12-03 MED ORDER — CLARITHROMYCIN 500 MG PO TABS: 500.0000 mg | ORAL_TABLET | Freq: Two times a day (BID) | ORAL | 0 refills | Status: AC

## 2018-12-03 NOTE — Progress Notes (Signed)
Taylor Sear, MD [address]  [place]  [place], [place] [address]  Main: [phone]  Fax: [phone]    Gastroenterology Consultation Tele Visit  Referring Provider:     [name], MD Primary Care Physician:  [name], MD Primary Gastroenterologist:  Dr. [name] Reason for Consultation: Worsening anemia, hospital follow-up        HPI:   Taylor Johnson is a 73 y.o. female referred by Dr. Army Melia, Jesse Sans, MD  for consultation & management of recent hospitalization for painless rectal bleeding.  Virtual Visit via Telephone Note  I connected with Taylor Johnson on 12/03/18 at  8:30 AM EDT by telephone and verified that I am speaking with the correct person using two identifiers.   I discussed the limitations, risks, security and privacy concerns of performing an evaluation and management service by telephone and the availability of in person appointments. I also discussed with the patient that there may be a patient responsible charge related to this service. The patient expressed understanding and agreed to proceed.  Location of the Patient: Home  Location of the provider: Home office  Persons participating in the visit: Patient's sister [name], patient, me   History of Present Illness: I initially met Taylor Johnson in the hospital when she was admitted to [facility] on 10/16/2018 secondary to painless rectal bleeding with resulting anemia.  Patient has history of colonic diverticulosis, ?history of segmental resection of the colon for diverticulitis as reported by the patient.  Her hemoglobin during admission was 11.6, dropped to 10.1 on 10/18/2018, day of discharge.  She followed up with her PCP, her hemoglobin was further down to 8.9.  Patient was also feeling tired.  Therefore her PCP referred to me for the management of anemia.  Apparently, patient did not have any further episodes of rectal bleeding since discharge.  She reports having regular  bowel movements, denies melena, hematochezia.  She does report sensation of food stuck in her throat particularly when she tries to swallow large pieces without chewing properly.  Her sister who also participated during this phone visit told me that she has missing teeth and does not have dentures, she does experience choking on food when she does not chew well.  She denies epigastric pain but reports mild periumbilical discomfort.  She had a CT abdomen pelvis with contrast in 01/2018 did not reveal any GI pathology  Patient does not drink alcohol or use tobacco  Follow-up visit 12/03/2018 Patient has been doing well.  Patient's sister provided the history.  Patient lives with her sister.  She does not have any concerns today.  Work-up of anemia revealed improving hemoglobin and normal B12, folate and iron panel.  H. pylori breath test returned positive.  She is taking omeprazole 40 mg once a day at present.  She denies any difficulty swallowing.  She denies any other GI symptoms at this time.  NSAIDs: None  Antiplts/Anticoagulants/Anti thrombotics: None  GI Procedures:  EGD 01/2017, normal - Normal esophagus. - Z-line regular, 42 cm from the incisors. - Normal stomach. - Normal examined duodenum. - No specimens collected.  Colonoscopy 04/2014 for colon cancer screening Good prep Pancolonic diverticulosis  Past Medical History:  Diagnosis Date  . Abdominal pain 01/27/2017  . Anxiety   . Anxiety, generalized 05/11/2015  . Asthma   . Cough 06/02/2016   Chronic - followed by Pulmonary  . Depression   . Developmental delay   . Diverticulitis   .  Diverticulosis of colon 05/11/2015  . Dysphagia 10/15/2015   Routine Ba Swallow normal; rec modified barium swallow   . Hypoxia 09/12/2015  . Leg swelling   . Localized edema 04/15/2016  . Microscopic hematuria 04/15/2016  . Nausea and vomiting 01/29/2017  . Orthostatic hypotension 03/16/2017  . OSA on CPAP 11/18/2015   CPAP @ 18 cm H2O started  02/2016  . Rectal bleeding 10/15/2018  . Urge incontinence of urine 05/12/2015    Past Surgical History:  Procedure Laterality Date  . ABDOMINAL HYSTERECTOMY    . BREAST BIOPSY Left    neg  . COLON SURGERY    . ESOPHAGOGASTRODUODENOSCOPY (EGD) WITH PROPOFOL N/A 01/29/2017   Procedure: ESOPHAGOGASTRODUODENOSCOPY (EGD) WITH PROPOFOL;  Surgeon: Wilford Corner, MD;  Location: [facility] ENDOSCOPY;  Service: Endoscopy;  Laterality: N/A;    Current Outpatient Medications:  .  hydrALAZINE (APRESOLINE) 25 MG tablet, Take 1 tablet (25 mg total) by mouth 3 (three) times daily., Disp: 90 tablet, Rfl: 5 .  omeprazole (PRILOSEC) 40 MG capsule, Take 1 capsule (40 mg total) by mouth daily before breakfast for 30 days., Disp: 30 capsule, Rfl: 0 .  vitamin B-12 (CYANOCOBALAMIN) 500 MCG tablet, Take 500 mcg by mouth daily., Disp: , Rfl:    Family History  Problem Relation Age of Onset  . Hypertension Mother   . Kidney disease Mother   . Dementia Mother   . Cancer Father        lung  . Bladder Cancer Neg Hx   . Breast cancer Neg Hx      Social History   Tobacco Use  . Smoking status: Never Smoker  . Smokeless tobacco: Never Used  . Tobacco comment: smoking cessation materials not required  Substance Use Topics  . Alcohol use: No    Alcohol/week: 0.0 standard drinks  . Drug use: No    Allergies as of 12/03/2018  . (No Known Allergies)     Imaging Studies: Reviewed  Assessment and Plan:   Taylor Johnson is a 73 y.o. female with history of pancolonic diverticulosis, hypertension with recent hospital admission for painless rectal bleeding, acute anemia.  Patient had further drop in hemoglobin since discharge to 8.9, MCV normal, mild thrombocytopenia.  Patient does not have any evidence of cirrhosis.  She is not having active bleeding at this time.  Her anemia is improving.  H. pylori breath test returned positive  Recommend triple therapy Recheck CBC, repeat H. pylori breath test in 1  month after completion of treatment for H. pylori   Follow Up Instructions:   I discussed the assessment and treatment plan with the patient. The patient was provided an opportunity to ask questions and all were answered. The patient agreed with the plan and demonstrated an understanding of the instructions.   The patient was advised to call back or seek an in-person evaluation if the symptoms worsen or if the condition fails to improve as anticipated.  I provided 15 minutes of non-face-to-face time during this encounter.   Follow up as needed   [name], MD

## 2018-12-17 ENCOUNTER — Other Ambulatory Visit: Payer: Self-pay

## 2018-12-17 DIAGNOSIS — K625 Hemorrhage of anus and rectum: Secondary | ICD-10-CM

## 2018-12-17 DIAGNOSIS — A048 Other specified bacterial intestinal infections: Secondary | ICD-10-CM

## 2018-12-17 DIAGNOSIS — D649 Anemia, unspecified: Secondary | ICD-10-CM

## 2018-12-18 LAB — CBC
Hematocrit: 38.4 % (ref 34.0–46.6)
Hemoglobin: 11.8 g/dL (ref 11.1–15.9)
MCH: 27.4 pg (ref 26.6–33.0)
MCHC: 30.7 g/dL — ABNORMAL LOW (ref 31.5–35.7)
MCV: 89 fL (ref 79–97)
Platelets: 260 10*3/uL (ref 150–450)
RBC: 4.31 x10E6/uL (ref 3.77–5.28)
RDW: 14 % (ref 11.7–15.4)
WBC: 7.1 10*3/uL (ref 3.4–10.8)

## 2018-12-24 ENCOUNTER — Other Ambulatory Visit: Payer: Self-pay

## 2018-12-24 ENCOUNTER — Ambulatory Visit (INDEPENDENT_AMBULATORY_CARE_PROVIDER_SITE_OTHER): Payer: Medicare Other | Admitting: Gastroenterology

## 2018-12-24 DIAGNOSIS — A048 Other specified bacterial intestinal infections: Secondary | ICD-10-CM

## 2018-12-24 DIAGNOSIS — K625 Hemorrhage of anus and rectum: Secondary | ICD-10-CM | POA: Diagnosis not present

## 2018-12-24 NOTE — Progress Notes (Signed)
[name], MD [address]  [place]  [place], [place] [address]  Main: [phone]  Fax: [phone]    Gastroenterology Consultation Tele Visit  Referring Provider:     [name], MD Primary Care Physician:  [name], MD Primary Gastroenterologist:  Dr. [name] Reason for Consultation:     Rectal bleeding        HPI:   Taylor Johnson is a 73 y.o. female referred by Dr. Army Melia, Jesse Sans, MD  for consultation & management of rectal bleeding  Virtual Visit via Telephone Note  I connected with Taylor Johnson on 12/24/18 at  9:00 AM EDT by telephone and verified that I am speaking with the correct person using two identifiers.   I discussed the limitations, risks, security and privacy concerns of performing an evaluation and management service by telephone and the availability of in person appointments. I also discussed with the patient that there may be a patient responsible charge related to this service. The patient expressed understanding and agreed to proceed.  Location of the Patient: Home  Location of the provider: Office  Persons participating in the visit: Patient sister, patient and provider  History of Present Illness: Taylor Johnson had episode of rectal bleeding last week and her sister was very concerned about it.  She described it as bright red blood per rectum.  Repeat CBC revealed normal hemoglobin.  Patient's sister reported that rectal bleeding subsided and she does not have any abdominal symptoms.  Patient is about to finish triple therapy for H. pylori infection    Past Medical History:  Diagnosis Date  . Abdominal pain 01/27/2017  . Anxiety   . Anxiety, generalized 05/11/2015  . Asthma   . Cough 06/02/2016   Chronic - followed by Pulmonary  . Depression   . Developmental delay   . Diverticulitis   . Diverticulosis of colon 05/11/2015  . Dysphagia 10/15/2015   Routine Ba Swallow normal; rec modified barium swallow   .  Hypoxia 09/12/2015  . Leg swelling   . Localized edema 04/15/2016  . Microscopic hematuria 04/15/2016  . Nausea and vomiting 01/29/2017  . Orthostatic hypotension 03/16/2017  . OSA on CPAP 11/18/2015   CPAP @ 18 cm H2O started 02/2016  . Rectal bleeding 10/15/2018  . Urge incontinence of urine 05/12/2015    Past Surgical History:  Procedure Laterality Date  . ABDOMINAL HYSTERECTOMY    . BREAST BIOPSY Left    neg  . COLON SURGERY    . ESOPHAGOGASTRODUODENOSCOPY (EGD) WITH PROPOFOL N/A 01/29/2017   Procedure: ESOPHAGOGASTRODUODENOSCOPY (EGD) WITH PROPOFOL;  Surgeon: Wilford Corner, MD;  Location: [facility] ENDOSCOPY;  Service: Endoscopy;  Laterality: N/A;    Prior to Admission medications   Medication Sig Start Date End Date Taking? Authorizing Provider  amoxicillin (AMOXIL) 500 MG capsule TAKE ONE TABLET BY MOUTH TWICE DAILY FOR FOURTEEN DAYS 12/03/18  Yes [provider]  hydrALAZINE (APRESOLINE) 25 MG tablet Take 1 tablet (25 mg total) by mouth 3 (three) times daily. 10/23/18  Yes [name], MD  vitamin B-12 (CYANOCOBALAMIN) 500 MCG tablet Take 500 mcg by mouth daily.   Yes [provider]  omeprazole (PRILOSEC) 40 MG capsule Take 1 capsule (40 mg total) by mouth 2 (two) times daily before a meal for 14 days. 12/03/18 12/17/18  Lin Landsman, MD    Family History  Problem Relation Age of Onset  . Hypertension Mother   . Kidney disease  Mother   . Dementia Mother   . Cancer Father        lung  . Bladder Cancer Neg Hx   . Breast cancer Neg Hx      Social History   Tobacco Use  . Smoking status: Never Smoker  . Smokeless tobacco: Never Used  . Tobacco comment: smoking cessation materials not required  Substance Use Topics  . Alcohol use: No    Alcohol/week: 0.0 standard drinks  . Drug use: No    Allergies as of 12/24/2018  . (No Known Allergies)     Imaging Studies: Reviewed  Assessment and Plan:   Taylor Johnson is a 73 y.o. female with  history of pancolonic diverticulosis, hypertension with recent hospital admission for painless rectal bleeding, acute anemia.  Patient had further drop in hemoglobin since discharge to 8.9, MCV normal, mild thrombocytopenia.  Patient does not have any evidence of cirrhosis. She is not having active bleeding at this time.  Her anemia resolved.  H. pylori breath test returned positive,  On triple therapy  Repeat H. pylori breath test in 1 month  Follow Up Instructions:   I discussed the assessment and treatment plan with the patient. The patient was provided an opportunity to ask questions and all were answered. The patient agreed with the plan and demonstrated an understanding of the instructions.   The patient was advised to call back or seek an in-person evaluation if the symptoms worsen or if the condition fails to improve as anticipated.  I provided 10 minutes of non-face-to-face time during this encounter.   Follow up as needed   [name], MD

## 2019-01-13 ENCOUNTER — Inpatient Hospital Stay
Admission: EM | Admit: 2019-01-13 | Discharge: 2019-01-16 | DRG: 392 | Disposition: A | Discharge status: Home Health | Payer: Medicare Other | Attending: Internal Medicine | Admitting: Internal Medicine

## 2019-01-13 ENCOUNTER — Other Ambulatory Visit: Payer: Self-pay

## 2019-01-13 ENCOUNTER — Emergency Department: Payer: Medicare Other

## 2019-01-13 DIAGNOSIS — Z79899 Other long term (current) drug therapy: Secondary | ICD-10-CM

## 2019-01-13 DIAGNOSIS — R1084 Generalized abdominal pain: Secondary | ICD-10-CM | POA: Diagnosis not present

## 2019-01-13 DIAGNOSIS — I10 Essential (primary) hypertension: Secondary | ICD-10-CM | POA: Diagnosis present

## 2019-01-13 DIAGNOSIS — R6250 Unspecified lack of expected normal physiological development in childhood: Secondary | ICD-10-CM | POA: Diagnosis present

## 2019-01-13 DIAGNOSIS — Z20828 Contact with and (suspected) exposure to other viral communicable diseases: Secondary | ICD-10-CM | POA: Diagnosis present

## 2019-01-13 DIAGNOSIS — G4733 Obstructive sleep apnea (adult) (pediatric): Secondary | ICD-10-CM | POA: Diagnosis present

## 2019-01-13 DIAGNOSIS — Z6841 Body Mass Index (BMI) 40.0 and over, adult: Secondary | ICD-10-CM

## 2019-01-13 DIAGNOSIS — R001 Bradycardia, unspecified: Secondary | ICD-10-CM

## 2019-01-13 DIAGNOSIS — K219 Gastro-esophageal reflux disease without esophagitis: Principal | ICD-10-CM | POA: Diagnosis present

## 2019-01-13 DIAGNOSIS — K5730 Diverticulosis of large intestine without perforation or abscess without bleeding: Secondary | ICD-10-CM | POA: Diagnosis present

## 2019-01-13 DIAGNOSIS — R112 Nausea with vomiting, unspecified: Secondary | ICD-10-CM

## 2019-01-13 DIAGNOSIS — Z8719 Personal history of other diseases of the digestive system: Secondary | ICD-10-CM

## 2019-01-13 DIAGNOSIS — R42 Dizziness and giddiness: Secondary | ICD-10-CM

## 2019-01-13 DIAGNOSIS — E876 Hypokalemia: Secondary | ICD-10-CM | POA: Diagnosis present

## 2019-01-13 DIAGNOSIS — R531 Weakness: Secondary | ICD-10-CM

## 2019-01-13 DIAGNOSIS — E669 Obesity, unspecified: Secondary | ICD-10-CM | POA: Diagnosis present

## 2019-01-13 LAB — CBC
HCT: 36.8 % (ref 36.0–46.0)
Hemoglobin: 11 g/dL — ABNORMAL LOW (ref 12.0–15.0)
MCH: 27.2 pg (ref 26.0–34.0)
MCHC: 29.9 g/dL — ABNORMAL LOW (ref 30.0–36.0)
MCV: 90.9 fL (ref 80.0–100.0)
Platelets: 234 10*3/uL (ref 150–400)
RBC: 4.05 MIL/uL (ref 3.87–5.11)
RDW: 16.2 % — ABNORMAL HIGH (ref 11.5–15.5)
WBC: 5 10*3/uL (ref 4.0–10.5)
nRBC: 0 % (ref 0.0–0.2)

## 2019-01-13 LAB — COMPREHENSIVE METABOLIC PANEL
ALT: 9 U/L (ref 0–44)
AST: 13 U/L — ABNORMAL LOW (ref 15–41)
Albumin: 4 g/dL (ref 3.5–5.0)
Alkaline Phosphatase: 105 U/L (ref 38–126)
Anion gap: 9 (ref 5–15)
BUN: 12 mg/dL (ref 8–23)
CO2: 29 mmol/L (ref 22–32)
Calcium: 9 mg/dL (ref 8.9–10.3)
Chloride: 103 mmol/L (ref 98–111)
Creatinine, Ser: 0.87 mg/dL (ref 0.44–1.00)
GFR calc Af Amer: >60 mL/min (ref >60)
GFR calc non Af Amer: >60 mL/min (ref >60)
Glucose, Bld: 108 mg/dL — ABNORMAL HIGH (ref 70–99)
Potassium: 3.2 mmol/L — ABNORMAL LOW (ref 3.5–5.1)
Sodium: 141 mmol/L (ref 135–145)
Total Bilirubin: 0.4 mg/dL (ref 0.3–1.2)
Total Protein: 7.8 g/dL (ref 6.5–8.1)

## 2019-01-13 LAB — URINALYSIS, COMPLETE (UACMP) WITH MICROSCOPIC
Bacteria, UA: NONE SEEN
Bilirubin Urine: NEGATIVE
Glucose, UA: NEGATIVE mg/dL
Ketones, ur: NEGATIVE mg/dL
Leukocytes,Ua: NEGATIVE
Nitrite: NEGATIVE
Protein, ur: NEGATIVE mg/dL
Specific Gravity, Urine: 1.025 (ref 1.005–1.030)
pH: 6 (ref 5.0–8.0)

## 2019-01-13 LAB — LIPASE, BLOOD: Lipase: 21 U/L (ref 11–51)

## 2019-01-13 MED ORDER — SODIUM CHLORIDE 0.9 % IV BOLUS
500.0000 mL | Freq: Once | INTRAVENOUS | Status: AC
  Administered 2019-01-13: 500 mL via INTRAVENOUS

## 2019-01-13 MED ORDER — ONDANSETRON HCL 4 MG/2ML IJ SOLN
4.0000 mg | Freq: Once | INTRAMUSCULAR | Status: AC
  Administered 2019-01-13: 4 mg via INTRAVENOUS
  Filled 2019-01-13: qty 2

## 2019-01-13 MED ORDER — IOHEXOL 300 MG/ML  SOLN
100.0000 mL | Freq: Once | INTRAMUSCULAR | Status: AC | PRN
  Administered 2019-01-13: 100 mL via INTRAVENOUS

## 2019-01-13 MED ORDER — MORPHINE SULFATE (PF) 4 MG/ML IV SOLN
4.0000 mg | Freq: Once | INTRAVENOUS | Status: AC
  Administered 2019-01-13: 4 mg via INTRAVENOUS
  Filled 2019-01-13: qty 1

## 2019-01-13 NOTE — ED Notes (Signed)
Pt reports "it still hurts" pt is yawning

## 2019-01-13 NOTE — ED Notes (Signed)
This RN responded to pt call bell, pt stating that stomach hurts, this RN apologized for the wait and assured pt MD would be in soon.

## 2019-01-13 NOTE — ED Provider Notes (Signed)
[facility] Emergency Department Provider Note ____________________________________________   First MD Initiated Contact with Patient 01/13/19 2026     (approximate)  I have reviewed the triage vital signs and the nursing notes.   HISTORY  Chief Complaint Abdominal Pain  Level 5 caveat: History of present illness limited due to poor historian  HPI Taylor Johnson is a 73 y.o. female with PMH as noted below who presents with abdominal pain since this afternoon, persistent course, associated with nausea but no vomiting.  The patient denies fever or diarrhea.  She states that the symptoms feel similar to when she was in the hospital most recently although it appears that her most recent hospitalization was actually for blood in the stool.  The patient denies any active rectal bleeding at this time.  We also spoke to her sister who states that the patient has been at her baseline mental status but is just "shy" sometimes in answering questions.  Past Medical History:  Diagnosis Date  . Abdominal pain 01/27/2017  . Anxiety   . Anxiety, generalized 05/11/2015  . Asthma   . Cough 06/02/2016   Chronic - followed by Pulmonary  . Depression   . Developmental delay   . Diverticulitis   . Diverticulosis of colon 05/11/2015  . Dysphagia 10/15/2015   Routine Ba Swallow normal; rec modified barium swallow   . Hypoxia 09/12/2015  . Leg swelling   . Localized edema 04/15/2016  . Microscopic hematuria 04/15/2016  . Nausea and vomiting 01/29/2017  . Orthostatic hypotension 03/16/2017  . OSA on CPAP 11/18/2015   CPAP @ 18 cm H2O started 02/2016  . Rectal bleeding 10/15/2018  . Urge incontinence of urine 05/12/2015    Patient Active Problem List   Diagnosis Date Noted  . Elevated blood pressure reading 10/23/2018  . Functional systolic murmur 24/58/0998  . Developmental delay disorder 02/06/2017  . 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 Cough 06/02/2016  . Localized edema 04/15/2016  . Microscopic hematuria 04/15/2016  . OSA on CPAP 11/18/2015  . Dysphagia 10/15/2015  . Hypoxia 09/12/2015  . Urge incontinence of urine 05/12/2015  . Diverticulosis of colon 05/11/2015  . Anxiety, generalized 05/11/2015    Past Surgical History:  Procedure Laterality Date  . ABDOMINAL HYSTERECTOMY    . BREAST BIOPSY Left    neg  . COLON SURGERY    . ESOPHAGOGASTRODUODENOSCOPY (EGD) WITH PROPOFOL N/A 01/29/2017   Procedure: ESOPHAGOGASTRODUODENOSCOPY (EGD) WITH PROPOFOL;  Surgeon: Wilford Corner, MD;  Location: [facility] ENDOSCOPY;  Service: Endoscopy;  Laterality: N/A;    Prior to Admission medications   Medication Sig Start Date End Date Taking? Authorizing Provider  amoxicillin (AMOXIL) 500 MG capsule TAKE ONE TABLET BY MOUTH TWICE DAILY FOR FOURTEEN DAYS 12/03/18   [provider]  hydrALAZINE (APRESOLINE) 25 MG tablet Take 1 tablet (25 mg total) by mouth 3 (three) times daily. 10/23/18   [name], MD  omeprazole (PRILOSEC) 40 MG capsule Take 1 capsule (40 mg total) by mouth 2 (two) times daily before a meal for 14 days. 12/03/18 12/17/18  Lin Landsman, MD  vitamin B-12 (CYANOCOBALAMIN) 500 MCG tablet Take 500 mcg by mouth daily.    [provider]    Allergies Patient has no known allergies.  Family History  Problem Relation Age of Onset  . Hypertension Mother   . Kidney disease Mother   . Dementia Mother   . Cancer Father  lung  . Bladder Cancer Neg Hx   . Breast cancer Neg Hx     Social History Social History   Tobacco Use  . Smoking status: Never Smoker  . Smokeless tobacco: Never Used  . Tobacco comment: smoking cessation materials not required  Substance Use Topics  . Alcohol use: No    Alcohol/week: 0.0 standard drinks  . Drug use: No    Review of Systems Level 5 caveat: Review of systems limited due to poor historian Constitutional: No fever. Cardiovascular: Denies  chest pain. Respiratory: Denies shortness of breath. Gastrointestinal: Positive for nausea. Musculoskeletal: Negative for back pain. Neurological: Negative for headache.   ____________________________________________   PHYSICAL EXAM:  VITAL SIGNS: ED Triage Vitals [01/13/19 2007]  Enc Vitals Group     BP (!) 178/78     Pulse Rate 80     Resp (!) 22     Temp 98.2 F (36.8 C)     Temp Source Oral     SpO2 98 %     Weight      Height      Head Circumference      Peak Flow      Pain Score      Pain Loc      Pain Edu?      Excl. in [place]?     Constitutional: Alert and oriented.  Uncomfortable appearing but in no acute distress. Eyes: Conjunctivae are normal.  Head: Atraumatic. Nose: No congestion/rhinnorhea. Mouth/Throat: Mucous membranes are slightly dry.   Neck: Normal range of motion.  Cardiovascular:Good peripheral circulation. Respiratory: Normal respiratory effort.  Gastrointestinal: Soft with mild diffuse tenderness, possibly slightly worse in the right midabdomen. No distention.  Genitourinary: No flank tenderness. Musculoskeletal: Extremities warm and well perfused.  Neurologic:  Normal speech and language. No gross focal neurologic deficits are appreciated.  Skin:  Skin is warm and dry. No rash noted. Psychiatric: Mood and affect are normal. Speech and behavior are normal.  ____________________________________________   LABS (all labs ordered are listed, but only abnormal results are displayed)  Labs Reviewed  COMPREHENSIVE METABOLIC PANEL - Abnormal; Notable for the following components:      Result Value   Potassium 3.2 (*)    Glucose, Bld 108 (*)    AST 13 (*)    All other components within normal limits  CBC - Abnormal; Notable for the following components:   Hemoglobin 11.0 (*)    MCHC 29.9 (*)    RDW 16.2 (*)    All other components within normal limits  LIPASE, BLOOD  URINALYSIS, COMPLETE (UACMP) WITH MICROSCOPIC    ____________________________________________  EKG  ED ECG REPORT I, Arta Silence, the attending physician, personally viewed and interpreted this ECG.  Date: 01/13/2019 EKG Time: 2005 Rate: 79 Rhythm: normal sinus rhythm QRS Axis: normal Intervals: normal ST/T Wave abnormalities: Nonspecific T wave abnormalities laterally Narrative Interpretation: no evidence of acute ischemia; no significant change when compared to EKG of 10/15/2018  ____________________________________________  RADIOLOGY  CT abdomen: No acute abnormality  ____________________________________________   PROCEDURES  Procedure(s) performed: No  Procedures  Critical Care performed: No ____________________________________________   INITIAL IMPRESSION / ASSESSMENT AND PLAN / ED COURSE  Pertinent labs & imaging results that were available during my care of the patient were reviewed by me and considered in my medical decision making (see chart for details).  73 year old female with PMH as noted above presents with diffuse abdominal pain since this afternoon which she states initially improved but has  now returned.  It is associated with nausea but no vomiting.  The patient is overall a poor historian and is unsure about the answer to many of my questions, however we spoke to the sister on the phone who confirmed that the patient is at her baseline mental status.  I reviewed the past medical records in Epic; the patient was most recently admitted in March of 2020 for rectal bleeding which resolved spontaneously.  She did not have endoscopy at that time.  On exam the patient is uncomfortable appearing but in no distress.  She is hypertensive with otherwise normal vital signs.  The abdomen is soft with possible mild tenderness on the right side.  Given the patient's age and comorbidities differential is broad but includes colitis, diverticulitis, gastritis, PUD, hepatobiliary etiology or less likely  obstruction.  We will obtain lab work-up, CT abdomen, and reassess.  ----------------------------------------- 11:36 PM on 01/13/2019 -----------------------------------------  CT abdomen is unremarkable.  The patient is feeling better after pain and nausea medications.  She is still pending a UA.  If she is tolerating p.o. I anticipate that she may be able to go home.  I am signing her out to the oncoming physician Dr. Joni Fears. ____________________________________________   FINAL CLINICAL IMPRESSION(S) / ED DIAGNOSES  Final diagnoses:  Generalized abdominal pain      NEW MEDICATIONS STARTED DURING THIS VISIT:  New Prescriptions   No medications on file     Note:  This document was prepared using Dragon voice recognition software and may include unintentional dictation errors.    Arta Silence, MD 01/13/19 2337

## 2019-01-13 NOTE — ED Notes (Signed)
Pt resting. Respirations unlabored.

## 2019-01-13 NOTE — ED Notes (Signed)
PT c/o headache and dizziness after standing for restroom. MD stafford made aware

## 2019-01-13 NOTE — ED Notes (Signed)
Spoke with pt's sisters and they both state pt does not have actual legal guardian, but sisters take care of pt and take her to her dr's appts. Sisters provided updates.

## 2019-01-13 NOTE — ED Triage Notes (Signed)
To Ed via EMS from home. C/o abd pain mid abd. Per ems, pt calm until pain is mentioned. Pain started this am. NO NVD. PT calm and cooperataive at this time. Hx HTN and developmental delay. EMS states pt could not state name or birthday to them, ems called pt's sister and pt's sister states that this is normal behavior for pt until she gets to know someone.

## 2019-01-13 NOTE — ED Notes (Signed)
O2 sats dropped to 84%, 2lpm [place] added

## 2019-01-14 ENCOUNTER — Other Ambulatory Visit: Payer: Self-pay

## 2019-01-14 ENCOUNTER — Emergency Department: Payer: Medicare Other

## 2019-01-14 DIAGNOSIS — R531 Weakness: Secondary | ICD-10-CM

## 2019-01-14 LAB — BLOOD GAS, VENOUS
Acid-Base Excess: 5.7 mmol/L — ABNORMAL HIGH (ref 0.0–2.0)
Bicarbonate: 34 mmol/L — ABNORMAL HIGH (ref 20.0–28.0)
O2 Saturation: 66.9 %
Patient temperature: 37
pCO2, Ven: 66 mmHg — ABNORMAL HIGH (ref 44.0–60.0)
pH, Ven: 7.32 (ref 7.250–7.430)
pO2, Ven: 38 mmHg (ref 32.0–45.0)

## 2019-01-14 LAB — SARS CORONAVIRUS 2 BY RT PCR (HOSPITAL ORDER, PERFORMED IN ~~LOC~~ HOSPITAL LAB): SARS Coronavirus 2: NEGATIVE

## 2019-01-14 LAB — TROPONIN I (HIGH SENSITIVITY): Troponin I (High Sensitivity): 8 ng/L (ref <18)

## 2019-01-14 LAB — TSH: TSH: 1.96 u[IU]/mL (ref 0.350–4.500)

## 2019-01-14 LAB — GLUCOSE, CAPILLARY: Glucose-Capillary: 109 mg/dL — ABNORMAL HIGH (ref 70–99)

## 2019-01-14 MED ORDER — ACETAMINOPHEN 325 MG PO TABS: 650.0000 mg | ORAL_TABLET | Freq: Four times a day (QID) | ORAL | Status: DC | PRN

## 2019-01-14 MED ORDER — ONDANSETRON HCL 4 MG/2ML IJ SOLN
4.0000 mg | Freq: Four times a day (QID) | INTRAMUSCULAR | Status: DC | PRN
  Administered 2019-01-14 – 2019-01-15 (×5): 4 mg via INTRAVENOUS
  Filled 2019-01-14 (×5): qty 2

## 2019-01-14 MED ORDER — SODIUM CHLORIDE 0.9 % IV BOLUS
500.0000 mL | Freq: Once | INTRAVENOUS | Status: AC
  Administered 2019-01-14: 500 mL via INTRAVENOUS

## 2019-01-14 MED ORDER — PANTOPRAZOLE SODIUM 40 MG IV SOLR
40.0000 mg | Freq: Two times a day (BID) | INTRAVENOUS | Status: DC
  Administered 2019-01-14 – 2019-01-16 (×5): 40 mg via INTRAVENOUS
  Filled 2019-01-14 (×5): qty 40

## 2019-01-14 MED ORDER — ONDANSETRON HCL 4 MG PO TABS: 4.0000 mg | ORAL_TABLET | Freq: Four times a day (QID) | ORAL | Status: DC | PRN

## 2019-01-14 MED ORDER — DOCUSATE SODIUM 100 MG PO CAPS
100.0000 mg | ORAL_CAPSULE | Freq: Two times a day (BID) | ORAL | Status: DC
  Administered 2019-01-14 – 2019-01-16 (×5): 100 mg via ORAL
  Filled 2019-01-14 (×5): qty 1

## 2019-01-14 MED ORDER — KETOROLAC TROMETHAMINE 15 MG/ML IJ SOLN
15.0000 mg | Freq: Four times a day (QID) | INTRAMUSCULAR | Status: DC | PRN
  Administered 2019-01-14: 10:00:00 15 mg via INTRAVENOUS
  Filled 2019-01-14 (×2): qty 1

## 2019-01-14 MED ORDER — HYDRALAZINE HCL 25 MG PO TABS
25.0000 mg | ORAL_TABLET | Freq: Three times a day (TID) | ORAL | Status: DC
  Administered 2019-01-14 – 2019-01-16 (×7): 25 mg via ORAL
  Filled 2019-01-14 (×9): qty 1

## 2019-01-14 MED ORDER — ONDANSETRON HCL 4 MG/2ML IJ SOLN
4.0000 mg | Freq: Once | INTRAMUSCULAR | Status: AC
  Administered 2019-01-14: 4 mg via INTRAVENOUS
  Filled 2019-01-14: qty 2

## 2019-01-14 MED ORDER — ENOXAPARIN SODIUM 40 MG/0.4ML ~~LOC~~ SOLN
40.0000 mg | SUBCUTANEOUS | Status: DC
  Administered 2019-01-14: 21:00:00 40 mg via SUBCUTANEOUS
  Filled 2019-01-14: qty 0.4

## 2019-01-14 MED ORDER — KETOROLAC TROMETHAMINE 15 MG/ML IJ SOLN
15.0000 mg | Freq: Four times a day (QID) | INTRAMUSCULAR | Status: DC | PRN
  Administered 2019-01-14 – 2019-01-15 (×2): 15 mg via INTRAVENOUS
  Filled 2019-01-14 (×3): qty 1

## 2019-01-14 MED ORDER — MORPHINE SULFATE (PF) 2 MG/ML IV SOLN
2.0000 mg | INTRAVENOUS | Status: AC
  Administered 2019-01-14: 2 mg via INTRAVENOUS
  Filled 2019-01-14: qty 1

## 2019-01-14 MED ORDER — ACETAMINOPHEN 650 MG RE SUPP: 650.0000 mg | Freq: Four times a day (QID) | RECTAL | Status: DC | PRN

## 2019-01-14 MED ORDER — PROCHLORPERAZINE EDISYLATE 10 MG/2ML IJ SOLN
10.0000 mg | INTRAMUSCULAR | Status: AC
  Administered 2019-01-14: 10 mg via INTRAVENOUS
  Filled 2019-01-14: qty 2

## 2019-01-14 MED ORDER — POTASSIUM CHLORIDE IN NACL 40-0.9 MEQ/L-% IV SOLN
INTRAVENOUS | Status: DC
  Administered 2019-01-14 – 2019-01-16 (×5): 125 mL/h via INTRAVENOUS
  Filled 2019-01-14 (×11): qty 1000

## 2019-01-14 NOTE — ED Notes (Signed)
ED TO INPATIENT HANDOFF REPORT  ED Nurse Name and Phone #:  [phone]  S Name/Age/Gender Taylor Johnson 73 y.o. female Room/Bed: ED24A/ED24A  Code Status   Code Status: Prior  Home/SNF/Other Home Patient oriented to: self Is this baseline? Yes   Triage Complete: Triage complete  Chief Complaint Abdominal Pain  Triage Note  To Ed via EMS from home. C/o abd pain mid abd. Per ems, pt calm until pain is mentioned. Pain started this am. NO NVD. PT calm and cooperataive at this time. Hx HTN and developmental delay. EMS states pt could not state name or birthday to them, ems called pt's sister and pt's sister states that this is normal behavior for pt until she gets to know someone.    Allergies No Known Allergies  Level of Care/Admitting Diagnosis ED Disposition    ED Disposition Condition [facility] Area: [place] [[id number]]  Level of Care: Med-Surg [16]  Covid Evaluation: Screening Protocol (No Symptoms)  Diagnosis: Weakness [269485]  Admitting Physician: Harrie Foreman [[id number]]  Attending Physician: Harrie Foreman [phone]  PT Class (Do Not Modify): Observation [104]  PT Acc Code (Do Not Modify): Observation [10022]       B Medical/Surgery History Past Medical History:  Diagnosis Date  . Abdominal pain 01/27/2017  . Anxiety   . Anxiety, generalized 05/11/2015  . Asthma   . Cough 06/02/2016   Chronic - followed by Pulmonary  . Depression   . Developmental delay   . Diverticulitis   . Diverticulosis of colon 05/11/2015  . Dysphagia 10/15/2015   Routine Ba Swallow normal; rec modified barium swallow   . Hypoxia 09/12/2015  . Leg swelling   . Localized edema 04/15/2016  . Microscopic hematuria 04/15/2016  . Nausea and vomiting 01/29/2017  . Orthostatic hypotension 03/16/2017  . OSA on CPAP 11/18/2015   CPAP @ 18 cm H2O started 02/2016  . Rectal bleeding 10/15/2018  . Urge incontinence of urine 05/12/2015   Past Surgical  History:  Procedure Laterality Date  . ABDOMINAL HYSTERECTOMY    . BREAST BIOPSY Left    neg  . COLON SURGERY    . ESOPHAGOGASTRODUODENOSCOPY (EGD) WITH PROPOFOL N/A 01/29/2017   Procedure: ESOPHAGOGASTRODUODENOSCOPY (EGD) WITH PROPOFOL;  Surgeon: Wilford Corner, MD;  Location: [facility] ENDOSCOPY;  Service: Endoscopy;  Laterality: N/A;     A IV Location/Drains/Wounds Patient Lines/Drains/Airways Status   Active Line/Drains/Airways    Name:   Placement date:   Placement time:   Site:   Days:   Peripheral IV 01/13/19 Left Antecubital   01/13/19    1939    Antecubital   1          Intake/Output Last 24 hours  Intake/Output Summary (Last 24 hours) at 01/14/2019 0656 Last data filed at 01/14/2019 0253 Gross per 24 hour  Intake 1000 ml  Output -  Net 1000 ml    Labs/Imaging Results for orders placed or performed during the hospital encounter of 01/13/19 (from the past 48 hour(s))  Lipase, blood     Status: None   Collection Time: 01/13/19  8:00 PM  Result Value Ref Range   Lipase 21 11 - 51 U/L    Comment: Performed at [facility], [address] [place], [place] [address]  Comprehensive metabolic panel     Status: Abnormal   Collection Time: 01/13/19  8:00 PM  Result Value Ref Range   Sodium 141 135 - 145 mmol/L  Potassium 3.2 (L) 3.5 - 5.1 mmol/L   Chloride 103 98 - 111 mmol/L   CO2 29 22 - 32 mmol/L   Glucose, Bld 108 (H) 70 - 99 mg/dL   BUN 12 8 - 23 mg/dL   Creatinine, Ser 0.87 0.44 - 1.00 mg/dL   Calcium 9.0 8.9 - 10.3 mg/dL   Total Protein 7.8 6.5 - 8.1 g/dL   Albumin 4.0 3.5 - 5.0 g/dL   AST 13 (L) 15 - 41 U/L   ALT 9 0 - 44 U/L   Alkaline Phosphatase 105 38 - 126 U/L   Total Bilirubin 0.4 0.3 - 1.2 mg/dL   GFR calc non Af Amer >60 >60 mL/min   GFR calc Af Amer >60 >60 mL/min   Anion gap 9 5 - 15    Comment: Performed at [facility], [address] [place], [place] [address]  CBC     Status: Abnormal   Collection Time: 01/13/19  8:00  PM  Result Value Ref Range   WBC 5.0 4.0 - 10.5 K/uL   RBC 4.05 3.87 - 5.11 MIL/uL   Hemoglobin 11.0 (L) 12.0 - 15.0 g/dL   HCT 36.8 36.0 - 46.0 %   MCV 90.9 80.0 - 100.0 fL   MCH 27.2 26.0 - 34.0 pg   MCHC 29.9 (L) 30.0 - 36.0 g/dL   RDW 16.2 (H) 11.5 - 15.5 %   Platelets 234 150 - 400 K/uL   nRBC 0.0 0.0 - 0.2 %    Comment: Performed at [facility], [address] [place], [place] [address]  Urinalysis, Complete w Microscopic     Status: Abnormal   Collection Time: 01/13/19 11:30 PM  Result Value Ref Range   Color, Urine YELLOW (A) YELLOW   APPearance CLEAR (A) CLEAR   Specific Gravity, Urine 1.025 1.005 - 1.030   pH 6.0 5.0 - 8.0   Glucose, UA NEGATIVE NEGATIVE mg/dL   Hgb urine dipstick SMALL (A) NEGATIVE   Bilirubin Urine NEGATIVE NEGATIVE   Ketones, ur NEGATIVE NEGATIVE mg/dL   Protein, ur NEGATIVE NEGATIVE mg/dL   Nitrite NEGATIVE NEGATIVE   Leukocytes,Ua NEGATIVE NEGATIVE   RBC / HPF 6-10 0 - 5 RBC/hpf   WBC, UA 0-5 0 - 5 WBC/hpf   Bacteria, UA NONE SEEN NONE SEEN   Squamous Epithelial / LPF 0-5 0 - 5    Comment: Performed at [facility], [place], [place] [address]  Troponin I (High Sensitivity)     Status: None   Collection Time: 01/13/19 11:30 PM  Result Value Ref Range   Troponin I (High Sensitivity) 8 <18 ng/L    Comment: (NOTE) Elevated high sensitivity troponin I (hsTnI) values and significant  changes across serial measurements may suggest ACS but many other  chronic and acute conditions are known to elevate hsTnI results.  Refer to the "Links" section for chest pain algorithms and additional  guidance. Performed at [facility], [address] [place], [place] [address]   TSH     Status: None   Collection Time: 01/13/19 11:30 PM  Result Value Ref Range   TSH 1.960 0.350 - 4.500 uIU/mL    Comment: Performed by a 3rd Generation assay with a functional sensitivity of <=0.01 uIU/mL. Performed at [facility], [address] [place], [place] [address]   Glucose, capillary     Status: Abnormal   Collection Time: 01/14/19  4:53 AM  Result Value Ref Range  Glucose-Capillary 109 (H) 70 - 99 mg/dL  Blood gas, venous     Status: Abnormal   Collection Time: 01/14/19  5:15 AM  Result Value Ref Range   pH, Ven 7.32 7.250 - 7.430   pCO2, Ven 66 (H) 44.0 - 60.0 mmHg   pO2, Ven 38.0 32.0 - 45.0 mmHg   Bicarbonate 34.0 (H) 20.0 - 28.0 mmol/L   Acid-Base Excess 5.7 (H) 0.0 - 2.0 mmol/L   O2 Saturation 66.9 %   Patient temperature 37.0    Collection site LINE    Sample type VENOUS     Comment: Performed at [facility], [address] [place], [place] [address]   Ct Abdomen Pelvis W Contrast  Result Date: 01/13/2019 CLINICAL DATA:  Generalized abdominal pain EXAM: CT ABDOMEN AND PELVIS WITH CONTRAST TECHNIQUE: Multidetector CT imaging of the abdomen and pelvis was performed using the standard protocol following bolus administration of intravenous contrast. CONTRAST:  169mL OMNIPAQUE IOHEXOL 300 MG/ML  SOLN COMPARISON:  02/07/2018 FINDINGS: Lower chest: Cardiomegaly. Small pericardial effusion. No pleural effusions. Hepatobiliary: No focal hepatic abnormality. Gallbladder unremarkable. Pancreas: No focal abnormality or ductal dilatation. Spleen: No focal abnormality.  Normal size. Adrenals/Urinary Tract: No adrenal abnormality. No focal renal abnormality. No stones or hydronephrosis. Urinary bladder is unremarkable. Stomach/Bowel: Diffuse colonic diverticulosis. No active diverticulitis. Duodenal diverticula noted. No evidence of bowel obstruction. Stomach decompressed, grossly unremarkable. Vascular/Lymphatic: No evidence of aneurysm or adenopathy. Reproductive: Calcified fibroids in the uterus.  No adnexal mass. Other: No free fluid or free air. Musculoskeletal: No acute bony abnormality. IMPRESSION: Diffuse colonic diverticulosis.  No active diverticulitis. Cardiomegaly.  Small  pericardial effusion. Calcified uterine fibroids. No acute findings in the abdomen or pelvis. Electronically Signed   By: Rolm Baptise M.D.   On: 01/13/2019 22:02   Dg Chest Portable 1 View  Result Date: 01/14/2019 CLINICAL DATA:  Somnolence and hypoxia EXAM: PORTABLE CHEST 1 VIEW COMPARISON:  10/07/2016 FINDINGS: Chronic cardiomegaly and interstitial prominence. There is no edema, consolidation, effusion, or pneumothorax. IMPRESSION: Stable from prior.  No acute finding. Electronically Signed   By: Monte Fantasia M.D.   On: 01/14/2019 05:30    Pending Labs Unresulted Labs (From admission, onward)    Start     Ordered   01/14/19 0557  SARS Coronavirus 2 (CEPHEID - Performed in [place] hospital lab), Hosp Order  (Asymptomatic Patients Labs)  ONCE - STAT,   STAT    Question:  Rule Out  Answer:  Yes   01/14/19 0556   Signed and Held  Creatinine, serum  (enoxaparin (LOVENOX)    CrCl >/= 30 ml/min)  Weekly,   R    Comments: while on enoxaparin therapy    Signed and Held          Vitals/Pain Today's Vitals   01/13/19 2302 01/14/19 0030 01/14/19 0200 01/14/19 0414  BP:  (!) 178/74 (!) 168/79 (!) 125/59  Pulse:  62 (!) 57 (!) 46  Resp:    13  Temp:      TempSrc:      SpO2:  99% 98% 100%  PainSc: Asleep       Isolation Precautions No active isolations  Medications Medications  morphine 4 MG/ML injection 4 mg (4 mg Intravenous Given 01/13/19 2102)  ondansetron (ZOFRAN) injection 4 mg (4 mg Intravenous Given 01/13/19 2100)  sodium chloride 0.9 % bolus 500 mL (0 mLs Intravenous Stopped 01/13/19 2334)  iohexol (OMNIPAQUE) 300 MG/ML solution 100 mL (100 mLs Intravenous Contrast  Given 01/13/19 2144)  sodium chloride 0.9 % bolus 500 mL (0 mLs Intravenous Stopped 01/14/19 0253)  ondansetron (ZOFRAN) injection 4 mg (4 mg Intravenous Given 01/14/19 0026)    Mobility walks with person assist Low fall risk   Focused Assessments gi : bowel sounds present, last bm  yesterday   R Recommendations: See Admitting Provider Note  Report given to:   Additional Notes:  Developmentally delayed

## 2019-01-14 NOTE — ED Notes (Signed)
MD made aware of pt in [facility]

## 2019-01-14 NOTE — Progress Notes (Signed)
PT Cancellation Note  Patient Details Name: Taylor Johnson MRN: [id number] DOB: Jan 08, 1946   Cancelled Treatment:    Reason Eval/Treat Not Completed: Patient's level of consciousness. PT entered the room and attempted to rouse patient. Patient unable to keep her eyes open or participate in therapy this date. PT will re-attempt at a later time/date.   Royce Macadamia PT, DPT, CSCS    01/14/2019, 12:49 PM

## 2019-01-14 NOTE — Progress Notes (Signed)
OT Cancellation Note  Patient Details Name: Taylor Johnson MRN: [id number] DOB: 1945-09-08   Cancelled Treatment:    Reason Eval/Treat Not Completed: Fatigue/lethargy limiting ability to participate. Consult received, chart reviewed. Upon attempt, pt sleeping soundly. Wakes briefly to OT's voice and able to verbalize her name but then falls back asleep. Unable to maintain alertness despite verbal and tactile cues. Will re-attempt next date as medically appropriate.   Jeni Salles, MPH, MS, OTR/L ascom [phone] 01/14/19, 1:29 PM

## 2019-01-14 NOTE — ED Notes (Signed)
Attempted to call report, will call in 5 minutes

## 2019-01-14 NOTE — ED Notes (Signed)
PT spat up crackers and gingerale. MD notified. PT re-dosed with zofran and IV fluids. PT remains sat up in bed in case of vomiting.

## 2019-01-14 NOTE — ED Notes (Signed)
ED TO INPATIENT HANDOFF REPORT  ED Nurse Name and Phone #: Maclean Foister [phone]  S Name/Age/Gender Taylor Johnson 73 y.o. female Room/Bed: ED24A/ED24A  Code Status   Code Status: Prior  Home/SNF/Other Home Patient oriented to: self Is this baseline? Yes   Triage Complete: Triage complete  Chief Complaint Abdominal Pain  Triage Note  To Ed via EMS from home. C/o abd pain mid abd. Per ems, pt calm until pain is mentioned. Pain started this am. NO NVD. PT calm and cooperataive at this time. Hx HTN and developmental delay. EMS states pt could not state name or birthday to them, ems called pt's sister and pt's sister states that this is normal behavior for pt until she gets to know someone.    Allergies No Known Allergies  Level of Care/Admitting Diagnosis ED Disposition    ED Disposition Condition [facility] Area: [place] [[id number]]  Level of Care: Med-Surg [16]  Covid Evaluation: Screening Protocol (No Symptoms)  Diagnosis: Weakness [782956]  Admitting Physician: Harrie Foreman [[id number]]  Attending Physician: Harrie Foreman [phone]  PT Class (Do Not Modify): Observation [104]  PT Acc Code (Do Not Modify): Observation [10022]       B Medical/Surgery History Past Medical History:  Diagnosis Date  . Abdominal pain 01/27/2017  . Anxiety   . Anxiety, generalized 05/11/2015  . Asthma   . Cough 06/02/2016   Chronic - followed by Pulmonary  . Depression   . Developmental delay   . Diverticulitis   . Diverticulosis of colon 05/11/2015  . Dysphagia 10/15/2015   Routine Ba Swallow normal; rec modified barium swallow   . Hypoxia 09/12/2015  . Leg swelling   . Localized edema 04/15/2016  . Microscopic hematuria 04/15/2016  . Nausea and vomiting 01/29/2017  . Orthostatic hypotension 03/16/2017  . OSA on CPAP 11/18/2015   CPAP @ 18 cm H2O started 02/2016  . Rectal bleeding 10/15/2018  . Urge incontinence of urine 05/12/2015   Past  Surgical History:  Procedure Laterality Date  . ABDOMINAL HYSTERECTOMY    . BREAST BIOPSY Left    neg  . COLON SURGERY    . ESOPHAGOGASTRODUODENOSCOPY (EGD) WITH PROPOFOL N/A 01/29/2017   Procedure: ESOPHAGOGASTRODUODENOSCOPY (EGD) WITH PROPOFOL;  Surgeon: Wilford Corner, MD;  Location: [facility] ENDOSCOPY;  Service: Endoscopy;  Laterality: N/A;     A IV Location/Drains/Wounds Patient Lines/Drains/Airways Status   Active Line/Drains/Airways    Name:   Placement date:   Placement time:   Site:   Days:   Peripheral IV 01/13/19 Left Antecubital   01/13/19    1939    Antecubital   1          Intake/Output Last 24 hours  Intake/Output Summary (Last 24 hours) at 01/14/2019 9528 Last data filed at 01/14/2019 0253 Gross per 24 hour  Intake 1000 ml  Output -  Net 1000 ml    Labs/Imaging Results for orders placed or performed during the hospital encounter of 01/13/19 (from the past 48 hour(s))  Lipase, blood     Status: None   Collection Time: 01/13/19  8:00 PM  Result Value Ref Range   Lipase 21 11 - 51 U/L    Comment: Performed at [facility], [address] [place], [place] [address]  Comprehensive metabolic panel     Status: Abnormal   Collection Time: 01/13/19  8:00 PM  Result Value Ref Range   Sodium 141 135 - 145 mmol/L  Potassium 3.2 (L) 3.5 - 5.1 mmol/L   Chloride 103 98 - 111 mmol/L   CO2 29 22 - 32 mmol/L   Glucose, Bld 108 (H) 70 - 99 mg/dL   BUN 12 8 - 23 mg/dL   Creatinine, Ser 0.87 0.44 - 1.00 mg/dL   Calcium 9.0 8.9 - 10.3 mg/dL   Total Protein 7.8 6.5 - 8.1 g/dL   Albumin 4.0 3.5 - 5.0 g/dL   AST 13 (L) 15 - 41 U/L   ALT 9 0 - 44 U/L   Alkaline Phosphatase 105 38 - 126 U/L   Total Bilirubin 0.4 0.3 - 1.2 mg/dL   GFR calc non Af Amer >60 >60 mL/min   GFR calc Af Amer >60 >60 mL/min   Anion gap 9 5 - 15    Comment: Performed at [facility], [address] [place], [place] [address]  CBC     Status: Abnormal   Collection Time:  01/13/19  8:00 PM  Result Value Ref Range   WBC 5.0 4.0 - 10.5 K/uL   RBC 4.05 3.87 - 5.11 MIL/uL   Hemoglobin 11.0 (L) 12.0 - 15.0 g/dL   HCT 36.8 36.0 - 46.0 %   MCV 90.9 80.0 - 100.0 fL   MCH 27.2 26.0 - 34.0 pg   MCHC 29.9 (L) 30.0 - 36.0 g/dL   RDW 16.2 (H) 11.5 - 15.5 %   Platelets 234 150 - 400 K/uL   nRBC 0.0 0.0 - 0.2 %    Comment: Performed at [facility], [address] [place], [place] [address]  Urinalysis, Complete w Microscopic     Status: Abnormal   Collection Time: 01/13/19 11:30 PM  Result Value Ref Range   Color, Urine YELLOW (A) YELLOW   APPearance CLEAR (A) CLEAR   Specific Gravity, Urine 1.025 1.005 - 1.030   pH 6.0 5.0 - 8.0   Glucose, UA NEGATIVE NEGATIVE mg/dL   Hgb urine dipstick SMALL (A) NEGATIVE   Bilirubin Urine NEGATIVE NEGATIVE   Ketones, ur NEGATIVE NEGATIVE mg/dL   Protein, ur NEGATIVE NEGATIVE mg/dL   Nitrite NEGATIVE NEGATIVE   Leukocytes,Ua NEGATIVE NEGATIVE   RBC / HPF 6-10 0 - 5 RBC/hpf   WBC, UA 0-5 0 - 5 WBC/hpf   Bacteria, UA NONE SEEN NONE SEEN   Squamous Epithelial / LPF 0-5 0 - 5    Comment: Performed at [facility], [place], [place] [address]  Troponin I (High Sensitivity)     Status: None   Collection Time: 01/13/19 11:30 PM  Result Value Ref Range   Troponin I (High Sensitivity) 8 <18 ng/L    Comment: (NOTE) Elevated high sensitivity troponin I (hsTnI) values and significant  changes across serial measurements may suggest ACS but many other  chronic and acute conditions are known to elevate hsTnI results.  Refer to the "Links" section for chest pain algorithms and additional  guidance. Performed at [facility], [address] [place], [place] [address]   TSH     Status: None   Collection Time: 01/13/19 11:30 PM  Result Value Ref Range   TSH 1.960 0.350 - 4.500 uIU/mL    Comment: Performed by a 3rd Generation assay with a functional sensitivity of <=0.01  uIU/mL. Performed at [facility], [address] [place], [place] [address]   Glucose, capillary     Status: Abnormal   Collection Time: 01/14/19  4:53 AM  Result Value Ref Range  Glucose-Capillary 109 (H) 70 - 99 mg/dL  Blood gas, venous     Status: Abnormal   Collection Time: 01/14/19  5:15 AM  Result Value Ref Range   pH, Ven 7.32 7.250 - 7.430   pCO2, Ven 66 (H) 44.0 - 60.0 mmHg   pO2, Ven 38.0 32.0 - 45.0 mmHg   Bicarbonate 34.0 (H) 20.0 - 28.0 mmol/L   Acid-Base Excess 5.7 (H) 0.0 - 2.0 mmol/L   O2 Saturation 66.9 %   Patient temperature 37.0    Collection site LINE    Sample type VENOUS     Comment: Performed at [facility], [address], [place], [place] [address]  SARS Coronavirus 2 (CEPHEID - Performed in [place] hospital lab), Hosp Order     Status: None   Collection Time: 01/14/19  6:05 AM   Specimen: Nasopharyngeal Swab  Result Value Ref Range   SARS Coronavirus 2 NEGATIVE NEGATIVE    Comment: (NOTE) If result is NEGATIVE SARS-CoV-2 target nucleic acids are NOT DETECTED. The SARS-CoV-2 RNA is generally detectable in upper and lower  respiratory specimens during the acute phase of infection. The lowest  concentration of SARS-CoV-2 viral copies this assay can detect is 250  copies / mL. A negative result does not preclude SARS-CoV-2 infection  and should not be used as the sole basis for treatment or other  patient management decisions.  A negative result may occur with  improper specimen collection / handling, submission of specimen other  than nasopharyngeal swab, presence of viral mutation(s) within the  areas targeted by this assay, and inadequate number of viral copies  (<250 copies / mL). A negative result must be combined with clinical  observations, patient history, and epidemiological information. If result is POSITIVE SARS-CoV-2 target nucleic acids are DETECTED. The SARS-CoV-2 RNA is generally detectable in upper and lower   respiratory specimens dur ing the acute phase of infection.  Positive  results are indicative of active infection with SARS-CoV-2.  Clinical  correlation with patient history and other diagnostic information is  necessary to determine patient infection status.  Positive results do  not rule out bacterial infection or co-infection with other viruses. If result is PRESUMPTIVE POSTIVE SARS-CoV-2 nucleic acids MAY BE PRESENT.   A presumptive positive result was obtained on the submitted specimen  and confirmed on repeat testing.  While 2019 novel coronavirus  (SARS-CoV-2) nucleic acids may be present in the submitted sample  additional confirmatory testing may be necessary for epidemiological  and / or clinical management purposes  to differentiate between  SARS-CoV-2 and other Sarbecovirus currently known to infect humans.  If clinically indicated additional testing with an alternate test  methodology [phone]) is advised. The SARS-CoV-2 RNA is generally  detectable in upper and lower respiratory sp ecimens during the acute  phase of infection. The expected result is Negative. Fact Sheet for Patients:  StrictlyIdeas.no Fact Sheet for Healthcare Providers: BankingDealers.co.za This test is not yet approved or cleared by the [place] FDA and has been authorized for detection and/or diagnosis of SARS-CoV-2 by FDA under an Emergency Use Authorization (EUA).  This EUA will remain in effect (meaning this test can be used) for the duration of the COVID-19 declaration under Section 564(b)(1) of the Act, 21 U.S.C. section 360bbb-3(b)(1), unless the authorization is terminated or revoked sooner. Performed at [facility], [address] [place], [place] [address]    Ct Abdomen Pelvis W Contrast  Result Date: 01/13/2019 CLINICAL DATA:  Generalized  abdominal pain EXAM: CT ABDOMEN AND PELVIS WITH CONTRAST TECHNIQUE: Multidetector CT  imaging of the abdomen and pelvis was performed using the standard protocol following bolus administration of intravenous contrast. CONTRAST:  146mL OMNIPAQUE IOHEXOL 300 MG/ML  SOLN COMPARISON:  02/07/2018 FINDINGS: Lower chest: Cardiomegaly. Small pericardial effusion. No pleural effusions. Hepatobiliary: No focal hepatic abnormality. Gallbladder unremarkable. Pancreas: No focal abnormality or ductal dilatation. Spleen: No focal abnormality.  Normal size. Adrenals/Urinary Tract: No adrenal abnormality. No focal renal abnormality. No stones or hydronephrosis. Urinary bladder is unremarkable. Stomach/Bowel: Diffuse colonic diverticulosis. No active diverticulitis. Duodenal diverticula noted. No evidence of bowel obstruction. Stomach decompressed, grossly unremarkable. Vascular/Lymphatic: No evidence of aneurysm or adenopathy. Reproductive: Calcified fibroids in the uterus.  No adnexal mass. Other: No free fluid or free air. Musculoskeletal: No acute bony abnormality. IMPRESSION: Diffuse colonic diverticulosis.  No active diverticulitis. Cardiomegaly.  Small pericardial effusion. Calcified uterine fibroids. No acute findings in the abdomen or pelvis. Electronically Signed   By: Rolm Baptise M.D.   On: 01/13/2019 22:02   Dg Chest Portable 1 View  Result Date: 01/14/2019 CLINICAL DATA:  Somnolence and hypoxia EXAM: PORTABLE CHEST 1 VIEW COMPARISON:  10/07/2016 FINDINGS: Chronic cardiomegaly and interstitial prominence. There is no edema, consolidation, effusion, or pneumothorax. IMPRESSION: Stable from prior.  No acute finding. Electronically Signed   By: Monte Fantasia M.D.   On: 01/14/2019 05:30    Pending Labs Unresulted Labs (From admission, onward)    Start     Ordered   Signed and Held  Creatinine, serum  (enoxaparin (LOVENOX)    CrCl >/= 30 ml/min)  Weekly,   R    Comments: while on enoxaparin therapy    Signed and Held          Vitals/Pain Today's Vitals   01/14/19 0030 01/14/19 0200  01/14/19 0414 01/14/19 0700  BP: (!) 178/74 (!) 168/79 (!) 125/59 (!) 169/73  Pulse: 62 (!) 57 (!) 46 (!) 58  Resp:   13   Temp:      TempSrc:      SpO2: 99% 98% 100% 99%  PainSc:        Isolation Precautions No active isolations  Medications Medications  morphine 4 MG/ML injection 4 mg (4 mg Intravenous Given 01/13/19 2102)  ondansetron (ZOFRAN) injection 4 mg (4 mg Intravenous Given 01/13/19 2100)  sodium chloride 0.9 % bolus 500 mL (0 mLs Intravenous Stopped 01/13/19 2334)  iohexol (OMNIPAQUE) 300 MG/ML solution 100 mL (100 mLs Intravenous Contrast Given 01/13/19 2144)  sodium chloride 0.9 % bolus 500 mL (0 mLs Intravenous Stopped 01/14/19 0253)  ondansetron (ZOFRAN) injection 4 mg (4 mg Intravenous Given 01/14/19 0026)  prochlorperazine (COMPAZINE) injection 10 mg (10 mg Intravenous Given 01/14/19 0720)  morphine 2 MG/ML injection 2 mg (2 mg Intravenous Given 01/14/19 0720)    Mobility walks with device Low fall risk   Focused Assessments Neuro Assessment Handoff:  Swallow screen pass? NA Cardiac Rhythm: Normal sinus rhythm       Neuro Assessment:   Neuro Checks:      Last Documented NIHSS Modified Score:   Has TPA been given? No If patient is a Neuro Trauma and patient is going to OR before floor call report to [place] nurse: [phone] or [phone]     R Recommendations: See Admitting Provider Note  Report given to:   Additional Notes:

## 2019-01-14 NOTE — ED Notes (Signed)
PT sat up and given crackers and gingerale

## 2019-01-14 NOTE — ED Notes (Signed)
This RN has responded to call bell 3 times in the past 45 minutes for pt c/o belly pain. MD Marcille Blanco made aware. MD states he will put in orders.

## 2019-01-14 NOTE — H&P (Signed)
Taylor Johnson is an 73 y.o. female.   Chief Complaint: Abdominal pain HPI: The patient with past medical history of developmental delay, diverticulitis and asthma presents to the emergency department with abdominal pain.  The patient reports that her pain began last night.  She has been unable to eat due to nausea but she has not had any vomiting.  She was recently admitted to the hospital for the same symptoms but has not had any diarrhea.  She denies fever but is concerned that her mother has been sick and coughing.  It is unclear if her mother has been tested for novel coronavirus.  Laboratory evaluation was reassuring but due to weakness and inability to assure the patient safety upon discharge emergency department staff called the hospitalist service for admission.  Past Medical History:  Diagnosis Date  . Abdominal pain 01/27/2017  . Anxiety   . Anxiety, generalized 05/11/2015  . Asthma   . Cough 06/02/2016   Chronic - followed by Pulmonary  . Depression   . Developmental delay   . Diverticulitis   . Diverticulosis of colon 05/11/2015  . Dysphagia 10/15/2015   Routine Ba Swallow normal; rec modified barium swallow   . Hypoxia 09/12/2015  . Leg swelling   . Localized edema 04/15/2016  . Microscopic hematuria 04/15/2016  . Nausea and vomiting 01/29/2017  . Orthostatic hypotension 03/16/2017  . OSA on CPAP 11/18/2015   CPAP @ 18 cm H2O started 02/2016  . Rectal bleeding 10/15/2018  . Urge incontinence of urine 05/12/2015    Past Surgical History:  Procedure Laterality Date  . ABDOMINAL HYSTERECTOMY    . BREAST BIOPSY Left    neg  . COLON SURGERY    . ESOPHAGOGASTRODUODENOSCOPY (EGD) WITH PROPOFOL N/A 01/29/2017   Procedure: ESOPHAGOGASTRODUODENOSCOPY (EGD) WITH PROPOFOL;  Surgeon: Wilford Corner, MD;  Location: [facility] ENDOSCOPY;  Service: Endoscopy;  Laterality: N/A;    Family History  Problem Relation Age of Onset  . Hypertension Mother   . Kidney disease Mother   . Dementia  Mother   . Cancer Father        lung  . Bladder Cancer Neg Hx   . Breast cancer Neg Hx    Social History:  reports that she has never smoked. She has never used smokeless tobacco. She reports that she does not drink alcohol or use drugs.  Allergies: No Known Allergies  Prior to Admission medications   Medication Sig Start Date End Date Taking? Authorizing Provider  hydrALAZINE (APRESOLINE) 25 MG tablet Take 1 tablet (25 mg total) by mouth 3 (three) times daily. 10/23/18  Yes [name], MD  vitamin B-12 (CYANOCOBALAMIN) 500 MCG tablet Take 500 mcg by mouth daily.    [provider]     Results for orders placed or performed during the hospital encounter of 01/13/19 (from the past 48 hour(s))  Lipase, blood     Status: None   Collection Time: 01/13/19  8:00 PM  Result Value Ref Range   Lipase 21 11 - 51 U/L    Comment: Performed at [facility], [address] [place], [place] [address]  Comprehensive metabolic panel     Status: Abnormal   Collection Time: 01/13/19  8:00 PM  Result Value Ref Range   Sodium 141 135 - 145 mmol/L   Potassium 3.2 (L) 3.5 - 5.1 mmol/L   Chloride 103 98 - 111 mmol/L   CO2 29 22 - 32 mmol/L   Glucose, Bld 108 (H) 70 -  99 mg/dL   BUN 12 8 - 23 mg/dL   Creatinine, Ser 0.87 0.44 - 1.00 mg/dL   Calcium 9.0 8.9 - 10.3 mg/dL   Total Protein 7.8 6.5 - 8.1 g/dL   Albumin 4.0 3.5 - 5.0 g/dL   AST 13 (L) 15 - 41 U/L   ALT 9 0 - 44 U/L   Alkaline Phosphatase 105 38 - 126 U/L   Total Bilirubin 0.4 0.3 - 1.2 mg/dL   GFR calc non Af Amer >60 >60 mL/min   GFR calc Af Amer >60 >60 mL/min   Anion gap 9 5 - 15    Comment: Performed at [facility], [address] [place], [place] [address]  CBC     Status: Abnormal   Collection Time: 01/13/19  8:00 PM  Result Value Ref Range   WBC 5.0 4.0 - 10.5 K/uL   RBC 4.05 3.87 - 5.11 MIL/uL   Hemoglobin 11.0 (L) 12.0 - 15.0 g/dL   HCT 36.8 36.0 - 46.0 %   MCV 90.9 80.0 - 100.0 fL    MCH 27.2 26.0 - 34.0 pg   MCHC 29.9 (L) 30.0 - 36.0 g/dL   RDW 16.2 (H) 11.5 - 15.5 %   Platelets 234 150 - 400 K/uL   nRBC 0.0 0.0 - 0.2 %    Comment: Performed at [facility], [address] [place], [place] [address]  Urinalysis, Complete w Microscopic     Status: Abnormal   Collection Time: 01/13/19 11:30 PM  Result Value Ref Range   Color, Urine YELLOW (A) YELLOW   APPearance CLEAR (A) CLEAR   Specific Gravity, Urine 1.025 1.005 - 1.030   pH 6.0 5.0 - 8.0   Glucose, UA NEGATIVE NEGATIVE mg/dL   Hgb urine dipstick SMALL (A) NEGATIVE   Bilirubin Urine NEGATIVE NEGATIVE   Ketones, ur NEGATIVE NEGATIVE mg/dL   Protein, ur NEGATIVE NEGATIVE mg/dL   Nitrite NEGATIVE NEGATIVE   Leukocytes,Ua NEGATIVE NEGATIVE   RBC / HPF 6-10 0 - 5 RBC/hpf   WBC, UA 0-5 0 - 5 WBC/hpf   Bacteria, UA NONE SEEN NONE SEEN   Squamous Epithelial / LPF 0-5 0 - 5    Comment: Performed at [facility], [place], [place] [address]  Troponin I (High Sensitivity)     Status: None   Collection Time: 01/13/19 11:30 PM  Result Value Ref Range   Troponin I (High Sensitivity) 8 <18 ng/L    Comment: (NOTE) Elevated high sensitivity troponin I (hsTnI) values and significant  changes across serial measurements may suggest ACS but many other  chronic and acute conditions are known to elevate hsTnI results.  Refer to the "Links" section for chest pain algorithms and additional  guidance. Performed at [facility], [address] [place], [place] [address]   TSH     Status: None   Collection Time: 01/13/19 11:30 PM  Result Value Ref Range   TSH 1.960 0.350 - 4.500 uIU/mL    Comment: Performed by a 3rd Generation assay with a functional sensitivity of <=0.01 uIU/mL. Performed at [facility], [address] [place], [place] [address]   Glucose, capillary     Status: Abnormal   Collection Time: 01/14/19  4:53 AM  Result Value Ref Range    Glucose-Capillary 109 (H) 70 - 99 mg/dL  Blood gas, venous     Status: Abnormal   Collection Time: 01/14/19  5:15 AM  Result Value Ref  Range   pH, Ven 7.32 7.250 - 7.430   pCO2, Ven 66 (H) 44.0 - 60.0 mmHg   pO2, Ven 38.0 32.0 - 45.0 mmHg   Bicarbonate 34.0 (H) 20.0 - 28.0 mmol/L   Acid-Base Excess 5.7 (H) 0.0 - 2.0 mmol/L   O2 Saturation 66.9 %   Patient temperature 37.0    Collection site LINE    Sample type VENOUS     Comment: Performed at [facility], [address], [place], [place] [address]  SARS Coronavirus 2 (CEPHEID - Performed in [place] hospital lab), Hosp Order     Status: None   Collection Time: 01/14/19  6:05 AM   Specimen: Nasopharyngeal Swab  Result Value Ref Range   SARS Coronavirus 2 NEGATIVE NEGATIVE    Comment: (NOTE) If result is NEGATIVE SARS-CoV-2 target nucleic acids are NOT DETECTED. The SARS-CoV-2 RNA is generally detectable in upper and lower  respiratory specimens during the acute phase of infection. The lowest  concentration of SARS-CoV-2 viral copies this assay can detect is 250  copies / mL. A negative result does not preclude SARS-CoV-2 infection  and should not be used as the sole basis for treatment or other  patient management decisions.  A negative result may occur with  improper specimen collection / handling, submission of specimen other  than nasopharyngeal swab, presence of viral mutation(s) within the  areas targeted by this assay, and inadequate number of viral copies  (<250 copies / mL). A negative result must be combined with clinical  observations, patient history, and epidemiological information. If result is POSITIVE SARS-CoV-2 target nucleic acids are DETECTED. The SARS-CoV-2 RNA is generally detectable in upper and lower  respiratory specimens dur ing the acute phase of infection.  Positive  results are indicative of active infection with SARS-CoV-2.  Clinical  correlation with patient history and other diagnostic  information is  necessary to determine patient infection status.  Positive results do  not rule out bacterial infection or co-infection with other viruses. If result is PRESUMPTIVE POSTIVE SARS-CoV-2 nucleic acids MAY BE PRESENT.   A presumptive positive result was obtained on the submitted specimen  and confirmed on repeat testing.  While 2019 novel coronavirus  (SARS-CoV-2) nucleic acids may be present in the submitted sample  additional confirmatory testing may be necessary for epidemiological  and / or clinical management purposes  to differentiate between  SARS-CoV-2 and other Sarbecovirus currently known to infect humans.  If clinically indicated additional testing with an alternate test  methodology [phone]) is advised. The SARS-CoV-2 RNA is generally  detectable in upper and lower respiratory sp ecimens during the acute  phase of infection. The expected result is Negative. Fact Sheet for Patients:  StrictlyIdeas.no Fact Sheet for Healthcare Providers: BankingDealers.co.za This test is not yet approved or cleared by the [place] FDA and has been authorized for detection and/or diagnosis of SARS-CoV-2 by FDA under an Emergency Use Authorization (EUA).  This EUA will remain in effect (meaning this test can be used) for the duration of the COVID-19 declaration under Section 564(b)(1) of the Act, 21 U.S.C. section 360bbb-3(b)(1), unless the authorization is terminated or revoked sooner. Performed at [facility], [address]  [place], [place] [address]    Ct Abdomen Pelvis W Contrast  Result Date: 01/13/2019 CLINICAL DATA:  Generalized abdominal pain EXAM: CT ABDOMEN AND PELVIS WITH CONTRAST TECHNIQUE: Multidetector CT imaging of the abdomen and pelvis was performed using the standard protocol following bolus administration of intravenous  contrast. CONTRAST:  181mL OMNIPAQUE IOHEXOL 300 MG/ML  SOLN COMPARISON:   02/07/2018 FINDINGS: Lower chest: Cardiomegaly. Small pericardial effusion. No pleural effusions. Hepatobiliary: No focal hepatic abnormality. Gallbladder unremarkable. Pancreas: No focal abnormality or ductal dilatation. Spleen: No focal abnormality.  Normal size. Adrenals/Urinary Tract: No adrenal abnormality. No focal renal abnormality. No stones or hydronephrosis. Urinary bladder is unremarkable. Stomach/Bowel: Diffuse colonic diverticulosis. No active diverticulitis. Duodenal diverticula noted. No evidence of bowel obstruction. Stomach decompressed, grossly unremarkable. Vascular/Lymphatic: No evidence of aneurysm or adenopathy. Reproductive: Calcified fibroids in the uterus.  No adnexal mass. Other: No free fluid or free air. Musculoskeletal: No acute bony abnormality. IMPRESSION: Diffuse colonic diverticulosis.  No active diverticulitis. Cardiomegaly.  Small pericardial effusion. Calcified uterine fibroids. No acute findings in the abdomen or pelvis. Electronically Signed   By: Rolm Baptise M.D.   On: 01/13/2019 22:02   Dg Chest Portable 1 View  Result Date: 01/14/2019 CLINICAL DATA:  Somnolence and hypoxia EXAM: PORTABLE CHEST 1 VIEW COMPARISON:  10/07/2016 FINDINGS: Chronic cardiomegaly and interstitial prominence. There is no edema, consolidation, effusion, or pneumothorax. IMPRESSION: Stable from prior.  No acute finding. Electronically Signed   By: Monte Fantasia M.D.   On: 01/14/2019 05:30    Review of Systems  Constitutional: Negative for chills and fever.  HENT: Negative for sore throat and tinnitus.   Eyes: Negative for blurred vision and redness.  Respiratory: Negative for cough and shortness of breath.   Cardiovascular: Negative for chest pain, palpitations, orthopnea and PND.  Gastrointestinal: Positive for abdominal pain and nausea. Negative for diarrhea and vomiting.  Genitourinary: Negative for dysuria, frequency and urgency.  Musculoskeletal: Negative for joint pain and  myalgias.  Skin: Negative for rash.       No lesions  Neurological: Positive for weakness. Negative for speech change and focal weakness.  Endo/Heme/Allergies: Does not bruise/bleed easily.       No temperature intolerance  Psychiatric/Behavioral: Negative for depression and suicidal ideas.    Blood pressure (!) 169/73, pulse (!) 58, temperature 98.2 F (36.8 C), temperature source Oral, resp. rate 13, SpO2 99 %. Physical Exam  Vitals reviewed. Constitutional: She is oriented to person, place, and time. She appears well-developed and well-nourished. No distress.  HENT:  Head: Normocephalic and atraumatic.  Mouth/Throat: Oropharynx is clear and moist.  Eyes: Pupils are equal, round, and reactive to light. Conjunctivae and EOM are normal. No scleral icterus.  Neck: Normal range of motion. Neck supple. No JVD present. No tracheal deviation present. No thyromegaly present.  Cardiovascular: Normal rate, regular rhythm and normal heart sounds. Exam reveals no gallop and no friction rub.  No murmur heard. Respiratory: Effort normal and breath sounds normal.  GI: Soft. Bowel sounds are normal. She exhibits no distension. There is no abdominal tenderness.  Genitourinary:    Genitourinary Comments: Deferred   Musculoskeletal: Normal range of motion.        General: No edema.  Lymphadenopathy:    She has no cervical adenopathy.  Neurological: She is alert and oriented to person, place, and time. No cranial nerve deficit. She exhibits normal muscle tone.  Skin: Skin is warm and dry. No rash noted. No erythema.  Psychiatric: She has a normal mood and affect. Her behavior is normal. Judgment and thought content normal.     Assessment/Plan This is a 73 year old female admitted for weakness. 1.  Weakness: Generalized; unclear etiology.  Intermittent bradycardia may contribute.  She is COVID negative.  Urinalysis is also normal.  Will hydrate  with intravenous fluid and consult PT/OT for evaluation  and treatment prior to discharge. 2.  Hypertension: Uncontrolled; continue hydralazine.  Consider amlodipine as a second agent as beta-blockers would not be suitable as the patient's heart rate is already fairly low. 3.  Hypokalemia: Mild; replete potassium 4.  OSA: We will need to determine if the patient uses CPAP at night 5.  DVT prophylaxis: Lovenox 6.  GI prophylaxis: None The patient is a full code.  Time spent on admission orders and patient care approximately 45 minutes  Harrie Foreman, MD 01/14/2019, 8:08 AM

## 2019-01-14 NOTE — ED Provider Notes (Signed)
  -----------------------------------------   5:51 AM on 01/14/2019 -----------------------------------------  Assumed care from Dr. Cherylann Banas was CT scan of the abdomen and pelvis pending.  This resulted as unremarkable, but the patient remained very weak and unable to get out of bed.  She continues to describe feeling dizzy.  Checked a chest x-ray and a VBG which were both unremarkable.  Urinalysis unremarkable.  She has had prolonged periods of bradycardia with a heart rate as low as 40 while resting in the treatment bed.  Blood pressures remained stable, but with concern for symptomatic bradycardia and limitations of her ability to provide a detailed history because of her underlying developmental delay, have discussed with the hospitalist for further evaluation.  Patient has been unable to tolerate oral intake so far in the ED.  Final diagnoses:  Generalized abdominal pain  Bradycardia  Dizziness  Generalized weakness      Carrie Mew, MD 01/14/19 [phone]

## 2019-01-14 NOTE — Progress Notes (Signed)
[facility] NAME: Taylor Johnson    MR#:  [id number]  DATE OF BIRTH:  1945-09-07  SUBJECTIVE:   Patient here due to abdominal pain and some intermittent nausea.  Patient is CT scan abdomen pelvis was negative and LFTs are normal.  No other acute events or complaints presently.  REVIEW OF SYSTEMS:    Review of Systems  Constitutional: Negative for chills and fever.  HENT: Negative for congestion and tinnitus.   Eyes: Negative for blurred vision and double vision.  Respiratory: Negative for cough, shortness of breath and wheezing.   Cardiovascular: Negative for chest pain, orthopnea and PND.  Gastrointestinal: Positive for abdominal pain and nausea. Negative for diarrhea and vomiting.  Genitourinary: Negative for dysuria and hematuria.  Neurological: Negative for dizziness, sensory change and focal weakness.  All other systems reviewed and are negative.   Nutrition: Clear liquid Tolerating Diet: Yes Tolerating PT: Await Eval.   DRUG ALLERGIES:  No Known Allergies  VITALS:  Blood pressure (!) 147/80, pulse (!) 53, temperature 98.4 F (36.9 C), temperature source Oral, resp. rate 20, height 5\' 4"  (1.626 m), weight 117.2 kg, SpO2 98 %.  PHYSICAL EXAMINATION:   Physical Exam  GENERAL:  73 y.o.-year-old obese patient lying in bed in no acute distress.  EYES: Pupils equal, round, reactive to light and accommodation. No scleral icterus. Extraocular muscles intact.  HEENT: Head atraumatic, normocephalic. Oropharynx and nasopharynx clear.  NECK:  Supple, no jugular venous distention. No thyroid enlargement, no tenderness.  LUNGS: Normal breath sounds bilaterally, no wheezing, rales, rhonchi. No use of accessory muscles of respiration.  CARDIOVASCULAR: S1, S2 normal. No murmurs, rubs, or gallops.  ABDOMEN: Soft, nontender, nondistended. Bowel sounds present. No organomegaly or mass.  EXTREMITIES: No cyanosis, clubbing or edema b/l.     NEUROLOGIC: Cranial nerves II through XII are intact. No focal Motor or sensory deficits b/l.   PSYCHIATRIC: The patient is alert and oriented x 3.  SKIN: No obvious rash, lesion, or ulcer.    LABORATORY PANEL:   CBC Recent Labs  Lab 01/13/19 2000  WBC 5.0  HGB 11.0*  HCT 36.8  PLT 234   ------------------------------------------------------------------------------------------------------------------  Chemistries  Recent Labs  Lab 01/13/19 2000  NA 141  K 3.2*  CL 103  CO2 29  GLUCOSE 108*  BUN 12  CREATININE 0.87  CALCIUM 9.0  AST 13*  ALT 9  ALKPHOS 105  BILITOT 0.4   ------------------------------------------------------------------------------------------------------------------  Cardiac Enzymes No results for input(s): TROPONINI in the last 168 hours. ------------------------------------------------------------------------------------------------------------------  RADIOLOGY:  Ct Abdomen Pelvis W Contrast  Result Date: 01/13/2019 CLINICAL DATA:  Generalized abdominal pain EXAM: CT ABDOMEN AND PELVIS WITH CONTRAST TECHNIQUE: Multidetector CT imaging of the abdomen and pelvis was performed using the standard protocol following bolus administration of intravenous contrast. CONTRAST:  141mL OMNIPAQUE IOHEXOL 300 MG/ML  SOLN COMPARISON:  02/07/2018 FINDINGS: Lower chest: Cardiomegaly. Small pericardial effusion. No pleural effusions. Hepatobiliary: No focal hepatic abnormality. Gallbladder unremarkable. Pancreas: No focal abnormality or ductal dilatation. Spleen: No focal abnormality.  Normal size. Adrenals/Urinary Tract: No adrenal abnormality. No focal renal abnormality. No stones or hydronephrosis. Urinary bladder is unremarkable. Stomach/Bowel: Diffuse colonic diverticulosis. No active diverticulitis. Duodenal diverticula noted. No evidence of bowel obstruction. Stomach decompressed, grossly unremarkable. Vascular/Lymphatic: No evidence of aneurysm or adenopathy.  Reproductive: Calcified fibroids in the uterus.  No adnexal mass. Other: No free fluid or free air. Musculoskeletal: No acute bony abnormality. IMPRESSION: Diffuse colonic  diverticulosis.  No active diverticulitis. Cardiomegaly.  Small pericardial effusion. Calcified uterine fibroids. No acute findings in the abdomen or pelvis. Electronically Signed   By: Rolm Baptise M.D.   On: 01/13/2019 22:02   Dg Chest Portable 1 View  Result Date: 01/14/2019 CLINICAL DATA:  Somnolence and hypoxia EXAM: PORTABLE CHEST 1 VIEW COMPARISON:  10/07/2016 FINDINGS: Chronic cardiomegaly and interstitial prominence. There is no edema, consolidation, effusion, or pneumothorax. IMPRESSION: Stable from prior.  No acute finding. Electronically Signed   By: Monte Fantasia M.D.   On: 01/14/2019 05:30     ASSESSMENT AND PLAN:   73 year old female with past medical history of developmental delay, asthma, essential hypertension, recent admission for suspected rectal bleeding due to diverticulosis who presents to the hospital due to abdominal pain.  1.  Abdominal pain-nausea-etiology unclear presently.  Patient presented to the hospital with the above complaints and underwent a CT of the abdomen pelvis which showed diverticulosis but no other pathology.  Patient's LFTs and lipase level are normal. - Continue supportive care with antiemetics and pain control. - If needed will consider gastroenterology consult. - placed on clear liquid and will add some IV protonix.    2. Essential HTN - cont. Hydralazine.   3. Hypokalemia - will supplement and repeat in a.m.   All the records are reviewed and case discussed with Care Management/Social Worker. Management plans discussed with the patient, family and they are in agreement.  CODE STATUS: Full code  DVT Prophylaxis: Lovenox  TOTAL TIME TAKING CARE OF THIS PATIENT: 30 minutes.   POSSIBLE D/C IN 1-2 DAYS, DEPENDING ON CLINICAL CONDITION.   Henreitta Leber M.D on 01/14/2019  at 1:18 PM  Between 7am to 6pm - Pager - [phone]  After 6pm go to [email or web address] - Proofreader  [facility]  Office  [phone]  CC: Primary care physician; [name], MD

## 2019-01-14 NOTE — ED Notes (Signed)
PT lethargic and having to be told many times to sit up for orthostatic vitals. PT still states she is dizzy when asked.

## 2019-01-15 ENCOUNTER — Observation Stay: Payer: Medicare Other

## 2019-01-15 DIAGNOSIS — K5730 Diverticulosis of large intestine without perforation or abscess without bleeding: Secondary | ICD-10-CM | POA: Diagnosis present

## 2019-01-15 DIAGNOSIS — Z8719 Personal history of other diseases of the digestive system: Secondary | ICD-10-CM | POA: Diagnosis not present

## 2019-01-15 DIAGNOSIS — K219 Gastro-esophageal reflux disease without esophagitis: Secondary | ICD-10-CM | POA: Diagnosis present

## 2019-01-15 DIAGNOSIS — G4733 Obstructive sleep apnea (adult) (pediatric): Secondary | ICD-10-CM | POA: Diagnosis present

## 2019-01-15 DIAGNOSIS — R1084 Generalized abdominal pain: Secondary | ICD-10-CM | POA: Diagnosis present

## 2019-01-15 DIAGNOSIS — E669 Obesity, unspecified: Secondary | ICD-10-CM | POA: Diagnosis present

## 2019-01-15 DIAGNOSIS — I10 Essential (primary) hypertension: Secondary | ICD-10-CM | POA: Diagnosis present

## 2019-01-15 DIAGNOSIS — Z6841 Body Mass Index (BMI) 40.0 and over, adult: Secondary | ICD-10-CM | POA: Diagnosis not present

## 2019-01-15 DIAGNOSIS — R6250 Unspecified lack of expected normal physiological development in childhood: Secondary | ICD-10-CM | POA: Diagnosis present

## 2019-01-15 DIAGNOSIS — Z20828 Contact with and (suspected) exposure to other viral communicable diseases: Secondary | ICD-10-CM | POA: Diagnosis present

## 2019-01-15 DIAGNOSIS — Z79899 Other long term (current) drug therapy: Secondary | ICD-10-CM | POA: Diagnosis not present

## 2019-01-15 DIAGNOSIS — E876 Hypokalemia: Secondary | ICD-10-CM | POA: Diagnosis present

## 2019-01-15 MED ORDER — ENOXAPARIN SODIUM 40 MG/0.4ML ~~LOC~~ SOLN
40.0000 mg | Freq: Two times a day (BID) | SUBCUTANEOUS | Status: DC
  Administered 2019-01-15 – 2019-01-16 (×3): 40 mg via SUBCUTANEOUS
  Filled 2019-01-15 (×3): qty 0.4

## 2019-01-15 MED ORDER — METOCLOPRAMIDE HCL 5 MG/ML IJ SOLN
5.0000 mg | Freq: Four times a day (QID) | INTRAMUSCULAR | Status: DC
  Administered 2019-01-15 – 2019-01-16 (×5): 5 mg via INTRAVENOUS
  Filled 2019-01-15 (×5): qty 2

## 2019-01-15 NOTE — Evaluation (Signed)
Occupational Therapy Evaluation Patient Details Name: Taylor Johnson MRN: [id number] DOB: [birth year] Today's Date: 01/15/2019    History of Present Illness Pt admitted for complaints of weakness with nausea/vomiting. History includes developmental delay, asthma, and HTN. Pt with recent admission for rectal bleed.   Clinical Impression   Pt seen for OT evaluation this date. Pt pleasant and agreeable to participation. Pt seated in recliner. Poor historian at baseline, most PLOF/home info pulled from chart review. Pt reports she does not drive and her sister from [place] drives her. Pt reports her other sister who comes from [facility] comes to the house Sun-Thurs to assist pt's mother. Pt reports she helps her mother with emptying the BSC. Pt demonstrates impairments in BLE strength and activity tolerance. Pt denies pain or dizziness. Supervision to [facility] for toilet transfers and functional mobility with RW. Pt appears near baseline for functional ADL tasks. Would benefit from intermittent supervision for safety. OT will sign off at this time. Please re-consult if additional needs arise.     Follow Up Recommendations  No OT follow up;Supervision - Intermittent    Equipment Recommendations  None recommended by OT    Recommendations for Other Services       Precautions / Restrictions Precautions Precautions: Fall Restrictions Weight Bearing Restrictions: No      Mobility Bed Mobility             General bed mobility comments: deferred, up in recliner  Transfers Overall transfer level: Needs assistance Equipment used: Rolling walker (2 wheeled) Transfers: Sit to/from Stand Sit to Stand: Min guard         General transfer comment: cues to push from seated surface. Once standing, reports slight dizziness    Balance Overall balance assessment: Needs assistance Sitting-balance support: Feet supported Sitting balance-Leahy Scale: Good     Standing balance support:  Bilateral upper extremity supported Standing balance-Leahy Scale: Good                             ADL either performed or assessed with clinical judgement   ADL Overall ADL's : Modified independent                                       General ADL Comments: pt appears to be close to baseline for ADL tasks, supervision to CGA level for functional transfers during ADL tasks     Vision Patient Visual Report: No change from baseline       Perception     Praxis      Pertinent Vitals/Pain Pain Assessment: No/denies pain     Hand Dominance     Extremity/Trunk Assessment Upper Extremity Assessment Upper Extremity Assessment: Overall WFL for tasks assessed   Lower Extremity Assessment Lower Extremity Assessment: Generalized weakness   Cervical / Trunk Assessment Cervical / Trunk Assessment: Normal   Communication Communication Communication: No difficulties   Cognition Arousal/Alertness: Awake/alert Behavior During Therapy: WFL for tasks assessed/performed Overall Cognitive Status: Within Functional Limits for tasks assessed                                     General Comments       Exercises Exercises: Other exercises Other Exercises Other Exercises: pt educated in use of reacher to assist  with safety during seated LB dressing tasks, if she experiences abdominal discomfort with bending   Shoulder Instructions      Home Living Family/patient expects to be discharged to:: Private residence Living Arrangements: Parent Available Help at Discharge: Family(sister stays Sun-Thurs to help care for her mom) Type of Home: House Home Access: Ramped entrance     [place]: One [place]: [place] - single point          Prior Functioning/Environment Level of Independence: Independent        Comments: walks to her mailbox frequently, occasional use of SPC. Vague with asked about falls  history, unsure of last fall        OT Problem List: Decreased strength;Pain      OT Treatment/Interventions:      OT Goals(Current goals can be found in the care plan section) Acute Rehab OT Goals Patient Stated Goal: to go home OT Goal Formulation: All assessment and education complete, DC therapy  OT Frequency:     Barriers to D/C:            Co-evaluation              AM-PAC OT "6 Clicks" Daily Activity     Outcome Measure Help from another person eating meals?: None Help from another person taking care of personal grooming?: None Help from another person toileting, which includes using toliet, bedpan, or urinal?: A Little Help from another person bathing (including washing, rinsing, drying)?: A Little Help from another person to put on and taking off regular upper body clothing?: None Help from another person to put on and taking off regular lower body clothing?: A Little 6 Click Score: 21   End of Session    Activity Tolerance: Patient tolerated treatment well Patient left: in chair;with call bell/phone within reach;with chair alarm set  OT Visit Diagnosis: Other abnormalities of gait and mobility (R26.89)                Time: 7096-2836 OT Time Calculation (min): 13 min Charges:  OT General Charges $OT Visit: 1 Visit OT Evaluation $OT Eval Low Complexity: 1 Low  Jeni Salles, MPH, MS, OTR/L ascom [phone] 01/15/19, 2:23 PM

## 2019-01-15 NOTE — Progress Notes (Signed)
Patient stating she vomits within a couple of minutes with any oral intake. MD made aware, patient is now NPO and Reglan scheduled to administer. Madlyn Frankel, RN

## 2019-01-15 NOTE — Progress Notes (Signed)
PT Cancellation Note  Patient Details Name: Taylor Johnson MRN: [id number] DOB: 09-Mar-1946   Cancelled Treatment:    Reason Eval/Treat Not Completed: Other (comment). Pt currently out of room for imaging. Will re-attempt.   [name] 01/15/2019, 10:16 AM  Greggory Stallion, PT, DPT [phone]

## 2019-01-15 NOTE — Evaluation (Signed)
Physical Therapy Evaluation Patient Details Name: JACQUES WILLINGHAM MRN: [id number] DOB: 1946-01-12 Today's Date: 01/15/2019   History of Present Illness  Pt admitted for complaints of weakness with nausea/vomiting. History includes developmental delay, asthma, and HTN. Pt with recent admission for rectal bleed.  Clinical Impression  Pt is a pleasant 73 year old female who was admitted for weakness. Pt performs bed mobility with mod I, transfers with cga, and ambulation with cga using RW. Further ambulation deferred secondary to dizziness symptoms. Per RN, pt has been very nauseated this date, however appears to be improving at this time. Pt demonstrates deficits with strength/mobility. Encouraged to continue mobility efforts during hospitalization to reduce muscle atrophy. Would benefit from skilled PT to address above deficits and promote optimal return to PLOF. Recommend transition to [place] upon discharge from acute hospitalization.     Follow Up Recommendations Home health PT    Equipment Recommendations  Rolling walker with 5" wheels    Recommendations for Other Services       Precautions / Restrictions Precautions Precautions: Fall Restrictions Weight Bearing Restrictions: No      Mobility  Bed Mobility Overal bed mobility: Modified Independent             General bed mobility comments: safe technique with use of rails  Transfers Overall transfer level: Needs assistance Equipment used: Rolling walker (2 wheeled) Transfers: Sit to/from Stand Sit to Stand: Min guard         General transfer comment: cues to push from seated surface. Once standing, reports slight dizziness  Ambulation/Gait Ambulation/Gait assistance: Min guard Gait Distance (Feet): 8 Feet Assistive device: Rolling walker (2 wheeled) Gait Pattern/deviations: Step-to pattern     General Gait Details: ambulated to recliner with RW. Safe technique, however limited by dizziness. No nausea.. Deferred  further ambulation secondary to symptoms.  Stairs            Wheelchair Mobility    Modified Rankin (Stroke Patients Only)       Balance Overall balance assessment: Needs assistance Sitting-balance support: Feet supported Sitting balance-Leahy Scale: Good     Standing balance support: Bilateral upper extremity supported Standing balance-Leahy Scale: Good                               Pertinent Vitals/Pain Pain Assessment: No/denies pain    Home Living Family/patient expects to be discharged to:: Private residence Living Arrangements: Parent Available Help at Discharge: Family(sister stays Sun-Thurs to help care for her mom) Type of Home: House Home Access: Ramped entrance     Home Layout: One level Home Equipment: Cane - single point      Prior Function Level of Independence: Independent         Comments: walks to her mailbox frequently, occasional use of SPC. Vague with asked about falls history, unsure of last fall     Hand Dominance        Extremity/Trunk Assessment   Upper Extremity Assessment Upper Extremity Assessment: Overall WFL for tasks assessed    Lower Extremity Assessment Lower Extremity Assessment: Generalized weakness(B LE grossly 4/5)       Communication   Communication: No difficulties  Cognition Arousal/Alertness: Awake/alert Behavior During Therapy: WFL for tasks assessed/performed Overall Cognitive Status: Within Functional Limits for tasks assessed  General Comments      Exercises Other Exercises Other Exercises: Seated ther-ex performed including hip ab/ad, heel slides, and AP. 10 reps with cga.   Assessment/Plan    PT Assessment Patient needs continued PT services  PT Problem List Decreased strength;Decreased balance;Decreased knowledge of use of DME       PT Treatment Interventions Gait training;DME instruction;Therapeutic exercise;Balance  training    PT Goals (Current goals can be found in the Care Plan section)  Acute Rehab PT Goals Patient Stated Goal: to go home PT Goal Formulation: With patient Time For Goal Achievement: 01/29/19 Potential to Achieve Goals: Good    Frequency Min 2X/week   Barriers to discharge        Co-evaluation               AM-PAC PT "6 Clicks" Mobility  Outcome Measure Help needed turning from your back to your side while in a flat bed without using bedrails?: None Help needed moving from lying on your back to sitting on the side of a flat bed without using bedrails?: None Help needed moving to and from a bed to a chair (including a wheelchair)?: A Little Help needed standing up from a chair using your arms (e.g., wheelchair or bedside chair)?: A Little Help needed to walk in hospital room?: A Little Help needed climbing 3-5 steps with a railing? : A Lot 6 Click Score: 19    End of Session Equipment Utilized During Treatment: Gait belt Activity Tolerance: Patient tolerated treatment well Patient left: in chair;with chair alarm set Nurse Communication: Mobility status PT Visit Diagnosis: Muscle weakness (generalized) (M62.81);History of falling (Z91.81)    Time: 2952-8413 PT Time Calculation (min) (ACUTE ONLY): 18 min   Charges:   PT Evaluation $PT Eval Low Complexity: 1 Low PT Treatments $Therapeutic Exercise: 8-22 mins        Greggory Stallion, PT, DPT [phone]   Makana Feigel 01/15/2019, 1:41 PM

## 2019-01-15 NOTE — Progress Notes (Signed)
PHARMACIST - PHYSICIAN COMMUNICATION  CONCERNING:  Enoxaparin (Lovenox) for DVT Prophylaxis    RECOMMENDATION: Patient was prescribed enoxaprin 40mg  q24 hours for VTE prophylaxis.   Filed Weights   01/14/19 0948 01/14/19 1114 01/15/19 0500  Weight: 258 lb 6.4 oz (117.2 kg) 258 lb 6.1 oz (117.2 kg) 265 lb 6.9 oz (120.4 kg)    Body mass index is 45.56 kg/m.  Estimated Creatinine Clearance: 74.7 mL/min (by C-G formula based on SCr of 0.87 mg/dL).   Based on [place] patient is candidate for enoxaparin 40mg  every 12 hour dosing due to BMI being >40.  DESCRIPTION: Pharmacy has adjusted enoxaparin dose per [facility] policy.  Patient is now receiving enoxaparin 40mg  every 12 hours.    Tawnya Crook, PharmD Clinical Pharmacist  01/15/2019 9:44 AM

## 2019-01-15 NOTE — Care Management Obs Status (Signed)
[facility] NOTIFICATION   Patient Details  Name: Taylor Johnson MRN: [id number] Date of Birth: 1945-12-22   Medicare Observation Status Notification Given:  Yes    Shade Flood, LCSW 01/15/2019, 10:21 AM

## 2019-01-15 NOTE — Progress Notes (Addendum)
[place] [facility] [place] NAME: Taylor Johnson    MR#:  [id number]  DATE OF BIRTH:  June 01, 1946  SUBJECTIVE:   Patient having persistent nausea overnight into this morning.  Started on some IV Reglan.  Upper GI series obtained this morning showing no evidence of acute pathology.  REVIEW OF SYSTEMS:    Review of Systems  Constitutional: Negative for chills and fever.  HENT: Negative for congestion and tinnitus.   Eyes: Negative for blurred vision and double vision.  Respiratory: Negative for cough, shortness of breath and wheezing.   Cardiovascular: Negative for chest pain, orthopnea and PND.  Gastrointestinal: Positive for nausea. Negative for abdominal pain, diarrhea and vomiting.  Genitourinary: Negative for dysuria and hematuria.  Neurological: Negative for dizziness, sensory change and focal weakness.  All other systems reviewed and are negative.   Nutrition: soft diet Tolerating Diet: Yes some Tolerating PT: Await Eval.   DRUG ALLERGIES:  No Known Allergies  VITALS:  Blood pressure (!) 147/58, pulse 76, temperature 98.7 F (37.1 C), temperature source Oral, resp. rate 20, height 5\' 4"  (1.626 m), weight 120.4 kg, SpO2 91 %.  PHYSICAL EXAMINATION:   Physical Exam  GENERAL:  73 y.o.-year-old obese patient sitting in chair in no acute distress.  EYES: Pupils equal, round, reactive to light and accommodation. No scleral icterus. Extraocular muscles intact.  HEENT: Head atraumatic, normocephalic. Oropharynx and nasopharynx clear.  NECK:  Supple, no jugular venous distention. No thyroid enlargement, no tenderness.  LUNGS: Normal breath sounds bilaterally, no wheezing, rales, rhonchi. No use of accessory muscles of respiration.  CARDIOVASCULAR: S1, S2 normal. No murmurs, rubs, or gallops.  ABDOMEN: Soft, nontender, nondistended. Bowel sounds present. No organomegaly or mass.  EXTREMITIES: No cyanosis, clubbing or edema b/l.     NEUROLOGIC: Cranial nerves II through XII are intact. No focal Motor or sensory deficits b/l.   PSYCHIATRIC: The patient is alert and oriented x 3.  SKIN: No obvious rash, lesion, or ulcer.    LABORATORY PANEL:   CBC Recent Labs  Lab 01/13/19 2000  WBC 5.0  HGB 11.0*  HCT 36.8  PLT 234   ------------------------------------------------------------------------------------------------------------------  Chemistries  Recent Labs  Lab 01/13/19 2000  NA 141  K 3.2*  CL 103  CO2 29  GLUCOSE 108*  BUN 12  CREATININE 0.87  CALCIUM 9.0  AST 13*  ALT 9  ALKPHOS 105  BILITOT 0.4   ------------------------------------------------------------------------------------------------------------------  Cardiac Enzymes No results for input(s): TROPONINI in the last 168 hours. ------------------------------------------------------------------------------------------------------------------  RADIOLOGY:  Ct Abdomen Pelvis W Contrast  Result Date: 01/13/2019 CLINICAL DATA:  Generalized abdominal pain EXAM: CT ABDOMEN AND PELVIS WITH CONTRAST TECHNIQUE: Multidetector CT imaging of the abdomen and pelvis was performed using the standard protocol following bolus administration of intravenous contrast. CONTRAST:  123mL OMNIPAQUE IOHEXOL 300 MG/ML  SOLN COMPARISON:  02/07/2018 FINDINGS: Lower chest: Cardiomegaly. Small pericardial effusion. No pleural effusions. Hepatobiliary: No focal hepatic abnormality. Gallbladder unremarkable. Pancreas: No focal abnormality or ductal dilatation. Spleen: No focal abnormality.  Normal size. Adrenals/Urinary Tract: No adrenal abnormality. No focal renal abnormality. No stones or hydronephrosis. Urinary bladder is unremarkable. Stomach/Bowel: Diffuse colonic diverticulosis. No active diverticulitis. Duodenal diverticula noted. No evidence of bowel obstruction. Stomach decompressed, grossly unremarkable. Vascular/Lymphatic: No evidence of aneurysm or adenopathy.  Reproductive: Calcified fibroids in the uterus.  No adnexal mass. Other: No free fluid or free air. Musculoskeletal: No acute bony abnormality. IMPRESSION: Diffuse colonic diverticulosis.  No active diverticulitis.  Cardiomegaly.  Small pericardial effusion. Calcified uterine fibroids. No acute findings in the abdomen or pelvis. Electronically Signed   By: Rolm Baptise M.D.   On: 01/13/2019 22:02   Dg Chest Portable 1 View  Result Date: 01/14/2019 CLINICAL DATA:  Somnolence and hypoxia EXAM: PORTABLE CHEST 1 VIEW COMPARISON:  10/07/2016 FINDINGS: Chronic cardiomegaly and interstitial prominence. There is no edema, consolidation, effusion, or pneumothorax. IMPRESSION: Stable from prior.  No acute finding. Electronically Signed   By: Monte Fantasia M.D.   On: 01/14/2019 05:30   Dg Duanne Limerick W Double Cm (hd Ba)  Result Date: 01/15/2019 CLINICAL DATA:  Past medical history of diverticulitis. Began vomiting yesterday with abdominal pain EXAM: UPPER GI SERIES WITHOUT KUB TECHNIQUE: Routine upper GI series was performed with thin/high density/water soluble barium. FLUOROSCOPY TIME:  Fluoroscopy Time:  1.5 minute Radiation Exposure Index (if provided by the fluoroscopic device): 28.9 mGy Number of Acquired Spot Images: 0 COMPARISON:  None. FINDINGS: Examination of the esophagus demonstrated normal esophageal motility. Normal esophageal morphology without evidence of esophagitis or ulceration. No esophageal stricture, diverticula, or mass lesion. No evidence of hiatal hernia. Mild gastroesophageal reflux. Examination of the stomach demonstrated normal rugal folds and areae gastricae. The gastric mucosa appeared unremarkable without evidence of ulceration, scarring, or mass lesion. Gastric motility and emptying was normal. Fluoroscopic examination of the duodenum demonstrates normal motility and morphology without evidence of ulceration or mass lesion. IMPRESSION: 1. Mild gastroesophageal reflux. 2. Otherwise unremarkable  upper GI. Electronically Signed   By: Kathreen Devoid   On: 01/15/2019 10:21     ASSESSMENT AND PLAN:   73 year old female with past medical history of developmental delay, asthma, essential hypertension, recent admission for suspected rectal bleeding due to diverticulosis who presents to the hospital due to abdominal pain.  1.  Abdominal pain-nausea-etiology unclear presently.  Patient presented to the hospital with the above complaints and underwent a CT of the abdomen pelvis which showed diverticulosis but no other pathology.  Patient's LFTs and lipase level are normal. -Patient continued to have persistent nausea and therefore obtained upper GI series which was negative for acute pathology other than some mild reflux. Cont. IV protonix -Started on some scheduled Reglan, and will continue to monitor. - If needed will consider gastroenterology consult.  2. Essential HTN - cont. Hydralazine.   3. Hypokalemia - will repeat in a.m. tomorrow.    All the records are reviewed and case discussed with Care Management/Social Worker. Management plans discussed with the patient, family and they are in agreement.  CODE STATUS: Full code  DVT Prophylaxis: Lovenox  TOTAL TIME TAKING CARE OF THIS PATIENT: 30 minutes.   POSSIBLE D/C IN 1-2 DAYS, DEPENDING ON CLINICAL CONDITION.   Henreitta Leber M.D on 01/15/2019 at 2:35 PM  Between 7am to 6pm - Pager - [phone]  After 6pm go to [email or web address] - Proofreader  [facility]  Office  [phone]  CC: Primary care physician; [name], MD

## 2019-01-15 NOTE — Progress Notes (Signed)
Patient able to tolerate diet, some mild nausea and no vomiting. Madlyn Frankel, RN

## 2019-01-16 LAB — POTASSIUM: Potassium: 4.7 mmol/L (ref 3.5–5.1)

## 2019-01-16 MED ORDER — PANTOPRAZOLE SODIUM 40 MG PO TBEC: 40.0000 mg | DELAYED_RELEASE_TABLET | Freq: Every day | ORAL | 0 refills | Status: DC

## 2019-01-16 MED ORDER — METOCLOPRAMIDE HCL 10 MG PO TABS: 10.0000 mg | ORAL_TABLET | Freq: Three times a day (TID) | ORAL | 0 refills | Status: DC | PRN

## 2019-01-16 NOTE — Discharge Instructions (Signed)
HHPT °

## 2019-01-16 NOTE — Progress Notes (Signed)
Pt d/c to home via sister. IV removed intact. VSS. Education completed with pt and sister. All belongings sent with pt. All questions answered.

## 2019-01-16 NOTE — Discharge Summary (Signed)
[facility] NAME: Taylor Johnson    MR#:  [id number]  DATE OF BIRTH:  04/25/1946  DATE OF ADMISSION:  01/13/2019   ADMITTING PHYSICIAN: Harrie Foreman, MD  DATE OF DISCHARGE: 01/16/2019 PRIMARY CARE PHYSICIAN: [name], MD   ADMISSION DIAGNOSIS:  Dizziness [R42] Bradycardia [R00.1] Generalized abdominal pain [R10.84] Generalized weakness [R53.1] DISCHARGE DIAGNOSIS:  Active Problems:   Weakness  SECONDARY DIAGNOSIS:   Past Medical History:  Diagnosis Date  . Abdominal pain 01/27/2017  . Anxiety   . Anxiety, generalized 05/11/2015  . Asthma   . Cough 06/02/2016   Chronic - followed by Pulmonary  . Depression   . Developmental delay   . Diverticulitis   . Diverticulosis of colon 05/11/2015  . Dysphagia 10/15/2015   Routine Ba Swallow normal; rec modified barium swallow   . Hypoxia 09/12/2015  . Leg swelling   . Localized edema 04/15/2016  . Microscopic hematuria 04/15/2016  . Nausea and vomiting 01/29/2017  . Orthostatic hypotension 03/16/2017  . OSA on CPAP 11/18/2015   CPAP @ 18 cm H2O started 02/2016  . Rectal bleeding 10/15/2018  . Urge incontinence of urine 05/12/2015   HOSPITAL COURSE:  73 year old female with past medical history of developmental delay, asthma, essential hypertension, recent admission for suspected rectal bleeding due to diverticulosis who presents to the hospital due to abdominal pain.  1.  Abdominal pain-nausea-etiology unclear presently.  Patient presented to the hospital with the above complaints and underwent a CT of the abdomen pelvis which showed diverticulosis but no other pathology.  Patient's LFTs and lipase level are normal. -Patient continued to have persistent nausea and therefore obtained upper GI series which was negative for acute pathology other than some mild reflux.   She is treated with IV protonix, changed to p.o.  Follow-up PCP. -Started on some scheduled Reglan, the  patient feels better. 2. Essential HTN - cont. Hydralazine.   3. Hypokalemia - improved. Generalized weakness.  PT evaluation suggest PT and home health. DISCHARGE CONDITIONS:  Stable, discharge to home with home health and PT today. CONSULTS OBTAINED:   DRUG ALLERGIES:  No Known Allergies DISCHARGE MEDICATIONS:   Allergies as of 01/16/2019   No Known Allergies     Medication List    TAKE these medications   hydrALAZINE 25 MG tablet Commonly known as: APRESOLINE Take 1 tablet (25 mg total) by mouth 3 (three) times daily.   metoCLOPramide 10 MG tablet Commonly known as: REGLAN Take 1 tablet (10 mg total) by mouth every 8 (eight) hours as needed for up to 7 days for nausea, vomiting or refractory nausea / vomiting.   pantoprazole 40 MG tablet Commonly known as: Protonix Take 1 tablet (40 mg total) by mouth daily.   vitamin B-12 500 MCG tablet Commonly known as: CYANOCOBALAMIN Take 500 mcg by mouth daily.            Durable Medical Equipment  (From admission, onward)         Start     Ordered   01/16/19 0915  For home use only DME Walker rolling  Once    Question:  Patient needs a walker to treat with the following condition  Answer:  Weakness generalized   01/16/19 0914           DISCHARGE INSTRUCTIONS:  See AVS. If you experience worsening of your admission symptoms, develop shortness of breath, life threatening emergency, suicidal or homicidal thoughts you  must seek medical attention immediately by calling 911 or calling your MD immediately  if symptoms less severe.  You Must read complete instructions/literature along with all the possible adverse reactions/side effects for all the Medicines you take and that have been prescribed to you. Take any new Medicines after you have completely understood and accpet all the possible adverse reactions/side effects.   Please note  You were cared for by a hospitalist during your hospital stay. If you have any  questions about your discharge medications or the care you received while you were in the hospital after you are discharged, you can call the unit and asked to speak with the hospitalist on call if the hospitalist that took care of you is not available. Once you are discharged, your primary care physician will handle any further medical issues. Please note that NO REFILLS for any discharge medications will be authorized once you are discharged, as it is imperative that you return to your primary care physician (or establish a relationship with a primary care physician if you do not have one) for your aftercare needs so that they can reassess your need for medications and monitor your lab values.    On the day of Discharge:  VITAL SIGNS:  Blood pressure (!) 162/80, pulse 75, temperature 98 F (36.7 C), temperature source Axillary, resp. rate 20, height 5\' 4"  (1.626 m), weight 122.2 kg, SpO2 92 %. PHYSICAL EXAMINATION:  GENERAL:  73 y.o.-year-old patient lying in the bed with no acute distress.  Morbid obesity. EYES: Pupils equal, round, reactive to light and accommodation. No scleral icterus. Extraocular muscles intact.  HEENT: Head atraumatic, normocephalic. Oropharynx and nasopharynx clear.  NECK:  Supple, no jugular venous distention. No thyroid enlargement, no tenderness.  LUNGS: Normal breath sounds bilaterally, no wheezing, rales,rhonchi or crepitation. No use of accessory muscles of respiration.  CARDIOVASCULAR: S1, S2 normal. No murmurs, rubs, or gallops.  ABDOMEN: Soft, non-tender, non-distended. Bowel sounds present. No organomegaly or mass.  EXTREMITIES: No pedal edema, cyanosis, or clubbing.  NEUROLOGIC: Cranial nerves II through XII are intact. Muscle strength 4/5 in all extremities. Sensation intact. Gait not checked.  PSYCHIATRIC: The patient is alert and oriented x 3.  SKIN: No obvious rash, lesion, or ulcer.  DATA REVIEW:   CBC Recent Labs  Lab 01/13/19 2000  WBC 5.0  HGB  11.0*  HCT 36.8  PLT 234    Chemistries  Recent Labs  Lab 01/13/19 2000 01/16/19 0536  NA 141  --   K 3.2* 4.7  CL 103  --   CO2 29  --   GLUCOSE 108*  --   BUN 12  --   CREATININE 0.87  --   CALCIUM 9.0  --   AST 13*  --   ALT 9  --   ALKPHOS 105  --   BILITOT 0.4  --      Microbiology Results  Results for orders placed or performed during the hospital encounter of 01/13/19  SARS Coronavirus 2 (CEPHEID - Performed in [facility] hospital lab), Hosp Order     Status: None   Collection Time: 01/14/19  6:05 AM   Specimen: Nasopharyngeal Swab  Result Value Ref Range Status   SARS Coronavirus 2 NEGATIVE NEGATIVE Final    Comment: (NOTE) If result is NEGATIVE SARS-CoV-2 target nucleic acids are NOT DETECTED. The SARS-CoV-2 RNA is generally detectable in upper and lower  respiratory specimens during the acute phase of infection. The lowest  concentration of SARS-CoV-2  viral copies this assay can detect is 250  copies / mL. A negative result does not preclude SARS-CoV-2 infection  and should not be used as the sole basis for treatment or other  patient management decisions.  A negative result may occur with  improper specimen collection / handling, submission of specimen other  than nasopharyngeal swab, presence of viral mutation(s) within the  areas targeted by this assay, and inadequate number of viral copies  (<250 copies / mL). A negative result must be combined with clinical  observations, patient history, and epidemiological information. If result is POSITIVE SARS-CoV-2 target nucleic acids are DETECTED. The SARS-CoV-2 RNA is generally detectable in upper and lower  respiratory specimens dur ing the acute phase of infection.  Positive  results are indicative of active infection with SARS-CoV-2.  Clinical  correlation with patient history and other diagnostic information is  necessary to determine patient infection status.  Positive results do  not rule out  bacterial infection or co-infection with other viruses. If result is PRESUMPTIVE POSTIVE SARS-CoV-2 nucleic acids MAY BE PRESENT.   A presumptive positive result was obtained on the submitted specimen  and confirmed on repeat testing.  While 2019 novel coronavirus  (SARS-CoV-2) nucleic acids may be present in the submitted sample  additional confirmatory testing may be necessary for epidemiological  and / or clinical management purposes  to differentiate between  SARS-CoV-2 and other Sarbecovirus currently known to infect humans.  If clinically indicated additional testing with an alternate test  methodology [phone]) is advised. The SARS-CoV-2 RNA is generally  detectable in upper and lower respiratory sp ecimens during the acute  phase of infection. The expected result is Negative. Fact Sheet for Patients:  StrictlyIdeas.no Fact Sheet for Healthcare Providers: BankingDealers.co.za This test is not yet approved or cleared by the [place] FDA and has been authorized for detection and/or diagnosis of SARS-CoV-2 by FDA under an Emergency Use Authorization (EUA).  This EUA will remain in effect (meaning this test can be used) for the duration of the COVID-19 declaration under Section 564(b)(1) of the Act, 21 U.S.C. section 360bbb-3(b)(1), unless the authorization is terminated or revoked sooner. Performed at [facility], [address], [place], [place] [address]     RADIOLOGY:  No results found.   Management plans discussed with the patient, family and they are in agreement.  CODE STATUS: Full Code   TOTAL TIME TAKING CARE OF THIS PATIENT: 32 minutes.    [name] M.D on 01/16/2019 at 11:21 AM  Between 7am to 6pm - Pager - [phone]  After 6pm go to [email or web address] - Proofreader  [facility]  Office  [phone]  CC: Primary care physician; [name], MD   Note:  This dictation was prepared with Dragon dictation along with smaller phrase technology. Any transcriptional errors that result from this process are unintentional.

## 2019-01-16 NOTE — TOC Transition Note (Signed)
Transition of Care [facility]) - CM/SW Discharge Note   Patient Details  Name: Taylor Johnson MRN: [id number] Date of Birth: [birth year]  [facility] CM/SW Contact:  Taylor Johnson, Taylor Johnson Phone Number: [phone]  01/16/2019, 10:03 AM   Clinical Narrative:  PT is recommending home health. Clinical Education officer, museum (CSW) contacted patient's guardian/ sister Taylor Johnson. 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 CSW explained that PT is recommending home health and patient will need a break from care giving when she comes home. Taylor Johnson verbalized her understanding and is agreeable to home health. CSW made Taylor Johnson aware that home health agencies are not accepting a lot of [facility] payers and the only agency the can accept patient for RN and PT services is [employer]. Sister is agreeable to [facility] and requested and rolling walker for patient. Taylor Johnson DME agency representative is aware of above. Per sister she will call [facility] when she is [place] the medical mall entrance to pick up patient today. Taylor Johnson representative is aware of D/C today. RN aware of above. Please reconsult if future social work needs arise. CSW signing off.     Final next level of care: Home w Home Health Services Barriers to Discharge: Barriers Resolved   Patient Goals and CMS Choice     Choice offered to / list presented to : [facility] POA / Guardian, Sibling  Discharge Placement                  Name of family member notified: Patient's sister Taylor Johnson is aware of D/C today. Patient and family notified of of transfer: 01/16/19  Discharge Plan and Services                DME Arranged: Taylor Johnson rolling DME Agency: Taylor Johnson Date DME Agency Contacted: 01/16/19 Time DME Agency Contacted: [phone] Representative spoke with at DME Agency: Taylor Johnson: PT, RN [place] Agency: Taylor Johnson Date [place]: 01/16/19 Time [place]:  0945 Representative spoke with at [place]: [place] ([place]) Interventions     Readmission Risk Interventions No flowsheet data found.

## 2019-01-17 ENCOUNTER — Telehealth: Payer: Self-pay

## 2019-01-17 NOTE — Telephone Encounter (Signed)
Transition Care Management Follow-up Telephone Call  Date of discharge and from where: 01/16/19 [facility]  How have you been since you were released from the hospital? Spoke to patient's sister [name], pt is doing okay, just still feeling weak.   Any questions or concerns? No   Items Reviewed:  Did the pt receive and understand the discharge instructions provided? Yes   Medications obtained and verified? Yes   Any new allergies since your discharge? No   Dietary orders reviewed? Yes  Do you have support at home? Yes   Functional Questionnaire: (I = Independent and D = Dependent) ADLs: independent w/assistance  Bathing/Dressing- D  Meal Prep- D  Eating- I  Maintaining continence- D  Transferring/Ambulation- Independent with walker  Managing Meds- D  Follow up appointments reviewed:   PCP Hospital f/u appt confirmed? Yes  Scheduled to see Dr. Army Melia on 01/21/19 @ 2:20.  Are transportation arrangements needed? No   If their condition worsens, is the pt aware to call PCP or go to the Emergency Dept.? Yes  Was the patient provided with contact information for the PCP's office or ED? Yes  Was to pt encouraged to call back with questions or concerns? Yes

## 2019-01-21 ENCOUNTER — Other Ambulatory Visit: Payer: Self-pay

## 2019-01-21 ENCOUNTER — Ambulatory Visit (INDEPENDENT_AMBULATORY_CARE_PROVIDER_SITE_OTHER): Payer: Medicare Other | Admitting: Internal Medicine

## 2019-01-21 ENCOUNTER — Encounter: Payer: Self-pay | Admitting: Internal Medicine

## 2019-01-21 VITALS — BP 132/78 | HR 68 | Ht 64.0 in | Wt 248.0 lb

## 2019-01-21 DIAGNOSIS — L03113 Cellulitis of right upper limb: Secondary | ICD-10-CM | POA: Diagnosis not present

## 2019-01-21 DIAGNOSIS — K2970 Gastritis, unspecified, without bleeding: Secondary | ICD-10-CM | POA: Diagnosis not present

## 2019-01-21 DIAGNOSIS — R030 Elevated blood-pressure reading, without diagnosis of hypertension: Secondary | ICD-10-CM | POA: Diagnosis not present

## 2019-01-21 MED ORDER — SULFAMETHOXAZOLE-TRIMETHOPRIM 800-160 MG PO TABS: 1.0000 | ORAL_TABLET | Freq: Two times a day (BID) | ORAL | 0 refills | Status: AC

## 2019-01-21 MED ORDER — METOCLOPRAMIDE HCL 5 MG PO TABS: 5.0000 mg | ORAL_TABLET | Freq: Three times a day (TID) | ORAL | 0 refills | Status: DC

## 2019-01-21 NOTE — Progress Notes (Signed)
Date:  01/21/2019   Name:  Taylor Johnson   DOB:  1945-08-11   MRN:  [id number]  Patient is here today with her sister Callie. Chief Complaint: Hospitalization Follow-up (Weakness. Doing better.) Hospital follow up. Admitted to [facility] 6/28 - 01/16/2019. 1. Abdominal pain-nausea-etiology unclear presently. Patient presented to the hospital with the above complaints and underwent a CT of the abdomen pelvis which showed diverticulosis but no other pathology. Patient's LFTs and lipase level are normal. -Patient continued to have persistent nausea and therefore obtained upper GI series which was negative for acute pathology other than some mild reflux.  She is treated with IV protonix, changed to p.o.  Follow-up PCP. -Started on some scheduled Reglan, the patient feels better. 2. Essential HTN - cont. Hydralazine.   3. Hypokalemia -improved. Generalized weakness.  PT evaluation suggest PT and home health.  EGD: IMPRESSION: 1. Mild gastroesophageal reflux. 2. Otherwise unremarkable upper GI.  Abdominal Pain This is a recurrent problem. The problem has been gradually improving. Pertinent negatives include no constipation, diarrhea, fever or headaches. She has tried proton pump inhibitors (and Reglan) for the symptoms. Prior diagnostic workup includes CT scan and upper endoscopy. There is no history of colon cancer or gallstones.  Hypertension This is a chronic problem. The problem is controlled. Pertinent negatives include no chest pain, headaches or shortness of breath. Past treatments include direct vasodilators. The current treatment provides moderate improvement.  Arm Pain  Incident location: may have been at sit of blood drawn. There was no injury mechanism (just above right elbow). The quality of the pain is described as burning. The pain does not radiate. The pain is mild. Pertinent negatives include no chest pain.    Review of Systems  Constitutional: Negative for chills, fatigue and  fever.  Respiratory: Negative for cough, chest tightness, shortness of breath and wheezing.   Cardiovascular: Positive for leg swelling. Negative for chest pain.  Gastrointestinal: Positive for abdominal pain. Negative for blood in stool, constipation and diarrhea.  Skin: Positive for color change.  Neurological: Negative for dizziness and headaches.  Psychiatric/Behavioral: Negative for sleep disturbance.    Patient Active Problem List   Diagnosis Date Noted  . Weakness 01/14/2019  . Elevated blood pressure reading 10/23/2018  . Functional systolic murmur 61/60/7371  . Developmental delay disorder 02/06/2017  . Calculus of gallbladder without cholecystitis without obstruction 01/20/2017  . Cough 06/02/2016  . Localized edema 04/15/2016  . Microscopic hematuria 04/15/2016  . OSA on CPAP 11/18/2015  . Dysphagia 10/15/2015  . Hypoxia 09/12/2015  . Urge incontinence of urine 05/12/2015  . Diverticulosis of colon 05/11/2015  . Anxiety, generalized 05/11/2015    No Known Allergies  Past Surgical History:  Procedure Laterality Date  . ABDOMINAL HYSTERECTOMY    . BREAST BIOPSY Left    neg  . COLON SURGERY    . ESOPHAGOGASTRODUODENOSCOPY (EGD) WITH PROPOFOL N/A 01/29/2017   Procedure: ESOPHAGOGASTRODUODENOSCOPY (EGD) WITH PROPOFOL;  Surgeon: Wilford Corner, MD;  Location: [facility] ENDOSCOPY;  Service: Endoscopy;  Laterality: N/A;    Social History   Tobacco Use  . Smoking status: Never Smoker  . Smokeless tobacco: Never Used  . Tobacco comment: smoking cessation materials not required  Substance Use Topics  . Alcohol use: No    Alcohol/week: 0.0 standard drinks  . Drug use: No     Medication list has been reviewed and updated.  Current Meds  Medication Sig  . hydrALAZINE (APRESOLINE) 25 MG tablet Take 1 tablet (25 mg  total) by mouth 3 (three) times daily.  . metoCLOPramide (REGLAN) 10 MG tablet Take 1 tablet (10 mg total) by mouth every 8 (eight) hours as needed for up  to 7 days for nausea, vomiting or refractory nausea / vomiting.  . pantoprazole (PROTONIX) 40 MG tablet Take 1 tablet (40 mg total) by mouth daily.  . vitamin B-12 (CYANOCOBALAMIN) 500 MCG tablet Take 500 mcg by mouth daily.    PHQ 2/9 Scores 01/21/2019 10/23/2018 03/05/2018 06/02/2016  PHQ - 2 Score 0 0 2 0  PHQ- 9 Score - - 6 -    BP Readings from Last 3 Encounters:  01/21/19 132/78  01/16/19 (!) 162/80  10/23/18 118/70    Physical Exam Vitals signs and nursing note reviewed.  Constitutional:      General: She is not in acute distress.    Appearance: She is well-developed.  HENT:     Head: Normocephalic and atraumatic.  Neck:     Musculoskeletal: Normal range of motion.  Cardiovascular:     Rate and Rhythm: Normal rate and regular rhythm.     Pulses: Normal pulses.     Heart sounds: No murmur.  Pulmonary:     Effort: Pulmonary effort is normal. No respiratory distress.  Abdominal:     General: Bowel sounds are normal.     Palpations: Abdomen is soft.     Tenderness: There is abdominal tenderness. There is no guarding or rebound.  Musculoskeletal: Normal range of motion.     Right lower leg: No edema.     Left lower leg: No edema.  Skin:    General: Skin is warm and dry.     Capillary Refill: Capillary refill takes less than 2 seconds.     Findings: No rash.          Comments: Raised red lesion on forearm with blister ~ 0.5 cm Red tender swollen area above elbow on inner arm - 3x2 cm  Neurological:     Mental Status: She is alert and oriented to person, place, and time.  Psychiatric:        Attention and Perception: Attention normal.        Behavior: Behavior normal.        Thought Content: Thought content normal.     Wt Readings from Last 3 Encounters:  01/21/19 248 lb (112.5 kg)  01/16/19 269 lb 6.4 oz (122.2 kg)  10/23/18 253 lb (114.8 kg)    BP 132/78   Pulse 68   Ht 5\' 4"  (1.626 m)   Wt 248 lb (112.5 kg)   SpO2 95%   BMI 42.57 kg/m   Assessment  and Plan: 1. Gastritis without bleeding, unspecified chronicity, unspecified gastritis type Continue protonix Change Reglan to 5 mg tid with meals Schedule follow up with Dr. Marius Ditch - metoCLOPramide (REGLAN) 5 MG tablet; Take 1 tablet (5 mg total) by mouth 3 (three) times daily before meals.  Dispense: 90 tablet; Refill: 0  2. Elevated blood pressure reading Controlled on Hydralazine  3. Cellulitis of right upper extremity - sulfamethoxazole-trimethoprim (BACTRIM DS) 800-160 MG tablet; Take 1 tablet by mouth 2 (two) times daily for 10 days.  Dispense: 20 tablet; Refill: 0   Partially dictated using Editor, commissioning. Any errors are unintentional.  Halina Maidens, MD [facility]  01/21/2019

## 2019-01-25 ENCOUNTER — Other Ambulatory Visit: Payer: Self-pay | Admitting: Internal Medicine

## 2019-01-25 ENCOUNTER — Encounter: Payer: Self-pay | Admitting: Internal Medicine

## 2019-01-25 DIAGNOSIS — R030 Elevated blood-pressure reading, without diagnosis of hypertension: Secondary | ICD-10-CM

## 2019-01-25 DIAGNOSIS — R1310 Dysphagia, unspecified: Secondary | ICD-10-CM

## 2019-01-25 DIAGNOSIS — Z9989 Dependence on other enabling machines and devices: Secondary | ICD-10-CM

## 2019-01-25 DIAGNOSIS — F411 Generalized anxiety disorder: Secondary | ICD-10-CM

## 2019-01-25 DIAGNOSIS — K5730 Diverticulosis of large intestine without perforation or abscess without bleeding: Secondary | ICD-10-CM

## 2019-01-25 DIAGNOSIS — G4733 Obstructive sleep apnea (adult) (pediatric): Secondary | ICD-10-CM

## 2019-01-25 DIAGNOSIS — N3941 Urge incontinence: Secondary | ICD-10-CM

## 2019-01-25 NOTE — Progress Notes (Signed)
Received orders from [facility]. Start of care 01/18/2019.  Certification period 73/2020 through 03/18/2019. Orders are reviewed, signed and faxed.

## 2019-01-28 ENCOUNTER — Inpatient Hospital Stay: Payer: Medicare Other | Admitting: Internal Medicine

## 2019-01-31 LAB — FERRITIN: Ferritin: 69 ng/mL (ref 15–150)

## 2019-01-31 LAB — H. PYLORI BREATH TEST: H pylori Breath Test: NEGATIVE

## 2019-01-31 LAB — CBC
Hematocrit: 38.6 % (ref 34.0–46.6)
Hemoglobin: 12 g/dL (ref 11.1–15.9)
MCH: 26.6 pg (ref 26.6–33.0)
MCHC: 31.1 g/dL — ABNORMAL LOW (ref 31.5–35.7)
MCV: 86 fL (ref 79–97)
Platelets: 265 10*3/uL (ref 150–450)
RBC: 4.51 x10E6/uL (ref 3.77–5.28)
RDW: 14.5 % (ref 11.7–15.4)
WBC: 4.9 10*3/uL (ref 3.4–10.8)

## 2019-01-31 LAB — B12 AND FOLATE PANEL
Folate: 3.1 ng/mL (ref >3.0)
Vitamin B-12: 579 pg/mL (ref 232–1245)

## 2019-02-04 ENCOUNTER — Observation Stay
Admission: EM | Admit: 2019-02-04 | Discharge: 2019-02-06 | Disposition: A | Discharge status: Home / Self Care | Payer: Medicare Other | Attending: Internal Medicine | Admitting: Internal Medicine

## 2019-02-04 ENCOUNTER — Ambulatory Visit (INDEPENDENT_AMBULATORY_CARE_PROVIDER_SITE_OTHER)
Admission: EM | Admit: 2019-02-04 | Discharge: 2019-02-04 | Disposition: A | Discharge status: Home / Self Care | Payer: Medicare Other | Source: Home / Self Care | Attending: Family Medicine | Admitting: Family Medicine

## 2019-02-04 ENCOUNTER — Emergency Department: Payer: Medicare Other

## 2019-02-04 ENCOUNTER — Other Ambulatory Visit: Payer: Self-pay

## 2019-02-04 DIAGNOSIS — Z1159 Encounter for screening for other viral diseases: Secondary | ICD-10-CM | POA: Diagnosis not present

## 2019-02-04 DIAGNOSIS — R42 Dizziness and giddiness: Secondary | ICD-10-CM | POA: Insufficient documentation

## 2019-02-04 DIAGNOSIS — R1084 Generalized abdominal pain: Secondary | ICD-10-CM | POA: Insufficient documentation

## 2019-02-04 DIAGNOSIS — I313 Pericardial effusion (noninflammatory): Secondary | ICD-10-CM | POA: Insufficient documentation

## 2019-02-04 DIAGNOSIS — K5750 Diverticulosis of both small and large intestine without perforation or abscess without bleeding: Secondary | ICD-10-CM | POA: Diagnosis not present

## 2019-02-04 DIAGNOSIS — Z79899 Other long term (current) drug therapy: Secondary | ICD-10-CM | POA: Diagnosis not present

## 2019-02-04 DIAGNOSIS — R9431 Abnormal electrocardiogram [ECG] [EKG]: Secondary | ICD-10-CM | POA: Diagnosis not present

## 2019-02-04 DIAGNOSIS — E538 Deficiency of other specified B group vitamins: Secondary | ICD-10-CM | POA: Insufficient documentation

## 2019-02-04 DIAGNOSIS — F329 Major depressive disorder, single episode, unspecified: Secondary | ICD-10-CM | POA: Insufficient documentation

## 2019-02-04 DIAGNOSIS — G4733 Obstructive sleep apnea (adult) (pediatric): Secondary | ICD-10-CM | POA: Diagnosis not present

## 2019-02-04 DIAGNOSIS — R001 Bradycardia, unspecified: Secondary | ICD-10-CM | POA: Diagnosis not present

## 2019-02-04 DIAGNOSIS — R109 Unspecified abdominal pain: Secondary | ICD-10-CM | POA: Diagnosis not present

## 2019-02-04 DIAGNOSIS — F419 Anxiety disorder, unspecified: Secondary | ICD-10-CM | POA: Insufficient documentation

## 2019-02-04 DIAGNOSIS — J45909 Unspecified asthma, uncomplicated: Secondary | ICD-10-CM | POA: Diagnosis not present

## 2019-02-04 DIAGNOSIS — G8929 Other chronic pain: Secondary | ICD-10-CM | POA: Diagnosis not present

## 2019-02-04 DIAGNOSIS — K429 Umbilical hernia without obstruction or gangrene: Secondary | ICD-10-CM | POA: Insufficient documentation

## 2019-02-04 DIAGNOSIS — K219 Gastro-esophageal reflux disease without esophagitis: Secondary | ICD-10-CM | POA: Diagnosis not present

## 2019-02-04 DIAGNOSIS — I119 Hypertensive heart disease without heart failure: Secondary | ICD-10-CM | POA: Insufficient documentation

## 2019-02-04 LAB — CBC
HCT: 37.5 % (ref 36.0–46.0)
Hemoglobin: 11.2 g/dL — ABNORMAL LOW (ref 12.0–15.0)
MCH: 26.3 pg (ref 26.0–34.0)
MCHC: 29.9 g/dL — ABNORMAL LOW (ref 30.0–36.0)
MCV: 88 fL (ref 80.0–100.0)
Platelets: 229 10*3/uL (ref 150–400)
RBC: 4.26 MIL/uL (ref 3.87–5.11)
RDW: 16.3 % — ABNORMAL HIGH (ref 11.5–15.5)
WBC: 6.1 10*3/uL (ref 4.0–10.5)
nRBC: 0 % (ref 0.0–0.2)

## 2019-02-04 LAB — COMPREHENSIVE METABOLIC PANEL
ALT: 9 U/L (ref 0–44)
AST: 12 U/L — ABNORMAL LOW (ref 15–41)
Albumin: 4 g/dL (ref 3.5–5.0)
Alkaline Phosphatase: 97 U/L (ref 38–126)
Anion gap: 9 (ref 5–15)
BUN: 27 mg/dL — ABNORMAL HIGH (ref 8–23)
CO2: 22 mmol/L (ref 22–32)
Calcium: 9.2 mg/dL (ref 8.9–10.3)
Chloride: 106 mmol/L (ref 98–111)
Creatinine, Ser: 0.91 mg/dL (ref 0.44–1.00)
GFR calc Af Amer: >60 mL/min (ref >60)
GFR calc non Af Amer: >60 mL/min (ref >60)
Glucose, Bld: 107 mg/dL — ABNORMAL HIGH (ref 70–99)
Potassium: 4 mmol/L (ref 3.5–5.1)
Sodium: 137 mmol/L (ref 135–145)
Total Bilirubin: 0.4 mg/dL (ref 0.3–1.2)
Total Protein: 7.7 g/dL (ref 6.5–8.1)

## 2019-02-04 LAB — TROPONIN I (HIGH SENSITIVITY): Troponin I (High Sensitivity): 7 ng/L (ref <18)

## 2019-02-04 LAB — SARS CORONAVIRUS 2 BY RT PCR (HOSPITAL ORDER, PERFORMED IN ~~LOC~~ HOSPITAL LAB): SARS Coronavirus 2: NEGATIVE

## 2019-02-04 LAB — LIPASE, BLOOD: Lipase: 22 U/L (ref 11–51)

## 2019-02-04 MED ORDER — HYDRALAZINE HCL 25 MG PO TABS
25.0000 mg | ORAL_TABLET | Freq: Three times a day (TID) | ORAL | Status: DC
  Administered 2019-02-05 – 2019-02-06 (×5): 25 mg via ORAL
  Filled 2019-02-04 (×6): qty 1

## 2019-02-04 MED ORDER — MAGNESIUM HYDROXIDE 400 MG/5ML PO SUSP: 30.0000 mL | Freq: Every day | ORAL | Status: DC | PRN

## 2019-02-04 MED ORDER — TRAZODONE HCL 50 MG PO TABS: 25.0000 mg | ORAL_TABLET | Freq: Every evening | ORAL | Status: DC | PRN

## 2019-02-04 MED ORDER — ENOXAPARIN SODIUM 40 MG/0.4ML ~~LOC~~ SOLN
40.0000 mg | Freq: Two times a day (BID) | SUBCUTANEOUS | Status: DC
  Administered 2019-02-05 – 2019-02-06 (×3): 40 mg via SUBCUTANEOUS
  Filled 2019-02-04 (×3): qty 0.4

## 2019-02-04 MED ORDER — SODIUM CHLORIDE 0.9 % IV SOLN
INTRAVENOUS | Status: DC
  Administered 2019-02-05: 02:00:00 via INTRAVENOUS

## 2019-02-04 MED ORDER — PANTOPRAZOLE SODIUM 40 MG PO TBEC
40.0000 mg | DELAYED_RELEASE_TABLET | Freq: Every day | ORAL | Status: DC
  Administered 2019-02-05 – 2019-02-06 (×2): 40 mg via ORAL
  Filled 2019-02-04 (×2): qty 1

## 2019-02-04 MED ORDER — ASPIRIN EC 81 MG PO TBEC
81.0000 mg | DELAYED_RELEASE_TABLET | Freq: Every day | ORAL | Status: DC
  Administered 2019-02-05 – 2019-02-06 (×2): 81 mg via ORAL
  Filled 2019-02-04 (×2): qty 1

## 2019-02-04 MED ORDER — ACETAMINOPHEN 325 MG PO TABS
650.0000 mg | ORAL_TABLET | Freq: Four times a day (QID) | ORAL | Status: DC | PRN
  Administered 2019-02-05: 650 mg via ORAL
  Filled 2019-02-04 (×2): qty 2

## 2019-02-04 MED ORDER — ACETAMINOPHEN 650 MG RE SUPP: 650.0000 mg | Freq: Four times a day (QID) | RECTAL | Status: DC | PRN

## 2019-02-04 MED ORDER — IOPAMIDOL (ISOVUE-370) INJECTION 76%
100.0000 mL | Freq: Once | INTRAVENOUS | Status: AC | PRN
  Administered 2019-02-04: 100 mL via INTRAVENOUS

## 2019-02-04 MED ORDER — VITAMIN B-12 1000 MCG PO TABS
500.0000 ug | ORAL_TABLET | Freq: Every day | ORAL | Status: DC
  Administered 2019-02-05 – 2019-02-06 (×2): 500 ug via ORAL
  Filled 2019-02-04 (×2): qty 1

## 2019-02-04 MED ORDER — ONDANSETRON HCL 4 MG PO TABS: 4.0000 mg | ORAL_TABLET | Freq: Four times a day (QID) | ORAL | Status: DC | PRN

## 2019-02-04 MED ORDER — MECLIZINE HCL 12.5 MG PO TABS
12.5000 mg | ORAL_TABLET | Freq: Three times a day (TID) | ORAL | Status: DC | PRN
  Administered 2019-02-06: 12.5 mg via ORAL
  Filled 2019-02-04 (×2): qty 1

## 2019-02-04 MED ORDER — SIMVASTATIN 20 MG PO TABS
20.0000 mg | ORAL_TABLET | Freq: Every day | ORAL | Status: DC
  Administered 2019-02-05: 20 mg via ORAL
  Filled 2019-02-04: qty 1

## 2019-02-04 MED ORDER — ONDANSETRON HCL 4 MG/2ML IJ SOLN
4.0000 mg | Freq: Four times a day (QID) | INTRAMUSCULAR | Status: DC | PRN
  Administered 2019-02-05: 4 mg via INTRAVENOUS
  Filled 2019-02-04: qty 2

## 2019-02-04 MED ORDER — METOCLOPRAMIDE HCL 10 MG PO TABS
5.0000 mg | ORAL_TABLET | Freq: Three times a day (TID) | ORAL | Status: DC
  Administered 2019-02-05 – 2019-02-06 (×5): 5 mg via ORAL
  Filled 2019-02-04 (×5): qty 1

## 2019-02-04 NOTE — ED Triage Notes (Signed)
C/o headache and abdominal pain today. Had normal BM today, denies NVD.

## 2019-02-04 NOTE — ED Notes (Signed)
Pt denies having a legal guardian. No evidence of such seen in chart, no names or phone numbers listed as legal guardian.

## 2019-02-04 NOTE — ED Provider Notes (Signed)
[facility] ____________________________________________  Time seen: Approximately 2:43 PM  I have reviewed the triage vital signs and the nursing notes.   HISTORY  Chief Complaint Abdominal Pain  HPI Taylor Johnson is a 73 y.o. female history of developmental delay, diverticulosis, H. pylori, recurrent abdominal pain presenting for abdominal pain and dizziness.  Patient sister at bedside and helping as historian.  Patient has had extensive work-up and evaluation regarding abdominal complaints with recent CT abdomen and upper endoscopy at the end of June.  Patient states that the abdominal pain has never resolved and has been same as before except for today it worsened.  States pain currently is "bad ".  Some nausea.  No vomiting.  No diarrhea.  Last bowel movement today and described as normal, normal coloration.  Currently taking Protonix and Reglan without resolution.    Patient however also reports for the last few weeks she has been having "dizziness "and head spinning sensation that occurs frequently.  States this is worse with ambulation and activity but also has some episodes at rest.Positive near syncope episodes.  Stating she has to sit down before she falls out.  No syncope.  No head injury.  Denies history of the same.  Denies any associated chest pain, shortness of breath, palpitations, paresthesias, unilateral weakness, or vision changes.  [name], MD PCP GI: [facility] Ditch    Past Medical History:  Diagnosis Date  . Abdominal pain 01/27/2017  . Anxiety   . Anxiety, generalized 05/11/2015  . Asthma   . Cough 06/02/2016   Chronic - followed by Pulmonary  . Depression   . Developmental delay   . Diverticulitis   . Diverticulosis of colon 05/11/2015  . Dysphagia 10/15/2015   Routine Ba Swallow normal; rec modified barium swallow   . Hypoxia 09/12/2015  . Leg swelling   . Localized edema 04/15/2016  . Microscopic hematuria 04/15/2016  . Nausea and vomiting  01/29/2017  . Orthostatic hypotension 03/16/2017  . OSA on CPAP 11/18/2015   CPAP @ 18 cm H2O started 02/2016  . Rectal bleeding 10/15/2018  . Urge incontinence of urine 05/12/2015    Patient Active Problem List   Diagnosis Date Noted  . Elevated blood pressure reading 10/23/2018  . Functional systolic murmur 16/07/930  . Developmental delay disorder 02/06/2017  . Calculus of gallbladder without cholecystitis without obstruction 01/20/2017  . Cough 06/02/2016  . Localized edema 04/15/2016  . Microscopic hematuria 04/15/2016  . OSA on CPAP 11/18/2015  . Dysphagia 10/15/2015  . Urge incontinence of urine 05/12/2015  . Diverticulosis of colon 05/11/2015  . Anxiety, generalized 05/11/2015    Past Surgical History:  Procedure Laterality Date  . ABDOMINAL HYSTERECTOMY    . BREAST BIOPSY Left    neg  . COLON SURGERY    . ESOPHAGOGASTRODUODENOSCOPY (EGD) WITH PROPOFOL N/A 01/29/2017   Procedure: ESOPHAGOGASTRODUODENOSCOPY (EGD) WITH PROPOFOL;  Surgeon: Wilford Corner, MD;  Location: [facility] ENDOSCOPY;  Service: Endoscopy;  Laterality: N/A;     No current facility-administered medications for this encounter.   Current Outpatient Medications:  .  hydrALAZINE (APRESOLINE) 25 MG tablet, Take 1 tablet (25 mg total) by mouth 3 (three) times daily., Disp: 90 tablet, Rfl: 5 .  metoCLOPramide (REGLAN) 5 MG tablet, Take 1 tablet (5 mg total) by mouth 3 (three) times daily before meals., Disp: 90 tablet, Rfl: 0 .  pantoprazole (PROTONIX) 40 MG tablet, Take 1 tablet (40 mg total) by mouth daily., Disp: 30 tablet, Rfl: 0 .  vitamin  B-12 (CYANOCOBALAMIN) 500 MCG tablet, Take 500 mcg by mouth daily., Disp: , Rfl:   Allergies Patient has no known allergies.  Family History  Problem Relation Age of Onset  . Hypertension Mother   . Kidney disease Mother   . Dementia Mother   . Cancer Father        lung  . Bladder Cancer Neg Hx   . Breast cancer Neg Hx     Social History Social History    Tobacco Use  . Smoking status: Never Smoker  . Smokeless tobacco: Never Used  . Tobacco comment: smoking cessation materials not required  Substance Use Topics  . Alcohol use: No    Alcohol/week: 0.0 standard drinks  . Drug use: No    Review of Systems Constitutional: No fever ENT: No sore throat. Cardiovascular: Denies chest pain. Respiratory: Denies shortness of breath. Gastrointestinal: Positive abdominal pain. Genitourinary: Negative for dysuria. Musculoskeletal: Negative for back pain. Skin: Negative for rash. Neurological: Negative for focal weakness or numbness.  Positive dizziness.   ____________________________________________   PHYSICAL EXAM:  VITAL SIGNS: ED Triage Vitals  Enc Vitals Group     BP 02/04/19 1415 (!) 128/56     Pulse Rate 02/04/19 1415 60     Resp 02/04/19 1415 18     Temp 02/04/19 1415 97.8 F (36.6 C)     Temp Source 02/04/19 1415 Oral     SpO2 02/04/19 1415 96 %     Weight 02/04/19 1418 246 lb 14.6 oz (112 kg)     Height 02/04/19 1418 5\' 4"  (1.626 m)     Head Circumference --      Peak Flow --      Pain Score 02/04/19 1422 5     Pain Loc --      Pain Edu? --      Excl. in [place]? --    Vitals:   02/04/19 1418 02/04/19 1449 02/04/19 1451 02/04/19 1453  BP:  136/67 137/67 (!) 136/55  Pulse:  64 63 66  Resp:      Temp:      TempSrc:      SpO2:      Weight: 246 lb 14.6 oz (112 kg)     Height: 5\' 4"  (1.626 m)        Constitutional: Alert and oriented. Well appearing and in no acute distress. Eyes: Conjunctivae are normal.  ENT      Head: Normocephalic and atraumatic. Cardiovascular: Normal rate, regular rhythm. Grossly normal heart sounds.  Good peripheral circulation. Respiratory: Normal respiratory effort without tachypnea nor retractions. Breath sounds are clear and equal bilaterally. No wheezes, rales, rhonchi. Gastrointestinal:Normal Bowel sounds.  Diffuse abdominal pain.  Musculoskeletal: No flank tenderness. Neurologic:   Normal speech and language. No gross focal neurologic deficits are appreciated.  Skin:  Skin is warm, dry and intact. No rash noted. Psychiatric: Mood and affect are normal. Speech and behavior are normal.   ___________________________________________   LABS (all labs ordered are listed, but only abnormal results are displayed)  Labs Reviewed - No data to display ____________________________________________  EKG  ED ECG REPORT I, Marylene Land, the attending provider, personally viewed and interpreted this ECG.   Date: 02/04/2019  EKG Time: 1446  Rate: 62  Rhythm: Normal sinus rhythm.  Possible left atrial enlargement.  Intervals:normal  ST&T Change: T wave abnormality, consider lateral ischemia. unable to view EKG from 01/13/2019 but dictated report consistent with today's.  PROCEDURES Procedures     INITIAL IMPRESSION /  ASSESSMENT AND PLAN / ED COURSE  Pertinent labs & imaging results that were available during my care of the patient were reviewed by me and considered in my medical decision making (see chart for details).  Patient with sister at bedside presenting for evaluation abdominal pain.  Patient poor historian with difficulty describing complaints.  This has been an ongoing issue for patient with acute worsening today.  Patient states currently her pain is "bad" and diffuse.  Suspect continued chronic abdominal pain.  Also complaining of dizziness with near syncope episodes.  Reviewed recent hospital notes, and notes of patient's weakness with bradycardic episodes were found.  Patient heart rate in low 60s today, discussed concern for possible symptomatic bradycardia.  However discussed with patient and sister recommend further evaluation emergency room at this time.  Sister states she will take patient to [facility]  Patient agrees to plan. ____________________________________________   FINAL CLINICAL IMPRESSION(S) / ED DIAGNOSES  Final diagnoses:  Dizziness   Generalized abdominal pain     ED Discharge Orders    None       Note: This dictation was prepared with Dragon dictation along with smaller phrase technology. Any transcriptional errors that result from this process are unintentional.         Marylene Land, NP 02/04/19 [phone]

## 2019-02-04 NOTE — ED Triage Notes (Signed)
Pt states she awoke with central abd pain. Her doctor is off today. Head gets "dizzy". No n/v. Had large BM today.

## 2019-02-04 NOTE — Discharge Instructions (Addendum)
Proceed directly to emergency room as discussed.

## 2019-02-04 NOTE — ED Provider Notes (Signed)
[facility] Emergency Department Provider Note  ____________________________________________   First MD Initiated Contact with Patient 02/04/19 1832     (approximate)  I have reviewed the triage vital signs and the nursing notes.   HISTORY  Chief Complaint Abdominal Pain and Headache    HPI Taylor Johnson is a 73 y.o. female developmental delay, diverticulosis, H. pylori who presents for abdominal pain and dizziness.  Patient said her abdominal pain started today.  It is located over her whole abdomen.  Constant, nothing makes better, nothing makes it worse.  Patient also endorses feeling dizzy when she stands up.  This has not she has any vomiting, fevers, difficulty with ambulation.  Patient uses a cane at baseline.  Patient seen by an NP and sent here for further evaluation.        Past Medical History:  Diagnosis Date  . Abdominal pain 01/27/2017  . Anxiety   . Anxiety, generalized 05/11/2015  . Asthma   . Cough 06/02/2016   Chronic - followed by Pulmonary  . Depression   . Developmental delay   . Diverticulitis   . Diverticulosis of colon 05/11/2015  . Dysphagia 10/15/2015   Routine Ba Swallow normal; rec modified barium swallow   . Hypoxia 09/12/2015  . Leg swelling   . Localized edema 04/15/2016  . Microscopic hematuria 04/15/2016  . Nausea and vomiting 01/29/2017  . Orthostatic hypotension 03/16/2017  . OSA on CPAP 11/18/2015   CPAP @ 18 cm H2O started 02/2016  . Rectal bleeding 10/15/2018  . Urge incontinence of urine 05/12/2015    Patient Active Problem List   Diagnosis Date Noted  . Elevated blood pressure reading 10/23/2018  . Functional systolic murmur 25/95/6387  . Developmental delay disorder 02/06/2017  . Calculus of gallbladder without cholecystitis without obstruction 01/20/2017  . Cough 06/02/2016  . Localized edema 04/15/2016  . Microscopic hematuria 04/15/2016  . OSA on CPAP 11/18/2015  . Dysphagia 10/15/2015  . Urge  incontinence of urine 05/12/2015  . Diverticulosis of colon 05/11/2015  . Anxiety, generalized 05/11/2015    Past Surgical History:  Procedure Laterality Date  . ABDOMINAL HYSTERECTOMY    . BREAST BIOPSY Left    neg  . COLON SURGERY    . ESOPHAGOGASTRODUODENOSCOPY (EGD) WITH PROPOFOL N/A 01/29/2017   Procedure: ESOPHAGOGASTRODUODENOSCOPY (EGD) WITH PROPOFOL;  Surgeon: Wilford Corner, MD;  Location: [facility] ENDOSCOPY;  Service: Endoscopy;  Laterality: N/A;    Prior to Admission medications   Medication Sig Start Date End Date Taking? Authorizing Provider  hydrALAZINE (APRESOLINE) 25 MG tablet Take 1 tablet (25 mg total) by mouth 3 (three) times daily. 10/23/18   [name], MD  metoCLOPramide (REGLAN) 5 MG tablet Take 1 tablet (5 mg total) by mouth 3 (three) times daily before meals. 01/21/19 02/20/19  [name], MD  pantoprazole (PROTONIX) 40 MG tablet Take 1 tablet (40 mg total) by mouth daily. 01/16/19 01/16/20  [name], MD  vitamin B-12 (CYANOCOBALAMIN) 500 MCG tablet Take 500 mcg by mouth daily.    [provider]    Allergies Patient has no known allergies.  Family History  Problem Relation Age of Onset  . Hypertension Mother   . Kidney disease Mother   . Dementia Mother   . Cancer Father        lung  . Bladder Cancer Neg Hx   . Breast cancer Neg Hx     Social History Social History   Tobacco Use  . Smoking  status: Never Smoker  . Smokeless tobacco: Never Used  . Tobacco comment: smoking cessation materials not required  Substance Use Topics  . Alcohol use: No    Alcohol/week: 0.0 standard drinks  . Drug use: No      Review of Systems Constitutional: No fever/chills Eyes: No visual changes. ENT: No sore throat. Cardiovascular: Denies chest pain. Respiratory: Denies shortness of breath. Gastrointestinal: Positive abdominal pain.  No nausea, no vomiting.  No diarrhea.  No constipation. Genitourinary: Negative for dysuria. Musculoskeletal:  Negative for back pain. Skin: Negative for rash. Neurological: Negative for headaches, focal weakness or numbness.  Positive dizziness All other ROS negative ____________________________________________   PHYSICAL EXAM:  VITAL SIGNS: ED Triage Vitals [02/04/19 1552]  Enc Vitals Group     BP (!) 127/50     Pulse Rate 65     Resp 16     Temp 98.6 F (37 C)     Temp Source Oral     SpO2 100 %     Weight 246 lb 14.6 oz (112 kg)     Height 5\' 4"  (1.626 m)     Head Circumference      Peak Flow      Pain Score 5     Pain Loc      Pain Edu?      Excl. in [place]?     Constitutional: Alert and oriented. Well appearing and in no acute distress. Eyes: Conjunctivae are normal. EOMI. Head: Atraumatic. Nose: No congestion/rhinnorhea. Mouth/Throat: Mucous membranes are moist.   Neck: No stridor. Trachea Midline. FROM Cardiovascular: Normal rate, regular rhythm. Grossly normal heart sounds.  Good peripheral circulation. Respiratory: Normal respiratory effort.  No retractions. Lungs CTAB. Gastrointestinal: Tenderness on palpation in all 4 quadrants.. No distention. No abdominal bruits.  Musculoskeletal: No lower extremity tenderness nor edema.  No joint effusions. Neurologic:  Normal speech and language. No gross focal neurologic deficits are appreciated.  Normal finger-to-nose.  Clear nerves II through XII intact. Skin:  Skin is warm, dry and intact. No rash noted. Psychiatric: Mood and affect are normal. Speech and behavior are normal. GU: Deferred   ____________________________________________   LABS (all labs ordered are listed, but only abnormal results are displayed)  Labs Reviewed  COMPREHENSIVE METABOLIC PANEL - Abnormal; Notable for the following components:      Result Value   Glucose, Bld 107 (*)    BUN 27 (*)    AST 12 (*)    All other components within normal limits  CBC - Abnormal; Notable for the following components:   Hemoglobin 11.2 (*)    MCHC 29.9 (*)    RDW  16.3 (*)    All other components within normal limits  LIPASE, BLOOD  URINALYSIS, COMPLETE (UACMP) WITH MICROSCOPIC   ____________________________________________   ED ECG REPORT I, Vanessa Bay[place] Cane, the attending physician, personally viewed and interpreted this ECG.  EKG normal sinus rate of 62, no ST elevation, does have a flipped T wave in leads V4 through V6.  Normal intervals. ____________________________________________  RADIOLOGY   Official radiology report(s): Ct Angio Head W Or Wo Contrast  Result Date: 02/04/2019 CLINICAL DATA:  Headache today. EXAM: CT ANGIOGRAPHY HEAD TECHNIQUE: Multidetector CT imaging of the head was performed using the standard protocol during bolus administration of intravenous contrast. Multiplanar CT image reconstructions and MIPs were obtained to evaluate the vascular anatomy. CONTRAST:  135mL ISOVUE-370 IOPAMIDOL (ISOVUE-370) INJECTION 76% COMPARISON:  CT 12/21/2014 FINDINGS: CT HEAD Brain: The brain shows a  normal appearance without evidence of malformation, atrophy, old or acute small or large vessel infarction, mass lesion, hemorrhage, hydrocephalus or extra-axial collection. Vascular: No hyperdense vessel. No evidence of atherosclerotic calcification. Skull: Normal.  No traumatic finding.  No focal bone lesion. Sinuses/Orbits: Sinuses are clear. Orbits appear normal. Mastoids are clear. Other: None significant CTA HEAD Anterior circulation: Both internal carotid arteries are patent through the skull base and siphon regions. No siphon stenosis. The anterior and middle cerebral vessels are patent without proximal stenosis, aneurysm or vascular malformation. Posterior circulation: Both vertebral arteries are patent through the foramen magnum to the basilar. No basilar stenosis. No posterior circulation aneurysm. Diminutive posterior circulation due to primary fetal origin of both posterior cerebral arteries from the anterior circulation. Venous sinuses: Patent  and normal. Anatomic variants: None other significant. Delayed phase: Not performed. IMPRESSION: Normal head CT. Normal CT angiography for age. No evidence of aneurysm, stenosis or occlusion. Electronically Signed   By: Nelson Chimes M.D.   On: 02/04/2019 20:07   Ct Abdomen Pelvis W Contrast  Result Date: 02/04/2019 CLINICAL DATA:  Abdominal pain today. EXAM: CT ABDOMEN AND PELVIS WITH CONTRAST TECHNIQUE: Multidetector CT imaging of the abdomen and pelvis was performed using the standard protocol following bolus administration of intravenous contrast. CONTRAST:  100 cc Isovue 370 IV COMPARISON:  CT 01/13/2019 FINDINGS: Lower chest: Scattered pulmonary cysts, chronic and unchanged. Lingular atelectasis or scarring. Mild cardiomegaly. Small pericardial effusion which is chronic. Hepatobiliary: Scattered subcentimeter hypodensities too small to characterize. Few calcified granuloma. Mild central intrahepatic biliary ductal dilatation which is unchanged from prior. Gallbladder physiologically distended. No calcified gallstone. No common bile duct dilatation. Pancreas: No ductal dilatation or inflammation. Spleen: Normal in size without focal abnormality. Adrenals/Urinary Tract: No adrenal nodule. Portions of the kidney are obscured by streak artifact from dense retained barium within diverticula, including a duodenal diverticulum. No hydronephrosis or perinephric edema. Homogeneous renal enhancement with symmetric excretion on delayed phase imaging. The subcentimeter left upper pole hypodensity on exam 1 year ago is not as well-defined currently. Urinary bladder is physiologically distended without wall thickening. Stomach/Bowel: High-density barium within innumerable colonic diverticula as well as the duodenal diverticulum from upper GI 01/15/2019. This causes streak artifact on limited bowel evaluation. The stomach is nondistended. No bowel obstruction or evidence of inflammation. Rather extensive colonic  diverticulosis without diverticulitis. No colonic wall thickening. High-riding cecum, unchanged from prior. Normal appendix, image 34 series 7. Vascular/Lymphatic: Abdominal aorta is normal in caliber. Portal vein is patent. No abdominopelvic adenopathy, allowing for multifocal streak artifact. Reproductive: Enlarged uterus with fibroids, some of which are calcified, unchanged in CT appearance from prior. No obvious adnexal mass. Other: Tiny fat containing umbilical hernia. No free air, free fluid, or intra-abdominal fluid collection. Musculoskeletal: There are no acute or suspicious osseous abnormalities. IMPRESSION: 1. No acute findings in the abdomen/pelvis. 2. Diffuse multifocal colonic diverticulosis without diverticulitis. Many diverticula of retained high-density barium from recent upper GI with streak artifact slightly limiting evaluation. 3. Duodenal diverticulum with retained barium. 4. Additional stable chronic findings as described. Electronically Signed   By: Keith Rake M.D.   On: 02/04/2019 20:13    ____________________________________________   PROCEDURES  Procedure(s) performed (including Critical Care):  Procedures   ____________________________________________   INITIAL IMPRESSION / ASSESSMENT AND PLAN / ED COURSE  Taylor Johnson was evaluated in Emergency Department on 02/04/2019 for the symptoms described in the history of present illness. She was evaluated in the context of the global COVID-19 pandemic, which  necessitated consideration that the patient might be at risk for infection with the SARS-CoV-2 virus that causes COVID-19. Institutional protocols and algorithms that pertain to the evaluation of patients at risk for COVID-19 are in a state of rapid change based on information released by regulatory bodies including the CDC and federal and state organizations. These policies and algorithms were followed during the patient's care in the ED.     Given patient is  significantly tender on exam will get a CT scan to evaluate for SBO, diverticulitis, abscess, appendicitis.  For patient's dizziness patient stood up and heart rate stayed around 55-60.  Patient feels dizzy with standing. Low suspicion for posterior stroke but will get CT head CTA to further evaluate.    Lipase normal making pancreatitis less likely.  Liver function is normal.  Labs without any evidence of leukocytosis.  Hemoglobin is around baseline.  Normal CT scan and normal CTA.  9:59 PM patient feels very dizzy with standing.  Reevaluated patient's heart rates.  Seems like patient's been having episodes of heart rates in the 45-60 range while here.  This seems lower than her baseline.  Reviewed medications I do not see any beta-blockers or calcium channel blockers.  Patient does have new lateral T wave inversions and a slightly elevated troponin.  Therefore will admit to medicine for further work-up.  Admit to the hospital team.    Clinical Course as of Feb 04 2119  Mon Feb 04, 2019  2111 CT scans negative.    [MF]    Clinical Course User Index [MF] Vanessa Hettick, MD     ____________________________________________   FINAL CLINICAL IMPRESSION(S) / ED DIAGNOSES   Final diagnoses:  Dizziness  Generalized abdominal pain      MEDICATIONS GIVEN DURING THIS VISIT:  Medications  iopamidol (ISOVUE-370) 76 % injection 100 mL (100 mLs Intravenous Contrast Given 02/04/19 1930)     ED Discharge Orders    None       Note:  This document was prepared using Dragon voice recognition software and may include unintentional dictation errors.    Vanessa , MD 02/04/19 [phone]

## 2019-02-05 ENCOUNTER — Observation Stay: Payer: Medicare Other

## 2019-02-05 ENCOUNTER — Observation Stay (HOSPITAL_BASED_OUTPATIENT_CLINIC_OR_DEPARTMENT_OTHER)
Admit: 2019-02-05 | Discharge: 2019-02-05 | Disposition: A | Discharge status: Home / Self Care | Payer: Medicare Other | Attending: Family Medicine | Admitting: Family Medicine

## 2019-02-05 DIAGNOSIS — I361 Nonrheumatic tricuspid (valve) insufficiency: Secondary | ICD-10-CM | POA: Diagnosis not present

## 2019-02-05 DIAGNOSIS — R42 Dizziness and giddiness: Secondary | ICD-10-CM | POA: Diagnosis not present

## 2019-02-05 DIAGNOSIS — I340 Nonrheumatic mitral (valve) insufficiency: Secondary | ICD-10-CM

## 2019-02-05 LAB — CBC
HCT: 39.6 % (ref 36.0–46.0)
Hemoglobin: 12 g/dL (ref 12.0–15.0)
MCH: 26.5 pg (ref 26.0–34.0)
MCHC: 30.3 g/dL (ref 30.0–36.0)
MCV: 87.4 fL (ref 80.0–100.0)
Platelets: 223 10*3/uL (ref 150–400)
RBC: 4.53 MIL/uL (ref 3.87–5.11)
RDW: 16.4 % — ABNORMAL HIGH (ref 11.5–15.5)
WBC: 5.9 10*3/uL (ref 4.0–10.5)
nRBC: 0 % (ref 0.0–0.2)

## 2019-02-05 LAB — COMPREHENSIVE METABOLIC PANEL
ALT: 10 U/L (ref 0–44)
AST: 14 U/L — ABNORMAL LOW (ref 15–41)
Albumin: 4 g/dL (ref 3.5–5.0)
Alkaline Phosphatase: 96 U/L (ref 38–126)
Anion gap: 7 (ref 5–15)
BUN: 21 mg/dL (ref 8–23)
CO2: 25 mmol/L (ref 22–32)
Calcium: 9.1 mg/dL (ref 8.9–10.3)
Chloride: 106 mmol/L (ref 98–111)
Creatinine, Ser: 0.96 mg/dL (ref 0.44–1.00)
GFR calc Af Amer: >60 mL/min (ref >60)
GFR calc non Af Amer: 59 mL/min — ABNORMAL LOW (ref >60)
Glucose, Bld: 100 mg/dL — ABNORMAL HIGH (ref 70–99)
Potassium: 3.6 mmol/L (ref 3.5–5.1)
Sodium: 138 mmol/L (ref 135–145)
Total Bilirubin: 0.3 mg/dL (ref 0.3–1.2)
Total Protein: 8 g/dL (ref 6.5–8.1)

## 2019-02-05 LAB — TROPONIN I (HIGH SENSITIVITY)
Troponin I (High Sensitivity): 10 ng/L (ref <18)
Troponin I (High Sensitivity): 10 ng/L (ref <18)
Troponin I (High Sensitivity): 9 ng/L (ref <18)

## 2019-02-05 LAB — LIPID PANEL
Cholesterol: 163 mg/dL (ref 0–200)
HDL: 63 mg/dL (ref >40)
LDL Cholesterol: 86 mg/dL (ref 0–99)
Total CHOL/HDL Ratio: 2.6 RATIO
Triglycerides: 72 mg/dL (ref <150)
VLDL: 14 mg/dL (ref 0–40)

## 2019-02-05 MED ORDER — SIMETHICONE 80 MG PO CHEW
80.0000 mg | CHEWABLE_TABLET | Freq: Four times a day (QID) | ORAL | Status: DC | PRN
  Administered 2019-02-05 – 2019-02-06 (×3): 80 mg via ORAL
  Filled 2019-02-05 (×5): qty 1

## 2019-02-05 MED ORDER — MORPHINE SULFATE (PF) 2 MG/ML IV SOLN: 1.0000 mg | INTRAVENOUS | Status: DC | PRN

## 2019-02-05 NOTE — Progress Notes (Signed)
Callie Degraffinreaidt, (sister) called and updated by this RN with patient permission.

## 2019-02-05 NOTE — Care Management Obs Status (Signed)
[facility] NOTIFICATION   Patient Details  Name: ESTRELLITA LASKY MRN: [id number] Date of Birth: [birth year]   Medicare Observation Status Notification Given:  Yes    Katrina Stack, RN 02/05/2019, 6:59 PM

## 2019-02-05 NOTE — ED Notes (Signed)
Attempted to collect urine sample, but patient's urine missed hat in toilet.

## 2019-02-05 NOTE — H&P (Signed)
[name] NAME: Taylor Johnson    MR#:  [id number]  DATE OF BIRTH:  January 04, 1946  DATE OF ADMISSION:  02/04/2019  PRIMARY CARE PHYSICIAN: [name], MD   REQUESTING/REFERRING PHYSICIAN: [name], MD  CHIEF COMPLAINT:   Chief Complaint  Patient presents with  . Abdominal Pain  . Headache    HISTORY OF PRESENT ILLNESS:  Taylor Johnson  is a 73 y.o. African-American female with a known history of multiple medical problems that are mentioned below, who presented to the emergency room with acute onset of dizziness especially when standing up with occasional vertigo.  She had mild headache wi no paresthesias or focal muscle weakness.  No dysuria, oliguria or hematuria or flank pain.  No fever or chills.  She denied any chest pain or dyspnea or palpitations or cough or wheezing or hemoptysis.  She apparently had reported abdominal pain earlier without nausea or vomiting or diarrhea.  Upon presentation to the emergency room, vital signs were unremarkable but later blood pressure was up to 155/73 and heart rate went down to 45.  Labs were remarkable for a slightly elevated BUN of 27 hemoglobin of 11.2 and hematocrit of 37.5 and her troponin I was only 7.  COVID-19 test came back negative.  Initial EKG when she came showed normal sinus rhythm with rate of 62 with nonspecific T wave changes.  Letter and sugar rate of 63.  Abdominal and pelvic CT scan revealed no acute findings.  It showed diffuse multifocal colonic diverticulosis without diverticulitis and duodenal diverticulum.  Head CT scan and CT angiography came back normal.  The patient will be admitted to an observation telemetry bed for further evaluation and monitoring.  PAST MEDICAL HISTORY:   Past Medical History:  Diagnosis Date  . Abdominal pain 01/27/2017  . Anxiety   . Anxiety, generalized 05/11/2015  . Asthma   . Cough 06/02/2016   Chronic - followed by Pulmonary  .  Depression   . Developmental delay   . Diverticulitis   . Diverticulosis of colon 05/11/2015  . Dysphagia 10/15/2015   Routine Ba Swallow normal; rec modified barium swallow   . Hypoxia 09/12/2015  . Leg swelling   . Localized edema 04/15/2016  . Microscopic hematuria 04/15/2016  . Nausea and vomiting 01/29/2017  . Orthostatic hypotension 03/16/2017  . OSA on CPAP 11/18/2015   CPAP @ 18 cm H2O started 02/2016  . Rectal bleeding 10/15/2018  . Urge incontinence of urine 05/12/2015    PAST SURGICAL HISTORY:   Past Surgical History:  Procedure Laterality Date  . ABDOMINAL HYSTERECTOMY    . BREAST BIOPSY Left    neg  . COLON SURGERY    . ESOPHAGOGASTRODUODENOSCOPY (EGD) WITH PROPOFOL N/A 01/29/2017   Procedure: ESOPHAGOGASTRODUODENOSCOPY (EGD) WITH PROPOFOL;  Surgeon: Wilford Corner, MD;  Location: [facility] ENDOSCOPY;  Service: Endoscopy;  Laterality: N/A;    SOCIAL HISTORY:   Social History   Tobacco Use  . Smoking status: Never Smoker  . Smokeless tobacco: Never Used  . Tobacco comment: smoking cessation materials not required  Substance Use Topics  . Alcohol use: No    Alcohol/week: 0.0 standard drinks    FAMILY HISTORY:   Family History  Problem Relation Age of Onset  . Hypertension Mother   . Kidney disease Mother   . Dementia Mother   . Cancer Father        lung  . Bladder Cancer Neg Hx   .  Breast cancer Neg Hx     DRUG ALLERGIES:  No Known Allergies  REVIEW OF SYSTEMS:   ROS As per history of present illness. All pertinent systems were reviewed above. Constitutional,  HEENT, cardiovascular, respiratory, GI, GU, musculoskeletal, neuro, psychiatric, endocrine,  integumentary and hematologic systems were reviewed and are otherwise  negative/unremarkable except for positive findings mentioned above in the HPI.   MEDICATIONS AT HOME:   Prior to Admission medications   Medication Sig Start Date End Date Taking? Authorizing Provider  hydrALAZINE (APRESOLINE) 25  MG tablet Take 1 tablet (25 mg total) by mouth 3 (three) times daily. 10/23/18  Yes [name], MD  metoCLOPramide (REGLAN) 5 MG tablet Take 1 tablet (5 mg total) by mouth 3 (three) times daily before meals. 01/21/19 02/20/19 Yes [name], MD  pantoprazole (PROTONIX) 40 MG tablet Take 1 tablet (40 mg total) by mouth daily. 01/16/19 01/16/20 Yes [name], MD  vitamin B-12 (CYANOCOBALAMIN) 500 MCG tablet Take 500 mcg by mouth daily.   Yes [provider]      VITAL SIGNS:  Blood pressure (!) 155/73, pulse (!) 46, temperature 98.6 F (37 C), temperature source Oral, resp. rate 20, height 5\' 4"  (1.626 m), weight 112 kg, SpO2 99 %.  PHYSICAL EXAMINATION:  Physical Exam  GENERAL:  73 y.o.-year-old African-American female patient lying in the bed with no acute distress.  EYES: Pupils equal, round, reactive to light and accommodation. No scleral icterus. Extraocular muscles intact.  HEENT: Head atraumatic, normocephalic. Oropharynx and nasopharynx clear.  NECK:  Supple, no jugular venous distention. No thyroid enlargement, no tenderness.  LUNGS: Normal breath sounds bilaterally, no wheezing, rales,rhonchi or crepitation. No use of accessory muscles of respiration.  CARDIOVASCULAR: Regular rate and rhythm, S1, S2 normal. No murmurs, rubs, or gallops.  ABDOMEN: Soft, nondistended, nontender. Bowel sounds present. No organomegaly or mass.  EXTREMITIES: No pedal edema, cyanosis, or clubbing.  NEUROLOGIC: Cranial nerves II through XII are intact. Muscle strength 5/5 in all extremities. Sensation intact. Gait not checked.  PSYCHIATRIC: The patient is alert and oriented x 3.  Normal affect and good eye contact. SKIN: No obvious rash, lesion, or ulcer.   LABORATORY PANEL:   CBC Recent Labs  Lab 02/04/19 1604  WBC 6.1  HGB 11.2*  HCT 37.5  PLT 229   ------------------------------------------------------------------------------------------------------------------  Chemistries   Recent Labs  Lab 02/04/19 1604  NA 137  K 4.0  CL 106  CO2 22  GLUCOSE 107*  BUN 27*  CREATININE 0.91  CALCIUM 9.2  AST 12*  ALT 9  ALKPHOS 97  BILITOT 0.4   ------------------------------------------------------------------------------------------------------------------  Cardiac Enzymes No results for input(s): TROPONINI in the last 168 hours. ------------------------------------------------------------------------------------------------------------------  RADIOLOGY:  Ct Angio Head W Or Wo Contrast  Result Date: 02/04/2019 CLINICAL DATA:  Headache today. EXAM: CT ANGIOGRAPHY HEAD TECHNIQUE: Multidetector CT imaging of the head was performed using the standard protocol during bolus administration of intravenous contrast. Multiplanar CT image reconstructions and MIPs were obtained to evaluate the vascular anatomy. CONTRAST:  123mL ISOVUE-370 IOPAMIDOL (ISOVUE-370) INJECTION 76% COMPARISON:  CT 12/21/2014 FINDINGS: CT HEAD Brain: The brain shows a normal appearance without evidence of malformation, atrophy, old or acute small or large vessel infarction, mass lesion, hemorrhage, hydrocephalus or extra-axial collection. Vascular: No hyperdense vessel. No evidence of atherosclerotic calcification. Skull: Normal.  No traumatic finding.  No focal bone lesion. Sinuses/Orbits: Sinuses are clear. Orbits appear normal. Mastoids are clear. Other: None significant CTA HEAD Anterior circulation: Both internal  carotid arteries are patent through the skull base and siphon regions. No siphon stenosis. The anterior and middle cerebral vessels are patent without proximal stenosis, aneurysm or vascular malformation. Posterior circulation: Both vertebral arteries are patent through the foramen magnum to the basilar. No basilar stenosis. No posterior circulation aneurysm. Diminutive posterior circulation due to primary fetal origin of both posterior cerebral arteries from the anterior circulation. Venous  sinuses: Patent and normal. Anatomic variants: None other significant. Delayed phase: Not performed. IMPRESSION: Normal head CT. Normal CT angiography for age. No evidence of aneurysm, stenosis or occlusion. Electronically Signed   By: Nelson Chimes M.D.   On: 02/04/2019 20:07   Ct Abdomen Pelvis W Contrast  Result Date: 02/04/2019 CLINICAL DATA:  Abdominal pain today. EXAM: CT ABDOMEN AND PELVIS WITH CONTRAST TECHNIQUE: Multidetector CT imaging of the abdomen and pelvis was performed using the standard protocol following bolus administration of intravenous contrast. CONTRAST:  100 cc Isovue 370 IV COMPARISON:  CT 01/13/2019 FINDINGS: Lower chest: Scattered pulmonary cysts, chronic and unchanged. Lingular atelectasis or scarring. Mild cardiomegaly. Small pericardial effusion which is chronic. Hepatobiliary: Scattered subcentimeter hypodensities too small to characterize. Few calcified granuloma. Mild central intrahepatic biliary ductal dilatation which is unchanged from prior. Gallbladder physiologically distended. No calcified gallstone. No common bile duct dilatation. Pancreas: No ductal dilatation or inflammation. Spleen: Normal in size without focal abnormality. Adrenals/Urinary Tract: No adrenal nodule. Portions of the kidney are obscured by streak artifact from dense retained barium within diverticula, including a duodenal diverticulum. No hydronephrosis or perinephric edema. Homogeneous renal enhancement with symmetric excretion on delayed phase imaging. The subcentimeter left upper pole hypodensity on exam 1 year ago is not as well-defined currently. Urinary bladder is physiologically distended without wall thickening. Stomach/Bowel: High-density barium within innumerable colonic diverticula as well as the duodenal diverticulum from upper GI 01/15/2019. This causes streak artifact on limited bowel evaluation. The stomach is nondistended. No bowel obstruction or evidence of inflammation. Rather extensive  colonic diverticulosis without diverticulitis. No colonic wall thickening. High-riding cecum, unchanged from prior. Normal appendix, image 34 series 7. Vascular/Lymphatic: Abdominal aorta is normal in caliber. Portal vein is patent. No abdominopelvic adenopathy, allowing for multifocal streak artifact. Reproductive: Enlarged uterus with fibroids, some of which are calcified, unchanged in CT appearance from prior. No obvious adnexal mass. Other: Tiny fat containing umbilical hernia. No free air, free fluid, or intra-abdominal fluid collection. Musculoskeletal: There are no acute or suspicious osseous abnormalities. IMPRESSION: 1. No acute findings in the abdomen/pelvis. 2. Diffuse multifocal colonic diverticulosis without diverticulitis. Many diverticula of retained high-density barium from recent upper GI with streak artifact slightly limiting evaluation. 3. Duodenal diverticulum with retained barium. 4. Additional stable chronic findings as described. Electronically Signed   By: Keith Rake M.D.   On: 02/04/2019 20:13      IMPRESSION AND PLAN:   1.  Dizziness and vertigo.  Differential diagnoses would include symptomatic bradycardia and benign positional vertigo and to less extent TIA with vertebrobasilar insufficiency. -The patient will be admitted to an observation telemetry bed. -We will follow neuro checks every 4 hours for 24 hours. -Will obtain a 2D echo and bilateral carotid Doppler as well as a brain MRI to assess for VBI. -The patient will be placed on PRN Antivert. -Will place on aspirin for now. -Will check fasting lipids and place her on Zocor pending further work-up for TIA. -Will obtain a cardiology consultation in a.m. to assess for possibly symptomatic bradycardia - We will follow high-sensitivity troponin I.  2.  Hypertension. -We will resume her hydralazine  3.  GERD. -We will resume her PPI therapy.  4.  Vitamin B12 deficiency. -We will continue her vitamin B12.  5.   DVT prophylaxis. -Subcutaneous Lovenox  All the records are reviewed and case discussed with ED provider. The plan of care was discussed in details with the patient (and family). I answered all questions. The patient agreed to proceed with the above mentioned plan. Further management will depend upon hospital course.   CODE STATUS: Full code  TOTAL TIME TAKING CARE OF THIS PATIENT: 50 minutes.    Christel Mormon M.D on 02/05/2019 at 12:00 AM  Pager - [phone]  After 6pm go to [email or web address] - Proofreader  [facility]  Office  [phone]  CC: Primary care physician; [name], MD   Note: This dictation was prepared with Dragon dictation along with smaller phrase technology. Any transcriptional errors that result from this process are unintentional.

## 2019-02-05 NOTE — Progress Notes (Signed)
Ch visited pt to educated pt on HPOA assignment. Pt was completing her breakfast upon ch arrival. Pt presented to be pleasantly confused. Pt shared that the writer could contact her sister Massie Maroon to inform her about the AD. Ch f/u with pt sister Massie Maroon via telephone twice but was not able to speak with the sister directly. Ch has assessed that pt is not able to finalize the AD at this time.   Goals: F/U with pt's care team to determine the pt's baseline/ status of vertigo / [place] plan    02/05/19 1000  Clinical Encounter Type  Visited With Patient  Visit Type Other (Comment) (AD education )  Referral From Physician  Consult/Referral To Chaplain  Recommendations follow up at a later time for HPOA completion   Stress Factors  Patient Stress Factors Major life changes;Health changes  Family Stress Factors None identified  Advance Directives (For Healthcare)  Does Patient Have a Medical Advance Directive? No  Would patient like information on creating a medical advance directive? Yes (Inpatient - patient defers creating a medical advance directive at this time - Information given)

## 2019-02-05 NOTE — ED Notes (Signed)
ED TO INPATIENT HANDOFF REPORT  ED Nurse Name and Phone #: Artist Pais, [phone]  S Name/Age/Gender Taylor Johnson 72 y.o. female Room/Bed: ED01HA/ED01HA  Code Status   Code Status: Full Code  Home/SNF/Other Home Patient oriented to: self, place, time and situation Is this baseline? Yes   Triage Complete: Triage complete  Chief Complaint Abd Pain/Dizziness  Triage Note C/o headache and abdominal pain today. Had normal BM today, denies NVD.    Allergies No Known Allergies  Level of Care/Admitting Diagnosis ED Disposition    ED Disposition Condition [facility] Area: [place] [[id number]]  Level of Care: Telemetry [5]  Covid Evaluation: Confirmed COVID Negative  Diagnosis: Dizziness [638466]  Admitting Physician: Christel Mormon [[id number]]  Attending Physician: Christel Mormon [[id number]]  PT Class (Do Not Modify): Observation [104]  PT Acc Code (Do Not Modify): Observation [10022]       B Medical/Surgery History Past Medical History:  Diagnosis Date  . Abdominal pain 01/27/2017  . Anxiety   . Anxiety, generalized 05/11/2015  . Asthma   . Cough 06/02/2016   Chronic - followed by Pulmonary  . Depression   . Developmental delay   . Diverticulitis   . Diverticulosis of colon 05/11/2015  . Dysphagia 10/15/2015   Routine Ba Swallow normal; rec modified barium swallow   . Hypoxia 09/12/2015  . Leg swelling   . Localized edema 04/15/2016  . Microscopic hematuria 04/15/2016  . Nausea and vomiting 01/29/2017  . Orthostatic hypotension 03/16/2017  . OSA on CPAP 11/18/2015   CPAP @ 18 cm H2O started 02/2016  . Rectal bleeding 10/15/2018  . Urge incontinence of urine 05/12/2015   Past Surgical History:  Procedure Laterality Date  . ABDOMINAL HYSTERECTOMY    . BREAST BIOPSY Left    neg  . COLON SURGERY    . ESOPHAGOGASTRODUODENOSCOPY (EGD) WITH PROPOFOL N/A 01/29/2017   Procedure: ESOPHAGOGASTRODUODENOSCOPY (EGD) WITH PROPOFOL;  Surgeon: Wilford Corner, MD;  Location[facility] END[facility]COPY;  Service: Endoscopy;  Laterality: N/A;     A IV Location/Drains/Wounds Patient Lines/Drains/Airways Status   Active Line/Drains/Airways    Name:   Placement date:   Placement time:   Site:   Days:   Peripheral IV 02/04/19 Left Antecubital   02/04/19    1912    Antecubital   1   External Urinary Catheter   01/14/19    1828    -   22          Intake/Output Last 24 hours No intake or output data in the 24 hours ending 02/05/19 0023  Labs/Imaging Results for orders placed or performed during the hospital encounter of 02/04/19 (from the past 48 hour(s))  Lipase, blood     Status: None   Collection Time: 02/04/19  4:04 PM  Result Value Ref Range   Lipase 22 11 - 51 U/L    Comment: Performed at [facility], [address] [place], [place] [address]  Comprehensive metabolic panel     Status: Abnormal   Collection Time: 02/04/19  4:04 PM  Result Value Ref Range   Sodium 137 135 - 145 mmol/L   Potassium 4.0 3.5 - 5.1 mmol/L   Chloride 106 98 - 111 mmol/L   CO2 22 22 - 32 mmol/L   Glucose, Bld 107 (H) 70 - 99 mg/dL   BUN 27 (H) 8 - 23 mg/dL   Creatinine, Ser 0.91 0.44 - 1.00 mg/dL   Calcium 9.2  8.9 - 10.3 mg/dL   Total Protein 7.7 6.5 - 8.1 g/dL   Albumin 4.0 3.5 - 5.0 g/dL   AST 12 (L) 15 - 41 U/L   ALT 9 0 - 44 U/L   Alkaline Phosphatase 97 38 - 126 U/L   Total Bilirubin 0.4 0.3 - 1.2 mg/dL   GFR calc non Af Amer >60 >60 mL/min   GFR calc Af Amer >60 >60 mL/min   Anion gap 9 5 - 15    Comment: Performed at [facility], [address] [place], [place] [address]  CBC     Status: Abnormal   Collection Time: 02/04/19  4:04 PM  Result Value Ref Range   WBC 6.1 4.0 - 10.5 K/uL   RBC 4.26 3.87 - 5.11 MIL/uL   Hemoglobin 11.2 (L) 12.0 - 15.0 g/dL   HCT 37.5 36.0 - 46.0 %   MCV 88.0 80.0 - 100.0 fL   MCH 26.3 26.0 - 34.0 pg   MCHC 29.9 (L) 30.0 - 36.0 g/dL   RDW 16.3 (H) 11.5 - 15.5 %   Platelets 229 150 - 400 K/uL    nRBC 0.0 0.0 - 0.2 %    Comment: Performed at [facility], [place], [place] [address]  Troponin I (High Sensitivity)     Status: None   Collection Time: 02/04/19  9:00 PM  Result Value Ref Range   Troponin I (High Sensitivity) 7 <18 ng/L    Comment: (NOTE) Elevated high sensitivity troponin I (hsTnI) values and significant  changes across serial measurements may suggest ACS but many other  chronic and acute conditions are known to elevate hsTnI results.  Refer to the "Links" section for chest pain algorithms and additional  guidance. Performed at [facility], [address] [place], [place] [address]   SARS Coronavirus 2 (CEPHEID - Performed in [facility] hospital lab), Hosp Order     Status: None   Collection Time: 02/04/19 10:19 PM   Specimen: Nasopharyngeal Swab  Result Value Ref Range   SARS Coronavirus 2 NEGATIVE NEGATIVE    Comment: (NOTE) If result is NEGATIVE SARS-CoV-2 target nucleic acids are NOT DETECTED. The SARS-CoV-2 RNA is generally detectable in upper and lower  respiratory specimens during the acute phase of infection. The lowest  concentration of SARS-CoV-2 viral copies this assay can detect is 250  copies / mL. A negative result does not preclude SARS-CoV-2 infection  and should not be used as the sole basis for treatment or other  patient management decisions.  A negative result may occur with  improper specimen collection / handling, submission of specimen other  than nasopharyngeal swab, presence of viral mutation(s) within the  areas targeted by this assay, and inadequate number of viral copies  (<250 copies / mL). A negative result must be combined with clinical  observations, patient history, and epidemiological information. If result is POSITIVE SARS-CoV-2 target nucleic acids are DETECTED. The SARS-CoV-2 RNA is generally detectable in upper and lower  respiratory specimens dur ing the acute phase of infection.   Positive  results are indicative of active infection with SARS-CoV-2.  Clinical  correlation with patient history and other diagnostic information is  necessary to determine patient infection status.  Positive results do  not rule out bacterial infection or co-infection with other viruses. If result is PRESUMPTIVE POSTIVE SARS-CoV-2 nucleic acids MAY BE PRESENT.   A presumptive positive result was obtained on the submitted specimen  and confirmed  on repeat testing.  While 2019 novel coronavirus  (SARS-CoV-2) nucleic acids may be present in the submitted sample  additional confirmatory testing may be necessary for epidemiological  and / or clinical management purposes  to differentiate between  SARS-CoV-2 and other Sarbecovirus currently known to infect humans.  If clinically indicated additional testing with an alternate test  methodology [phone]) is advised. The SARS-CoV-2 RNA is generally  detectable in upper and lower respiratory sp ecimens during the acute  phase of infection. The expected result is Negative. Fact Sheet for Patients:  StrictlyIdeas.no Fact Sheet for Healthcare Providers: BankingDealers.co.za This test is not yet approved or cleared by the [place] FDA and has been authorized for detection and/or diagnosis of SARS-CoV-2 by FDA under an Emergency Use Authorization (EUA).  This EUA will remain in effect (meaning this test can be used) for the duration of the COVID-19 declaration under Section 564(b)(1) of the Act, 21 U.S.C. section 360bbb-3(b)(1), unless the authorization is terminated or revoked sooner. Performed at [facility], [place], [place] [address]    Ct Angio Head W Or Wo Contrast  Result Date: 02/04/2019 CLINICAL DATA:  Headache today. EXAM: CT ANGIOGRAPHY HEAD TECHNIQUE: Multidetector CT imaging of the head was performed using the standard protocol during bolus  administration of intravenous contrast. Multiplanar CT image reconstructions and MIPs were obtained to evaluate the vascular anatomy. CONTRAST:  144mL ISOVUE-370 IOPAMIDOL (ISOVUE-370) INJECTION 76% COMPARISON:  CT 12/21/2014 FINDINGS: CT HEAD Brain: The brain shows a normal appearance without evidence of malformation, atrophy, old or acute small or large vessel infarction, mass lesion, hemorrhage, hydrocephalus or extra-axial collection. Vascular: No hyperdense vessel. No evidence of atherosclerotic calcification. Skull: Normal.  No traumatic finding.  No focal bone lesion. Sinuses/Orbits: Sinuses are clear. Orbits appear normal. Mastoids are clear. Other: None significant CTA HEAD Anterior circulation: Both internal carotid arteries are patent through the skull base and siphon regions. No siphon stenosis. The anterior and middle cerebral vessels are patent without proximal stenosis, aneurysm or vascular malformation. Posterior circulation: Both vertebral arteries are patent through the foramen magnum to the basilar. No basilar stenosis. No posterior circulation aneurysm. Diminutive posterior circulation due to primary fetal origin of both posterior cerebral arteries from the anterior circulation. Venous sinuses: Patent and normal. Anatomic variants: None other significant. Delayed phase: Not performed. IMPRESSION: Normal head CT. Normal CT angiography for age. No evidence of aneurysm, stenosis or occlusion. Electronically Signed   By: Nelson Chimes M.D.   On: 02/04/2019 20:07   Ct Abdomen Pelvis W Contrast  Result Date: 02/04/2019 CLINICAL DATA:  Abdominal pain today. EXAM: CT ABDOMEN AND PELVIS WITH CONTRAST TECHNIQUE: Multidetector CT imaging of the abdomen and pelvis was performed using the standard protocol following bolus administration of intravenous contrast. CONTRAST:  100 cc Isovue 370 IV COMPARISON:  CT 01/13/2019 FINDINGS: Lower chest: Scattered pulmonary cysts, chronic and unchanged. Lingular  atelectasis or scarring. Mild cardiomegaly. Small pericardial effusion which is chronic. Hepatobiliary: Scattered subcentimeter hypodensities too small to characterize. Few calcified granuloma. Mild central intrahepatic biliary ductal dilatation which is unchanged from prior. Gallbladder physiologically distended. No calcified gallstone. No common bile duct dilatation. Pancreas: No ductal dilatation or inflammation. Spleen: Normal in size without focal abnormality. Adrenals/Urinary Tract: No adrenal nodule. Portions of the kidney are obscured by streak artifact from dense retained barium within diverticula, including a duodenal diverticulum. No hydronephrosis or perinephric edema. Homogeneous renal enhancement with symmetric excretion on delayed phase imaging. The subcentimeter left upper pole hypodensity  on exam 1 year ago is not as well-defined currently. Urinary bladder is physiologically distended without wall thickening. Stomach/Bowel: High-density barium within innumerable colonic diverticula as well as the duodenal diverticulum from upper GI 01/15/2019. This causes streak artifact on limited bowel evaluation. The stomach is nondistended. No bowel obstruction or evidence of inflammation. Rather extensive colonic diverticulosis without diverticulitis. No colonic wall thickening. High-riding cecum, unchanged from prior. Normal appendix, image 34 series 7. Vascular/Lymphatic: Abdominal aorta is normal in caliber. Portal vein is patent. No abdominopelvic adenopathy, allowing for multifocal streak artifact. Reproductive: Enlarged uterus with fibroids, some of which are calcified, unchanged in CT appearance from prior. No obvious adnexal mass. Other: Tiny fat containing umbilical hernia. No free air, free fluid, or intra-abdominal fluid collection. Musculoskeletal: There are no acute or suspicious osseous abnormalities. IMPRESSION: 1. No acute findings in the abdomen/pelvis. 2. Diffuse multifocal colonic  diverticulosis without diverticulitis. Many diverticula of retained high-density barium from recent upper GI with streak artifact slightly limiting evaluation. 3. Duodenal diverticulum with retained barium. 4. Additional stable chronic findings as described. Electronically Signed   By: Keith Rake M.D.   On: 02/04/2019 20:13    Pending Labs Unresulted Labs (From admission, onward)    Start     Ordered   02/05/19 0500  Comprehensive metabolic panel  Tomorrow morning,   STAT     02/04/19 2356   02/05/19 0500  CBC  Tomorrow morning,   STAT     02/04/19 2356   02/05/19 0500  Lipid panel  Tomorrow morning,   STAT     02/04/19 2357   02/04/19 1553  Urinalysis, Complete w Microscopic  ONCE - STAT,   STAT     02/04/19 1553          Vitals/Pain Today's Vitals   02/04/19 1552 02/04/19 2100 02/04/19 2349  BP: (!) 127/50 (!) 146/75 (!) 155/73  Pulse: 65 (!) 45 (!) 46  Resp: 16 18 20   Temp: 98.6 F (37 C)    TempSrc: Oral    SpO2: 100% 99% 99%  Weight: 112 kg    Height: 5\' 4"  (1.626 m)    PainSc: 5   0-No pain    Isolation Precautions No active isolations  Medications Medications  hydrALAZINE (APRESOLINE) tablet 25 mg (has no administration in time range)  metoCLOPramide (REGLAN) tablet 5 mg (has no administration in time range)  pantoprazole (PROTONIX) EC tablet 40 mg (has no administration in time range)  vitamin B-12 (CYANOCOBALAMIN) tablet 500 mcg (has no administration in time range)  enoxaparin (LOVENOX) injection 40 mg (has no administration in time range)  0.9 %  sodium chloride infusion (has no administration in time range)  acetaminophen (TYLENOL) tablet 650 mg (has no administration in time range)    Or  acetaminophen (TYLENOL) suppository 650 mg (has no administration in time range)  traZODone (DESYREL) tablet 25 mg (has no administration in time range)  magnesium hydroxide (MILK OF MAGNESIA) suspension 30 mL (has no administration in time range)  ondansetron  (ZOFRAN) tablet 4 mg (has no administration in time range)    Or  ondansetron (ZOFRAN) injection 4 mg (has no administration in time range)  aspirin EC tablet 81 mg (has no administration in time range)  simvastatin (ZOCOR) tablet 20 mg (has no administration in time range)  meclizine (ANTIVERT) tablet 12.5 mg (has no administration in time range)  iopamidol (ISOVUE-370) 76 % injection 100 mL (100 mLs Intravenous Contrast Given 02/04/19 1930)    Mobility walks  Low fall risk   Focused Assessments    R Recommendations: See Admitting Provider Note  Report given to:   Additional Notes:

## 2019-02-05 NOTE — TOC Transition Note (Signed)
Transition of Care [facility]) - CM/SW Discharge Note   Patient Details  Name: Taylor Johnson MRN: [id number] Date of Birth: 1946-05-15  Transition of [facility]) CM/SW Contact:  Katrina Stack, RN Phone Number: 02/05/2019, 6:55 PM   Clinical Narrative:    Placed in observation for bradycardia and dizziness. Work up thus far negative. Patient currently receiving home health services through [facility]- RN PT OT. Updated agency on observation status.         Patient Goals and CMS Choice        Discharge Placement                       Discharge Plan and Services                                     Social Determinants of Health (SDOH) Interventions     Readmission Risk Interventions No flowsheet data found.

## 2019-02-05 NOTE — Evaluation (Signed)
Physical Therapy Evaluation Patient Details Name: Taylor Johnson MRN: [id number] DOB: 24-Nov-1945 Today's Date: 02/05/2019   History of Present Illness  Pt is a 73 year old female admitted for complaints of weakness and dizziness. History includes developmental delay, asthma, and HTN. Pt with recent admission for rectal bleed.  Clinical Impression  Pt is a 73 year old female who lives with family who act as caregivers due to developmental deficits.  Pt is able to ambulate around home with occasional use of SPC at baseline.  Pt hesitant to participate with therapist at first but agreed to stand for BP reading and walk to door.  She did not present as hypotensive and stated that she did not feel "dizzy" but more so "tired" during activity.  Pt able to ambulate 20 ft in room with minimal use of RW.  She was able to stand and walk without RW but did appear unsteady.  Pt able to tolerate positioning for [facility] testing for short time before requesting to sit up, but symptoms of vertigo were reproduced on R side.  Pt in bed with nursing in room at PT departure.  Pt will continue to benefit from skilled PT with focus on strength, safe mobility and management of BPPV.    Follow Up Recommendations Home health PT;Supervision - Intermittent(Pt may require a home health PT with vestibular training.)    Equipment Recommendations  None recommended by PT    Recommendations for Other Services       Precautions / Restrictions Precautions Precautions: Fall Restrictions Weight Bearing Restrictions: No      Mobility  Bed Mobility Overal bed mobility: Modified Independent                Transfers Overall transfer level: Needs assistance Equipment used: Rolling walker (2 wheeled) Transfers: Sit to/from Stand Sit to Stand: Supervision         General transfer comment: Pt slightly unsteady but able to stand with use of UE's for support.  Ambulation/Gait Ambulation/Gait assistance:  Supervision Gait Distance (Feet): 20 Feet Assistive device: Rolling walker (2 wheeled)       General Gait Details: Low foot clearance and step length, fair use of RW without VC's.  Stairs            Wheelchair Mobility    Modified Rankin (Stroke Patients Only)       Balance Overall balance assessment: Needs assistance Sitting-balance support: Feet supported Sitting balance-Leahy Scale: Good     Standing balance support: Bilateral upper extremity supported Standing balance-Leahy Scale: Fair Standing balance comment: Able to perform static and dynamic mobility without RW but unsteady at this time.  Pt has a SPC at home which should be sufficient.                             Pertinent Vitals/Pain Pain Assessment: No/denies pain    Home Living Family/patient expects to be discharged to:: Private residence Living Arrangements: Parent Available Help at Discharge: Family(sister stays Sun-Thurs to help care for her mom) Type of Home: House Home Access: Ramped entrance     Home Layout: One level Home Equipment: Cane - single point      Prior Function Level of Independence: Independent         Comments: Walks in her yard and around house with [facility] when needed.  Stated that her sisters have told her to not walk as much and be careful about getting  too hot.  Pt not able to all questions about social hx.     Hand Dominance        Extremity/Trunk Assessment   Upper Extremity Assessment Upper Extremity Assessment: Generalized weakness    Lower Extremity Assessment Lower Extremity Assessment: Overall WFL for tasks assessed    Cervical / Trunk Assessment Cervical / Trunk Assessment: Normal  Communication   Communication: No difficulties  Cognition Arousal/Alertness: Awake/alert Behavior During Therapy: WFL for tasks assessed/performed Overall Cognitive Status: Within Functional Limits for tasks assessed                                         General Comments      Exercises Other Exercises Other Exercises: Marye Round performed with no nystagmus noted, though pt could not tolerate position for full 30 sec.  with symptoms reproduced on R side.   x5 min   Assessment/Plan    PT Assessment Patient needs continued PT services  PT Problem List Decreased strength;Decreased balance;Decreased knowledge of use of DME;Decreased activity tolerance       PT Treatment Interventions Gait training;DME instruction;Therapeutic exercise;Balance training;Manual techniques;Therapeutic activities;Functional mobility training;Patient/family education    PT Goals (Current goals can be found in the Care Plan section)  Acute Rehab PT Goals Patient Stated Goal: to go home PT Goal Formulation: With patient Time For Goal Achievement: 02/12/19 Potential to Achieve Goals: Good    Frequency Min 2X/week   Barriers to discharge        Co-evaluation               AM-PAC PT "6 Clicks" Mobility  Outcome Measure Help needed turning from your back to your side while in a flat bed without using bedrails?: None Help needed moving from lying on your back to sitting on the side of a flat bed without using bedrails?: None Help needed moving to and from a bed to a chair (including a wheelchair)?: A Little Help needed standing up from a chair using your arms (e.g., wheelchair or bedside chair)?: A Little Help needed to walk in hospital room?: A Little Help needed climbing 3-5 steps with a railing? : A Lot 6 Click Score: 19    End of Session Equipment Utilized During Treatment: Gait belt Activity Tolerance: Patient tolerated treatment well Patient left: in bed;with nursing/sitter in room;with call bell/phone within reach Nurse Communication: Mobility status PT Visit Diagnosis: Muscle weakness (generalized) (M62.81);History of falling (Z91.81);BPPV;Difficulty in walking, not elsewhere classified (R26.2) BPPV - Right/Left : Right     Time: 1315-1340 PT Time Calculation (min) (ACUTE ONLY): 25 min   Charges:   PT Evaluation $PT Eval Low Complexity: 1 Low PT Treatments $Therapeutic Activity: 8-22 mins        Roxanne Gates, PT, DPT   Roxanne Gates 02/05/2019, 2:44 PM

## 2019-02-05 NOTE — Progress Notes (Signed)
PHARMACIST - PHYSICIAN COMMUNICATION  CONCERNING:  Enoxaparin (Lovenox) for DVT Prophylaxis   RECOMMENDATION: Patient was prescribed enoxaprin 40mg  q24 hours for VTE prophylaxis.   Filed Weights   02/04/19 1552  Weight: 246 lb 14.6 oz (112 kg)    Body mass index is 42.38 kg/m.  Estimated Creatinine Clearance: 68.5 mL/min (by C-G formula based on SCr of 0.91 mg/dL).  Based on [place] patient is candidate for enoxaparin 40mg  every 12 hour dosing due to BMI being >40.  DESCRIPTION: Pharmacy has adjusted enoxaparin dose per [facility] policy.  Patient is now receiving enoxaparin 40mg  every 12 hours.   Pernell Dupre, PharmD, BCPS Clinical Pharmacist 02/05/2019 12:12 AM

## 2019-02-05 NOTE — Progress Notes (Signed)
Patient is admitted this morning for dizziness, symptomatic bradycardia, heart rate now is better up to 68 bpm.  Patient main complaint today morning is abdominal pain when I saw the patient.  Dizziness improved.  1.  Dizziness likely positional vertigo, on meclizine.  Patient symptomatic bradycardia improved.  COVID-19 test is negative. 2.  Essential hypertension: Controlled, continue hydralazine 3.  Abdominal pain, patient has chronic abdominal pain, history of upper GI series showed mild gastroesophageal reflux. Likely discharge home tomorrow.

## 2019-02-06 ENCOUNTER — Observation Stay: Payer: Medicare Other

## 2019-02-06 DIAGNOSIS — R42 Dizziness and giddiness: Secondary | ICD-10-CM | POA: Diagnosis not present

## 2019-02-06 LAB — ECHOCARDIOGRAM COMPLETE
Height: 64 in
Weight: 3884.8 oz

## 2019-02-06 MED ORDER — AMOXICILLIN-POT CLAVULANATE 875-125 MG PO TABS
1.0000 | ORAL_TABLET | Freq: Two times a day (BID) | ORAL | Status: DC
  Administered 2019-02-06: 1 via ORAL
  Filled 2019-02-06: qty 1

## 2019-02-06 MED ORDER — MECLIZINE HCL 12.5 MG PO TABS: 12.5000 mg | ORAL_TABLET | Freq: Three times a day (TID) | ORAL | 0 refills | Status: DC | PRN

## 2019-02-06 MED ORDER — ASPIRIN 81 MG PO TBEC: 81.0000 mg | DELAYED_RELEASE_TABLET | Freq: Every day | ORAL | 0 refills | Status: DC

## 2019-02-06 MED ORDER — AMOXICILLIN-POT CLAVULANATE 875-125 MG PO TABS: 1.0000 | ORAL_TABLET | Freq: Two times a day (BID) | ORAL | 0 refills | Status: DC

## 2019-02-06 NOTE — Plan of Care (Signed)
Pt ready for discharge home.   Problem: Education: Goal: Knowledge of General Education information will improve Description: Including pain rating scale, medication(s)/side effects and non-pharmacologic comfort measures 02/06/2019 1617 by Aubery Lapping, RN Outcome: Completed/Met 02/06/2019 1351 by Aubery Lapping, RN Outcome: Progressing   Problem: Health Behavior/Discharge Planning: Goal: Ability to manage health-related needs will improve 02/06/2019 1617 by Aubery Lapping, RN Outcome: Completed/Met 02/06/2019 1351 by Aubery Lapping, RN Outcome: Progressing   Problem: Clinical Measurements: Goal: Ability to maintain clinical measurements within normal limits will improve 02/06/2019 1617 by Aubery Lapping, RN Outcome: Completed/Met 02/06/2019 1351 by Aubery Lapping, RN Outcome: Progressing Goal: Will remain free from infection 02/06/2019 1617 by Aubery Lapping, RN Outcome: Completed/Met 02/06/2019 1351 by Aubery Lapping, RN Outcome: Progressing Goal: Diagnostic test results will improve 02/06/2019 1617 by Aubery Lapping, RN Outcome: Completed/Met 02/06/2019 1351 by Aubery Lapping, RN Outcome: Progressing Goal: Respiratory complications will improve 02/06/2019 1617 by Aubery Lapping, RN Outcome: Completed/Met 02/06/2019 1351 by Aubery Lapping, RN Outcome: Progressing Goal: Cardiovascular complication will be avoided 02/06/2019 1617 by Aubery Lapping, RN Outcome: Completed/Met 02/06/2019 1351 by Aubery Lapping, RN Outcome: Progressing   Problem: Activity: Goal: Risk for activity intolerance will decrease 02/06/2019 1617 by Aubery Lapping, RN Outcome: Completed/Met 02/06/2019 1351 by Aubery Lapping, RN Outcome: Progressing   Problem: Nutrition: Goal: Adequate nutrition will be maintained 02/06/2019 1617 by Aubery Lapping, RN Outcome: Completed/Met 02/06/2019 1351 by Aubery Lapping,  RN Outcome: Progressing   Problem: Coping: Goal: Level of anxiety will decrease 02/06/2019 1617 by Aubery Lapping, RN Outcome: Completed/Met 02/06/2019 1351 by Aubery Lapping, RN Outcome: Progressing   Problem: Elimination: Goal: Will not experience complications related to bowel motility 02/06/2019 1617 by Aubery Lapping, RN Outcome: Completed/Met 02/06/2019 1351 by Aubery Lapping, RN Outcome: Progressing Goal: Will not experience complications related to urinary retention 02/06/2019 1617 by Aubery Lapping, RN Outcome: Completed/Met 02/06/2019 1351 by Aubery Lapping, RN Outcome: Progressing   Problem: Pain Managment: Goal: General experience of comfort will improve 02/06/2019 1617 by Aubery Lapping, RN Outcome: Completed/Met 02/06/2019 1351 by Aubery Lapping, RN Outcome: Progressing   Problem: Safety: Goal: Ability to remain free from injury will improve 02/06/2019 1617 by Aubery Lapping, RN Outcome: Completed/Met 02/06/2019 1351 by Aubery Lapping, RN Outcome: Progressing   Problem: Skin Integrity: Goal: Risk for impaired skin integrity will decrease 02/06/2019 1617 by Aubery Lapping, RN Outcome: Completed/Met 02/06/2019 1351 by Aubery Lapping, RN Outcome: Progressing   Problem: Acute Rehab PT Goals(only PT should resolve) Goal: Pt Will Perform Standing Balance Or Pre-Gait Outcome: Completed/Met Goal: Pt Will Ambulate Outcome: Completed/Met Goal: Pt/caregiver will Perform Home Exercise Program Outcome: Completed/Met

## 2019-02-06 NOTE — Progress Notes (Signed)
Called by RN to remove pt from Cpap per pt request.

## 2019-02-06 NOTE — TOC Transition Note (Signed)
Transition of Care [facility]) - CM/SW Discharge Note   Patient Details  Name: Taylor Johnson MRN: [id number] Date of Birth: Dec 25, 1945  Transition of [facility] CM/SW Contact:  Beverly Sessions, RN Phone Number: 02/06/2019, 5:41 PM   Clinical Narrative:    Patient was discharged home today  [place] with [facility] notified of discharge and resumption orders   Final next level of care: [place] Barriers to Discharge: Barriers Resolved   Patient Goals and CMS Choice        Discharge Placement                       Discharge Plan and Services                          HH Arranged: RN, PT, OT [facility] Agency: [facility] Date [place]: 02/06/19 Time [place]: [place] Representative spoke with at [place]: [place] ([place]) Interventions     Readmission Risk Interventions No flowsheet data found.

## 2019-02-06 NOTE — Plan of Care (Signed)
  Problem: Health Behavior/Discharge Planning: Goal: Ability to manage health-related needs will improve Outcome: Progressing   Problem: Clinical Measurements: Goal: Ability to maintain clinical measurements within normal limits will improve Outcome: Progressing Goal: Will remain free from infection Outcome: Progressing Goal: Diagnostic test results will improve Outcome: Progressing   Problem: Elimination: Goal: Will not experience complications related to urinary retention Outcome: Progressing   Problem: Skin Integrity: Goal: Risk for impaired skin integrity will decrease Outcome: Progressing

## 2019-02-06 NOTE — Progress Notes (Signed)
Family updated regarding pt's status.  MRI contacted regarding pt willing to have head MRI. Order is still active.

## 2019-02-06 NOTE — Discharge Summary (Signed)
[place] [facility] [place] NAME: Taylor Johnson    MR#:  [id number]  DATE OF BIRTH:  1946/07/02  DATE OF ADMISSION:  02/04/2019 ADMITTING PHYSICIAN: Christel Mormon, MD  DATE OF DISCHARGE: 02/06/2019  PRIMARY CARE PHYSICIAN: [name], MD    ADMISSION DIAGNOSIS:  Dizziness [R42] Generalized abdominal pain [R10.84]  DISCHARGE DIAGNOSIS:  Active Problems:   Dizziness   SECONDARY DIAGNOSIS:   Past Medical History:  Diagnosis Date  . Abdominal pain 01/27/2017  . Anxiety   . Anxiety, generalized 05/11/2015  . Asthma   . Cough 06/02/2016   Chronic - followed by Pulmonary  . Depression   . Developmental delay   . Diverticulitis   . Diverticulosis of colon 05/11/2015  . Dysphagia 10/15/2015   Routine Ba Swallow normal; rec modified barium swallow   . Hypoxia 09/12/2015  . Leg swelling   . Localized edema 04/15/2016  . Microscopic hematuria 04/15/2016  . Nausea and vomiting 01/29/2017  . Orthostatic hypotension 03/16/2017  . OSA on CPAP 11/18/2015   CPAP @ 18 cm H2O started 02/2016  . Rectal bleeding 10/15/2018  . Urge incontinence of urine 05/12/2015    HOSPITAL COURSE:   1.  Dizziness with vertigo.  MRI of the brain negative for stroke.  Patient started on aspirin anyway.  Trial of Antivert.  For her otitis media I did prescribe Augmentin which should help.  Home health physical therapy.  If balance does not improve after course of Augmentin and home health physical therapy I did give her a prescription for neck step therapy and balance center.  Carotid ultrasound negative.  Echocardiogram still pending 2.  Bradycardia.  Heart rate in the 60s 3.  GERD on PPI 4.  B12 deficiency on vitamin B12 5.  Chronic abdominal pain.  Had CT scan of the abdomen and pelvis last time which was negative.  If this is a mild diverticulitis Augmentin should help.  DISCHARGE CONDITIONS:   Satisfactory    DRUG ALLERGIES:  No Known Allergies  DISCHARGE  MEDICATIONS:   Allergies as of 02/06/2019   No Known Allergies     Medication List    TAKE these medications   amoxicillin-clavulanate 875-125 MG tablet Commonly known as: AUGMENTIN Take 1 tablet by mouth every 12 (twelve) hours.   aspirin 81 MG EC tablet Take 1 tablet (81 mg total) by mouth daily. Start taking on: February 07, 2019   hydrALAZINE 25 MG tablet Commonly known as: APRESOLINE Take 1 tablet (25 mg total) by mouth 3 (three) times daily.   meclizine 12.5 MG tablet Commonly known as: ANTIVERT Take 1 tablet (12.5 mg total) by mouth 3 (three) times daily as needed for dizziness.   metoCLOPramide 5 MG tablet Commonly known as: REGLAN Take 1 tablet (5 mg total) by mouth 3 (three) times daily before meals.   pantoprazole 40 MG tablet Commonly known as: Protonix Take 1 tablet (40 mg total) by mouth daily.   vitamin B-12 500 MCG tablet Commonly known as: CYANOCOBALAMIN Take 500 mcg by mouth daily.        DISCHARGE INSTRUCTIONS:   Follow-up PMD 5 days  If you experience worsening of your admission symptoms, develop shortness of breath, life threatening emergency, suicidal or homicidal thoughts you must seek medical attention immediately by calling 911 or calling your MD immediately  if symptoms less severe.  You Must read complete instructions/literature along with all the possible adverse reactions/side effects for all the Medicines  you take and that have been prescribed to you. Take any new Medicines after you have completely understood and accept all the possible adverse reactions/side effects.   Please note  You were cared for by a hospitalist during your hospital stay. If you have any questions about your discharge medications or the care you received while you were in the hospital after you are discharged, you can call the unit and asked to speak with the hospitalist on call if the hospitalist that took care of you is not available. Once you are discharged, your  primary care physician will handle any further medical issues. Please note that NO REFILLS for any discharge medications will be authorized once you are discharged, as it is imperative that you return to your primary care physician (or establish a relationship with a primary care physician if you do not have one) for your aftercare needs so that they can reassess your need for medications and monitor your lab values.    Today   CHIEF COMPLAINT:   Chief Complaint  Patient presents with  . Abdominal Pain  . Headache    HISTORY OF PRESENT ILLNESS:  Taylor Johnson  is a 73 y.o. female coming in with dizziness   VITAL SIGNS:  Blood pressure (!) 146/60, pulse (!) 57, temperature 97.8 F (36.6 C), temperature source Oral, resp. rate 19, height 5\' 4"  (1.626 m), weight 110.1 kg, SpO2 100 %.    PHYSICAL EXAMINATION:  GENERAL:  73 y.o.-year-old patient lying in the bed with no acute distress.  EYES: Pupils equal, round, reactive to light and accommodation. No scleral icterus. Extraocular muscles intact.  HEENT: Head atraumatic, normocephalic. Oropharynx and nasopharynx clear.  NECK:  Supple, no jugular venous distention. No thyroid enlargement, no tenderness.  LUNGS: Normal breath sounds bilaterally, no wheezing, rales,rhonchi or crepitation. No use of accessory muscles of respiration.  CARDIOVASCULAR: S1, S2 normal. No murmurs, rubs, or gallops.  ABDOMEN: Soft, non-tender, non-distended. Bowel sounds present. No organomegaly or mass.  EXTREMITIES: No pedal edema, cyanosis, or clubbing.  NEUROLOGIC: Cranial nerves II through XII are intact. Muscle strength 5/5 in all extremities. Sensation intact. Gait not checked.  PSYCHIATRIC: The patient is alert and oriented x 3.  SKIN: No obvious rash, lesion, or ulcer.   DATA REVIEW:   CBC Recent Labs  Lab 02/05/19 0154  WBC 5.9  HGB 12.0  HCT 39.6  PLT 223    Chemistries  Recent Labs  Lab 02/05/19 0154  NA 138  K 3.6  CL 106  CO2 25   GLUCOSE 100*  BUN 21  CREATININE 0.96  CALCIUM 9.1  AST 14*  ALT 10  ALKPHOS 96  BILITOT 0.3     Microbiology Results  Results for orders placed or performed during the hospital encounter of 02/04/19  SARS Coronavirus 2 (CEPHEID - Performed in [facility] hospital lab), Hosp Order     Status: None   Collection Time: 02/04/19 10:19 PM   Specimen: Nasopharyngeal Swab  Result Value Ref Range Status   SARS Coronavirus 2 NEGATIVE NEGATIVE Final    Comment: (NOTE) If result is NEGATIVE SARS-CoV-2 target nucleic acids are NOT DETECTED. The SARS-CoV-2 RNA is generally detectable in upper and lower  respiratory specimens during the acute phase of infection. The lowest  concentration of SARS-CoV-2 viral copies this assay can detect is 250  copies / mL. A negative result does not preclude SARS-CoV-2 infection  and should not be used as the sole basis for treatment or other  patient management decisions.  A negative result may occur with  improper specimen collection / handling, submission of specimen other  than nasopharyngeal swab, presence of viral mutation(s) within the  areas targeted by this assay, and inadequate number of viral copies  (<250 copies / mL). A negative result must be combined with clinical  observations, patient history, and epidemiological information. If result is POSITIVE SARS-CoV-2 target nucleic acids are DETECTED. The SARS-CoV-2 RNA is generally detectable in upper and lower  respiratory specimens dur ing the acute phase of infection.  Positive  results are indicative of active infection with SARS-CoV-2.  Clinical  correlation with patient history and other diagnostic information is  necessary to determine patient infection status.  Positive results do  not rule out bacterial infection or co-infection with other viruses. If result is PRESUMPTIVE POSTIVE SARS-CoV-2 nucleic acids MAY BE PRESENT.   A presumptive positive result was obtained on the submitted  specimen  and confirmed on repeat testing.  While 2019 novel coronavirus  (SARS-CoV-2) nucleic acids may be present in the submitted sample  additional confirmatory testing may be necessary for epidemiological  and / or clinical management purposes  to differentiate between  SARS-CoV-2 and other Sarbecovirus currently known to infect humans.  If clinically indicated additional testing with an alternate test  methodology [phone]) is advised. The SARS-CoV-2 RNA is generally  detectable in upper and lower respiratory sp ecimens during the acute  phase of infection. The expected result is Negative. Fact Sheet for Patients:  StrictlyIdeas.no Fact Sheet for Healthcare Providers: BankingDealers.co.za This test is not yet approved or cleared by the [place] FDA and has been authorized for detection and/or diagnosis of SARS-CoV-2 by FDA under an Emergency Use Authorization (EUA).  This EUA will remain in effect (meaning this test can be used) for the duration of the COVID-19 declaration under Section 564(b)(1) of the Act, 21 U.S.C. section 360bbb-3(b)(1), unless the authorization is terminated or revoked sooner. Performed at [facility], [address], [place], [place] [address]     RADIOLOGY:  Ct Angio Head W Or Wo Contrast  Result Date: 02/04/2019 CLINICAL DATA:  Headache today. EXAM: CT ANGIOGRAPHY HEAD TECHNIQUE: Multidetector CT imaging of the head was performed using the standard protocol during bolus administration of intravenous contrast. Multiplanar CT image reconstructions and MIPs were obtained to evaluate the vascular anatomy. CONTRAST:  12mL ISOVUE-370 IOPAMIDOL (ISOVUE-370) INJECTION 76% COMPARISON:  CT 12/21/2014 FINDINGS: CT HEAD Brain: The brain shows a normal appearance without evidence of malformation, atrophy, old or acute small or large vessel infarction, mass lesion, hemorrhage, hydrocephalus or extra-axial  collection. Vascular: No hyperdense vessel. No evidence of atherosclerotic calcification. Skull: Normal.  No traumatic finding.  No focal bone lesion. Sinuses/Orbits: Sinuses are clear. Orbits appear normal. Mastoids are clear. Other: None significant CTA HEAD Anterior circulation: Both internal carotid arteries are patent through the skull base and siphon regions. No siphon stenosis. The anterior and middle cerebral vessels are patent without proximal stenosis, aneurysm or vascular malformation. Posterior circulation: Both vertebral arteries are patent through the foramen magnum to the basilar. No basilar stenosis. No posterior circulation aneurysm. Diminutive posterior circulation due to primary fetal origin of both posterior cerebral arteries from the anterior circulation. Venous sinuses: Patent and normal. Anatomic variants: None other significant. Delayed phase: Not performed. IMPRESSION: Normal head CT. Normal CT angiography for age. No evidence of aneurysm, stenosis or occlusion. Electronically Signed   By: Nelson Chimes M.D.   On: 02/04/2019 20:07  Mr Brain Wo Contrast  Result Date: 02/06/2019 CLINICAL DATA:  Central vertigo persistent EXAM: MRI HEAD WITHOUT CONTRAST TECHNIQUE: Multiplanar, multiecho pulse sequences of the brain and surrounding structures were obtained without intravenous contrast. COMPARISON:  CT a head 02/04/2019 FINDINGS: Brain: Ventricle size and cerebral volume normal. Negative for acute infarct. Minimal chronic changes in the white matter. Negative for hemorrhage mass or edema. No fluid collection or midline shift. Vascular: Normal arterial flow voids Skull and upper cervical spine: Negative Sinuses/Orbits: Negative Other: Image quality degraded by motion IMPRESSION: Normal for age MRI brain.  No acute abnormality. Electronically Signed   By: Franchot Gallo M.D.   On: 02/06/2019 14:20   Ct Abdomen Pelvis W Contrast  Result Date: 02/04/2019 CLINICAL DATA:  Abdominal pain today.  EXAM: CT ABDOMEN AND PELVIS WITH CONTRAST TECHNIQUE: Multidetector CT imaging of the abdomen and pelvis was performed using the standard protocol following bolus administration of intravenous contrast. CONTRAST:  100 cc Isovue 370 IV COMPARISON:  CT 01/13/2019 FINDINGS: Lower chest: Scattered pulmonary cysts, chronic and unchanged. Lingular atelectasis or scarring. Mild cardiomegaly. Small pericardial effusion which is chronic. Hepatobiliary: Scattered subcentimeter hypodensities too small to characterize. Few calcified granuloma. Mild central intrahepatic biliary ductal dilatation which is unchanged from prior. Gallbladder physiologically distended. No calcified gallstone. No common bile duct dilatation. Pancreas: No ductal dilatation or inflammation. Spleen: Normal in size without focal abnormality. Adrenals/Urinary Tract: No adrenal nodule. Portions of the kidney are obscured by streak artifact from dense retained barium within diverticula, including a duodenal diverticulum. No hydronephrosis or perinephric edema. Homogeneous renal enhancement with symmetric excretion on delayed phase imaging. The subcentimeter left upper pole hypodensity on exam 1 year ago is not as well-defined currently. Urinary bladder is physiologically distended without wall thickening. Stomach/Bowel: High-density barium within innumerable colonic diverticula as well as the duodenal diverticulum from upper GI 01/15/2019. This causes streak artifact on limited bowel evaluation. The stomach is nondistended. No bowel obstruction or evidence of inflammation. Rather extensive colonic diverticulosis without diverticulitis. No colonic wall thickening. High-riding cecum, unchanged from prior. Normal appendix, image 34 series 7. Vascular/Lymphatic: Abdominal aorta is normal in caliber. Portal vein is patent. No abdominopelvic adenopathy, allowing for multifocal streak artifact. Reproductive: Enlarged uterus with fibroids, some of which are  calcified, unchanged in CT appearance from prior. No obvious adnexal mass. Other: Tiny fat containing umbilical hernia. No free air, free fluid, or intra-abdominal fluid collection. Musculoskeletal: There are no acute or suspicious osseous abnormalities. IMPRESSION: 1. No acute findings in the abdomen/pelvis. 2. Diffuse multifocal colonic diverticulosis without diverticulitis. Many diverticula of retained high-density barium from recent upper GI with streak artifact slightly limiting evaluation. 3. Duodenal diverticulum with retained barium. 4. Additional stable chronic findings as described. Electronically Signed   By: Keith Rake M.D.   On: 02/04/2019 20:13   US Carotid Bilateral  Result Date: 02/05/2019 CLINICAL DATA:  73 year old female with a history of vertigo EXAM: BILATERAL CAROTID DUPLEX ULTRASOUND TECHNIQUE: Pearline Cables scale imaging, color Doppler and duplex ultrasound were performed of bilateral carotid and vertebral arteries in the neck. COMPARISON:  None. FINDINGS: Criteria: Quantification of carotid stenosis is based on velocity parameters that correlate the residual internal carotid diameter with NASCET-based stenosis levels, using the diameter of the distal internal carotid lumen as the denominator for stenosis measurement. The following velocity measurements were obtained: RIGHT ICA:  Systolic 379 cm/sec, Diastolic 36 cm/sec CCA:  78 cm/sec SYSTOLIC ICA/CCA RATIO:  1.0 ECA:  88 cm/sec LEFT ICA:  Systolic 024 cm/sec, Diastolic  56 cm/sec CCA:  741 cm/sec SYSTOLIC ICA/CCA RATIO:  1.0 ECA:  59 cm/sec Right Brachial SBP: Not acquired Left Brachial SBP: Not acquired RIGHT CAROTID ARTERY: No significant calcified disease of the right common carotid artery. Intermediate waveform maintained. Homogeneous plaque without significant calcifications at the right carotid bifurcation. Low resistance waveform of the right ICA. No significant tortuosity. RIGHT VERTEBRAL ARTERY: Antegrade flow with low resistance  waveform. LEFT CAROTID ARTERY: No significant calcified disease of the left common carotid artery. Intermediate waveform maintained. Homogeneous plaque at the left carotid bifurcation without significant calcifications. Low resistance waveform of the left ICA. LEFT VERTEBRAL ARTERY:  Antegrade flow with low resistance waveform. IMPRESSION: Color duplex indicates minimal homogeneous plaque, with no hemodynamically significant stenosis by duplex criteria in the extracranial cerebrovascular circulation. Signed, Dulcy Fanny. Dellia Nims, RPVI [facility] Radiology Electronically Signed   By: Corrie Mckusick D.O.   On: 02/05/2019 10:34      Management plans discussed with the patient, family and they are in agreement.  CODE STATUS:     Code Status Orders  (From admission, onward)         Start     Ordered   02/04/19 2352  Full code  Continuous     02/04/19 2356        Code Status History    Date Active Date Inactive Code Status Order ID Comments User Context   01/14/2019 0841 01/16/2019 1456 Full Code [id number]  Harrie Foreman, MD ED   10/15/2018 2255 10/18/2018 1517 Full Code [id number]  Marygrace Drought, MD Inpatient   01/27/2017 1045 02/02/2017 2046 Full Code [id number]  [name], MD Inpatient   09/12/2015 0838 09/13/2015 1557 Full Code [id number]  Bettey Costa, MD Inpatient   Advance Care Planning Activity      TOTAL TIME TAKING CARE OF THIS PATIENT: 35 minutes.    Loletha Grayer M.D on 02/06/2019 at 3:26 PM  Between 7am to 6pm - Pager - [phone]  After 6pm go to [email or web address] - password [email or web address] [facility]  [phone]  CC: Primary care physician; [name], MD

## 2019-02-06 NOTE — Progress Notes (Signed)
Writer spoke with Taylor Johnson's emergency contact, Callie, regarding transportation home. Taylor Johnson is ready for discharge.

## 2019-02-06 NOTE — Discharge Instructions (Signed)

## 2019-02-06 NOTE — Plan of Care (Signed)

## 2019-02-21 ENCOUNTER — Other Ambulatory Visit: Payer: Self-pay

## 2019-02-21 DIAGNOSIS — K2970 Gastritis, unspecified, without bleeding: Secondary | ICD-10-CM

## 2019-02-21 DIAGNOSIS — R030 Elevated blood-pressure reading, without diagnosis of hypertension: Secondary | ICD-10-CM

## 2019-02-21 MED ORDER — METOCLOPRAMIDE HCL 5 MG PO TABS: 5.0000 mg | ORAL_TABLET | Freq: Three times a day (TID) | ORAL | 5 refills | Status: DC

## 2019-02-21 MED ORDER — HYDRALAZINE HCL 25 MG PO TABS: 25.0000 mg | ORAL_TABLET | Freq: Three times a day (TID) | ORAL | 5 refills | Status: DC

## 2019-02-21 MED ORDER — ASPIRIN 81 MG PO TBEC: 81.0000 mg | DELAYED_RELEASE_TABLET | Freq: Every day | ORAL | 5 refills | Status: DC

## 2019-02-21 MED ORDER — MECLIZINE HCL 12.5 MG PO TABS: 12.5000 mg | ORAL_TABLET | Freq: Three times a day (TID) | ORAL | 5 refills | Status: DC | PRN

## 2019-03-04 ENCOUNTER — Other Ambulatory Visit: Payer: Self-pay

## 2019-03-04 ENCOUNTER — Ambulatory Visit (INDEPENDENT_AMBULATORY_CARE_PROVIDER_SITE_OTHER): Payer: Medicare Other | Admitting: Internal Medicine

## 2019-03-04 ENCOUNTER — Encounter: Payer: Self-pay | Admitting: Internal Medicine

## 2019-03-04 VITALS — BP 128/82 | HR 64 | Resp 16 | Ht 64.0 in | Wt 250.0 lb

## 2019-03-04 DIAGNOSIS — Z23 Encounter for immunization: Secondary | ICD-10-CM

## 2019-03-04 DIAGNOSIS — I10 Essential (primary) hypertension: Secondary | ICD-10-CM

## 2019-03-04 DIAGNOSIS — K5730 Diverticulosis of large intestine without perforation or abscess without bleeding: Secondary | ICD-10-CM

## 2019-03-04 DIAGNOSIS — R42 Dizziness and giddiness: Secondary | ICD-10-CM

## 2019-03-04 MED ORDER — SHINGRIX 50 MCG/0.5ML IM SUSR: 0.5000 mL | Freq: Once | INTRAMUSCULAR | 1 refills | Status: AC

## 2019-03-04 NOTE — Progress Notes (Signed)
Date:  03/04/2019   Name:  Taylor Johnson   DOB:  1946/03/19   MRN:  [id number]   Chief Complaint: Hypertension Pt is here for HTN follow up and to discuss hospital stay and visit on 01/2019. She has recovered with no further dizziness or abdominal pain.  Hypertension This is a new problem. The problem has been gradually worsening since onset. The problem is controlled (since starting hydralzine). Pertinent negatives include no chest pain, headaches, palpitations or shortness of breath. There are no associated agents to hypertension. Past treatments include nothing. The current treatment provides moderate improvement. There are no compliance problems.  There is no history of kidney disease, CAD/MI or CVA.  Abdominal Pain Chronicity: started after eating liver pudding but was thought to be diverticulitis after her CT was reviewed during admission. The problem has been resolved. The pain is located in the generalized abdominal region. Associated symptoms include constipation. Pertinent negatives include no diarrhea, fever or headaches. She has tried antibiotics (received a course of augmentin and pain is resolved) for the symptoms. Prior diagnostic workup includes CT scan.  Dizziness This is a new problem. The problem has been resolved. Pertinent negatives include no abdominal pain, chest pain, coughing, fever or headaches. Exacerbated by: pt unable to give good description of sx.  [facility]up at [facility] included MRI Brain, ECHO and Carotid US. ECHO:  1. The left ventricle has normal systolic function with an ejection fraction of 60-65%. The cavity size was normal. There is moderately increased left ventricular wall thickness. Left ventricular diastolic parameters were normal. No evidence of left  ventricular regional wall motion abnormalities.  2. The right ventricle has normal systolic function. The cavity was normal. There is no increase in right ventricular wall thickness. Right ventricular systolic  pressure is normal with an estimated pressure of 29.3 mmHg.  3. Trivial pericardial effusion is present.  4. There is mild to moderate mitral annular calcification present.  5. The aortic valve is tricuspid.  6. The aorta is normal in size and structure.  MRI Brain: IMPRESSION: Normal for age MRI brain.  No acute abnormality.  Carotid US: IMPRESSION: Color duplex indicates minimal homogeneous plaque, with no hemodynamically significant stenosis by duplex criteria in the extracranial cerebrovascular circulation.  Review of Systems  Constitutional: Negative for appetite change, fever and unexpected weight change.  HENT: Negative for ear pain and trouble swallowing.   Respiratory: Negative for cough, chest tightness, shortness of breath and wheezing.   Cardiovascular: Negative for chest pain, palpitations and leg swelling.  Gastrointestinal: Positive for constipation. Negative for abdominal pain, blood in stool and diarrhea.  Neurological: Negative for dizziness, light-headedness and headaches.  Psychiatric/Behavioral: Negative for dysphoric mood and sleep disturbance.    Patient Active Problem List   Diagnosis Date Noted  . Dizziness 02/04/2019  . Elevated blood pressure reading 10/23/2018  . Functional systolic murmur 25/85/2778  . Developmental delay disorder 02/06/2017  . Calculus of gallbladder without cholecystitis without obstruction 01/20/2017  . Cough 06/02/2016  . Localized edema 04/15/2016  . Microscopic hematuria 04/15/2016  . OSA on CPAP 11/18/2015  . Dysphagia 10/15/2015  . Urge incontinence of urine 05/12/2015  . Diverticulosis of colon 05/11/2015  . Anxiety, generalized 05/11/2015    No Known Allergies  Past Surgical History:  Procedure Laterality Date  . ABDOMINAL HYSTERECTOMY    . BREAST BIOPSY Left    neg  . COLON SURGERY    . ESOPHAGOGASTRODUODENOSCOPY (EGD) WITH PROPOFOL N/A 01/29/2017   Procedure: ESOPHAGOGASTRODUODENOSCOPY (  EGD) WITH PROPOFOL;   Surgeon: Wilford Corner, MD;  Location: [facility] ENDOSCOPY;  Service: Endoscopy;  Laterality: N/A;    Social History   Tobacco Use  . Smoking status: Never Smoker  . Smokeless tobacco: Never Used  . Tobacco comment: smoking cessation materials not required  Substance Use Topics  . Alcohol use: No    Alcohol/week: 0.0 standard drinks  . Drug use: No     Medication list has been reviewed and updated.  Current Meds  Medication Sig  . aspirin 81 MG EC tablet Take 1 tablet (81 mg total) by mouth daily.  . hydrALAZINE (APRESOLINE) 25 MG tablet Take 1 tablet (25 mg total) by mouth 3 (three) times daily.  . meclizine (ANTIVERT) 12.5 MG tablet Take 1 tablet (12.5 mg total) by mouth 3 (three) times daily as needed for dizziness.  . metoCLOPramide (REGLAN) 5 MG tablet Take 1 tablet (5 mg total) by mouth 3 (three) times daily before meals.  . pantoprazole (PROTONIX) 40 MG tablet Take 1 tablet (40 mg total) by mouth daily.  . vitamin B-12 (CYANOCOBALAMIN) 500 MCG tablet Take 500 mcg by mouth daily.    PHQ 2/9 Scores 01/21/2019 10/23/2018 03/05/2018 06/02/2016  PHQ - 2 Score 0 0 2 0  PHQ- 9 Score - - 6 -    BP Readings from Last 3 Encounters:  03/04/19 128/82  02/06/19 (!) 153/76  02/04/19 (!) 136/55    Physical Exam Vitals signs and nursing note reviewed.  Constitutional:      General: She is not in acute distress.    Appearance: Normal appearance. She is well-developed.  HENT:     Head: Normocephalic and atraumatic.     Right Ear: Tympanic membrane and ear canal normal.     Left Ear: Tympanic membrane and ear canal normal.  Neck:     Musculoskeletal: Normal range of motion.  Cardiovascular:     Rate and Rhythm: Normal rate and regular rhythm.     Pulses: Normal pulses.  Pulmonary:     Effort: Pulmonary effort is normal. No respiratory distress.     Breath sounds: No wheezing or rhonchi.  Abdominal:     General: There is no distension.     Palpations: Abdomen is soft. There is  no mass.     Tenderness: There is no abdominal tenderness. There is no guarding.  Musculoskeletal: Normal range of motion.  Lymphadenopathy:     Cervical: No cervical adenopathy.  Skin:    General: Skin is warm and dry.     Findings: No rash.  Neurological:     Mental Status: She is alert and oriented to person, place, and time.  Psychiatric:        Behavior: Behavior normal.        Thought Content: Thought content normal.     Wt Readings from Last 3 Encounters:  03/04/19 250 lb (113.4 kg)  02/05/19 242 lb 12.8 oz (110.1 kg)  02/04/19 246 lb 14.6 oz (112 kg)    BP 128/82   Pulse 64   Resp 16   Ht 5\' 4"  (1.626 m)   Wt 250 lb (113.4 kg)   SpO2 96%   BMI 42.91 kg/m   Assessment and Plan: 1. Benign essential HTN Clinically doing well with normal BP readings No chest pain, SOB or side effects noted Continue Hydralazine three times per day  2. Diverticulosis of colon Symptoms now resolved after course of Augmentin No worrisome findings of bleeding, weight loss, change in  appetite Will monitor for recurrence  3. Dizziness Resolved with full negative work up - test results and implications reviewed with pt and family Pt and family reassured; continue aspirin daily No ear canal obstruction to contribute (pt felt that she had wax impaction)  4. Need for shingles vaccine To be given at local pharmacy - Zoster Vaccine Adjuvanted [facility]) injection; Inject 0.5 mLs into the muscle once for 1 dose.  Dispense: 0.5 mL; Refill: 1   Partially dictated using Editor, commissioning. Any errors are unintentional.  Halina Maidens, MD [facility]  03/04/2019

## 2019-04-16 ENCOUNTER — Other Ambulatory Visit: Payer: Self-pay | Admitting: Internal Medicine

## 2019-04-16 DIAGNOSIS — Z1231 Encounter for screening mammogram for malignant neoplasm of breast: Secondary | ICD-10-CM

## 2019-05-20 ENCOUNTER — Other Ambulatory Visit: Payer: Self-pay

## 2019-05-20 ENCOUNTER — Ambulatory Visit (INDEPENDENT_AMBULATORY_CARE_PROVIDER_SITE_OTHER): Payer: Medicare Other

## 2019-05-20 VITALS — BP 142/78 | HR 63 | Temp 98.3°F | Resp 16 | Ht 64.0 in | Wt 245.0 lb

## 2019-05-20 DIAGNOSIS — Z0000 Encounter for general adult medical examination without abnormal findings: Secondary | ICD-10-CM | POA: Diagnosis not present

## 2019-05-20 NOTE — Progress Notes (Signed)
Subjective:   Taylor Johnson is a 73 y.o. female who presents for Medicare Annual (Subsequent) preventive examination.  Review of Systems:   Cardiac Risk Factors include: advanced age (>89men, >49 women);hypertension;sedentary lifestyle;obesity (BMI >30kg/m2)     Objective:     Vitals: BP (!) 142/78 (BP Location: Left Arm, Patient Position: Sitting, Cuff Size: Large)   Pulse 63   Temp 98.3 F (36.8 C) (Oral)   Resp 16   Ht 5\' 4"  (1.626 m)   Wt 245 lb (111.1 kg)   SpO2 93%   BMI 42.05 kg/m   Body mass index is 42.05 kg/m.  Advanced Directives 05/20/2019 02/05/2019 02/05/2019 02/04/2019 01/14/2019 01/13/2019 10/15/2018  Does Patient Have a Medical Advance Directive? No No No No No No No  Would patient like information on creating a medical advance directive? Yes (MAU/Ambulatory/Procedural Areas - Information given) Yes (Inpatient - patient defers creating a medical advance directive at this time - Information given) Yes (Inpatient - patient requests chaplain consult to create a medical advance directive) - No - Patient declined - No - Patient declined    Tobacco Social History   Tobacco Use  Smoking Status Never Smoker  Smokeless Tobacco Never Used  Tobacco Comment   smoking cessation materials not required     Counseling given: Not Answered Comment: smoking cessation materials not required   Clinical Intake:  Pre-visit preparation completed: Yes  Pain : No/denies pain     Nutritional Status: BMI > 30  Obese Nutritional Risks: None Diabetes: No  How often do you need to have someone help you when you read instructions, pamphlets, or other written materials from your doctor or pharmacy?: 1 - Never  Interpreter Needed?: No  Information entered by :: Clemetine Marker LPN  Past Medical History:  Diagnosis Date  . Abdominal pain 01/27/2017  . Anxiety   . Anxiety, generalized 05/11/2015  . Asthma   . Cough 06/02/2016   Chronic - followed by Pulmonary  . Depression    . Developmental delay   . Diverticulitis   . Diverticulosis of colon 05/11/2015  . Dysphagia 10/15/2015   Routine Ba Swallow normal; rec modified barium swallow   . Hypoxia 09/12/2015  . Leg swelling   . Localized edema 04/15/2016  . Microscopic hematuria 04/15/2016  . Nausea and vomiting 01/29/2017  . Orthostatic hypotension 03/16/2017  . OSA on CPAP 11/18/2015   CPAP @ 18 cm H2O started 02/2016  . Rectal bleeding 10/15/2018  . Urge incontinence of urine 05/12/2015   Past Surgical History:  Procedure Laterality Date  . ABDOMINAL HYSTERECTOMY    . BREAST BIOPSY Left    neg  . COLON SURGERY    . ESOPHAGOGASTRODUODENOSCOPY (EGD) WITH PROPOFOL N/A 01/29/2017   Procedure: ESOPHAGOGASTRODUODENOSCOPY (EGD) WITH PROPOFOL;  Surgeon: Wilford Corner, MD;  Location: [facility] ENDOSCOPY;  Service: Endoscopy;  Laterality: N/A;   Family History  Problem Relation Age of Onset  . Hypertension Mother   . Kidney disease Mother   . Dementia Mother   . Cancer Father        lung  . Bladder Cancer Neg Hx   . Breast cancer Neg Hx    Social History   Socioeconomic History  . Marital status: Single    Spouse name: Not on file  . Number of children: 0  . Years of education: Not on file  . 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 Food insecurity    Worry: Never true    Inability: Never true  . Transportation needs    Medical: No    Non-medical: No  Tobacco Use  . Smoking status: Never Smoker  . Smokeless tobacco: Never Used  . Tobacco comment: smoking cessation materials not required  Substance and Sexual Activity  . Alcohol use: No    Alcohol/week: 0.0 standard drinks  . Drug use: No  . Sexual activity: Never    Birth control/protection: Post-menopausal  Lifestyle  . Physical activity    Days per week: 0 days    Minutes per session: 0 min  . Stress: To some extent  Relationships  . Social  Herbalist on phone: Patient refused    Gets together: Patient refused    Attends religious service: Patient refused    Active member of club or organization: Patient refused    Attends meetings of clubs or organizations: Patient refused    Relationship status: Never married  Other Topics Concern  . Not on file  Social History Narrative  . Not on file    Outpatient Encounter Medications as of 05/20/2019  Medication Sig  . aspirin 81 MG EC tablet Take 1 tablet (81 mg total) by mouth daily.  . hydrALAZINE (APRESOLINE) 25 MG tablet Take 1 tablet (25 mg total) by mouth 3 (three) times daily.  . vitamin B-12 (CYANOCOBALAMIN) 500 MCG tablet Take 500 mcg by mouth daily.  . meclizine (ANTIVERT) 12.5 MG tablet Take 1 tablet (12.5 mg total) by mouth 3 (three) times daily as needed for dizziness. (Patient not taking: Reported on 05/20/2019)  . metoCLOPramide (REGLAN) 5 MG tablet Take 1 tablet (5 mg total) by mouth 3 (three) times daily before meals.  . [DISCONTINUED] amoxicillin-clavulanate (AUGMENTIN) 875-125 MG tablet Take 1 tablet by mouth every 12 (twelve) hours.   No facility-administered encounter medications on file as of 05/20/2019.     Activities of Daily Living In your present state of health, do you have any difficulty performing the following activities: 05/20/2019 02/05/2019  Hearing? N N  Vision? N N  Difficulty concentrating or making decisions? Y N  Walking or climbing stairs? Y Y  Dressing or bathing? N N  Doing errands, shopping? Y Y  Comment does not drive Facilities manager and eating ? N -  Using the Toilet? N -  In the past six months, have you accidently leaked urine? Y -  Comment wears depends -  Do you have problems with loss of bowel control? N -  Managing your Medications? Y -  Managing your Finances? Y -  Housekeeping or managing your Housekeeping? N -  Some recent data might be hidden    Patient Care Team: [name], MD as PCP - General  (Internal Medicine) Erby Pian, MD as Consulting Physician (Pulmonary Disease)    Assessment:   This is a routine wellness examination for [name].  Exercise Activities and Dietary recommendations Current Exercise Habits: The patient does not participate in regular exercise at present, Exercise limited by: orthopedic condition(s)  Goals    . Prevent falls     Recommend to remove any items from the home that may cause slips or trips.       Fall Risk Fall Risk  05/20/2019 03/04/2019 01/21/2019 10/23/2018 03/05/2018  Falls in the past year? 0 0 0 0 No  Number falls in past yr: 0 0 0 0 -  Injury with Fall? 0 0 0 0 -  Risk for fall due to : History of fall(s);Impaired balance/gait - - - Impaired balance/gait  Follow up Falls prevention discussed - Falls evaluation completed Falls evaluation completed -   FALL RISK PREVENTION PERTAINING TO THE HOME:  Any stairs in or around the home? Yes  If so, do they handrails? Yes   Home free of loose throw rugs in walkways, pet beds, electrical cords, etc? Yes  Adequate lighting in your home to reduce risk of falls? Yes   ASSISTIVE DEVICES UTILIZED TO PREVENT FALLS:  Life alert? No  Use of a cane, walker or w/c? Yes  Grab bars in the bathroom? Yes  Shower chair or bench in shower? Yes  Elevated toilet seat or a handicapped toilet? No   DME ORDERS:  DME order needed?  No   TIMED UP AND GO:  Was the test performed? Yes .  Length of time to ambulate 10 feet: 7 sec.   GAIT:  Appearance of gait: Gait slow, steady and with the use of an assistive device.   Education: Fall risk prevention has been discussed.  Intervention(s) required? No    Depression Screen PHQ 2/9 Scores 05/20/2019 01/21/2019 10/23/2018 03/05/2018  PHQ - 2 Score 0 0 0 2  PHQ- 9 Score - - - 6     Cognitive Function - pt did not attempt majority of this test     6CIT Screen 05/20/2019 03/05/2018 06/02/2016  What Year? 4 points 4 points 4 points  What month? 0 points 0  points 0 points  What time? 0 points 0 points 0 points  Count back from 20 4 points 2 points 4 points  Months in reverse 4 points 4 points 4 points  Repeat phrase 10 points 6 points 4 points  Total Score 22 16 16     Immunization History  Administered Date(s) Administered  . Influenza, High Dose Seasonal PF 04/13/2018  . Influenza,inj,Quad PF,6+ Mos 05/12/2015, 04/05/2016, 03/16/2017  . Influenza-Unspecified 05/10/2019  . Pneumococcal Conjugate-13 04/05/2016  . Pneumococcal Polysaccharide-23 01/06/2014, 09/13/2015  . Zoster Recombinat (Shingrix) 05/10/2019    Qualifies for Shingles Vaccine? Yes   Due for second Shingrix.   Tdap: Although this vaccine is not a covered service during a Wellness Exam, does the patient still wish to receive this vaccine today?  No .  Education has been provided regarding the importance of this vaccine. Advised may receive this vaccine at local pharmacy or Health Dept. Aware to provide a copy of the vaccination record if obtained from local pharmacy or Health Dept. Verbalized acceptance and understanding.  Flu Vaccine: Up to date  Pneumococcal Vaccine: Up to date   Screening Tests Health Maintenance  Topic Date Due  . TETANUS/TDAP  12/02/1965  . MAMMOGRAM  05/08/2019  . COLONOSCOPY  04/21/2024  . INFLUENZA VACCINE  Completed  . DEXA SCAN  Completed  . Hepatitis C Screening  Completed  . PNA vac Low Risk Adult  Completed    Cancer Screenings:  Colorectal Screening: Completed 04/21/14. Repeat every 10 years;   Mammogram: scheduled for 06/03/19  Bone Density: Completed 05/07/18. Results reflect  OSTEOPENIA. Repeat every 2 years.   Lung Cancer Screening: (Low Dose CT Chest recommended if Age 5-80 years, 30 pack-year currently smoking OR have quit w/in 15years.) does not qualify.   Additional Screening:  Hepatitis C Screening: does qualify; Completed 06/02/16  Vision Screening: Recommended annual ophthalmology exams for early detection of  glaucoma and other disorders  of the eye. Is the patient up to date with their annual eye exam?  No  - postponed due to Covid Who is the provider or what is the name of the office in which the pt attends annual eye exams? [facility] Screening: Recommended annual dental exams for proper oral hygiene  Community Resource Referral:  CRR required this visit?  No      Plan:     I have personally reviewed and addressed the Medicare Annual Wellness questionnaire and have noted the following in the patient's chart:  A. Medical and social history B. Use of alcohol, tobacco or illicit drugs  C. Current medications and supplements D. Functional ability and status E.  Nutritional status F.  Physical activity G. Advance directives H. List of other physicians I.  Hospitalizations, surgeries, and ER visits in previous 12 months J.  [place] such as hearing and vision if needed, cognitive and depression L. Referrals and appointments   In addition, I have reviewed and discussed with patient certain preventive protocols, quality metrics, and best practice recommendations. A written personalized care plan for preventive services as well as general preventive health recommendations were provided to patient.   Signed,  Clemetine Marker, LPN Nurse Health Advisor   Nurse Notes: none

## 2019-05-20 NOTE — Patient Instructions (Signed)
Taylor Johnson ,/ Thank you for taking time to come for your Medicare Wellness Visit. I appreciate your ongoing commitment to your health goals. Please review the following plan we discussed and let me know if I can assist you in the future.   Screening recommendations/referrals: Colonoscopy: done 04/21/14 Mammogram: scheduled for 06/03/19 Bone Density: done 05/07/18 Recommended yearly ophthalmology/optometry visit for glaucoma screening and checkup Recommended yearly dental visit for hygiene and checkup  Vaccinations: Influenza vaccine: done 05/10/19 Pneumococcal vaccine: done 04/05/16 Tdap vaccine: due - please contact us if you get a cut or scrape on rust or metal Shingles vaccine: due for second dose of Shingrix after 07/10/19    Advanced directives: Advance directive discussed with you today. I have provided a copy for you to complete at home and have notarized. Once this is complete please bring a copy in to our office so we can scan it into your chart.  Conditions/risks identified: Recommend continuing to prevent falls  Next appointment: Please follow up in one year for your Medicare Annual Wellness visit.     Preventive Care 48 Years and Older, Female Preventive care refers to lifestyle choices and visits with your health care provider that can promote health and wellness. What does preventive care include?  A yearly physical exam. This is also called an annual well check.  Dental exams once or twice a year.  Routine eye exams. Ask your health care provider how often you should have your eyes checked.  Personal lifestyle choices, including:  Daily care of your teeth and gums.  Regular physical activity.  Eating a healthy diet.  Avoiding tobacco and drug use.  Limiting alcohol use.  Practicing safe sex.  Taking low-dose aspirin every day.  Taking vitamin and mineral supplements as recommended by your health care provider. What happens during an annual well check?  The services and screenings done by your health care provider during your annual well check will depend on your age, overall health, lifestyle risk factors, and family history of disease. Counseling  Your health care provider may ask you questions about your:  Alcohol use.  Tobacco use.  Drug use.  Emotional well-being.  Home and relationship well-being.  Sexual activity.  Eating habits.  History of falls.  Memory and ability to understand (cognition).  Work and work Statistician.  Reproductive health. Screening  You may have the following tests or measurements:  Height, weight, and BMI.  Blood pressure.  Lipid and cholesterol levels. These may be checked every 5 years, or more frequently if you are over 34 years old.  Skin check.  Lung cancer screening. You may have this screening every year starting at age 84 if you have a 30-pack-year history of smoking and currently smoke or have quit within the past 15 years.  Fecal occult blood test (FOBT) of the stool. You may have this test every year starting at age 51.  Flexible sigmoidoscopy or colonoscopy. You may have a sigmoidoscopy every 5 years or a colonoscopy every 10 years starting at age 66.  Hepatitis C blood test.  Hepatitis B blood test.  Sexually transmitted disease (STD) testing.  Diabetes screening. This is done by checking your blood sugar (glucose) after you have not eaten for a while (fasting). You may have this done every 1-3 years.  Bone density scan. This is done to screen for osteoporosis. You may have this done starting at age 19.  Mammogram. This may be done every 1-2 years. Talk to your health care  provider about how often you should have regular mammograms. Talk with your health care provider about your test results, treatment options, and if necessary, the need for more tests. Vaccines  Your health care provider may recommend certain vaccines, such as:  Influenza vaccine. This is  recommended every year.  Tetanus, diphtheria, and acellular pertussis (Tdap, Td) vaccine. You may need a Td booster every 10 years.  Zoster vaccine. You may need this after age 49.  Pneumococcal 13-valent conjugate (PCV13) vaccine. One dose is recommended after age 42.  Pneumococcal polysaccharide (PPSV23) vaccine. One dose is recommended after age 76. Talk to your health care provider about which screenings and vaccines you need and how often you need them. This information is not intended to replace advice given to you by your health care provider. Make sure you discuss any questions you have with your health care provider. Document Released: 07/31/2015 Document Revised: 03/23/2016 Document Reviewed: 05/05/2015 Elsevier Interactive Patient Education  2017 [place] Prevention in the Home Falls can cause injuries. They can happen to people of all ages. There are many things you can do to make your home safe and to help prevent falls. What can I do on the outside of my home?  Regularly fix the edges of walkways and driveways and fix any cracks.  Remove anything that might make you trip as you walk through a door, such as a raised step or threshold.  Trim any bushes or trees on the path to your home.  Use bright outdoor lighting.  Clear any walking paths of anything that might make someone trip, such as rocks or tools.  Regularly check to see if handrails are loose or broken. Make sure that both sides of any steps have handrails.  Any raised decks and porches should have guardrails on the edges.  Have any leaves, snow, or ice cleared regularly.  Use sand or salt on walking paths during winter.  Clean up any spills in your garage right away. This includes oil or grease spills. What can I do in the bathroom?  Use night lights.  Install grab bars by the toilet and in the tub and shower. Do not use towel bars as grab bars.  Use non-skid mats or decals in the tub or  shower.  If you need to sit down in the shower, use a plastic, non-slip stool.  Keep the floor dry. Clean up any water that spills on the floor as soon as it happens.  Remove soap buildup in the tub or shower regularly.  Attach bath mats securely with double-sided non-slip rug tape.  Do not have throw rugs and other things on the floor that can make you trip. What can I do in the bedroom?  Use night lights.  Make sure that you have a light by your bed that is easy to reach.  Do not use any sheets or blankets that are too big for your bed. They should not hang down onto the floor.  Have a firm chair that has side arms. You can use this for support while you get dressed.  Do not have throw rugs and other things on the floor that can make you trip. What can I do in the kitchen?  Clean up any spills right away.  Avoid walking on wet floors.  Keep items that you use a lot in easy-to-reach places.  If you need to reach something above you, use a strong step stool that has a grab bar.  Keep electrical cords out of the way.  Do not use floor polish or wax that makes floors slippery. If you must use wax, use non-skid floor wax.  Do not have throw rugs and other things on the floor that can make you trip. What can I do with my stairs?  Do not leave any items on the stairs.  Make sure that there are handrails on both sides of the stairs and use them. Fix handrails that are broken or loose. Make sure that handrails are as long as the stairways.  Check any carpeting to make sure that it is firmly attached to the stairs. Fix any carpet that is loose or worn.  Avoid having throw rugs at the top or bottom of the stairs. If you do have throw rugs, attach them to the floor with carpet tape.  Make sure that you have a light switch at the top of the stairs and the bottom of the stairs. If you do not have them, ask someone to add them for you. What else can I do to help prevent falls?   Wear shoes that:  Do not have high heels.  Have rubber bottoms.  Are comfortable and fit you well.  Are closed at the toe. Do not wear sandals.  If you use a stepladder:  Make sure that it is fully opened. Do not climb a closed stepladder.  Make sure that both sides of the stepladder are locked into place.  Ask someone to hold it for you, if possible.  Clearly mark and make sure that you can see:  Any grab bars or handrails.  First and last steps.  Where the edge of each step is.  Use tools that help you move around (mobility aids) if they are needed. These include:  Canes.  Walkers.  Scooters.  Crutches.  Turn on the lights when you go into a dark area. Replace any light bulbs as soon as they burn out.  Set up your furniture so you have a clear path. Avoid moving your furniture around.  If any of your floors are uneven, fix them.  If there are any pets around you, be aware of where they are.  Review your medicines with your doctor. Some medicines can make you feel dizzy. This can increase your chance of falling. Ask your doctor what other things that you can do to help prevent falls. This information is not intended to replace advice given to you by your health care provider. Make sure you discuss any questions you have with your health care provider. Document Released: 04/30/2009 Document Revised: 12/10/2015 Document Reviewed: 08/08/2014 Elsevier Interactive Patient Education  2017 [employer].

## 2019-06-03 ENCOUNTER — Ambulatory Visit
Admission: RE | Admit: 2019-06-03 | Discharge: 2019-06-03 | Disposition: A | Discharge status: Home / Self Care | Payer: Medicare Other | Source: Ambulatory Visit | Attending: Internal Medicine | Admitting: Internal Medicine

## 2019-06-03 DIAGNOSIS — Z1231 Encounter for screening mammogram for malignant neoplasm of breast: Secondary | ICD-10-CM | POA: Diagnosis present

## 2019-06-24 ENCOUNTER — Ambulatory Visit: Payer: Medicare Other | Admitting: Internal Medicine

## 2019-07-25 ENCOUNTER — Emergency Department
Admission: EM | Admit: 2019-07-25 | Discharge: 2019-07-25 | Disposition: A | Discharge status: Home / Self Care | Payer: Medicare Other | Attending: Emergency Medicine | Admitting: Emergency Medicine

## 2019-07-25 ENCOUNTER — Other Ambulatory Visit: Payer: Self-pay | Admitting: Internal Medicine

## 2019-07-25 ENCOUNTER — Emergency Department: Payer: Medicare Other

## 2019-07-25 ENCOUNTER — Other Ambulatory Visit: Payer: Self-pay

## 2019-07-25 DIAGNOSIS — Z79899 Other long term (current) drug therapy: Secondary | ICD-10-CM | POA: Diagnosis not present

## 2019-07-25 DIAGNOSIS — J45909 Unspecified asthma, uncomplicated: Secondary | ICD-10-CM | POA: Insufficient documentation

## 2019-07-25 DIAGNOSIS — I10 Essential (primary) hypertension: Secondary | ICD-10-CM | POA: Insufficient documentation

## 2019-07-25 DIAGNOSIS — R109 Unspecified abdominal pain: Secondary | ICD-10-CM | POA: Diagnosis present

## 2019-07-25 DIAGNOSIS — K5790 Diverticulosis of intestine, part unspecified, without perforation or abscess without bleeding: Secondary | ICD-10-CM | POA: Diagnosis not present

## 2019-07-25 DIAGNOSIS — R1013 Epigastric pain: Secondary | ICD-10-CM

## 2019-07-25 DIAGNOSIS — Z7982 Long term (current) use of aspirin: Secondary | ICD-10-CM | POA: Insufficient documentation

## 2019-07-25 LAB — URINALYSIS, COMPLETE (UACMP) WITH MICROSCOPIC
Bilirubin Urine: NEGATIVE
Glucose, UA: NEGATIVE mg/dL
Hgb urine dipstick: NEGATIVE
Ketones, ur: NEGATIVE mg/dL
Leukocytes,Ua: NEGATIVE
Nitrite: NEGATIVE
Protein, ur: NEGATIVE mg/dL
Specific Gravity, Urine: 1.034 — ABNORMAL HIGH (ref 1.005–1.030)
Squamous Epithelial / HPF: NONE SEEN (ref 0–5)
pH: 6 (ref 5.0–8.0)

## 2019-07-25 LAB — CBC
HCT: 40.5 % (ref 36.0–46.0)
Hemoglobin: 12.1 g/dL (ref 12.0–15.0)
MCH: 26.8 pg (ref 26.0–34.0)
MCHC: 29.9 g/dL — ABNORMAL LOW (ref 30.0–36.0)
MCV: 89.6 fL (ref 80.0–100.0)
Platelets: 204 10*3/uL (ref 150–400)
RBC: 4.52 MIL/uL (ref 3.87–5.11)
RDW: 17 % — ABNORMAL HIGH (ref 11.5–15.5)
WBC: 5.9 10*3/uL (ref 4.0–10.5)
nRBC: 0 % (ref 0.0–0.2)

## 2019-07-25 LAB — COMPREHENSIVE METABOLIC PANEL
ALT: 12 U/L (ref 0–44)
AST: 15 U/L (ref 15–41)
Albumin: 4 g/dL (ref 3.5–5.0)
Alkaline Phosphatase: 95 U/L (ref 38–126)
Anion gap: 11 (ref 5–15)
BUN: 21 mg/dL (ref 8–23)
CO2: 28 mmol/L (ref 22–32)
Calcium: 9.3 mg/dL (ref 8.9–10.3)
Chloride: 103 mmol/L (ref 98–111)
Creatinine, Ser: 0.92 mg/dL (ref 0.44–1.00)
GFR calc Af Amer: >60 mL/min (ref >60)
GFR calc non Af Amer: >60 mL/min (ref >60)
Glucose, Bld: 101 mg/dL — ABNORMAL HIGH (ref 70–99)
Potassium: 3.7 mmol/L (ref 3.5–5.1)
Sodium: 142 mmol/L (ref 135–145)
Total Bilirubin: 0.5 mg/dL (ref 0.3–1.2)
Total Protein: 8.2 g/dL — ABNORMAL HIGH (ref 6.5–8.1)

## 2019-07-25 LAB — LIPASE, BLOOD: Lipase: 15 U/L (ref 11–51)

## 2019-07-25 MED ORDER — ALUM & MAG HYDROXIDE-SIMETH 200-200-20 MG/5ML PO SUSP
30.0000 mL | Freq: Once | ORAL | Status: AC
  Administered 2019-07-25: 30 mL via ORAL
  Filled 2019-07-25: qty 30

## 2019-07-25 MED ORDER — IOHEXOL 300 MG/ML  SOLN
100.0000 mL | Freq: Once | INTRAMUSCULAR | Status: AC | PRN
  Administered 2019-07-25: 100 mL via INTRAVENOUS
  Filled 2019-07-25: qty 100

## 2019-07-25 MED ORDER — PANTOPRAZOLE SODIUM 40 MG PO TBEC: 40.0000 mg | DELAYED_RELEASE_TABLET | Freq: Every day | ORAL | 0 refills | Status: DC

## 2019-07-25 MED ORDER — LIDOCAINE VISCOUS HCL 2 % MT SOLN
15.0000 mL | Freq: Once | OROMUCOSAL | Status: AC
  Administered 2019-07-25: 15 mL via ORAL
  Filled 2019-07-25: qty 15

## 2019-07-25 MED ORDER — PANTOPRAZOLE SODIUM 40 MG IV SOLR
40.0000 mg | Freq: Once | INTRAVENOUS | Status: AC
  Administered 2019-07-25: 40 mg via INTRAVENOUS
  Filled 2019-07-25: qty 40

## 2019-07-25 MED ORDER — METOCLOPRAMIDE HCL 10 MG PO TABS
10.0000 mg | ORAL_TABLET | Freq: Once | ORAL | Status: AC
  Administered 2019-07-25: 19:00:00 10 mg via ORAL
  Filled 2019-07-25: qty 1

## 2019-07-25 NOTE — ED Triage Notes (Signed)
Pt comes into the ED via EMS from home, states she ate some pork today and is allergic to it, states it causes GI sx. Denies any N/V/D.[name] denies any rash or other sx

## 2019-07-25 NOTE — Discharge Instructions (Addendum)
Your exam, labs, and CT scan are negative at this time. Take the prescription meds as directed. Follow-up with Dr. Annie Paras for onging symptoms. Return to the ED as needed.

## 2019-07-25 NOTE — ED Provider Notes (Signed)
[facility] Emergency Department Provider Note  ____________________________________________  Time seen: Approximately 2:54 PM  I have reviewed the triage vital signs and the nursing notes.   HISTORY  Chief Complaint Allergic Reaction and Abdominal Pain    HPI Taylor Johnson is a 74 y.o. female that presents to the emergency department for evaluation of abdominal pain yesterday and again this morning.  Patient states that she ate pork skins and bread pudding yesterday.  She developed abdominal pain shortly after.  She still had abdominal pain when she woke up this morning.  She states that she is not supposed to eat pork skins but she is "hard headed" and wanted them.  Patient states that she urinates frequently.  No fevers, nausea, vomiting, diarrhea, constipation.   Past Medical History:  Diagnosis Date  . Abdominal pain 01/27/2017  . Anxiety   . Anxiety, generalized 05/11/2015  . Asthma   . Cough 06/02/2016   Chronic - followed by Pulmonary  . Depression   . Developmental delay   . Diverticulitis   . Diverticulosis of colon 05/11/2015  . Dysphagia 10/15/2015   Routine Ba Swallow normal; rec modified barium swallow   . Hypoxia 09/12/2015  . Leg swelling   . Localized edema 04/15/2016  . Microscopic hematuria 04/15/2016  . Nausea and vomiting 01/29/2017  . Orthostatic hypotension 03/16/2017  . OSA on CPAP 11/18/2015   CPAP @ 18 cm H2O started 02/2016  . Rectal bleeding 10/15/2018  . Urge incontinence of urine 05/12/2015    Patient Active Problem List   Diagnosis Date Noted  . Dizziness 02/04/2019  . Benign essential HTN 10/23/2018  . Functional systolic murmur 0000000  . Developmental delay disorder 02/06/2017  . Calculus of gallbladder without cholecystitis without obstruction 01/20/2017  . Cough 06/02/2016  . Localized edema 04/15/2016  . Microscopic hematuria 04/15/2016  . OSA on CPAP 11/18/2015  . Dysphagia 10/15/2015  . Urge incontinence of  urine 05/12/2015  . Diverticulosis of colon 05/11/2015  . Anxiety, generalized 05/11/2015    Past Surgical History:  Procedure Laterality Date  . ABDOMINAL HYSTERECTOMY    . BREAST BIOPSY Left    neg  . COLON SURGERY    . ESOPHAGOGASTRODUODENOSCOPY (EGD) WITH PROPOFOL N/A 01/29/2017   Procedure: ESOPHAGOGASTRODUODENOSCOPY (EGD) WITH PROPOFOL;  Surgeon: Wilford Corner, MD;  [facility]: [facility] ENDOSCOPY;  Service: Endoscopy;  Laterality: N/A;    Prior to Admission medications   Medication Sig Start Date End Date Taking? Authorizing Provider  aspirin 81 MG EC tablet Take 1 tablet (81 mg total) by mouth daily. 02/21/19   [name], MD  hydrALAZINE (APRESOLINE) 25 MG tablet Take 1 tablet (25 mg total) by mouth 3 (three) times daily. 02/21/19   [name], MD  meclizine (ANTIVERT) 12.5 MG tablet TAKE ONE TABLET BY MOUTH THREE TIMES DAILY AS NEEDED FOR DIZZINESS 07/25/19   [name], MD  metoCLOPramide (REGLAN) 5 MG tablet Take 1 tablet (5 mg total) by mouth 3 (three) times daily before meals. 02/21/19 03/23/19  [name], MD  vitamin B-12 (CYANOCOBALAMIN) 500 MCG tablet Take 500 mcg by mouth daily.    [provider]    Allergies Patient has no known allergies.  Family History  Problem Relation Age of Onset  . Hypertension Mother   . Kidney disease Mother   . Dementia Mother   . Cancer Father        lung  . Bladder Cancer Neg Hx   . Breast  cancer Neg Hx     Social History Social History   Tobacco Use  . Smoking status: Never Smoker  . Smokeless tobacco: Never Used  . Tobacco comment: smoking cessation materials not required  Substance Use Topics  . Alcohol use: No    Alcohol/week: 0.0 standard drinks  . Drug use: No     Review of Systems  Constitutional: No fever/chills Respiratory: No SOB. Gastrointestinal: Positive for abdominal pain.  No nausea, no vomiting.  Genitourinary: Negative for dysuria. Musculoskeletal: Negative for  musculoskeletal pain. Skin: Negative for rash, abrasions, lacerations, ecchymosis.  ____________________________________________   PHYSICAL EXAM:  VITAL SIGNS: ED Triage Vitals  Enc Vitals Group     BP 07/25/19 1344 (!) 145/111     Pulse Rate 07/25/19 1344 75     Resp 07/25/19 1344 16     Temp 07/25/19 1344 98.5 F (36.9 C)     Temp Source 07/25/19 1344 Oral     SpO2 07/25/19 1344 96 %     Weight 07/25/19 1325 280 lb (127 kg)     Height 07/25/19 1325 5\' 7"  (1.702 m)     Head Circumference --      Peak Flow --      Pain Score 07/25/19 1324 5     Pain Loc --      Pain Edu? --      Excl. in [place]? --      Constitutional: Alert and oriented. Well appearing and in no acute distress. Eyes: Conjunctivae are normal. PERRL. EOMI. Head: Atraumatic. ENT:      Ears:      Nose: No congestion/rhinnorhea.      Mouth/Throat: Mucous membranes are moist.  Neck: No stridor. Cardiovascular: Normal rate, regular rhythm.  Good peripheral circulation. Respiratory: Normal respiratory effort without tachypnea or retractions. Lungs CTAB. Good air entry to the bases with no decreased or absent breath sounds. Gastrointestinal: Bowel sounds 4 quadrants. Mid central abdominal tenderness. No guarding or rigidity. No palpable masses. No distention.  Musculoskeletal: Full range of motion to all extremities. No gross deformities appreciated. Neurologic:  Normal speech and language. No gross focal neurologic deficits are appreciated.  Skin:  Skin is warm, dry and intact. No rash noted. Psychiatric: Mood and affect are normal. Speech and behavior are normal. Patient exhibits appropriate insight and judgement.   ____________________________________________   LABS (all labs ordered are listed, but only abnormal results are displayed)  Labs Reviewed  CBC - Abnormal; Notable for the following components:      Result Value   MCHC 29.9 (*)    RDW 17.0 (*)    All other components within normal limits   COMPREHENSIVE METABOLIC PANEL - Abnormal; Notable for the following components:   Glucose, Bld 101 (*)    Total Protein 8.2 (*)    All other components within normal limits  LIPASE, BLOOD  URINALYSIS, COMPLETE (UACMP) WITH MICROSCOPIC   ____________________________________________  EKG   ____________________________________________  RADIOLOGY   No results found.  ____________________________________________    PROCEDURES  Procedure(s) performed:    Procedures    Medications  alum & mag hydroxide-simeth (MAALOX/MYLANTA) 200-200-20 MG/5ML suspension 30 mL (30 mLs Oral Given 07/25/19 1450)    And  lidocaine (XYLOCAINE) 2 % viscous mouth solution 15 mL (15 mLs Oral Given 07/25/19 1450)  iohexol (OMNIPAQUE) 300 MG/ML solution 100 mL (100 mLs Intravenous Contrast Given 07/25/19 1535)     ____________________________________________   INITIAL IMPRESSION / ASSESSMENT AND PLAN / ED COURSE  Pertinent  labs & imaging results that were available during my care of the patient were reviewed by me and considered in my medical decision making (see chart for details).  Review of the [place] CSRS was performed in accordance of the [place] prior to dispensing any controlled drugs.   Patient presented to the emergency department for evaluation of abdominal pain. Labwork is largely unremarkable. Patient was given GI cocktail for symptoms. CT scan in progress. Anticipate discharge home following reassuring CT scan. Care was transferred to [facility] for CT results and disposition.   Taylor Johnson was evaluated in Emergency Department on 07/25/2019 for the symptoms described in the history of present illness. She was evaluated in the context of the global COVID-19 pandemic, which necessitated consideration that the patient might be at risk for infection with the SARS-CoV-2 virus that causes COVID-19. Institutional protocols and algorithms that pertain to the evaluation of patients at risk for  COVID-19 are in a state of rapid change based on information released by regulatory bodies including the CDC and federal and state organizations. These policies and algorithms were followed during the patient's care in the ED.   ____________________________________________  FINAL CLINICAL IMPRESSION(S) / ED DIAGNOSES    NEW MEDICATIONS STARTED DURING THIS VISIT:  ED Discharge Orders    None          This chart was dictated using voice recognition software/Dragon. Despite best efforts to proofread, errors can occur which can change the meaning. Any change was purely unintentional.    Laban Emperor, PA-C 07/25/19 1540    Earleen Newport, MD 07/25/19 [phone]

## 2019-07-25 NOTE — ED Notes (Signed)
Presents via EMS from home  Family states she developed some abd pain this am   Pt states she ate some sausage.pork skins and bread pudding  Then developed pain  Denies any n/v/d or fever

## 2019-07-25 NOTE — ED Provider Notes (Signed)
-----------------------------------------   3:55 PM on 07/25/2019 -----------------------------------------  Blood pressure (!) 145/111, pulse 75, temperature 98.5 F (36.9 C), temperature source Oral, resp. rate 16, height 5\' 7"  (1.702 m), weight 127 kg, SpO2 96 %.  Assuming care from Laban Emperor, PA-C.  In short, Taylor Johnson is a 74 y.o. female with a chief complaint of Allergic Reaction and Abdominal Pain .  Refer to the original H&P for additional details.  The current plan of care is to await CT abdomen/pelvis results, and disposition the patient pending results. ________________________________________________  RADIOLOGY  CT ABD/Pelvis w/ CM  IMPRESSION: No new or acute findings to account for reported symptoms. Stable chronic findings detailed above. __________________________________________________  PROCEDURES  Pantoprazole 40 mg IVP Reglan 10 mg PO ___________________________________________________  DISPOSITION  Geriatric patient with ED evaluation of acute generalized abdominal pain.  Patient with a history of diverticulitis was evaluated for her symptoms including a CT which revealed no acute findings.  No signs of diverticulitis was indicated.  Patient continued to endorse some generalized abdominal pain.  She was treated prior to discharge with Protonix as well as a dose of Reglan.  Symptoms are improved at this time and patient will be discharged to follow with primary provider.  A prescription for Protonix is provided.  Patient will continue the previously prescribed daily Reglan to dose as directed.  Return precautions have been reviewed.   Melvenia Needles, PA-C 07/25/19 [name]    Duffy Bruce, MD 07/26/19 [phone]

## 2019-07-30 ENCOUNTER — Telehealth: Payer: Self-pay | Admitting: Gastroenterology

## 2019-07-30 ENCOUNTER — Ambulatory Visit: Payer: Medicare Other | Admitting: Internal Medicine

## 2019-07-30 NOTE — Telephone Encounter (Signed)
Pt has been scheduled for Monday 08/05/2019, pt has been notified and verbalized understanding

## 2019-07-30 NOTE — Telephone Encounter (Signed)
Pt sister is calling pt is having severe Stomach pain and Diverticulitis  She was scheduled to see  Her PCP  but they told her she needed to see her GI doctor pt was seen recenlt at ER and was given mediation please call [name] at cb [phone] ASAP

## 2019-08-05 ENCOUNTER — Other Ambulatory Visit: Payer: Self-pay

## 2019-08-05 ENCOUNTER — Encounter: Payer: Self-pay | Admitting: Gastroenterology

## 2019-08-05 ENCOUNTER — Ambulatory Visit (INDEPENDENT_AMBULATORY_CARE_PROVIDER_SITE_OTHER): Payer: Medicare Other | Admitting: Gastroenterology

## 2019-08-05 VITALS — BP 168/90 | HR 58 | Temp 98.1°F | Resp 16 | Ht 64.0 in | Wt 250.8 lb

## 2019-08-05 DIAGNOSIS — R1030 Lower abdominal pain, unspecified: Secondary | ICD-10-CM | POA: Diagnosis not present

## 2019-08-05 NOTE — Patient Instructions (Signed)
1.  Take a bottle of magnesium citrate today 2.  Take MiraLAX 1 cup daily from tomorrow 3.  Trial of IBgard for abdominal pain 4.  Avoid red meat, sodas, fast foods, fried foods  Please call our office to speak with my nurse Barnie Alderman [phone] during business hours from 8am to 4pm if you have any questions/concerns. During after hours, you will be redirected to on call GI physician. For any emergency please call 911 or go the nearest emergency room.    [name], MD [address]  [place]  [place], [place] [address]  Main: [phone]  Fax: [phone]

## 2019-08-05 NOTE — Progress Notes (Signed)
[name], MD [address]  [place]  [place], [place] [address]  Main: [phone]  Fax: [phone]    Gastroenterology Consultation  Referring Provider:     [name], MD Primary Care Physician:  [name], MD Primary Gastroenterologist:  Dr. [name] Reason for Consultation: Abdominal pain        HPI:   DESIRAI BEBOUT is a 74 y.o. y/o female referred by Dr. Army Melia, Jesse Sans, MD  for consultation & management of Abdominal pain and difficulty swallowing. She has known history of severe pandiverticulosis and was recently admitted to [facility] about a month ago with recurrent nausea and vomiting, abdominal pain, CT angiogram of the abdomen did not reveal any bleeding source. There was no evidence of diverticulitis. She was seen by GI, underwent EGD and it was unremarkable. She had a normal barium swallow in March 2017. She was discharged to rehabilitation and subsequently home now for. Per her sister, she has lost about 30 pounds in last 2 months due to nausea, vomiting, abdominal pain and difficulty swallowing solid foods. She is also deconditioned since her discharge and is currently undergoing PT, patient thinks her energy levels are coming back up slowly. She is currently eating only mashed foods. Her sister reports that she has been coughing when she tries to eat solid food, and she feels like food is stuck in her throat. She denies upper abdominal pain, heartburn. She is not having episodes of nausea and vomiting anymore.  With regards to her abdominal pain, patient reports that it is worse at night. She is having one to 2 soft bowel movements daily. She has severe pandiverticulosis based on an endoscopy and abdominal imaging. Denies any blood in the stool. Per her sister, she had several episodes of diverticulitis in the past as well as diverticular bleeding needing blood transfusions.  Follow-up phone visit 12/03/2018 Patient  has been doing well.  Patient's sister provided the history.  Patient lives with her sister.  She does not have any concerns today.  Work-up of anemia revealed improving hemoglobin and normal B12, folate and iron panel.  H. pylori breath test returned positive.  She is taking omeprazole 40 mg once a day at present.  She denies any difficulty swallowing.  She denies any other GI symptoms at this time.  Follow-up phone visit 12/24/2018 Ms. Umstead had episode of rectal bleeding last week and her sister was very concerned about it.  She described it as bright red blood per rectum.  Repeat CBC revealed normal hemoglobin.  Patient's sister reported that rectal bleeding subsided and she does not have any abdominal symptoms.  Patient is about to finish triple therapy for H. pylori infection  Follow-up visit 08/05/2019 Ms. Keech went to [facility] ER on 07/25/2019 secondary to abdominal pain that started after eating pork meat fat.  She underwent CT abdomen and pelvis which was unremarkable except for colonic diverticulosis.  CBC, lipase and CMP were normal.  Patient's daughter reports that she eats all day long, does eat fried foods every day.  Patient reports having bowel movements every day.  She describes the pain below umbilicus without any radiation.  Patient denies rectal bleeding, nausea vomiting, abdominal bloating  GI Procedures: EGD 01/2017 normal. Colonoscopy 04/2014 showed diverticulosis. Complete report not available and patient reports that it was done at [facility]  Past Medical History:  Diagnosis Date  . Abdominal pain 01/27/2017  . Anxiety   .  Anxiety, generalized 05/11/2015  . Asthma   . Cough 06/02/2016   Chronic - followed by Pulmonary  . Depression   . Developmental delay   . Diverticulitis   . Diverticulosis of colon 05/11/2015  . Dysphagia 10/15/2015   Routine Ba Swallow normal; rec modified barium swallow   . Hypoxia 09/12/2015  . Leg swelling   . Localized edema 04/15/2016  .  Microscopic hematuria 04/15/2016  . Nausea and vomiting 01/29/2017  . Orthostatic hypotension 03/16/2017  . OSA on CPAP 11/18/2015   CPAP @ 18 cm H2O started 02/2016  . Rectal bleeding 10/15/2018  . Urge incontinence of urine 05/12/2015    Past Surgical History:  Procedure Laterality Date  . ABDOMINAL HYSTERECTOMY    . BREAST BIOPSY Left    neg  . COLON SURGERY    . ESOPHAGOGASTRODUODENOSCOPY (EGD) WITH PROPOFOL N/A 01/29/2017   Procedure: ESOPHAGOGASTRODUODENOSCOPY (EGD) WITH PROPOFOL;  Surgeon: Wilford Corner, MD;  Location: [facility] ENDOSCOPY;  Service: Endoscopy;  Laterality: N/A;    Current Outpatient Medications:  .  aspirin 81 MG EC tablet, Take 1 tablet (81 mg total) by mouth daily., Disp: 30 tablet, Rfl: 5 .  hydrALAZINE (APRESOLINE) 25 MG tablet, Take 1 tablet (25 mg total) by mouth 3 (three) times daily., Disp: 90 tablet, Rfl: 5 .  pantoprazole (PROTONIX) 40 MG tablet, Take 1 tablet (40 mg total) by mouth daily., Disp: 30 tablet, Rfl: 0 .  vitamin B-12 (CYANOCOBALAMIN) 500 MCG tablet, Take 500 mcg by mouth daily., Disp: , Rfl:  .  meclizine (ANTIVERT) 12.5 MG tablet, TAKE ONE TABLET BY MOUTH THREE TIMES DAILY AS NEEDED FOR DIZZINESS (Patient not taking: Reported on 08/05/2019), Disp: 30 tablet, Rfl: 5 .  metoCLOPramide (REGLAN) 5 MG tablet, Take 1 tablet (5 mg total) by mouth 3 (three) times daily before meals., Disp: 90 tablet, Rfl: 5   Family History  Problem Relation Age of Onset  . Hypertension Mother   . Kidney disease Mother   . Dementia Mother   . Cancer Father        lung  . Bladder Cancer Neg Hx   . Breast cancer Neg Hx      Social History   Tobacco Use  . Smoking status: Never Smoker  . Smokeless tobacco: Never Used  . Tobacco comment: smoking cessation materials not required  Substance Use Topics  . Alcohol use: No    Alcohol/week: 0.0 standard drinks  . Drug use: No    Allergies as of 08/05/2019  . (No Known Allergies)    Review of Systems:    All  systems reviewed and negative except where noted in HPI.   Physical Exam:  BP (!) 168/90 (BP Location: Left Arm, Patient Position: Sitting, Cuff Size: Large)   Pulse (!) 58   Temp 98.1 F (36.7 C)   Resp 16   Ht 5\' 4"  (1.626 m)   Wt 250 lb 12.8 oz (113.8 kg)   BMI 43.05 kg/m  No LMP recorded. Patient has had a hysterectomy.  General:   Alert,  Sitting in wheelchair, Well-developed, well-nourished, pleasant and cooperative in NAD Head:  Normocephalic and atraumatic. Eyes:  Sclera clear, no icterus.   Conjunctiva pink. Ears:  Normal auditory acuity. Lungs:  Respirations even and unlabored.  Clear throughout to auscultation.   No wheezes, crackles, or rhonchi. No acute distress. Heart:  Regular rate and rhythm; no murmurs, clicks, rubs, or gallops. Abdomen:  Normal bowel sounds. Obese Soft, non-tender and non-distended without masses, no  appreciable hepatosplenomegaly or hernias noted.  No guarding or rebound tenderness.   Rectal: Nor performed Msk:  Symmetrical without gross deformities. Good, equal movement & strength bilaterally. Pulses:  Normal pulses noted. Extremities:  No clubbing or edema.  No cyanosis. Neurologic:  Alert and oriented x3; Skin:  Intact without significant lesions or rashes. No jaundice. Psych:  Alert and cooperative. Normal mood and affect.  Imaging Studies: Reviewed  Assessment and Plan:   RENOTA ABBETT is a 74 y.o. y/o female with morbid obesity, abdominal pain most likely secondary to diverticulosis and she may have segmental colitis associated with diverticulosis causing abdominal pain.  Patient had a colonoscopy 2015 which revealed diverticulosis only.  CT abdomen pelvis was unremarkable.  I have discussed in length with her about modification in her diet which are likely contributing to her symptoms.  We will also treat as constipation  1.  Take a bottle of magnesium citrate today 2.  Take MiraLAX 1 cup daily from tomorrow 3.  Trial of IBgard for  abdominal pain 4.  Avoid red meat, sodas, fast foods, fried foods  Follow up as needed   [name], MD

## 2019-08-09 ENCOUNTER — Other Ambulatory Visit: Payer: Self-pay

## 2019-08-09 DIAGNOSIS — K2970 Gastritis, unspecified, without bleeding: Secondary | ICD-10-CM

## 2019-08-09 MED ORDER — METOCLOPRAMIDE HCL 5 MG PO TABS: 5.0000 mg | ORAL_TABLET | Freq: Three times a day (TID) | ORAL | 5 refills | Status: DC

## 2019-08-09 MED ORDER — ASPIRIN 81 MG PO TBEC: 81.0000 mg | DELAYED_RELEASE_TABLET | Freq: Every day | ORAL | 5 refills | Status: DC

## 2019-09-18 ENCOUNTER — Other Ambulatory Visit: Payer: Self-pay

## 2019-09-18 ENCOUNTER — Encounter: Payer: Self-pay | Admitting: Internal Medicine

## 2019-09-18 ENCOUNTER — Ambulatory Visit (INDEPENDENT_AMBULATORY_CARE_PROVIDER_SITE_OTHER): Payer: Medicare Other | Admitting: Internal Medicine

## 2019-09-18 VITALS — BP 140/86 | HR 73 | Temp 97.1°F | Ht 64.0 in | Wt 247.0 lb

## 2019-09-18 DIAGNOSIS — I10 Essential (primary) hypertension: Secondary | ICD-10-CM

## 2019-09-18 DIAGNOSIS — R410 Disorientation, unspecified: Secondary | ICD-10-CM | POA: Diagnosis not present

## 2019-09-18 DIAGNOSIS — R1310 Dysphagia, unspecified: Secondary | ICD-10-CM | POA: Diagnosis not present

## 2019-09-18 NOTE — Progress Notes (Signed)
Date:  09/18/2019   Name:  Taylor Johnson   DOB:  11-Nov-1945   MRN:  [id number]   Chief Complaint: Memory Loss (Patient is here with her sister Taylor Johnson today. ) Sister has noticed that Taylor Johnson will often stop and just stare but no twitching or jerking noted. Sister is concerned because Taylor Johnson is the main caretaker for their mother.  MRI Brain 01/2019: IMPRESSION: Normal for age MRI brain.  No acute abnormality.  Lab Results  Component Value Date   [id number] 01/30/2019    Hypertension This is a chronic problem. The problem is controlled. Pertinent negatives include no chest pain, headaches, palpitations or shortness of breath. Past treatments include direct vasodilators (hydralazine tid). The current treatment provides moderate improvement. There are no compliance problems.   Abdominal Pain This is a recurrent problem. The problem occurs intermittently. The problem has been unchanged. The pain is located in the epigastric region. Pertinent negatives include no headaches. She has tried proton pump inhibitors (and reglan) for the symptoms. Prior diagnostic workup includes CT scan and GI consult.    Lab Results  Component Value Date   CREATININE 0.92 07/25/2019   BUN 21 07/25/2019   NA 142 07/25/2019   K 3.7 07/25/2019   CL 103 07/25/2019   CO2 28 07/25/2019   Lab Results  Component Value Date   CHOL 163 02/05/2019   HDL 63 02/05/2019   LDLCALC 86 02/05/2019   TRIG 72 02/05/2019   CHOLHDL 2.6 02/05/2019   Lab Results  Component Value Date   TSH 1.960 01/13/2019   Lab Results  Component Value Date   HGBA1C 6.1 (H) 01/31/2017     Review of Systems  Constitutional: Negative for chills, fatigue and unexpected weight change.  HENT: Negative for trouble swallowing.   Eyes: Negative for visual disturbance.  Respiratory: Negative for cough, chest tightness and shortness of breath.   Cardiovascular: Negative for chest pain and palpitations.  Gastrointestinal: Positive for  abdominal pain (intermittent recurrent symptoms).  Neurological: Negative for dizziness, tremors, light-headedness, numbness and headaches.       No difficulty speaking  Psychiatric/Behavioral: Positive for agitation (some times gets agitated if someone tries to micromanage her). Negative for dysphoric mood and sleep disturbance.    Patient Active Problem List   Diagnosis Date Noted  . Dizziness 02/04/2019  . Benign essential HTN 10/23/2018  . Functional systolic murmur 0000000  . Developmental delay disorder 02/06/2017  . Calculus of gallbladder without cholecystitis without obstruction 01/20/2017  . Cough 06/02/2016  . Localized edema 04/15/2016  . Microscopic hematuria 04/15/2016  . OSA on CPAP 11/18/2015  . Dysphagia 10/15/2015  . Urge incontinence of urine 05/12/2015  . Diverticulosis of colon 05/11/2015  . Anxiety, generalized 05/11/2015    No Known Allergies  Past Surgical History:  Procedure Laterality Date  . ABDOMINAL HYSTERECTOMY    . BREAST BIOPSY Left    neg  . COLON SURGERY    . ESOPHAGOGASTRODUODENOSCOPY (EGD) WITH PROPOFOL N/A 01/29/2017   Procedure: ESOPHAGOGASTRODUODENOSCOPY (EGD) WITH PROPOFOL;  Surgeon: Wilford Corner, MD;  Location: [facility] ENDOSCOPY;  Service: Endoscopy;  Laterality: N/A;    Social History   Tobacco Use  . Smoking status: Never Smoker  . Smokeless tobacco: Never Used  . Tobacco comment: smoking cessation materials not required  Substance Use Topics  . Alcohol use: No    Alcohol/week: 0.0 standard drinks  . Drug use: No     Medication list has been reviewed and updated.  No outpatient medications have been marked as taking for the 09/18/19 encounter (Office Visit) with [name], MD.    [facility] 2/9 Scores 09/18/2019 05/20/2019 01/21/2019 10/23/2018  PHQ - 2 Score 2 0 0 0  PHQ- 9 Score 6 - - -    BP Readings from Last 3 Encounters:  09/18/19 140/86  08/05/19 (!) 168/90  07/25/19 (!) 155/87    Physical Exam  Constitutional:      Appearance: Normal appearance.  Cardiovascular:     Rate and Rhythm: Normal rate and regular rhythm.     Pulses: Normal pulses.     Heart sounds: No murmur.  Musculoskeletal:        General: No swelling.     Cervical back: Normal range of motion.     Right lower leg: No edema.     Left lower leg: No edema.  Lymphadenopathy:     Cervical: No cervical adenopathy.  Neurological:     General: No focal deficit present.     Mental Status: She is alert.     Sensory: Sensation is intact.     Motor: Motor function is intact.     Coordination: Coordination is intact.  Psychiatric:        Attention and Perception: Attention normal.        Mood and Affect: Mood normal.        Speech: Speech normal.     Wt Readings from Last 3 Encounters:  09/18/19 247 lb (112 kg)  08/05/19 250 lb 12.8 oz (113.8 kg)  07/25/19 280 lb (127 kg)    BP 140/86   Pulse 73   Temp (!) 97.1 F (36.2 C) (Temporal)   Ht 5\' 4"  (1.626 m)   Wt 247 lb (112 kg)   SpO2 95%   BMI 42.40 kg/m   Assessment and Plan: 1. Episodic confusion Described as very brief < 5 secs of "pauses" or staring then she resumes her activity She can not really describe these episodes but denies trembling, inability to speak during the spells or other focal complaints This just may her way of coping with mild memory impairment DDx includes temporal lobe epilepsy which would require Neurology consultation Recent MRI brain was normal for age B12 levels normal Family will continue to monitor, patient will let someone know if she does not feel well  2. Benign essential HTN Clinically stable exam with well controlled BP on hydralazine - sister will confirm correct dosing at home. Tolerating medications without side effects at this time. Pt to continue current regimen and low sodium diet; benefits of regular exercise as able discussed.  3. Dysphagia, unspecified type Previously on PPI and Reglan It is unclear if  she is still taking these - sister will check at home.   Partially dictated using Editor, commissioning. Any errors are unintentional.  Halina Maidens, MD [facility]  09/18/2019

## 2019-09-24 ENCOUNTER — Emergency Department: Payer: Medicare Other

## 2019-09-24 ENCOUNTER — Other Ambulatory Visit: Payer: Self-pay

## 2019-09-24 ENCOUNTER — Emergency Department
Admission: EM | Admit: 2019-09-24 | Discharge: 2019-09-24 | Disposition: A | Discharge status: Home / Self Care | Payer: Medicare Other | Attending: Student in an Organized Health Care Education/Training Program | Admitting: Student in an Organized Health Care Education/Training Program

## 2019-09-24 DIAGNOSIS — Z7982 Long term (current) use of aspirin: Secondary | ICD-10-CM | POA: Diagnosis not present

## 2019-09-24 DIAGNOSIS — R1084 Generalized abdominal pain: Secondary | ICD-10-CM | POA: Diagnosis not present

## 2019-09-24 DIAGNOSIS — K625 Hemorrhage of anus and rectum: Secondary | ICD-10-CM | POA: Diagnosis present

## 2019-09-24 DIAGNOSIS — I10 Essential (primary) hypertension: Secondary | ICD-10-CM | POA: Insufficient documentation

## 2019-09-24 DIAGNOSIS — J45909 Unspecified asthma, uncomplicated: Secondary | ICD-10-CM | POA: Diagnosis not present

## 2019-09-24 DIAGNOSIS — Z79899 Other long term (current) drug therapy: Secondary | ICD-10-CM | POA: Insufficient documentation

## 2019-09-24 LAB — COMPREHENSIVE METABOLIC PANEL
ALT: 11 U/L (ref 0–44)
AST: 14 U/L — ABNORMAL LOW (ref 15–41)
Albumin: 3.7 g/dL (ref 3.5–5.0)
Alkaline Phosphatase: 90 U/L (ref 38–126)
Anion gap: 9 (ref 5–15)
BUN: 18 mg/dL (ref 8–23)
CO2: 30 mmol/L (ref 22–32)
Calcium: 9.2 mg/dL (ref 8.9–10.3)
Chloride: 100 mmol/L (ref 98–111)
Creatinine, Ser: 1.05 mg/dL — ABNORMAL HIGH (ref 0.44–1.00)
GFR calc Af Amer: >60 mL/min (ref >60)
GFR calc non Af Amer: 53 mL/min — ABNORMAL LOW (ref >60)
Glucose, Bld: 103 mg/dL — ABNORMAL HIGH (ref 70–99)
Potassium: 3.7 mmol/L (ref 3.5–5.1)
Sodium: 139 mmol/L (ref 135–145)
Total Bilirubin: 0.6 mg/dL (ref 0.3–1.2)
Total Protein: 7.6 g/dL (ref 6.5–8.1)

## 2019-09-24 LAB — TYPE AND SCREEN
ABO/RH(D): O POS
Antibody Screen: NEGATIVE

## 2019-09-24 LAB — URINALYSIS, COMPLETE (UACMP) WITH MICROSCOPIC
Bacteria, UA: NONE SEEN
Bilirubin Urine: NEGATIVE
Glucose, UA: NEGATIVE mg/dL
Ketones, ur: NEGATIVE mg/dL
Nitrite: NEGATIVE
Protein, ur: NEGATIVE mg/dL
Specific Gravity, Urine: 1.034 — ABNORMAL HIGH (ref 1.005–1.030)
pH: 6 (ref 5.0–8.0)

## 2019-09-24 LAB — CBC
HCT: 39.8 % (ref 36.0–46.0)
Hemoglobin: 12.1 g/dL (ref 12.0–15.0)
MCH: 27.5 pg (ref 26.0–34.0)
MCHC: 30.4 g/dL (ref 30.0–36.0)
MCV: 90.5 fL (ref 80.0–100.0)
Platelets: 220 10*3/uL (ref 150–400)
RBC: 4.4 MIL/uL (ref 3.87–5.11)
RDW: 16.1 % — ABNORMAL HIGH (ref 11.5–15.5)
WBC: 5.3 10*3/uL (ref 4.0–10.5)
nRBC: 0 % (ref 0.0–0.2)

## 2019-09-24 MED ORDER — IOHEXOL 300 MG/ML  SOLN
125.0000 mL | Freq: Once | INTRAMUSCULAR | Status: AC | PRN
  Administered 2019-09-24: 125 mL via INTRAVENOUS

## 2019-09-24 NOTE — ED Notes (Signed)
This tech assisted pt to the restroom.

## 2019-09-24 NOTE — Discharge Instructions (Signed)

## 2019-09-24 NOTE — ED Triage Notes (Signed)
Pt c/o generalized abd pain with blood in stools since this morning. Pt has a hx of diverticulitis. Pt is in NAD at present

## 2019-09-24 NOTE — ED Notes (Signed)
Pt denies having legal guardian. Pt's sister [name]) states that she is pt's caregiver d/t developmental delay but they have not gotten any legal paperwork completed. Note from Eartha Inch, RN in 02/04/19 mentions same. LG notice removed

## 2019-09-24 NOTE — ED Provider Notes (Signed)
[facility] Emergency Department Provider Note    First MD Initiated Contact with Patient 09/24/19 1351     (approximate)  I have reviewed the triage vital signs and the nursing notes.   HISTORY  Chief Complaint GI Bleeding    HPI Taylor Johnson is a 74 y.o. female the extensive past medical history as listed below presents to the ER for 1 episode of bright red blood per rectum this morning. States she is also having some left-sided abdominal pain.  Does have a history of diverticulitis diverticulosis.  Not currently on any blood thinners denies any recent fevers.  Did not notice any melena. States she just noted blood on tissue paper after wiping.  Has been compliant with her medications.   Past Medical History:  Diagnosis Date  . Abdominal pain 01/27/2017  . Anxiety   . Anxiety, generalized 05/11/2015  . Asthma   . Cough 06/02/2016   Chronic - followed by Pulmonary  . Depression   . Developmental delay   . Diverticulitis   . Diverticulosis of colon 05/11/2015  . Dysphagia 10/15/2015   Routine Ba Swallow normal; rec modified barium swallow   . Hypoxia 09/12/2015  . Leg swelling   . Localized edema 04/15/2016  . Microscopic hematuria 04/15/2016  . Nausea and vomiting 01/29/2017  . Orthostatic hypotension 03/16/2017  . OSA on CPAP 11/18/2015   CPAP @ 18 cm H2O started 02/2016  . Rectal bleeding 10/15/2018  . Urge incontinence of urine 05/12/2015   Family History  Problem Relation Age of Onset  . Hypertension Mother   . Kidney disease Mother   . Dementia Mother   . Cancer Father        lung  . Bladder Cancer Neg Hx   . Breast cancer Neg Hx    Past Surgical History:  Procedure Laterality Date  . ABDOMINAL HYSTERECTOMY    . BREAST BIOPSY Left    neg  . COLON SURGERY    . ESOPHAGOGASTRODUODENOSCOPY (EGD) WITH PROPOFOL N/A 01/29/2017   Procedure: ESOPHAGOGASTRODUODENOSCOPY (EGD) WITH PROPOFOL;  Surgeon: Wilford Corner, MD;  Location: [facility]  ENDOSCOPY;  Service: Endoscopy;  Laterality: N/A;   Patient Active Problem List   Diagnosis Date Noted  . Dizziness 02/04/2019  . Benign essential HTN 10/23/2018  . Functional systolic murmur 0000000  . Developmental delay disorder 02/06/2017  . Calculus of gallbladder without cholecystitis without obstruction 01/20/2017  . Cough 06/02/2016  . Localized edema 04/15/2016  . Microscopic hematuria 04/15/2016  . OSA on CPAP 11/18/2015  . Dysphagia 10/15/2015  . Urge incontinence of urine 05/12/2015  . Diverticulosis of colon 05/11/2015  . Anxiety, generalized 05/11/2015      Prior to Admission medications   Medication Sig Start Date End Date Taking? Authorizing Provider  aspirin 81 MG EC tablet Take 1 tablet (81 mg total) by mouth daily. 08/09/19   [name], MD  hydrALAZINE (APRESOLINE) 25 MG tablet Take 1 tablet (25 mg total) by mouth 3 (three) times daily. 02/21/19   [name], MD  metoCLOPramide (REGLAN) 5 MG tablet Take 1 tablet (5 mg total) by mouth 3 (three) times daily before meals. 08/09/19 09/08/19  [name], MD  pantoprazole (PROTONIX) 40 MG tablet Take 1 tablet (40 mg total) by mouth daily. 07/25/19 08/24/19  Menshew, Dannielle Karvonen, PA-C  vitamin B-12 (CYANOCOBALAMIN) 500 MCG tablet Take 500 mcg by mouth daily.    [provider]    Allergies Patient has no  known allergies.    Social History Social History   Tobacco Use  . Smoking status: Never Smoker  . Smokeless tobacco: Never Used  . Tobacco comment: smoking cessation materials not required  Substance Use Topics  . Alcohol use: No    Alcohol/week: 0.0 standard drinks  . Drug use: No    Review of Systems Patient denies headaches, rhinorrhea, blurry vision, numbness, shortness of breath, chest pain, edema, cough, abdominal pain, nausea, vomiting, diarrhea, dysuria, fevers, rashes or hallucinations unless otherwise stated above in  HPI. ____________________________________________   PHYSICAL EXAM:  VITAL SIGNS: Vitals:   09/24/19 1632 09/24/19 1633  BP: (!) 197/92   Pulse: 64 67  Resp: 20   Temp:    SpO2: 96% 95%    Constitutional: Alert and oriented.  Eyes: Conjunctivae are normal.  Head: Atraumatic. Nose: No congestion/rhinnorhea. Mouth/Throat: Mucous membranes are moist.   Neck: No stridor. Painless ROM.  Cardiovascular: Normal rate, regular rhythm. Grossly normal heart sounds.  Good peripheral circulation. Respiratory: Normal respiratory effort.  No retractions. Lungs CTAB. Gastrointestinal: Soft and nontender. No distention. No abdominal bruits. No CVA tenderness. Genitourinary: Small nonthrombosed nonbleeding external hemorrhoid.  Stool is brown nonmelanotic nonbloody. No masses, stool is guaiac negative Musculoskeletal: No lower extremity tenderness nor edema.  No joint effusions. Neurologic:  Normal speech and language. No gross focal neurologic deficits are appreciated. No facial droop Skin:  Skin is warm, dry and intact. No rash noted. Psychiatric: Mood and affect are normal. Speech and behavior are normal.  ____________________________________________   LABS (all labs ordered are listed, but only abnormal results are displayed)  Results for orders placed or performed during the hospital encounter of 09/24/19 (from the past 24 hour(s))  Comprehensive metabolic panel     Status: Abnormal   Collection Time: 09/24/19 11:22 AM  Result Value Ref Range   Sodium 139 135 - 145 mmol/L   Potassium 3.7 3.5 - 5.1 mmol/L   Chloride 100 98 - 111 mmol/L   CO2 30 22 - 32 mmol/L   Glucose, Bld 103 (H) 70 - 99 mg/dL   BUN 18 8 - 23 mg/dL   Creatinine, Ser 1.05 (H) 0.44 - 1.00 mg/dL   Calcium 9.2 8.9 - 10.3 mg/dL   Total Protein 7.6 6.5 - 8.1 g/dL   Albumin 3.7 3.5 - 5.0 g/dL   AST 14 (L) 15 - 41 U/L   ALT 11 0 - 44 U/L   Alkaline Phosphatase 90 38 - 126 U/L   Total Bilirubin 0.6 0.3 - 1.2 mg/dL    GFR calc non Af Amer 53 (L) >60 mL/min   GFR calc Af Amer >60 >60 mL/min   Anion gap 9 5 - 15  CBC     Status: Abnormal   Collection Time: 09/24/19 11:22 AM  Result Value Ref Range   WBC 5.3 4.0 - 10.5 K/uL   RBC 4.40 3.87 - 5.11 MIL/uL   Hemoglobin 12.1 12.0 - 15.0 g/dL   HCT 39.8 36.0 - 46.0 %   MCV 90.5 80.0 - 100.0 fL   MCH 27.5 26.0 - 34.0 pg   MCHC 30.4 30.0 - 36.0 g/dL   RDW 16.1 (H) 11.5 - 15.5 %   Platelets 220 150 - 400 K/uL   nRBC 0.0 0.0 - 0.2 %  Type and screen [facility]     Status: None   Collection Time: 09/24/19 11:22 AM  Result Value Ref Range   ABO/RH(D) O POS  Antibody Screen NEG    Sample Expiration      09/27/2019,2359 Performed at [facility], [address] [place],  [id number]   Urinalysis, Complete w Microscopic     Status: Abnormal   Collection Time: 09/24/19  4:41 PM  Result Value Ref Range   Color, Urine STRAW (A) YELLOW   APPearance CLEAR (A) CLEAR   Specific Gravity, Urine 1.034 (H) 1.005 - 1.030   pH 6.0 5.0 - 8.0   Glucose, UA NEGATIVE NEGATIVE mg/dL   Hgb urine dipstick SMALL (A) NEGATIVE   Bilirubin Urine NEGATIVE NEGATIVE   Ketones, ur NEGATIVE NEGATIVE mg/dL   Protein, ur NEGATIVE NEGATIVE mg/dL   Nitrite NEGATIVE NEGATIVE   Leukocytes,Ua SMALL (A) NEGATIVE   RBC / HPF 6-10 0 - 5 RBC/hpf   WBC, UA 11-20 0 - 5 WBC/hpf   Bacteria, UA NONE SEEN NONE SEEN   Squamous Epithelial / LPF 0-5 0 - 5   ____________________________________________ ____________________________________________  RADIOLOGY  I personally reviewed all radiographic images ordered to evaluate for the above acute complaints and reviewed radiology reports and findings.  These findings were personally discussed with the patient.  Please see medical record for radiology report.  ____________________________________________   PROCEDURES  Procedure(s) performed:  Procedures    Critical Care performed:  no ____________________________________________   INITIAL IMPRESSION / ASSESSMENT AND PLAN / ED COURSE  Pertinent labs & imaging results that were available during my care of the patient were reviewed by me and considered in my medical decision making (see chart for details).   DDX: diverticulitis, hemorrhoid, ugib, lgib, hematuria, vaginal bleeding, fissure  Taylor Johnson is a 74 y.o. who presents to the ED with symptoms as described above.  Patient with extensive past medical history but blood work ordered to evaluate for by differential shows no evidence of acute blood loss anemia.  BUN is normal.  Has only had 1 episode and states that the stool was brown but noted some blood on tissue paper.  Denies any dysuria.  Does have some mild discomfort on abdominal exam.  Have a low suspicion for acute bleeding.  Possible diverticulitis.  The patient will be placed on continuous pulse oximetry and telemetry for monitoring.  Laboratory evaluation will be sent to evaluate for the above complaints.     Clinical Course as of Sep 24 1751  Tue Sep 24, 2019  1640 Hemoglobin stable.  She remains hemodynamically stable.  Rectal exam without any hemorrhoid evidence of fissure or mass.  Light brown stool on DRE which is guaiac negative.  Does not seem consistent with GI bleed.  No evidence of diverticulitis on exam.  Will check urine.   [PR]  1736 Urinalysis without evidence of bacteria or infection.  Patient tolerating PO.  Work-up here has been reassuring.  There is no evidence of bleeding and her stool was guaiac negative.  She has been hemodynamically stable and not on blood thinners. Discussed this with patient and family member.  At this point do believe that she will be appropriate for further outpatient follow-up and further work-up.  We discussed signs and symptoms for which she should return to the ER.  Have discussed with the patient and available family all diagnostics and treatments performed thus far  and all questions were answered to the best of my ability. The patient demonstrates understanding and agreement with plan.    [PR]    Clinical Course User Index [PR] Merlyn Lot, MD    The  patient was evaluated in Emergency Department today for the symptoms described in the history of present illness. He/she was evaluated in the context of the global COVID-19 pandemic, which necessitated consideration that the patient might be at risk for infection with the SARS-CoV-2 virus that causes COVID-19. Institutional protocols and algorithms that pertain to the evaluation of patients at risk for COVID-19 are in a state of rapid change based on information released by regulatory bodies including the CDC and federal and state organizations. These policies and algorithms were followed during the patient's care in the ED.  As part of my medical decision making, I reviewed the following data within the [place] notes reviewed and incorporated, Labs reviewed, notes from prior ED visits and [place] Controlled Substance Database   ____________________________________________   FINAL CLINICAL IMPRESSION(S) / ED DIAGNOSES  Final diagnoses:  Generalized abdominal pain      NEW MEDICATIONS STARTED DURING THIS VISIT:  New Prescriptions   No medications on file     Note:  This document was prepared using Dragon voice recognition software and may include unintentional dictation errors.    Merlyn Lot, MD 09/24/19 1753

## 2019-10-30 ENCOUNTER — Other Ambulatory Visit: Payer: Self-pay

## 2019-10-30 DIAGNOSIS — R030 Elevated blood-pressure reading, without diagnosis of hypertension: Secondary | ICD-10-CM

## 2019-10-30 MED ORDER — HYDRALAZINE HCL 25 MG PO TABS: 25.0000 mg | ORAL_TABLET | Freq: Three times a day (TID) | ORAL | 5 refills | Status: DC

## 2019-11-04 ENCOUNTER — Encounter: Payer: Self-pay | Admitting: Emergency Medicine

## 2019-11-04 ENCOUNTER — Other Ambulatory Visit: Payer: Self-pay

## 2019-11-04 ENCOUNTER — Emergency Department
Admission: EM | Admit: 2019-11-04 | Discharge: 2019-11-04 | Disposition: A | Discharge status: Home / Self Care | Payer: Medicare Other | Attending: Emergency Medicine | Admitting: Emergency Medicine

## 2019-11-04 ENCOUNTER — Emergency Department: Payer: Medicare Other

## 2019-11-04 DIAGNOSIS — K922 Gastrointestinal hemorrhage, unspecified: Secondary | ICD-10-CM | POA: Insufficient documentation

## 2019-11-04 DIAGNOSIS — I10 Essential (primary) hypertension: Secondary | ICD-10-CM | POA: Diagnosis not present

## 2019-11-04 DIAGNOSIS — Z7982 Long term (current) use of aspirin: Secondary | ICD-10-CM | POA: Diagnosis not present

## 2019-11-04 DIAGNOSIS — R42 Dizziness and giddiness: Secondary | ICD-10-CM | POA: Insufficient documentation

## 2019-11-04 DIAGNOSIS — R1030 Lower abdominal pain, unspecified: Secondary | ICD-10-CM | POA: Diagnosis present

## 2019-11-04 DIAGNOSIS — Z79899 Other long term (current) drug therapy: Secondary | ICD-10-CM | POA: Diagnosis not present

## 2019-11-04 LAB — CBC WITH DIFFERENTIAL/PLATELET
Abs Immature Granulocytes: 0.02 10*3/uL (ref 0.00–0.07)
Basophils Absolute: 0 10*3/uL (ref 0.0–0.1)
Basophils Relative: 1 %
Eosinophils Absolute: 0 10*3/uL (ref 0.0–0.5)
Eosinophils Relative: 1 %
HCT: 42.4 % (ref 36.0–46.0)
Hemoglobin: 13.1 g/dL (ref 12.0–15.0)
Immature Granulocytes: 0 %
Lymphocytes Relative: 32 %
Lymphs Abs: 1.8 10*3/uL (ref 0.7–4.0)
MCH: 27.6 pg (ref 26.0–34.0)
MCHC: 30.9 g/dL (ref 30.0–36.0)
MCV: 89.5 fL (ref 80.0–100.0)
Monocytes Absolute: 0.9 10*3/uL (ref 0.1–1.0)
Monocytes Relative: 15 %
Neutro Abs: 2.9 10*3/uL (ref 1.7–7.7)
Neutrophils Relative %: 51 %
Platelets: 211 10*3/uL (ref 150–400)
RBC: 4.74 MIL/uL (ref 3.87–5.11)
RDW: 16.6 % — ABNORMAL HIGH (ref 11.5–15.5)
WBC: 5.7 10*3/uL (ref 4.0–10.5)
nRBC: 0 % (ref 0.0–0.2)

## 2019-11-04 LAB — COMPREHENSIVE METABOLIC PANEL
ALT: 12 U/L (ref 0–44)
AST: 12 U/L — ABNORMAL LOW (ref 15–41)
Albumin: 4.2 g/dL (ref 3.5–5.0)
Alkaline Phosphatase: 90 U/L (ref 38–126)
Anion gap: 9 (ref 5–15)
BUN: 14 mg/dL (ref 8–23)
CO2: 31 mmol/L (ref 22–32)
Calcium: 9.4 mg/dL (ref 8.9–10.3)
Chloride: 101 mmol/L (ref 98–111)
Creatinine, Ser: 0.82 mg/dL (ref 0.44–1.00)
GFR calc Af Amer: >60 mL/min (ref >60)
GFR calc non Af Amer: >60 mL/min (ref >60)
Glucose, Bld: 103 mg/dL — ABNORMAL HIGH (ref 70–99)
Potassium: 3.9 mmol/L (ref 3.5–5.1)
Sodium: 141 mmol/L (ref 135–145)
Total Bilirubin: 0.4 mg/dL (ref 0.3–1.2)
Total Protein: 8 g/dL (ref 6.5–8.1)

## 2019-11-04 LAB — TYPE AND SCREEN
ABO/RH(D): O POS
Antibody Screen: NEGATIVE

## 2019-11-04 LAB — CBC
HCT: 42.1 % (ref 36.0–46.0)
Hemoglobin: 13 g/dL (ref 12.0–15.0)
MCH: 27.7 pg (ref 26.0–34.0)
MCHC: 30.9 g/dL (ref 30.0–36.0)
MCV: 89.6 fL (ref 80.0–100.0)
Platelets: 209 10*3/uL (ref 150–400)
RBC: 4.7 MIL/uL (ref 3.87–5.11)
RDW: 16.5 % — ABNORMAL HIGH (ref 11.5–15.5)
WBC: 4.8 10*3/uL (ref 4.0–10.5)
nRBC: 0 % (ref 0.0–0.2)

## 2019-11-04 MED ORDER — MECLIZINE HCL 25 MG PO TABS
25.0000 mg | ORAL_TABLET | Freq: Once | ORAL | Status: AC
  Administered 2019-11-04: 25 mg via ORAL

## 2019-11-04 MED ORDER — SODIUM CHLORIDE 0.9 % IV BOLUS
500.0000 mL | Freq: Once | INTRAVENOUS | Status: AC
  Administered 2019-11-04: 500 mL via INTRAVENOUS

## 2019-11-04 MED ORDER — SODIUM CHLORIDE 0.9 % IV BOLUS: 500.0000 mL | Freq: Once | INTRAVENOUS | Status: DC

## 2019-11-04 MED ORDER — MECLIZINE HCL 25 MG PO TABS
25.0000 mg | ORAL_TABLET | Freq: Once | ORAL | Status: DC
  Filled 2019-11-04: qty 1

## 2019-11-04 MED ORDER — IOHEXOL 300 MG/ML  SOLN
100.0000 mL | Freq: Once | INTRAMUSCULAR | Status: AC | PRN
  Administered 2019-11-04: 100 mL via INTRAVENOUS

## 2019-11-04 MED ORDER — IOHEXOL 9 MG/ML PO SOLN: 500.0000 mL | Freq: Once | ORAL | Status: DC | PRN

## 2019-11-04 NOTE — ED Notes (Signed)
Pt taken to CT.

## 2019-11-04 NOTE — ED Triage Notes (Signed)
Pt presents to ED via POV with c/o dark red rectal bleeding that started on Saturday. Pt states hx of diverticulosis and "whenever [she] eats some food with seeds [she] bleeds". Pt presents A&O x4 at this time.

## 2019-11-04 NOTE — ED Provider Notes (Signed)
Patient received out in signout from Dr. Cherylann Banas.  Hemoglobins have been stable.  She is not having any episodes of melena or hematochezia since being in the ER.  Her scan does show evidence of extensive diverticulosis.  She does have follow-up with GI.  She states that she feels well not having any pain.  Notes any evidence of acute diverticulitis.  Discussed option for observation versus continued outpatient follow-up and patient feels comfortable with outpatient follow-up which I think is reasonable given the absence of evidence of active bleeding.  Have discussed with the patient and available family all diagnostics and treatments performed thus far and all questions were answered to the best of my ability. The patient demonstrates understanding and agreement with plan.    Merlyn Lot, MD 11/04/19 1616

## 2019-11-04 NOTE — Discharge Instructions (Addendum)

## 2019-11-04 NOTE — ED Provider Notes (Signed)
[facility] Emergency Department Provider Note ____________________________________________   First MD Initiated Contact with Patient 11/04/19 1340     (approximate)  I have reviewed the triage vital signs and the nursing notes.   HISTORY  Chief Complaint GI Bleeding    HPI Taylor Johnson is a 74 y.o. female with PMH as noted below who presents with blood in the stool, 2 episodes over the last several days, described as dark red in the toilet and on her depends today.  The patient reports some lower abdominal pain over the last few days.  She states that she has some blood in the stool every time she eats food with seeds in it.  She denies any nausea or vomiting.  Patient states that she feels a bit dizzy, but denies any weakness.  Past Medical History:  Diagnosis Date  . Abdominal pain 01/27/2017  . Anxiety   . Anxiety, generalized 05/11/2015  . Asthma   . Cough 06/02/2016   Chronic - followed by Pulmonary  . Depression   . Developmental delay   . Diverticulitis   . Diverticulosis of colon 05/11/2015  . Dysphagia 10/15/2015   Routine Ba Swallow normal; rec modified barium swallow   . Hypoxia 09/12/2015  . Leg swelling   . Localized edema 04/15/2016  . Microscopic hematuria 04/15/2016  . Nausea and vomiting 01/29/2017  . Orthostatic hypotension 03/16/2017  . OSA on CPAP 11/18/2015   CPAP @ 18 cm H2O started 02/2016  . Rectal bleeding 10/15/2018  . Urge incontinence of urine 05/12/2015    Patient Active Problem List   Diagnosis Date Noted  . Dizziness 02/04/2019  . Benign essential HTN 10/23/2018  . Functional systolic murmur 0000000  . Developmental delay disorder 02/06/2017  . Calculus of gallbladder without cholecystitis without obstruction 01/20/2017  . Cough 06/02/2016  . Localized edema 04/15/2016  . Microscopic hematuria 04/15/2016  . OSA on CPAP 11/18/2015  . Dysphagia 10/15/2015  . Urge incontinence of urine 05/12/2015  .  Diverticulosis of colon 05/11/2015  . Anxiety, generalized 05/11/2015    Past Surgical History:  Procedure Laterality Date  . ABDOMINAL HYSTERECTOMY    . BREAST BIOPSY Left    neg  . COLON SURGERY    . ESOPHAGOGASTRODUODENOSCOPY (EGD) WITH PROPOFOL N/A 01/29/2017   Procedure: ESOPHAGOGASTRODUODENOSCOPY (EGD) WITH PROPOFOL;  Surgeon: Wilford Corner, MD;  Location: [facility] ENDOSCOPY;  Service: Endoscopy;  Laterality: N/A;    Prior to Admission medications   Medication Sig Start Date End Date Taking? Authorizing Provider  aspirin 81 MG EC tablet Take 1 tablet (81 mg total) by mouth daily. 08/09/19  Yes [name], MD  hydrALAZINE (APRESOLINE) 25 MG tablet Take 1 tablet (25 mg total) by mouth 3 (three) times daily. 10/30/19  Yes [name], MD  metoCLOPramide (REGLAN) 5 MG tablet Take 1 tablet (5 mg total) by mouth 3 (three) times daily before meals. 08/09/19 11/04/19 Yes [name], MD  pantoprazole (PROTONIX) 40 MG tablet Take 1 tablet (40 mg total) by mouth daily. 07/25/19 11/04/19 Yes Menshew, Dannielle Karvonen, PA-C  vitamin B-12 (CYANOCOBALAMIN) 500 MCG tablet Take 500 mcg by mouth daily.   Yes [provider]  meclizine (ANTIVERT) 12.5 MG tablet Take 12.5 mg by mouth 3 (three) times daily as needed. 11/01/19   [provider]    Allergies Patient has no known allergies.  Family History  Problem Relation Age of Onset  . Hypertension Mother   . Kidney disease  Mother   . Dementia Mother   . Cancer Father        lung  . Bladder Cancer Neg Hx   . Breast cancer Neg Hx     Social History Social History   Tobacco Use  . Smoking status: Never Smoker  . Smokeless tobacco: Never Used  . Tobacco comment: smoking cessation materials not required  Substance Use Topics  . Alcohol use: No    Alcohol/week: 0.0 standard drinks  . Drug use: No    Review of Systems  Constitutional: No fever. Eyes: No redness. ENT: No sore throat. Cardiovascular:  Denies chest pain. Respiratory: Denies shortness of breath. Gastrointestinal: No vomiting or diarrhea.   Genitourinary: Negative for dysuria.  Musculoskeletal: Negative for back pain. Skin: Negative for rash. Neurological: Negative for headache.   ____________________________________________   PHYSICAL EXAM:  VITAL SIGNS: ED Triage Vitals  Enc Vitals Group     BP 11/04/19 0944 (!) 174/84     Pulse Rate 11/04/19 0944 61     Resp 11/04/19 0944 18     Temp 11/04/19 0944 98.8 F (37.1 C)     Temp Source 11/04/19 0944 Oral     SpO2 11/04/19 0944 96 %     Weight 11/04/19 0940 246 lb 14.6 oz (112 kg)     Height 11/04/19 0940 5\' 4"  (1.626 m)     Head Circumference --      Peak Flow --      Pain Score --      Pain Loc --      Pain Edu? --      Excl. in [place]? --     Constitutional: Alert and oriented. Relatively well appearing and in no acute distress. Eyes: Conjunctivae are normal.  Head: Atraumatic. Nose: No congestion/rhinnorhea. Mouth/Throat: Mucous membranes are moist.   Neck: Normal range of motion.  Cardiovascular: Good peripheral circulation. Respiratory: Normal respiratory effort.  No retractions.  Gastrointestinal: Soft with mild bilateral lower abdominal tenderness. No distention. Brown stool, guaiac negative on DRE.  Genitourinary: No flank tenderness. Musculoskeletal: Extremities warm and well perfused.  Neurologic:  Normal speech and language. No gross focal neurologic deficits are appreciated.  Skin:  Skin is warm and dry. No rash noted. Psychiatric: Mood and affect are normal. Speech and behavior are normal.  ____________________________________________   LABS (all labs ordered are listed, but only abnormal results are displayed)  Labs Reviewed  COMPREHENSIVE METABOLIC PANEL - Abnormal; Notable for the following components:      Result Value   Glucose, Bld 103 (*)    AST 12 (*)    All other components within normal limits  CBC - Abnormal; Notable for  the following components:   RDW 16.5 (*)    All other components within normal limits  CBC WITH DIFFERENTIAL/PLATELET - Abnormal; Notable for the following components:   RDW 16.6 (*)    All other components within normal limits  POC OCCULT BLOOD, ED  TYPE AND SCREEN   ____________________________________________  EKG   ____________________________________________  RADIOLOGY  CT abdomen: Pending.   ____________________________________________   PROCEDURES  Procedure(s) performed: No  Procedures  Critical Care performed: No ____________________________________________   INITIAL IMPRESSION / ASSESSMENT AND PLAN / ED COURSE  Pertinent labs & imaging results that were available during my care of the patient were reviewed by me and considered in my medical decision making (see chart for details).  74 year old female with PMH as noted above including diverticulosis presents with blood  in her stool, 2 episodes over the last several days.  The patient also reports mild abdominal discomfort.  I reviewed the past medical records in [place].  The patient was seen in the ED last month with a similar episode.  At that time she had stable blood counts and guaiac negative stool.  On exam, the patient is overall well-appearing.  Her vital signs are normal except for hypertension the abdomen is soft with mild bilateral lower abdominal discomfort but no focal tenderness.  On DRE, she has brown stool which is again guaiac negative.  Overall, I suspect most likely diverticulosis versus possible acute diverticulitis, or other benign etiology of intermittent lower GI bleeding such as an internal hemorrhoid.  Initial lab work-up is unremarkable.  We will obtain a repeat CBC, a CT of the abdomen, and reassess.  ----------------------------------------- 3:25 PM on 11/04/2019 -----------------------------------------  Repeat CBC shows no significant change.  The CT is pending.  The patient also  started to have some dizziness after moving around in the bed, which she describes as spinning.  She has had this before and has had work-up including an MRI.  This is most consistent with peripheral vertigo.  I have ordered meclizine.  I signed the patient out to the oncoming physician Dr. Quentin Cornwall.   ____________________________________________   FINAL CLINICAL IMPRESSION(S) / ED DIAGNOSES  Final diagnoses:  Lower GI bleeding      NEW MEDICATIONS STARTED DURING THIS VISIT:  New Prescriptions   No medications on file     Note:  This document was prepared using Dragon voice recognition software and may include unintentional dictation errors.    Arta Silence, MD 11/04/19 1525

## 2019-11-07 ENCOUNTER — Observation Stay
Admission: EM | Admit: 2019-11-07 | Discharge: 2019-11-08 | Disposition: A | Discharge status: Home / Self Care | Payer: Medicare Other | Attending: Internal Medicine | Admitting: Internal Medicine

## 2019-11-07 ENCOUNTER — Other Ambulatory Visit: Payer: Self-pay

## 2019-11-07 ENCOUNTER — Encounter: Payer: Self-pay | Admitting: Emergency Medicine

## 2019-11-07 ENCOUNTER — Ambulatory Visit: Payer: Self-pay | Admitting: *Deleted

## 2019-11-07 DIAGNOSIS — Z90710 Acquired absence of both cervix and uterus: Secondary | ICD-10-CM | POA: Insufficient documentation

## 2019-11-07 DIAGNOSIS — J449 Chronic obstructive pulmonary disease, unspecified: Secondary | ICD-10-CM | POA: Insufficient documentation

## 2019-11-07 DIAGNOSIS — Z20822 Contact with and (suspected) exposure to covid-19: Secondary | ICD-10-CM | POA: Diagnosis not present

## 2019-11-07 DIAGNOSIS — Z7982 Long term (current) use of aspirin: Secondary | ICD-10-CM | POA: Diagnosis not present

## 2019-11-07 DIAGNOSIS — R109 Unspecified abdominal pain: Secondary | ICD-10-CM | POA: Diagnosis not present

## 2019-11-07 DIAGNOSIS — F419 Anxiety disorder, unspecified: Secondary | ICD-10-CM | POA: Insufficient documentation

## 2019-11-07 DIAGNOSIS — D259 Leiomyoma of uterus, unspecified: Secondary | ICD-10-CM | POA: Diagnosis not present

## 2019-11-07 DIAGNOSIS — Z6839 Body mass index (BMI) 39.0-39.9, adult: Secondary | ICD-10-CM | POA: Insufficient documentation

## 2019-11-07 DIAGNOSIS — Z820 Family history of epilepsy and other diseases of the nervous system: Secondary | ICD-10-CM | POA: Diagnosis not present

## 2019-11-07 DIAGNOSIS — I10 Essential (primary) hypertension: Secondary | ICD-10-CM | POA: Insufficient documentation

## 2019-11-07 DIAGNOSIS — Z8249 Family history of ischemic heart disease and other diseases of the circulatory system: Secondary | ICD-10-CM | POA: Diagnosis not present

## 2019-11-07 DIAGNOSIS — Z9989 Dependence on other enabling machines and devices: Secondary | ICD-10-CM

## 2019-11-07 DIAGNOSIS — G4733 Obstructive sleep apnea (adult) (pediatric): Secondary | ICD-10-CM | POA: Insufficient documentation

## 2019-11-07 DIAGNOSIS — K921 Melena: Secondary | ICD-10-CM

## 2019-11-07 DIAGNOSIS — Z801 Family history of malignant neoplasm of trachea, bronchus and lung: Secondary | ICD-10-CM | POA: Insufficient documentation

## 2019-11-07 DIAGNOSIS — Z79899 Other long term (current) drug therapy: Secondary | ICD-10-CM | POA: Insufficient documentation

## 2019-11-07 DIAGNOSIS — I313 Pericardial effusion (noninflammatory): Secondary | ICD-10-CM | POA: Insufficient documentation

## 2019-11-07 DIAGNOSIS — K5790 Diverticulosis of intestine, part unspecified, without perforation or abscess without bleeding: Secondary | ICD-10-CM

## 2019-11-07 DIAGNOSIS — R1030 Lower abdominal pain, unspecified: Secondary | ICD-10-CM | POA: Diagnosis not present

## 2019-11-07 DIAGNOSIS — K625 Hemorrhage of anus and rectum: Secondary | ICD-10-CM

## 2019-11-07 DIAGNOSIS — Z841 Family history of disorders of kidney and ureter: Secondary | ICD-10-CM | POA: Insufficient documentation

## 2019-11-07 DIAGNOSIS — K219 Gastro-esophageal reflux disease without esophagitis: Secondary | ICD-10-CM | POA: Diagnosis not present

## 2019-11-07 HISTORY — DX: Melena: K92.1

## 2019-11-07 LAB — COMPREHENSIVE METABOLIC PANEL
ALT: 11 U/L (ref 0–44)
AST: 14 U/L — ABNORMAL LOW (ref 15–41)
Albumin: 4.2 g/dL (ref 3.5–5.0)
Alkaline Phosphatase: 92 U/L (ref 38–126)
Anion gap: 10 (ref 5–15)
BUN: 17 mg/dL (ref 8–23)
CO2: 30 mmol/L (ref 22–32)
Calcium: 9.2 mg/dL (ref 8.9–10.3)
Chloride: 100 mmol/L (ref 98–111)
Creatinine, Ser: 0.97 mg/dL (ref 0.44–1.00)
GFR calc Af Amer: >60 mL/min (ref >60)
GFR calc non Af Amer: 58 mL/min — ABNORMAL LOW (ref >60)
Glucose, Bld: 104 mg/dL — ABNORMAL HIGH (ref 70–99)
Potassium: 3.7 mmol/L (ref 3.5–5.1)
Sodium: 140 mmol/L (ref 135–145)
Total Bilirubin: 0.4 mg/dL (ref 0.3–1.2)
Total Protein: 8.1 g/dL (ref 6.5–8.1)

## 2019-11-07 LAB — CBC
HCT: 42.6 % (ref 36.0–46.0)
Hemoglobin: 12.8 g/dL (ref 12.0–15.0)
MCH: 27.5 pg (ref 26.0–34.0)
MCHC: 30 g/dL (ref 30.0–36.0)
MCV: 91.6 fL (ref 80.0–100.0)
Platelets: 208 10*3/uL (ref 150–400)
RBC: 4.65 MIL/uL (ref 3.87–5.11)
RDW: 16.5 % — ABNORMAL HIGH (ref 11.5–15.5)
WBC: 4.6 10*3/uL (ref 4.0–10.5)
nRBC: 0 % (ref 0.0–0.2)

## 2019-11-07 LAB — TYPE AND SCREEN
ABO/RH(D): O POS
Antibody Screen: NEGATIVE

## 2019-11-07 LAB — SARS CORONAVIRUS 2 (TAT 6-24 HRS): SARS Coronavirus 2: NEGATIVE

## 2019-11-07 MED ORDER — HYDROCODONE-ACETAMINOPHEN 5-325 MG PO TABS
1.0000 | ORAL_TABLET | Freq: Four times a day (QID) | ORAL | Status: DC | PRN
  Administered 2019-11-07 – 2019-11-08 (×2): 1 via ORAL
  Filled 2019-11-07 (×2): qty 1

## 2019-11-07 MED ORDER — SIMETHICONE 80 MG PO CHEW
80.0000 mg | CHEWABLE_TABLET | Freq: Four times a day (QID) | ORAL | Status: DC
  Administered 2019-11-08 (×2): 80 mg via ORAL
  Filled 2019-11-07 (×6): qty 1

## 2019-11-07 MED ORDER — PANTOPRAZOLE SODIUM 40 MG PO TBEC
40.0000 mg | DELAYED_RELEASE_TABLET | Freq: Every day | ORAL | Status: DC
  Administered 2019-11-08: 40 mg via ORAL
  Filled 2019-11-07: qty 1

## 2019-11-07 MED ORDER — ACETAMINOPHEN 650 MG RE SUPP: 650.0000 mg | Freq: Four times a day (QID) | RECTAL | Status: DC | PRN

## 2019-11-07 MED ORDER — MORPHINE SULFATE (PF) 2 MG/ML IV SOLN: 2.0000 mg | INTRAVENOUS | Status: DC | PRN

## 2019-11-07 MED ORDER — ONDANSETRON HCL 4 MG PO TABS: 4.0000 mg | ORAL_TABLET | Freq: Four times a day (QID) | ORAL | Status: DC | PRN

## 2019-11-07 MED ORDER — TRAZODONE HCL 50 MG PO TABS: 25.0000 mg | ORAL_TABLET | Freq: Every evening | ORAL | Status: DC | PRN

## 2019-11-07 MED ORDER — HYDRALAZINE HCL 20 MG/ML IJ SOLN: 10.0000 mg | INTRAMUSCULAR | Status: DC | PRN

## 2019-11-07 MED ORDER — ACETAMINOPHEN 325 MG PO TABS: 650.0000 mg | ORAL_TABLET | Freq: Four times a day (QID) | ORAL | Status: DC | PRN

## 2019-11-07 MED ORDER — POLYETHYLENE GLYCOL 3350 17 G PO PACK: 17.0000 g | PACK | Freq: Every day | ORAL | Status: DC | PRN

## 2019-11-07 MED ORDER — HYDRALAZINE HCL 50 MG PO TABS
25.0000 mg | ORAL_TABLET | Freq: Three times a day (TID) | ORAL | Status: DC
  Administered 2019-11-07 – 2019-11-08 (×2): 25 mg via ORAL
  Filled 2019-11-07 (×2): qty 1

## 2019-11-07 MED ORDER — ONDANSETRON HCL 4 MG/2ML IJ SOLN: 4.0000 mg | Freq: Four times a day (QID) | INTRAMUSCULAR | Status: DC | PRN

## 2019-11-07 NOTE — ED Triage Notes (Signed)
Pt presents to ED c/o dark red rectal bleeding. Pt was seen for same 4/19 and offered admit for obs but decided to follow up outpatient. C/o mid abd pain. Takes 81mg  ASA.

## 2019-11-07 NOTE — Plan of Care (Signed)
  Problem: Education: Goal: Knowledge of General Education information will improve Description Including pain rating scale, medication(s)/side effects and non-pharmacologic comfort measures Outcome: Progressing   

## 2019-11-07 NOTE — ED Provider Notes (Signed)
[facility] Emergency Department Provider Note    First MD Initiated Contact with Patient 11/07/19 1556     (approximate)  I have reviewed the triage vital signs and the nursing notes.   HISTORY  Chief Complaint GI Bleeding    HPI Taylor Johnson is a 74 y.o. female [place] past medical history presents to the ER for her third time an episode of bright red blood per rectum.  Is also having some intermittent abdominal pain associated with this.  Does bring in sample of stool does show bright red blood and states that she did feel up to commode today.  Fortunately her hemoglobin is stable.  She is reportedly been trying to follow-up in outpatient clinic but has not been able to.  Does take aspirin but no other blood thinners.    Past Medical History:  Diagnosis Date  . Abdominal pain 01/27/2017  . Anxiety   . Anxiety, generalized 05/11/2015  . Asthma   . Cough 06/02/2016   Chronic - followed by Pulmonary  . Depression   . Developmental delay   . Diverticulitis   . Diverticulosis of colon 05/11/2015  . Dysphagia 10/15/2015   Routine Ba Swallow normal; rec modified barium swallow   . Hypoxia 09/12/2015  . Leg swelling   . Localized edema 04/15/2016  . Microscopic hematuria 04/15/2016  . Nausea and vomiting 01/29/2017  . Orthostatic hypotension 03/16/2017  . OSA on CPAP 11/18/2015   CPAP @ 18 cm H2O started 02/2016  . Rectal bleeding 10/15/2018  . Urge incontinence of urine 05/12/2015   Family History  Problem Relation Age of Onset  . Hypertension Mother   . Kidney disease Mother   . Dementia Mother   . Cancer Father        lung  . Bladder Cancer Neg Hx   . Breast cancer Neg Hx    Past Surgical History:  Procedure Laterality Date  . ABDOMINAL HYSTERECTOMY    . BREAST BIOPSY Left    neg  . COLON SURGERY    . ESOPHAGOGASTRODUODENOSCOPY (EGD) WITH PROPOFOL N/A 01/29/2017   Procedure: ESOPHAGOGASTRODUODENOSCOPY (EGD) WITH PROPOFOL;  Surgeon: Wilford Corner, MD;  Location: [facility] ENDOSCOPY;  Service: Endoscopy;  Laterality: N/A;   Patient Active Problem List   Diagnosis Date Noted  . Dizziness 02/04/2019  . Benign essential HTN 10/23/2018  . Functional systolic murmur 0000000  . Developmental delay disorder 02/06/2017  . Calculus of gallbladder without cholecystitis without obstruction 01/20/2017  . Cough 06/02/2016  . Localized edema 04/15/2016  . Microscopic hematuria 04/15/2016  . OSA on CPAP 11/18/2015  . Dysphagia 10/15/2015  . Urge incontinence of urine 05/12/2015  . Diverticulosis of colon 05/11/2015  . Anxiety, generalized 05/11/2015      Prior to Admission medications   Medication Sig Start Date End Date Taking? Authorizing Provider  aspirin 81 MG EC tablet Take 1 tablet (81 mg total) by mouth daily. 08/09/19   [name], MD  hydrALAZINE (APRESOLINE) 25 MG tablet Take 1 tablet (25 mg total) by mouth 3 (three) times daily. 10/30/19   [name], MD  meclizine (ANTIVERT) 12.5 MG tablet Take 12.5 mg by mouth 3 (three) times daily as needed. 11/01/19   [provider]  metoCLOPramide (REGLAN) 5 MG tablet Take 1 tablet (5 mg total) by mouth 3 (three) times daily before meals. 08/09/19 11/04/19  [name], MD  pantoprazole (PROTONIX) 40 MG tablet Take 1 tablet (40 mg total) by mouth  daily. 07/25/19 11/04/19  Menshew, Dannielle Karvonen, PA-C  vitamin B-12 (CYANOCOBALAMIN) 500 MCG tablet Take 500 mcg by mouth daily.    [provider]    Allergies Patient has no known allergies.    Social History Social History   Tobacco Use  . Smoking status: Never Smoker  . Smokeless tobacco: Never Used  . Tobacco comment: smoking cessation materials not required  Substance Use Topics  . Alcohol use: No    Alcohol/week: 0.0 standard drinks  . Drug use: No    Review of Systems Patient denies headaches, rhinorrhea, blurry vision, numbness, shortness of breath, chest pain, edema, cough, abdominal  pain, nausea, vomiting, diarrhea, dysuria, fevers, rashes or hallucinations unless otherwise stated above in HPI. ____________________________________________   PHYSICAL EXAM:  VITAL SIGNS: Vitals:   11/07/19 1335  BP: (!) 180/67  Pulse: (!) 58  Resp: 17  Temp: 98 F (36.7 C)  SpO2: 95%    Constitutional: Alert and oriented.  Eyes: Conjunctivae are normal.  Head: Atraumatic. Nose: No congestion/rhinnorhea. Mouth/Throat: Mucous membranes are moist.   Neck: No stridor. Painless ROM.  Cardiovascular: Normal rate, regular rhythm. Grossly normal heart sounds.  Good peripheral circulation. Respiratory: Normal respiratory effort.  No retractions. Lungs CTAB. Gastrointestinal: Soft and nontender. No distention. No abdominal bruits. No CVA tenderness. Genitourinary: deferred Musculoskeletal: No lower extremity tenderness nor edema.  No joint effusions. Neurologic:  Normal speech and language. No gross focal neurologic deficits are appreciated. No facial droop Skin:  Skin is warm, dry and intact. No rash noted. Psychiatric: Mood and affect are normal. Speech and behavior are normal.  ____________________________________________   LABS (all labs ordered are listed, but only abnormal results are displayed)  Results for orders placed or performed during the hospital encounter of 11/07/19 (from the past 24 hour(s))  Comprehensive metabolic panel     Status: Abnormal   Collection Time: 11/07/19  1:37 PM  Result Value Ref Range   Sodium 140 135 - 145 mmol/L   Potassium 3.7 3.5 - 5.1 mmol/L   Chloride 100 98 - 111 mmol/L   CO2 30 22 - 32 mmol/L   Glucose, Bld 104 (H) 70 - 99 mg/dL   BUN 17 8 - 23 mg/dL   Creatinine, Ser 0.97 0.44 - 1.00 mg/dL   Calcium 9.2 8.9 - 10.3 mg/dL   Total Protein 8.1 6.5 - 8.1 g/dL   Albumin 4.2 3.5 - 5.0 g/dL   AST 14 (L) 15 - 41 U/L   ALT 11 0 - 44 U/L   Alkaline Phosphatase 92 38 - 126 U/L   Total Bilirubin 0.4 0.3 - 1.2 mg/dL   GFR calc non Af Amer  58 (L) >60 mL/min   GFR calc Af Amer >60 >60 mL/min   Anion gap 10 5 - 15  CBC     Status: Abnormal   Collection Time: 11/07/19  1:37 PM  Result Value Ref Range   WBC 4.6 4.0 - 10.5 K/uL   RBC 4.65 3.87 - 5.11 MIL/uL   Hemoglobin 12.8 12.0 - 15.0 g/dL   HCT 42.6 36.0 - 46.0 %   MCV 91.6 80.0 - 100.0 fL   MCH 27.5 26.0 - 34.0 pg   MCHC 30.0 30.0 - 36.0 g/dL   RDW 16.5 (H) 11.5 - 15.5 %   Platelets 208 150 - 400 K/uL   nRBC 0.0 0.0 - 0.2 %  Type and screen [facility]     Status: None   Collection  Time: 11/07/19  1:37 PM  Result Value Ref Range   ABO/RH(D) O POS    Antibody Screen NEG    Sample Expiration      11/10/2019,2359 Performed at [facility], [address] [place], [place] [address]    ____________________________________________ ____________________________________________  __________________________   PROCEDURES  Procedure(s) performed:  Procedures    Critical Care performed: no ____________________________________________   INITIAL IMPRESSION / ASSESSMENT AND PLAN / ED COURSE  Pertinent labs & imaging results that were available during my care of the patient were reviewed by me and considered in my medical decision making (see chart for details).   DDX: Diverticulosis, lower GI bleed, hemorrhoid, fissure, upper GI bleed  Taylor Johnson is a 74 y.o. who presents to the ED with symptoms as described above.  Patient nontoxic-appearing but is having persistent hematochezia.  Is now her third visit to the ER within the past month for similar symptoms and has been unable to follow-up as an outpatient.  Given her extensive diverticular disease this point I think we do need to observe her in the hospital GI consultation.  She not having any melena.  Her hemoglobin is stable therefore I do not think that she needs transfusion or additional imaging at this time.  Will discuss with hospitalist for admission     The patient was  evaluated in Emergency Department today for the symptoms described in the history of present illness. He/she was evaluated in the context of the global COVID-19 pandemic, which necessitated consideration that the patient might be at risk for infection with the SARS-CoV-2 virus that causes COVID-19. Institutional protocols and algorithms that pertain to the evaluation of patients at risk for COVID-19 are in a state of rapid change based on information released by regulatory bodies including the CDC and federal and state organizations. These policies and algorithms were followed during the patient's care in the ED.  As part of my medical decision making, I reviewed the following data within the [facility] notes reviewed and incorporated, Labs reviewed, notes from prior ED visits and [place] Controlled Substance Database   ____________________________________________   FINAL CLINICAL IMPRESSION(S) / ED DIAGNOSES  Final diagnoses:  Bright red blood per rectum  Diverticulosis      NEW MEDICATIONS STARTED DURING THIS VISIT:  New Prescriptions   No medications on file     Note:  This document was prepared using Dragon voice recognition software and may include unintentional dictation errors.    Merlyn Lot, MD 11/07/19 [phone]

## 2019-11-07 NOTE — ED Notes (Signed)
Family member asking for pillow and blankets. Pt given 2 warm blankets and heat increased. Pt has pillow,. Bed adjusted for comfort. Family member taking pictures in room. Family member asking for lab results, plan of care. Explained to family member I was just in there to answer the call light. Explained possible plans of care for diverticulitis and GI bleed. Explained that the labs will be repeated and reviewed by MD and then he can give her a better idea.

## 2019-11-07 NOTE — Telephone Encounter (Signed)
Patient's family is calling back to report patient is having severe abdominal pain and increasing amount of blood in depend. Advised back to ED.  Reason for Disposition . [1] SEVERE pain AND [2] age > 16  Answer Assessment - Initial Assessment Questions 1. LOCATION: "Where does it hurt?"      midline 2. RADIATION: "Does the pain shoot anywhere else?" (e.g., chest, back)     no 3. ONSET: "When did the pain begin?" (e.g., minutes, hours or days ago)     Comes and goes- last night 4. SUDDEN: "Gradual or sudden onset?"     unsure 5. PATTERN "Does the pain come and go, or is it constant?"    - If constant: "Is it getting better, staying the same, or worsening?"      (Note: Constant means the pain never goes away completely; most serious pain is constant and it progresses)     - If intermittent: "How long does it last?" "Do you have pain now?"     (Note: Intermittent means the pain goes away completely between bouts)     constant 6. SEVERITY: "How bad is the pain?"  (e.g., Scale 1-10; mild, moderate, or severe)   - MILD (1-3): doesn't interfere with normal activities, abdomen soft and not tender to touch    - MODERATE (4-7): interferes with normal activities or awakens from sleep, tender to touch    - SEVERE (8-10): excruciating pain, doubled over, unable to do any normal activities      10 7. RECURRENT SYMPTOM: "Have you ever had this type of abdominal pain before?" If so, ask: "When was the last time?" and "What happened that time?"      Yes- patient was seen at ED- test done, fluids- lower GI bleed 8. CAUSE: "What do you think is causing the abdominal pain?"     same 9. RELIEVING/AGGRAVATING FACTORS: "What makes it better or worse?" (e.g., movement, antacids, bowel movement)     no 10. OTHER SYMPTOMS: "Has there been any vomiting, diarrhea, constipation, or urine problems?"       Seeing blood in depend- moderate- has been seeing this all along- increase in volume 11. PREGNANCY: "Is  there any chance you are pregnant?" "When was your last menstrual period?"       n/a  Protocols used: ABDOMINAL PAIN - [facility]

## 2019-11-07 NOTE — H&P (Signed)
History and Physical    Ellanor Mehlhaff Khader [id number] DOB: Jan 26, 1946 DOA: 11/07/2019  PCP: [name], MD  Patient coming from: home  I have personally briefly reviewed patient's old medical records in [place]  Chief Complaint: abdominal pain, hematochezia  HPI: KHAMIL PICKETT is a 74 y.o. female with medical history significant of hypertension, GERD, anxiety, OSA on CPAP, morbid obesity who presented to the ED today with ongoing abdominal pain and bright red blood per rectum.  This was patient's third visit to the ED this month for same complaints.  She was to follow-up outpatient with GI, but has not yet gotten in with them.  Regarding the bleeding, patient reports intermittently noticing blood with bowel movements and also in her depend.  She states this frightens her when she sees it, sometimes looks like very little but other times much more.  She denies being on any blood thinners, does take aspirin.  She denies pain or discomfort with passing stool, and states she has had no issues with constipation.  Regarding her abdominal pain, she points to her middle and lower left/mid abdomen, describes as sharp and sometimes cramping, episodes sometimes last for seconds to minutes and then resolve.  She will have waxing waning episodes like this that last a few days and resolved, this has been going on for she thinks a few months.  She denies nausea or vomiting diarrhea, fevers or chills.  She reports a good appetite.  She thinks abdominal pain is sometimes worse after eating.  She is found no alleviating factors at home.  ED Course: Hypertensive 180/67 otherwise vitals stable.  CBC was remarkable only for RDW 16.5.  CMP was notable for glucose 104, AST 14.  CT abdomen pelvis showed no acute abnormality, very large number of colonic diverticula without evidence of diverticulitis, a stable large diverticulum arising from the 2nd portion of the duodenum displacing the pancreatic head  superiorly to the left.  Given patient's multiple visits to the ED for these complaints, patient is admitted for observation and GI consult.  Review of Systems: As per HPI otherwise 10 point review of systems negative.    Past Medical History:  Diagnosis Date  . Abdominal pain 01/27/2017  . Anxiety   . Anxiety, generalized 05/11/2015  . Asthma   . Cough 06/02/2016   Chronic - followed by Pulmonary  . Depression   . Developmental delay   . Diverticulitis   . Diverticulosis of colon 05/11/2015  . Dysphagia 10/15/2015   Routine Ba Swallow normal; rec modified barium swallow   . Hypoxia 09/12/2015  . Leg swelling   . Localized edema 04/15/2016  . Microscopic hematuria 04/15/2016  . Nausea and vomiting 01/29/2017  . Orthostatic hypotension 03/16/2017  . OSA on CPAP 11/18/2015   CPAP @ 18 cm H2O started 02/2016  . Rectal bleeding 10/15/2018  . Urge incontinence of urine 05/12/2015    Past Surgical History:  Procedure Laterality Date  . ABDOMINAL HYSTERECTOMY    . BREAST BIOPSY Left    neg  . COLON SURGERY    . ESOPHAGOGASTRODUODENOSCOPY (EGD) WITH PROPOFOL N/A 01/29/2017   Procedure: ESOPHAGOGASTRODUODENOSCOPY (EGD) WITH PROPOFOL;  Surgeon: Wilford Corner, MD;  [facility] ENDOSCOPY;  Service: Endoscopy;  Laterality: N/A;     reports that she has never smoked. She has never used smokeless tobacco. She reports that she does not drink alcohol or use drugs.  No Known Allergies  Family History  Problem Relation Age of Onset  .  Hypertension Mother   . Kidney disease Mother   . Dementia Mother   . Cancer Father        lung  . Bladder Cancer Neg Hx   . Breast cancer Neg Hx      Prior to Admission medications   Medication Sig Start Date End Date Taking? Authorizing Provider  aspirin 81 MG EC tablet Take 1 tablet (81 mg total) by mouth daily. 08/09/19   [name], MD  hydrALAZINE (APRESOLINE) 25 MG tablet Take 1 tablet (25 mg total) by mouth 3 (three) times daily.  10/30/19   [name], MD  meclizine (ANTIVERT) 12.5 MG tablet Take 12.5 mg by mouth 3 (three) times daily as needed. 11/01/19   [provider]  metoCLOPramide (REGLAN) 5 MG tablet Take 1 tablet (5 mg total) by mouth 3 (three) times daily before meals. 08/09/19 11/04/19  [name], MD  pantoprazole (PROTONIX) 40 MG tablet Take 1 tablet (40 mg total) by mouth daily. 07/25/19 11/04/19  Menshew, Dannielle Karvonen, PA-C  vitamin B-12 (CYANOCOBALAMIN) 500 MCG tablet Take 500 mcg by mouth daily.    [provider]    Physical Exam: Vitals:   11/07/19 1335 11/07/19 1336 11/07/19 1632 11/07/19 1700  BP: (!) 180/67  (!) 138/101   Pulse: (!) 58  64   Resp: 17  14   Temp: 98 F (36.7 C)   98.6 F (37 C)  TempSrc: Oral   Oral  SpO2: 95%  99%   Weight:  112 kg    Height:  5\' 4"  (1.626 m)      Constitutional: NAD, calm, comfortable, obese Eyes: EOMI, lids and conjunctivae normal ENMT: Mucous membranes are moist.  Hearing grossly normal Respiratory: CTAB, no wheezing, no crackles. Normal respiratory effort. No accessory muscle use.  Cardiovascular: RRR, no murmurs / rubs / gallops. No extremity edema. 2+ pedal pulses. No carotid bruits.  Abdomen: soft, tenderness with voluntary guarding of the left and mid lower abdomen and just superior to the umbilicus as well, no rebound tenderness, ND, no masses or HSM palpated.  Hyperactive sounds.  Musculoskeletal: no clubbing / cyanosis. No joint deformity upper and lower extremities. Normal muscle tone.  Skin: dry, intact, normal color, normal temperature Neurologic: CN 2-12 grossly intact. Normal speech.  Grossly non-focal exam. Psychiatric: Alert and oriented x 3. Normal mood. Congruent affect.  Normal judgement and insight.  Labs on Admission: I have personally reviewed following labs and imaging studies  CBC: Recent Labs  Lab 11/04/19 0942 11/04/19 1416 11/07/19 1337  WBC 4.8 5.7 4.6  NEUTROABS  --  2.9  --   HGB 13.0  13.1 12.8  HCT 42.1 42.4 42.6  MCV 89.6 89.5 91.6  PLT 209 211 [id number]   Basic Metabolic Panel: Recent Labs  Lab 11/04/19 0942 11/07/19 1337  NA 141 140  K 3.9 3.7  CL 101 100  CO2 31 30  GLUCOSE 103* 104*  BUN 14 17  CREATININE 0.82 0.97  CALCIUM 9.4 9.2   GFR: Estimated Creatinine Clearance: 64.2 mL/min (by C-G formula based on SCr of 0.97 mg/dL). Liver Function Tests: Recent Labs  Lab 11/04/19 0942 11/07/19 1337  AST 12* 14*  ALT 12 11  ALKPHOS 90 92  BILITOT 0.4 0.4  PROT 8.0 8.1  ALBUMIN 4.2 4.2   No results for input(s): LIPASE, AMYLASE in the last 168 hours. No results for input(s): AMMONIA in the last 168 hours. Coagulation Profile: No results for input(s):  INR, PROTIME in the last 168 hours. Cardiac Enzymes: No results for input(s): CKTOTAL, CKMB, CKMBINDEX, TROPONINI in the last 168 hours. BNP (last 3 results) No results for input(s): PROBNP in the last 8760 hours. HbA1C: No results for input(s): HGBA1C in the last 72 hours. CBG: No results for input(s): GLUCAP in the last 168 hours. Lipid Profile: No results for input(s): CHOL, HDL, LDLCALC, TRIG, CHOLHDL, LDLDIRECT in the last 72 hours. Thyroid Function Tests: No results for input(s): TSH, T4TOTAL, FREET4, T3FREE, THYROIDAB in the last 72 hours. Anemia Panel: No results for input(s): VITAMINB12, FOLATE, FERRITIN, TIBC, IRON, RETICCTPCT in the last 72 hours. Urine analysis:    Component Value Date/Time   COLORURINE STRAW (A) 09/24/2019 1641   APPEARANCEUR CLEAR (A) 09/24/2019 1641   APPEARANCEUR Clear 04/05/2013 0848   LABSPEC 1.034 (H) 09/24/2019 1641   LABSPEC 1.010 04/05/2013 0848   PHURINE 6.0 09/24/2019 1641   GLUCOSEU NEGATIVE 09/24/2019 1641   GLUCOSEU Negative 04/05/2013 0848   HGBUR SMALL (A) 09/24/2019 1641   BILIRUBINUR NEGATIVE 09/24/2019 1641   BILIRUBINUR neg 04/15/2016 1647   BILIRUBINUR Negative 04/05/2013 0848   KETONESUR NEGATIVE 09/24/2019 1641   PROTEINUR NEGATIVE  09/24/2019 1641   UROBILINOGEN 0.2 04/15/2016 1647   NITRITE NEGATIVE 09/24/2019 1641   LEUKOCYTESUR SMALL (A) 09/24/2019 1641   LEUKOCYTESUR Negative 04/05/2013 0848    Radiological Exams on Admission: No results found.  EKG: None  Assessment/Plan Principal Problem:   Abdominal pain Active Problems:   Hematochezia   Benign essential HTN   OSA on CPAP   GERD (gastroesophageal reflux disease)   Abdominal pain - patient does have extensive diverticulosis but without signs of diverticulitis on CT.  Her episodes wax and wane and can last from seconds to minutes. --Trial of simethicone --Pain control with Tylenol, Norco, IV morphine as needed --Zofran PRN --GI consulted, Dr. Allen Norris notified  Hematochezia -patient has extensive diverticulosis on CT but without signs of diverticulitis.  Most likely diverticular bleeding versus hemorrhoidal.  Hemodynamically stable and hemoglobin above 12 on admission. --GI consulted --repeat CBC in AM --would get H&H overnight if significant bleeding occurs  Benign essential HTN -continue home hydralazine, as needed as well  OSA on CPAP - CPAP ordered nightly  GERD -continue PPI    DVT prophylaxis: SCDs Code Status: Full Family Communication: Daughter at bedside during encounter, updated and all questions answered, agreeable with plan Disposition Plan: Expect discharge home in 24 hours pending GI consult Consults called: GI, Dr. Allen Norris Admission status: obs    Ezekiel Slocumb, DO [facility]   If 7PM-7AM, please contact night-coverage [email or web address]  11/07/2019, 5:52 PM

## 2019-11-08 DIAGNOSIS — R1030 Lower abdominal pain, unspecified: Secondary | ICD-10-CM | POA: Diagnosis not present

## 2019-11-08 DIAGNOSIS — R109 Unspecified abdominal pain: Secondary | ICD-10-CM | POA: Diagnosis not present

## 2019-11-08 LAB — BASIC METABOLIC PANEL
Anion gap: 8 (ref 5–15)
BUN: 20 mg/dL (ref 8–23)
CO2: 29 mmol/L (ref 22–32)
Calcium: 8.8 mg/dL — ABNORMAL LOW (ref 8.9–10.3)
Chloride: 103 mmol/L (ref 98–111)
Creatinine, Ser: 0.94 mg/dL (ref 0.44–1.00)
GFR calc Af Amer: >60 mL/min (ref >60)
GFR calc non Af Amer: >60 mL/min (ref >60)
Glucose, Bld: 99 mg/dL (ref 70–99)
Potassium: 3.9 mmol/L (ref 3.5–5.1)
Sodium: 140 mmol/L (ref 135–145)

## 2019-11-08 LAB — CBC
HCT: 39.4 % (ref 36.0–46.0)
Hemoglobin: 12.1 g/dL (ref 12.0–15.0)
MCH: 28 pg (ref 26.0–34.0)
MCHC: 30.7 g/dL (ref 30.0–36.0)
MCV: 91.2 fL (ref 80.0–100.0)
Platelets: 178 10*3/uL (ref 150–400)
RBC: 4.32 MIL/uL (ref 3.87–5.11)
RDW: 16.5 % — ABNORMAL HIGH (ref 11.5–15.5)
WBC: 4.4 10*3/uL (ref 4.0–10.5)
nRBC: 0 % (ref 0.0–0.2)

## 2019-11-08 MED ORDER — CYCLOBENZAPRINE HCL 10 MG PO TABS: 5.0000 mg | ORAL_TABLET | Freq: Three times a day (TID) | ORAL | Status: DC | PRN

## 2019-11-08 MED ORDER — SIMETHICONE 80 MG PO CHEW: 80.0000 mg | CHEWABLE_TABLET | Freq: Four times a day (QID) | ORAL | 0 refills | Status: DC | PRN

## 2019-11-08 MED ORDER — CYCLOBENZAPRINE HCL 5 MG PO TABS: 5.0000 mg | ORAL_TABLET | Freq: Three times a day (TID) | ORAL | 0 refills | Status: DC | PRN

## 2019-11-08 MED ORDER — DICYCLOMINE HCL 10 MG PO CAPS: 10.0000 mg | ORAL_CAPSULE | Freq: Three times a day (TID) | ORAL | 0 refills | Status: DC

## 2019-11-08 MED ORDER — DICYCLOMINE HCL 10 MG PO CAPS
10.0000 mg | ORAL_CAPSULE | Freq: Three times a day (TID) | ORAL | Status: DC
  Administered 2019-11-08: 15:00:00 10 mg via ORAL
  Filled 2019-11-08 (×3): qty 1

## 2019-11-08 NOTE — Consult Note (Signed)
Taylor Lame, MD [facility]  [address], [place], [place] [address] Phone: [phone] Fax : [phone]  Consultation  Referring Provider:     Dr. Arbutus Ped Primary Care Physician:  [name], MD Primary Gastroenterologist:  Dr. Marius Ditch         Reason for Consultation:     Abdominal pain  Date of Admission:  11/07/2019 Date of Consultation:  11/08/2019         HPI:   Taylor Johnson is a 74 y.o. female who reports abdominal pain for the last few weeks.  The patient states that the abdominal pain required her to come to the hospital ER 3 times in a row.  The patient also reports a small amount of bright red blood per rectum.  The patient is a very poor historian and cannot tell me if the pain is better or worse at different times of the day.  She does state whenever asked a question about the pain is that it feels like bubbling and it gets worse when she is under stress.  She does not say that it is any worse with eating or drinking.  She also reports that it is not made any better or worse with bowel movements.  She has not had a change in bowel habits and denies taking any anti-inflammatory medications.  There is no report of any unexplained weight loss.  The patient had a CT scan of the abdomen with diverticulosis and a duodenal diverticulum seen without any sign of diverticulitis.  The patient's labs were also normal.  She reports that her abdominal pain is crampy in nature and will come out of nowhere and double her over and then it will last for anywhere from a few seconds to a few minutes.  Past Medical History:  Diagnosis Date  . Abdominal pain 01/27/2017  . Anxiety   . Anxiety, generalized 05/11/2015  . Asthma   . Cough 06/02/2016   Chronic - followed by Pulmonary  . Depression   . Developmental delay   . Diverticulitis   . Diverticulosis of colon 05/11/2015  . Dysphagia 10/15/2015   Routine Ba Swallow normal; rec modified barium swallow   . Hypoxia 09/12/2015  .  Leg swelling   . Localized edema 04/15/2016  . Microscopic hematuria 04/15/2016  . Nausea and vomiting 01/29/2017  . Orthostatic hypotension 03/16/2017  . OSA on CPAP 11/18/2015   CPAP @ 18 cm H2O started 02/2016  . Rectal bleeding 10/15/2018  . Urge incontinence of urine 05/12/2015    Past Surgical History:  Procedure Laterality Date  . ABDOMINAL HYSTERECTOMY    . BREAST BIOPSY Left    neg  . COLON SURGERY    . ESOPHAGOGASTRODUODENOSCOPY (EGD) WITH PROPOFOL N/A 01/29/2017   Procedure: ESOPHAGOGASTRODUODENOSCOPY (EGD) WITH PROPOFOL;  Surgeon: Wilford Corner, MD;  Location: [facility] ENDOSCOPY;  Service: Endoscopy;  Laterality: N/A;    Prior to Admission medications   Medication Sig Start Date End Date Taking? Authorizing Provider  aspirin 81 MG EC tablet Take 1 tablet (81 mg total) by mouth daily. 08/09/19  Yes [name], MD  hydrALAZINE (APRESOLINE) 25 MG tablet Take 1 tablet (25 mg total) by mouth 3 (three) times daily. 10/30/19  Yes [name], MD  meclizine (ANTIVERT) 12.5 MG tablet Take 12.5 mg by mouth 3 (three) times daily as needed. 11/01/19  Yes [provider]  metoCLOPramide (REGLAN) 5 MG tablet Take 1 tablet (5 mg total) by mouth 3 (three)  times daily before meals. 08/09/19 11/07/19 Yes [name], MD  pantoprazole (PROTONIX) 40 MG tablet Take 1 tablet (40 mg total) by mouth daily. 07/25/19 11/07/19 Yes Menshew, Dannielle Karvonen, PA-C  vitamin B-12 (CYANOCOBALAMIN) 500 MCG tablet Take 500 mcg by mouth daily.   Yes [provider]  cyclobenzaprine (FLEXERIL) 5 MG tablet Take 1 tablet (5 mg total) by mouth 3 (three) times daily as needed (abdominal muscle spasms). 11/08/19   Ezekiel Slocumb, DO  dicyclomine (BENTYL) 10 MG capsule Take 1 capsule (10 mg total) by mouth 4 (four) times daily -  before meals and at bedtime. 11/08/19   Ezekiel Slocumb, DO  simethicone (MYLICON) 80 MG chewable tablet Chew 1 tablet (80 mg total) by mouth 4 (four) times daily as  needed for flatulence (gas pains). 11/08/19   Ezekiel Slocumb, DO    Family History  Problem Relation Age of Onset  . Hypertension Mother   . Kidney disease Mother   . Dementia Mother   . Cancer Father        lung  . Bladder Cancer Neg Hx   . Breast cancer Neg Hx      Social History   Tobacco Use  . Smoking status: Never Smoker  . Smokeless tobacco: Never Used  . Tobacco comment: smoking cessation materials not required  Substance Use Topics  . Alcohol use: No    Alcohol/week: 0.0 standard drinks  . Drug use: No    Allergies as of 11/07/2019  . (No Known Allergies)    Review of Systems:    All systems reviewed and negative except where noted in HPI.   Physical Exam:  Vital signs in last 24 hours: Temp:  [97.6 F (36.4 C)-98.6 F (37 C)] 97.8 F (36.6 C) (04/23 0849) Pulse Rate:  [51-73] 73 (04/23 0849) Resp:  [14-20] 20 (04/23 0849) BP: (122-171)/(52-101) 161/83 (04/23 0849) SpO2:  [94 %-99 %] 94 % (04/23 0849) Weight:  [109.9 kg] 109.9 kg (04/22 2055)   General:   Pleasant, cooperative in NAD poor historian Head:  Normocephalic and atraumatic. Eyes:   No icterus.   Conjunctiva pink. PERRLA. Ears:  Normal auditory acuity. Neck:  Supple; no masses or thyroidomegaly Lungs: Respirations even and unlabored. Lungs clear to auscultation bilaterally.   No wheezes, crackles, or rhonchi.  Heart:  Regular rate and rhythm;  Without murmur, clicks, rubs or gallops Abdomen:  Soft, nondistended, patient's tenderness to abdominal palpation is intermittent from minute to minute and appears to be worse with flexion of the abdominal wall muscles. Normal bowel sounds. No appreciable masses or hepatomegaly.  No rebound or guarding.  Rectal:  Not performed. Msk:  Symmetrical without gross deformities.    Extremities:  Without edema, cyanosis or clubbing. Neurologic:  Alert and oriented x3;  grossly normal neurologically. Skin:  Intact without significant lesions or  rashes. Cervical Nodes:  No significant cervical adenopathy. Psych:  Alert and cooperative.  Poor historian  LAB RESULTS: Recent Labs    11/07/19 1337 11/08/19 0540  WBC 4.6 4.4  HGB 12.8 12.1  HCT 42.6 39.4  PLT 208 178   BMET Recent Labs    11/07/19 1337 11/08/19 0540  NA 140 140  K 3.7 3.9  CL 100 103  CO2 30 29  GLUCOSE 104* 99  BUN 17 20  CREATININE 0.97 0.94  CALCIUM 9.2 8.8*   LFT Recent Labs    11/07/19 1337  PROT 8.1  ALBUMIN 4.2  AST 14*  ALT 11  ALKPHOS 92  BILITOT 0.4   PT/INR No results for input(s): LABPROT, INR in the last 72 hours.  STUDIES: No results found.    Impression / Plan:   Assessment: Principal Problem:   Abdominal pain Active Problems:   OSA on CPAP   Benign essential HTN   Hematochezia   GERD (gastroesophageal reflux disease)   Taylor Johnson is a 74 y.o. y/o female with abdominal pain that has been intermittent for the last few weeks.  The patient has been in the ER 3 times.  The patient's work-up has been normal.  The patient is a poor historian and is hard to characterize the pain except that she states it feels like bubbling and that when she gets angry it gets worse.  She also has had some rectal bleeding with a stable hemoglobin and hematocrit.  The patient's last colonoscopy was in 2015 and she had a EGD in 2018.  There was no CT findings to explain her abdominal pain.  Plan:  It appears that the abdominal pain may be musculoskeletal in nature since it is not related to eating or drinking and is spasmodic.  She also states he gets worse sometimes with movement.  I would consider treating this with Flexeril.  As for the patient's hematochezia with her stable hemoglobin I would recommend the patient follow with Dr. Marius Ditch as an outpatient for her rectal bleeding and abdominal pain.   Thank you for involving me in the care of this patient.      LOS: 0 days   Taylor Lame, MD  11/08/2019, 1:59 PM Pager [phone]  7am-5pm  Check AMION for 5pm -7am coverage and on weekends   Note: This dictation was prepared with Dragon dictation along with smaller phrase technology. Any transcriptional errors that result from this process are unintentional.

## 2019-11-08 NOTE — Care Management Obs Status (Signed)
[facility] NOTIFICATION   Patient Details  Name: ROSLAND PILCHER MRN: [id number] Date of Birth: [birth year]   Medicare Observation Status Notification Given:  Yes    Shelbie Hutching, RN 11/08/2019, 12:44 PM

## 2019-11-08 NOTE — Discharge Summary (Signed)
Physician Discharge Summary  Taylor Johnson [id number] DOB: 08-14-1945 DOA: 11/07/2019  PCP: [name], MD  Admit date: 11/07/2019 Discharge date: 11/08/2019  Admitted From: home Disposition:  home  Recommendations for Outpatient Follow-up:  1. Follow up with PCP in 1-2 weeks 2. Please obtain BMP/CBC in one week 3. Please follow up with GI in 2-3 weeks  Home Health: no Equipment/Devices: none   Discharge Condition: stable  CODE STATUS: full  Diet recommendation: heart healthy  Brief/Interim Summary:   Taylor Johnson is a 74 y.o. female with medical history significant of hypertension, GERD, anxiety, OSA on CPAP, morbid obesity who presented to the ED today with ongoing abdominal pain and bright red blood per rectum.  This was patient's third visit to the ED this month for same complaints.  She was to follow-up outpatient with GI, but has not yet gotten in with them.  Regarding the bleeding, patient reports intermittently noticing blood with bowel movements and also in her depend.  Regarding her abdominal pain, she points to her middle and lower left/mid abdomen, describes as sharp and sometimes cramping, episodes sometimes last for seconds to minutes and then resolve.  She will have waxing waning episodes like this at least a few months.  She denies nausea or vomiting diarrhea, fevers or chills.  She reports a good appetite.  She thinks abdominal pain is sometimes worse after eating.  She is found no alleviating factors at home.  In the ED, hypertensive with otherwise stable vitals.  CBC was remarkable only for RDW 16.5.  CMP was notable for glucose 104, AST 14.  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  Given patient's multiple visits to the ED for these complaints, patient is  admitted for observation and GI consult.  Trial of simethicone did not seem to change patient's pain.  GI evaluated patient and suspect she is having abdominal wall spasms, suggested trial of Flexeril.  Patient was discharged on trial of Flexeril as well as Bentyl.  Hemoglobin stable and no bleeding during admission, patient to follow up with GI outpatient, already has appointment scheduled.    Discharge Diagnoses: Principal Problem:   Abdominal pain Active Problems:   Hematochezia   Benign essential HTN   OSA on CPAP   GERD (gastroesophageal reflux disease)    Discharge Instructions   Discharge Instructions    Call MD for:   Complete by: As directed    Unable to keep down food or drinks   Call MD for:  persistant nausea and vomiting   Complete by: As directed    Call MD for:  temperature >100.4   Complete by: As directed    Diet - low sodium heart healthy   Complete by: As directed    Discharge instructions   Complete by: As directed    We will try some muscle relaxer medications for your abdominal pain.  It seems like this could be all muscle spasms. Please pay close attention to any improvement in your symptoms with these medications.   See GI in follow up within next few weeks if possible.  Your blood counts have remained stable, so any bleeding you've had has been minimal, and not a dangerous amount.  If you continue to have bleeding, please bring up at GI appointment or with primary care.  Increase activity slowly   Complete by: As directed      Allergies as of 11/08/2019   No Known Allergies     Medication List    STOP taking these medications   aspirin 81 MG EC tablet     TAKE these medications   cyclobenzaprine 5 MG tablet Commonly known as: FLEXERIL Take 1 tablet (5 mg total) by mouth 3 (three) times daily as needed (abdominal muscle spasms).   dicyclomine 10 MG capsule Commonly known as: BENTYL Take 1 capsule (10 mg total) by mouth 4 (four) times  daily -  before meals and at bedtime.   hydrALAZINE 25 MG tablet Commonly known as: APRESOLINE Take 1 tablet (25 mg total) by mouth 3 (three) times daily.   meclizine 12.5 MG tablet Commonly known as: ANTIVERT Take 12.5 mg by mouth 3 (three) times daily as needed.   metoCLOPramide 5 MG tablet Commonly known as: REGLAN Take 1 tablet (5 mg total) by mouth 3 (three) times daily before meals.   pantoprazole 40 MG tablet Commonly known as: Protonix Take 1 tablet (40 mg total) by mouth daily.   simethicone 80 MG chewable tablet Commonly known as: MYLICON Chew 1 tablet (80 mg total) by mouth 4 (four) times daily as needed for flatulence (gas pains).   vitamin B-12 500 MCG tablet Commonly known as: CYANOCOBALAMIN Take 500 mcg by mouth daily.       No Known Allergies  Consultations:  Gastroenterology    Procedures/Studies: CT ABDOMEN PELVIS W CONTRAST  Result Date: 11/04/2019 CLINICAL DATA:  Hematochezia. History of diverticulosis. Clinical concern for diverticulitis. EXAM: CT ABDOMEN AND PELVIS WITH CONTRAST TECHNIQUE: Multidetector CT imaging of the abdomen and pelvis was performed using the standard protocol following bolus administration of intravenous contrast. CONTRAST:  130mL OMNIPAQUE IOHEXOL 300 MG/ML  SOLN COMPARISON:  09/24/2019 FINDINGS: Lower chest: Small pericardial effusion without significant change. This measures 1.4 cm in maximum thickness. Stable enlargement of the heart. Mild bibasilar bullous changes. Stable linear scarring at the left lung base. Hepatobiliary: No focal liver abnormality is seen. No gallstones, gallbladder wall thickening, or biliary dilatation. Pancreas: Unremarkable. No pancreatic ductal dilatation or surrounding inflammatory changes. Spleen: Normal in size without focal abnormality. Adrenals/Urinary Tract: Adrenal glands are unremarkable. Kidneys are normal, without renal calculi, focal lesion, or hydronephrosis. Bladder is unremarkable.  Stomach/Bowel: Very large number of colonic diverticula without evidence of diverticulitis. No mass seen. Normal appearing appendix and stomach. 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 Vascular/Lymphatic: No significant vascular findings are present. No enlarged abdominal or pelvic lymph nodes. Reproductive: Stable densely calcified uterine fibroids. Normal appearing ovaries. Other: Midline surgical scar. No hernia or free peritoneal fluid. Musculoskeletal: Lower thoracic spine degenerative changes and mild lumbar spine degenerative changes. IMPRESSION: 1. No acute abnormality. 2. Very large number of colonic diverticula without evidence of diverticulitis. 3. 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 4. Stable small pericardial effusion. 5. Mild changes of COPD. Electronically Signed   By: Claudie Revering M.D.   On: 11/04/2019 15:27       Subjective: Patient reports still having spells of abdominal pain, most last seconds.  She does notice much difference with simethicone, "maybe a little" she reported.  No fever chills, nausea vomiting, bleeding or other complaints.   Discharge Exam: Vitals:   11/08/19 [id number] 11/08/19 [id number]  BP: (!) 122/52 (!) 161/83  Pulse: (!) 58 73  Resp: 16 20  Temp: 97.6 F (36.4 C) 97.8 F (36.6 C)  SpO2: 97% 94%   Vitals:   11/07/19 2055 11/07/19 2133 11/08/19 0038 11/08/19 0849  BP:   (!) 122/52 (!) 161/83  Pulse:   (!) 58 73  Resp:   16 20  Temp:   97.6 F (36.4 C) 97.8 F (36.6 C)  TempSrc:   Axillary Oral  SpO2:  96% 97% 94%  Weight: 109.9 kg     Height: 5\' 6"  (1.676 m)       General: Pt is alert, awake, not in acute distress Cardiovascular: RRR, S1/S2 +, no rubs, no gallops Respiratory: CTA bilaterally, no wheezing, no rhonchi Abdominal: Soft, tender on light/superficial  palpation with voluntary guarding, no rebound tenderness, no distention, hyperactive bowel sounds + Extremities: no edema, no cyanosis    The results of significant diagnostics from this hospitalization (including imaging, microbiology, ancillary and laboratory) are listed below for reference.     Microbiology: Recent Results (from the past 240 hour(s))  SARS CORONAVIRUS 2 (TAT 6-24 HRS) Nasopharyngeal Nasopharyngeal Swab     Status: None   Collection Time: 11/07/19  5:00 PM   Specimen: Nasopharyngeal Swab  Result Value Ref Range Status   SARS Coronavirus 2 NEGATIVE NEGATIVE Final    Comment: (NOTE) SARS-CoV-2 target nucleic acids are NOT DETECTED. The SARS-CoV-2 RNA is generally detectable in upper and lower respiratory specimens during the acute phase of infection. Negative results do not preclude SARS-CoV-2 infection, do not rule out co-infections with other pathogens, and should not be used as the sole basis for treatment or other patient management decisions. Negative results must be combined with clinical observations, patient history, and epidemiological information. The expected result is Negative. Fact Sheet for Patients: SugarRoll.be Fact Sheet for Healthcare Providers: https://www.woods-mathews.com/ This test is not yet approved or cleared by the [place] FDA and  has been authorized for detection and/or diagnosis of SARS-CoV-2 by FDA under an Emergency Use Authorization (EUA). This EUA will remain  in effect (meaning this test can be used) for the duration of the COVID-19 declaration under Section 56 4(b)(1) of the Act, 21 U.S.C. section 360bbb-3(b)(1), unless the authorization is terminated or revoked sooner. Performed at [facility], [place] [address], [place], [place] [address]      Labs: BNP (last 3 results) No results for input(s): BNP in the last 8760 hours. Basic Metabolic Panel: Recent Labs  Lab  11/04/19 0942 11/07/19 1337 11/08/19 0540  NA 141 140 140  K 3.9 3.7 3.9  CL 101 100 103  CO2 31 30 29   GLUCOSE 103* 104* 99  BUN 14 17 20   CREATININE 0.82 0.97 0.94  CALCIUM 9.4 9.2 8.8*   Liver Function Tests: Recent Labs  Lab 11/04/19 0942 11/07/19 1337  AST 12* 14*  ALT 12 11  ALKPHOS 90 92  BILITOT 0.4 0.4  PROT 8.0 8.1  ALBUMIN 4.2 4.2   No results for input(s): LIPASE, AMYLASE in the last 168 hours. No results for input(s): AMMONIA in the last 168 hours. CBC: Recent Labs  Lab 11/04/19 0942 11/04/19 1416 11/07/19 1337 11/08/19 0540  WBC 4.8 5.7 4.6 4.4  NEUTROABS  --  2.9  --   --   HGB 13.0 13.1 12.8 12.1  HCT 42.1 42.4 42.6 39.4  MCV 89.6 89.5 91.6 91.2  PLT 209 211 208 178   Cardiac Enzymes: No results for  input(s): CKTOTAL, CKMB, CKMBINDEX, TROPONINI in the last 168 hours. BNP: Invalid input(s): POCBNP CBG: No results for input(s): GLUCAP in the last 168 hours. D-Dimer No results for input(s): DDIMER in the last 72 hours. Hgb A1c No results for input(s): HGBA1C in the last 72 hours. Lipid Profile No results for input(s): CHOL, HDL, LDLCALC, TRIG, CHOLHDL, LDLDIRECT in the last 72 hours. Thyroid function studies No results for input(s): TSH, T4TOTAL, T3FREE, THYROIDAB in the last 72 hours.  Invalid input(s): FREET3 Anemia work up No results for input(s): VITAMINB12, FOLATE, FERRITIN, TIBC, IRON, RETICCTPCT in the last 72 hours. Urinalysis    Component Value Date/Time   COLORURINE STRAW (A) 09/24/2019 1641   APPEARANCEUR CLEAR (A) 09/24/2019 1641   APPEARANCEUR Clear 04/05/2013 0848   LABSPEC 1.034 (H) 09/24/2019 1641   LABSPEC 1.010 04/05/2013 0848   PHURINE 6.0 09/24/2019 1641   GLUCOSEU NEGATIVE 09/24/2019 1641   GLUCOSEU Negative 04/05/2013 0848   HGBUR SMALL (A) 09/24/2019 1641   BILIRUBINUR NEGATIVE 09/24/2019 1641   BILIRUBINUR neg 04/15/2016 1647   BILIRUBINUR Negative 04/05/2013 0848   KETONESUR NEGATIVE 09/24/2019 1641    PROTEINUR NEGATIVE 09/24/2019 1641   UROBILINOGEN 0.2 04/15/2016 1647   NITRITE NEGATIVE 09/24/2019 1641   LEUKOCYTESUR SMALL (A) 09/24/2019 1641   LEUKOCYTESUR Negative 04/05/2013 0848   Sepsis Labs Invalid input(s): PROCALCITONIN,  WBC,  LACTICIDVEN Microbiology Recent Results (from the past 240 hour(s))  SARS CORONAVIRUS 2 (TAT 6-24 HRS) Nasopharyngeal Nasopharyngeal Swab     Status: None   Collection Time: 11/07/19  5:00 PM   Specimen: Nasopharyngeal Swab  Result Value Ref Range Status   SARS Coronavirus 2 NEGATIVE NEGATIVE Final    Comment: (NOTE) SARS-CoV-2 target nucleic acids are NOT DETECTED. The SARS-CoV-2 RNA is generally detectable in upper and lower respiratory specimens during the acute phase of infection. Negative results do not preclude SARS-CoV-2 infection, do not rule out co-infections with other pathogens, and should not be used as the sole basis for treatment or other patient management decisions. Negative results must be combined with clinical observations, patient history, and epidemiological information. The expected result is Negative. Fact Sheet for Patients: SugarRoll.be Fact Sheet for Healthcare Providers: https://www.woods-mathews.com/ This test is not yet approved or cleared by the [place] FDA and  has been authorized for detection and/or diagnosis of SARS-CoV-2 by FDA under an Emergency Use Authorization (EUA). This EUA will remain  in effect (meaning this test can be used) for the duration of the COVID-19 declaration under Section 56 4(b)(1) of the Act, 21 U.S.C. section 360bbb-3(b)(1), unless the authorization is terminated or revoked sooner. Performed at [facility], [place] [address] [place], [place] [address]      Time coordinating discharge: Over 30 minutes  SIGNED:   Ezekiel Slocumb, DO [facility] 11/08/2019, 1:19 PM   If 7PM-7AM, please contact  night-coverage [email or web address]

## 2019-11-08 NOTE — TOC Initial Note (Signed)
Transition of Care [facility]) - Initial/Assessment Note    Patient Details  Name: Taylor Johnson MRN: [id number] Date of Birth: 1945/09/24  [facility] of [facility]) CM/SW Contact:    Shelbie Hutching, RN Phone Number: 11/08/2019, 2:45 PM  Clinical Narrative:                 Patient placed under observation for abdominal pain and rectal bleeding.  Patient is medically cleared for discharge today.  Patient's niece will come and pick her up.  No needs identified.   Expected Discharge Plan: Home/Self Care Barriers to Discharge: Barriers Resolved   Patient Goals and CMS Choice        Expected Discharge Plan and Services Expected Discharge Plan: Home/Self Care         Expected Discharge Date: 11/08/19                                    Prior Living Arrangements/Services   Lives with:: Parents(lives with mother who is 72)              Current home services: (has a cane)    Activities of Daily Living Home Assistive Devices/Equipment: Cane (specify quad or straight) ADL Screening (condition at time of admission) Patient's cognitive ability adequate to safely complete daily activities?: Yes Is the patient deaf or have difficulty hearing?: No Does the patient have difficulty seeing, even when wearing glasses/contacts?: No Does the patient have difficulty concentrating, remembering, or making decisions?: No Patient able to express need for assistance with ADLs?: Yes Does the patient have difficulty dressing or bathing?: No Independently performs ADLs?: Yes (appropriate for developmental age) Does the patient have difficulty walking or climbing stairs?: No Weakness of Legs: None Weakness of Arms/Hands: None  Permission Sought/Granted                  Emotional Assessment Appearance:: Appears stated age Attitude/Demeanor/Rapport: Guarded, Avoidant Affect (typically observed): Quiet, Flat Orientation: : Oriented to Self, Oriented to Place, Oriented to  Time,  Oriented to Situation Alcohol / Substance Use: Not Applicable Psych Involvement: No (comment)  Admission diagnosis:  Diverticulosis [K57.90] Bright red blood per rectum [K62.5] Abdominal pain [R10.9] Patient Active Problem List   Diagnosis Date Noted  . Abdominal pain 11/07/2019  . Hematochezia 11/07/2019  . GERD (gastroesophageal reflux disease) 11/07/2019  . Dizziness 02/04/2019  . Benign essential HTN 10/23/2018  . Functional systolic murmur 0000000  . Developmental delay disorder 02/06/2017  . Calculus of gallbladder without cholecystitis without obstruction 01/20/2017  . Cough 06/02/2016  . Localized edema 04/15/2016  . Microscopic hematuria 04/15/2016  . OSA on CPAP 11/18/2015  . Dysphagia 10/15/2015  . Urge incontinence of urine 05/12/2015  . Diverticulosis of colon 05/11/2015  . Anxiety, generalized 05/11/2015   PCP:  [name], MD Pharmacy:   [facility] [place], [place], [place] [address] [place] [address] Phone: [phone] Fax: [place], [place] - [address] [place] [address] Phone: [phone] Fax: [phone]     Social Determinants of Health (SDOH) Interventions    Readmission Risk Interventions No flowsheet data found.

## 2019-11-15 ENCOUNTER — Encounter: Payer: Self-pay | Admitting: Gastroenterology

## 2019-11-15 ENCOUNTER — Other Ambulatory Visit: Payer: Self-pay

## 2019-11-15 ENCOUNTER — Ambulatory Visit (INDEPENDENT_AMBULATORY_CARE_PROVIDER_SITE_OTHER): Payer: Medicare Other | Admitting: Gastroenterology

## 2019-11-15 VITALS — BP 159/91 | HR 80 | Temp 98.2°F | Wt 234.4 lb

## 2019-11-15 DIAGNOSIS — N939 Abnormal uterine and vaginal bleeding, unspecified: Secondary | ICD-10-CM

## 2019-11-15 DIAGNOSIS — K625 Hemorrhage of anus and rectum: Secondary | ICD-10-CM

## 2019-11-15 DIAGNOSIS — R1084 Generalized abdominal pain: Secondary | ICD-10-CM | POA: Diagnosis not present

## 2019-11-15 MED ORDER — DICYCLOMINE HCL 10 MG PO CAPS: 20.0000 mg | ORAL_CAPSULE | Freq: Three times a day (TID) | ORAL | 0 refills | Status: DC

## 2019-11-15 MED ORDER — NA SULFATE-K SULFATE-MG SULF 17.5-3.13-1.6 GM/177ML PO SOLN: 354.0000 mL | Freq: Once | ORAL | 0 refills | Status: AC

## 2019-11-15 NOTE — Progress Notes (Signed)
[name], MD [address]  [place]  [place], [place] [address]  Main: [phone]  Fax: [phone]    Gastroenterology Consultation  Referring Provider:     [name], MD Primary Care Physician:  [name], MD Primary Gastroenterologist:  Dr. [name] Reason for Consultation: Abdominal pain, rectal bleeding        HPI:   Taylor Johnson is a 74 y.o. y/o female referred by Dr. Army Melia, Jesse Sans, MD  for consultation & management of Abdominal pain and difficulty swallowing. She has known history of severe pandiverticulosis and was recently admitted to [facility] about a month ago with recurrent nausea and vomiting, abdominal pain, CT angiogram of the abdomen did not reveal any bleeding source. There was no evidence of diverticulitis. She was seen by GI, underwent EGD and it was unremarkable. She had a normal barium swallow in March 2017. She was discharged to rehabilitation and subsequently home now for. Per her sister, she has lost about 30 pounds in last 2 months due to nausea, vomiting, abdominal pain and difficulty swallowing solid foods. She is also deconditioned since her discharge and is currently undergoing PT, patient thinks her energy levels are coming back up slowly. She is currently eating only mashed foods. Her sister reports that she has been coughing when she tries to eat solid food, and she feels like food is stuck in her throat. She denies upper abdominal pain, heartburn. She is not having episodes of nausea and vomiting anymore.  With regards to her abdominal pain, patient reports that it is worse at night. She is having one to 2 soft bowel movements daily. She has severe pandiverticulosis based on an endoscopy and abdominal imaging. Denies any blood in the stool. Per her sister, she had several episodes of diverticulitis in the past as well as diverticular bleeding needing blood transfusions.  Follow-up phone visit  12/03/2018 Patient has been doing well.  Patient's sister provided the history.  Patient lives with her sister.  She does not have any concerns today.  Work-up of anemia revealed improving hemoglobin and normal B12, folate and iron panel.  H. pylori breath test returned positive.  She is taking omeprazole 40 mg once a day at present.  She denies any difficulty swallowing.  She denies any other GI symptoms at this time.  Follow-up phone visit 12/24/2018 Taylor Johnson had episode of rectal bleeding last week and her sister was very concerned about it.  She described it as bright red blood per rectum.  Repeat CBC revealed normal hemoglobin.  Patient's sister reported that rectal bleeding subsided and she does not have any abdominal symptoms.  Patient is about to finish triple therapy for H. pylori infection  Follow-up visit 08/05/2019 Taylor Johnson went to [facility] ER on 07/25/2019 secondary to abdominal pain that started after eating pork meat fat.  She underwent CT abdomen and pelvis which was unremarkable except for colonic diverticulosis.  CBC, lipase and CMP were normal.  Patient's daughter reports that she eats all day long, does eat fried foods every day.  Patient reports having bowel movements every day.  She describes the pain below umbilicus without any radiation.  Patient denies rectal bleeding, nausea vomiting, abdominal bloating  Follow-up visit 11/15/2019 Patient was recently admitted to [facility] secondary to generalized abdominal pain and hematochezia with no evidence of acute anemia.  Patient reports that she has been noticing blood in her depends.  She is not clear  if she has rectal bleeding or vaginal bleeding or blood in her urine.  She reports that her bowel movements have been regular.  She also reports generalized abdominal pain which has been chronic.  She was recommended to try Flexeril during recent admission.  She was on Bentyl in the past.  Her weight has been stable.  GI Procedures: EGD 01/2017  normal. Colonoscopy 04/2014 showed diverticulosis. Complete report not available and patient reports that it was done at [facility]  Past Medical History:  Diagnosis Date  . Abdominal pain 01/27/2017  . Anxiety   . Anxiety, generalized 05/11/2015  . Asthma   . Cough 06/02/2016   Chronic - followed by Pulmonary  . Depression   . Developmental delay   . Diverticulitis   . Diverticulosis of colon 05/11/2015  . Dysphagia 10/15/2015   Routine Ba Swallow normal; rec modified barium swallow   . Hypoxia 09/12/2015  . Leg swelling   . Localized edema 04/15/2016  . Microscopic hematuria 04/15/2016  . Nausea and vomiting 01/29/2017  . Orthostatic hypotension 03/16/2017  . OSA on CPAP 11/18/2015   CPAP @ 18 cm H2O started 02/2016  . Rectal bleeding 10/15/2018  . Urge incontinence of urine 05/12/2015    Past Surgical History:  Procedure Laterality Date  . ABDOMINAL HYSTERECTOMY    . BREAST BIOPSY Left    neg  . COLON SURGERY    . ESOPHAGOGASTRODUODENOSCOPY (EGD) WITH PROPOFOL N/A 01/29/2017   Procedure: ESOPHAGOGASTRODUODENOSCOPY (EGD) WITH PROPOFOL;  Surgeon: Wilford Corner, MD;  Location[facility]COPY;  Service: Endoscopy;  Laterality: N/A;    Current Outpatient Medications:  .  cyclobenzaprine (FLEXERIL) 5 MG tablet, Take 1 tablet (5 mg total) by mouth 3 (three) times daily as needed (abdominal muscle spasms)., Disp: 30 tablet, Rfl: 0 .  dicyclomine (BENTYL) 10 MG capsule, Take 2 capsules (20 mg total) by mouth 4 (four) times daily -  before meals and at bedtime., Disp: 120 capsule, Rfl: 0 .  meclizine (ANTIVERT) 12.5 MG tablet, Take 12.5 mg by mouth 3 (three) times daily as needed., Disp: , Rfl:  .  simethicone (MYLICON) 80 MG chewable tablet, Chew 1 tablet (80 mg total) by mouth 4 (four) times daily as needed for flatulence (gas pains)., Disp: 30 tablet, Rfl: 0 .  hydrALAZINE (APRESOLINE) 25 MG tablet, Take 1 tablet (25 mg total) by mouth 3 (three) times daily. (Patient not  taking: Reported on 11/15/2019), Disp: 90 tablet, Rfl: 5 .  metoCLOPramide (REGLAN) 5 MG tablet, Take 1 tablet (5 mg total) by mouth 3 (three) times daily before meals. (Patient not taking: Reported on 11/15/2019), Disp: 90 tablet, Rfl: 5 .  Na Sulfate-K Sulfate-Mg Sulf 17.5-3.13-1.6 GM/177ML SOLN, Take 354 mLs by mouth once for 1 dose., Disp: 354 mL, Rfl: 0 .  pantoprazole (PROTONIX) 40 MG tablet, Take 1 tablet (40 mg total) by mouth daily. (Patient not taking: Reported on 11/15/2019), Disp: 30 tablet, Rfl: 0 .  vitamin B-12 (CYANOCOBALAMIN) 500 MCG tablet, Take 500 mcg by mouth daily., Disp: , Rfl:    Family History  Problem Relation Age of Onset  . Hypertension Mother   . Kidney disease Mother   . Dementia Mother   . Cancer Father        lung  . Bladder Cancer Neg Hx   . Breast cancer Neg Hx      Social History   Tobacco Use  . Smoking status: Never Smoker  . Smokeless tobacco: Never Used  . Tobacco  comment: smoking cessation materials not required  Substance Use Topics  . Alcohol use: No    Alcohol/week: 0.0 standard drinks  . Drug use: No    Allergies as of 11/15/2019  . (No Known Allergies)    Review of Systems:    All systems reviewed and negative except where noted in HPI.   Physical Exam:  BP (!) 159/91 (BP Location: Left Arm, Patient Position: Sitting, Cuff Size: Normal)   Pulse 80   Temp 98.2 F (36.8 C) (Oral)   Wt 234 lb 6 oz (106.3 kg)   BMI 37.83 kg/m  No LMP recorded. Patient has had a hysterectomy.  General:   Alert,  Sitting in wheelchair, Well-developed, well-nourished, pleasant and cooperative in NAD Head:  Normocephalic and atraumatic. Eyes:  Sclera clear, no icterus.   Conjunctiva pink. Ears:  Normal auditory acuity. Lungs:  Respirations even and unlabored.  Clear throughout to auscultation.   No wheezes, crackles, or rhonchi. No acute distress. Heart:  Regular rate and rhythm; no murmurs, clicks, rubs, or gallops. Abdomen:  Normal bowel sounds.  Obese Soft, non-tender and non-distended without masses, no appreciable hepatosplenomegaly or hernias noted.  No guarding or rebound tenderness.   Rectal: Nor performed Msk:  Symmetrical without gross deformities. Good, equal movement & strength bilaterally. Pulses:  Normal pulses noted. Extremities:  No clubbing or edema.  No cyanosis. Neurologic:  Alert and oriented x3; Skin:  Intact without significant lesions or rashes. No jaundice. Psych:  Alert and cooperative. Normal mood and affect.  Imaging Studies: Reviewed  Assessment and Plan:   Taylor Johnson is a 74 y.o. 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  Patient had a colonoscopy 2015 which revealed diverticulosis only.  CT abdomen pelvis was unremarkable.  Given that there is a concern for hematochezia, I recommend colonoscopy for further evaluation.  I will also refer her to OB/GYN for evaluation of any GYN source.  Her recent UA revealed few RBCs only. If above work-up is unremarkable, I will consider hemorrhoid ligation. Suggested her to try Bentyl 20 mg as needed up to 4 times a day for abdominal pain in the interim  Follow up after above work-up   [name], MD

## 2019-12-02 ENCOUNTER — Other Ambulatory Visit (HOSPITAL_COMMUNITY)
Admission: RE | Admit: 2019-12-02 | Discharge: 2019-12-02 | Disposition: A | Discharge status: Home / Self Care | Payer: Medicare Other | Source: Ambulatory Visit | Attending: Certified Nurse Midwife | Admitting: Certified Nurse Midwife

## 2019-12-02 ENCOUNTER — Ambulatory Visit (INDEPENDENT_AMBULATORY_CARE_PROVIDER_SITE_OTHER): Payer: Medicare Other | Admitting: Certified Nurse Midwife

## 2019-12-02 ENCOUNTER — Encounter: Payer: Self-pay | Admitting: Certified Nurse Midwife

## 2019-12-02 ENCOUNTER — Telehealth: Payer: Self-pay | Admitting: Certified Nurse Midwife

## 2019-12-02 ENCOUNTER — Other Ambulatory Visit: Payer: Self-pay

## 2019-12-02 VITALS — BP 145/72 | HR 78 | Ht 64.0 in | Wt 228.7 lb

## 2019-12-02 DIAGNOSIS — N950 Postmenopausal bleeding: Secondary | ICD-10-CM

## 2019-12-02 DIAGNOSIS — L729 Follicular cyst of the skin and subcutaneous tissue, unspecified: Secondary | ICD-10-CM

## 2019-12-02 DIAGNOSIS — D259 Leiomyoma of uterus, unspecified: Secondary | ICD-10-CM | POA: Diagnosis not present

## 2019-12-02 DIAGNOSIS — R1084 Generalized abdominal pain: Secondary | ICD-10-CM

## 2019-12-02 MED ORDER — SULFAMETHOXAZOLE-TRIMETHOPRIM 800-160 MG PO TABS
1.0000 | ORAL_TABLET | Freq: Two times a day (BID) | ORAL | 0 refills | Status: DC
Start: 2019-12-02 — End: 2020-01-16

## 2019-12-02 NOTE — Progress Notes (Signed)
GYN ENCOUNTER NOTE  Subjective:       Taylor Johnson is a 74 y.o. G0P0000 female is here for gynecologic evaluation of the following issues:  1. Postmenopausal bleeding 2. Abdominal pain  Reports constant abdominal pain and light vaginal bleeding for the last month, accompanied by approximately 30 pound weight loss.   Wears pad daily, changes every bathroom visit.   Seen in ER for symptoms. Scheduled for colonoscopy next week.   Lives with sister, who is with her at today's appointment.   Reports having a hysterectomy "a long time ago".   Denies difficulty breathing or respiratory distress, chest pain, dysuria, and leg swelling.    Gynecologic History  No LMP recorded. Patient has had a hysterectomy.  Contraception: status post hysterectomy  Last Pap: Unknown  Last mammogram: 05/2019. Results were: BI-RADS 1  Last colonoscopy: 04/21/2014. Results were: Normal  Obstetric History  OB History  Gravida Para Term Preterm AB Living  0 0 0 0 0 0  SAB TAB Ectopic Multiple Live Births  0 0 0 0      Past Medical History:  Diagnosis Date  . Abdominal pain 01/27/2017  . Anxiety   . Anxiety, generalized 05/11/2015  . Asthma   . Cough 06/02/2016   Chronic - followed by Pulmonary  . Depression   . Developmental delay   . Diverticulitis   . Diverticulosis of colon 05/11/2015  . Dysphagia 10/15/2015   Routine Ba Swallow normal; rec modified barium swallow   . Hypoxia 09/12/2015  . Leg swelling   . Localized edema 04/15/2016  . Microscopic hematuria 04/15/2016  . Nausea and vomiting 01/29/2017  . Orthostatic hypotension 03/16/2017  . OSA on CPAP 11/18/2015   CPAP @ 18 cm H2O started 02/2016  . Rectal bleeding 10/15/2018  . Urge incontinence of urine 05/12/2015    Past Surgical History:  Procedure Laterality Date  . ABDOMINAL HYSTERECTOMY    . BREAST BIOPSY Left    neg  . COLON SURGERY    . ESOPHAGOGASTRODUODENOSCOPY (EGD) WITH PROPOFOL N/A 01/29/2017   Procedure:  ESOPHAGOGASTRODUODENOSCOPY (EGD) WITH PROPOFOL;  Surgeon: Wilford Corner, MD;  Location: [facility] ENDOSCOPY;  Service: Endoscopy;  Laterality: N/A;    Current Outpatient Medications on File Prior to Visit  Medication Sig Dispense Refill  . cyclobenzaprine (FLEXERIL) 5 MG tablet Take 1 tablet (5 mg total) by mouth 3 (three) times daily as needed (abdominal muscle spasms). 30 tablet 0  . dicyclomine (BENTYL) 10 MG capsule Take 2 capsules (20 mg total) by mouth 4 (four) times daily -  before meals and at bedtime. 120 capsule 0  . hydrALAZINE (APRESOLINE) 25 MG tablet Take 1 tablet (25 mg total) by mouth 3 (three) times daily. 90 tablet 5  . meclizine (ANTIVERT) 12.5 MG tablet Take 12.5 mg by mouth 3 (three) times daily as needed.    . simethicone (MYLICON) 80 MG chewable tablet Chew 1 tablet (80 mg total) by mouth 4 (four) times daily as needed for flatulence (gas pains). 30 tablet 0  . vitamin B-12 (CYANOCOBALAMIN) 500 MCG tablet Take 500 mcg by mouth daily.    . metoCLOPramide (REGLAN) 5 MG tablet Take 1 tablet (5 mg total) by mouth 3 (three) times daily before meals. (Patient not taking: Reported on 11/15/2019) 90 tablet 5  . pantoprazole (PROTONIX) 40 MG tablet Take 1 tablet (40 mg total) by mouth daily. (Patient not taking: Reported on 11/15/2019) 30 tablet 0   No current facility-administered medications on file prior to  visit.    No Known Allergies  Social History   Socioeconomic History  . Marital status: Single    Spouse name: Not on file  . Number of children: 0  . Years of education: Not on file  . Highest education level: 5th grade  Occupational History    Employer: retired  Tobacco Use  . Smoking status: Never Smoker  . Smokeless tobacco: Never Used  . Tobacco comment: smoking cessation materials not required  Substance and Sexual Activity  . Alcohol use: No    Alcohol/week: 0.0 standard drinks  . Drug use: No  . Sexual activity: Never    Birth control/protection:  Post-menopausal  Other Topics Concern  . Not on file  Social History Narrative  . Not on file   Social Determinants of Health   Financial Resource Strain: Low Risk   . Difficulty of Paying Living Expenses: Not very hard  Food Insecurity:   . Worried About Charity fundraiser in the Last Year:   . Arboriculturist in the Last Year:   Transportation Needs:   . Film/video editor (Medical):   [name] Lack of Transportation (Non-Medical):   Physical Activity:   . Days of Exercise per Week:   . Minutes of Exercise per Session:   Stress:   . Feeling of Stress :   Social Connections: Unknown  . Frequency of Communication with Friends and Family: Patient refused  . Frequency of Social Gatherings with Friends and Family: Patient refused  . Attends Religious Services: Patient refused  . Active Member of Clubs or Organizations: Patient refused  . Attends Archivist Meetings: Patient refused  . Marital Status: Never married  Intimate Partner Violence: Not At Risk  . Fear of Current or Ex-Partner: No  . Emotionally Abused: No  . Physically Abused: No  . Sexually Abused: No    Family History  Problem Relation Age of Onset  . Hypertension Mother   . Kidney disease Mother   . Dementia Mother   . Cancer Father        lung  . Bladder Cancer Neg Hx   . Breast cancer Neg Hx     The following portions of the patient's history were reviewed and updated as appropriate: allergies, current medications, past family history, past medical history, past social history, past surgical history and problem list.  Review of Systems  ROS negative except as noted above. Information obtained from patient and sister.   Objective:   BP (!) 145/72   Pulse 78   Ht 5\' 4"  (1.626 m)   Wt 228 lb 11.2 oz (103.7 kg)   BMI 39.26 kg/m    CONSTITUTIONAL: Well-developed, well-nourished female in no acute distress.   SKIN: Dry, excoriated. Pedunculated blueberry sized, beefy red lesion present on  left inner thigh-actively bleeding  ABDOMEN: Soft, distended; Low abdomen tender to touch.   PELVIC:  External Genitalia: Normal  Vagina: Normal  Cervix: Difficulty to visualize, swab collected  Uterus: Enlarged mass present possibly uterus  MUSCULOSKELETAL: Decreased range of motion. No tenderness.     EXAM: CT ABDOMEN AND PELVIS WITH CONTRAST  DATE OF SERVICE: 11/04/2019  TECHNIQUE: Multidetector CT imaging of the abdomen and pelvis was performed using the standard protocol following bolus administration of intravenous contrast.  CONTRAST:  170mL OMNIPAQUE IOHEXOL 300 MG/ML  SOLN  COMPARISON:  09/24/2019  FINDINGS: Lower chest: Small pericardial effusion without significant change. This measures 1.4 cm in maximum thickness. Stable enlargement of  the heart. Mild bibasilar bullous changes. Stable linear scarring at the left lung base.  Hepatobiliary: No focal liver abnormality is seen. No gallstones, gallbladder wall thickening, or biliary dilatation.  Pancreas: Unremarkable. No pancreatic ductal dilatation or surrounding inflammatory changes.  Spleen: Normal in size without focal abnormality.  Adrenals/Urinary Tract: Adrenal glands are unremarkable. Kidneys are normal, without renal calculi, focal lesion, or hydronephrosis. Bladder is unremarkable.  Stomach/Bowel: Very large number of colonic diverticula without evidence of diverticulitis. No mass seen. Normal appearing appendix and stomach. 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  Vascular/Lymphatic: No significant vascular findings are present. No enlarged abdominal or pelvic lymph nodes.  Reproductive: Stable densely calcified uterine fibroids. Normal appearing ovaries.  Other: Midline surgical scar. No hernia or free peritoneal fluid.  Musculoskeletal: Lower thoracic spine degenerative changes  and mild lumbar spine degenerative changes.  IMPRESSION: 1. No acute abnormality. 2. Very large number of colonic diverticula without evidence of diverticulitis. 3. 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 4. Stable small pericardial effusion. 5. Mild changes of COPD.  Assessment:   1. Postmenopausal bleeding  - Cervicovaginal ancillary only  2. Cyst of skin  - Surgical pathology  3. Generalized abdominal pain  Plan:   Vaginal swab collected, see orders.   Verbal consent obtained remove lesion to inner thigh, see note below.   Reviewed red flag symptoms and when to call.   RTC x 1-2 weeks for ultrasound and results review or sooner if needed.    Diona Fanti, CNM [facility], [facility] 12/02/19 5:35 PM   Procedure Note:  Lananh Millar Graul is a 74 y.o. year old African American female here for removal thigh cyst/lesion.  Patient given informed consent for removal.  BP (!) 145/72   Pulse 78   Ht 5\' 4"  (1.626 m)   Wt 228 lb 11.2 oz (103.7 kg)   BMI 39.26 kg/m   Appropriate time out taken. Site identified.  Area prepped in usual sterile fashion after thoroughly cleaning with soap and water. One cc of 2% lidocaine was used to anesthetize the area at the stalk of the lesion. A small stab incision and the cyst/lesion was excised and grasped using hemostats then removed without difficulty.  There was less than  10 cc blood loss; silver nitrate used until area hemostatic. There were no complications.  Gauze applied.  The patient tolerated the procedure well.  She was instructed to keep the area clean and dry.   Rx Bactrim, see orders.     Reviewed red flag symptoms and when to call.   RTC as indicated above.    Diona Fanti, CNM [facility], [facility] 12/02/19 5:39 PM    I spent 30 minutes dedicated to the care of this patient on the date of  this encounter to include pre-visit review of records, face to face time with the patient, and post visit ordering of testing.

## 2019-12-02 NOTE — Patient Instructions (Signed)
Postmenopausal Bleeding  Postmenopausal bleeding is any bleeding that occurs after menopause. Menopause is when a woman's period stops. Any type of bleeding after menopause should be checked by your doctor. Treatment will depend on the cause. Follow these instructions at home:  Pay attention to any changes in your symptoms.  Avoid using tampons and douches as told by your doctor.  Change your pads regularly.  Get regular pelvic exams and Pap tests.  Take iron pills as told by your doctor.  Take over-the-counter and prescription medicines only as told by your doctor.  Keep all follow-up visits as told by your doctor. This is important. Contact a doctor if:  Your bleeding lasts for more than 1 week.  You have pain in your belly (abdomen).  You have bleeding during or after sex.  You have bleeding that happens more often than every 3 weeks. Get help right away if:  You have fever, chills, headache, dizziness, muscle aches, or bleeding.  You have very bad pain with bleeding.  You have clumps of blood (blood clots) coming from your vagina.  You have a lot of bleeding, and: ? You use more than 1 pad an hour. ? This kind of bleeding has never happened before.  You feel like you are going to pass out (faint). Summary  Any type of bleeding after menopause should be checked by your doctor.  Pay attention to any changes in your symptoms.  Keep all follow-up visits as told by your doctor. This information is not intended to replace advice given to you by your health care provider. Make sure you discuss any questions you have with your health care provider. Document Revised: 09/20/2018 Document Reviewed: 08/09/2016 Elsevier Patient Education  2020 [employer].   Uterine Fibroids  Uterine fibroids are lumps of tissue (tumors) in your womb (uterus). They are not cancer (are benign). Most women with this condition do not need treatment. Sometimes fibroids can affect your  ability to have children (your fertility). If that happens, you may need surgery to take out the fibroids. Follow these instructions at home:  Take over-the-counter and prescription medicines only as told by your doctor. Your doctor may suggest NSAIDs (such as aspirin or ibuprofen) to help with pain.  Ask your doctor if you should: ? Take iron pills. ? Eat more foods that have iron in them, such as dark green, leafy vegetables.  If directed, apply heat to your back or belly to reduce pain. Use the heat source that your doctor recommends, such as a moist heat pack or a heating pad. ? Put a towel between your skin and the heat source. ? Leave the heat on for 20-30 minutes. ? Remove the heat if your skin turns bright red. This is especially important if you are unable to feel pain, heat, or cold. You may have a greater risk of getting burned.  Pay close attention to your period (menstrual) cycles. Tell your doctor about any changes, such as: ? A heavier blood flow than usual. ? Needing to use more pads or tampons than normal. ? A change in how many days your period lasts. ? A change in symptoms that come with your period, such as cramps or back pain.  Keep all follow-up visits as told by your doctor. This is important. Your doctor may need to watch your fibroids over time for any changes. Contact a doctor if you:  Have pain that does not get better with medicine or heat, such as pain or  cramps in: ? Your back. ? The area between your hip bones (pelvic area). ? Your belly.  Have new bleeding between your periods.  Have more bleeding during or between your periods.  Feel very tired or weak.  Feel light-headed. Get help right away if you:  Pass out (faint).  Have pain in the area between your hip bones that suddenly gets worse.  Have bleeding that soaks a tampon or pad in 30 minutes or less. Summary  Uterine fibroids are lumps of tissue (tumors) in your womb (uterus). They are  not cancer.  The only treatment that most women need is taking aspirin or ibuprofen for pain.  Contact a doctor if you have pain or cramps that do not get better with medicine.  Make sure you know what symptoms you should get help for right away. This information is not intended to replace advice given to you by your health care provider. Make sure you discuss any questions you have with your health care provider. Document Revised: 06/16/2017 Document Reviewed: 05/30/2017 Elsevier Patient Education  2020 [employer].

## 2019-12-02 NOTE — Telephone Encounter (Signed)
Prescription sent to pharmacy. LM on voicemail that prescription has been sent to the pharmacy.

## 2019-12-02 NOTE — Telephone Encounter (Signed)
Callie ( the lady who helped the pt today ) called in and stated she went to the pharmacy and the pts meds from today visit, there wasn't an order for them . The pt is requesting them to be sent to [facility]. Please advise

## 2019-12-02 NOTE — Telephone Encounter (Signed)
Please send Rx. Thanks, JML

## 2019-12-04 ENCOUNTER — Other Ambulatory Visit: Payer: Self-pay

## 2019-12-04 ENCOUNTER — Encounter: Payer: Self-pay | Admitting: Gastroenterology

## 2019-12-04 LAB — CERVICOVAGINAL ANCILLARY ONLY
Bacterial Vaginitis (gardnerella): NEGATIVE
Candida Glabrata: NEGATIVE
Candida Vaginitis: NEGATIVE
Chlamydia: NEGATIVE
Comment: NEGATIVE
Comment: NEGATIVE
Comment: NEGATIVE
Comment: NEGATIVE
Comment: NEGATIVE
Comment: NORMAL
Neisseria Gonorrhea: NEGATIVE
Trichomonas: NEGATIVE

## 2019-12-04 LAB — SURGICAL PATHOLOGY

## 2019-12-09 ENCOUNTER — Other Ambulatory Visit: Payer: Self-pay

## 2019-12-09 ENCOUNTER — Other Ambulatory Visit (HOSPITAL_COMMUNITY)
Admission: RE | Admit: 2019-12-09 | Discharge: 2019-12-09 | Disposition: A | Discharge status: Home / Self Care | Payer: Medicare Other | Source: Ambulatory Visit | Attending: Certified Nurse Midwife | Admitting: Certified Nurse Midwife

## 2019-12-09 ENCOUNTER — Ambulatory Visit (INDEPENDENT_AMBULATORY_CARE_PROVIDER_SITE_OTHER): Payer: Medicare Other | Admitting: Certified Nurse Midwife

## 2019-12-09 ENCOUNTER — Encounter: Payer: Self-pay | Admitting: Certified Nurse Midwife

## 2019-12-09 ENCOUNTER — Ambulatory Visit (INDEPENDENT_AMBULATORY_CARE_PROVIDER_SITE_OTHER): Payer: Medicare Other

## 2019-12-09 VITALS — BP 109/62 | HR 70 | Ht 64.0 in | Wt 227.5 lb

## 2019-12-09 DIAGNOSIS — D259 Leiomyoma of uterus, unspecified: Secondary | ICD-10-CM

## 2019-12-09 DIAGNOSIS — N950 Postmenopausal bleeding: Secondary | ICD-10-CM | POA: Diagnosis not present

## 2019-12-09 DIAGNOSIS — R1084 Generalized abdominal pain: Secondary | ICD-10-CM

## 2019-12-09 NOTE — Progress Notes (Signed)
Pt present for ultrasound results. Pt stated that she was doing well no problems.  

## 2019-12-09 NOTE — Patient Instructions (Signed)
Uterine Fibroids  Uterine fibroids are lumps of tissue (tumors) in your womb (uterus). They are not cancer (are benign). Most women with this condition do not need treatment. Sometimes fibroids can affect your ability to have children (your fertility). If that happens, you may need surgery to take out the fibroids. Follow these instructions at home:  Take over-the-counter and prescription medicines only as told by your doctor. Your doctor may suggest NSAIDs (such as aspirin or ibuprofen) to help with pain.  Ask your doctor if you should: ? Take iron pills. ? Eat more foods that have iron in them, such as dark green, leafy vegetables.  If directed, apply heat to your back or belly to reduce pain. Use the heat source that your doctor recommends, such as a moist heat pack or a heating pad. ? Put a towel between your skin and the heat source. ? Leave the heat on for 20-30 minutes. ? Remove the heat if your skin turns bright red. This is especially important if you are unable to feel pain, heat, or cold. You may have a greater risk of getting burned.  Pay close attention to your period (menstrual) cycles. Tell your doctor about any changes, such as: ? A heavier blood flow than usual. ? Needing to use more pads or tampons than normal. ? A change in how many days your period lasts. ? A change in symptoms that come with your period, such as cramps or back pain.  Keep all follow-up visits as told by your doctor. This is important. Your doctor may need to watch your fibroids over time for any changes. Contact a doctor if you:  Have pain that does not get better with medicine or heat, such as pain or cramps in: ? Your back. ? The area between your hip bones (pelvic area). ? Your belly.  Have new bleeding between your periods.  Have more bleeding during or between your periods.  Feel very tired or weak.  Feel light-headed. Get help right away if you:  Pass out (faint).  Have pain in the  area between your hip bones that suddenly gets worse.  Have bleeding that soaks a tampon or pad in 30 minutes or less. Summary  Uterine fibroids are lumps of tissue (tumors) in your womb (uterus). They are not cancer.  The only treatment that most women need is taking aspirin or ibuprofen for pain.  Contact a doctor if you have pain or cramps that do not get better with medicine.  Make sure you know what symptoms you should get help for right away. This information is not intended to replace advice given to you by your health care provider. Make sure you discuss any questions you have with your health care provider. Document Revised: 06/16/2017 Document Reviewed: 05/30/2017 Elsevier Patient Education  2020 [employer]  

## 2019-12-09 NOTE — Progress Notes (Signed)
GYN ENCOUNTER NOTE  Subjective:       Taylor Johnson is a 74 y.o. G0P0000 female here for ultrasound and lab results review. Accompanied by sister.   Last seen in office on 12/02/2019 for evaluation of postmenopausal bleeding.   Reports no bleeding since last appointment.   Denies difficulty breathing or respiratory distress, chest pain, dysuria, vaginal bleeding, dysuria, and leg pain or swelling.    Gynecologic History  No LMP recorded. Patient has had a hysterectomy.  Contraception: post menopausal status  Last Pap: unknown  Last mammogram: 05/2019. Results were: BI-RADS 1  Last colonoscopy: 04/2014. Results were: normal  Obstetric History  OB History  Gravida Para Term Preterm AB Living  0 0 0 0 0 0  SAB TAB Ectopic Multiple Live Births  0 0 0 0      Past Medical History:  Diagnosis Date  . Abdominal pain 01/27/2017  . Anxiety   . Anxiety, generalized 05/11/2015  . Cough 06/02/2016   Chronic - followed by Pulmonary  . Depression   . Developmental delay   . Diverticulitis   . Diverticulosis of colon 05/11/2015  . Dysphagia 10/15/2015   Routine Ba Swallow normal; rec modified barium swallow   . GERD (gastroesophageal reflux disease)   . Hypoxia 09/12/2015  . Leg swelling   . Localized edema 04/15/2016  . Microscopic hematuria 04/15/2016  . Nausea and vomiting 01/29/2017  . Orthostatic hypotension 03/16/2017  . OSA on CPAP 11/18/2015   CPAP @ 18 cm H2O started 02/2016  . Rectal bleeding 10/15/2018  . Urge incontinence of urine 05/12/2015    Past Surgical History:  Procedure Laterality Date  . ABDOMINAL HYSTERECTOMY    . BREAST BIOPSY Left    neg  . COLON SURGERY    . ESOPHAGOGASTRODUODENOSCOPY (EGD) WITH PROPOFOL N/A 01/29/2017   Procedure: ESOPHAGOGASTRODUODENOSCOPY (EGD) WITH PROPOFOL;  Surgeon: Wilford Corner, MD;  Location: [facility] ENDOSCOPY;  Service: Endoscopy;  Laterality: N/A;    Current Outpatient Medications on File Prior to Visit  Medication Sig  Dispense Refill  . cyclobenzaprine (FLEXERIL) 5 MG tablet Take 1 tablet (5 mg total) by mouth 3 (three) times daily as needed (abdominal muscle spasms). 30 tablet 0  . dicyclomine (BENTYL) 10 MG capsule Take 2 capsules (20 mg total) by mouth 4 (four) times daily -  before meals and at bedtime. 120 capsule 0  . hydrALAZINE (APRESOLINE) 25 MG tablet Take 1 tablet (25 mg total) by mouth 3 (three) times daily. 90 tablet 5  . meclizine (ANTIVERT) 12.5 MG tablet Take 12.5 mg by mouth 3 (three) times daily as needed.    . metoCLOPramide (REGLAN) 5 MG tablet Take 1 tablet (5 mg total) by mouth 3 (three) times daily before meals. 90 tablet 5  . pantoprazole (PROTONIX) 40 MG tablet Take 1 tablet (40 mg total) by mouth daily. 30 tablet 0  . simethicone (MYLICON) 80 MG chewable tablet Chew 1 tablet (80 mg total) by mouth 4 (four) times daily as needed for flatulence (gas pains). 30 tablet 0  . sulfamethoxazole-trimethoprim (BACTRIM DS) 800-160 MG tablet Take 1 tablet by mouth 2 (two) times daily. 14 tablet 0  . vitamin B-12 (CYANOCOBALAMIN) 500 MCG tablet Take 500 mcg by mouth daily.     No current facility-administered medications on file prior to visit.    No Known Allergies  Social History   Socioeconomic History  . Marital status: Single    Spouse name: Not on file  . Number of children:  0  . Years of education: Not on file  . Highest education level: 5th grade  Occupational History    Employer: retired  Tobacco Use  . Smoking status: Never Smoker  . Smokeless tobacco: Never Used  . Tobacco comment: smoking cessation materials not required  Substance and Sexual Activity  . Alcohol use: No    Alcohol/week: 0.0 standard drinks  . Drug use: No  . Sexual activity: Never    Birth control/protection: Post-menopausal  Other Topics Concern  . Not on file  Social History Narrative  . Not on file   Social Determinants of Health   Financial Resource Strain: Low Risk   . Difficulty of Paying  Living Expenses: Not very hard  Food Insecurity:   . Worried About Charity fundraiser in the Last Year:   . Arboriculturist in the Last Year:   Transportation Needs:   . Film/video editor (Medical):   [name] Lack of Transportation (Non-Medical):   Physical Activity:   . Days of Exercise per Week:   . Minutes of Exercise per Session:   Stress:   . Feeling of Stress :   Social Connections: Unknown  . Frequency of Communication with Friends and Family: Patient refused  . Frequency of Social Gatherings with Friends and Family: Patient refused  . Attends Religious Services: Patient refused  . Active Member of Clubs or Organizations: Patient refused  . Attends Archivist Meetings: Patient refused  . Marital Status: Never married  Intimate Partner Violence: Not At Risk  . Fear of Current or Ex-Partner: No  . Emotionally Abused: No  . Physically Abused: No  . Sexually Abused: No    Family History  Problem Relation Age of Onset  . Hypertension Mother   . Kidney disease Mother   . Dementia Mother   . Cancer Father        lung  . Bladder Cancer Neg Hx   . Breast cancer Neg Hx     The following portions of the patient's history were reviewed and updated as appropriate: allergies, current medications, past family history, past medical history, past social history, past surgical history and problem list.  Review of Systems  ROS negative except as noted above. Information obtained from patient and sister.    Objective:   BP 109/62   Pulse 70   Ht 5\' 4"  (1.626 m)   Wt 227 lb 8 oz (103.2 kg)   BMI 39.05 kg/m    CONSTITUTIONAL: Well-developed, well-nourished female in no acute distress.   PELVIC:  External Genitalia: Normal  BUS: Normal  Vagina: Normal  Cervix: Normal  Uterus: Enlarged   MUSCULOSKELETAL: Normal range of motion. No tenderness.  No cyanosis, clubbing, or edema.  Recent Results (from the past 2160 hour(s))  Comprehensive metabolic panel      Status: Abnormal   Collection Time: 09/24/19 11:22 AM  Result Value Ref Range   Sodium 139 135 - 145 mmol/L   Potassium 3.7 3.5 - 5.1 mmol/L   Chloride 100 98 - 111 mmol/L   CO2 30 22 - 32 mmol/L   Glucose, Bld 103 (H) 70 - 99 mg/dL    Comment: Glucose reference range applies only to samples taken after fasting for at least 8 hours.   BUN 18 8 - 23 mg/dL   Creatinine, Ser 1.05 (H) 0.44 - 1.00 mg/dL   Calcium 9.2 8.9 - 10.3 mg/dL   Total Protein 7.6 6.5 - 8.1 g/dL  Albumin 3.7 3.5 - 5.0 g/dL   AST 14 (L) 15 - 41 U/L   ALT 11 0 - 44 U/L   Alkaline Phosphatase 90 38 - 126 U/L   Total Bilirubin 0.6 0.3 - 1.2 mg/dL   GFR calc non Af Amer 53 (L) >60 mL/min   GFR calc Af Amer >60 >60 mL/min   Anion gap 9 5 - 15    Comment: Performed at [facility], [address] [place], [place] [address]  CBC     Status: Abnormal   Collection Time: 09/24/19 11:22 AM  Result Value Ref Range   WBC 5.3 4.0 - 10.5 K/uL   RBC 4.40 3.87 - 5.11 MIL/uL   Hemoglobin 12.1 12.0 - 15.0 g/dL   HCT 39.8 36.0 - 46.0 %   MCV 90.5 80.0 - 100.0 fL   MCH 27.5 26.0 - 34.0 pg   MCHC 30.4 30.0 - 36.0 g/dL   RDW 16.1 (H) 11.5 - 15.5 %   Platelets 220 150 - 400 K/uL   nRBC 0.0 0.0 - 0.2 %    Comment: Performed at [facility], [address] [place], [place] [address]  Type and screen [place]     Status: None   Collection Time: 09/24/19 11:22 AM  Result Value Ref Range   ABO/RH(D) O POS    Antibody Screen NEG    Sample Expiration      09/27/2019,2359 Performed at [facility], [address] [place], [place] [address]   Urinalysis, Complete w Microscopic     Status: Abnormal   Collection Time: 09/24/19  4:41 PM  Result Value Ref Range   Color, Urine STRAW (A) YELLOW   APPearance CLEAR (A) CLEAR   Specific Gravity, Urine 1.034 (H) 1.005 - 1.030   pH 6.0 5.0 - 8.0   Glucose, UA NEGATIVE NEGATIVE mg/dL   Hgb urine dipstick SMALL (A) NEGATIVE    Bilirubin Urine NEGATIVE NEGATIVE   Ketones, ur NEGATIVE NEGATIVE mg/dL   Protein, ur NEGATIVE NEGATIVE mg/dL   Nitrite NEGATIVE NEGATIVE   Leukocytes,Ua SMALL (A) NEGATIVE   RBC / HPF 6-10 0 - 5 RBC/hpf   WBC, UA 11-20 0 - 5 WBC/hpf   Bacteria, UA NONE SEEN NONE SEEN   Squamous Epithelial / LPF 0-5 0 - 5    Comment: Performed at [facility], [address] [place],  [address]  Comprehensive metabolic panel     Status: Abnormal   Collection Time: 11/04/19  9:42 AM  Result Value Ref Range   Sodium 141 135 - 145 mmol/L   Potassium 3.9 3.5 - 5.1 mmol/L   Chloride 101 98 - 111 mmol/L   CO2 31 22 - 32 mmol/L   Glucose, Bld 103 (H) 70 - 99 mg/dL    Comment: Glucose reference range applies only to samples taken after fasting for at least 8 hours.   BUN 14 8 - 23 mg/dL   Creatinine, Ser 0.82 0.44 - 1.00 mg/dL   Calcium 9.4 8.9 - 10.3 mg/dL   Total Protein 8.0 6.5 - 8.1 g/dL   Albumin 4.2 3.5 - 5.0 g/dL   AST 12 (L) 15 - 41 U/L   ALT 12 0 - 44 U/L   Alkaline Phosphatase 90 38 - 126 U/L   Total Bilirubin 0.4 0.3 - 1.2 mg/dL   GFR calc non Af Amer >60 >60 mL/min   GFR calc Af Amer >60 >60 mL/min   Anion  gap 9 5 - 15    Comment: Performed at [facility], [address] [place], [place] [address]  CBC     Status: Abnormal   Collection Time: 11/04/19  9:42 AM  Result Value Ref Range   WBC 4.8 4.0 - 10.5 K/uL   RBC 4.70 3.87 - 5.11 MIL/uL   Hemoglobin 13.0 12.0 - 15.0 g/dL   HCT 42.1 36.0 - 46.0 %   MCV 89.6 80.0 - 100.0 fL   MCH 27.7 26.0 - 34.0 pg   MCHC 30.9 30.0 - 36.0 g/dL   RDW 16.5 (H) 11.5 - 15.5 %   Platelets 209 150 - 400 K/uL   nRBC 0.0 0.0 - 0.2 %    Comment: Performed at [facility], [address] [place], [place] [address]  Type and screen [place]     Status: None   Collection Time: 11/04/19  9:42 AM  Result Value Ref Range   ABO/RH(D) O POS    Antibody Screen NEG    Sample Expiration       11/07/2019,2359 Performed at [facility], [address] [place], [place] [address]   CBC with Differential     Status: Abnormal   Collection Time: 11/04/19  2:16 PM  Result Value Ref Range   WBC 5.7 4.0 - 10.5 K/uL   RBC 4.74 3.87 - 5.11 MIL/uL   Hemoglobin 13.1 12.0 - 15.0 g/dL   HCT 42.4 36.0 - 46.0 %   MCV 89.5 80.0 - 100.0 fL   MCH 27.6 26.0 - 34.0 pg   MCHC 30.9 30.0 - 36.0 g/dL   RDW 16.6 (H) 11.5 - 15.5 %   Platelets 211 150 - 400 K/uL   nRBC 0.0 0.0 - 0.2 %   Neutrophils Relative % 51 %   Neutro Abs 2.9 1.7 - 7.7 K/uL   Lymphocytes Relative 32 %   Lymphs Abs 1.8 0.7 - 4.0 K/uL   Monocytes Relative 15 %   Monocytes Absolute 0.9 0.1 - 1.0 K/uL   Eosinophils Relative 1 %   Eosinophils Absolute 0.0 0.0 - 0.5 K/uL   Basophils Relative 1 %   Basophils Absolute 0.0 0.0 - 0.1 K/uL   Immature Granulocytes 0 %   Abs Immature Granulocytes 0.02 0.00 - 0.07 K/uL    Comment: Performed at [facility], [address] [place], [place] [address]  Comprehensive metabolic panel     Status: Abnormal   Collection Time: 11/07/19  1:37 PM  Result Value Ref Range   Sodium 140 135 - 145 mmol/L   Potassium 3.7 3.5 - 5.1 mmol/L   Chloride 100 98 - 111 mmol/L   CO2 30 22 - 32 mmol/L   Glucose, Bld 104 (H) 70 - 99 mg/dL    Comment: Glucose reference range applies only to samples taken after fasting for at least 8 hours.   BUN 17 8 - 23 mg/dL   Creatinine, Ser 0.97 0.44 - 1.00 mg/dL   Calcium 9.2 8.9 - 10.3 mg/dL   Total Protein 8.1 6.5 - 8.1 g/dL   Albumin 4.2 3.5 - 5.0 g/dL   AST 14 (L) 15 - 41 U/L   ALT 11 0 - 44 U/L   Alkaline Phosphatase 92 38 - 126 U/L   Total Bilirubin 0.4 0.3 - 1.2 mg/dL   GFR calc non Af Amer 58 (L) >60 mL/min   GFR calc Af Amer >60 >60 mL/min   Anion gap 10  5 - 15    Comment: Performed at [facility], [address] [place], [place] [address]  CBC     Status: Abnormal   Collection Time: 11/07/19  1:37 PM  Result Value Ref  Range   WBC 4.6 4.0 - 10.5 K/uL   RBC 4.65 3.87 - 5.11 MIL/uL   Hemoglobin 12.8 12.0 - 15.0 g/dL   HCT 42.6 36.0 - 46.0 %   MCV 91.6 80.0 - 100.0 fL   MCH 27.5 26.0 - 34.0 pg   MCHC 30.0 30.0 - 36.0 g/dL   RDW 16.5 (H) 11.5 - 15.5 %   Platelets 208 150 - 400 K/uL   nRBC 0.0 0.0 - 0.2 %    Comment: Performed at [facility], [address] [place], [place] [address]  Type and screen [place]     Status: None   Collection Time: 11/07/19  1:37 PM  Result Value Ref Range   ABO/RH(D) O POS    Antibody Screen NEG    Sample Expiration      11/10/2019,2359 Performed at [facility], [address] [place], [place] [address]   SARS CORONAVIRUS 2 (TAT 6-24 HRS) Nasopharyngeal Nasopharyngeal Swab     Status: None   Collection Time: 11/07/19  5:00 PM   Specimen: Nasopharyngeal Swab  Result Value Ref Range   SARS Coronavirus 2 NEGATIVE NEGATIVE    Comment: (NOTE) SARS-CoV-2 target nucleic acids are NOT DETECTED. The SARS-CoV-2 RNA is generally detectable in upper and lower respiratory specimens during the acute phase of infection. Negative results do not preclude SARS-CoV-2 infection, do not rule out co-infections with other pathogens, and should not be used as the sole basis for treatment or other patient management decisions. Negative results must be combined with clinical observations, patient history, and epidemiological information. The expected result is Negative. Fact Sheet for Patients: SugarRoll.be Fact Sheet for Healthcare Providers: https://www.woods-mathews.com/ This test is not yet approved or cleared by the [place] FDA and  has been authorized for detection and/or diagnosis of SARS-CoV-2 by FDA under an Emergency Use Authorization (EUA). This EUA will remain  in effect (meaning this test can be used) for the duration of the COVID-19 declaration under Section 56 4(b)(1) of the Act,  21 U.S.C. section 360bbb-3(b)(1), unless the authorization is terminated or revoked sooner. Performed at [facility], [place] [address], [place], [place] [id number]   Basic metabolic panel     Status: Abnormal   Collection Time: 11/08/19  5:40 AM  Result Value Ref Range   Sodium 140 135 - 145 mmol/L   Potassium 3.9 3.5 - 5.1 mmol/L   Chloride 103 98 - 111 mmol/L   CO2 29 22 - 32 mmol/L   Glucose, Bld 99 70 - 99 mg/dL    Comment: Glucose reference range applies only to samples taken after fasting for at least 8 hours.   BUN 20 8 - 23 mg/dL   Creatinine, Ser 0.94 0.44 - 1.00 mg/dL   Calcium 8.8 (L) 8.9 - 10.3 mg/dL   GFR calc non Af Amer >60 >60 mL/min   GFR calc Af Amer >60 >60 mL/min   Anion gap 8 5 - 15    Comment: Performed at [facility], [address] [place], [place] [address]  CBC     Status: Abnormal   Collection Time: 11/08/19  5:40 AM  Result Value Ref Range   WBC 4.4 4.0 - 10.5 K/uL  RBC 4.32 3.87 - 5.11 MIL/uL   Hemoglobin 12.1 12.0 - 15.0 g/dL   HCT 39.4 36.0 - 46.0 %   MCV 91.2 80.0 - 100.0 fL   MCH 28.0 26.0 - 34.0 pg   MCHC 30.7 30.0 - 36.0 g/dL   RDW 16.5 (H) 11.5 - 15.5 %   Platelets 178 150 - 400 K/uL   nRBC 0.0 0.0 - 0.2 %    Comment: Performed at [facility], [address] [place], [place] [address]  Surgical pathology     Status: None   Collection Time: 12/02/19  2:07 PM  Result Value Ref Range   SURGICAL PATHOLOGY      SURGICAL PATHOLOGY CASE: [id number] PATIENT: Laqueta Carina Surgical Pathology Report     Clinical History: cyst of skin (cm)     FINAL MICROSCOPIC DIAGNOSIS:  A. CYST, LEFT UPPER INNER THIGH, EXCISION: - Benign pyogenic granuloma.   GROSS DESCRIPTION:  The specimen is received in formalin and consists of 2 nodular portions of tan soft tissue, each measuring 0.9 cm in greatest dimension. Sectioning reveals a tan glistening cut surfaces.  The specimen is entirely submitted in 1 cassette.  Craig Staggers 12/04/2019)    Final Diagnosis performed by Vicente Males, MD.   Electronically signed 12/04/2019 Technical component performed at [employer]. [facility], [place] [address], [place], [place] [address].  Professional component performed at [facility], [place] [address] [place], [place] [address].  Immunohistochemistry Technical component (if applicable) was performed at [facility]. [address] [place], [place], [place]  [address].   IMMUNOHISTOCHEMISTRY DISCLAIMER (if applicable): Some of these immunohistochemical stains may have been developed and the performance characteristics determine by [facility]. Some may not have been cleared or approved by the U.S. Food and Drug Administration. The FDA has determined that such clearance or approval is not necessary. This test is used for clinical purposes. It should not be regarded as investigational or for research. This laboratory is certified under the [place] (CLIA-88) as qualified to perform high complexity clinical laboratory testing.  The controls stained appropriately.   Cervicovaginal ancillary only     Status: None   Collection Time: 12/02/19  2:07 PM  Result Value Ref Range   Neisseria Gonorrhea Negative    Chlamydia Negative    Trichomonas Negative    Bacterial Vaginitis (gardnerella) Negative    Candida Vaginitis Negative    Candida Glabrata Negative    Comment      Normal Reference Range Bacterial Vaginosis - Negative   Comment Normal Reference Range Candida Species - Negative    Comment Normal Reference Range Candida Galbrata - Negative    Comment Normal Reference Range Trichomonas - Negative    Comment Normal Reference [place] Chlamydia - Negative    Comment      Normal Reference Range Neisseria Gonorrhea - Negative   ULTRASOUND REPORT  Location: [facility]  Date of Service: 12/09/2019      Indications:Enlarged Uterus Findings:  The uterus is anteverted and measures 12.3 x 6.4 x 7.6 cm. Echo texture is heterogenous with evidence of focal masses. Within the uterus are multiple suspected fibroids measuring: Fibroid 1:Lower calcified IM 4.3 x 4.1 x 5.9 cm Fibroid 2:Anterior SS 3.1 x 2.8 x 3.6 cm Fibroid 3: Central IM 6.2 x 5.1 x 6.3 cm  The Endometrium measures Not well visualized due to central fibroid.   Right Ovary Not seen  Left Ovary Not  seen.   Survey of the adnexa demonstrates no adnexal masses. There is no free fluid in the cul de sac.  Impression: 1. Limited exam due to enlargement of uterus. 2.Survey of the adnexa demonstrates no adnexal masses.  Recommendations: 1.Clinical correlation with the patient's History and Physical Exam.   Assessment:   1. Postmenopausal bleeding   2. Uterine leiomyoma, unspecified location   3. Generalized abdominal pain  Plan:   Labs reviewed with patient and sister, verbalized understanding.   Agrees to endometrial biopsy today, see notes below.   Reviewed red flag symptoms and when to call.   RTC for follow up after results of biopsy.    Diona Fanti, CNM [facility], [facility] 12/09/19 5:42 PM     Endometrial Biopsy Note:  A timeout protocol was performed prior to initiating the procedure.  Vaginal speculum was inserted, and the cervix was visualized.   Patient requested for provider to stop exam due to discomfort.   Exam stopped and patient's sister escorted back to room.   Patient's sister advised provider to return to room and attempt procedure again.   A timeout protocol was performed prior to initiating the procedure. Verbal consent was obtained to completed endometrial biopsy.   Vaginal speculum was inserted, and the cervix was visualized.   The cervix was cleansed with antiseptic solution.  The device was inserted through the cervical canal, into the uterine  cavity, and up to the fundus.   The depth of the uterus was determined with the instrument.   The instrument was withdrawn as it was rotated 3-4 times to obtain the sample which was sent in formalin to pathology   Followup: The patient tolerated the procedure well without complications. Standard post-procedure care is explained and return precautions are given.   Diona Fanti, CNM [facility], [facility] 12/09/19 5:43 PM

## 2019-12-11 ENCOUNTER — Other Ambulatory Visit
Admission: RE | Admit: 2019-12-11 | Discharge: 2019-12-11 | Disposition: A | Discharge status: Home / Self Care | Payer: Medicare Other | Source: Ambulatory Visit | Attending: Gastroenterology | Admitting: Gastroenterology

## 2019-12-11 ENCOUNTER — Other Ambulatory Visit: Payer: Self-pay

## 2019-12-11 DIAGNOSIS — Z01812 Encounter for preprocedural laboratory examination: Secondary | ICD-10-CM | POA: Diagnosis present

## 2019-12-11 DIAGNOSIS — Z20822 Contact with and (suspected) exposure to covid-19: Secondary | ICD-10-CM | POA: Diagnosis not present

## 2019-12-11 LAB — SARS CORONAVIRUS 2 (TAT 6-24 HRS): SARS Coronavirus 2: NEGATIVE

## 2019-12-11 LAB — SURGICAL PATHOLOGY

## 2019-12-12 NOTE — Progress Notes (Signed)
Please contact patient's sister with results. Unable to obtain adequate specimen for evaluation. Recommend repeat attempt endometrial biopsy. May schedule with me or MD. Thanks, JML

## 2019-12-13 ENCOUNTER — Ambulatory Visit: Payer: Self-pay | Admitting: Anesthesiology

## 2019-12-13 ENCOUNTER — Encounter: Payer: Self-pay | Admitting: Anesthesiology

## 2019-12-13 ENCOUNTER — Encounter
Admission: RE | Disposition: A | Discharge status: Home / Self Care | Payer: Self-pay | Source: Home / Self Care | Attending: Gastroenterology

## 2019-12-13 ENCOUNTER — Ambulatory Visit
Admission: RE | Admit: 2019-12-13 | Discharge: 2019-12-13 | Disposition: A | Discharge status: Home / Self Care | Payer: Medicare Other | Attending: Gastroenterology | Admitting: Gastroenterology

## 2019-12-13 ENCOUNTER — Encounter: Payer: Self-pay | Admitting: Gastroenterology

## 2019-12-13 DIAGNOSIS — K5730 Diverticulosis of large intestine without perforation or abscess without bleeding: Secondary | ICD-10-CM | POA: Diagnosis not present

## 2019-12-13 DIAGNOSIS — F419 Anxiety disorder, unspecified: Secondary | ICD-10-CM | POA: Insufficient documentation

## 2019-12-13 DIAGNOSIS — K219 Gastro-esophageal reflux disease without esophagitis: Secondary | ICD-10-CM | POA: Insufficient documentation

## 2019-12-13 DIAGNOSIS — K625 Hemorrhage of anus and rectum: Secondary | ICD-10-CM | POA: Diagnosis not present

## 2019-12-13 DIAGNOSIS — F329 Major depressive disorder, single episode, unspecified: Secondary | ICD-10-CM | POA: Insufficient documentation

## 2019-12-13 DIAGNOSIS — K648 Other hemorrhoids: Secondary | ICD-10-CM | POA: Diagnosis not present

## 2019-12-13 DIAGNOSIS — D124 Benign neoplasm of descending colon: Secondary | ICD-10-CM | POA: Insufficient documentation

## 2019-12-13 DIAGNOSIS — G4733 Obstructive sleep apnea (adult) (pediatric): Secondary | ICD-10-CM | POA: Diagnosis not present

## 2019-12-13 DIAGNOSIS — Z79899 Other long term (current) drug therapy: Secondary | ICD-10-CM | POA: Diagnosis not present

## 2019-12-13 DIAGNOSIS — D122 Benign neoplasm of ascending colon: Secondary | ICD-10-CM | POA: Diagnosis not present

## 2019-12-13 DIAGNOSIS — K635 Polyp of colon: Secondary | ICD-10-CM | POA: Diagnosis not present

## 2019-12-13 DIAGNOSIS — K921 Melena: Secondary | ICD-10-CM | POA: Insufficient documentation

## 2019-12-13 HISTORY — PX: COLONOSCOPY WITH PROPOFOL: SHX5780

## 2019-12-13 HISTORY — DX: Gastro-esophageal reflux disease without esophagitis: K21.9

## 2019-12-13 SURGERY — COLONOSCOPY WITH PROPOFOL
Anesthesia: General

## 2019-12-13 MED ORDER — LIDOCAINE HCL (PF) 1 % IJ SOLN
INTRAMUSCULAR | Status: AC
  Filled 2019-12-13: qty 2

## 2019-12-13 MED ORDER — PROPOFOL 500 MG/50ML IV EMUL
INTRAVENOUS | Status: DC | PRN
  Administered 2019-12-13: 140 ug/kg/min via INTRAVENOUS

## 2019-12-13 MED ORDER — LIDOCAINE HCL (CARDIAC) PF 100 MG/5ML IV SOSY
PREFILLED_SYRINGE | INTRAVENOUS | Status: DC | PRN
  Administered 2019-12-13: 100 mg via INTRAVENOUS

## 2019-12-13 MED ORDER — SODIUM CHLORIDE 0.9 % IV SOLN
INTRAVENOUS | Status: DC
  Administered 2019-12-13: 1000 mL via INTRAVENOUS

## 2019-12-13 MED ORDER — PROPOFOL 10 MG/ML IV BOLUS
INTRAVENOUS | Status: DC | PRN
  Administered 2019-12-13: 70 mg via INTRAVENOUS

## 2019-12-13 MED ORDER — GLYCOPYRROLATE 0.2 MG/ML IJ SOLN
INTRAMUSCULAR | Status: DC | PRN
  Administered 2019-12-13: .2 mg via INTRAVENOUS

## 2019-12-13 MED ORDER — PHENYLEPHRINE HCL (PRESSORS) 10 MG/ML IV SOLN
INTRAVENOUS | Status: DC | PRN
  Administered 2019-12-13: 100 ug via INTRAVENOUS

## 2019-12-13 MED ORDER — EPHEDRINE SULFATE 50 MG/ML IJ SOLN
INTRAMUSCULAR | Status: DC | PRN
  Administered 2019-12-13: 10 mg via INTRAVENOUS

## 2019-12-13 NOTE — Op Note (Signed)
[facility] Patient Name: Taylor Johnson Procedure Date: 12/13/2019 10:25 AM MRN: [id number] Account #: [id number] Date of Birth: 1946/02/01 Admit Type: Outpatient Age: 74 Room: [facility] ENDO ROOM 3 Gender: Female Note Status: Finalized Procedure:             Colonoscopy Indications:           Last colonoscopy: October 2015, Hematochezia Providers:             Lin Landsman MD, MD Referring MD:          Halina Maidens, MD (Referring MD) Medicines:             Monitored Anesthesia Care Complications:         No immediate complications. Estimated blood loss: None. Procedure:             Pre-Anesthesia Assessment:                        - Prior to the procedure, a History and Physical was                         performed, and patient medications and allergies were                         reviewed. The patient is competent. The risks and                         benefits of the procedure and the sedation options and                         risks were discussed with the patient. All questions                         were answered and informed consent was obtained.                         Patient identification and proposed procedure were                         verified by the physician, the nurse, the                         anesthesiologist, the anesthetist and the technician                         in the pre-procedure area in the procedure room in the                         endoscopy suite. Mental Status Examination: alert and                         oriented. Airway Examination: normal oropharyngeal                         airway and neck mobility. Respiratory Examination:                         clear to auscultation. CV Examination: normal.  Prophylactic Antibiotics: The patient does not require                         prophylactic antibiotics. Prior Anticoagulants: The                         patient has taken no previous  anticoagulant or                         antiplatelet agents. ASA Grade Assessment: III - A                         patient with severe systemic disease. After reviewing                         the risks and benefits, the patient was deemed in                         satisfactory condition to undergo the procedure. The                         anesthesia plan was to use monitored anesthesia care                         (MAC). Immediately prior to administration of                         medications, the patient was re-assessed for adequacy                         to receive sedatives. The heart rate, respiratory                         rate, oxygen saturations, blood pressure, adequacy of                         pulmonary ventilation, and response to care were                         monitored throughout the procedure. The physical                         status of the patient was re-assessed after the                         procedure.                        After obtaining informed consent, the colonoscope was                         passed under direct vision. Throughout the procedure,                         the patient's blood pressure, pulse, and oxygen                         saturations were monitored continuously. The  Colonoscope was introduced through the anus and                         advanced to the the cecum, identified by appendiceal                         orifice and ileocecal valve. The colonoscopy was                         extremely difficult due to multiple diverticula in the                         colon, significant looping and the patient's body                         habitus. Successful completion of the procedure was                         aided by changing the patient to a prone position,                         withdrawing and reinserting the scope and applying                         abdominal pressure. The patient tolerated the                          procedure well. The quality of the bowel preparation                         was evaluated using the BBPS [facility] Bowel Preparation                         Scale) with scores of: Right Colon = 3, Transverse                         Colon = 3 and Left Colon = 3 (entire mucosa seen well                         with no residual staining, small fragments of stool or                         opaque liquid). The total BBPS score equals 9. Findings:      Hemorrhoids were found on perianal exam.      A 10 mm polyp was found in the ascending colon. The polyp was sessile.       The polyp was removed with a hot snare. Resection and retrieval were       complete.      Three sessile polyps were found in the descending colon and ascending       colon. The polyps were 3 to 6 mm in size. These polyps were removed with       a cold snare. Resection and retrieval were complete.      Multiple diverticula were found in the entire colon.      Non-bleeding internal hemorrhoids were found during retroflexion. The       hemorrhoids were medium-sized. Impression:            -  Hemorrhoids found on perianal exam.                        - One 10 mm polyp in the ascending colon, removed with                         a hot snare. Resected and retrieved.                        - Three 3 to 6 mm polyps in the descending colon and                         in the ascending colon, removed with a cold snare.                         Resected and retrieved.                        - Diverticulosis in the entire examined colon.                        - Non-bleeding internal hemorrhoids. Recommendation:        - Discharge patient to home (with escort).                        - Resume previous diet today.                        - Continue present medications.                        - Await pathology results.                        - Repeat colonoscopy in 5-10 years for surveillance                         based  on pathology results.                        - Return to my office as previously scheduled. Procedure Code(s):     --- Professional ---                        [phone], Colonoscopy, flexible; with removal of                         tumor(s), polyp(s), or other lesion(s) by snare                         technique Diagnosis Code(s):     --- Professional ---                        K63.5, Polyp of colon                        K92.1, Melena (includes Hematochezia)                        K64.8, Other hemorrhoids  K57.30, Diverticulosis of large intestine without                         perforation or abscess without bleeding CPT copyright 2019 American Medical Association. All rights reserved. The codes documented in this report are preliminary and upon coder review may  be revised to meet current compliance requirements. Dr. Ulyess Mort Lin Landsman MD, MD 12/13/2019 11:19:16 AM This report has been signed electronically. Number of Addenda: 0 Note Initiated On: 12/13/2019 10:25 AM Scope Withdrawal Time: 0 hours 8 minutes 6 seconds  Total Procedure Duration: 0 hours 36 minutes 37 seconds  Estimated Blood Loss:  Estimated blood loss: none.      [facility]

## 2019-12-13 NOTE — Anesthesia Preprocedure Evaluation (Signed)
Anesthesia Evaluation  Patient identified by MRN, date of birth, ID band Patient confused    Reviewed: Allergy & Precautions, NPO status , Patient's Chart, lab work & pertinent test results  History of Anesthesia Complications Negative for: history of anesthetic complications  Airway Mallampati: IV       Dental  (+) Missing, Chipped   Pulmonary asthma , sleep apnea ,           Cardiovascular hypertension, + Valvular Problems/Murmurs      Neuro/Psych Anxiety Depression    GI/Hepatic Neg liver ROS, GERD  ,  Endo/Other  Morbid obesity  Renal/GU Renal disease (UTI)     Musculoskeletal   Abdominal   Peds  Hematology negative hematology ROS (+)   Anesthesia Other Findings   Reproductive/Obstetrics                             Anesthesia Physical  Anesthesia Plan  ASA: III and emergent  Anesthesia Plan: General   Post-op Pain Management:    Induction: Intravenous  PONV Risk Score and Plan:   Airway Management Planned: Nasal Cannula  Additional Equipment:   Intra-op Plan:   Post-operative Plan:   Informed Consent: I have reviewed the patients History and Physical, chart, labs and discussed the procedure including the risks, benefits and alternatives for the proposed anesthesia with the patient or authorized representative who has indicated his/her understanding and acceptance.       Plan Discussed with: CRNA and Surgeon  Anesthesia Plan Comments:         Anesthesia Quick Evaluation

## 2019-12-13 NOTE — H&P (Signed)
[name], MD [address]  [place]  [place], [place] [address]  Main: [phone]  Fax: [phone] Pager: [phone]  Primary Care Physician:  [name], MD Primary Gastroenterologist:  Dr. [name]  Pre-Procedure History & Physical: HPI:  Taylor Johnson is a 74 y.o. female is here for an colonoscopy.   Past Medical History:  Diagnosis Date  . Abdominal pain 01/27/2017  . Anxiety   . Anxiety, generalized 05/11/2015  . Cough 06/02/2016   Chronic - followed by Pulmonary  . Depression   . Developmental delay   . Diverticulitis   . Diverticulosis of colon 05/11/2015  . Dysphagia 10/15/2015   Routine Ba Swallow normal; rec modified barium swallow   . GERD (gastroesophageal reflux disease)   . Hypoxia 09/12/2015  . Leg swelling   . Localized edema 04/15/2016  . Microscopic hematuria 04/15/2016  . Nausea and vomiting 01/29/2017  . Orthostatic hypotension 03/16/2017  . OSA on CPAP 11/18/2015   CPAP @ 18 cm H2O started 02/2016  . Rectal bleeding 10/15/2018  . Urge incontinence of urine 05/12/2015    Past Surgical History:  Procedure Laterality Date  . ABDOMINAL HYSTERECTOMY    . BREAST BIOPSY Left    neg  . COLON SURGERY    . ESOPHAGOGASTRODUODENOSCOPY (EGD) WITH PROPOFOL N/A 01/29/2017   Procedure: ESOPHAGOGASTRODUODENOSCOPY (EGD) WITH PROPOFOL;  Surgeon: Wilford Corner, MD;  Location[facility] ENDOSCOPY;  Service: Endoscopy;  Laterality: N/A;    Prior to Admission medications   Medication Sig Start Date End Date Taking? Authorizing Provider  cyclobenzaprine (FLEXERIL) 5 MG tablet Take 1 tablet (5 mg total) by mouth 3 (three) times daily as needed (abdominal muscle spasms). 11/08/19   Ezekiel Slocumb, DO  dicyclomine (BENTYL) 10 MG capsule Take 2 capsules (20 mg total) by mouth 4 (four) times daily -  before meals and at bedtime. 11/15/19   Lin Landsman, MD  hydrALAZINE (APRESOLINE) 25 MG tablet Take 1 tablet (25 mg total) by mouth 3 (three)  times daily. 10/30/19   [name], MD  meclizine (ANTIVERT) 12.5 MG tablet Take 12.5 mg by mouth 3 (three) times daily as needed. 11/01/19   [provider]  metoCLOPramide (REGLAN) 5 MG tablet Take 1 tablet (5 mg total) by mouth 3 (three) times daily before meals. 08/09/19 12/09/19  [name], MD  pantoprazole (PROTONIX) 40 MG tablet Take 1 tablet (40 mg total) by mouth daily. 07/25/19 12/09/19  Menshew, Dannielle Karvonen, PA-C  simethicone (MYLICON) 80 MG chewable tablet Chew 1 tablet (80 mg total) by mouth 4 (four) times daily as needed for flatulence (gas pains). 11/08/19   Ezekiel Slocumb, DO  sulfamethoxazole-trimethoprim (BACTRIM DS) 800-160 MG tablet Take 1 tablet by mouth 2 (two) times daily. 12/02/19   Lawhorn, Lara Mulch, CNM  vitamin B-12 (CYANOCOBALAMIN) 500 MCG tablet Take 500 mcg by mouth daily.    [provider]    Allergies as of 11/15/2019  . (No Known Allergies)    Family History  Problem Relation Age of Onset  . Hypertension Mother   . Kidney disease Mother   . Dementia Mother   . Cancer Father        lung  . Bladder Cancer Neg Hx   . Breast cancer Neg Hx     Social History   Socioeconomic History  . Marital status: Single    Spouse name: Not on file  . Number of children: 0  . Years  of education: Not on file  . Highest education level: 5th grade  Occupational History    Employer: retired  Tobacco Use  . Smoking status: Never Smoker  . Smokeless tobacco: Never Used  . Tobacco comment: smoking cessation materials not required  Substance and Sexual Activity  . Alcohol use: No    Alcohol/week: 0.0 standard drinks  . Drug use: No  . Sexual activity: Never    Birth control/protection: Post-menopausal  Other Topics Concern  . Not on file  Social History Narrative  . Not on file   Social Determinants of Health   Financial Resource Strain: Low Risk   . Difficulty of Paying Living Expenses: Not very hard  Food Insecurity:    . Worried About Charity fundraiser in the Last Year:   . Arboriculturist in the Last Year:   Transportation Needs:   . Film/video editor (Medical):   [name] Lack of Transportation (Non-Medical):   Physical Activity:   . Days of Exercise per Week:   . Minutes of Exercise per Session:   Stress:   . Feeling of Stress :   Social Connections: Unknown  . Frequency of Communication with Friends and Family: Patient refused  . Frequency of Social Gatherings with Friends and Family: Patient refused  . Attends Religious Services: Patient refused  . Active Member of Clubs or Organizations: Patient refused  . Attends Archivist Meetings: Patient refused  . Marital Status: Never married  Intimate Partner Violence: Not At Risk  . Fear of Current or Ex-Partner: No  . Emotionally Abused: No  . Physically Abused: No  . Sexually Abused: No    Review of Systems: See HPI, otherwise negative ROS  Physical Exam: BP (!) 150/67   Pulse 72   Temp 97.9 F (36.6 C) (Temporal)   Resp 17   Ht 5\' 4"  (1.626 m)   Wt 103.7 kg   SpO2 100%   BMI 39.24 kg/m  General:   Alert,  pleasant and cooperative in NAD Head:  Normocephalic and atraumatic. Neck:  Supple; no masses or thyromegaly. Lungs:  Clear throughout to auscultation.    Heart:  Regular rate and rhythm. Abdomen:  Soft, nontender and nondistended. Normal bowel sounds, without guarding, and without rebound.   Neurologic:  Alert and  oriented x4;  grossly normal neurologically.  Impression/Plan: Taylor Johnson is here for an colonoscopy to be performed for hematochezia  Risks, benefits, limitations, and alternatives regarding  colonoscopy have been reviewed with the patient.  Questions have been answered.  All parties agreeable.   [name], MD  12/13/2019, 10:20 AM

## 2019-12-13 NOTE — Transfer of Care (Signed)
Immediate Anesthesia Transfer of Care Note  Patient: Taylor Johnson  Procedure(s) Performed: COLONOSCOPY WITH PROPOFOL (N/A )  Patient Location: PACU  Anesthesia Type:General  Level of Consciousness: sedated  Airway & Oxygen Therapy: Patient Spontanous Breathing and Patient connected to nasal cannula oxygen  Post-op Assessment: Report given to RN and Post -op Vital signs reviewed and stable  Post vital signs: Reviewed and stable  Last Vitals:  Vitals Value Taken Time  BP 157/134 12/13/19 1121  Temp 36.4 C 12/13/19 1120  Pulse 92 12/13/19 1123  Resp 19 12/13/19 1123  SpO2 100 % 12/13/19 1123  Vitals shown include unvalidated device data.  Last Pain:  Vitals:   12/13/19 1120  TempSrc: Temporal  PainSc: Asleep         Complications: No apparent anesthesia complications

## 2019-12-17 LAB — SURGICAL PATHOLOGY

## 2019-12-17 NOTE — Anesthesia Postprocedure Evaluation (Signed)
Anesthesia Post Note  Patient: Taylor Johnson  Procedure(s) Performed: COLONOSCOPY WITH PROPOFOL (N/A )  Patient location during evaluation: Endoscopy Anesthesia Type: General Level of consciousness: awake and alert and oriented Pain management: pain level controlled Vital Signs Assessment: post-procedure vital signs reviewed and stable Respiratory status: spontaneous breathing Cardiovascular status: blood pressure returned to baseline Anesthetic complications: no     Last Vitals:  Vitals:   12/13/19 1145 12/13/19 1150  BP: (!) 164/65   Pulse: 76   Resp: 18 (!) 21  Temp:    SpO2: 94%     Last Pain:  Vitals:   12/14/19 0839  TempSrc:   PainSc: 0-No pain                 Evian Derringer

## 2019-12-18 ENCOUNTER — Telehealth: Payer: Self-pay

## 2019-12-18 ENCOUNTER — Encounter: Payer: Self-pay | Admitting: Gastroenterology

## 2019-12-18 NOTE — Telephone Encounter (Signed)
Patient sister is having a little pinkish color in underwear but no rectal bleeding she states. Sister states she wants her to have this procedure done now. Made appointment for Friday 12/20/2019 at 9:15

## 2019-12-18 NOTE — Telephone Encounter (Signed)
-----   Message from Lin Landsman, MD sent at 12/18/2019 12:48 PM EDT ----- Please schedule a follow-up visit for hemorrhoid ligation if patient is still experiencing rectal bleeding  RV

## 2019-12-20 ENCOUNTER — Ambulatory Visit (INDEPENDENT_AMBULATORY_CARE_PROVIDER_SITE_OTHER): Payer: Medicare Other | Admitting: Gastroenterology

## 2019-12-20 ENCOUNTER — Other Ambulatory Visit: Payer: Self-pay

## 2019-12-20 ENCOUNTER — Encounter: Payer: Self-pay | Admitting: Gastroenterology

## 2019-12-20 VITALS — BP 152/86 | HR 80 | Temp 97.4°F | Ht 64.0 in | Wt 236.1 lb

## 2019-12-20 DIAGNOSIS — K625 Hemorrhage of anus and rectum: Secondary | ICD-10-CM

## 2019-12-20 NOTE — Progress Notes (Signed)

## 2019-12-20 NOTE — Progress Notes (Signed)
[name], MD [address]  [place]  [place], [place] [address]  Main: [phone]  Fax: [phone]    Gastroenterology Consultation  Referring Provider:     [name], MD Primary Care Physician:  [name], MD Primary Gastroenterologist:  Dr. [name] Reason for Consultation: Abdominal pain, rectal bleeding        HPI:   Taylor Johnson is a 74 y.o. y/o female referred by Dr. Army Melia, Jesse Sans, MD  for consultation & management of Abdominal pain and difficulty swallowing. She has known history of severe pandiverticulosis and was recently admitted to [facility] about a month ago with recurrent nausea and vomiting, abdominal pain, CT angiogram of the abdomen did not reveal any bleeding source. There was no evidence of diverticulitis. She was seen by GI, underwent EGD and it was unremarkable. She had a normal barium swallow in March 2017. She was discharged to rehabilitation and subsequently home now for. Per her sister, she has lost about 30 pounds in last 2 months due to nausea, vomiting, abdominal pain and difficulty swallowing solid foods. She is also deconditioned since her discharge and is currently undergoing PT, patient thinks her energy levels are coming back up slowly. She is currently eating only mashed foods. Her sister reports that she has been coughing when she tries to eat solid food, and she feels like food is stuck in her throat. She denies upper abdominal pain, heartburn. She is not having episodes of nausea and vomiting anymore.  With regards to her abdominal pain, patient reports that it is worse at night. She is having one to 2 soft bowel movements daily. She has severe pandiverticulosis based on an endoscopy and abdominal imaging. Denies any blood in the stool. Per her sister, she had several episodes of diverticulitis in the past as well as diverticular bleeding needing blood transfusions.  Follow-up phone visit  12/03/2018 Patient has been doing well.  Patient's sister provided the history.  Patient lives with her sister.  She does not have any concerns today.  Work-up of anemia revealed improving hemoglobin and normal B12, folate and iron panel.  H. pylori breath test returned positive.  She is taking omeprazole 40 mg once a day at present.  She denies any difficulty swallowing.  She denies any other GI symptoms at this time.  Follow-up phone visit 12/24/2018 Taylor Johnson had episode of rectal bleeding last week and her sister was very concerned about it.  She described it as bright red blood per rectum.  Repeat CBC revealed normal hemoglobin.  Patient's sister reported that rectal bleeding subsided and she does not have any abdominal symptoms.  Patient is about to finish triple therapy for H. pylori infection  Follow-up visit 08/05/2019 Taylor Johnson went to [facility] on 07/25/2019 secondary to abdominal pain that started after eating pork meat fat.  She underwent CT abdomen and pelvis which was unremarkable except for colonic diverticulosis.  CBC, lipase and CMP were normal.  Patient's daughter reports that she eats all day long, does eat fried foods every day.  Patient reports having bowel movements every day.  She describes the pain below umbilicus without any radiation.  Patient denies rectal bleeding, nausea vomiting, abdominal bloating  Follow-up visit 11/15/2019 Patient was recently admitted to [facility] secondary to generalized abdominal pain and hematochezia with no evidence of acute anemia.  Patient reports that she has been noticing blood in her depends.  She is not clear  if she has rectal bleeding or vaginal bleeding or blood in her urine.  She reports that her bowel movements have been regular.  She also reports generalized abdominal pain which has been chronic.  She was recommended to try Flexeril during recent admission.  She was on Bentyl in the past.  Her weight has been stable.  Follow-up visit  12/20/2019 Patient reports ongoing mild rectal bleeding.  Patient is accompanied by her sister today.  She underwent colonoscopy which revealed polyps, diverticulosis and hemorrhoids.  Patient is here to discuss about hemorrhoid ligation.  GI Procedures: EGD 01/2017 normal. Colonoscopy 04/2014 showed diverticulosis. Complete report not available and patient reports that it was done at [place]  Colonoscopy 12/13/2019 - Hemorrhoids found on perianal exam. - One 10 mm polyp in the ascending colon, removed with a hot snare. Resected and retrieved. - Three 3 to 6 mm polyps in the descending colon and in the ascending colon, removed with a cold snare. Resected and retrieved. - Diverticulosis in the entire examined colon. - Non-bleeding internal hemorrhoids.  DIAGNOSIS:  A. COLON POLYP X3, ASCENDING; BIOPSY:  - 2 FRAGMENTS OF TUBULAR ADENOMA AND A FRAGMENT OF BENIGN COLONIC  MUCOSA.  - NEGATIVE FOR HIGH-GRADE DYSPLASIA AND MALIGNANCY.   B. COLON POLYP, DESCENDING; BIOPSY:  - FRAGMENTS OF TUBULAR ADENOMA.  - NEGATIVE FOR HIGH-GRADE DYSPLASIA AND MALIGNANCY.     Past Medical History:  Diagnosis Date  . Abdominal pain 01/27/2017  . Anxiety   . Anxiety, generalized 05/11/2015  . Cough 06/02/2016   Chronic - followed by Pulmonary  . Depression   . Developmental delay   . Diverticulitis   . Diverticulosis of colon 05/11/2015  . Dysphagia 10/15/2015   Routine Ba Swallow normal; rec modified barium swallow   . GERD (gastroesophageal reflux disease)   . Hypoxia 09/12/2015  . Leg swelling   . Localized edema 04/15/2016  . Microscopic hematuria 04/15/2016  . Nausea and vomiting 01/29/2017  . Orthostatic hypotension 03/16/2017  . OSA on CPAP 11/18/2015   CPAP @ 18 cm H2O started 02/2016  . Rectal bleeding 10/15/2018  . Urge incontinence of urine 05/12/2015    Past Surgical History:  Procedure Laterality Date  . ABDOMINAL HYSTERECTOMY    . BREAST BIOPSY Left    neg  . COLON SURGERY     . COLONOSCOPY WITH PROPOFOL N/A 12/13/2019   Procedure: COLONOSCOPY WITH PROPOFOL;  Surgeon: Lin Landsman, MD;  Location: [facility] ENDOSCOPY;  Service: Endoscopy;  Laterality: N/A;  . ESOPHAGOGASTRODUODENOSCOPY (EGD) WITH PROPOFOL N/A 01/29/2017   Procedure: ESOPHAGOGASTRODUODENOSCOPY (EGD) WITH PROPOFOL;  Surgeon: Wilford Corner, MD;  Location: [facility] ENDOSCOPY;  Service: Endoscopy;  Laterality: N/A;    Current Outpatient Medications:  .  cyclobenzaprine (FLEXERIL) 5 MG tablet, Take 1 tablet (5 mg total) by mouth 3 (three) times daily as needed (abdominal muscle spasms)., Disp: 30 tablet, Rfl: 0 .  dicyclomine (BENTYL) 10 MG capsule, Take 2 capsules (20 mg total) by mouth 4 (four) times daily -  before meals and at bedtime., Disp: 120 capsule, Rfl: 0 .  hydrALAZINE (APRESOLINE) 25 MG tablet, Take 1 tablet (25 mg total) by mouth 3 (three) times daily., Disp: 90 tablet, Rfl: 5 .  meclizine (ANTIVERT) 12.5 MG tablet, Take 12.5 mg by mouth 3 (three) times daily as needed., Disp: , Rfl:  .  metoCLOPramide (REGLAN) 5 MG tablet, Take 1 tablet (5 mg total) by mouth 3 (three) times daily before meals., Disp: 90 tablet, Rfl: 5 .  pantoprazole (PROTONIX) 40 MG tablet, Take 1 tablet (40 mg total) by mouth daily., Disp: 30 tablet, Rfl: 0 .  simethicone (MYLICON) 80 MG chewable tablet, Chew 1 tablet (80 mg total) by mouth 4 (four) times daily as needed for flatulence (gas pains)., Disp: 30 tablet, Rfl: 0 .  sulfamethoxazole-trimethoprim (BACTRIM DS) 800-160 MG tablet, Take 1 tablet by mouth 2 (two) times daily., Disp: 14 tablet, Rfl: 0 .  vitamin B-12 (CYANOCOBALAMIN) 500 MCG tablet, Take 500 mcg by mouth daily., Disp: , Rfl:    Family History  Problem Relation Age of Onset  . Hypertension Mother   . Kidney disease Mother   . Dementia Mother   . Cancer Father        lung  . Bladder Cancer Neg Hx   . Breast cancer Neg Hx      Social History   Tobacco Use  . Smoking status: Never Smoker  .  Smokeless tobacco: Never Used  . Tobacco comment: smoking cessation materials not required  Substance Use Topics  . Alcohol use: No    Alcohol/week: 0.0 standard drinks  . Drug use: No    Allergies as of 12/20/2019  . (No Known Allergies)    Review of Systems:    All systems reviewed and negative except where noted in HPI.   Physical Exam:  BP (!) 152/86 (BP Location: Left Arm, Patient Position: Sitting, Cuff Size: Normal)   Pulse 80   Temp (!) 97.4 F (36.3 C) (Oral)   Ht 5\' 4"  (1.626 m)   Wt 236 lb 2 oz (107.1 kg)   BMI 40.53 kg/m  No LMP recorded. Patient has had a hysterectomy.  General:   Alert,  Sitting in wheelchair, Well-developed, well-nourished, pleasant and cooperative in NAD Head:  Normocephalic and atraumatic. Eyes:  Sclera clear, no icterus.   Conjunctiva pink. Ears:  Normal auditory acuity. Lungs:  Respirations even and unlabored.  Clear throughout to auscultation.   No wheezes, crackles, or rhonchi. No acute distress. Heart:  Regular rate and rhythm; no murmurs, clicks, rubs, or gallops. Abdomen:  Normal bowel sounds. Obese Soft, non-tender and non-distended without masses, no appreciable hepatosplenomegaly or hernias noted.  No guarding or rebound tenderness.   Rectal: Nor performed Msk:  Symmetrical without gross deformities. Good, equal movement & strength bilaterally. Pulses:  Normal pulses noted. Extremities:  No clubbing or edema.  No cyanosis. Neurologic:  Alert and oriented x3; Skin:  Intact without significant lesions or rashes. No jaundice. Psych:  Alert and cooperative. Normal mood and affect.  Imaging Studies: Reviewed  Assessment and Plan:   FALAN HENSLER is a 74 y.o. 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  Patient had a colonoscopy 2015 which revealed diverticulosis only.  CT abdomen pelvis was unremarkable.   Patient is evaluated by OB/GYN, pelvic ultrasound revealed uterine fibroids.  Colonoscopy revealed diverticulosis and internal hemorrhoids Discussed about hemorrhoid ligation today including risks and benefits.  Patient's sister who is power of attorney consented to proceed with hemorrhoid banding. Continue Bentyl 20 mg as needed up to 4 times a day for abdominal pain   Follow up in 2 to 3 weeks   [name], MD

## 2019-12-24 ENCOUNTER — Other Ambulatory Visit: Payer: Self-pay | Admitting: Gastroenterology

## 2019-12-24 DIAGNOSIS — R1084 Generalized abdominal pain: Secondary | ICD-10-CM

## 2020-01-06 ENCOUNTER — Ambulatory Visit (INDEPENDENT_AMBULATORY_CARE_PROVIDER_SITE_OTHER): Payer: Medicare Other | Admitting: Certified Nurse Midwife

## 2020-01-06 ENCOUNTER — Other Ambulatory Visit (HOSPITAL_COMMUNITY)
Admission: RE | Admit: 2020-01-06 | Discharge: 2020-01-06 | Disposition: A | Discharge status: Home / Self Care | Payer: Medicare Other | Source: Ambulatory Visit | Attending: Certified Nurse Midwife | Admitting: Certified Nurse Midwife

## 2020-01-06 ENCOUNTER — Encounter: Payer: Self-pay | Admitting: Certified Nurse Midwife

## 2020-01-06 ENCOUNTER — Other Ambulatory Visit: Payer: Self-pay

## 2020-01-06 VITALS — BP 133/62 | HR 74 | Ht 64.0 in | Wt 236.3 lb

## 2020-01-06 DIAGNOSIS — N950 Postmenopausal bleeding: Secondary | ICD-10-CM | POA: Insufficient documentation

## 2020-01-08 LAB — SURGICAL PATHOLOGY

## 2020-01-08 NOTE — Progress Notes (Signed)
This is my second attempt unable to get adequate sample, can not advance catheter greater than 4 cm without meeting resistance. Found to have three (3) polyps during GI workup. No further episodes of bleeding. Next steps? JML

## 2020-01-09 ENCOUNTER — Other Ambulatory Visit: Payer: Self-pay | Admitting: Internal Medicine

## 2020-01-09 ENCOUNTER — Ambulatory Visit (INDEPENDENT_AMBULATORY_CARE_PROVIDER_SITE_OTHER): Payer: Medicare Other | Admitting: Gastroenterology

## 2020-01-09 ENCOUNTER — Other Ambulatory Visit: Payer: Self-pay

## 2020-01-09 ENCOUNTER — Encounter: Payer: Self-pay | Admitting: Gastroenterology

## 2020-01-09 VITALS — BP 153/75 | HR 61 | Temp 97.6°F | Wt 241.2 lb

## 2020-01-09 DIAGNOSIS — K629 Disease of anus and rectum, unspecified: Secondary | ICD-10-CM

## 2020-01-09 DIAGNOSIS — B079 Viral wart, unspecified: Secondary | ICD-10-CM

## 2020-01-09 NOTE — Progress Notes (Signed)
PROCEDURE NOTE: The patient presents with symptomatic grade 1 hemorrhoids, unresponsive to maximal medical therapy, requesting rubber band ligation of his/her hemorrhoidal disease.  All risks, benefits and alternative forms of therapy were described and informed consent was obtained.  The decision was made to band the LL internal hemorrhoid, and the [place] was used to perform band ligation without complication.  Digital anorectal examination was then performed to assure proper positioning of the band, and to adjust the banded tissue as required.  The patient was discharged home without pain or other issues.  Dietary and behavioral recommendations were given and (if necessary - prescriptions were given), along with follow-up instructions.  The patient will return  as needed for follow-up and possible additional banding as required.  No complications were encountered and the patient tolerated the procedure well.  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  Will refer her to general surgery for removal  [name], MD [address]  [place]  [place], [place] [address]  Main: [phone]  Fax: [phone] Pager: [phone]

## 2020-01-09 NOTE — Telephone Encounter (Signed)
Requested medication (s) are due for refill today: Yes  Requested medication (s) are on the active medication list: Yes  Last refill:  11/04/19  Future visit scheduled: No  Notes to clinic:  Historical provider.    Requested Prescriptions  Pending Prescriptions Disp Refills   meclizine (ANTIVERT) 12.5 MG tablet [Pharmacy Med Name: meclizine 12.5 mg tablet] 30 tablet 5    Sig: TAKE ONE TABLET BY MOUTH THREE TIMES DAILY AS NEEDED FOR DIZZINESS      Not Delegated - Gastroenterology: Antiemetics Failed - 01/09/2020  8:00 AM      Failed - This refill cannot be delegated      Passed - Valid encounter within last 6 months    Recent Outpatient Visits           3 months ago Benign essential HTN   [facility] [name], MD   10 months ago Benign essential HTN   [facility] [name], MD   11 months ago Gastritis without bleeding, unspecified chronicity, unspecified gastritis type   [facility] [name], MD   1 year ago Rectal bleeding   [facility] [name], MD   1 year ago Gastritis without bleeding, unspecified chronicity, unspecified gastritis type   [facility] [name], MD       Future Appointments             Today Lin Landsman, MD [facility]

## 2020-01-10 NOTE — Progress Notes (Signed)
Please contact patient or family members with options from Dr. Marcelline Mates since unable to obtain adequate specimen after second attempt.    1. MRI to better characterize endometrium  2. Waiting three (3) to six (6) months to see if bleeding happens again after removal of GI polyps  3. Endosee in the office or OR to dilate cervix and sample endometrium with MD  Thanks, JML

## 2020-01-12 NOTE — Progress Notes (Signed)
Endometrial Biopsy Note:   A timeout protocol was performed prior to initiating the procedure. Verbal consent was obtained to completed endometrial biopsy.   Vaginal speculum was inserted, and the cervix was visualized.   The cervix was cleansed with antiseptic solution and grasped with tenaculum.   The device was inserted through the cervical canal, into the uterine cavity with resistance being met after 4 cms.   The depth of the uterus was not determined with the instrument.   The instrument was withdrawn as it was rotated 3-4 times to obtain the sample which was sent in formalin to pathology   Followup: The patient tolerated the procedure well without complications. Standard post-procedure care is explained and return precautions are given.   Diona Fanti, CNM [facility], [facility] 01/12/20 11:57 AM

## 2020-01-13 ENCOUNTER — Telehealth: Payer: Self-pay

## 2020-01-13 NOTE — Telephone Encounter (Signed)
Spoke with Taylor Johnson- 3 options given per [name] CNM. Taylor Johnson would like to wait for 3-6 months to see if the bleeding happens again. She was strongly encouraged to reach out to [name] if anything changes or the bleeding resumes.

## 2020-01-16 ENCOUNTER — Other Ambulatory Visit: Payer: Self-pay

## 2020-01-16 ENCOUNTER — Encounter: Payer: Self-pay | Admitting: General Surgery

## 2020-01-16 ENCOUNTER — Ambulatory Visit (INDEPENDENT_AMBULATORY_CARE_PROVIDER_SITE_OTHER): Payer: Medicare Other | Admitting: General Surgery

## 2020-01-16 VITALS — BP 150/80 | HR 70 | Temp 97.5°F | Resp 14 | Wt 237.2 lb

## 2020-01-16 DIAGNOSIS — A630 Anogenital (venereal) warts: Secondary | ICD-10-CM

## 2020-01-16 NOTE — Progress Notes (Signed)
Patient ID: [name], female   DOB: 01/03/1946, 74 y.o.   MRN: [id number]  Chief Complaint  Patient presents with  . New Patient (Initial Visit)    perianal lesion    HPI Taylor Johnson is a 74 y.o. female.   She is here today, accompanied by her sister who provides the majority of the medical history.  She was referred by Dr. Marius Ditch for further evaluation of a soft tissue lesion on the patient's buttocks.  Dr. Marius Ditch had been seeing her for hemorrhoids.  When she was there having a banding procedure, Dr. Terri Piedra noticed a bleeding pedunculated soft tissue lesion on the patient's left buttock adjacent to the natal cleft.  She has been referred to general surgery for further evaluation and management.  Taylor Johnson is a challenging historian due to her developmental delay; her sister aids in providing more history.  The history is also supplemented by review of the electronic medical record.  It appears that Taylor Johnson has been evaluated for dysfunctional uterine bleeding as well as rectal bleeding and has presented to the emergency department on a couple of occasions for evaluation.  Ultimately, she was seen by gastroenterology who performed colonoscopy and hemorrhoidal banding.  During a banding session at the end of last month, Dr. Marius Ditch noticed the lesion on Taylor Johnson's skin and sent her here to have it removed.  Taylor Johnson denies any pain in the area.  She is unable to specify or quantify whether it bleeds frequently or how much bleeding occurs.   Past Medical History:  Diagnosis Date  . Abdominal pain 01/27/2017  . Anxiety   . Anxiety, generalized 05/11/2015  . Cough 06/02/2016   Chronic - followed by Pulmonary  . Depression   . Developmental delay   . Diverticulitis   . Diverticulosis of colon 05/11/2015  . Dysphagia 10/15/2015   Routine Ba Swallow normal; rec modified barium swallow   . GERD (gastroesophageal reflux disease)   . Hypoxia 09/12/2015  . Leg swelling   . Localized edema  04/15/2016  . Microscopic hematuria 04/15/2016  . Nausea and vomiting 01/29/2017  . Orthostatic hypotension 03/16/2017  . OSA on CPAP 11/18/2015   CPAP @ 18 cm H2O started 02/2016  . Rectal bleeding 10/15/2018  . Urge incontinence of urine 05/12/2015    Past Surgical History:  Procedure Laterality Date  . ABDOMINAL HYSTERECTOMY    . BREAST BIOPSY Left    neg  . COLON SURGERY    . COLONOSCOPY WITH PROPOFOL N/A 12/13/2019   Procedure: COLONOSCOPY WITH PROPOFOL;  Surgeon: Lin Landsman, MD;  Location: [facility] ENDOSCOPY;  Service: Endoscopy;  Laterality: N/A;  . ESOPHAGOGASTRODUODENOSCOPY (EGD) WITH PROPOFOL N/A 01/29/2017   Procedure: ESOPHAGOGASTRODUODENOSCOPY (EGD) WITH PROPOFOL;  Surgeon: Wilford Corner, MD;  Location: [facility] ENDOSCOPY;  Service: Endoscopy;  Laterality: N/A;    Family History  Problem Relation Age of Onset  . Hypertension Mother   . Kidney disease Mother   . Dementia Mother   . Cancer Father        lung  . Bladder Cancer Neg Hx   . Breast cancer Neg Hx     Social History Social History   Tobacco Use  . Smoking status: Never Smoker  . Smokeless tobacco: Never Used  . Tobacco comment: smoking cessation materials not required  Vaping Use  . Vaping Use: Never used  Substance Use Topics  . Alcohol use: No    Alcohol/week: 0.0 standard drinks  . Drug use:  No    No Known Allergies  Current Outpatient Medications  Medication Sig Dispense Refill  . cyclobenzaprine (FLEXERIL) 5 MG tablet Take 1 tablet (5 mg total) by mouth 3 (three) times daily as needed (abdominal muscle spasms). 30 tablet 0  . dicyclomine (BENTYL) 10 MG capsule TAKE TWO CAPSULES BY MOUTH FOUR TIMES DAILY (BEFORE MEALS & AT BEDTIME) 120 capsule 0  . hydrALAZINE (APRESOLINE) 25 MG tablet Take 1 tablet (25 mg total) by mouth 3 (three) times daily. 90 tablet 5  . meclizine (ANTIVERT) 12.5 MG tablet TAKE ONE TABLET BY MOUTH THREE TIMES DAILY AS NEEDED FOR DIZZINESS 30 tablet 5  . simethicone  (MYLICON) 80 MG chewable tablet Chew 1 tablet (80 mg total) by mouth 4 (four) times daily as needed for flatulence (gas pains). 30 tablet 0  . vitamin B-12 (CYANOCOBALAMIN) 500 MCG tablet Take 500 mcg by mouth daily.    . metoCLOPramide (REGLAN) 5 MG tablet Take 1 tablet (5 mg total) by mouth 3 (three) times daily before meals. 90 tablet 5  . pantoprazole (PROTONIX) 40 MG tablet Take 1 tablet (40 mg total) by mouth daily. 30 tablet 0   No current facility-administered medications for this visit.    Review of Systems Review of Systems  Unable to perform ROS: Other  Developmental delay  Blood pressure (!) 150/80, pulse 70, temperature (!) 97.5 F (36.4 C), resp. rate 14, weight 237 lb 3.2 oz (107.6 kg), SpO2 98 %. Body mass index is 40.72 kg/m.  Physical Exam Physical Exam Vitals reviewed. Exam conducted with a chaperone present.  Constitutional:      General: She is not in acute distress.    Appearance: She is obese.  HENT:     Head: Normocephalic and atraumatic.     Nose: Nose normal.     Mouth/Throat:     Comments: Covered with a mask Eyes:     General: No scleral icterus.       Right eye: No discharge.        Left eye: No discharge.  Neck:     Comments: No palpable cervical or supraclavicular lymphadenopathy.  The trachea is midline.  There are no dominant thyroid masses or thyromegaly appreciated. Cardiovascular:     Rate and Rhythm: Normal rate and regular rhythm.  Pulmonary:     Effort: Pulmonary effort is normal.     Breath sounds: Normal breath sounds.  Abdominal:     General: Bowel sounds are normal.     Palpations: Abdomen is soft.  Genitourinary:      Comments: There is a multilobulated, pink pedunculated lesion on the patient's left buttock just inside the natal cleft.  It is not tender but bleeds easily with any manipulation. Musculoskeletal:     Right lower leg: Edema present.     Left lower leg: Edema present.  Skin:    General: Skin is warm and dry.   Neurological:     Mental Status: She is alert. Mental status is at baseline.  Psychiatric:        Mood and Affect: Mood normal.        Behavior: Behavior normal.     Data Reviewed I reviewed the several emergency department visit notes for GI bleeding that occurred in April and May of this year.  I reviewed the colonoscopy done on May 28 that showed hemorrhoids, polyps, and diverticulosis.  I reviewed Dr. Verlin Grills clinic notes from June 4 and June 24 as well as the banding procedure  notes.  I agree with Dr. Verlin Grills assessment that this lesion appears consistent with a papilloma.  Assessment This is a 74 year old developmentally delayed woman who was being treated for bleeding internal hemorrhoids by gastroenterology, when a soft tissue lesion was appreciated.  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  Plan Consent was obtained from the patient's sister to remove the lesion.  The area was cleaned with chlorhexidine.  1% lidocaine with epinephrine was infiltrated around the base of the lesion.  The lesion was grasped with forceps and excised with a scalpel.  Bleeding was controlled with pressure and silver nitrate sticks.  The lesion was sent for pathology.  The patient tolerated the procedure well.  Wound care instructions were provided.  We will see this patient on an as-needed basis.    Fredirick Maudlin 01/16/2020, 12:19 PM

## 2020-01-16 NOTE — Patient Instructions (Signed)
We have removed a wart from your body today. Place a bandaide as needed to help with oozing. You may shower as you normally do.   Follow up as needed. Call the office if you have any questions or concerns.   Excision of Skin Cysts or Lesions Excision of a skin lesion refers to the removal of a section of skin by making small cuts (incisions) in the skin. This procedure may be done to remove a cancerous (malignant) or noncancerous (benign) growth on the skin. It is typically done to treat or prevent cancer or infection. It may also be done to improve cosmetic appearance. The procedure may be done to remove:  Cancerous growths, such as basal cell carcinoma, squamous cell carcinoma, or melanoma.  Noncancerous growths, such as a cyst or lipoma.  Growths, such as moles or skin tags, which may be removed for cosmetic reasons.  Various excision or surgical techniques may be used depending on your condition, the location of the lesion, and your overall health. Tell a health care provider about:  Any allergies you have.  All medicines you are taking, including vitamins, herbs, eye drops, creams, and over-the-counter medicines.  Any problems you or family members have had with anesthetic medicines.  Any blood disorders you have.  Any surgeries you have had.  Any medical conditions you have.  Whether you are pregnant or may be pregnant. What are the risks? Generally, this is a safe procedure. However, problems may occur, including:  Bleeding.  Infection.  Scarring.  Recurrence of the cyst, lipoma, or cancer.  Changes in skin sensation or appearance, such as discoloration or swelling.  Reaction to the anesthetics.  Allergic reaction to surgical materials or ointments.  Damage to nerves, blood vessels, muscles, or other structures.  Continued pain.  What happens before the procedure?  Ask your health care provider about: ? Changing or stopping your regular medicines. This is  especially important if you are taking diabetes medicines or blood thinners. ? Taking medicines such as aspirin and ibuprofen. These medicines can thin your blood. Do not take these medicines before your procedure if your health care provider instructs you not to.  You may be asked to take certain medicines.  You may be asked to stop smoking.  You may have an exam or testing.  Plan to have someone take you home after the procedure.  Plan to have someone help you with activities during recovery. What happens during the procedure?  To reduce your risk of infection: ? Your health care team will wash or sanitize their hands. ? Your skin will be washed with soap.  You will be given a medicine to numb the area (local anesthetic).  One of the following excision techniques will be performed.  At the end of any of these procedures, antibiotic ointment will be applied as needed. Each of the following techniques may vary among health care providers and hospitals. Complete Surgical Excision The area of skin that needs to be removed will be marked with a pen. Using a small scalpel or scissors, the surgeon will gently cut around and under the lesion until it is completely removed. The lesion will be placed in a fluid and sent to the lab for examination. If necessary, bleeding will be controlled with a device that delivers heat (electrocautery). The edges of the wound may be stitched (sutured) together, and a bandage (dressing) will be applied. This procedure may be performed to treat a cancerous growth or a noncancerous cyst or  lesion. Excision of a Cyst The surgeon will make an incision on the cyst. The entire cyst will be removed through the incision. The incision may be closed with sutures. Shave Excision During shave excision, the surgeon will use a small blade or an electrically heated loop instrument to shave off the lesion. This may be done to remove a mole or a skin tag. The wound will usually  be left to heal on its own without sutures. Punch Excision During punch excision, the surgeon will use a small tool that is like a cookie cutter or a hole punch to cut a circle shape out of the skin. The outer edges of the skin will be sutured together. This may be done to remove a mole or a scar or to perform a biopsy of the lesion. Mohs Micrographic Surgery During Mohs micrographic surgery, layers of the lesion will be removed with a scalpel or a loop instrument and will be examined right away under a microscope. Layers will be removed until all of the abnormal or cancerous tissue has been removed. This procedure is minimally invasive, and it ensures the best cosmetic outcome. It involves the removal of as little normal tissue as possible. Mohs is usually done to treat skin cancer, such as basal cell carcinoma or squamous cell carcinoma, particularly on the face and ears. Depending on the size of the surgical wound, it may be sutured closed. What happens after the procedure?  Return to your normal activities as told by your health care provider.  Talk with your health care provider to discuss any test results, treatment options, and if necessary, the need for more tests. This information is not intended to replace advice given to you by your health care provider. Make sure you discuss any questions you have with your health care provider. Document Released: 09/28/2009 Document Revised: 12/10/2015 Document Reviewed: 08/20/2014 Elsevier Interactive Patient Education  [employer].

## 2020-01-23 ENCOUNTER — Other Ambulatory Visit: Payer: Self-pay | Admitting: Gastroenterology

## 2020-01-23 ENCOUNTER — Other Ambulatory Visit: Payer: Self-pay | Admitting: Internal Medicine

## 2020-01-23 DIAGNOSIS — R1084 Generalized abdominal pain: Secondary | ICD-10-CM

## 2020-01-23 NOTE — Telephone Encounter (Signed)
Called pt let her know that she had RFs that she needed to call the pharmacy and see if its ready. Pt verbalized understanding.  KP

## 2020-02-06 ENCOUNTER — Other Ambulatory Visit: Payer: Self-pay | Admitting: Internal Medicine

## 2020-02-06 DIAGNOSIS — K2970 Gastritis, unspecified, without bleeding: Secondary | ICD-10-CM

## 2020-02-06 NOTE — Telephone Encounter (Signed)
Requested medication (s) are due for refill today: yes  Requested medication (s) are on the active medication list: yes  Last refill:  08/09/19  #90  5 refills  Future visit scheduled: no  Notes to clinic:  Med not delegated    Requested Prescriptions  Pending Prescriptions Disp Refills   metoCLOPramide (REGLAN) 5 MG tablet [Pharmacy Med Name: metoclopramide 5 mg tablet] 90 tablet 5    Sig: TAKE ONE TABLET BY MOUTH [place]      Not Delegated - Gastroenterology: Antiemetics Failed - 02/06/2020  2:20 PM      Failed - This refill cannot be delegated      Passed - Valid encounter within last 6 months    Recent Outpatient Visits           4 months ago Benign essential HTN   [facility] [name], MD   11 months ago Benign essential HTN   [facility] [name], MD   1 year ago Gastritis without bleeding, unspecified chronicity, unspecified gastritis type   [facility] [name], MD   1 year ago Rectal bleeding   [facility] [name], MD   1 year ago Gastritis without bleeding, unspecified chronicity, unspecified gastritis type   [facility] [name], MD

## 2020-02-11 ENCOUNTER — Other Ambulatory Visit: Payer: Self-pay | Admitting: Gastroenterology

## 2020-02-11 DIAGNOSIS — R1084 Generalized abdominal pain: Secondary | ICD-10-CM

## 2020-03-03 ENCOUNTER — Other Ambulatory Visit: Payer: Self-pay | Admitting: Gastroenterology

## 2020-03-03 DIAGNOSIS — R1084 Generalized abdominal pain: Secondary | ICD-10-CM

## 2020-03-17 ENCOUNTER — Other Ambulatory Visit: Payer: Self-pay | Admitting: Internal Medicine

## 2020-03-17 DIAGNOSIS — R030 Elevated blood-pressure reading, without diagnosis of hypertension: Secondary | ICD-10-CM

## 2020-04-02 ENCOUNTER — Other Ambulatory Visit: Payer: Self-pay | Admitting: Gastroenterology

## 2020-04-02 DIAGNOSIS — R1084 Generalized abdominal pain: Secondary | ICD-10-CM

## 2020-04-02 NOTE — Telephone Encounter (Signed)
Last office visit 01/09/2020 lesion of perianal

## 2020-05-11 ENCOUNTER — Other Ambulatory Visit: Payer: Self-pay | Admitting: Gastroenterology

## 2020-05-11 ENCOUNTER — Other Ambulatory Visit: Payer: Self-pay | Admitting: Internal Medicine

## 2020-05-11 DIAGNOSIS — R030 Elevated blood-pressure reading, without diagnosis of hypertension: Secondary | ICD-10-CM

## 2020-05-11 DIAGNOSIS — R1084 Generalized abdominal pain: Secondary | ICD-10-CM

## 2020-05-20 ENCOUNTER — Ambulatory Visit: Payer: Medicare Other

## 2020-05-25 ENCOUNTER — Ambulatory Visit (INDEPENDENT_AMBULATORY_CARE_PROVIDER_SITE_OTHER): Payer: Medicare Other

## 2020-05-25 ENCOUNTER — Other Ambulatory Visit: Payer: Self-pay

## 2020-05-25 VITALS — BP 140/78 | HR 74 | Temp 97.5°F | Resp 16 | Ht 64.0 in | Wt 251.0 lb

## 2020-05-25 DIAGNOSIS — Z1231 Encounter for screening mammogram for malignant neoplasm of breast: Secondary | ICD-10-CM

## 2020-05-25 DIAGNOSIS — Z780 Asymptomatic menopausal state: Secondary | ICD-10-CM | POA: Diagnosis not present

## 2020-05-25 DIAGNOSIS — Z0000 Encounter for general adult medical examination without abnormal findings: Secondary | ICD-10-CM

## 2020-05-25 NOTE — Progress Notes (Signed)
Subjective:   Taylor Johnson is a 74 y.o. female who presents for Medicare Annual (Subsequent) preventive examination.  Review of Systems     Cardiac Risk Factors include: advanced age (>16men, >4 women);hypertension;sedentary lifestyle;obesity (BMI >30kg/m2)     Objective:    Today's Vitals   05/25/20 1447  BP: 140/78  Pulse: 74  Resp: 16  Temp: (!) 97.5 F (36.4 C)  TempSrc: Oral  SpO2: 94%  Weight: 251 lb (113.9 kg)  Height: 5\' 4"  (1.626 m)   Body mass index is 43.08 kg/m.  Advanced Directives 05/25/2020 12/13/2019 11/07/2019 11/07/2019 11/04/2019 09/24/2019 07/25/2019  Does Patient Have a Medical Advance Directive? Yes No No No No No No  Type of Paramedic of [place];Living will - - - - - -  Copy of [place] in Chart? Yes - validated most recent copy scanned in chart (See row information) - - - - - -  Would patient like information on creating a medical advance directive? - No - Patient declined No - Patient declined No - Patient declined - No - Patient declined No - Patient declined    Current Medications (verified) Outpatient Encounter Medications as of 05/25/2020  Medication Sig  . dicyclomine (BENTYL) 10 MG capsule TAKE TWO CAPSULES BY MOUTH FOUR TIMES DAILY  . hydrALAZINE (APRESOLINE) 25 MG tablet TAKE ONE TABLET BY MOUTH THREE TIMES DAILY  . metoCLOPramide (REGLAN) 5 MG tablet TAKE ONE TABLET BY MOUTH THREE TIMES DAILY BEFORE MEALS  . meclizine (ANTIVERT) 12.5 MG tablet TAKE ONE TABLET BY MOUTH THREE TIMES DAILY AS NEEDED FOR DIZZINESS (Patient not taking: Reported on 05/25/2020)  . [DISCONTINUED] cyclobenzaprine (FLEXERIL) 5 MG tablet Take 1 tablet (5 mg total) by mouth 3 (three) times daily as needed (abdominal muscle spasms).  . [DISCONTINUED] pantoprazole (PROTONIX) 40 MG tablet Take 1 tablet (40 mg total) by mouth daily.  . [DISCONTINUED] simethicone (MYLICON) 80 MG chewable tablet Chew 1 tablet (80 mg total) by mouth 4  (four) times daily as needed for flatulence (gas pains).  . [DISCONTINUED] vitamin B-12 (CYANOCOBALAMIN) 500 MCG tablet Take 500 mcg by mouth daily.   No facility-administered encounter medications on file as of 05/25/2020.    Allergies (verified) Patient has no known allergies.   History: Past Medical History:  Diagnosis Date  . Abdominal pain 01/27/2017  . Anxiety   . Anxiety, generalized 05/11/2015  . Cough 06/02/2016   Chronic - followed by Pulmonary  . Depression   . Developmental delay   . Diverticulitis   . Diverticulosis of colon 05/11/2015  . Dysphagia 10/15/2015   Routine Ba Swallow normal; rec modified barium swallow   . GERD (gastroesophageal reflux disease)   . Hypoxia 09/12/2015  . Leg swelling   . Localized edema 04/15/2016  . Microscopic hematuria 04/15/2016  . Nausea and vomiting 01/29/2017  . Orthostatic hypotension 03/16/2017  . OSA on CPAP 11/18/2015   CPAP @ 18 cm H2O started 02/2016  . Rectal bleeding 10/15/2018  . Urge incontinence of urine 05/12/2015   Past Surgical History:  Procedure Laterality Date  . ABDOMINAL HYSTERECTOMY    . BREAST BIOPSY Left    neg  . COLON SURGERY    . COLONOSCOPY WITH PROPOFOL N/A 12/13/2019   Procedure: COLONOSCOPY WITH PROPOFOL;  Surgeon: Lin Landsman, MD;  Location[facility] ENDOSCOPY;  Service: Endoscopy;  Laterality: N/A;  . ESOPHAGOGASTRODUODENOSCOPY (EGD) WITH PROPOFOL N/A 01/29/2017   Procedure: ESOPHAGOGASTRODUODENOSCOPY (EGD) WITH PROPOFOL;  Surgeon: Wilford Corner,  MD;  Location: [facility] ENDOSCOPY;  Service: Endoscopy;  Laterality: N/A;   Family History  Problem Relation Age of Onset  . Hypertension Mother   . Kidney disease Mother   . Dementia Mother   . Cancer Father        lung  . Bladder Cancer Neg Hx   . Breast cancer Neg Hx    Social History   Socioeconomic History  . Marital status: Single    Spouse name: Not on file  . Number of children: 0  . Years of education: Not on file  . Highest education  level: 5th grade  Occupational History    Employer: retired  Tobacco Use  . Smoking status: Never Smoker  . Smokeless tobacco: Never Used  . Tobacco comment: smoking cessation materials not required  Vaping Use  . Vaping Use: Never used  Substance and Sexual Activity  . Alcohol use: No    Alcohol/week: 0.0 standard drinks  . Drug use: No  . Sexual activity: Never    Birth control/protection: Post-menopausal  Other Topics Concern  . Not on file  Social History Narrative   Pt lives with her niece Katherene Dinino   Social Determinants of Health   Financial Resource Strain: Low Risk   . Difficulty of Paying Living Expenses: Not very hard  Food Insecurity: No Food Insecurity  . Worried About Charity fundraiser in the Last Year: Never true  . Ran Out of Food in the Last Year: Never true  Transportation Needs: No Transportation Needs  . Lack of Transportation (Medical): No  . Lack of Transportation (Non-Medical): No  Physical Activity: Inactive  . Days of Exercise per Week: 0 days  . Minutes of Exercise per Session: 0 min  Stress: Stress Concern Present  . Feeling of Stress : Very much  Social Connections: Socially Isolated  . Frequency of Communication with Friends and Family: More than three times a week  . Frequency of Social Gatherings with Friends and Family: More than three times a week  . Attends Religious Services: Never  . Active Member of Clubs or Organizations: No  . Attends Archivist Meetings: Never  . Marital Status: Never married    Tobacco Counseling Counseling given: Not Answered Comment: smoking cessation materials not required   Clinical Intake:  Pre-visit preparation completed: Yes  Pain : No/denies pain     BMI - recorded: 43.08 Nutritional Status: BMI > 30  Obese Nutritional Risks: Nausea/ vomitting/ diarrhea (loose bowel movements)  How often do you need to have someone help you when you read instructions, pamphlets, or other  written materials from your doctor or pharmacy?: 1 - Never    Interpreter Needed?: No  Information entered by :: Clemetine Marker LPN   Activities of Daily Living In your present state of health, do you have any difficulty performing the following activities: 05/25/2020 11/07/2019  Hearing? N N  Comment declines hearing aids -  Vision? N N  Difficulty concentrating or making decisions? N N  Walking or climbing stairs? Y N  Dressing or bathing? N N  Doing errands, shopping? Tempie Donning  Preparing Food and eating ? N -  Using the Toilet? N -  In the past six months, have you accidently leaked urine? Y -  Comment wears depends -  Do you have problems with loss of bowel control? Y -  Managing your Medications? Y -  Managing your Finances? Y -  Housekeeping or managing your Housekeeping? Y -  Some recent data might be hidden    Patient Care Team: [name], MD as PCP - General (Internal Medicine) Lin Landsman, MD as Consulting Physician (Gastroenterology)  Indicate any recent Medical Services you may have received from other than [facility] providers in the past year (date may be approximate).     Assessment:   This is a routine wellness examination for [facility].  Hearing/Vision screen  Hearing Screening   125Hz  250Hz  500Hz  1000Hz  2000Hz  3000Hz  4000Hz  6000Hz  8000Hz   Right ear:           Left ear:           Comments: Pt denies hearing difficulty   Vision Screening Comments: Past due for eye exam at [facility]  Dietary issues and exercise activities discussed: Current Exercise Habits: The patient does not participate in regular exercise at present, Exercise limited by: orthopedic condition(s)  Goals    . DIET - INCREASE WATER INTAKE     Recommend drinking 6-8 glasses of water per day     . Prevent falls     Recommend to remove any items from the home that may cause slips or trips.      Depression Screen PHQ 2/9 Scores 05/25/2020 09/18/2019 05/20/2019 01/21/2019 10/23/2018  03/05/2018 06/02/2016  PHQ - 2 Score 3 2 0 0 0 2 0  PHQ- 9 Score 3 6 - - - 6 -    Fall Risk Fall Risk  05/25/2020 05/20/2019 03/04/2019 01/21/2019 10/23/2018  Falls in the past year? 0 0 0 0 0  Number falls in past yr: 0 0 0 0 0  Injury with Fall? 0 0 0 0 0  Risk for fall due to : Impaired balance/gait History of fall(s);Impaired balance/gait - - -  Follow up Falls prevention discussed Falls prevention discussed - Falls evaluation completed Falls evaluation completed    Any stairs in or around the home? Yes  If so, are there any without handrails? Yes  - steps outside  Home free of loose throw rugs in walkways, pet beds, electrical cords, etc? No  Adequate lighting in your home to reduce risk of falls? No   ASSISTIVE DEVICES UTILIZED TO PREVENT FALLS:  Life alert? No  Use of a cane, walker or w/c? Yes  Grab bars in the bathroom? Yes  Shower chair or bench in shower? Yes  Elevated toilet seat or a handicapped toilet? Yes   TIMED UP AND GO:  Was the test performed? Yes .  Length of time to ambulate 10 feet: 6 sec.   Gait slow and steady with assistive device  Cognitive Function:     6CIT Screen 05/25/2020 05/20/2019 03/05/2018 06/02/2016  What Year? 4 points 4 points 4 points 4 points  What month? 0 points 0 points 0 points 0 points  What time? 0 points 0 points 0 points 0 points  Count back from 20 4 points 4 points 2 points 4 points  Months in reverse 4 points 4 points 4 points 4 points  Repeat phrase 10 points 10 points 6 points 4 points  Total Score 22 22 16 16     Immunizations Immunization History  Administered Date(s) Administered  . Influenza Inj Mdck Quad Pf 05/08/2019  . Influenza, High Dose Seasonal PF 04/13/2018, 04/24/2020  . Influenza,inj,Quad PF,6+ Mos 05/12/2015, 04/05/2016, 03/16/2017  . Influenza-Unspecified 05/10/2019  . Moderna SARS-COVID-2 Vaccination 08/22/2019, 09/19/2019  . Pneumococcal Conjugate-13 04/05/2016  . Pneumococcal Polysaccharide-23  01/06/2014, 09/13/2015  . Zoster Recombinat (Shingrix) 05/08/2019  TDAP status: Due, Education has been provided regarding the importance of this vaccine. Advised may receive this vaccine at local pharmacy or Health Dept. Aware to provide a copy of the vaccination record if obtained from local pharmacy or Health Dept. Verbalized acceptance and understanding.   Flu Vaccine status: Up to date per patient; will call [facility] for date of vaccine  Pneumococcal vaccine status: Up to date   Covid-19 vaccine status: Completed vaccines  Qualifies for Shingles Vaccine? Yes   Zostavax completed No   Shingrix Completed?: Yes  Screening Tests Health Maintenance  Topic Date Due  . TETANUS/TDAP  Never done  . MAMMOGRAM  06/02/2020  . COLONOSCOPY  12/13/2022  . INFLUENZA VACCINE  Completed  . DEXA SCAN  Completed  . COVID-19 Vaccine  Completed  . Hepatitis C Screening  Completed  . PNA vac Low Risk Adult  Completed    Health Maintenance  Health Maintenance Due  Topic Date Due  . TETANUS/TDAP  Never done    Colorectal cancer screening: Completed 12/13/19. Repeat every 3 years   Mammogram status: Completed 06/03/19. Repeat every year   Bone Density status: Completed 05/07/18. Results reflect: Bone density results: OSTEOPENIA. Repeat every 2 years.  Lung Cancer Screening: (Low Dose CT Chest recommended if Age 48-80 years, 30 pack-year currently smoking OR have quit w/in 15years.) does not qualify.   Additional Screening:  Hepatitis C Screening: does qualify; Completed 06/02/16  Vision Screening: Recommended annual ophthalmology exams for early detection of glaucoma and other disorders of the eye. Is the patient up to date with their annual eye exam?  No  Who is the provider or what is the name of the office in which the patient attends annual eye exams? [place] Screening: Recommended annual dental exams for proper oral hygiene  Community Resource Referral  / Chronic Care Management: CRR required this visit?  No   CCM required this visit?  No      Plan:     I have personally reviewed and noted the following in the patient's chart:   . Medical and social history . Use of alcohol, tobacco or illicit drugs  . Current medications and supplements . Functional ability and status . Nutritional status . Physical activity . Advanced directives . List of other physicians . Hospitalizations, surgeries, and ER visits in previous 12 months . Vitals . Screenings to include cognitive, depression, and falls . Referrals and appointments  In addition, I have reviewed and discussed with patient certain preventive protocols, quality metrics, and best practice recommendations. A written personalized care plan for preventive services as well as general preventive health recommendations were provided to patient.     Clemetine Marker, LPN   [id number]/02/6577   Nurse Notes: patient accompanied to visit today by her sister Callie. Advised to schedule appt with Dr. Army Melia for CPE for labs. Pt also advised to contact Dr. Marius Ditch with Gi due to report of loose bowels. Pt is not taking midday or evening dicyclomine or metoclopramide.

## 2020-05-25 NOTE — Patient Instructions (Signed)
Taylor Johnson , Thank you for taking time to come for your Medicare Wellness Visit. I appreciate your ongoing commitment to your health goals. Please review the following plan we discussed and let me know if I can assist you in the future.   Screening recommendations/referrals: Colonoscopy: done 12/13/19. Repeat in 2024 Mammogram: done 06/03/19. Please call [phone] to schedule your mammogram and bone density screening.  Bone Density: done 05/07/18 Recommended yearly ophthalmology/optometry visit for glaucoma screening and checkup Recommended yearly dental visit for hygiene and checkup  Vaccinations: Influenza vaccine: done Pneumococcal vaccine: done 04/05/16 Tdap vaccine: due Shingles vaccine: done 05/08/19   Covid-19:done 08/22/19 & 09/19/19  Conditions/risks identified: Recommend drinking 6-8 glasses of water per day   Next appointment: Follow up in one year for your annual wellness visit    Preventive Care 35 Years and Older, Female Preventive care refers to lifestyle choices and visits with your health care provider that can promote health and wellness. What does preventive care include?  A yearly physical exam. This is also called an annual well check.  Dental exams once or twice a year.  Routine eye exams. Ask your health care provider how often you should have your eyes checked.  Personal lifestyle choices, including:  Daily care of your teeth and gums.  Regular physical activity.  Eating a healthy diet.  Avoiding tobacco and drug use.  Limiting alcohol use.  Practicing safe sex.  Taking low-dose aspirin every day.  Taking vitamin and mineral supplements as recommended by your health care provider. What happens during an annual well check? The services and screenings done by your health care provider during your annual well check will depend on your age, overall health, lifestyle risk factors, and family history of disease. Counseling  Your health care provider  may ask you questions about your:  Alcohol use.  Tobacco use.  Drug use.  Emotional well-being.  Home and relationship well-being.  Sexual activity.  Eating habits.  History of falls.  Memory and ability to understand (cognition).  Work and work Statistician.  Reproductive health. Screening  You may have the following tests or measurements:  Height, weight, and BMI.  Blood pressure.  Lipid and cholesterol levels. These may be checked every 5 years, or more frequently if you are over 58 years old.  Skin check.  Lung cancer screening. You may have this screening every year starting at age 44 if you have a 30-pack-year history of smoking and currently smoke or have quit within the past 15 years.  Fecal occult blood test (FOBT) of the stool. You may have this test every year starting at age 89.  Flexible sigmoidoscopy or colonoscopy. You may have a sigmoidoscopy every 5 years or a colonoscopy every 10 years starting at age 66.  Hepatitis C blood test.  Hepatitis B blood test.  Sexually transmitted disease (STD) testing.  Diabetes screening. This is done by checking your blood sugar (glucose) after you have not eaten for a while (fasting). You may have this done every 1-3 years.  Bone density scan. This is done to screen for osteoporosis. You may have this done starting at age 3.  Mammogram. This may be done every 1-2 years. Talk to your health care provider about how often you should have regular mammograms. Talk with your health care provider about your test results, treatment options, and if necessary, the need for more tests. Vaccines  Your health care provider may recommend certain vaccines, such as:  Influenza vaccine. This is  recommended every year.  Tetanus, diphtheria, and acellular pertussis (Tdap, Td) vaccine. You may need a Td booster every 10 years.  Zoster vaccine. You may need this after age 74.  Pneumococcal 13-valent conjugate (PCV13) vaccine.  One dose is recommended after age 75.  Pneumococcal polysaccharide (PPSV23) vaccine. One dose is recommended after age 29. Talk to your health care provider about which screenings and vaccines you need and how often you need them. This information is not intended to replace advice given to you by your health care provider. Make sure you discuss any questions you have with your health care provider. Document Released: 07/31/2015 Document Revised: 03/23/2016 Document Reviewed: 05/05/2015 Elsevier Interactive Patient Education  2017 [place] Prevention in the Home Falls can cause injuries. They can happen to people of all ages. There are many things you can do to make your home safe and to help prevent falls. What can I do on the outside of my home?  Regularly fix the edges of walkways and driveways and fix any cracks.  Remove anything that might make you trip as you walk through a door, such as a raised step or threshold.  Trim any bushes or trees on the path to your home.  Use bright outdoor lighting.  Clear any walking paths of anything that might make someone trip, such as rocks or tools.  Regularly check to see if handrails are loose or broken. Make sure that both sides of any steps have handrails.  Any raised decks and porches should have guardrails on the edges.  Have any leaves, snow, or ice cleared regularly.  Use sand or salt on walking paths during winter.  Clean up any spills in your garage right away. This includes oil or grease spills. What can I do in the bathroom?  Use night lights.  Install grab bars by the toilet and in the tub and shower. Do not use towel bars as grab bars.  Use non-skid mats or decals in the tub or shower.  If you need to sit down in the shower, use a plastic, non-slip stool.  Keep the floor dry. Clean up any water that spills on the floor as soon as it happens.  Remove soap buildup in the tub or shower regularly.  Attach bath  mats securely with double-sided non-slip rug tape.  Do not have throw rugs and other things on the floor that can make you trip. What can I do in the bedroom?  Use night lights.  Make sure that you have a light by your bed that is easy to reach.  Do not use any sheets or blankets that are too big for your bed. They should not hang down onto the floor.  Have a firm chair that has side arms. You can use this for support while you get dressed.  Do not have throw rugs and other things on the floor that can make you trip. What can I do in the kitchen?  Clean up any spills right away.  Avoid walking on wet floors.  Keep items that you use a lot in easy-to-reach places.  If you need to reach something above you, use a strong step stool that has a grab bar.  Keep electrical cords out of the way.  Do not use floor polish or wax that makes floors slippery. If you must use wax, use non-skid floor wax.  Do not have throw rugs and other things on the floor that can make you trip.  What can I do with my stairs?  Do not leave any items on the stairs.  Make sure that there are handrails on both sides of the stairs and use them. Fix handrails that are broken or loose. Make sure that handrails are as long as the stairways.  Check any carpeting to make sure that it is firmly attached to the stairs. Fix any carpet that is loose or worn.  Avoid having throw rugs at the top or bottom of the stairs. If you do have throw rugs, attach them to the floor with carpet tape.  Make sure that you have a light switch at the top of the stairs and the bottom of the stairs. If you do not have them, ask someone to add them for you. What else can I do to help prevent falls?  Wear shoes that:  Do not have high heels.  Have rubber bottoms.  Are comfortable and fit you well.  Are closed at the toe. Do not wear sandals.  If you use a stepladder:  Make sure that it is fully opened. Do not climb a closed  stepladder.  Make sure that both sides of the stepladder are locked into place.  Ask someone to hold it for you, if possible.  Clearly mark and make sure that you can see:  Any grab bars or handrails.  First and last steps.  Where the edge of each step is.  Use tools that help you move around (mobility aids) if they are needed. These include:  Canes.  Walkers.  Scooters.  Crutches.  Turn on the lights when you go into a dark area. Replace any light bulbs as soon as they burn out.  Set up your furniture so you have a clear path. Avoid moving your furniture around.  If any of your floors are uneven, fix them.  If there are any pets around you, be aware of where they are.  Review your medicines with your doctor. Some medicines can make you feel dizzy. This can increase your chance of falling. Ask your doctor what other things that you can do to help prevent falls. This information is not intended to replace advice given to you by your health care provider. Make sure you discuss any questions you have with your health care provider. Document Released: 04/30/2009 Document Revised: 12/10/2015 Document Reviewed: 08/08/2014 Elsevier Interactive Patient Education  2017 [employer].

## 2020-06-04 ENCOUNTER — Ambulatory Visit (INDEPENDENT_AMBULATORY_CARE_PROVIDER_SITE_OTHER): Payer: Medicare Other | Admitting: Internal Medicine

## 2020-06-04 ENCOUNTER — Encounter: Payer: Self-pay | Admitting: Internal Medicine

## 2020-06-04 ENCOUNTER — Other Ambulatory Visit: Payer: Self-pay

## 2020-06-04 VITALS — BP 126/74 | HR 95 | Temp 97.3°F | Ht 64.0 in | Wt 253.0 lb

## 2020-06-04 DIAGNOSIS — Z9989 Dependence on other enabling machines and devices: Secondary | ICD-10-CM

## 2020-06-04 DIAGNOSIS — G4733 Obstructive sleep apnea (adult) (pediatric): Secondary | ICD-10-CM

## 2020-06-04 DIAGNOSIS — I10 Essential (primary) hypertension: Secondary | ICD-10-CM | POA: Diagnosis not present

## 2020-06-04 DIAGNOSIS — K219 Gastro-esophageal reflux disease without esophagitis: Secondary | ICD-10-CM | POA: Diagnosis not present

## 2020-06-04 NOTE — Progress Notes (Signed)
Date:  06/04/2020   Name:  Taylor Johnson   DOB:  1945-11-08   MRN:  [id number]   Chief Complaint: Hypertension (f/u)  Hypertension This is a chronic problem. The problem is controlled. Pertinent negatives include no chest pain, headaches, palpitations or shortness of breath. Past treatments include direct vasodilators. The current treatment provides significant improvement.    Lab Results  Component Value Date   CREATININE 0.94 11/08/2019   BUN 20 11/08/2019   NA 140 11/08/2019   K 3.9 11/08/2019   CL 103 11/08/2019   CO2 29 11/08/2019   Lab Results  Component Value Date   CHOL 163 02/05/2019   HDL 63 02/05/2019   LDLCALC 86 02/05/2019   TRIG 72 02/05/2019   CHOLHDL 2.6 02/05/2019   Lab Results  Component Value Date   TSH 1.960 01/13/2019   Lab Results  Component Value Date   HGBA1C 6.1 (H) 01/31/2017   Lab Results  Component Value Date   WBC 4.4 11/08/2019   HGB 12.1 11/08/2019   HCT 39.4 11/08/2019   MCV 91.2 11/08/2019   PLT 178 11/08/2019   Lab Results  Component Value Date   ALT 11 11/07/2019   AST 14 (L) 11/07/2019   ALKPHOS 92 11/07/2019   BILITOT 0.4 11/07/2019     Review of Systems  Constitutional: Negative for chills, fatigue and fever.  HENT: Negative for trouble swallowing.   Respiratory: Negative for cough, chest tightness, shortness of breath and wheezing.   Cardiovascular: Negative for chest pain, palpitations and leg swelling.  Gastrointestinal: Negative for abdominal distention, abdominal pain, constipation and diarrhea (has accidents with stools).  Musculoskeletal: Negative for arthralgias and gait problem.  Neurological: Negative for headaches.  Psychiatric/Behavioral: Negative for sleep disturbance. The patient is not nervous/anxious.     Patient Active Problem List   Diagnosis Date Noted  . Abdominal pain 11/07/2019  . Hematochezia 11/07/2019  . GERD (gastroesophageal reflux disease) 11/07/2019  . Dizziness 02/04/2019  .  Benign essential HTN 10/23/2018  . Rectal bleeding 10/15/2018  . Functional systolic murmur 43/32/9518  . Developmental delay disorder 02/06/2017  . Calculus of gallbladder without cholecystitis without obstruction 01/20/2017  . Cough 06/02/2016  . Localized edema 04/15/2016  . Microscopic hematuria 04/15/2016  . OSA on CPAP 11/18/2015  . Dysphagia 10/15/2015  . Urge incontinence of urine 05/12/2015  . Diverticulosis of colon 05/11/2015  . Anxiety, generalized 05/11/2015    No Known Allergies  Past Surgical History:  Procedure Laterality Date  . ABDOMINAL HYSTERECTOMY    . BREAST BIOPSY Left    neg  . COLON SURGERY    . COLONOSCOPY WITH PROPOFOL N/A 12/13/2019   Procedure: COLONOSCOPY WITH PROPOFOL;  Surgeon: Lin Landsman, MD;  Location[facility] END[facility]COPY;  Service: Endoscopy;  Laterality: N/A;  . ESOPHAGOGASTRODUODENOSCOPY (EGD) WITH PROPOFOL N/A 01/29/2017   Procedure: ESOPHAGOGASTRODUODENOSCOPY (EGD) WITH PROPOFOL;  Surgeon: Wilford Corner, MD;  Location: [facility] ENDOSCOPY;  Service: Endoscopy;  Laterality: N/A;    Social History   Tobacco Use  . Smoking status: Never Smoker  . Smokeless tobacco: Never Used  . Tobacco comment: smoking cessation materials not required  Vaping Use  . Vaping Use: Never used  Substance Use Topics  . Alcohol use: No    Alcohol/week: 0.0 standard drinks  . Drug use: No     Medication list has been reviewed and updated.  Current Meds  Medication Sig  . dicyclomine (BENTYL) 10 MG capsule TAKE TWO CAPSULES BY MOUTH FOUR  TIMES DAILY  . hydrALAZINE (APRESOLINE) 25 MG tablet TAKE ONE TABLET BY MOUTH THREE TIMES DAILY  . meclizine (ANTIVERT) 12.5 MG tablet TAKE ONE TABLET BY MOUTH THREE TIMES DAILY AS NEEDED FOR DIZZINESS  . metoCLOPramide (REGLAN) 5 MG tablet TAKE ONE TABLET BY MOUTH THREE TIMES DAILY BEFORE MEALS    PHQ 2/9 Scores 06/04/2020 05/25/2020 09/18/2019 05/20/2019  PHQ - 2 Score 2 3 2  0  PHQ- 9 Score 2 3 6  -    GAD 7 :  Generalized Anxiety Score 06/04/2020  Nervous, Anxious, on Edge 1  Control/stop worrying 1  Worry too much - different things 1  Trouble relaxing 0  Restless 0  Easily annoyed or irritable 0  Afraid - awful might happen 0  Total GAD 7 Score 3    BP Readings from Last 3 Encounters:  06/04/20 126/74  05/25/20 140/78  01/16/20 (!) 150/80    Physical Exam Vitals and nursing note reviewed.  Constitutional:      General: She is not in acute distress.    Appearance: She is well-developed. She is obese.  HENT:     Head: Normocephalic and atraumatic.  Neck:     Vascular: No carotid bruit.  Cardiovascular:     Rate and Rhythm: Normal rate and regular rhythm.     Pulses: Normal pulses.  Pulmonary:     Effort: Pulmonary effort is normal. No respiratory distress.     Breath sounds: No wheezing or rhonchi.  Musculoskeletal:     Cervical back: Normal range of motion.     Right lower leg: No edema.     Left lower leg: No edema.  Lymphadenopathy:     Cervical: No cervical adenopathy.  Skin:    General: Skin is warm and dry.     Capillary Refill: Capillary refill takes less than 2 seconds.     Findings: No rash.  Neurological:     General: No focal deficit present.     Mental Status: She is alert.  Psychiatric:        Mood and Affect: Mood normal.     Wt Readings from Last 3 Encounters:  06/04/20 253 lb (114.8 kg)  05/25/20 251 lb (113.9 kg)  01/16/20 237 lb 3.2 oz (107.6 kg)    BP 126/74   Pulse 95   Temp (!) 97.3 F (36.3 C) (Oral)   Ht 5\' 4"  (1.626 m)   Wt 253 lb (114.8 kg)   SpO2 99%   BMI 43.43 kg/m   Assessment and Plan: 1. Benign essential HTN Supposed to be on hydralazine tid - unclear if she is taking it - her sister will verify - CBC with Differential/Platelet - Comprehensive metabolic panel - TSH  2. OSA on CPAP Pt is compliant with CPap nightly; She sleeps well and has no daytime somnolence  3. Gastroesophageal reflux disease without  esophagitis Followed by GI On Bentyl and Reglan Recommend scheduled toileting to avoid accidents. - CBC with Differential/Platelet   Partially dictated using Editor, commissioning. Any errors are unintentional.  Halina Maidens, MD [facility]  06/04/2020

## 2020-06-05 LAB — CBC WITH DIFFERENTIAL/PLATELET
Basophils Absolute: 0 10*3/uL (ref 0.0–0.2)
Basos: 1 %
EOS (ABSOLUTE): 0 10*3/uL (ref 0.0–0.4)
Eos: 1 %
Hematocrit: 40.3 % (ref 34.0–46.6)
Hemoglobin: 12.8 g/dL (ref 11.1–15.9)
Immature Grans (Abs): 0 10*3/uL (ref 0.0–0.1)
Immature Granulocytes: 0 %
Lymphocytes Absolute: 1.7 10*3/uL (ref 0.7–3.1)
Lymphs: 38 %
MCH: 28.6 pg (ref 26.6–33.0)
MCHC: 31.8 g/dL (ref 31.5–35.7)
MCV: 90 fL (ref 79–97)
Monocytes Absolute: 0.7 10*3/uL (ref 0.1–0.9)
Monocytes: 15 %
Neutrophils Absolute: 2 10*3/uL (ref 1.4–7.0)
Neutrophils: 45 %
Platelets: 216 10*3/uL (ref 150–450)
RBC: 4.48 x10E6/uL (ref 3.77–5.28)
RDW: 13.5 % (ref 11.7–15.4)
WBC: 4.4 10*3/uL (ref 3.4–10.8)

## 2020-06-05 LAB — TSH: TSH: 1.15 u[IU]/mL (ref 0.450–4.500)

## 2020-06-05 LAB — COMPREHENSIVE METABOLIC PANEL
ALT: 9 IU/L (ref 0–32)
AST: 13 IU/L (ref 0–40)
Albumin/Globulin Ratio: 1.4 (ref 1.2–2.2)
Albumin: 4.4 g/dL (ref 3.7–4.7)
Alkaline Phosphatase: 107 IU/L (ref 44–121)
BUN/Creatinine Ratio: 27 (ref 12–28)
BUN: 25 mg/dL (ref 8–27)
Bilirubin Total: 0.2 mg/dL (ref 0.0–1.2)
CO2: 25 mmol/L (ref 20–29)
Calcium: 9.7 mg/dL (ref 8.7–10.3)
Chloride: 102 mmol/L (ref 96–106)
Creatinine, Ser: 0.93 mg/dL (ref 0.57–1.00)
GFR calc Af Amer: 71 mL/min/{1.73_m2} (ref >59)
GFR calc non Af Amer: 61 mL/min/{1.73_m2} (ref >59)
Globulin, Total: 3.1 g/dL (ref 1.5–4.5)
Glucose: 101 mg/dL — ABNORMAL HIGH (ref 65–99)
Potassium: 4.4 mmol/L (ref 3.5–5.2)
Sodium: 141 mmol/L (ref 134–144)
Total Protein: 7.5 g/dL (ref 6.0–8.5)

## 2020-06-08 ENCOUNTER — Other Ambulatory Visit: Payer: Self-pay | Admitting: Internal Medicine

## 2020-06-08 ENCOUNTER — Other Ambulatory Visit: Payer: Self-pay | Admitting: Gastroenterology

## 2020-06-08 DIAGNOSIS — R1084 Generalized abdominal pain: Secondary | ICD-10-CM

## 2020-06-08 NOTE — Telephone Encounter (Signed)
Requested medication (s) are due for refill today - yes  Requested medication (s) are on the active medication list -yes  Future visit scheduled -no  Last refill: 01/09/20  Notes to clinic: Request non delegated Rx  Requested Prescriptions  Pending Prescriptions Disp Refills   meclizine (ANTIVERT) 12.5 MG tablet [Pharmacy Med Name: meclizine 12.5 mg tablet] 30 tablet 5    Sig: TAKE ONE TABLET BY MOUTH THREE TIMES DAILY AS NEEDED FOR DIZZINESS      Not Delegated - Gastroenterology: Antiemetics Failed - 06/08/2020  2:07 PM      Failed - This refill cannot be delegated      Passed - Valid encounter within last 6 months    Recent Outpatient Visits           4 days ago Benign essential HTN   [facility] [name], MD   8 months ago Benign essential HTN   [facility] [name], MD   1 year ago Benign essential HTN   [facility] [name], MD   1 year ago Gastritis without bleeding, unspecified chronicity, unspecified gastritis type   [facility] [name], MD   1 year ago Rectal bleeding   [facility] [name], MD                  Requested Prescriptions  Pending Prescriptions Disp Refills   meclizine (ANTIVERT) 12.5 MG tablet [Pharmacy Med Name: meclizine 12.5 mg tablet] 30 tablet 5    Sig: TAKE ONE TABLET BY MOUTH THREE TIMES DAILY AS NEEDED FOR DIZZINESS      Not Delegated - Gastroenterology: Antiemetics Failed - 06/08/2020  2:07 PM      Failed - This refill cannot be delegated      Passed - Valid encounter within last 6 months    Recent Outpatient Visits           4 days ago Benign essential HTN   [facility] [name], MD   8 months ago Benign essential HTN   [facility] [name], MD   1 year ago Benign essential HTN   [facility] [name], MD   1 year ago Gastritis without bleeding, unspecified chronicity,  unspecified gastritis type   [facility] [name], MD   1 year ago Rectal bleeding   [facility] [name], MD

## 2020-07-03 ENCOUNTER — Other Ambulatory Visit: Payer: Self-pay | Admitting: Internal Medicine

## 2020-07-03 ENCOUNTER — Other Ambulatory Visit: Payer: Self-pay | Admitting: Gastroenterology

## 2020-07-03 DIAGNOSIS — R1084 Generalized abdominal pain: Secondary | ICD-10-CM

## 2020-07-03 NOTE — Telephone Encounter (Signed)
Requested medication (s) are due for refill today - yes  Requested medication (s) are on the active medication list -yes  Future visit scheduled -no  Last refill: 06/12/20  Notes to clinic: Request non delegated rx  Requested Prescriptions  Pending Prescriptions Disp Refills   meclizine (ANTIVERT) 12.5 MG tablet [Pharmacy Med Name: meclizine 12.5 mg tablet] 30 tablet 5    Sig: TAKE ONE TABLET BY MOUTH THREE TIMES DAILY AS NEEDED FOR DIZZINESS      Not Delegated - Gastroenterology: Antiemetics Failed - 07/03/2020  9:40 AM      Failed - This refill cannot be delegated      Passed - Valid encounter within last 6 months    Recent Outpatient Visits           4 weeks ago Benign essential HTN   [facility] [name], MD   9 months ago Benign essential HTN   [facility] [name], MD   1 year ago Benign essential HTN   [facility] [name], MD   1 year ago Gastritis without bleeding, unspecified chronicity, unspecified gastritis type   [facility] [name], MD   1 year ago Rectal bleeding   [facility] [name], MD                    Requested Prescriptions  Pending Prescriptions Disp Refills   meclizine (ANTIVERT) 12.5 MG tablet [Pharmacy Med Name: meclizine 12.5 mg tablet] 30 tablet 5    Sig: TAKE ONE TABLET BY MOUTH THREE TIMES DAILY AS NEEDED FOR DIZZINESS      Not Delegated - Gastroenterology: Antiemetics Failed - 07/03/2020  9:40 AM      Failed - This refill cannot be delegated      Passed - Valid encounter within last 6 months    Recent Outpatient Visits           4 weeks ago Benign essential HTN   [facility] [name], MD   9 months ago Benign essential HTN   [facility] [name], MD   1 year ago Benign essential HTN   [facility] [name], MD   1 year ago Gastritis without bleeding, unspecified  chronicity, unspecified gastritis type   [facility] [name], MD   1 year ago Rectal bleeding   [facility] [name], MD

## 2020-07-06 ENCOUNTER — Other Ambulatory Visit: Payer: Self-pay | Admitting: Internal Medicine

## 2020-07-06 DIAGNOSIS — K2970 Gastritis, unspecified, without bleeding: Secondary | ICD-10-CM

## 2020-07-06 DIAGNOSIS — R030 Elevated blood-pressure reading, without diagnosis of hypertension: Secondary | ICD-10-CM

## 2020-07-13 ENCOUNTER — Other Ambulatory Visit: Payer: Self-pay

## 2020-07-13 ENCOUNTER — Ambulatory Visit
Admission: RE | Admit: 2020-07-13 | Discharge: 2020-07-13 | Disposition: A | Discharge status: Home / Self Care | Payer: Medicare Other | Source: Ambulatory Visit | Attending: Internal Medicine | Admitting: Internal Medicine

## 2020-07-13 DIAGNOSIS — Z1231 Encounter for screening mammogram for malignant neoplasm of breast: Secondary | ICD-10-CM | POA: Diagnosis present

## 2020-07-13 DIAGNOSIS — Z780 Asymptomatic menopausal state: Secondary | ICD-10-CM | POA: Diagnosis present

## 2020-07-16 ENCOUNTER — Ambulatory Visit: Payer: Self-pay

## 2020-07-16 NOTE — Telephone Encounter (Signed)
Pt.'s niece, Taylor Johnson, reports pt. Started having lower abdominal pain last night. Comes and goes. Pain is moderate. Tylenol "helped a little." No fever, no constipation or diarrhea. No availability in the practice. Asking to be worked in today. Please advise.  Reason for Disposition . [1] MILD-MODERATE pain AND [2] constant AND [3] present > 2 hours  Answer Assessment - Initial Assessment Questions 1. LOCATION: "Where does it hurt?"      Lower 2. RADIATION: "Does the pain shoot anywhere else?" (e.g., chest, back)     No 3. ONSET: "When did the pain begin?" (e.g., minutes, hours or days ago)      Last night 4. SUDDEN: "Gradual or sudden onset?"     Sudden 5. PATTERN "Does the pain come and go, or is it constant?"    - If constant: "Is it getting better, staying the same, or worsening?"      (Note: Constant means the pain never goes away completely; most serious pain is constant and it progresses)     - If intermittent: "How long does it last?" "Do you have pain now?"     (Note: Intermittent means the pain goes away completely between bouts)     Comes and goes 6. SEVERITY: "How bad is the pain?"  (e.g., Scale 1-10; mild, moderate, or severe)   - MILD (1-3): doesn't interfere with normal activities, abdomen soft and not tender to touch    - MODERATE (4-7): interferes with normal activities or awakens from sleep, tender to touch    - SEVERE (8-10): excruciating pain, doubled over, unable to do any normal activities      Moderate 7. RECURRENT SYMPTOM: "Have you ever had this type of stomach pain before?" If Yes, ask: "When was the last time?" and "What happened that time?"      Yes 8. CAUSE: "What do you think is causing the stomach pain?"     Unsure 9. RELIEVING/AGGRAVATING FACTORS: "What makes it better or worse?" (e.g., movement, antacids, bowel movement)     Tylenol 10. OTHER SYMPTOMS: "Has there been any vomiting, diarrhea, constipation, or urine problems?"       No 11. PREGNANCY:  "Is there any chance you are pregnant?" "When was your last menstrual period?"       No  Protocols used: ABDOMINAL PAIN - [facility]

## 2020-07-16 NOTE — Telephone Encounter (Signed)
Spoke to sister and gave info

## 2020-07-20 ENCOUNTER — Telehealth (INDEPENDENT_AMBULATORY_CARE_PROVIDER_SITE_OTHER): Payer: Medicare Other | Admitting: Gastroenterology

## 2020-07-20 ENCOUNTER — Encounter: Payer: Self-pay | Admitting: Gastroenterology

## 2020-07-20 DIAGNOSIS — R1030 Lower abdominal pain, unspecified: Secondary | ICD-10-CM | POA: Diagnosis not present

## 2020-07-20 DIAGNOSIS — R197 Diarrhea, unspecified: Secondary | ICD-10-CM | POA: Diagnosis not present

## 2020-07-20 NOTE — Progress Notes (Signed)
Taylor Donath, MD [address]  [address]  [place], [place] [address]  Main: [phone]  Fax: [phone]    Gastroenterology Consultation Tele Visit  Referring Provider:     Reubin Milan, MD Primary Care Physician:  Reubin Milan, MD Primary Gastroenterologist:  Dr. Arlyss Repress Reason for Consultation:     Lower abdominal pain and fecal incontinence        HPI:   Taylor Johnson is a 75 y.o. female referred by Dr. Judithann Graves, Nyoka Cowden, MD  for consultation & management of lower abdominal pain and fecal incontinence  Virtual Visit via Telephone Note  I connected with Taylor Johnson on 07/20/20 at  1:30 PM EST by telephone and verified that I am speaking with the correct person using two identifiers.   I discussed the limitations, risks, security and privacy concerns of performing an evaluation and management service by telephone and the availability of in person appointments. I also discussed with the patient that there may be a patient responsible charge related to this service. The patient expressed understanding and agreed to proceed.  Location of the Patient: Home  Location of the provider: Office  Persons participating in the visit: Patient, patient's sister and provider only  History of Present Illness:   Taylor Johnson is a 75 year old female with past medical history as detailed below.  Patient reports that she just started experiencing lower abdominal pain and has been soiling her depends.  She denies any rectal bleeding.  She states that every time she changes her depends, she is seeing brown stool.  Also reports nocturnal incontinence.  She is concerned that she has no control over her bowel movements.  She denies drinking sodas or sweet tea daily, she drinks milk once a day, she denies eating cheese.  She denies eating out.  She denies fever, chills, nausea or vomiting, abdominal bloating.  NSAIDs: None  Antiplts/Anticoagulants/Anti thrombotics:  None  GI Procedures:  EGD 01/2017 normal. Colonoscopy 04/2014 showed diverticulosis. Complete report not available and patient reports that it was done at [facility]  Colonoscopy 12/13/2019 - Hemorrhoids found on perianal exam. - One 10 mm polyp in the ascending colon, removed with a hot snare. Resected and retrieved. - Three 3 to 6 mm polyps in the descending colon and in the ascending colon, removed with a cold snare. Resected and retrieved. - Diverticulosis in the entire examined colon. - Non-bleeding internal hemorrhoids.  DIAGNOSIS:  A. COLON POLYP X3, ASCENDING; BIOPSY:  - 2 FRAGMENTS OF TUBULAR ADENOMA AND A FRAGMENT OF BENIGN COLONIC  MUCOSA.  - NEGATIVE FOR HIGH-GRADE DYSPLASIA AND MALIGNANCY.   B. COLON POLYP, DESCENDING; BIOPSY:  - FRAGMENTS OF TUBULAR ADENOMA.  - NEGATIVE FOR HIGH-GRADE DYSPLASIA AND MALIGNANCY.   Past Medical History:  Diagnosis Date  . Abdominal pain 01/27/2017  . Anxiety   . Anxiety, generalized 05/11/2015  . Cough 06/02/2016   Chronic - followed by Pulmonary  . Depression   . Developmental delay   . Diverticulitis   . Diverticulosis of colon 05/11/2015  . Dysphagia 10/15/2015   Routine Ba Swallow normal; rec modified barium swallow   . GERD (gastroesophageal reflux disease)   . Hypoxia 09/12/2015  . Leg swelling   . Localized edema 04/15/2016  . Microscopic hematuria 04/15/2016  . Nausea and vomiting 01/29/2017  . Orthostatic hypotension 03/16/2017  . OSA on CPAP 11/18/2015   CPAP @ 18 cm H2O started 02/2016  . Rectal bleeding 10/15/2018  .  Urge incontinence of urine 05/12/2015    Past Surgical History:  Procedure Laterality Date  . ABDOMINAL HYSTERECTOMY    . BREAST BIOPSY Left    neg  . COLON SURGERY    . COLONOSCOPY WITH PROPOFOL N/A 12/13/2019   Procedure: COLONOSCOPY WITH PROPOFOL;  Surgeon: Lin Landsman, MD;  Location: [facility] ENDOSCOPY;  Service: Endoscopy;  Laterality: N/A;  . ESOPHAGOGASTRODUODENOSCOPY (EGD) WITH  PROPOFOL N/A 01/29/2017   Procedure: ESOPHAGOGASTRODUODENOSCOPY (EGD) WITH PROPOFOL;  Surgeon: Wilford Corner, MD;  Location[facility] ENDOSCOPY;  Service: Endoscopy;  Laterality: N/A;    Current Outpatient Medications:  .  dicyclomine (BENTYL) 10 MG capsule, TAKE TWO CAPSULES BY MOUTH FOUR TIMES DAILY, Disp: 224 capsule, Rfl: 0 .  hydrALAZINE (APRESOLINE) 25 MG tablet, TAKE ONE TABLET BY MOUTH THREE TIMES DAILY, Disp: 84 tablet, Rfl: 2 .  meclizine (ANTIVERT) 12.5 MG tablet, TAKE ONE TABLET BY MOUTH THREE TIMES DAILY AS NEEDED FOR DIZZINESS, Disp: 30 tablet, Rfl: 5 .  metoCLOPramide (REGLAN) 5 MG tablet, TAKE ONE TABLET BY MOUTH EVERY MORNING, NOON, EVENING, Disp: 84 tablet, Rfl: 5   Family History  Problem Relation Age of Onset  . Hypertension Mother   . Kidney disease Mother   . Dementia Mother   . Cancer Father        lung  . Bladder Cancer Neg Hx   . Breast cancer Neg Hx      Social History   Tobacco Use  . Smoking status: Never Smoker  . Smokeless tobacco: Never Used  . Tobacco comment: smoking cessation materials not required  Vaping Use  . Vaping Use: Never used  Substance Use Topics  . Alcohol use: No    Alcohol/week: 0.0 standard drinks  . Drug use: No    Allergies as of 07/20/2020  . (No Known Allergies)     Imaging Studies: Reviewed  Assessment and Plan:   Taylor Johnson is a 75 y.o. female with history of pancolonic diverticulosis, morbid obesity with 1 day history of lower abdominal pain associated with fecal incontinence, nonbloody diarrhea  Recommend stool studies to rule out infection Caution patient and her sister to go to emergency room if she develops worsening of abdominal pain or fever or call us during office hours   Follow Up Instructions:   I discussed the assessment and treatment plan with the patient. The patient was provided an opportunity to ask questions and all were answered. The patient agreed with the plan and demonstrated an  understanding of the instructions.   The patient was advised to call back or seek an in-person evaluation if the symptoms worsen or if the condition fails to improve as anticipated.  I provided 15 minutes of non-face-to-face time during this encounter.   Follow up based on the above work-up   [name], MD

## 2020-07-23 ENCOUNTER — Telehealth: Payer: Self-pay

## 2020-07-23 NOTE — Telephone Encounter (Signed)
Agree with return to ER if pain is worse  RV

## 2020-07-23 NOTE — Telephone Encounter (Signed)
Patient niece came to pick up the stool kit that we have order. She states she will bring it back on Monday. She said patient is not eating anything because she is having bad abdominal pain. She states when she eats the pain is worse. She is still drink water. Informed niece I would let you know but if she began to not drink liquids or began to get really fatigue she needed to go to the ER to be seen.

## 2020-07-27 DIAGNOSIS — R1030 Lower abdominal pain, unspecified: Secondary | ICD-10-CM | POA: Diagnosis not present

## 2020-07-27 DIAGNOSIS — R197 Diarrhea, unspecified: Secondary | ICD-10-CM | POA: Diagnosis not present

## 2020-07-29 LAB — GI PROFILE, STOOL, PCR

## 2020-07-30 ENCOUNTER — Telehealth: Payer: Self-pay

## 2020-07-30 NOTE — Telephone Encounter (Signed)
Called and left a message for call back  

## 2020-07-30 NOTE — Telephone Encounter (Signed)
-----   Message from Lin Landsman, MD sent at 07/30/2020  1:34 PM EST ----- She had diarrhea secondary to norovirus which is a viral infection.  This is a self-limited infection and should resolve on its own

## 2020-07-30 NOTE — Telephone Encounter (Signed)
Patient caregiver verbalized understanding  

## 2020-08-04 ENCOUNTER — Telehealth: Payer: Self-pay | Admitting: Gastroenterology

## 2020-08-04 DIAGNOSIS — R1084 Generalized abdominal pain: Secondary | ICD-10-CM

## 2020-08-04 NOTE — Telephone Encounter (Signed)
I sent medication to the pharmacy.

## 2020-08-04 NOTE — Telephone Encounter (Signed)
Pharmacy calling to get refill of medication dicyclomine for patient.

## 2020-08-05 IMAGING — MR MRI HEAD WITHOUT CONTRAST
9 of 10 series · 43 of 48 positions shown · non-contrast
Comparison: CT a head 02/04/2019

CLINICAL DATA: Central vertigo persistent

EXAM:
MRI HEAD WITHOUT CONTRAST
TECHNIQUE: Multiplanar, multiecho pulse sequences of the brain and surrounding
structures were obtained without intravenous contrast.

[Series 2: ax dwi_tracew · axial · 3.0mm · 0.83mm/px · z∈[-128,+27]mm · 6 of 55 slices shown]
[im 1/55]
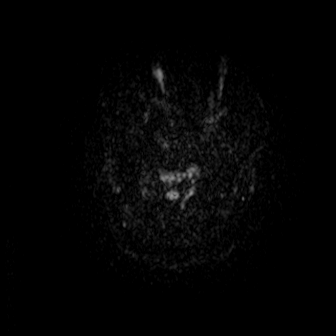
[im 11/55]
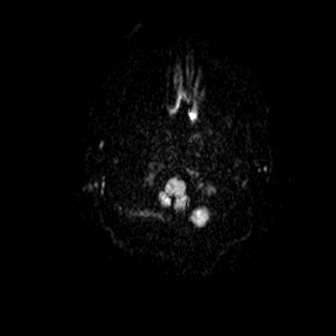
[im 22/55]
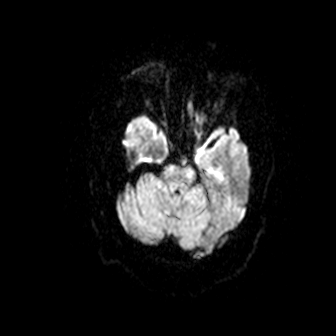
[im 33/55]
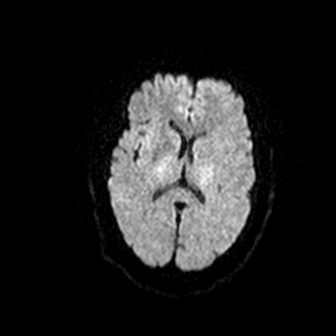
[im 44/55]
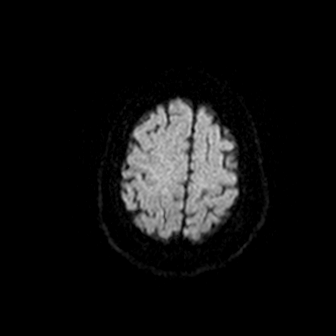
[im 55/55]
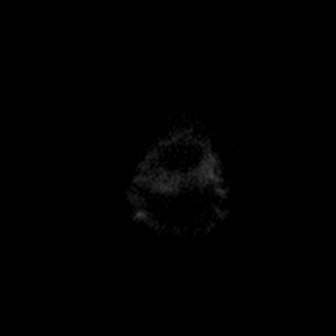

[Series 3: ax dwi_adc · axial · 3.0mm · 0.83mm/px · z∈[-128,+27]mm · 7 of 55 slices shown]
[im 1/55]
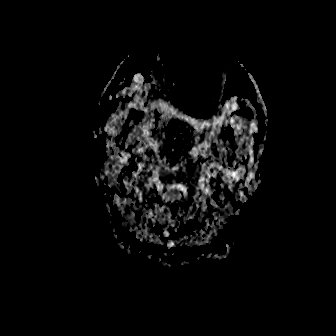
[im 10/55]
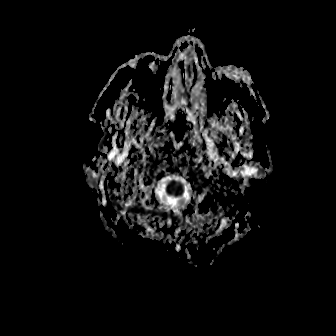
[im 19/55]
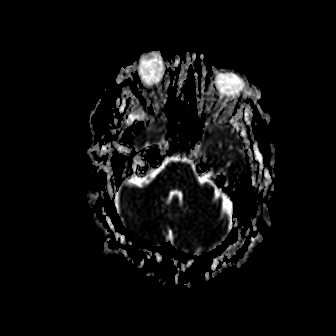
[im 28/55]
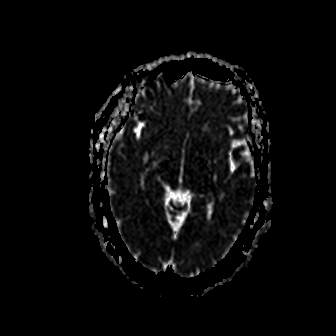
[im 37/55]
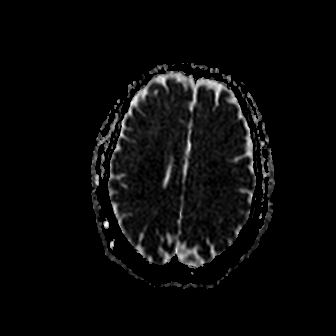
[im 46/55]
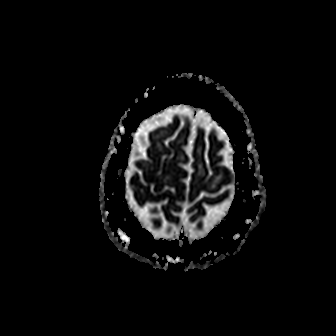
[im 55/55]
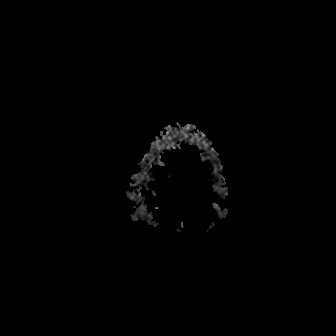

[Series 4: cor dwi_tracew · coronal · 5.0mm · 0.68mm/px · 6 of 45 slices shown]
[im 1/45]
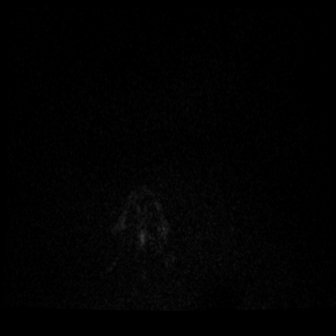
[im 9/45]
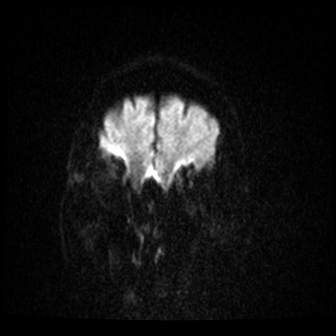
[im 18/45]
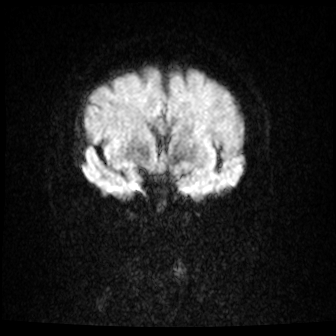
[im 27/45]
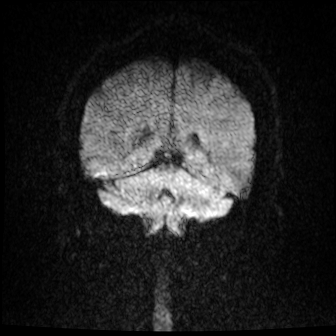
[im 36/45]
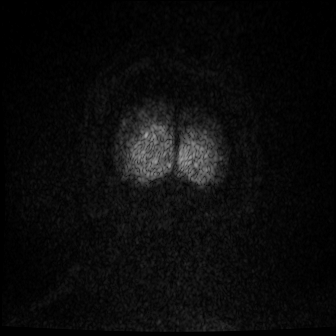
[im 45/45]
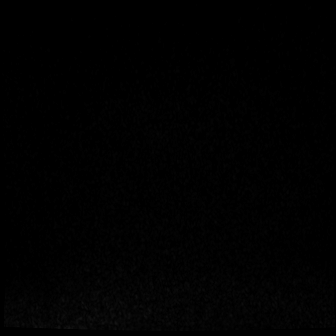

[Series 5: cor dwi_adc · coronal · 5.0mm · 0.68mm/px · 5 of 42 slices shown]
[im 1/42]
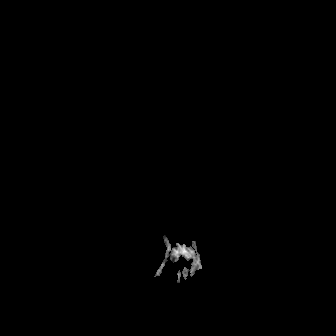
[im 9/42]
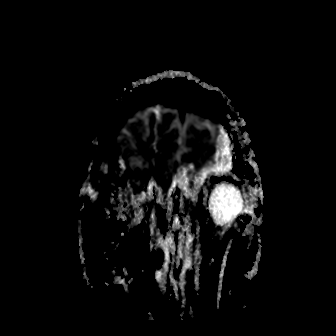
[im 17/42]
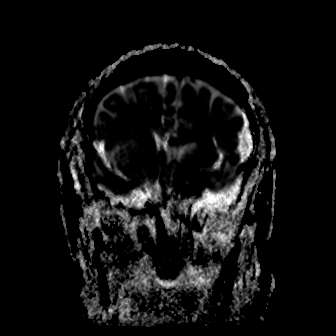
[im 25/42]
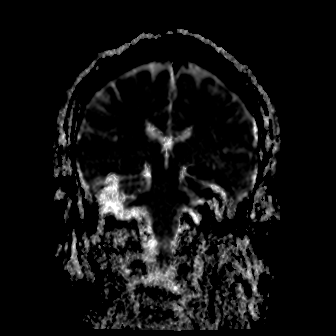
[im 33/42]
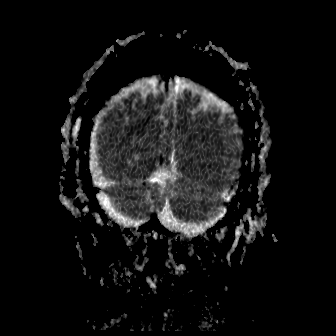

[Series 7: T2 · axial · 5.0mm · 0.45mm/px · z∈[-113,+37]mm · 4 of 27 slices shown (1 of 2)]
[im 1/27]
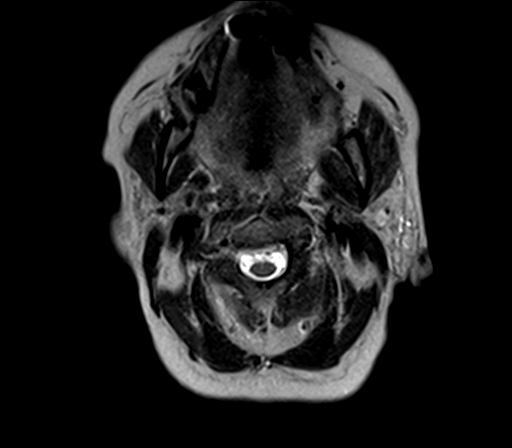
[im 9/27]
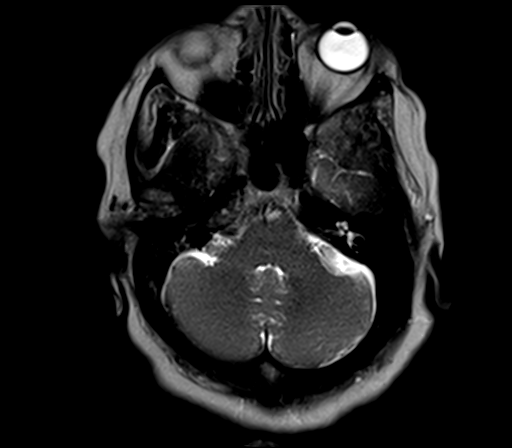
[im 18/27]
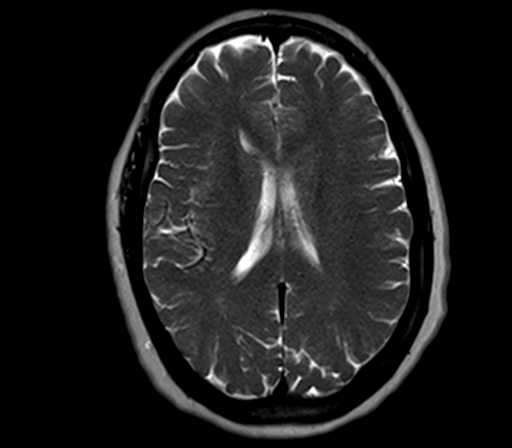
[im 27/27]
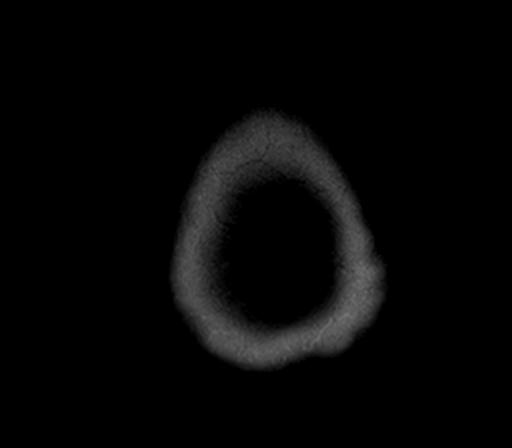

[Series 9: FLAIR · axial · 5.0mm · 1.20mm/px · z∈[-117,+33]mm · 4 of 27 slices shown]
[im 1/27]
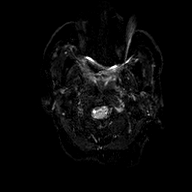
[im 9/27]
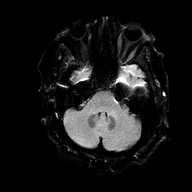
[im 18/27]
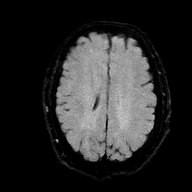
[im 27/27]
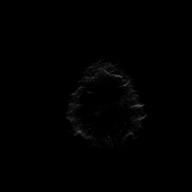

[Series 10: T1 · axial · 5.0mm · 0.90mm/px · z∈[-116,+33]mm · 4 of 27 slices shown (1 of 2)]
[im 1/27]
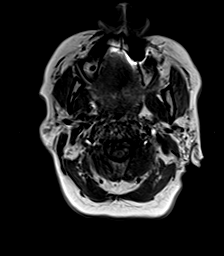
[im 9/27]
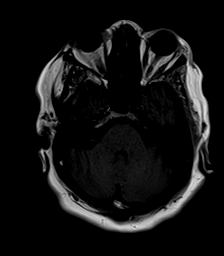
[im 18/27]
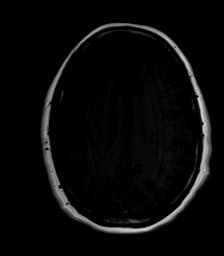
[im 27/27]
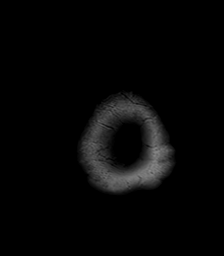

[Series 11: T2 · coronal · 5.0mm · 0.45mm/px · 4 of 31 slices shown (2 of 2)]
[im 1/31]
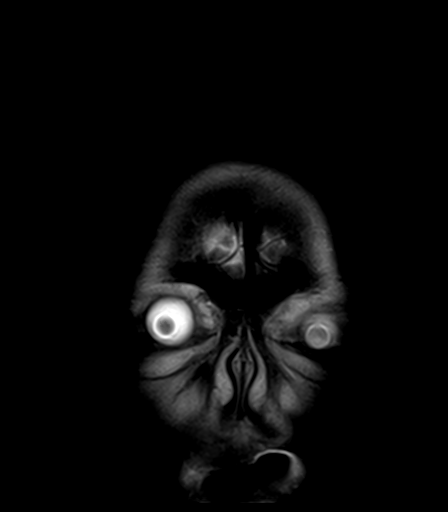
[im 11/31]
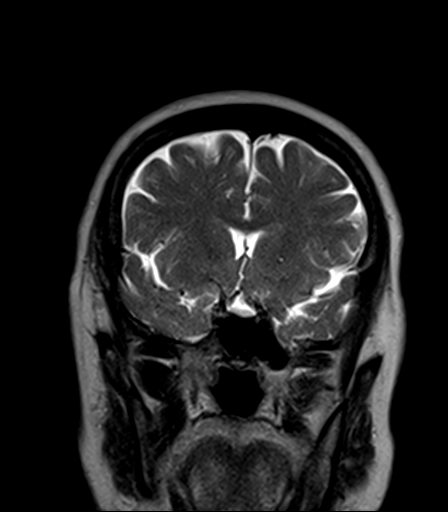
[im 21/31]
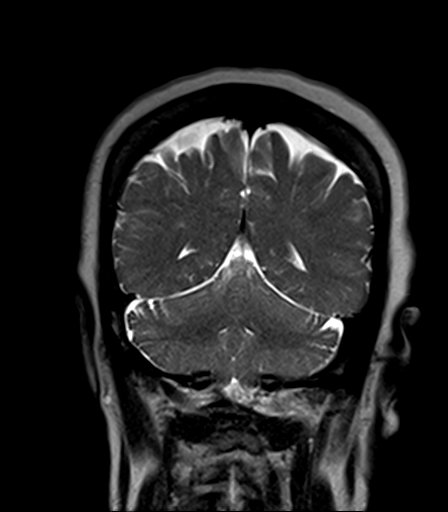
[im 31/31]
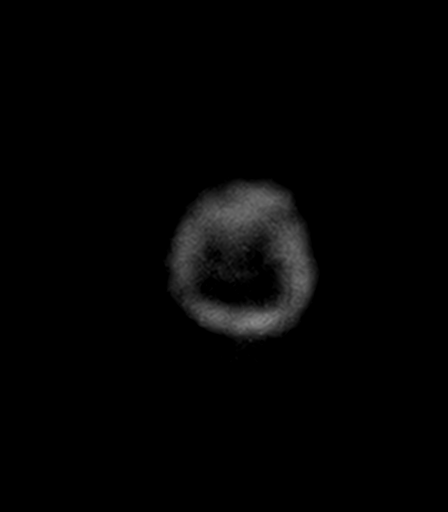

[Series 12: T1 · sagittal · 5.0mm · 0.94mm/px · 3 of 25 slices shown (2 of 2)]
[im 1/25]
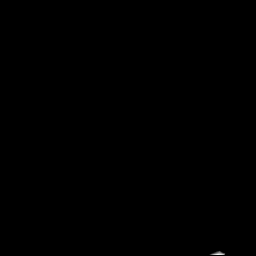
[im 13/25]
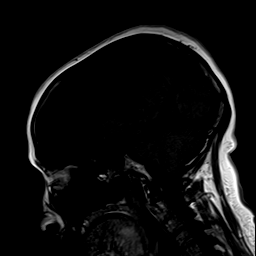
[im 25/25]
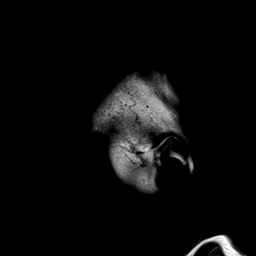

[43 of 48 positions shown; findings below may reference images not displayed]

FINDINGS: Brain: Ventricle size and cerebral volume normal. Negative for acute
infarct. Minimal chronic changes in the white matter. Negative for
hemorrhage mass or edema. No fluid collection or midline shift.

Vascular: Normal arterial flow voids

Skull and upper cervical spine: Negative

Sinuses/Orbits: Negative

Other: Image quality degraded by motion
IMPRESSION: Normal for age MRI brain.  No acute abnormality.

## 2020-08-10 ENCOUNTER — Ambulatory Visit (INDEPENDENT_AMBULATORY_CARE_PROVIDER_SITE_OTHER): Payer: Medicare Other | Admitting: Internal Medicine

## 2020-08-10 VITALS — BP 151/50 | HR 65 | Temp 98.6°F | Resp 18 | Ht 64.0 in | Wt 267.0 lb

## 2020-08-10 DIAGNOSIS — Z7189 Other specified counseling: Secondary | ICD-10-CM | POA: Diagnosis not present

## 2020-08-10 DIAGNOSIS — Z9989 Dependence on other enabling machines and devices: Secondary | ICD-10-CM

## 2020-08-10 DIAGNOSIS — G4733 Obstructive sleep apnea (adult) (pediatric): Secondary | ICD-10-CM | POA: Diagnosis not present

## 2020-08-10 NOTE — Progress Notes (Signed)
[facility] [place], [place] [address]  Pulmonary Sleep Medicine   Office Visit Note  Patient Name: Taylor Johnson DOB: 03-Oct-1945 MRN [id number]    Chief Complaint: Obstructive Sleep Apnea visit  Brief History:  Fatemah is seen today for follow up The patient has a 5+ year history of sleep apnea. Patient is using PAP nightly.  She has difficulty putting the mask on properly so the mask leak is markedly elevated. The patient feels more rested after sleeping with PAP.  The patient reports benefiting from PAP use. She is putting her mask on by herself and the mask leak is very elevated. She is also removing the mask to use the restroom at night.Reported sleepiness is  improved and the Epworth Sleepiness Score is 4 out of 24. The patient occasionally take naps. The patient complains of the following: no complains  The compliance download shows  Excellent compliance with an average use time of 6.2 hours. The AHI is 14.7  The patient does not complain of limb movements disrupting sleep.  ROS  General: (-) fever, (-) chills, (-) night sweat Nose and Sinuses: (-) nasal stuffiness or itchiness, (-) postnasal drip, (-) nosebleeds, (-) sinus trouble. Mouth and Throat: (-) sore throat, (-) hoarseness. Neck: (-) swollen glands, (-) enlarged thyroid, (-) neck pain. Respiratory: - cough, - shortness of breath, - wheezing. Neurologic: - numbness, - tingling. Psychiatric: - anxiety, - depression   Current Medication: Outpatient Encounter Medications as of 08/10/2020  Medication Sig  . dicyclomine (BENTYL) 10 MG capsule TAKE TWO CAPSULES BY MOUTH FOUR TIMES DAILY  . hydrALAZINE (APRESOLINE) 25 MG tablet TAKE ONE TABLET BY MOUTH THREE TIMES DAILY  . meclizine (ANTIVERT) 12.5 MG tablet TAKE ONE TABLET BY MOUTH THREE TIMES DAILY AS NEEDED FOR DIZZINESS  . metoCLOPramide (REGLAN) 5 MG tablet TAKE ONE TABLET BY MOUTH EVERY MORNING, NOON, EVENING   No facility-administered  encounter medications on file as of 08/10/2020.    Surgical History: Past Surgical History:  Procedure Laterality Date  . ABDOMINAL HYSTERECTOMY    . BREAST BIOPSY Left    neg  . COLON SURGERY    . COLONOSCOPY WITH PROPOFOL N/A 12/13/2019   Procedure: COLONOSCOPY WITH PROPOFOL;  Surgeon: Lin Landsman, MD;  Location[facility] END[facility]COPY;  Service: Endoscopy;  Laterality: N/A;  . ESOPHAGOGASTRODUODENOSCOPY (EGD) WITH PROPOFOL N/A 01/29/2017   Procedure: ESOPHAGOGASTRODUODENOSCOPY (EGD) WITH PROPOFOL;  Surgeon: Wilford Corner, MD;  Location: [facility] ENDOSCOPY;  Service: Endoscopy;  Laterality: N/A;    Medical History: Past Medical History:  Diagnosis Date  . Abdominal pain 01/27/2017  . Anxiety   . Anxiety, generalized 05/11/2015  . Cough 06/02/2016   Chronic - followed by Pulmonary  . Cough 06/02/2016   Chronic - followed by Pulmonary  . Depression   . Developmental delay   . Diverticulitis   . Diverticulosis of colon 05/11/2015  . Dysphagia 10/15/2015   Routine Ba Swallow normal; rec modified barium swallow   . GERD (gastroesophageal reflux disease)   . Hematochezia 11/07/2019  . Hypoxia 09/12/2015  . Leg swelling   . Localized edema 04/15/2016  . Microscopic hematuria 04/15/2016  . Nausea and vomiting 01/29/2017  . Orthostatic hypotension 03/16/2017  . OSA on CPAP 11/18/2015   CPAP @ 18 cm H2O started 02/2016  . Rectal bleeding 10/15/2018  . Urge incontinence of urine 05/12/2015    Family History: Non contributory to the present illness  Social History: Social History   Socioeconomic History  . Marital  status: Single    Spouse name: Not on file  . Number of children: 0  . Years of education: Not on file  . Highest education level: 5th grade  Occupational History    Employer: retired  Tobacco Use  . Smoking status: Never Smoker  . Smokeless tobacco: Never Used  . Tobacco comment: smoking cessation materials not required  Vaping Use  . Vaping Use: Never used   Substance and Sexual Activity  . Alcohol use: No    Alcohol/week: 0.0 standard drinks  . Drug use: No  . Sexual activity: Never    Birth control/protection: Post-menopausal  Other Topics Concern  . Not on file  Social History Narrative   Pt lives with her niece Dylin Ihnen   Social Determinants of Health   Financial Resource Strain: Low Risk   . Difficulty of Paying Living Expenses: Not very hard  Food Insecurity: No Food Insecurity  . Worried About Charity fundraiser in the Last Year: Never true  . Ran Out of Food in the Last Year: Never true  Transportation Needs: No Transportation Needs  . Lack of Transportation (Medical): No  . Lack of Transportation (Non-Medical): No  Physical Activity: Inactive  . Days of Exercise per Week: 0 days  . Minutes of Exercise per Session: 0 min  Stress: Stress Concern Present  . Feeling of Stress : Very much  Social Connections: Socially Isolated  . Frequency of Communication with Friends and Family: More than three times a week  . Frequency of Social Gatherings with Friends and Family: More than three times a week  . Attends Religious Services: Never  . Active Member of Clubs or Organizations: No  . Attends Archivist Meetings: Never  . Marital Status: Never married  Intimate Partner Violence: Not At Risk  . Fear of Current or Ex-Partner: No  . Emotionally Abused: No  . Physically Abused: No  . Sexually Abused: No    Vital Signs: Blood pressure (!) 151/50, pulse 65, temperature 98.6 F (37 C), temperature source Temporal, resp. rate 18, height 5' 4"  (1.626 m), weight 267 lb (121.1 kg), SpO2 94 %.  Examination: General Appearance: The patient is well-developed, well-nourished, and in no distress. Neck Circumference: 39 Skin: Gross inspection of skin unremarkable. Head: normocephalic, no gross deformities. Eyes: no gross deformities noted. ENT: ears appear grossly normal Neurologic: Alert and oriented. No  involuntary movements.    EPWORTH SLEEPINESS SCALE:  Scale:  (0)= no chance of dozing; (1)= slight chance of dozing; (2)= moderate chance of dozing; (3)= high chance of dozing  Chance  Situtation    Sitting and reading: 0    Watching TV: 0    Sitting Inactive in public: 0    As a passenger in car: 0      Lying down to rest: 3    Sitting and talking: 0    Sitting quielty after lunch: 1    In a car, stopped in traffic: 0   TOTAL SCORE:   4 out of 24    SLEEP STUDIES:  1. PSG 03/16/17 AHI 5 SpO42mn 71% (original not available)   CPAP COMPLIANCE DATA:  Date Range: 06/07/20-08/05/20  Average Daily Use: 6.2 hours  Median Use: 6.3  Compliance for > 4 Hours: 88%  AHI: 14.7 respiratory events per hour  Days Used: 60/60  Mask Leak: 120  95th Percentile Pressure: 8         LABS: Recent Results (from the past 2160 hour(s))  CBC with Differential/Platelet     Status: None   Collection Time: 06/04/20 11:40 AM  Result Value Ref Range   WBC 4.4 3.4 - 10.8 x10E3/uL   RBC 4.48 3.77 - 5.28 x10E6/uL   Hemoglobin 12.8 11.1 - 15.9 g/dL   Hematocrit 40.3 34.0 - 46.6 %   MCV 90 79 - 97 fL   MCH 28.6 26.6 - 33.0 pg   MCHC 31.8 31.5 - 35.7 g/dL   RDW 13.5 11.7 - 15.4 %   Platelets 216 150 - 450 x10E3/uL   Neutrophils 45 Not Estab. %   Lymphs 38 Not Estab. %   Monocytes 15 Not Estab. %   Eos 1 Not Estab. %   Basos 1 Not Estab. %   Neutrophils Absolute 2.0 1.4 - 7.0 x10E3/uL   Lymphocytes Absolute 1.7 0.7 - 3.1 x10E3/uL   Monocytes Absolute 0.7 0.1 - 0.9 x10E3/uL   EOS (ABSOLUTE) 0.0 0.0 - 0.4 x10E3/uL   Basophils Absolute 0.0 0.0 - 0.2 x10E3/uL   Immature Granulocytes 0 Not Estab. %   Immature Grans (Abs) 0.0 0.0 - 0.1 x10E3/uL  Comprehensive metabolic panel     Status: Abnormal   Collection Time: 06/04/20 11:40 AM  Result Value Ref Range   Glucose 101 (H) 65 - 99 mg/dL   BUN 25 8 - 27 mg/dL   Creatinine, Ser 0.93 0.57 - 1.00 mg/dL   GFR calc non Af  Amer 61 >59 mL/min/1.73   GFR calc Af Amer 71 >59 mL/min/1.73    Comment: **In accordance with recommendations from the NKF-ASN Task force,**   [facility] is in the process of updating its eGFR calculation to the   2021 CKD-EPI creatinine equation that estimates kidney function   without a race variable.    BUN/Creatinine Ratio 27 12 - 28   Sodium 141 134 - 144 mmol/L   Potassium 4.4 3.5 - 5.2 mmol/L   Chloride 102 96 - 106 mmol/L   CO2 25 20 - 29 mmol/L   Calcium 9.7 8.7 - 10.3 mg/dL   Total Protein 7.5 6.0 - 8.5 g/dL   Albumin 4.4 3.7 - 4.7 g/dL   Globulin, Total 3.1 1.5 - 4.5 g/dL   Albumin/Globulin Ratio 1.4 1.2 - 2.2   Bilirubin Total 0.2 0.0 - 1.2 mg/dL   Alkaline Phosphatase 107 44 - 121 IU/L    Comment:               **Please note reference interval change**   AST 13 0 - 40 IU/L   ALT 9 0 - 32 IU/L  TSH     Status: None   Collection Time: 06/04/20 11:40 AM  Result Value Ref Range   TSH 1.150 0.450 - 4.500 uIU/mL  GI Profile, Stool, PCR     Status: Abnormal   Collection Time: 07/27/20  1:47 PM  Result Value Ref Range   Campylobacter Not Detected Not Detected   C difficile toxin A/B Not Detected Not Detected   Plesiomonas shigelloides Not Detected Not Detected   Salmonella Not Detected Not Detected   Vibrio Not Detected Not Detected   Vibrio cholerae Not Detected Not Detected   Yersinia enterocolitica Not Detected Not Detected   Enteroaggregative E coli Not Detected Not Detected   Enteropathogenic E coli Not Detected Not Detected   Enterotoxigenic E coli Not Detected Not Detected   Shiga-toxin-producing E coli Not Detected Not Detected   E coli Y774 Not applicable Not Detected   Shigella/Enteroinvasive E coli Not  Detected Not Detected   Cryptosporidium Not Detected Not Detected   Cyclospora cayetanensis Not Detected Not Detected   Entamoeba histolytica Not Detected Not Detected   Giardia lamblia Not Detected Not Detected   Adenovirus F 40/41 Not Detected Not Detected    Astrovirus Not Detected Not Detected   Norovirus GI/GII Detected (A) Not Detected   Rotavirus A Not Detected Not Detected   Sapovirus Not Detected Not Detected    Radiology: DG Bone Density  Result Date: 07/13/2020 EXAM: DUAL X-RAY ABSORPTIOMETRY (DXA) FOR BONE MINERAL DENSITY IMPRESSION: Your patient Krishana Larkin completed a BMD test on 07/13/2020 using the [employer] (software version: 14.10) manufactured by [employer]. The following summarizes the results of our evaluation. Technologist: ecj PATIENT BIOGRAPHICAL: Name: Jamita, Mckelvin Patient ID: [id number] Birth Date: 1946/07/04 Height: 64.0 in. Gender: Female Exam Date: 07/13/2020 Weight: 253.0 lbs. Indications: Postmenopausal, Hysterectomy, Advanced Age Fractures: Treatments: DENSITOMETRY RESULTS: Site      Region     Measured Date Measured Age WHO Classification Young Adult T-score BMD         %Change vs. Previous Significant Change (*) AP Spine L3-L4 07/13/2020 73.6 Normal -0.4 1.164 g/cm2 1.5% - AP Spine L3-L4 05/07/2018 71.4 Normal -0.6 1.147 g/cm2 - - DualFemur Neck Left 07/13/2020 73.6 Normal -1.0 0.901 g/cm2 2.9% - DualFemur Neck Left 05/07/2018 71.4 Osteopenia -1.2 0.876 g/cm2 - - DualFemur Total Mean 07/13/2020 73.6 Normal 0.0 1.012 g/cm2 2.5% Yes DualFemur Total Mean 05/07/2018 71.4 Normal -0.2 0.987 g/cm2 - - ASSESSMENT: The BMD measured at Femur Neck Left is 0.901 g/cm2 with a T-score of -1.0. This patient is considered normal according to [facility]) criteria. The scan quality is good. L-1-2 was excluded due to degenerative changes. Compared with prior study, there has been no significant change in the spine. Compared with prior study, there has been no significant change in the total hip. World Pharmacologist [facility]) criteria for post-menopausal, Caucasian Women: Normal:                   T-score at or above -1 SD Osteopenia/low bone mass: T-score between -1 and -2.5 SD Osteoporosis:              T-score at or below -2.5 SD RECOMMENDATIONS: 1. All patients should optimize calcium and vitamin D intake. 2. Consider FDA-approved medical therapies in postmenopausal women and men aged 79 years and older, based on the following: a. A hip or vertebral(clinical or morphometric) fracture b. T-score < -2.5 at the femoral neck or spine after appropriate evaluation to exclude secondary causes c. Low bone mass (T-score between -1.0 and -2.5 at the femoral neck or spine) and a 10-year probability of a hip fracture > 3% or a 10-year probability of a major osteoporosis-related fracture > 20% based on the [place]-adapted WHO algorithm 3. Clinician judgment and/or patient preferences may indicate treatment for people with 10-year fracture probabilities above or below these levels FOLLOW-UP: People with diagnosed cases of osteoporosis or at high risk for fracture should have regular bone mineral density tests. For patients eligible for Medicare, routine testing is allowed once every 2 years. The testing frequency can be increased to one year for patients who have rapidly progressing disease, those who are receiving or discontinuing medical therapy to restore bone mass, or have additional risk factors. I have reviewed this report, and agree with the above findings. [facility] Electronically Signed   By: Rolm Baptise M.D.  On: 07/13/2020 11:51   MM 3D SCREEN BREAST BILATERAL  Result Date: 07/14/2020 CLINICAL DATA:  Screening. EXAM: DIGITAL SCREENING BILATERAL MAMMOGRAM WITH TOMO AND CAD COMPARISON:  Previous exam(s). ACR Breast Density Category b: There are scattered areas of fibroglandular density. FINDINGS: There are no findings suspicious for malignancy. Images were processed with CAD. IMPRESSION: No mammographic evidence of malignancy. A result letter of this screening mammogram will be mailed directly to the patient. RECOMMENDATION: Screening mammogram in one year. (Code:SM-B-01Y) BI-RADS CATEGORY  1:  Negative. Electronically Signed   By: Audie Pinto M.D.   On: 07/14/2020 13:11    No results found.  DG Bone Density  Result Date: 07/13/2020 EXAM: DUAL X-RAY ABSORPTIOMETRY (DXA) FOR BONE MINERAL DENSITY IMPRESSION: Your patient Kamora Spooner completed a BMD test on 07/13/2020 using the [place] (software version: 14.10) manufactured by [employer]. The following summarizes the results of our evaluation. Technologist: ecj PATIENT BIOGRAPHICAL: Name: Tayra, Dawe Patient ID: [id number] Birth Date: 1945-10-23 Height: 64.0 in. Gender: Female Exam Date: 07/13/2020 Weight: 253.0 lbs. Indications: Postmenopausal, Hysterectomy, Advanced Age Fractures: Treatments: DENSITOMETRY RESULTS: Site      Region     Measured Date Measured Age WHO Classification Young Adult T-score BMD         %Change vs. Previous Significant Change (*) AP Spine L3-L4 07/13/2020 73.6 Normal -0.4 1.164 g/cm2 1.5% - AP Spine L3-L4 05/07/2018 71.4 Normal -0.6 1.147 g/cm2 - - DualFemur Neck Left 07/13/2020 73.6 Normal -1.0 0.901 g/cm2 2.9% - DualFemur Neck Left 05/07/2018 71.4 Osteopenia -1.2 0.876 g/cm2 - - DualFemur Total Mean 07/13/2020 73.6 Normal 0.0 1.012 g/cm2 2.5% Yes DualFemur Total Mean 05/07/2018 71.4 Normal -0.2 0.987 g/cm2 - - ASSESSMENT: The BMD measured at Femur Neck Left is 0.901 g/cm2 with a T-score of -1.0. This patient is considered normal according to [facility]) criteria. The scan quality is good. L-1-2 was excluded due to degenerative changes. Compared with prior study, there has been no significant change in the spine. Compared with prior study, there has been no significant change in the total hip. World Pharmacologist [facility]) criteria for post-menopausal, Caucasian Women: Normal:                   T-score at or above -1 SD Osteopenia/low bone mass: T-score between -1 and -2.5 SD Osteoporosis:             T-score at or below -2.5 SD RECOMMENDATIONS: 1. All patients  should optimize calcium and vitamin D intake. 2. Consider FDA-approved medical therapies in postmenopausal women and men aged 52 years and older, based on the following: a. A hip or vertebral(clinical or morphometric) fracture b. T-score < -2.5 at the femoral neck or spine after appropriate evaluation to exclude secondary causes c. Low bone mass (T-score between -1.0 and -2.5 at the femoral neck or spine) and a 10-year probability of a hip fracture > 3% or a 10-year probability of a major osteoporosis-related fracture > 20% based on the [place]-adapted WHO algorithm 3. Clinician judgment and/or patient preferences may indicate treatment for people with 10-year fracture probabilities above or below these levels FOLLOW-UP: People with diagnosed cases of osteoporosis or at high risk for fracture should have regular bone mineral density tests. For patients eligible for Medicare, routine testing is allowed once every 2 years. The testing frequency can be increased to one year for patients who have rapidly progressing disease, those who are receiving or discontinuing medical therapy to  restore bone mass, or have additional risk factors. I have reviewed this report, and agree with the above findings. [facility] Electronically Signed   By: Rolm Baptise M.D.   On: 07/13/2020 11:51   MM 3D SCREEN BREAST BILATERAL  Result Date: 07/14/2020 CLINICAL DATA:  Screening. EXAM: DIGITAL SCREENING BILATERAL MAMMOGRAM WITH TOMO AND CAD COMPARISON:  Previous exam(s). ACR Breast Density Category b: There are scattered areas of fibroglandular density. FINDINGS: There are no findings suspicious for malignancy. Images were processed with CAD. IMPRESSION: No mammographic evidence of malignancy. A result letter of this screening mammogram will be mailed directly to the patient. RECOMMENDATION: Screening mammogram in one year. (Code:SM-B-01Y) BI-RADS CATEGORY  1: Negative. Electronically Signed   By: Audie Pinto M.D.   On:  07/14/2020 13:11      Assessment and Plan: Patient Active Problem List   Diagnosis Date Noted  . Abdominal pain 11/07/2019  . GERD (gastroesophageal reflux disease) 11/07/2019  . Dizziness 02/04/2019  . Benign essential HTN 10/23/2018  . Functional systolic murmur 54/65/0354  . Developmental delay disorder 02/06/2017  . Calculus of gallbladder without cholecystitis without obstruction 01/20/2017  . Localized edema 04/15/2016  . Microscopic hematuria 04/15/2016  . OSA on CPAP 11/18/2015  . Urge incontinence of urine 05/12/2015  . Diverticulosis of colon 05/11/2015  . Anxiety, generalized 05/11/2015      The patient does  tolerate PAP and reports significant benefit from PAP use. The patient's sister was reminded how to clean the CPAP and advised to keep the mask on when using the restroom but mask sure she can see to walk safely. Her niece is to put the mask on the patient at the start of the night... The compliance is excellent. The AHI is elevated at 14.7 due to very high mask leak. .   1. OSA-continue nightly use.  Keep mask on when  using restroom. 2. CPAP use counseling- Pt reports good compliance with CPAP therapy. Cleaning machine by hand, and changing filters and tubing as directed. Denies headaches, sinus issues, palpitations, or hemoptysis.   3. Morbid obesity- counseled as to weight loss  General Counseling: I have discussed the findings of the evaluation and examination with [place].  I have also discussed any further diagnostic evaluation thatmay be needed or ordered today. Wyoma verbalizes understanding of the findings of todays visit. We also reviewed her medications today and discussed drug interactions and side effects including but not limited excessive drowsiness and altered mental states. We also discussed that there is always a risk not just to her but also people around her. she has been encouraged to call the office with any questions or concerns that should arise  related to todays visit.  No orders of the defined types were placed in this encounter.       I have personally obtained a history, examined the patient, evaluated laboratory and imaging results, formulated the assessment and plan and placed orders.  This patient was seen today by Tressie Ellis, PA-C in collaboration with Dr. Devona Konig.    Richelle Ito Saunders Glance, PhD, FAASM  Diplomate, American Board of Sleep Medicine    Allyne Gee, MD [facility] [place] Diplomate ABMS Pulmonary and Critical Care Medicine Sleep medicine

## 2020-08-10 NOTE — Patient Instructions (Signed)

## 2020-08-24 ENCOUNTER — Other Ambulatory Visit: Payer: Self-pay | Admitting: Gastroenterology

## 2020-08-24 DIAGNOSIS — R1084 Generalized abdominal pain: Secondary | ICD-10-CM

## 2020-08-26 DIAGNOSIS — G4733 Obstructive sleep apnea (adult) (pediatric): Secondary | ICD-10-CM | POA: Diagnosis not present

## 2020-09-24 ENCOUNTER — Other Ambulatory Visit: Payer: Self-pay | Admitting: Internal Medicine

## 2020-09-24 ENCOUNTER — Other Ambulatory Visit: Payer: Self-pay | Admitting: Gastroenterology

## 2020-09-24 DIAGNOSIS — R1084 Generalized abdominal pain: Secondary | ICD-10-CM

## 2020-09-24 DIAGNOSIS — R030 Elevated blood-pressure reading, without diagnosis of hypertension: Secondary | ICD-10-CM

## 2020-11-21 ENCOUNTER — Other Ambulatory Visit: Payer: Self-pay | Admitting: Gastroenterology

## 2020-11-21 ENCOUNTER — Other Ambulatory Visit: Payer: Self-pay | Admitting: Internal Medicine

## 2020-11-21 DIAGNOSIS — R1084 Generalized abdominal pain: Secondary | ICD-10-CM

## 2020-11-21 NOTE — Telephone Encounter (Signed)
Requested medication (s) are due for refill today: yes  Requested medication (s) are on the active medication list: yes  Last refill:  07/03/20  Future visit scheduled: no  Notes to clinic:  med not delegated to RF    Requested Prescriptions  Pending Prescriptions Disp Refills   meclizine (ANTIVERT) 12.5 MG tablet [Pharmacy Med Name: meclizine 12.5 mg tablet] 30 tablet 5    Sig: TAKE ONE TABLET BY MOUTH THREE TIMES DAILY AS NEEDED FOR DIZZINESS      Not Delegated - Gastroenterology: Antiemetics Failed - 11/21/2020 11:12 AM      Failed - This refill cannot be delegated      Passed - Valid encounter within last 6 months    Recent Outpatient Visits           5 months ago Benign essential HTN   [facility] [name], MD   1 year ago Benign essential HTN   [facility] [name], MD   1 year ago Benign essential HTN   [facility] [name], MD   1 year ago Gastritis without bleeding, unspecified chronicity, unspecified gastritis type   [facility] [name], MD   2 years ago Rectal bleeding   [facility] [name], MD

## 2020-11-23 DIAGNOSIS — G4733 Obstructive sleep apnea (adult) (pediatric): Secondary | ICD-10-CM | POA: Diagnosis not present

## 2020-12-09 ENCOUNTER — Emergency Department: Payer: Medicare Other

## 2020-12-09 ENCOUNTER — Other Ambulatory Visit: Payer: Self-pay

## 2020-12-09 ENCOUNTER — Emergency Department
Admission: EM | Admit: 2020-12-09 | Discharge: 2020-12-09 | Disposition: A | Discharge status: Home / Self Care | Payer: Medicare Other | Attending: Emergency Medicine | Admitting: Emergency Medicine

## 2020-12-09 DIAGNOSIS — K5750 Diverticulosis of both small and large intestine without perforation or abscess without bleeding: Secondary | ICD-10-CM | POA: Diagnosis not present

## 2020-12-09 DIAGNOSIS — K625 Hemorrhage of anus and rectum: Secondary | ICD-10-CM | POA: Insufficient documentation

## 2020-12-09 DIAGNOSIS — Z79899 Other long term (current) drug therapy: Secondary | ICD-10-CM | POA: Insufficient documentation

## 2020-12-09 DIAGNOSIS — I10 Essential (primary) hypertension: Secondary | ICD-10-CM | POA: Insufficient documentation

## 2020-12-09 DIAGNOSIS — K5731 Diverticulosis of large intestine without perforation or abscess with bleeding: Secondary | ICD-10-CM | POA: Diagnosis not present

## 2020-12-09 DIAGNOSIS — K219 Gastro-esophageal reflux disease without esophagitis: Secondary | ICD-10-CM | POA: Insufficient documentation

## 2020-12-09 DIAGNOSIS — K8020 Calculus of gallbladder without cholecystitis without obstruction: Secondary | ICD-10-CM | POA: Diagnosis not present

## 2020-12-09 DIAGNOSIS — K921 Melena: Secondary | ICD-10-CM

## 2020-12-09 DIAGNOSIS — K8080 Other cholelithiasis without obstruction: Secondary | ICD-10-CM | POA: Diagnosis not present

## 2020-12-09 DIAGNOSIS — K5790 Diverticulosis of intestine, part unspecified, without perforation or abscess without bleeding: Secondary | ICD-10-CM | POA: Diagnosis not present

## 2020-12-09 DIAGNOSIS — R1013 Epigastric pain: Secondary | ICD-10-CM | POA: Diagnosis present

## 2020-12-09 LAB — CBC
HCT: 43.8 % (ref 36.0–46.0)
Hemoglobin: 13.4 g/dL (ref 12.0–15.0)
MCH: 28 pg (ref 26.0–34.0)
MCHC: 30.6 g/dL (ref 30.0–36.0)
MCV: 91.6 fL (ref 80.0–100.0)
Platelets: 219 10*3/uL (ref 150–400)
RBC: 4.78 MIL/uL (ref 3.87–5.11)
RDW: 15.3 % (ref 11.5–15.5)
WBC: 5.5 10*3/uL (ref 4.0–10.5)
nRBC: 0 % (ref 0.0–0.2)

## 2020-12-09 LAB — COMPREHENSIVE METABOLIC PANEL
ALT: 11 U/L (ref 0–44)
AST: 13 U/L — ABNORMAL LOW (ref 15–41)
Albumin: 3.9 g/dL (ref 3.5–5.0)
Alkaline Phosphatase: 85 U/L (ref 38–126)
Anion gap: 10 (ref 5–15)
BUN: 19 mg/dL (ref 8–23)
CO2: 29 mmol/L (ref 22–32)
Calcium: 9.5 mg/dL (ref 8.9–10.3)
Chloride: 102 mmol/L (ref 98–111)
Creatinine, Ser: 0.94 mg/dL (ref 0.44–1.00)
GFR, Estimated: >60 mL/min (ref >60)
Glucose, Bld: 106 mg/dL — ABNORMAL HIGH (ref 70–99)
Potassium: 3.7 mmol/L (ref 3.5–5.1)
Sodium: 141 mmol/L (ref 135–145)
Total Bilirubin: 0.6 mg/dL (ref 0.3–1.2)
Total Protein: 7.9 g/dL (ref 6.5–8.1)

## 2020-12-09 LAB — TYPE AND SCREEN
ABO/RH(D): O POS
Antibody Screen: NEGATIVE

## 2020-12-09 MED ORDER — POLYETHYLENE GLYCOL 3350 17 GM/SCOOP PO POWD: 17.0000 g | Freq: Once | ORAL | 0 refills | Status: AC

## 2020-12-09 NOTE — ED Triage Notes (Signed)
Pt c/o epigastric pain with red blood in stools since yesterday. Pt is in NAD on arrival

## 2020-12-09 NOTE — ED Notes (Signed)
EDP at bedside  

## 2020-12-09 NOTE — ED Provider Notes (Signed)
[facility] Emergency Department Provider Note   ____________________________________________   Event Date/Time   First MD Initiated Contact with Patient 12/09/20 1807     (approximate)  I have reviewed the triage vital signs and the nursing notes.   HISTORY  Chief Complaint GI Bleeding    HPI Taylor Johnson is a 75 y.o. female with the below stated past medical history who presents for midepigastric pain and hematochezia that began earlier today with 1 episode of bloody bowel movement.  Patient describes intermittent midepigastric abdominal pain that is worse with "stress".  Patient denies any other exacerbating or relieving factors.  Patient states that she has this pain in the absence of any hematochezia and does not feel that they are related.  Patient currently denies any vision changes, tinnitus, difficulty speaking, facial droop, sore throat, chest pain, shortness of breath, nausea/vomiting/diarrhea, dysuria, or weakness/numbness/paresthesias in any extremity         Past Medical History:  Diagnosis Date  . Abdominal pain 01/27/2017  . Anxiety   . Anxiety, generalized 05/11/2015  . Cough 06/02/2016   Chronic - followed by Pulmonary  . Cough 06/02/2016   Chronic - followed by Pulmonary  . Depression   . Developmental delay   . Diverticulitis   . Diverticulosis of colon 05/11/2015  . Dysphagia 10/15/2015   Routine Ba Swallow normal; rec modified barium swallow   . GERD (gastroesophageal reflux disease)   . Hematochezia 11/07/2019  . Hypoxia 09/12/2015  . Leg swelling   . Localized edema 04/15/2016  . Microscopic hematuria 04/15/2016  . Nausea and vomiting 01/29/2017  . Orthostatic hypotension 03/16/2017  . OSA on CPAP 11/18/2015   CPAP @ 18 cm H2O started 02/2016  . Rectal bleeding 10/15/2018  . Urge incontinence of urine 05/12/2015    Patient Active Problem List   Diagnosis Date Noted  . CPAP use counseling 08/10/2020  . Morbid obesity  ([place]) 08/10/2020  . Abdominal pain 11/07/2019  . GERD (gastroesophageal reflux disease) 11/07/2019  . Dizziness 02/04/2019  . Benign essential HTN 10/23/2018  . Functional systolic murmur 16/04/9603  . Developmental delay disorder 02/06/2017  . Calculus of gallbladder without cholecystitis without obstruction 01/20/2017  . Localized edema 04/15/2016  . Microscopic hematuria 04/15/2016  . OSA on CPAP 11/18/2015  . Urge incontinence of urine 05/12/2015  . Diverticulosis of colon 05/11/2015  . Anxiety, generalized 05/11/2015    Past Surgical History:  Procedure Laterality Date  . ABDOMINAL HYSTERECTOMY    . BREAST BIOPSY Left    neg  . COLON SURGERY    . COLONOSCOPY WITH PROPOFOL N/A 12/13/2019   Procedure: COLONOSCOPY WITH PROPOFOL;  Surgeon: Lin Landsman, MD;  Location: [facility] ENDOSCOPY;  Service: Endoscopy;  Laterality: N/A;  . ESOPHAGOGASTRODUODENOSCOPY (EGD) WITH PROPOFOL N/A 01/29/2017   Procedure: ESOPHAGOGASTRODUODENOSCOPY (EGD) WITH PROPOFOL;  Surgeon: Wilford Corner, MD;  Location: [facility] ENDOSCOPY;  Service: Endoscopy;  Laterality: N/A;    Prior to Admission medications   Medication Sig Start Date End Date Taking? Authorizing Provider  dicyclomine (BENTYL) 10 MG capsule TAKE TWO TABLETS BY MOUTH FOUR TIMES DAILY Patient taking differently: Take 20 mg by mouth in the morning, at noon, in the evening, and at bedtime. 11/23/20  Yes Vanga, Tally Due, MD  hydrALAZINE (APRESOLINE) 25 MG tablet TAKE ONE TABLET BY MOUTH THREE TIMES DAILY Patient taking differently: Take 25 mg by mouth 3 (three) times daily. 09/24/20  Yes [name], MD  metoCLOPramide (REGLAN) 5 MG tablet TAKE  ONE TABLET BY MOUTH EVERY MORNING, NOON, EVENING Patient taking differently: Take 5 mg by mouth 3 (three) times daily before meals. 07/06/20  Yes [name], MD  polyethylene glycol powder (MIRALAX) 17 GM/SCOOP powder Take 17 g by mouth once for 1 dose. 12/09/20 12/09/20 Yes Fama Muenchow, Vista Lawman,  MD  meclizine (ANTIVERT) 12.5 MG tablet TAKE ONE TABLET BY MOUTH THREE TIMES DAILY AS NEEDED FOR DIZZINESS Patient taking differently: Take 12.5 mg by mouth 3 (three) times daily as needed for dizziness. 07/03/20   [name], MD    Allergies Patient has no known allergies.  Family History  Problem Relation Age of Onset  . Hypertension Mother   . Kidney disease Mother   . Dementia Mother   . Cancer Father        lung  . Bladder Cancer Neg Hx   . Breast cancer Neg Hx     Social History Social History   Tobacco Use  . Smoking status: Never Smoker  . Smokeless tobacco: Never Used  . Tobacco comment: smoking cessation materials not required  Vaping Use  . Vaping Use: Never used  Substance Use Topics  . Alcohol use: No    Alcohol/week: 0.0 standard drinks  . Drug use: No    Review of Systems Constitutional: No fever/chills Eyes: No visual changes. ENT: No sore throat. Cardiovascular: Denies chest pain. Respiratory: Denies shortness of breath. Gastrointestinal: Endorses hematochezia.  Endorses epigastric abdominal pain.  No nausea, no vomiting.  No diarrhea. Genitourinary: Negative for dysuria. Musculoskeletal: Negative for acute arthralgias Skin: Negative for rash. Neurological: Negative for headaches, weakness/numbness/paresthesias in any extremity Psychiatric: Negative for suicidal ideation/homicidal ideation   ____________________________________________   PHYSICAL EXAM:  VITAL SIGNS: ED Triage Vitals [12/09/20 1601]  Enc Vitals Group     BP (!) 166/72     Pulse Rate 83     Resp 17     Temp 98.8 F (37.1 C)     Temp Source Oral     SpO2 94 %     Weight 260 lb (117.9 kg)     Height 5\' 8"  (1.727 m)     Head Circumference      Peak Flow      Pain Score 10     Pain Loc      Pain Edu?      Excl. in [place]?    Constitutional: Alert and oriented. Well appearing and in no acute distress. Eyes: Conjunctivae are normal. PERRL. Head: Atraumatic. Nose:  No congestion/rhinnorhea. Mouth/Throat: Mucous membranes are moist. Neck: No stridor Cardiovascular: Grossly normal heart sounds.  Good peripheral circulation. Respiratory: Normal respiratory effort.  No retractions. Gastrointestinal: Soft and mild midepigastric tenderness to palpation. No distention. Musculoskeletal: No obvious deformities Neurologic:  Normal speech and language. No gross focal neurologic deficits are appreciated. Skin:  Skin is warm and dry. No rash noted. Psychiatric: Mood and affect are normal. Speech and behavior are normal.  ____________________________________________   LABS (all labs ordered are listed, but only abnormal results are displayed)  Labs Reviewed  COMPREHENSIVE METABOLIC PANEL - Abnormal; Notable for the following components:      Result Value   Glucose, Bld 106 (*)    AST 13 (*)    All other components within normal limits  CBC  TYPE AND SCREEN   ____________________________________________  EKG  ED ECG REPORT I, Naaman Plummer, the attending physician, personally viewed and interpreted this ECG.  Date: 12/09/2020 EKG Time: 1605 Rate: 79 Rhythm:  normal sinus rhythm QRS Axis: normal Intervals: normal ST/T Wave abnormalities: normal Narrative Interpretation: no evidence of acute ischemia  ____________________________________________  RADIOLOGY  ED MD interpretation: CT of the abdomen and pelvis without contrast shows no evidence of acute abnormalities.  Official radiology report(s): CT Abdomen Pelvis Wo Contrast  Result Date: 12/09/2020 CLINICAL DATA:  Epigastric abdominal pain EXAM: CT ABDOMEN AND PELVIS WITHOUT CONTRAST TECHNIQUE: Multidetector CT imaging of the abdomen and pelvis was performed following the standard protocol without IV contrast. COMPARISON:  11/04/2019 FINDINGS: Lower chest: Multiple thin wall cysts are seen within the visualized lung bases, nonspecific but unchanged from prior examination. Small pericardial  effusion is present. Global cardiac size is mildly enlarged. Hepatobiliary: Cholelithiasis without pericholecystic inflammatory change noted. Liver unremarkable. No intra or extrahepatic biliary ductal dilation. Pancreas: Unremarkable Spleen: Unremarkable Adrenals/Urinary Tract: Adrenal glands are unremarkable. Kidneys are normal, without renal calculi, focal lesion, or hydronephrosis. Bladder is unremarkable. Stomach/Bowel: Several large duodenal diverticula are identified containing intraluminal debris without surrounding inflammatory change. Stomach, small bowel, and appendix are otherwise unremarkable. There is severe pancolonic diverticulosis noted without superimposed inflammatory change. No evidence of obstruction. No free intraperitoneal gas or fluid. Vascular/Lymphatic: No significant vascular findings are present. No enlarged abdominal or pelvic lymph nodes. Reproductive: Multiple involuted calcified fibroids are seen within the uterus. The pelvic organs are otherwise unremarkable. Other: No abdominal wall hernia. Musculoskeletal: The osseous structures are age-appropriate. No acute bone abnormality. No lytic or blastic bone lesions are identified. IMPRESSION: No acute intra-abdominal pathology identified. No definite radiographic explanation for the patient's reported symptoms. Cholelithiasis Prominent duodenal diverticula again noted without superimposed inflammatory change. Severe pancolonic diverticulosis without superimposed inflammatory change. Stable cardiomegaly.  Stable small pericardial effusion. Electronically Signed   By: Fidela Salisbury MD   On: 12/09/2020 19:21    ____________________________________________   PROCEDURES  Procedure(s) performed (including Critical Care):  .1-3 Lead EKG Interpretation Performed by: Naaman Plummer, MD Authorized by: Naaman Plummer, MD     Interpretation: normal     ECG rate:  63   ECG rate assessment: normal     Rhythm: sinus rhythm      Ectopy: none     Conduction: normal       ____________________________________________   INITIAL IMPRESSION / ASSESSMENT AND PLAN / ED COURSE  As part of my medical decision making, I reviewed the following data within the [place] notes reviewed and incorporated, Labs reviewed, EKG interpreted, Old chart reviewed, Radiograph reviewed and Notes from prior ED visits reviewed and incorporated        Given history and exam patients presentation most consistent with Lower GI bleed possibly secondary to hemorrhoid or other nonemergent cause of bleeding. I have low suspicion for Aortoenteric fistula, Upper GI Bleed, IBD, Mesenteric Ischemia, Rectal foreign body or ulcer.  Workup: CBC, BMP, PT/INR, Type and Screen CT of the abdomen and pelvis shows extensive colonic diverticulosis likely explaining her episode of hematochezia Disposition: Discharge. Hemodynamically stable. SRP and prompt PCP/GI follow up.      ____________________________________________   FINAL CLINICAL IMPRESSION(S) / ED DIAGNOSES  Final diagnoses:  Hematochezia  Diverticulosis of colon with hemorrhage     ED Discharge Orders         Ordered    polyethylene glycol powder (MIRALAX) 17 GM/SCOOP powder   Once        12/09/20 1950           Note:  This document was prepared using Dragon voice  recognition software and may include unintentional dictation errors.   Naaman Plummer, MD 12/09/20 [phone]

## 2020-12-09 NOTE — ED Triage Notes (Signed)
First Nurse Note:  Arrives with c/o blood in stool and stomach ache.  AAOx3.  Skin warm and dry. NAD

## 2020-12-09 NOTE — Discharge Instructions (Signed)
Please use MiraLAX for any continued constipation.  You may start at one half capful twice a day and decrease or increase by a quarter capful as needed for 1 soft bowel movement per day.

## 2020-12-09 NOTE — ED Notes (Signed)
Pt to ED c/o blood in stool since yesterday. States is red colored. Denies pain. States feels dizzy when stands up. Denies vision changes. Daughter at bedside. Pt alert and oriented.

## 2020-12-18 ENCOUNTER — Other Ambulatory Visit: Payer: Self-pay | Admitting: Gastroenterology

## 2020-12-18 ENCOUNTER — Other Ambulatory Visit: Payer: Self-pay | Admitting: Internal Medicine

## 2020-12-18 DIAGNOSIS — R1084 Generalized abdominal pain: Secondary | ICD-10-CM

## 2020-12-18 DIAGNOSIS — K2970 Gastritis, unspecified, without bleeding: Secondary | ICD-10-CM

## 2020-12-18 DIAGNOSIS — R030 Elevated blood-pressure reading, without diagnosis of hypertension: Secondary | ICD-10-CM

## 2020-12-21 ENCOUNTER — Other Ambulatory Visit: Payer: Self-pay

## 2020-12-21 ENCOUNTER — Emergency Department
Admission: EM | Admit: 2020-12-21 | Discharge: 2020-12-21 | Disposition: A | Discharge status: Home / Self Care | Payer: Medicare Other | Attending: Emergency Medicine | Admitting: Emergency Medicine

## 2020-12-21 ENCOUNTER — Emergency Department: Payer: Medicare Other

## 2020-12-21 DIAGNOSIS — K8020 Calculus of gallbladder without cholecystitis without obstruction: Secondary | ICD-10-CM | POA: Diagnosis not present

## 2020-12-21 DIAGNOSIS — Z79899 Other long term (current) drug therapy: Secondary | ICD-10-CM | POA: Diagnosis not present

## 2020-12-21 DIAGNOSIS — R109 Unspecified abdominal pain: Secondary | ICD-10-CM | POA: Diagnosis not present

## 2020-12-21 DIAGNOSIS — R1032 Left lower quadrant pain: Secondary | ICD-10-CM | POA: Diagnosis not present

## 2020-12-21 DIAGNOSIS — N390 Urinary tract infection, site not specified: Secondary | ICD-10-CM | POA: Diagnosis not present

## 2020-12-21 DIAGNOSIS — R6889 Other general symptoms and signs: Secondary | ICD-10-CM | POA: Diagnosis not present

## 2020-12-21 DIAGNOSIS — M549 Dorsalgia, unspecified: Secondary | ICD-10-CM | POA: Diagnosis not present

## 2020-12-21 DIAGNOSIS — I10 Essential (primary) hypertension: Secondary | ICD-10-CM | POA: Insufficient documentation

## 2020-12-21 DIAGNOSIS — K5750 Diverticulosis of both small and large intestine without perforation or abscess without bleeding: Secondary | ICD-10-CM | POA: Diagnosis not present

## 2020-12-21 DIAGNOSIS — Z743 Need for continuous supervision: Secondary | ICD-10-CM | POA: Diagnosis not present

## 2020-12-21 DIAGNOSIS — K449 Diaphragmatic hernia without obstruction or gangrene: Secondary | ICD-10-CM | POA: Diagnosis not present

## 2020-12-21 DIAGNOSIS — K5900 Constipation, unspecified: Secondary | ICD-10-CM

## 2020-12-21 LAB — CBC
HCT: 37.4 % (ref 36.0–46.0)
Hemoglobin: 11.8 g/dL — ABNORMAL LOW (ref 12.0–15.0)
MCH: 28.9 pg (ref 26.0–34.0)
MCHC: 31.6 g/dL (ref 30.0–36.0)
MCV: 91.4 fL (ref 80.0–100.0)
Platelets: 173 10*3/uL (ref 150–400)
RBC: 4.09 MIL/uL (ref 3.87–5.11)
RDW: 15.9 % — ABNORMAL HIGH (ref 11.5–15.5)
WBC: 7.1 10*3/uL (ref 4.0–10.5)
nRBC: 0 % (ref 0.0–0.2)

## 2020-12-21 LAB — COMPREHENSIVE METABOLIC PANEL
ALT: 11 U/L (ref 0–44)
AST: 16 U/L (ref 15–41)
Albumin: 3.7 g/dL (ref 3.5–5.0)
Alkaline Phosphatase: 75 U/L (ref 38–126)
Anion gap: 5 (ref 5–15)
BUN: 23 mg/dL (ref 8–23)
CO2: 27 mmol/L (ref 22–32)
Calcium: 9.1 mg/dL (ref 8.9–10.3)
Chloride: 104 mmol/L (ref 98–111)
Creatinine, Ser: 0.96 mg/dL (ref 0.44–1.00)
GFR, Estimated: >60 mL/min (ref >60)
Glucose, Bld: 115 mg/dL — ABNORMAL HIGH (ref 70–99)
Potassium: 4.3 mmol/L (ref 3.5–5.1)
Sodium: 136 mmol/L (ref 135–145)
Total Bilirubin: 0.7 mg/dL (ref 0.3–1.2)
Total Protein: 7.6 g/dL (ref 6.5–8.1)

## 2020-12-21 LAB — URINALYSIS, COMPLETE (UACMP) WITH MICROSCOPIC
Bilirubin Urine: NEGATIVE
Glucose, UA: NEGATIVE mg/dL
Ketones, ur: NEGATIVE mg/dL
Nitrite: NEGATIVE
Protein, ur: NEGATIVE mg/dL
Specific Gravity, Urine: 1.018 (ref 1.005–1.030)
pH: 5 (ref 5.0–8.0)

## 2020-12-21 LAB — LIPASE, BLOOD: Lipase: 17 U/L (ref 11–51)

## 2020-12-21 MED ORDER — FOSFOMYCIN TROMETHAMINE 3 G PO PACK
3.0000 g | PACK | Freq: Once | ORAL | Status: AC
  Administered 2020-12-21: 3 g via ORAL
  Filled 2020-12-21: qty 3

## 2020-12-21 MED ORDER — POLYETHYLENE GLYCOL 3350 17 GM/SCOOP PO POWD: 17.0000 g | Freq: Every day | ORAL | 0 refills | Status: DC | PRN

## 2020-12-21 MED ORDER — HYDROCODONE-ACETAMINOPHEN 5-325 MG PO TABS: 1.0000 | ORAL_TABLET | ORAL | 0 refills | Status: DC | PRN

## 2020-12-21 NOTE — ED Notes (Signed)
Pt to ct 

## 2020-12-21 NOTE — ED Provider Notes (Signed)
[facility] Emergency Department Provider Note  Time seen: 8:39 PM  I have reviewed the triage vital signs and the nursing notes.   HISTORY  Chief Complaint Abdominal Pain   HPI Taylor Johnson is a 75 y.o. female with a past medical history of anxiety, abdominal pain, diverticulosis, gastric reflux, obesity, presents to the emergency department for left lower quadrant abdominal pain.  According to the patient this morning she developed left lower quadrant abdominal pain, seemed to improve and then tonight worsened once again so she came to the emergency department for evaluation.  Patient states she was on medication for constipation but stopped taking it because she was using the bathroom too much.  Patient denies any nausea or vomiting.  No fever.  No dysuria.  Is unable to tell me when her last bowel movement was but thinks it was today.   Past Medical History:  Diagnosis Date  . Abdominal pain 01/27/2017  . Anxiety   . Anxiety, generalized 05/11/2015  . Cough 06/02/2016   Chronic - followed by Pulmonary  . Cough 06/02/2016   Chronic - followed by Pulmonary  . Depression   . Developmental delay   . Diverticulitis   . Diverticulosis of colon 05/11/2015  . Dysphagia 10/15/2015   Routine Ba Swallow normal; rec modified barium swallow   . GERD (gastroesophageal reflux disease)   . Hematochezia 11/07/2019  . Hypoxia 09/12/2015  . Leg swelling   . Localized edema 04/15/2016  . Microscopic hematuria 04/15/2016  . Nausea and vomiting 01/29/2017  . Orthostatic hypotension 03/16/2017  . OSA on CPAP 11/18/2015   CPAP @ 18 cm H2O started 02/2016  . Rectal bleeding 10/15/2018  . Urge incontinence of urine 05/12/2015    Patient Active Problem List   Diagnosis Date Noted  . CPAP use counseling 08/10/2020  . Morbid obesity ([place]) 08/10/2020  . Abdominal pain 11/07/2019  . GERD (gastroesophageal reflux disease) 11/07/2019  . Dizziness 02/04/2019  . Benign essential HTN  10/23/2018  . Functional systolic murmur 81/19/1478  . Developmental delay disorder 02/06/2017  . Calculus of gallbladder without cholecystitis without obstruction 01/20/2017  . Localized edema 04/15/2016  . Microscopic hematuria 04/15/2016  . OSA on CPAP 11/18/2015  . Urge incontinence of urine 05/12/2015  . Diverticulosis of colon 05/11/2015  . Anxiety, generalized 05/11/2015    Past Surgical History:  Procedure Laterality Date  . ABDOMINAL HYSTERECTOMY    . BREAST BIOPSY Left    neg  . COLON SURGERY    . COLONOSCOPY WITH PROPOFOL N/A 12/13/2019   Procedure: COLONOSCOPY WITH PROPOFOL;  Surgeon: Lin Landsman, MD;  Location: [facility] ENDOSCOPY;  Service: Endoscopy;  Laterality: N/A;  . ESOPHAGOGASTRODUODENOSCOPY (EGD) WITH PROPOFOL N/A 01/29/2017   Procedure: ESOPHAGOGASTRODUODENOSCOPY (EGD) WITH PROPOFOL;  Surgeon: Wilford Corner, MD;  Location: [facility] ENDOSCOPY;  Service: Endoscopy;  Laterality: N/A;    Prior to Admission medications   Medication Sig Start Date End Date Taking? Authorizing Provider  dicyclomine (BENTYL) 10 MG capsule TAKE TWO TABLETS BY MOUTH FOUR TIMES DAILY 12/21/20   Lin Landsman, MD  hydrALAZINE (APRESOLINE) 25 MG tablet TAKE ONE TABLET BY MOUTH THREE TIMES DAILY 12/18/20   [name], MD  meclizine (ANTIVERT) 12.5 MG tablet TAKE ONE TABLET BY MOUTH THREE TIMES DAILY AS NEEDED FOR DIZZINESS 12/18/20   [name], MD  metoCLOPramide (REGLAN) 5 MG tablet TAKE ONE TABLET BY MOUTH EVERY MORNING, NOON, EVENING 12/18/20   [name], MD  No Known Allergies  Family History  Problem Relation Age of Onset  . Hypertension Mother   . Kidney disease Mother   . Dementia Mother   . Cancer Father        lung  . Bladder Cancer Neg Hx   . Breast cancer Neg Hx     Social History Social History   Tobacco Use  . Smoking status: Never Smoker  . Smokeless tobacco: Never Used  . Tobacco comment: smoking cessation materials not required   Vaping Use  . Vaping Use: Never used  Substance Use Topics  . Alcohol use: No    Alcohol/week: 0.0 standard drinks  . Drug use: No    Review of Systems Constitutional: Negative for fever. Cardiovascular: Negative for chest pain. Respiratory: Negative for shortness of breath. Gastrointestinal: Left lower quadrant abdominal pain, now improved.  No nausea vomiting or diarrhea Genitourinary: Negative for urinary compaints Musculoskeletal: Negative for musculoskeletal complaints Neurological: Negative for headache All other ROS negative  ____________________________________________   PHYSICAL EXAM:  Constitutional: Alert and oriented. Well appearing and in no distress. Eyes: Normal exam ENT      Head: Normocephalic and atraumatic.      Mouth/Throat: Mucous membranes are moist. Cardiovascular: Normal rate, regular rhythm.  Respiratory: Normal respiratory effort without tachypnea nor retractions. Breath sounds are clear  Gastrointestinal: Soft, mild left lower quadrant abdominal tenderness palpation without rebound guarding or distention. Musculoskeletal: Nontender with normal range of motion in all extremities.  Neurologic:  Normal speech and language. No gross focal neurologic deficits Skin:  Skin is warm, dry and intact.  Psychiatric: Mood and affect are normal.  ____________________________________________    EKG  EKG viewed and interpreted by myself shows normal sinus rhythm at 77 bpm with a narrow QRS, normal axis, normal intervals, nonspecific ST changes.  ____________________________________________    RADIOLOGY  CT scan shows no acute abnormality.  ____________________________________________   INITIAL IMPRESSION / ASSESSMENT AND PLAN / ED COURSE  Pertinent labs & imaging results that were available during my care of the patient were reviewed by me and considered in my medical decision making (see chart for details).   Patient presents emergency department  for left lower quadrant abdominal pain which he states is more significant earlier and now is resolving.  Patient has very mild left lower quadrant tenderness otherwise benign abdomen.  EMS noted patient be quite hypertensive greater than 338 systolic.  Currently 250 systolic.  Patient states she was on medication for constipation but stopped taking it because it was making her use the bathroom too often.  Differential would include constipation, diverticulitis with the patient has a history of, UTI or other intra-abdominal pathology.  We will check labs, urine, proceed with CT imaging.  Patient agreeable to plan of care.  CT scan essentially negative for acute abnormality.  Discussed with the patient and sister trial of MiraLAX to help for any constipation type pains or intestinal pains.  I would like to check a urinalysis.  As long as the urine is normal we will ambulate the patient and likely discharge home.  Urine shows possible small UTI we will dose a one-time dose of fosfomycin and I have sent a urine culture.  Patient will be discharged home with PCP follow-up.  Taylor Johnson was evaluated in Emergency Department on 12/21/2020 for the symptoms described in the history of present illness. She was evaluated in the context of the global COVID-19 pandemic, which necessitated consideration that the patient might be at  risk for infection with the SARS-CoV-2 virus that causes COVID-19. Institutional protocols and algorithms that pertain to the evaluation of patients at risk for COVID-19 are in a state of rapid change based on information released by regulatory bodies including the CDC and federal and state organizations. These policies and algorithms were followed during the patient's care in the ED.  ____________________________________________   FINAL CLINICAL IMPRESSION(S) / ED DIAGNOSES  Left lower quadrant abdominal pain Urinary tract infection   Harvest Dark, MD 12/21/20 2304

## 2020-12-21 NOTE — ED Triage Notes (Signed)
Pt to ED via EMS from home, pt c/o abd pain that started this morning as well as back pain. Pt states she has been taking a stool softener but stopped because she was using the bathroom too much. Pt c/o dizziness when she stands also.

## 2020-12-23 LAB — URINE CULTURE

## 2021-01-16 ENCOUNTER — Other Ambulatory Visit: Payer: Self-pay

## 2021-01-16 DIAGNOSIS — I10 Essential (primary) hypertension: Secondary | ICD-10-CM | POA: Diagnosis not present

## 2021-01-16 DIAGNOSIS — R112 Nausea with vomiting, unspecified: Secondary | ICD-10-CM | POA: Diagnosis not present

## 2021-01-16 DIAGNOSIS — R197 Diarrhea, unspecified: Secondary | ICD-10-CM | POA: Insufficient documentation

## 2021-01-16 DIAGNOSIS — R001 Bradycardia, unspecified: Secondary | ICD-10-CM | POA: Diagnosis not present

## 2021-01-16 DIAGNOSIS — R1084 Generalized abdominal pain: Secondary | ICD-10-CM | POA: Diagnosis not present

## 2021-01-16 DIAGNOSIS — R110 Nausea: Secondary | ICD-10-CM | POA: Insufficient documentation

## 2021-01-16 DIAGNOSIS — R1030 Lower abdominal pain, unspecified: Secondary | ICD-10-CM | POA: Diagnosis present

## 2021-01-16 DIAGNOSIS — R109 Unspecified abdominal pain: Secondary | ICD-10-CM | POA: Diagnosis not present

## 2021-01-16 LAB — COMPREHENSIVE METABOLIC PANEL
ALT: 9 U/L (ref 0–44)
AST: 13 U/L — ABNORMAL LOW (ref 15–41)
Albumin: 3.6 g/dL (ref 3.5–5.0)
Alkaline Phosphatase: 81 U/L (ref 38–126)
Anion gap: 8 (ref 5–15)
BUN: 17 mg/dL (ref 8–23)
CO2: 27 mmol/L (ref 22–32)
Calcium: 9.3 mg/dL (ref 8.9–10.3)
Chloride: 103 mmol/L (ref 98–111)
Creatinine, Ser: 0.78 mg/dL (ref 0.44–1.00)
GFR, Estimated: >60 mL/min (ref >60)
Glucose, Bld: 94 mg/dL (ref 70–99)
Potassium: 3.8 mmol/L (ref 3.5–5.1)
Sodium: 138 mmol/L (ref 135–145)
Total Bilirubin: 0.6 mg/dL (ref 0.3–1.2)
Total Protein: 7.5 g/dL (ref 6.5–8.1)

## 2021-01-16 LAB — CBC
HCT: 39.7 % (ref 36.0–46.0)
Hemoglobin: 12.2 g/dL (ref 12.0–15.0)
MCH: 28.2 pg (ref 26.0–34.0)
MCHC: 30.7 g/dL (ref 30.0–36.0)
MCV: 91.7 fL (ref 80.0–100.0)
Platelets: 185 10*3/uL (ref 150–400)
RBC: 4.33 MIL/uL (ref 3.87–5.11)
RDW: 16 % — ABNORMAL HIGH (ref 11.5–15.5)
WBC: 5.9 10*3/uL (ref 4.0–10.5)
nRBC: 0 % (ref 0.0–0.2)

## 2021-01-16 LAB — LIPASE, BLOOD: Lipase: 19 U/L (ref 11–51)

## 2021-01-16 NOTE — ED Triage Notes (Signed)
Pt to ED POV for lower abd pain since yesterday. Denies n/v/d Hx diverticulitis, states feels similar

## 2021-01-17 ENCOUNTER — Emergency Department
Admission: EM | Admit: 2021-01-17 | Discharge: 2021-01-17 | Disposition: A | Discharge status: Home / Self Care | Payer: Medicare Other | Attending: Emergency Medicine | Admitting: Emergency Medicine

## 2021-01-17 ENCOUNTER — Emergency Department: Payer: Medicare Other

## 2021-01-17 DIAGNOSIS — R109 Unspecified abdominal pain: Secondary | ICD-10-CM | POA: Diagnosis not present

## 2021-01-17 DIAGNOSIS — R1084 Generalized abdominal pain: Secondary | ICD-10-CM | POA: Diagnosis not present

## 2021-01-17 DIAGNOSIS — R110 Nausea: Secondary | ICD-10-CM

## 2021-01-17 HISTORY — DX: Essential (primary) hypertension: I10

## 2021-01-17 LAB — TROPONIN I (HIGH SENSITIVITY): Troponin I (High Sensitivity): 9 ng/L (ref <18)

## 2021-01-17 MED ORDER — ONDANSETRON 4 MG PO TBDP: ORAL_TABLET | ORAL | 0 refills | Status: DC

## 2021-01-17 MED ORDER — IOHEXOL 300 MG/ML  SOLN
100.0000 mL | Freq: Once | INTRAMUSCULAR | Status: AC | PRN
  Administered 2021-01-17: 100 mL via INTRAVENOUS
  Filled 2021-01-17: qty 100

## 2021-01-17 NOTE — ED Provider Notes (Signed)
[facility] Emergency Department Provider Note  ____________________________________________   Event Date/Time   First MD Initiated Contact with Patient 01/17/21 0018     (approximate)  I have reviewed the triage vital signs and the nursing notes.   HISTORY  Chief Complaint Abdominal Pain  Level 5 caveat: The patient is a vague historian and some of the history is provided by her sister.  Her sister says this is normal.  HPI Taylor Johnson is a 75 y.o. female with medical history as listed below who presents for evaluation of 24 to 48 hours of gradually worsening lower abdominal pain.  She said that it seems to come and go a little bit and it feels "bubbly" and an achy pain.  She has had diverticulitis in the past and says it feels similar.  She has had nausea and decreased appetite but no vomiting but sometimes she "spits up".  She also reports a little bit of pain either in her lower chest or her upper abdomen that started a few hours prior to arrival.  Nothing particular makes the symptoms better or worse.  She sometimes has a little bit of diarrhea but that is chronic and she has not been constipated.  She has had no burning when she urinates.  The symptoms overall can be severe at times but currently she says she has no pain.     Past Medical History:  Diagnosis Date   Abdominal pain 01/27/2017   Anxiety    Anxiety, generalized 05/11/2015   Cough 06/02/2016   Chronic - followed by Pulmonary   Cough 06/02/2016   Chronic - followed by Pulmonary   Depression    Developmental delay    Diverticulitis    Diverticulosis of colon 05/11/2015   Dysphagia 10/15/2015   Routine Ba Swallow normal; rec modified barium swallow    GERD (gastroesophageal reflux disease)    Hematochezia 11/07/2019   Hypertension    Hypoxia 09/12/2015   Leg swelling    Localized edema 04/15/2016   Microscopic hematuria 04/15/2016   Nausea and vomiting 01/29/2017   Orthostatic  hypotension 03/16/2017   OSA on CPAP 11/18/2015   CPAP @ 18 cm H2O started 02/2016   Rectal bleeding 10/15/2018   Urge incontinence of urine 05/12/2015    Patient Active Problem List   Diagnosis Date Noted   CPAP use counseling 08/10/2020   Morbid obesity ([place]) 08/10/2020   Abdominal pain 11/07/2019   GERD (gastroesophageal reflux disease) 11/07/2019   Dizziness 02/04/2019   Benign essential HTN 10/23/2018   Functional systolic murmur 17/40/8144   Developmental delay disorder 02/06/2017   Calculus of gallbladder without cholecystitis without obstruction 01/20/2017   Localized edema 04/15/2016   Microscopic hematuria 04/15/2016   OSA on CPAP 11/18/2015   Urge incontinence of urine 05/12/2015   Diverticulosis of colon 05/11/2015   Anxiety, generalized 05/11/2015    Past Surgical History:  Procedure Laterality Date   ABDOMINAL HYSTERECTOMY     BREAST BIOPSY Left    neg   COLON SURGERY     COLONOSCOPY WITH PROPOFOL N/A 12/13/2019   Procedure: COLONOSCOPY WITH PROPOFOL;  Surgeon: Lin Landsman, MD;  Location: [facility] ENDOSCOPY;  Service: Endoscopy;  Laterality: N/A;   ESOPHAGOGASTRODUODENOSCOPY (EGD) WITH PROPOFOL N/A 01/29/2017   Procedure: ESOPHAGOGASTRODUODENOSCOPY (EGD) WITH PROPOFOL;  Surgeon: Wilford Corner, MD;  Location: [facility] ENDOSCOPY;  Service: Endoscopy;  Laterality: N/A;    Prior to Admission medications   Medication Sig Start Date End Date Taking? Authorizing  Provider  ondansetron (ZOFRAN ODT) 4 MG disintegrating tablet Allow 1-2 tablets to dissolve in your mouth every 8 hours as needed for nausea/vomiting 01/17/21  Yes Hinda Kehr, MD  dicyclomine (BENTYL) 10 MG capsule TAKE TWO TABLETS BY MOUTH FOUR TIMES DAILY 12/21/20   Lin Landsman, MD  hydrALAZINE (APRESOLINE) 25 MG tablet TAKE ONE TABLET BY MOUTH THREE TIMES DAILY 12/18/20   [name], MD  HYDROcodone-acetaminophen (NORCO/VICODIN) 5-325 MG tablet Take 1 tablet by mouth every 4 (four) hours as  needed for moderate pain. 12/21/20 12/21/21  Harvest Dark, MD  meclizine (ANTIVERT) 12.5 MG tablet TAKE ONE TABLET BY MOUTH THREE TIMES DAILY AS NEEDED FOR DIZZINESS 12/18/20   [name], MD  metoCLOPramide (REGLAN) 5 MG tablet TAKE ONE TABLET BY MOUTH EVERY MORNING, NOON, EVENING 12/18/20   [name], MD  polyethylene glycol powder (GLYCOLAX/MIRALAX) 17 GM/SCOOP powder Take 17 g by mouth daily as needed for moderate constipation. 12/21/20   Harvest Dark, MD    Allergies Patient has no known allergies.  Family History  Problem Relation Age of Onset   Hypertension Mother    Kidney disease Mother    Dementia Mother    Cancer Father        lung   Bladder Cancer Neg Hx    Breast cancer Neg Hx     Social History Social History   Tobacco Use   Smoking status: Never   Smokeless tobacco: Never   Tobacco comments:    smoking cessation materials not required  Vaping Use   Vaping Use: Never used  Substance Use Topics   Alcohol use: No    Alcohol/week: 0.0 standard drinks   Drug use: No    Review of Systems Level 5 caveat: The patient is a vague historian and some of the history is provided by her sister.  Her sister says this is normal.  Constitutional: No fever/chills Eyes: No visual changes. ENT: No sore throat. Cardiovascular: Possible chest pain, patient is not sure. Respiratory: Denies shortness of breath. Gastrointestinal: Positive for abdominal pain, nausea, and decreased appetite.  Negative for constipation. Genitourinary: Negative for dysuria. Musculoskeletal: Negative for neck pain.  Negative for back pain. Integumentary: Negative for rash. Neurological: Negative for headaches, focal weakness or numbness.   ____________________________________________   PHYSICAL EXAM:  VITAL SIGNS: ED Triage Vitals  Enc Vitals Group     BP 01/16/21 1643 (!) 163/81     Pulse Rate 01/16/21 1643 68     Resp 01/16/21 1643 20     Temp 01/16/21 1643 98.8 F  (37.1 C)     Temp Source 01/16/21 1643 Oral     SpO2 01/16/21 1643 93 %     Weight 01/16/21 1644 120 kg (264 lb 8.8 oz)     Height 01/16/21 1644 1.727 m (5\' 8" )     Head Circumference --      Peak Flow --      Pain Score 01/16/21 1643 8     Pain Loc --      Pain Edu? --      Excl. in [place]? --     Constitutional: Alert and oriented.  Eyes: Conjunctivae are normal.  Head: Atraumatic. Nose: No congestion/rhinnorhea. Mouth/Throat: Patient is wearing a mask. Neck: No stridor.  No meningeal signs.   Cardiovascular: Normal rate, regular rhythm. Good peripheral circulation. Respiratory: Normal respiratory effort.  No retractions. Gastrointestinal: Obese.  Soft with minimal tenderness to palpation at this time throughout the lower  abdomen, not localized in any 1 particular place.  No rebound and no guarding.  No tenderness to palpation of the epigastrium nor right upper quadrant with negative Murphy sign. Musculoskeletal: No lower extremity tenderness nor edema. No gross deformities of extremities. Neurologic:  Normal speech and language. No gross focal neurologic deficits are appreciated.  Skin:  Skin is warm, dry and intact.   ____________________________________________   LABS (all labs ordered are listed, but only abnormal results are displayed)  Labs Reviewed  COMPREHENSIVE METABOLIC PANEL - Abnormal; Notable for the following components:      Result Value   AST 13 (*)    All other components within normal limits  CBC - Abnormal; Notable for the following components:   RDW 16.0 (*)    All other components within normal limits  LIPASE, BLOOD  URINALYSIS, COMPLETE (UACMP) WITH MICROSCOPIC  TROPONIN I (HIGH SENSITIVITY)   ____________________________________________  EKG  ED ECG REPORT I, Hinda Kehr, the attending physician, personally viewed and interpreted this ECG.  Date: 01/17/2021 EKG Time: 1:10 AM Rate: 57 Rhythm: Borderline sinus bradycardia QRS Axis:  normal Intervals: normal ST/T Wave abnormalities: Non-specific ST segment / T-wave changes, but no clear evidence of acute ischemia. Narrative Interpretation: no definitive evidence of acute ischemia; does not meet STEMI criteria.  ____________________________________________  RADIOLOGY I, Hinda Kehr, personally viewed and evaluated these images (plain radiographs) as part of my medical decision making, as well as reviewing the written report by the radiologist.  ED MD interpretation: No acute abnormalities identified on CT scan.  Official radiology report(s): CT ABDOMEN PELVIS W CONTRAST  Result Date: 01/17/2021 CLINICAL DATA:  Lower abdominal pain since yesterday. EXAM: CT ABDOMEN AND PELVIS WITH CONTRAST TECHNIQUE: Multidetector CT imaging of the abdomen and pelvis was performed using the standard protocol following bolus administration of intravenous contrast. CONTRAST:  121mL OMNIPAQUE IOHEXOL 300 MG/ML  SOLN COMPARISON:  12/21/2020 FINDINGS: Lower chest: Multiple circumscribed air cysts in the lungs. Bronchial wall thickening. Small pericardial effusion. Hepatobiliary: Cholelithiasis with stone in the gallbladder neck. No inflammatory changes to suggest acute cholecystitis. No focal liver lesions. No significant bile duct dilatation. Pancreas: Unremarkable. No pancreatic ductal dilatation or surrounding inflammatory changes. Spleen: Normal in size without focal abnormality. Adrenals/Urinary Tract: Adrenal glands are unremarkable. Kidneys are normal, without renal calculi, focal lesion, or hydronephrosis. Bladder is unremarkable. Stomach/Bowel: The stomach, small bowel, and colon are not abnormally distended. Large diverticula in the duodenum. Diverticulosis throughout the colon. No focal inflammatory changes to suggest acute diverticulitis. Appendix is not identified. Vascular/Lymphatic: No significant vascular findings are present. No enlarged abdominal or pelvic lymph nodes. Reproductive:  Diffusely enlarged and nodular uterus with calcification consistent with multiple uterine fibroids. No abnormal adnexal masses. Other: No abdominal wall hernia or abnormality. No abdominopelvic ascites. Musculoskeletal: Degenerative changes in the spine. No destructive bone lesions. IMPRESSION: 1. Severe diverticulosis throughout the colon. No specific inflammatory changes to suggest acute diverticulitis. Duodenal diverticula. 2. Cholelithiasis without evidence of cholecystitis. 3. Multiple uterine fibroids. 4. Circumscribed air cysts in the lung bases. 5. Small pericardial effusion. Electronically Signed   By: Lucienne Capers M.D.   On: 01/17/2021 02:06    ____________________________________________   PROCEDURES   Procedure(s) performed (including Critical Care):  Procedures   ____________________________________________   INITIAL IMPRESSION / MDM / [place] / ED COURSE  As part of my medical decision making, I reviewed the following data within the [place] History obtained from family, Nursing notes reviewed and incorporated, Labs  reviewed , EKG interpreted , Old chart reviewed, and Notes from prior ED visits   Differential diagnosis includes, but is not limited to, diverticulitis, urinary tract infection/pyonephritis, biliary colic, atypical ACS, SBO/ileus.  Patient's symptoms are somewhat vague but may represent diverticulitis based on her history and similar feelings.  Vital signs are stable and within normal limits.  She has unfortunately been in the emergency department for 8 hours due to overwhelming hospital and ED patient volume and she remained stable and at this point asymptomatic.  CMP, CBC, and lipase are all within normal limits.  Urinalysis has not yet been obtained.  I ordered an add-on troponin given that she is may be reporting some chest pain and I also ordered an EKG, but I have a very low suspicion for ACS.  I am ordering a CT scan with  IV contrast to further evaluate the possibility of intra-abdominal abnormalities.  Patient and her sister understand and agree with the plan.     Clinical Course as of 01/17/21 0330  Sun Jan 17, 2021  0305 CT ABDOMEN PELVIS W CONTRAST Unremarkable CT scan with no evidence of acute infection or SBO/ileus.  She has cholelithiasis but without evidence of cholecystitis and she has no LFT elevation, no evidence of obstruction, and no tenderness to palpation of the epigastrium nor right upper quadrant.  The patient has been resting comfortably and they have been in the department for more than 10 hours at this point.  I updated the patient and her sister about the reassuring results including a normal troponin and reassuring EKG and they are both fine with the plan for discharge and outpatient follow-up.  I gave my usual and customary return precautions. [CF]  0307 Of note, the patient has not yet given a urine specimen, but she is not having any dysuria and she and her sister are ready to go.  I encouraged follow-up as an outpatient. [CF]    Clinical Course User Index [CF] Hinda Kehr, MD     ____________________________________________  FINAL CLINICAL IMPRESSION(S) / ED DIAGNOSES  Final diagnoses:  Generalized abdominal pain  Nausea     MEDICATIONS GIVEN DURING THIS VISIT:  Medications  iohexol (OMNIPAQUE) 300 MG/ML solution 100 mL (100 mLs Intravenous Contrast Given 01/17/21 0139)     ED Discharge Orders          Ordered    ondansetron (ZOFRAN ODT) 4 MG disintegrating tablet        01/17/21 0308             Note:  This document was prepared using Dragon voice recognition software and may include unintentional dictation errors.   Hinda Kehr, MD 01/17/21 0330

## 2021-01-17 NOTE — Discharge Instructions (Addendum)

## 2021-01-17 NOTE — ED Notes (Addendum)
Patient states she came in for abdominal pain that started yesterday. Pt states it has been a day since she has had her last BM and also states she is sad because her mother just passed away. Pt also states her leg is swelling to the left.

## 2021-01-19 ENCOUNTER — Other Ambulatory Visit: Payer: Self-pay | Admitting: Gastroenterology

## 2021-01-19 DIAGNOSIS — R1084 Generalized abdominal pain: Secondary | ICD-10-CM

## 2021-02-05 ENCOUNTER — Other Ambulatory Visit: Payer: Self-pay

## 2021-02-05 ENCOUNTER — Emergency Department: Payer: Medicare Other

## 2021-02-05 ENCOUNTER — Emergency Department
Admission: EM | Admit: 2021-02-05 | Discharge: 2021-02-05 | Disposition: A | Discharge status: Home / Self Care | Payer: Medicare Other | Attending: Emergency Medicine | Admitting: Emergency Medicine

## 2021-02-05 DIAGNOSIS — Z743 Need for continuous supervision: Secondary | ICD-10-CM | POA: Diagnosis not present

## 2021-02-05 DIAGNOSIS — Z20822 Contact with and (suspected) exposure to covid-19: Secondary | ICD-10-CM | POA: Diagnosis not present

## 2021-02-05 DIAGNOSIS — R6889 Other general symptoms and signs: Secondary | ICD-10-CM | POA: Diagnosis not present

## 2021-02-05 DIAGNOSIS — R1084 Generalized abdominal pain: Secondary | ICD-10-CM | POA: Diagnosis not present

## 2021-02-05 DIAGNOSIS — I10 Essential (primary) hypertension: Secondary | ICD-10-CM | POA: Diagnosis not present

## 2021-02-05 DIAGNOSIS — K5790 Diverticulosis of intestine, part unspecified, without perforation or abscess without bleeding: Secondary | ICD-10-CM

## 2021-02-05 DIAGNOSIS — K219 Gastro-esophageal reflux disease without esophagitis: Secondary | ICD-10-CM | POA: Insufficient documentation

## 2021-02-05 DIAGNOSIS — R109 Unspecified abdominal pain: Secondary | ICD-10-CM | POA: Diagnosis not present

## 2021-02-05 LAB — COMPREHENSIVE METABOLIC PANEL
ALT: 11 U/L (ref 0–44)
AST: 14 U/L — ABNORMAL LOW (ref 15–41)
Albumin: 3.9 g/dL (ref 3.5–5.0)
Alkaline Phosphatase: 76 U/L (ref 38–126)
Anion gap: 6 (ref 5–15)
BUN: 21 mg/dL (ref 8–23)
CO2: 31 mmol/L (ref 22–32)
Calcium: 9.7 mg/dL (ref 8.9–10.3)
Chloride: 102 mmol/L (ref 98–111)
Creatinine, Ser: 0.88 mg/dL (ref 0.44–1.00)
GFR, Estimated: >60 mL/min (ref >60)
Glucose, Bld: 124 mg/dL — ABNORMAL HIGH (ref 70–99)
Potassium: 3.8 mmol/L (ref 3.5–5.1)
Sodium: 139 mmol/L (ref 135–145)
Total Bilirubin: 0.4 mg/dL (ref 0.3–1.2)
Total Protein: 7.8 g/dL (ref 6.5–8.1)

## 2021-02-05 LAB — CBC WITH DIFFERENTIAL/PLATELET
Abs Immature Granulocytes: 0.03 10*3/uL (ref 0.00–0.07)
Basophils Absolute: 0 10*3/uL (ref 0.0–0.1)
Basophils Relative: 0 %
Eosinophils Absolute: 0.5 10*3/uL (ref 0.0–0.5)
Eosinophils Relative: 8 %
HCT: 40.6 % (ref 36.0–46.0)
Hemoglobin: 12.2 g/dL (ref 12.0–15.0)
Immature Granulocytes: 0 %
Lymphocytes Relative: 19 %
Lymphs Abs: 1.3 10*3/uL (ref 0.7–4.0)
MCH: 27.6 pg (ref 26.0–34.0)
MCHC: 30 g/dL (ref 30.0–36.0)
MCV: 91.9 fL (ref 80.0–100.0)
Monocytes Absolute: 0.7 10*3/uL (ref 0.1–1.0)
Monocytes Relative: 10 %
Neutro Abs: 4.2 10*3/uL (ref 1.7–7.7)
Neutrophils Relative %: 63 %
Platelets: 192 10*3/uL (ref 150–400)
RBC: 4.42 MIL/uL (ref 3.87–5.11)
RDW: 16.2 % — ABNORMAL HIGH (ref 11.5–15.5)
WBC: 6.8 10*3/uL (ref 4.0–10.5)
nRBC: 0 % (ref 0.0–0.2)

## 2021-02-05 LAB — URINALYSIS, COMPLETE (UACMP) WITH MICROSCOPIC
Bacteria, UA: NONE SEEN
Bilirubin Urine: NEGATIVE
Glucose, UA: NEGATIVE mg/dL
Hgb urine dipstick: NEGATIVE
Ketones, ur: NEGATIVE mg/dL
Leukocytes,Ua: NEGATIVE
Nitrite: NEGATIVE
Protein, ur: NEGATIVE mg/dL
Specific Gravity, Urine: 1.01 (ref 1.005–1.030)
Squamous Epithelial / HPF: NONE SEEN (ref 0–5)
pH: 6 (ref 5.0–8.0)

## 2021-02-05 LAB — RESP PANEL BY RT-PCR (FLU A&B, COVID) ARPGX2
Influenza A by PCR: NEGATIVE
Influenza B by PCR: NEGATIVE
SARS Coronavirus 2 by RT PCR: NEGATIVE

## 2021-02-05 LAB — LIPASE, BLOOD: Lipase: 16 U/L (ref 11–51)

## 2021-02-05 MED ORDER — ONDANSETRON 4 MG PO TBDP: 4.0000 mg | ORAL_TABLET | Freq: Four times a day (QID) | ORAL | 0 refills | Status: DC | PRN

## 2021-02-05 MED ORDER — DICYCLOMINE HCL 20 MG PO TABS: 20.0000 mg | ORAL_TABLET | Freq: Three times a day (TID) | ORAL | 0 refills | Status: DC | PRN

## 2021-02-05 MED ORDER — DICYCLOMINE HCL 10 MG PO CAPS
10.0000 mg | ORAL_CAPSULE | Freq: Once | ORAL | Status: AC
  Administered 2021-02-05: 10 mg via ORAL
  Filled 2021-02-05: qty 1

## 2021-02-05 MED ORDER — FENTANYL CITRATE (PF) 100 MCG/2ML IJ SOLN
50.0000 ug | Freq: Once | INTRAMUSCULAR | Status: AC
  Administered 2021-02-05: 50 ug via INTRAVENOUS
  Filled 2021-02-05: qty 2

## 2021-02-05 MED ORDER — ONDANSETRON HCL 4 MG/2ML IJ SOLN
4.0000 mg | Freq: Once | INTRAMUSCULAR | Status: AC
  Administered 2021-02-05: 4 mg via INTRAVENOUS
  Filled 2021-02-05: qty 2

## 2021-02-05 MED ORDER — SODIUM CHLORIDE 0.9 % IV SOLN: INTRAVENOUS | Status: DC

## 2021-02-05 MED ORDER — IOHEXOL 300 MG/ML  SOLN
100.0000 mL | Freq: Once | INTRAMUSCULAR | Status: AC | PRN
  Administered 2021-02-05: 100 mL via INTRAVENOUS

## 2021-02-05 NOTE — ED Provider Notes (Signed)
[facility] Emergency Department Provider Note  ____________________________________________   Event Date/Time   First MD Initiated Contact with Patient 02/05/21 0114     (approximate)  I have reviewed the triage vital signs and the nursing notes.   HISTORY  Chief Complaint Abdominal Pain    HPI Taylor Johnson is a 75 y.o. female with history of developmental delay, hypertension, diverticulosis who presents to the emergency department with complaints of central abdominal pain that started tonight after eating.  Patient is a very poor historian which appears to be her baseline.  She states that she "spit up" her pills tonight.  Denies bloody stools, rectal bleeding, melena.  No chest pain or shortness of breath.  No known fever.  States she lives with her niece.    niece Valarie Merino Jurgensen [phone])    Past Medical History:  Diagnosis Date   Abdominal pain 01/27/2017   Anxiety    Anxiety, generalized 05/11/2015   Cough 06/02/2016   Chronic - followed by Pulmonary   Cough 06/02/2016   Chronic - followed by Pulmonary   Depression    Developmental delay    Diverticulitis    Diverticulosis of colon 05/11/2015   Dysphagia 10/15/2015   Routine Ba Swallow normal; rec modified barium swallow    GERD (gastroesophageal reflux disease)    Hematochezia 11/07/2019   Hypertension    Hypoxia 09/12/2015   Leg swelling    Localized edema 04/15/2016   Microscopic hematuria 04/15/2016   Nausea and vomiting 01/29/2017   Orthostatic hypotension 03/16/2017   OSA on CPAP 11/18/2015   CPAP @ 18 cm H2O started 02/2016   Rectal bleeding 10/15/2018   Urge incontinence of urine 05/12/2015    Patient Active Problem List   Diagnosis Date Noted   CPAP use counseling 08/10/2020   Morbid obesity ([place]) 08/10/2020   Abdominal pain 11/07/2019   GERD (gastroesophageal reflux disease) 11/07/2019   Dizziness 02/04/2019   Benign essential HTN 10/23/2018   Functional  systolic murmur 0000000   Developmental delay disorder 02/06/2017   Calculus of gallbladder without cholecystitis without obstruction 01/20/2017   Localized edema 04/15/2016   Microscopic hematuria 04/15/2016   OSA on CPAP 11/18/2015   Urge incontinence of urine 05/12/2015   Diverticulosis of colon 05/11/2015   Anxiety, generalized 05/11/2015    Past Surgical History:  Procedure Laterality Date   ABDOMINAL HYSTERECTOMY     BREAST BIOPSY Left    neg   COLON SURGERY     COLONOSCOPY WITH PROPOFOL N/A 12/13/2019   Procedure: COLONOSCOPY WITH PROPOFOL;  Surgeon: Lin Landsman, MD;  Location: [facility] ENDOSCOPY;  Service: Endoscopy;  Laterality: N/A;   ESOPHAGOGASTRODUODENOSCOPY (EGD) WITH PROPOFOL N/A 01/29/2017   Procedure: ESOPHAGOGASTRODUODENOSCOPY (EGD) WITH PROPOFOL;  Surgeon: Wilford Corner, MD;  Location: [facility] ENDOSCOPY;  Service: Endoscopy;  Laterality: N/A;    Prior to Admission medications   Medication Sig Start Date End Date Taking? Authorizing Provider  dicyclomine (BENTYL) 20 MG tablet Take 1 tablet (20 mg total) by mouth every 8 (eight) hours as needed for spasms (Abdominal cramping). 02/05/21  Yes Terica Yogi, Cyril Mourning N, DO  ondansetron (ZOFRAN ODT) 4 MG disintegrating tablet Take 1 tablet (4 mg total) by mouth every 6 (six) hours as needed for nausea or vomiting. 02/05/21  Yes Deasia Chiu, Cyril Mourning N, DO  hydrALAZINE (APRESOLINE) 25 MG tablet TAKE ONE TABLET BY MOUTH THREE TIMES DAILY 12/18/20   [name], MD  meclizine (ANTIVERT) 12.5 MG tablet TAKE ONE TABLET BY MOUTH  THREE TIMES DAILY AS NEEDED FOR DIZZINESS 12/18/20   [name], MD  metoCLOPramide (REGLAN) 5 MG tablet TAKE ONE TABLET BY MOUTH EVERY MORNING, NOON, EVENING 12/18/20   [name], MD    Allergies Patient has no known allergies.  Family History  Problem Relation Age of Onset   Hypertension Mother    Kidney disease Mother    Dementia Mother    Cancer Father        lung   Bladder Cancer Neg Hx     Breast cancer Neg Hx     Social History Social History   Tobacco Use   Smoking status: Never   Smokeless tobacco: Never   Tobacco comments:    smoking cessation materials not required  Vaping Use   Vaping Use: Never used  Substance Use Topics   Alcohol use: No    Alcohol/week: 0.0 standard drinks   Drug use: No    Review of Systems Level 5 caveat as patient is a very poor historian ____________________________________________   PHYSICAL EXAM:  VITAL SIGNS: ED Triage Vitals  Enc Vitals Group     BP 02/05/21 0120 (!) 201/94     Pulse Rate 02/05/21 0120 83     Resp 02/05/21 0120 20     Temp 02/05/21 0120 99.1 F (37.3 C)     Temp Source 02/05/21 0120 Oral     SpO2 02/05/21 0120 98 %     Weight 02/05/21 0123 264 lb 8.8 oz (120 kg)     Height --      Head Circumference --      Peak Flow --      Pain Score 02/05/21 0122 5     Pain Loc --      Pain Edu? --      Excl. in [place]? --    CONSTITUTIONAL: Alert and oriented x 3 but is a very poor historian and slow to answer questions.  Tearful, obese, elderly HEAD: Normocephalic EYES: Conjunctivae clear, pupils appear equal, EOM appear intact ENT: normal nose; moist mucous membranes NECK: Supple, normal ROM CARD: RRR; S1 and S2 appreciated; no murmurs, no clicks, no rubs, no gallops RESP: Normal chest excursion without splinting or tachypnea; breath sounds clear and equal bilaterally; no wheezes, no rhonchi, no rales, no hypoxia or respiratory distress, speaking full sentences ABD/GI: Normal bowel sounds; non-distended; soft, tender to palpation in the mid abdomen and left lower quadrant without guarding or rebound; NO bloody stool or melena, guaiac negative BACK: The back appears normal EXT: Normal ROM in all joints; no deformity noted, no edema; no cyanosis SKIN: Normal color for age and race; warm; no rash on exposed skin NEURO: Moves all extremities equally PSYCH: The patient's mood and manner are  appropriate.  ____________________________________________   LABS (all labs ordered are listed, but only abnormal results are displayed)  Labs Reviewed  CBC WITH DIFFERENTIAL/PLATELET - Abnormal; Notable for the following components:      Result Value   RDW 16.2 (*)    All other components within normal limits  COMPREHENSIVE METABOLIC PANEL - Abnormal; Notable for the following components:   Glucose, Bld 124 (*)    AST 14 (*)    All other components within normal limits  URINALYSIS, COMPLETE (UACMP) WITH MICROSCOPIC - Abnormal; Notable for the following components:   Color, Urine STRAW (*)    APPearance CLEAR (*)    All other components within normal limits  RESP PANEL BY RT-PCR (FLU A&B, COVID)  ARPGX2  LIPASE, BLOOD   ____________________________________________  EKG   ____________________________________________  RADIOLOGY I, Jamal Haskin, personally viewed and evaluated these images (plain radiographs) as part of my medical decision making, as well as reviewing the written report by the radiologist.  ED MD interpretation: Diverticulosis without diverticulitis.  Cholelithiasis without cholecystitis.  Official radiology report(s): CT ABDOMEN PELVIS W CONTRAST  Result Date: 02/05/2021 CLINICAL DATA:  Acute nonlocalized abdominal pain EXAM: CT ABDOMEN AND PELVIS WITH CONTRAST TECHNIQUE: Multidetector CT imaging of the abdomen and pelvis was performed using the standard protocol following bolus administration of intravenous contrast. CONTRAST:  130m OMNIPAQUE IOHEXOL 300 MG/ML  SOLN COMPARISON:  01/17/2021 FINDINGS: Lower chest: Multiple thin wall cysts within the visualized lung bases are stable, nonspecific. Small pericardial effusion is present unchanged from prior examination. Mild global cardiomegaly. Hepatobiliary: No focal liver abnormality is seen. The gallbladder is unremarkable. Motion artifact obscures known cholelithiasis better appreciated on prior examinations.  Pancreas: Unremarkable Spleen: Unremarkable Adrenals/Urinary Tract: Adrenal glands are unremarkable. Kidneys are normal, without renal calculi, focal lesion, or hydronephrosis. Bladder is unremarkable. Stomach/Bowel: Severe pancolonic diverticulosis is again identified without superimposed inflammatory change. Numerous debris Tinel diverticula are again noted. The stomach, small bowel, and large bowel are otherwise unremarkable. Appendix normal. No free intraperitoneal gas or fluid. Vascular/Lymphatic: No significant vascular findings are present. No enlarged abdominal or pelvic lymph nodes. Reproductive: Multiple calcified involuted fibroids are seen within the uterus. The pelvic organs are otherwise unremarkable. Other: No abdominal wall hernia.  Rectum unremarkable. Musculoskeletal: No acute bone abnormality. No lytic or blastic bone lesion. Degenerative changes are seen within the lumbar spine. IMPRESSION: No acute intra-abdominal pathology identified. No definite radiographic explanation for the patient's reported symptoms. Stable examination. Multiple thin wall cysts within the visualized lung bases. Nonspecific. Stable mild cardiomegaly and small pericardial effusion. Known cholelithiasis is not well appreciated on this examination. Severe pancolonic and duodenal diverticulosis without superimposed inflammatory change. Electronically Signed   By: AFidela SalisburyMD   On: 02/05/2021 04:42    ____________________________________________   PROCEDURES  Procedure(s) performed (including Critical Care):  Procedures    ____________________________________________   INITIAL IMPRESSION / ASSESSMENT AND PLAN / ED COURSE  As part of my medical decision making, I reviewed the following data [place] obtained from family, Nursing notes reviewed and incorporated, Labs reviewed , Old chart reviewed, Radiograph reviewed  and Notes from prior ED visits          Patient here with mid and left lower quadrant abdominal pain.  Here frequently for the same.  Has history of GI bleed, diverticulosis but denies that she is bleeding today.  History limited due to her history of developmental delay differential includes diverticulitis, colitis, bowel obstruction, appendicitis, cholecystitis, pancreatitis, UTI, kidney stone, pyelonephritis, gastritis, perforation, volvulus.  Will obtain labs, CT of the abdomen pelvis, urine.  Will give IV fluids, pain and nausea medicine.  Will talk to family by phone.  Patient also hypertensive today but this may be due to pain.  Will reassess after pain controlled.  ED PROGRESS   Spoke with Callie patient's sister who is at home with COVID now.  She was not aware that patient was coming to the emergency department.  She confirms that patient lives with her niece who she states would be at work right now.  She confirms that patient appears to be at her baseline.  She is not aware that the patient has any previous abdominal surgeries.  Discussed  plan of care with patient's sister who agrees.  I have also attempted to contact patient's niece but there was no answer.   Patient's work-up today is unremarkable.  Patient has normal electrolytes, renal function, LFTs, lipase.  Normal white blood cell count, hemoglobin.  Urine does not appear infected.  CT scan shows no acute abnormality.  Does have extensive diverticulosis without diverticulitis.  COVID and flu negative.  Patient has had no vomiting or diarrhea here.  I feel she is safe to be discharged home with her other sister who is at the bedside.  Will discharge with Bentyl, Zofran and recommended close PCP and GI follow-up given this appears to be at a recurring chronic issue for the patient.  They verbalized understanding.  At this time, I do not feel there is any life-threatening condition present. I have reviewed, interpreted and discussed all results (EKG, imaging, lab, urine as  appropriate) and exam findings with patient/family. I have reviewed nursing notes and appropriate previous records.  I feel the patient is safe to be discharged home without further emergent workup and can continue workup as an outpatient as needed. Discussed usual and customary return precautions. Patient/family verbalize understanding and are comfortable with this plan.  Outpatient follow-up has been provided as needed. All questions have been answered.  ____________________________________________   FINAL CLINICAL IMPRESSION(S) / ED DIAGNOSES  Final diagnoses:  Generalized abdominal pain  Diverticulosis     ED Discharge Orders          Ordered    dicyclomine (BENTYL) 20 MG tablet  Every 8 hours PRN        02/05/21 0452    ondansetron (ZOFRAN ODT) 4 MG disintegrating tablet  Every 6 hours PRN        02/05/21 0452            *Please note:  Modena Steedley Vida was evaluated in Emergency Department on 02/05/2021 for the symptoms described in the history of present illness. She was evaluated in the context of the global COVID-19 pandemic, which necessitated consideration that the patient might be at risk for infection with the SARS-CoV-2 virus that causes COVID-19. Institutional protocols and algorithms that pertain to the evaluation of patients at risk for COVID-19 are in a state of rapid change based on information released by regulatory bodies including the CDC and federal and state organizations. These policies and algorithms were followed during the patient's care in the ED.  Some ED evaluations and interventions may be delayed as a result of limited staffing during and the pandemic.*   Note:  This document was prepared using Dragon voice recognition software and may include unintentional dictation errors.    Rebekah Zackery, Delice Bison, DO 02/05/21 [phone]

## 2021-02-05 NOTE — ED Triage Notes (Signed)
Pt presents to ER via ems from home c/o abdominal pain since around 2000 yesterday.  Pt states she has hx of diverticulitis and this feels similar to flare ups she has had in the past.  Pt denies n/v/d, or pain on palpation to abdomen.  Pt A&O x3 at this time and began crying when asked what month it is d/t her mom dying in July some time ago.

## 2021-02-05 NOTE — Discharge Instructions (Addendum)
Your labs, urine and CT scan were reassuring today.  No acute emergent issue detected on your work-up.  I recommend close follow-up with your primary care physician if symptoms continue as well as establishing care with a gastroenterologist given you have been seen in the emergency department several times for similar complaints.  You may take over-the-counter Tylenol 1000 mg every 6 hours as needed for pain.  We are also discharging with a prescription for Bentyl that you may take for pain and Zofran that you may take for nausea or vomiting.

## 2021-02-10 ENCOUNTER — Other Ambulatory Visit: Payer: Self-pay | Admitting: Gastroenterology

## 2021-02-10 DIAGNOSIS — R1084 Generalized abdominal pain: Secondary | ICD-10-CM

## 2021-02-22 ENCOUNTER — Telehealth: Payer: Self-pay

## 2021-02-22 NOTE — Telephone Encounter (Signed)
Copied from [place] [id number]. Topic: Appointment Scheduling - Scheduling Inquiry for Clinic >> Feb 22, 2021  1:00 PM Greggory Keen D wrote: Pt's sister called saying Yeimy has had loose stools and wants to see Dr. Army Melia but she is out of the office this week,  CB#   [place]

## 2021-02-22 NOTE — Telephone Encounter (Signed)
Called [name] let her know that pt would need to see GI for loose stools. She verbalized understanding.  KP

## 2021-02-23 DIAGNOSIS — G4733 Obstructive sleep apnea (adult) (pediatric): Secondary | ICD-10-CM | POA: Diagnosis not present

## 2021-03-10 ENCOUNTER — Ambulatory Visit (INDEPENDENT_AMBULATORY_CARE_PROVIDER_SITE_OTHER): Payer: Medicare Other | Admitting: Gastroenterology

## 2021-03-10 ENCOUNTER — Encounter: Payer: Self-pay | Admitting: Gastroenterology

## 2021-03-10 ENCOUNTER — Other Ambulatory Visit: Payer: Self-pay

## 2021-03-10 VITALS — BP 147/73 | HR 70 | Temp 97.6°F | Ht 64.0 in | Wt 256.4 lb

## 2021-03-10 DIAGNOSIS — R195 Other fecal abnormalities: Secondary | ICD-10-CM | POA: Diagnosis not present

## 2021-03-10 NOTE — Patient Instructions (Signed)
Gave Creon samples. Take 2 capsules with the first bite of each meal and 1 capsule with the first bite of each snack.

## 2021-03-10 NOTE — Progress Notes (Signed)
[name], MD [address]  [place]  [place], [place] [address]  Main: [phone]  Fax: [phone]    Gastroenterology Consultation  Referring Provider:     [name], MD Primary Care Physician:  [name], MD Primary Gastroenterologist:  Dr. [name] Reason for Consultation: Loose stools        HPI:   ZAELEE BIDDIX is a 75 y.o. y/o female referred by Dr. Army Melia, Jesse Sans, MD  for consultation & management of Abdominal pain and difficulty swallowing. She has known history of severe pandiverticulosis and was recently admitted to [facility] about a month ago with recurrent nausea and vomiting, abdominal pain, CT angiogram of the abdomen did not reveal any bleeding source. There was no evidence of diverticulitis. She was seen by GI, underwent EGD and it was unremarkable. She had a normal barium swallow in March 2017. She was discharged to rehabilitation and subsequently home now for. Per her sister, she has lost about 30 pounds in last 2 months due to nausea, vomiting, abdominal pain and difficulty swallowing solid foods. She is also deconditioned since her discharge and is currently undergoing PT, patient thinks her energy levels are coming back up slowly. She is currently eating only mashed foods. Her sister reports that she has been coughing when she tries to eat solid food, and she feels like food is stuck in her throat. She denies upper abdominal pain, heartburn. She is not having episodes of nausea and vomiting anymore.  With regards to her abdominal pain, patient reports that it is worse at night. She is having one to 2 soft bowel movements daily. She has severe pandiverticulosis based on an endoscopy and abdominal imaging. Denies any blood in the stool. Per her sister, she had several episodes of diverticulitis in the past as well as diverticular bleeding needing blood transfusions.  Follow-up phone visit 12/03/2018 Patient  has been doing well.  Patient's sister provided the history.  Patient lives with her sister.  She does not have any concerns today.  Work-up of anemia revealed improving hemoglobin and normal B12, folate and iron panel.  H. pylori breath test returned positive.  She is taking omeprazole 40 mg once a day at present.  She denies any difficulty swallowing.  She denies any other GI symptoms at this time.  Follow-up phone visit 12/24/2018 Ms. Inglis had episode of rectal bleeding last week and her sister was very concerned about it.  She described it as bright red blood per rectum.  Repeat CBC revealed normal hemoglobin.  Patient's sister reported that rectal bleeding subsided and she does not have any abdominal symptoms.  Patient is about to finish triple therapy for H. pylori infection  Follow-up visit 08/05/2019 Ms. Ibrahim went to [facility] ER on 07/25/2019 secondary to abdominal pain that started after eating pork meat fat.  She underwent CT abdomen and pelvis which was unremarkable except for colonic diverticulosis.  CBC, lipase and CMP were normal.  Patient's daughter reports that she eats all day long, does eat fried foods every day.  Patient reports having bowel movements every day.  She describes the pain below umbilicus without any radiation.  Patient denies rectal bleeding, nausea vomiting, abdominal bloating  Follow-up visit 11/15/2019 Patient was recently admitted to [facility] secondary to generalized abdominal pain and hematochezia with no evidence of acute anemia.  Patient reports that she has been noticing blood in her depends.  She is not clear if  she has rectal bleeding or vaginal bleeding or blood in her urine.  She reports that her bowel movements have been regular.  She also reports generalized abdominal pain which has been chronic.  She was recommended to try Flexeril during recent admission.  She was on Bentyl in the past.  Her weight has been stable.  Follow-up visit 12/20/2019 Patient reports ongoing  mild rectal bleeding.  Patient is accompanied by her sister today.  She underwent colonoscopy which revealed polyps, diverticulosis and hemorrhoids.  Patient is here to discuss about hemorrhoid ligation.  Follow-up visit 03/10/2021 Patient is here to discuss about lower abdominal discomfort, bloating associated with loose stools.  Patient is accompanied by her sister today who is concerned that she eats all day and more than what she is supposed to be eating.  She has been taking MiraLAX as needed as prescribed in the ER.  Patient reports that greasy foods triggers her GI symptoms.   Her weight has been stable.  Labs were unremarkable.  Repeat imaging study did not reveal any acute intra-abdominal pathology  GI Procedures: EGD 01/2017 normal. Colonoscopy 04/2014 showed diverticulosis. Complete report not available and patient reports that it was done at [place]  Colonoscopy 12/13/2019 - Hemorrhoids found on perianal exam. - One 10 mm polyp in the ascending colon, removed with a hot snare. Resected and retrieved. - Three 3 to 6 mm polyps in the descending colon and in the ascending colon, removed with a cold snare. Resected and retrieved. - Diverticulosis in the entire examined colon. - Non-bleeding internal hemorrhoids.  DIAGNOSIS:  A. COLON POLYP X3, ASCENDING; BIOPSY:  - 2 FRAGMENTS OF TUBULAR ADENOMA AND A FRAGMENT OF BENIGN COLONIC  MUCOSA.  - NEGATIVE FOR HIGH-GRADE DYSPLASIA AND MALIGNANCY.   B. COLON POLYP, DESCENDING; BIOPSY:  - FRAGMENTS OF TUBULAR ADENOMA.  - NEGATIVE FOR HIGH-GRADE DYSPLASIA AND MALIGNANCY.     Past Medical History:  Diagnosis Date   Abdominal pain 01/27/2017   Anxiety    Anxiety, generalized 05/11/2015   Cough 06/02/2016   Chronic - followed by Pulmonary   Cough 06/02/2016   Chronic - followed by Pulmonary   Depression    Developmental delay    Diverticulitis    Diverticulosis of colon 05/11/2015   Dysphagia 10/15/2015   Routine Ba  Swallow normal; rec modified barium swallow    GERD (gastroesophageal reflux disease)    Hematochezia 11/07/2019   Hypertension    Hypoxia 09/12/2015   Leg swelling    Localized edema 04/15/2016   Microscopic hematuria 04/15/2016   Nausea and vomiting 01/29/2017   Orthostatic hypotension 03/16/2017   OSA on CPAP 11/18/2015   CPAP @ 18 cm H2O started 02/2016   Rectal bleeding 10/15/2018   Urge incontinence of urine 05/12/2015    Past Surgical History:  Procedure Laterality Date   ABDOMINAL HYSTERECTOMY     BREAST BIOPSY Left    neg   COLON SURGERY     COLONOSCOPY WITH PROPOFOL N/A 12/13/2019   Procedure: COLONOSCOPY WITH PROPOFOL;  Surgeon: Lin Landsman, MD;  Location: [facility];  Service: Endoscopy;  Laterality: N/A;   ESOPHAGOGASTRODUODENOSCOPY (EGD) WITH PROPOFOL N/A 01/29/2017   Procedure: ESOPHAGOGASTRODUODENOSCOPY (EGD) WITH PROPOFOL;  Surgeon: Wilford Corner, MD;  Location: [facility];  Service: Endoscopy;  Laterality: N/A;    Current Outpatient Medications:    dicyclomine (BENTYL) 10 MG capsule, TAKE TWO CAPSULES BY MOUTH FOUR TIMES DAILY, Disp: 224 capsule, Rfl: 0   dicyclomine (BENTYL) 20 MG tablet,  Take 1 tablet (20 mg total) by mouth every 8 (eight) hours as needed for spasms (Abdominal cramping)., Disp: 15 tablet, Rfl: 0   hydrALAZINE (APRESOLINE) 25 MG tablet, TAKE ONE TABLET BY MOUTH THREE TIMES DAILY, Disp: 84 tablet, Rfl: 2   meclizine (ANTIVERT) 12.5 MG tablet, TAKE ONE TABLET BY MOUTH THREE TIMES DAILY AS NEEDED FOR DIZZINESS, Disp: 30 tablet, Rfl: 5   metoCLOPramide (REGLAN) 5 MG tablet, TAKE ONE TABLET BY MOUTH EVERY MORNING, NOON, EVENING, Disp: 84 tablet, Rfl: 5   ondansetron (ZOFRAN ODT) 4 MG disintegrating tablet, Take 1 tablet (4 mg total) by mouth every 6 (six) hours as needed for nausea or vomiting., Disp: 20 tablet, Rfl: 0   Family History  Problem Relation Age of Onset   Hypertension Mother    Kidney disease Mother    Dementia  Mother    Cancer Father        lung   Bladder Cancer Neg Hx    Breast cancer Neg Hx      Social History   Tobacco Use   Smoking status: Never   Smokeless tobacco: Never   Tobacco comments:    smoking cessation materials not required  Vaping Use   Vaping Use: Never used  Substance Use Topics   Alcohol use: No    Alcohol/week: 0.0 standard drinks   Drug use: No    Allergies as of 03/10/2021   (No Known Allergies)    Review of Systems:    All systems reviewed and negative except where noted in HPI.   Physical Exam:  BP (!) 147/73 (BP Location: Left Arm, Patient Position: Sitting, Cuff Size: Normal)   Pulse 70   Temp 97.6 F (36.4 C) (Oral)   Ht '5\' 4"'$  (1.626 m)   Wt 256 lb 6 oz (116.3 kg)   BMI 44.01 kg/m  No LMP recorded. Patient has had a hysterectomy.  General:   Alert,  Sitting in wheelchair, Well-developed, well-nourished, pleasant and cooperative in NAD Head:  Normocephalic and atraumatic. Eyes:  Sclera clear, no icterus.   Conjunctiva pink. Ears:  Normal auditory acuity. Lungs:  Respirations even and unlabored.  Clear throughout to auscultation.   No wheezes, crackles, or rhonchi. No acute distress. Heart:  Regular rate and rhythm; no murmurs, clicks, rubs, or gallops. Abdomen:  Normal bowel sounds. Obese Soft, non-tender and non-distended without masses, no appreciable hepatosplenomegaly or hernias noted.  No guarding or rebound tenderness.   Rectal: Nor performed Msk:  Symmetrical without gross deformities. Good, equal movement & strength bilaterally. Pulses:  Normal pulses noted. Extremities:  No clubbing or edema.  No cyanosis. Neurologic:  Alert and oriented x3; Skin:  Intact without significant lesions or rashes. No jaundice. Psych:  Alert and cooperative. Normal mood and affect.  Imaging Studies: Reviewed  Assessment and Plan:   ILLIANNA BERNACKI is a 75 y.o. female with morbid obesity, abdominal pain most likely secondary to diverticulosis and she  may have segmental colitis associated with diverticulosis causing abdominal pain.  Patient had a colonoscopy 2015 which revealed diverticulosis only.  CT abdomen pelvis was unremarkable.  Patient is evaluated by OB/GYN, pelvic ultrasound revealed uterine fibroids.  Colonoscopy revealed diverticulosis and internal hemorrhoids Patient underwent hemorrhoid ligation x 2 for symptomatic internal hemorrhoids. Patient's sister who is power of attorney consented to proceed with hemorrhoid banding.  For loose stools and abdominal bloating, recommend to check pancreatic fecal elastase levels to rule out EPI Trial of pancreatic enzymes, samples provided  Follow up  in 3 months   [name], MD

## 2021-03-16 ENCOUNTER — Other Ambulatory Visit: Payer: Self-pay | Admitting: Internal Medicine

## 2021-03-16 ENCOUNTER — Other Ambulatory Visit: Payer: Self-pay | Admitting: Gastroenterology

## 2021-03-16 DIAGNOSIS — R1084 Generalized abdominal pain: Secondary | ICD-10-CM

## 2021-03-16 DIAGNOSIS — R030 Elevated blood-pressure reading, without diagnosis of hypertension: Secondary | ICD-10-CM

## 2021-03-16 DIAGNOSIS — R195 Other fecal abnormalities: Secondary | ICD-10-CM | POA: Diagnosis not present

## 2021-03-17 NOTE — Telephone Encounter (Signed)
Patient will need office visit for further refills. Requested Prescriptions  Pending Prescriptions Disp Refills  . hydrALAZINE (APRESOLINE) 25 MG tablet [Pharmacy Med Name: hydralazine 25 mg tablet] 84 tablet 2    Sig: TAKE ONE TABLET BY MOUTH THREE TIMES DAILY     Cardiovascular:  Vasodilators Failed - 03/16/2021 10:28 AM      Failed - Last BP in normal range    BP Readings from Last 1 Encounters:  03/10/21 (!) 147/73         Passed - HCT in normal range and within 360 days    HCT  Date Value Ref Range Status  02/05/2021 40.6 36.0 - 46.0 % Final   Hematocrit  Date Value Ref Range Status  06/04/2020 40.3 34.0 - 46.6 % Final         Passed - HGB in normal range and within 360 days    Hemoglobin  Date Value Ref Range Status  02/05/2021 12.2 12.0 - 15.0 g/dL Final  06/04/2020 12.8 11.1 - 15.9 g/dL Final         Passed - RBC in normal range and within 360 days    RBC  Date Value Ref Range Status  02/05/2021 4.42 3.87 - 5.11 MIL/uL Final         Passed - WBC in normal range and within 360 days    WBC  Date Value Ref Range Status  02/05/2021 6.8 4.0 - 10.5 K/uL Final         Passed - PLT in normal range and within 360 days    Platelets  Date Value Ref Range Status  02/05/2021 192 150 - 400 K/uL Final  06/04/2020 216 150 - 450 x10E3/uL Final         Passed - Valid encounter within last 12 months    Recent Outpatient Visits          9 months ago Benign essential HTN   [facility] [name], MD   1 year ago Benign essential HTN   [facility] [name], MD   2 years ago Benign essential HTN   [facility] [name], MD   2 years ago Gastritis without bleeding, unspecified chronicity, unspecified gastritis type   [facility] [name], MD   2 years ago Rectal bleeding   [facility] [name], MD      Future Appointments            In 3 months [place], Tally Due, MD [place]

## 2021-03-18 LAB — PANCREATIC ELASTASE, FECAL: Pancreatic Elastase, Fecal: 65 ug Elast./g — ABNORMAL LOW (ref >200)

## 2021-03-23 ENCOUNTER — Telehealth: Payer: Self-pay

## 2021-03-23 MED ORDER — PANCRELIPASE (LIP-PROT-AMYL) 36000-114000 UNITS PO CPEP: ORAL_CAPSULE | ORAL | 2 refills | Status: DC

## 2021-03-23 NOTE — Telephone Encounter (Signed)
-----   Message from Lin Landsman, MD sent at 03/20/2021 11:37 AM EDT ----- Pancreatic fecal elastase levels confirm that patient has severe pancreatic insufficiency which explains her abdominal bloating, diarrhea and discomfort.  We gave her pancreatic enzyme samples.  Please send her prescription for Creon or Zenpep 2 to 3 capsules with each meal and 1-2 with snack.  I strongly recommend that she cuts back on snacks, junk foods, fried foods, fatty foods, sugary drinks  I will see her for follow-up as scheduled to discuss further recommendations on her diet with her sister  [name]

## 2021-03-23 NOTE — Telephone Encounter (Signed)
Patient sister carol verbalized understanding of results. She was on patient DPR form. She states she will pick up the creon at the pharmacy in the morning

## 2021-03-29 DIAGNOSIS — H16143 Punctate keratitis, bilateral: Secondary | ICD-10-CM | POA: Diagnosis not present

## 2021-05-06 ENCOUNTER — Other Ambulatory Visit: Payer: Self-pay | Admitting: Gastroenterology

## 2021-05-06 ENCOUNTER — Encounter: Payer: Self-pay | Admitting: General Surgery

## 2021-05-06 ENCOUNTER — Other Ambulatory Visit: Payer: Self-pay

## 2021-05-06 DIAGNOSIS — R1084 Generalized abdominal pain: Secondary | ICD-10-CM

## 2021-05-06 MED ORDER — DICYCLOMINE HCL 10 MG PO CAPS: ORAL_CAPSULE | ORAL | 2 refills | Status: DC

## 2021-05-06 NOTE — Progress Notes (Signed)
Sent in refill for bentyl 10mg   last office visit was 03/10/2021

## 2021-05-26 ENCOUNTER — Other Ambulatory Visit: Payer: Self-pay

## 2021-05-26 ENCOUNTER — Ambulatory Visit (INDEPENDENT_AMBULATORY_CARE_PROVIDER_SITE_OTHER): Payer: Medicare Other

## 2021-05-26 VITALS — BP 130/80 | HR 64 | Temp 98.1°F | Resp 16 | Ht 64.0 in | Wt 250.6 lb

## 2021-05-26 DIAGNOSIS — G4733 Obstructive sleep apnea (adult) (pediatric): Secondary | ICD-10-CM | POA: Diagnosis not present

## 2021-05-26 DIAGNOSIS — Z0000 Encounter for general adult medical examination without abnormal findings: Secondary | ICD-10-CM

## 2021-05-26 DIAGNOSIS — Z1231 Encounter for screening mammogram for malignant neoplasm of breast: Secondary | ICD-10-CM

## 2021-05-26 NOTE — Progress Notes (Signed)
Subjective:   Taylor Johnson is a 75 y.o. female who presents for Medicare Annual (Subsequent) preventive examination.  Review of Systems     Cardiac Risk Factors include: advanced age (>31men, >15 women);hypertension;sedentary lifestyle;obesity (BMI >30kg/m2)     Objective:    Today's Vitals   05/26/21 1445  BP: 130/80  Pulse: 64  Resp: 16  Temp: 98.1 F (36.7 C)  TempSrc: Oral  SpO2: 96%  Weight: 250 lb 9.6 oz (113.7 kg)  Height: 5\' 4"  (1.626 m)   Body mass index is 43.02 kg/m.  Advanced Directives 05/26/2021 02/05/2021 12/21/2020 12/09/2020 05/25/2020 12/13/2019 11/07/2019  Does Patient Have a Medical Advance Directive? Yes No No No Yes No No  Type of Paramedic of [place];Living will - - - [place];Living will - -  Copy of [place] in Chart? Yes - validated most recent copy scanned in chart (See row information) - - - Yes - validated most recent copy scanned in chart (See row information) - -  Would patient like information on creating a medical advance directive? - - - No - Patient declined - No - Patient declined No - Patient declined    Current Medications (verified) Outpatient Encounter Medications as of 05/26/2021  Medication Sig   dicyclomine (BENTYL) 20 MG tablet Take 1 tablet (20 mg total) by mouth every 8 (eight) hours as needed for spasms (Abdominal cramping).   hydrALAZINE (APRESOLINE) 25 MG tablet TAKE ONE TABLET BY MOUTH THREE TIMES DAILY   meclizine (ANTIVERT) 12.5 MG tablet TAKE ONE TABLET BY MOUTH THREE TIMES DAILY AS NEEDED FOR DIZZINESS   lipase/protease/amylase (CREON) 36000 UNITS CPEP capsule Take 2 capsules with the first bite of each meal and 1 capsule with the first bite of each snack (Patient not taking: Reported on 05/26/2021)   metoCLOPramide (REGLAN) 5 MG tablet TAKE ONE TABLET BY MOUTH EVERY MORNING, NOON, EVENING (Patient not taking: Reported on 05/26/2021)   [DISCONTINUED] dicyclomine  (BENTYL) 10 MG capsule TAKE TWO TABLETS BY MOUTH FOUR TIMES DAILY   [DISCONTINUED] dicyclomine (BENTYL) 10 MG capsule TAKE TWO TABLETS BY MOUTH FOUR TIMES DAILY   [DISCONTINUED] ondansetron (ZOFRAN ODT) 4 MG disintegrating tablet Take 1 tablet (4 mg total) by mouth every 6 (six) hours as needed for nausea or vomiting.   No facility-administered encounter medications on file as of 05/26/2021.    Allergies (verified) Patient has no known allergies.   History: Past Medical History:  Diagnosis Date   Abdominal pain 01/27/2017   Anxiety    Anxiety, generalized 05/11/2015   Cough 06/02/2016   Chronic - followed by Pulmonary   Cough 06/02/2016   Chronic - followed by Pulmonary   Depression    Developmental delay    Diverticulitis    Diverticulosis of colon 05/11/2015   Dysphagia 10/15/2015   Routine Ba Swallow normal; rec modified barium swallow    GERD (gastroesophageal reflux disease)    Hematochezia 11/07/2019   Hypertension    Hypoxia 09/12/2015   Leg swelling    Localized edema 04/15/2016   Microscopic hematuria 04/15/2016   Nausea and vomiting 01/29/2017   Orthostatic hypotension 03/16/2017   OSA on CPAP 11/18/2015   CPAP @ 18 cm H2O started 02/2016   Rectal bleeding 10/15/2018   Urge incontinence of urine 05/12/2015   Past Surgical History:  Procedure Laterality Date   ABDOMINAL HYSTERECTOMY     BREAST BIOPSY Left    neg   COLON SURGERY  COLONOSCOPY WITH PROPOFOL N/A 12/13/2019   Procedure: COLONOSCOPY WITH PROPOFOL;  Surgeon: Lin Landsman, MD;  Location: [facility] ENDOSCOPY;  Service: Endoscopy;  Laterality: N/A;   ESOPHAGOGASTRODUODENOSCOPY (EGD) WITH PROPOFOL N/A 01/29/2017   Procedure: ESOPHAGOGASTRODUODENOSCOPY (EGD) WITH PROPOFOL;  Surgeon: Wilford Corner, MD;  Location: [facility] ENDOSCOPY;  Service: Endoscopy;  Laterality: N/A;   Family History  Problem Relation Age of Onset   Hypertension Mother    Kidney disease Mother    Dementia Mother    Cancer  Father        lung   Bladder Cancer Neg Hx    Breast cancer Neg Hx    Social History   Socioeconomic History   Marital status: Single    Spouse name: Not on file   Number of children: 0   Years of education: Not on file   Highest education level: 5th grade  Occupational History    Employer: retired  Tobacco Use   Smoking status: Never   Smokeless tobacco: Never   Tobacco comments:    smoking cessation materials not required  Vaping Use   Vaping Use: Never used  Substance and Sexual Activity   Alcohol use: No    Alcohol/week: 0.0 standard drinks   Drug use: No   Sexual activity: Never    Birth control/protection: Post-menopausal  Other Topics Concern   Not on file  Social History Narrative   Pt lives with her niece Alvie Heidelberg Hooser   Social Determinants of Health   Financial Resource Strain: Low Risk    Difficulty of Paying Living Expenses: Not very hard  Food Insecurity: No Food Insecurity   Worried About Charity fundraiser in the Last Year: Never true   Ran Out of Food in the Last Year: Never true  Transportation Needs: No Transportation Needs   Lack of Transportation (Medical): No   Lack of Transportation (Non-Medical): No  Physical Activity: Inactive   Days of Exercise per Week: 0 days   Minutes of Exercise per Session: 0 min  Stress: No Stress Concern Present   Feeling of Stress : Only a little  Social Connections: Socially Isolated   Frequency of Communication with Friends and Family: More than three times a week   Frequency of Social Gatherings with Friends and Family: More than three times a week   Attends Religious Services: Never   Marine scientist or Organizations: No   Attends Music therapist: Never   Marital Status: Never married    Tobacco Counseling Counseling given: Not Answered Tobacco comments: smoking cessation materials not required   Clinical Intake:  Pre-visit preparation completed: Yes  Pain : No/denies  pain     BMI - recorded: 43.02 Nutritional Status: BMI > 30  Obese Nutritional Risks: None Diabetes: No  How often do you need to have someone help you when you read instructions, pamphlets, or other written materials from your doctor or pharmacy?: 1 - Never    Interpreter Needed?: No  Information entered by :: Clemetine Marker LPN   Activities of Daily Living In your present state of health, do you have any difficulty performing the following activities: 05/26/2021  Hearing? N  Vision? N  Difficulty concentrating or making decisions? N  Walking or climbing stairs? Y  Dressing or bathing? N  Doing errands, shopping? Y  Preparing Food and eating ? N  Using the Toilet? N  In the past six months, have you accidently leaked urine? Y  Comment wears depends  Do you have problems with loss of bowel control? N  Managing your Medications? N  Managing your Finances? Y  Housekeeping or managing your Housekeeping? N  Some recent data might be hidden    Patient Care Team: [name], MD as PCP - General (Internal Medicine) Lin Landsman, MD as Consulting Physician (Gastroenterology)  Indicate any recent Medical Services you may have received from other than [facility] providers in the past year (date may be approximate).     Assessment:   This is a routine wellness examination for [facility].  Hearing/Vision screen Hearing Screening - Comments::  Pt denies hearing difficulty  Vision Screening - Comments:: Annual vision screenings done at [facility] issues and exercise activities discussed: Current Exercise Habits: The patient does not participate in regular exercise at present, Exercise limited by: orthopedic condition(s)   Goals Addressed             This Visit's Progress    DIET - INCREASE WATER INTAKE   On track    Recommend drinking 6-8 glasses of water per day        Depression Screen PHQ 2/9 Scores 05/26/2021 06/04/2020 05/25/2020 09/18/2019  05/20/2019 01/21/2019 10/23/2018  PHQ - 2 Score 0 2 3 2  0 0 0  PHQ- 9 Score 4 2 3 6  - - -    Fall Risk Fall Risk  05/26/2021 06/04/2020 05/25/2020 05/20/2019 03/04/2019  Falls in the past year? 0 0 0 0 0  Number falls in past yr: 0 - 0 0 0  Injury with Fall? 0 - 0 0 0  Risk for fall due to : Impaired balance/gait;Impaired mobility - Impaired balance/gait History of fall(s);Impaired balance/gait -  Follow up Falls prevention discussed Falls evaluation completed Falls prevention discussed Falls prevention discussed -    FALL RISK PREVENTION PERTAINING TO THE HOME:  Any stairs in or around the home? Yes  If so, are there any without handrails? No  Home free of loose throw rugs in walkways, pet beds, electrical cords, etc? Yes  Adequate lighting in your home to reduce risk of falls? Yes   ASSISTIVE DEVICES UTILIZED TO PREVENT FALLS:  Life alert? No  Use of a cane, walker or w/c? Yes  Grab bars in the bathroom? Yes  Shower chair or bench in shower? No Elevated toilet seat or a handicapped toilet? Yes   TIMED UP AND GO:  Was the test performed? Yes .  Length of time to ambulate 10 feet: 7 sec.   Gait slow and steady with assistive device  Cognitive Function: Cognitive status assessed by direct observation. Patient has current diagnosis of developmental delay.      6CIT Screen 05/25/2020 05/20/2019 03/05/2018 06/02/2016  What Year? 4 points 4 points 4 points 4 points  What month? 0 points 0 points 0 points 0 points  What time? 0 points 0 points 0 points 0 points  Count back from 20 4 points 4 points 2 points 4 points  Months in reverse 4 points 4 points 4 points 4 points  Repeat phrase 10 points 10 points 6 points 4 points  Total Score 22 22 16 16     Immunizations Immunization History  Administered Date(s) Administered   Influenza Inj Mdck Quad Pf 05/08/2019   Influenza, High Dose Seasonal PF 04/13/2018, 04/24/2020, 05/02/2021   Influenza,inj,Quad PF,6+ Mos 05/12/2015, 04/05/2016,  03/16/2017   Influenza-Unspecified 05/10/2019   Moderna Covid-19 Vaccine Bivalent Booster 71yrs & up 05/06/2021   Moderna SARS-COV2 Booster  Vaccination 06/30/2020   Moderna Sars-Covid-2 Vaccination 08/22/2019, 09/19/2019   Pneumococcal Conjugate-13 04/05/2016   Pneumococcal Polysaccharide-23 01/06/2014, 09/13/2015   Zoster Recombinat (Shingrix) 05/08/2019    TDAP status: Due, Education has been provided regarding the importance of this vaccine. Advised may receive this vaccine at local pharmacy or Health Dept. Aware to provide a copy of the vaccination record if obtained from local pharmacy or Health Dept. Verbalized acceptance and understanding.  Flu Vaccine status: Up to date  Pneumococcal vaccine status: Up to date  Covid-19 vaccine status: Completed vaccines  Qualifies for Shingles Vaccine? Yes   Zostavax completed No   Shingrix Completed?: Yes - due for second dose  Screening Tests Health Maintenance  Topic Date Due   Zoster Vaccines- Shingrix (2 of 2) 07/03/2019   TETANUS/TDAP  06/04/2021 (Originally 12/02/1964)   MAMMOGRAM  07/13/2021   COLONOSCOPY (Pts 45-75yrs Insurance coverage will need to be confirmed)  12/13/2022   Pneumonia Vaccine 9+ Years old  Completed   INFLUENZA VACCINE  Completed   DEXA SCAN  Completed   COVID-19 Vaccine  Completed   Hepatitis C Screening  Completed   HPV VACCINES  Aged Out    Health Maintenance  Health Maintenance Due  Topic Date Due   Zoster Vaccines- Shingrix (2 of 2) 07/03/2019    Colorectal cancer screening: Type of screening: Colonoscopy. Completed 12/13/19. Repeat every 3 years  Mammogram status: Completed 07/13/20. Repeat every year  Bone Density status: Completed 07/13/20. Results reflect: Bone density results: NORMAL. Repeat every 2 years.  Lung Cancer Screening: (Low Dose CT Chest recommended if Age 35-80 years, 30 pack-year currently smoking OR have quit w/in 15years.) does not qualify.   Additional  Screening:  Hepatitis C Screening: does qualify; Completed 06/02/16  Vision Screening: Recommended annual ophthalmology exams for early detection of glaucoma and other disorders of the eye. Is the patient up to date with their annual eye exam?  Yes  Who is the provider or what is the name of the office in which the patient attends annual eye exams? Dr. Tempie Donning.   Dental Screening: Recommended annual dental exams for proper oral hygiene  Community Resource Referral / Chronic Care Management: CRR required this visit?  No   CCM required this visit?  No      Plan:     I have personally reviewed and noted the following in the patient's chart:   Medical and social history Use of alcohol, tobacco or illicit drugs  Current medications and supplements including opioid prescriptions.  Functional ability and status Nutritional status Physical activity Advanced directives List of other physicians Hospitalizations, surgeries, and ER visits in previous 12 months Vitals Screenings to include cognitive, depression, and falls Referrals and appointments  In addition, I have reviewed and discussed with patient certain preventive protocols, quality metrics, and best practice recommendations. A written personalized care plan for preventive services as well as general preventive health recommendations were provided to patient.     Clemetine Marker, LPN   76/01/2093   Nurse Notes: pt accompanied to visit today by her sister Callie. Pt advised due for appt with Dr. Army Melia for follow up.

## 2021-05-26 NOTE — Patient Instructions (Signed)
Taylor Johnson , Thank you for taking time to come for your Medicare Wellness Visit. I appreciate your ongoing commitment to your health goals. Please review the following plan we discussed and let me know if I can assist you in the future.   Screening recommendations/referrals: Colonoscopy: done 12/13/19. Repeat 11/2022 Mammogram: done 07/13/20. Please call [phone] to schedule your mammogram.  Bone Density: done 07/13/20 Recommended yearly ophthalmology/optometry visit for glaucoma screening and checkup Recommended yearly dental visit for hygiene and checkup  Vaccinations: Influenza vaccine: done 05/02/21 Pneumococcal vaccine: done 04/05/16 Tdap vaccine: due Shingles vaccine: 1st dose done 05/08/19; due for second dose   Covid-19:done 08/22/19, 09/19/19, 06/30/20 & 05/06/21  Conditions/risks identified: Recommend increasing physical activity  Next appointment: Follow up in one year for your annual wellness visit    Preventive Care 23 Years and Older, Female Preventive care refers to lifestyle choices and visits with your health care provider that can promote health and wellness. What does preventive care include? A yearly physical exam. This is also called an annual well check. Dental exams once or twice a year. Routine eye exams. Ask your health care provider how often you should have your eyes checked. Personal lifestyle choices, including: Daily care of your teeth and gums. Regular physical activity. Eating a healthy diet. Avoiding tobacco and drug use. Limiting alcohol use. Practicing safe sex. Taking low-dose aspirin every day. Taking vitamin and mineral supplements as recommended by your health care provider. What happens during an annual well check? The services and screenings done by your health care provider during your annual well check will depend on your age, overall health, lifestyle risk factors, and family history of disease. Counseling  Your health care provider  may ask you questions about your: Alcohol use. Tobacco use. Drug use. Emotional well-being. Home and relationship well-being. Sexual activity. Eating habits. History of falls. Memory and ability to understand (cognition). Work and work Statistician. Reproductive health. Screening  You may have the following tests or measurements: Height, weight, and BMI. Blood pressure. Lipid and cholesterol levels. These may be checked every 5 years, or more frequently if you are over 73 years old. Skin check. Lung cancer screening. You may have this screening every year starting at age 103 if you have a 30-pack-year history of smoking and currently smoke or have quit within the past 15 years. Fecal occult blood test (FOBT) of the stool. You may have this test every year starting at age 37. Flexible sigmoidoscopy or colonoscopy. You may have a sigmoidoscopy every 5 years or a colonoscopy every 10 years starting at age 41. Hepatitis C blood test. Hepatitis B blood test. Sexually transmitted disease (STD) testing. Diabetes screening. This is done by checking your blood sugar (glucose) after you have not eaten for a while (fasting). You may have this done every 1-3 years. Bone density scan. This is done to screen for osteoporosis. You may have this done starting at age 71. Mammogram. This may be done every 1-2 years. Talk to your health care provider about how often you should have regular mammograms. Talk with your health care provider about your test results, treatment options, and if necessary, the need for more tests. Vaccines  Your health care provider may recommend certain vaccines, such as: Influenza vaccine. This is recommended every year. Tetanus, diphtheria, and acellular pertussis (Tdap, Td) vaccine. You may need a Td booster every 10 years. Zoster vaccine. You may need this after age 57. Pneumococcal 13-valent conjugate (PCV13) vaccine. One dose is recommended  after age 21. Pneumococcal  polysaccharide (PPSV23) vaccine. One dose is recommended after age 42. Talk to your health care provider about which screenings and vaccines you need and how often you need them. This information is not intended to replace advice given to you by your health care provider. Make sure you discuss any questions you have with your health care provider. Document Released: 07/31/2015 Document Revised: 03/23/2016 Document Reviewed: 05/05/2015 Elsevier Interactive Patient Education  2017 [place] Prevention in the Home Falls can cause injuries. They can happen to people of all ages. There are many things you can do to make your home safe and to help prevent falls. What can I do on the outside of my home? Regularly fix the edges of walkways and driveways and fix any cracks. Remove anything that might make you trip as you walk through a door, such as a raised step or threshold. Trim any bushes or trees on the path to your home. Use bright outdoor lighting. Clear any walking paths of anything that might make someone trip, such as rocks or tools. Regularly check to see if handrails are loose or broken. Make sure that both sides of any steps have handrails. Any raised decks and porches should have guardrails on the edges. Have any leaves, snow, or ice cleared regularly. Use sand or salt on walking paths during winter. Clean up any spills in your garage right away. This includes oil or grease spills. What can I do in the bathroom? Use night lights. Install grab bars by the toilet and in the tub and shower. Do not use towel bars as grab bars. Use non-skid mats or decals in the tub or shower. If you need to sit down in the shower, use a plastic, non-slip stool. Keep the floor dry. Clean up any water that spills on the floor as soon as it happens. Remove soap buildup in the tub or shower regularly. Attach bath mats securely with double-sided non-slip rug tape. Do not have throw rugs and other  things on the floor that can make you trip. What can I do in the bedroom? Use night lights. Make sure that you have a light by your bed that is easy to reach. Do not use any sheets or blankets that are too big for your bed. They should not hang down onto the floor. Have a firm chair that has side arms. You can use this for support while you get dressed. Do not have throw rugs and other things on the floor that can make you trip. What can I do in the kitchen? Clean up any spills right away. Avoid walking on wet floors. Keep items that you use a lot in easy-to-reach places. If you need to reach something above you, use a strong step stool that has a grab bar. Keep electrical cords out of the way. Do not use floor polish or wax that makes floors slippery. If you must use wax, use non-skid floor wax. Do not have throw rugs and other things on the floor that can make you trip. What can I do with my stairs? Do not leave any items on the stairs. Make sure that there are handrails on both sides of the stairs and use them. Fix handrails that are broken or loose. Make sure that handrails are as long as the stairways. Check any carpeting to make sure that it is firmly attached to the stairs. Fix any carpet that is loose or worn. Avoid having throw  rugs at the top or bottom of the stairs. If you do have throw rugs, attach them to the floor with carpet tape. Make sure that you have a light switch at the top of the stairs and the bottom of the stairs. If you do not have them, ask someone to add them for you. What else can I do to help prevent falls? Wear shoes that: Do not have high heels. Have rubber bottoms. Are comfortable and fit you well. Are closed at the toe. Do not wear sandals. If you use a stepladder: Make sure that it is fully opened. Do not climb a closed stepladder. Make sure that both sides of the stepladder are locked into place. Ask someone to hold it for you, if possible. Clearly  mark and make sure that you can see: Any grab bars or handrails. First and last steps. Where the edge of each step is. Use tools that help you move around (mobility aids) if they are needed. These include: Canes. Walkers. Scooters. Crutches. Turn on the lights when you go into a dark area. Replace any light bulbs as soon as they burn out. Set up your furniture so you have a clear path. Avoid moving your furniture around. If any of your floors are uneven, fix them. If there are any pets around you, be aware of where they are. Review your medicines with your doctor. Some medicines can make you feel dizzy. This can increase your chance of falling. Ask your doctor what other things that you can do to help prevent falls. This information is not intended to replace advice given to you by your health care provider. Make sure you discuss any questions you have with your health care provider. Document Released: 04/30/2009 Document Revised: 12/10/2015 Document Reviewed: 08/08/2014 Elsevier Interactive Patient Education  2017 [employer].

## 2021-06-05 ENCOUNTER — Other Ambulatory Visit: Payer: Self-pay | Admitting: Gastroenterology

## 2021-06-05 ENCOUNTER — Other Ambulatory Visit: Payer: Self-pay | Admitting: Internal Medicine

## 2021-06-05 DIAGNOSIS — R030 Elevated blood-pressure reading, without diagnosis of hypertension: Secondary | ICD-10-CM

## 2021-06-05 DIAGNOSIS — K2970 Gastritis, unspecified, without bleeding: Secondary | ICD-10-CM

## 2021-06-05 NOTE — Telephone Encounter (Signed)
Requested Prescriptions  Pending Prescriptions Disp Refills  . meclizine (ANTIVERT) 12.5 MG tablet [Pharmacy Med Name: meclizine 12.5 mg tablet] 30 tablet     Sig: TAKE ONE TABLET BY MOUTH THREE TIMES DAILY AS NEEDED FOR DIZZINESS     Not Delegated - Gastroenterology: Antiemetics Failed - 06/05/2021 11:47 AM      Failed - This refill cannot be delegated      Failed - Valid encounter within last 6 months    Recent Outpatient Visits          1 year ago Benign essential HTN   [facility] [name], MD   1 year ago Benign essential HTN   [facility] [name], MD   2 years ago Benign essential HTN   [facility] [name], MD   2 years ago Gastritis without bleeding, unspecified chronicity, unspecified gastritis type   [facility] [name], MD   2 years ago Rectal bleeding   [facility] [name], MD      Future Appointments            In 1 week Vanga, Tally Due, MD [place]   In 11 months Army Melia, Jesse Sans, MD [facility], [place]           . metoCLOPramide (REGLAN) 5 MG tablet [Pharmacy Med Name: metoclopramide 5 mg tablet] 84 tablet     Sig: TAKE ONE TABLET BY MOUTH EVERY MORNING, [place], [place]     Not Delegated - Gastroenterology: Antiemetics Failed - 06/05/2021 11:47 AM      Failed - This refill cannot be delegated      Failed - Valid encounter within last 6 months    Recent Outpatient Visits          1 year ago Benign essential HTN   [facility] [name], MD   1 year ago Benign essential HTN   [facility] [name], MD   2 years ago Benign essential HTN   [facility] [name], MD   2 years ago Gastritis without bleeding, unspecified chronicity, unspecified gastritis type   [facility] [name], MD   2 years ago Rectal bleeding   [facility] [name], MD       Future Appointments            In 1 week Vanga, Tally Due, MD [place]   In 11 months Army Melia, Jesse Sans, MD [facility]           . hydrALAZINE (APRESOLINE) 25 MG tablet [Pharmacy Med Name: hydralazine 25 mg tablet] 84 tablet 0    Sig: TAKE ONE TABLET BY MOUTH THREE TIMES DAILY     Cardiovascular:  Vasodilators Failed - 06/05/2021 11:47 AM      Failed - Valid encounter within last 12 months    Recent Outpatient Visits          1 year ago Benign essential HTN   [facility] [name], MD   1 year ago Benign essential HTN   [facility] [name], MD   2 years ago Benign essential HTN   [facility] [name], MD   2 years ago Gastritis without bleeding, unspecified chronicity, unspecified gastritis type   [facility] [name], MD   2 years ago Rectal bleeding  [facility] [name], MD      Future Appointments            In 1 week Vanga, Tally Due, MD [place]   In 11 months Army Melia, Jesse Sans, MD [facility], [place] - HCT in normal range and within 360 days    HCT  Date Value Ref Range Status  02/05/2021 40.6 36.0 - 46.0 % Final   Hematocrit  Date Value Ref Range Status  06/04/2020 40.3 34.0 - 46.6 % Final         Passed - HGB in normal range and within 360 days    Hemoglobin  Date Value Ref Range Status  02/05/2021 12.2 12.0 - 15.0 g/dL Final  06/04/2020 12.8 11.1 - 15.9 g/dL Final         Passed - RBC in normal range and within 360 days    RBC  Date Value Ref Range Status  02/05/2021 4.42 3.87 - 5.11 MIL/uL Final         Passed - WBC in normal range and within 360 days    WBC  Date Value Ref Range Status  02/05/2021 6.8 4.0 - 10.5 K/uL Final         Passed - PLT in normal range and within 360 days    Platelets  Date Value Ref Range Status  02/05/2021 192 150 - 400 K/uL Final   06/04/2020 216 150 - 450 x10E3/uL Final         Passed - Last BP in normal range    BP Readings from Last 1 Encounters:  05/26/21 130/80

## 2021-06-05 NOTE — Telephone Encounter (Signed)
Requested medication (s) are due for refill today: yes  Requested medication (s) are on the active medication list: yes  Last refill:  12/18/20 for both meds  Future visit scheduled: yes  Notes to clinic:  meds not delegated to NT to RF   Requested Prescriptions  Pending Prescriptions Disp Refills   meclizine (ANTIVERT) 12.5 MG tablet [Pharmacy Med Name: meclizine 12.5 mg tablet] 30 tablet     Sig: TAKE ONE TABLET BY MOUTH [place]     Not Delegated - Gastroenterology: Antiemetics Failed - 06/05/2021 11:47 AM      Failed - This refill cannot be delegated      Failed - Valid encounter within last 6 months    Recent Outpatient Visits           1 year ago Benign essential HTN   [facility] [name], MD   1 year ago Benign essential HTN   [facility] [name], MD   2 years ago Benign essential HTN   [facility] [name], MD   2 years ago Gastritis without bleeding, unspecified chronicity, unspecified gastritis type   [facility] [name], MD   2 years ago Rectal bleeding   [facility] [name], MD       Future Appointments             In 1 week Cal[place], Tally Due, MD [place]   In 11 months Army Melia, Jesse Sans, MD [facility]             metoCLOPramide (REGLAN) 5 MG tablet [Pharmacy Med Name: metoclopramide 5 mg tablet] 84 tablet     Sig: TAKE ONE TABLET BY MOUTH EVERY MORNING, [place], [place]     Not Delegated - Gastroenterology: Antiemetics Failed - 06/05/2021 11:47 AM      Failed - This refill cannot be delegated      Failed - Valid encounter within last 6 months    Recent Outpatient Visits           1 year ago Benign essential HTN   [facility] [name], MD   1 year ago Benign essential HTN   [facility] [name], MD   2 years ago Benign essential HTN   [facility] [name], MD   2 years ago Gastritis without bleeding, unspecified chronicity, unspecified gastritis type   [facility] [name], MD   2 years ago Rectal bleeding   [facility] [name], MD       Future Appointments             In 1 week Vanga, Tally Due, MD [place]   In 11 months Army Melia Jesse Sans, MD [facility]            Signed Prescriptions Disp Refills   hydrALAZINE (APRESOLINE) 25 MG tablet 84 tablet 0    Sig: TAKE ONE TABLET BY MOUTH THREE TIMES DAILY     Cardiovascular:  Vasodilators Failed - 06/05/2021 11:47 AM      Failed - Valid encounter within last 12 months    Recent Outpatient Visits           1 year ago Benign essential HTN   [facility] [name], MD   1 year ago Benign essential HTN  [facility] [name], MD   2 years ago Benign essential HTN   [facility] [name], MD   2 years ago Gastritis without bleeding, unspecified chronicity, unspecified gastritis type   [facility] [name], MD   2 years ago Rectal bleeding   [facility] [name], MD       Future Appointments             In 1 week Vanga, Tally Due, MD [place]   In 11 months Army Melia, Jesse Sans, MD [facility], [place] - HCT in normal range and within 360 days    HCT  Date Value Ref Range Status  02/05/2021 40.6 36.0 - 46.0 % Final   Hematocrit  Date Value Ref Range Status  06/04/2020 40.3 34.0 - 46.6 % Final          Passed - HGB in normal range and within 360 days    Hemoglobin  Date Value Ref Range Status  02/05/2021 12.2 12.0 - 15.0 g/dL Final  06/04/2020 12.8 11.1 - 15.9 g/dL Final          Passed - RBC in normal range and within 360 days    RBC  Date Value Ref Range Status  02/05/2021 4.42 3.87 - 5.11 MIL/uL Final          Passed -  WBC in normal range and within 360 days    WBC  Date Value Ref Range Status  02/05/2021 6.8 4.0 - 10.5 K/uL Final          Passed - PLT in normal range and within 360 days    Platelets  Date Value Ref Range Status  02/05/2021 192 150 - 400 K/uL Final  06/04/2020 216 150 - 450 x10E3/uL Final          Passed - Last BP in normal range    BP Readings from Last 1 Encounters:  05/26/21 130/80

## 2021-06-07 ENCOUNTER — Observation Stay
Admission: EM | Admit: 2021-06-07 | Discharge: 2021-06-09 | Disposition: A | Discharge status: Home / Self Care | Payer: Medicare Other | Attending: Pediatrics | Admitting: Pediatrics

## 2021-06-07 ENCOUNTER — Other Ambulatory Visit: Payer: Self-pay

## 2021-06-07 ENCOUNTER — Emergency Department: Payer: Medicare Other

## 2021-06-07 DIAGNOSIS — I10 Essential (primary) hypertension: Secondary | ICD-10-CM | POA: Diagnosis not present

## 2021-06-07 DIAGNOSIS — Z79899 Other long term (current) drug therapy: Secondary | ICD-10-CM | POA: Diagnosis not present

## 2021-06-07 DIAGNOSIS — N179 Acute kidney failure, unspecified: Secondary | ICD-10-CM | POA: Diagnosis not present

## 2021-06-07 DIAGNOSIS — K8689 Other specified diseases of pancreas: Secondary | ICD-10-CM | POA: Diagnosis not present

## 2021-06-07 DIAGNOSIS — K922 Gastrointestinal hemorrhage, unspecified: Secondary | ICD-10-CM | POA: Diagnosis not present

## 2021-06-07 DIAGNOSIS — Z20822 Contact with and (suspected) exposure to covid-19: Secondary | ICD-10-CM | POA: Insufficient documentation

## 2021-06-07 DIAGNOSIS — R109 Unspecified abdominal pain: Secondary | ICD-10-CM | POA: Diagnosis not present

## 2021-06-07 LAB — CBC
HCT: 37.7 % (ref 36.0–46.0)
Hemoglobin: 11.2 g/dL — ABNORMAL LOW (ref 12.0–15.0)
MCH: 27.8 pg (ref 26.0–34.0)
MCHC: 29.7 g/dL — ABNORMAL LOW (ref 30.0–36.0)
MCV: 93.5 fL (ref 80.0–100.0)
Platelets: 197 10*3/uL (ref 150–400)
RBC: 4.03 MIL/uL (ref 3.87–5.11)
RDW: 16 % — ABNORMAL HIGH (ref 11.5–15.5)
WBC: 6.9 10*3/uL (ref 4.0–10.5)
nRBC: 0 % (ref 0.0–0.2)

## 2021-06-07 LAB — COMPREHENSIVE METABOLIC PANEL
ALT: 8 U/L (ref 0–44)
AST: 12 U/L — ABNORMAL LOW (ref 15–41)
Albumin: 3.7 g/dL (ref 3.5–5.0)
Alkaline Phosphatase: 69 U/L (ref 38–126)
Anion gap: 6 (ref 5–15)
BUN: 28 mg/dL — ABNORMAL HIGH (ref 8–23)
CO2: 29 mmol/L (ref 22–32)
Calcium: 9.1 mg/dL (ref 8.9–10.3)
Chloride: 104 mmol/L (ref 98–111)
Creatinine, Ser: 1.22 mg/dL — ABNORMAL HIGH (ref 0.44–1.00)
GFR, Estimated: 47 mL/min — ABNORMAL LOW (ref >60)
Glucose, Bld: 103 mg/dL — ABNORMAL HIGH (ref 70–99)
Potassium: 4 mmol/L (ref 3.5–5.1)
Sodium: 139 mmol/L (ref 135–145)
Total Bilirubin: 0.4 mg/dL (ref 0.3–1.2)
Total Protein: 7.3 g/dL (ref 6.5–8.1)

## 2021-06-07 LAB — LIPASE, BLOOD: Lipase: 21 U/L (ref 11–51)

## 2021-06-07 MED ORDER — PANTOPRAZOLE 80MG IVPB - SIMPLE MED
80.0000 mg | Freq: Once | INTRAVENOUS | Status: AC
  Administered 2021-06-07: 80 mg via INTRAVENOUS
  Filled 2021-06-07: qty 80

## 2021-06-07 MED ORDER — PANTOPRAZOLE SODIUM 40 MG IV SOLR: 40.0000 mg | Freq: Two times a day (BID) | INTRAVENOUS | Status: DC

## 2021-06-07 MED ORDER — PANCRELIPASE (LIP-PROT-AMYL) 36000-114000 UNITS PO CPEP: ORAL_CAPSULE | ORAL | 2 refills | Status: DC

## 2021-06-07 MED ORDER — PANTOPRAZOLE INFUSION (NEW) - SIMPLE MED
8.0000 mg/h | INTRAVENOUS | Status: DC
  Administered 2021-06-07 – 2021-06-08 (×2): 8 mg/h via INTRAVENOUS
  Filled 2021-06-07: qty 100
  Filled 2021-06-07: qty 80

## 2021-06-07 MED ORDER — IOHEXOL 300 MG/ML  SOLN
100.0000 mL | Freq: Once | INTRAMUSCULAR | Status: AC | PRN
  Administered 2021-06-07: 100 mL via INTRAVENOUS

## 2021-06-07 NOTE — ED Triage Notes (Signed)
Pt to ED for dark stools that started today. Denies n/v/d. NAD noted. Reports lower abd cramping

## 2021-06-07 NOTE — ED Provider Notes (Signed)
Emergency Medicine Provider Triage Evaluation Note  Taylor Johnson , a 75 y.o. female  was evaluated in triage.  Pt complains of dark dried blood in stools.  History of GI bleed, complaining of lower abdominal pain.  Review of Systems  Positive: Blood in stool, abdominal pain Negative: No chest pain or shortness of breath, weakness  Physical Exam  BP 120/68   Pulse 72   Temp 98.8 F (37.1 C) (Oral)   Resp 20   Ht 5' (1.524 m)   Wt 113.4 kg   SpO2 94%   BMI 48.82 kg/m  Gen:   Awake, no distress   Resp:  Normal effort  MSK:   Moves extremities without difficulty  Other:    Medical Decision Making  Medically screening exam initiated at 2:48 PM.  Appropriate orders placed.  Taylor Johnson was informed that the remainder of the evaluation will be completed by another provider, this initial triage assessment does not replace that evaluation, and the importance of remaining in the ED until their evaluation is complete.     Taylor Starks, PA-C 06/07/21 1448    Taylor Drafts, MD 06/11/21 1324

## 2021-06-07 NOTE — ED Notes (Signed)
Pt to CT

## 2021-06-07 NOTE — ED Provider Notes (Signed)
[facility] Emergency Department Provider Note  ____________________________________________   I have reviewed the triage vital signs and the nursing notes.   HISTORY  Chief Complaint Abdominal Pain and Rectal Bleeding   History limited by: Not Limited   HPI Taylor Johnson is a 75 y.o. female who presents to the emergency department today because of concerns for bloody stool.  Patient states she first noticed some blood in her stool earlier today.  More recently as she started to use the bathroom is been more clots of blood.  This has been accompanied by some abdominal pain.  She has had GI bleeding in the past.  She also has history of diverticulosis.  Patient denies any fevers recently.  Denies any nausea or vomiting.  Records reviewed. Per medical record review patient has a history of diverticulosis. GI bleed in the past.   Past Medical History:  Diagnosis Date   Abdominal pain 01/27/2017   Anxiety    Anxiety, generalized 05/11/2015   Cough 06/02/2016   Chronic - followed by Pulmonary   Cough 06/02/2016   Chronic - followed by Pulmonary   Depression    Developmental delay    Diverticulitis    Diverticulosis of colon 05/11/2015   Dysphagia 10/15/2015   Routine Ba Swallow normal; rec modified barium swallow    GERD (gastroesophageal reflux disease)    Hematochezia 11/07/2019   Hypertension    Hypoxia 09/12/2015   Leg swelling    Localized edema 04/15/2016   Microscopic hematuria 04/15/2016   Nausea and vomiting 01/29/2017   Orthostatic hypotension 03/16/2017   OSA on CPAP 11/18/2015   CPAP @ 18 cm H2O started 02/2016   Rectal bleeding 10/15/2018   Urge incontinence of urine 05/12/2015    Patient Active Problem List   Diagnosis Date Noted   CPAP use counseling 08/10/2020   Morbid obesity ([place]) 08/10/2020   Abdominal pain 11/07/2019   GERD (gastroesophageal reflux disease) 11/07/2019   Dizziness 02/04/2019   Benign essential HTN  10/23/2018   Functional systolic murmur 40/98/1191   Developmental delay disorder 02/06/2017   Calculus of gallbladder without cholecystitis without obstruction 01/20/2017   Localized edema 04/15/2016   Microscopic hematuria 04/15/2016   OSA on CPAP 11/18/2015   Urge incontinence of urine 05/12/2015   Diverticulosis of colon 05/11/2015   Anxiety, generalized 05/11/2015    Past Surgical History:  Procedure Laterality Date   ABDOMINAL HYSTERECTOMY     BREAST BIOPSY Left    neg   COLON SURGERY     COLONOSCOPY WITH PROPOFOL N/A 12/13/2019   Procedure: COLONOSCOPY WITH PROPOFOL;  Surgeon: Lin Landsman, MD;  Location: [facility] ENDOSCOPY;  Service: Endoscopy;  Laterality: N/A;   ESOPHAGOGASTRODUODENOSCOPY (EGD) WITH PROPOFOL N/A 01/29/2017   Procedure: ESOPHAGOGASTRODUODENOSCOPY (EGD) WITH PROPOFOL;  Surgeon: Wilford Corner, MD;  Location: [facility] ENDOSCOPY;  Service: Endoscopy;  Laterality: N/A;    Prior to Admission medications   Medication Sig Start Date End Date Taking? Authorizing Provider  CREON 36000-114000 units CPEP capsule Take 2 capsules with the first bite of each meal and 1 capsule with the first bite of each snack 06/07/21   Vanga, Tally Due, MD  dicyclomine (BENTYL) 20 MG tablet Take 1 tablet (20 mg total) by mouth every 8 (eight) hours as needed for spasms (Abdominal cramping). 02/05/21   Ward, Delice Bison, DO  hydrALAZINE (APRESOLINE) 25 MG tablet TAKE ONE TABLET BY MOUTH THREE TIMES DAILY 06/05/21   [name], MD  lipase/protease/amylase (CREON) 36000 UNITS  CPEP capsule Take 2 capsules with the first bite of each meal and 1 capsule with the first bite of each snack 06/07/21   Lin Landsman, MD  meclizine (ANTIVERT) 12.5 MG tablet TAKE ONE TABLET BY MOUTH THREE TIMES DAILY AS NEEDED FOR DIZZINESS 12/18/20   [name], MD  metoCLOPramide (REGLAN) 5 MG tablet TAKE ONE TABLET BY MOUTH EVERY MORNING, [place], [place] Patient not taking: Reported on 05/26/2021  12/18/20   [name], MD    Allergies Patient has no known allergies.  Family History  Problem Relation Age of Onset   Hypertension Mother    Kidney disease Mother    Dementia Mother    Cancer Father        lung   Bladder Cancer Neg Hx    Breast cancer Neg Hx     Social History Social History   Tobacco Use   Smoking status: Never   Smokeless tobacco: Never   Tobacco comments:    smoking cessation materials not required  Vaping Use   Vaping Use: Never used  Substance Use Topics   Alcohol use: No    Alcohol/week: 0.0 standard drinks   Drug use: No    Review of Systems Constitutional: No fever/chills Eyes: No visual changes. ENT: No sore throat. Cardiovascular: Denies chest pain. Respiratory: Denies shortness of breath. Gastrointestinal: Positive for abdominal pain, bloody stool. Genitourinary: Negative for dysuria. Musculoskeletal: Negative for back pain. Skin: Negative for rash. Neurological: Negative for headaches, focal weakness or numbness.  ____________________________________________   PHYSICAL EXAM:  VITAL SIGNS: ED Triage Vitals  Enc Vitals Group     BP 06/07/21 1431 120/68     Pulse Rate 06/07/21 1431 72     Resp 06/07/21 1431 20     Temp 06/07/21 1431 98.8 F (37.1 C)     Temp Source 06/07/21 1431 Oral     SpO2 06/07/21 1431 94 %     Weight 06/07/21 1432 250 lb (113.4 kg)     Height 06/07/21 1432 5' (1.524 m)     Head Circumference --      Peak Flow --      Pain Score 06/07/21 1432 5   Constitutional: Alert and oriented.  Eyes: Conjunctivae are normal.  ENT      Head: Normocephalic and atraumatic.      Nose: No congestion/rhinnorhea.      Mouth/Throat: Mucous membranes are moist.      Neck: No stridor. Hematological/Lymphatic/Immunilogical: No cervical lymphadenopathy. Cardiovascular: Normal rate, regular rhythm.  No murmurs, rubs, or gallops.  Respiratory: Normal respiratory effort without tachypnea nor retractions. Breath  sounds are clear and equal bilaterally. No wheezes/rales/rhonchi. Gastrointestinal: Soft and non tender. No rebound. No guarding.  Genitourinary: Deferred Musculoskeletal: Normal range of motion in all extremities. No lower extremity edema. Neurologic:  Normal speech and language. No gross focal neurologic deficits are appreciated.  Skin:  Skin is warm, dry and intact. No rash noted. Psychiatric: Mood and affect are normal. Speech and behavior are normal. Patient exhibits appropriate insight and judgment.  ____________________________________________    LABS (pertinent positives/negatives)  Lipase 21 CBC wbc 6.9, hgb 11.2, plt 197 CMP na 139, k 4.0, glu 103, cr 1.22  ____________________________________________   EKG  I, Nance Pear, attending physician, personally viewed and interpreted this EKG  EKG Time: 1444 Rate: 69 Rhythm: normal sinus rhythm Axis: normal Intervals: qtc 396 QRS: narrow ST changes: no st elevation Impression: normal ekg   ____________________________________________    RADIOLOGY  Ct abd/pel IMPRESSION:  1. Motion limited exam.  2. Distended gallbladder. Limited assessment for pericholecystic fat  stranding due to motion. There is mild central intrahepatic biliary  ductal dilatation. Consider further evaluation with right upper  quadrant ultrasound. Gallstone demonstrated on prior exams is  obscured by motion.  3. Extensive pancolonic diverticulosis without diverticulitis.  4. Enlarged fibroid uterus.  5. Chronic cardiomegaly with unchanged small pericardial effusion.      ___________________________________________   PROCEDURES  Procedures  ____________________________________________   INITIAL IMPRESSION / ASSESSMENT AND PLAN / ED COURSE  Pertinent labs & imaging results that were available during my care of the patient were reviewed by me and considered in my medical decision making (see chart for details).   Patient  presented to the emergency department today because of concern for GI bleed. Initial blood work without any significant anemia. However rectal exam is positive for reddish blood. Patient has history of diverticulosis. CT scan was obtained which did not show any evidence of inflammation. Was some question of gallbladder issue but patient is non tender over that area and no significant lft abnormalities. Will however plan on admission for gi bleed. Discussed findings and plan with patient.   ____________________________________________   FINAL CLINICAL IMPRESSION(S) / ED DIAGNOSES  Final diagnoses:  Gastrointestinal hemorrhage, unspecified gastrointestinal hemorrhage type     Note: This dictation was prepared with Dragon dictation. Any transcriptional errors that result from this process are unintentional     Nance Pear, MD 06/07/21 2300

## 2021-06-07 NOTE — Progress Notes (Signed)
Refilled medicine as requested 

## 2021-06-07 NOTE — H&P (Addendum)
[place]   PATIENT NAME: Taylor Johnson    MR#:  [id number]  DATE OF BIRTH:  1945/11/25  DATE OF ADMISSION:  06/07/2021  PRIMARY CARE PHYSICIAN: [name], MD   Patient is coming from: Home  REQUESTING/REFERRING PHYSICIAN: Nance Pear, MD  CHIEF COMPLAINT:   Chief Complaint  Patient presents with   Abdominal Pain   Rectal Bleeding    HISTORY OF PRESENT ILLNESS:  Taylor Johnson is a 75 y.o. female with medical history significant for GERD, hypertension, obstructive sleep apnea and previous history of GI bleeding, who presented to the ER with acute onset of bright red bleeding per rectum with associated blood clots.  She denies any nausea or vomiting or heartburn or abdominal pain.  She admitted to occasional melena.  No fever or chills.  No chest pain or dyspnea or palpitations.  ED Course: Upon presenting to the emergency room, heart rate was 58 with otherwise normal vital signs.  Labs revealed a BUN of 32 with otherwise unremarkable BMP.  CBC showed hemoglobin of 11.2 hematocrit 37.7  EKG as reviewed by me : EKG showed normal sinus rhythm with rate of 69 with poor R wave progression Imaging: Lexiscan revealed: 1. Motion limited exam. 2. Distended gallbladder. Limited assessment for pericholecystic fat stranding due to motion. There is mild central intrahepatic biliary ductal dilatation. Consider further evaluation with right upper quadrant ultrasound. Gallstone demonstrated on prior exams is obscured by motion. 3. Extensive pancolonic diverticulosis without diverticulitis. 4. Enlarged fibroid uterus. 5. Chronic cardiomegaly with unchanged small pericardial effusion.  The patient was given 80 mg IV Protonix bolus followed by IV Protonix drip.  She will be admitted to a medical telemetry bed for further evaluation and management.   PAST MEDICAL HISTORY:   Past Medical History:  Diagnosis Date   Abdominal pain 01/27/2017   Anxiety    Anxiety,  generalized 05/11/2015   Cough 06/02/2016   Chronic - followed by Pulmonary   Cough 06/02/2016   Chronic - followed by Pulmonary   Depression    Developmental delay    Diverticulitis    Diverticulosis of colon 05/11/2015   Dysphagia 10/15/2015   Routine Ba Swallow normal; rec modified barium swallow    GERD (gastroesophageal reflux disease)    Hematochezia 11/07/2019   Hypertension    Hypoxia 09/12/2015   Leg swelling    Localized edema 04/15/2016   Microscopic hematuria 04/15/2016   Nausea and vomiting 01/29/2017   Orthostatic hypotension 03/16/2017   OSA on CPAP 11/18/2015   CPAP @ 18 cm H2O started 02/2016   Rectal bleeding 10/15/2018   Urge incontinence of urine 05/12/2015    PAST SURGICAL HISTORY:   Past Surgical History:  Procedure Laterality Date   ABDOMINAL HYSTERECTOMY     BREAST BIOPSY Left    neg   COLON SURGERY     COLONOSCOPY WITH PROPOFOL N/A 12/13/2019   Procedure: COLONOSCOPY WITH PROPOFOL;  Surgeon: Lin Landsman, MD;  Location: [place];  Service: Endoscopy;  Laterality: N/A;   ESOPHAGOGASTRODUODENOSCOPY (EGD) WITH PROPOFOL N/A 01/29/2017   Procedure: ESOPHAGOGASTRODUODENOSCOPY (EGD) WITH PROPOFOL;  Surgeon: Wilford Corner, MD;  Location: [facility] ENDOSCOPY;  Service: Endoscopy;  Laterality: N/A;    SOCIAL HISTORY:   Social History   Tobacco Use   Smoking status: Never   Smokeless tobacco: Never   Tobacco comments:    smoking cessation materials not required  Substance Use Topics   Alcohol use: No    Alcohol/week:  0.0 standard drinks    FAMILY HISTORY:   Family History  Problem Relation Age of Onset   Hypertension Mother    Kidney disease Mother    Dementia Mother    Cancer Father        lung   Bladder Cancer Neg Hx    Breast cancer Neg Hx     DRUG ALLERGIES:  No Known Allergies  REVIEW OF SYSTEMS:   ROS As per history of present illness. All pertinent systems were reviewed above. Constitutional, HEENT, cardiovascular,  respiratory, GI, GU, musculoskeletal, neuro, psychiatric, endocrine, integumentary and hematologic systems were reviewed and are otherwise negative/unremarkable except for positive findings mentioned above in the HPI.   MEDICATIONS AT HOME:   Prior to Admission medications   Medication Sig Start Date End Date Taking? Authorizing Provider  CREON 36000-114000 units CPEP capsule Take 2 capsules with the first bite of each meal and 1 capsule with the first bite of each snack 06/07/21   Vanga, Tally Due, MD  dicyclomine (BENTYL) 20 MG tablet Take 1 tablet (20 mg total) by mouth every 8 (eight) hours as needed for spasms (Abdominal cramping). 02/05/21   Ward, Delice Bison, DO  hydrALAZINE (APRESOLINE) 25 MG tablet TAKE ONE TABLET BY MOUTH THREE TIMES DAILY 06/05/21   [name], MD  lipase/protease/amylase (CREON) 36000 UNITS CPEP capsule Take 2 capsules with the first bite of each meal and 1 capsule with the first bite of each snack 06/07/21   Lin Landsman, MD  meclizine (ANTIVERT) 12.5 MG tablet TAKE ONE TABLET BY MOUTH THREE TIMES DAILY AS NEEDED FOR DIZZINESS 12/18/20   [name], MD  metoCLOPramide (REGLAN) 5 MG tablet TAKE ONE TABLET BY MOUTH EVERY MORNING, [place], Organ Patient not taking: Reported on 05/26/2021 12/18/20   [name], MD      VITAL SIGNS:  Blood pressure (!) 163/73, pulse 73, temperature 98.8 F (37.1 C), temperature source Oral, resp. rate 17, height 5' (1.524 m), weight 113.4 kg, SpO2 99 %.  PHYSICAL EXAMINATION:  Physical Exam  GENERAL:  75 y.o.-year-old Afro-American female patient lying in the bed with no acute distress.  EYES: Pupils equal, round, reactive to light and accommodation. No scleral icterus. Extraocular muscles intact.  HEENT: Head atraumatic, normocephalic. Oropharynx and nasopharynx clear.  NECK:  Supple, no jugular venous distention. No thyroid enlargement, no tenderness.  LUNGS: Normal breath sounds bilaterally, no wheezing,  rales,rhonchi or crepitation. No use of accessory muscles of respiration.  CARDIOVASCULAR: Regular rate and rhythm, S1, S2 normal. No murmurs, rubs, or gallops.  ABDOMEN: Soft, nondistended, nontender. Bowel sounds present. No organomegaly or mass.  EXTREMITIES: No pedal edema, cyanosis, or clubbing.  NEUROLOGIC: Cranial nerves II through XII are intact. Muscle strength 5/5 in all extremities. Sensation intact. Gait not checked.  PSYCHIATRIC: The patient is alert and oriented x 3.  Normal affect and good eye contact. SKIN: No obvious rash, lesion, or ulcer.   LABORATORY PANEL:   CBC Recent Labs  Lab 06/07/21 1435  WBC 6.9  HGB 11.2*  HCT 37.7  PLT 197   ------------------------------------------------------------------------------------------------------------------  Chemistries  Recent Labs  Lab 06/07/21 1435  NA 139  K 4.0  CL 104  CO2 29  GLUCOSE 103*  BUN 28*  CREATININE 1.22*  CALCIUM 9.1  AST 12*  ALT 8  ALKPHOS 69  BILITOT 0.4   ------------------------------------------------------------------------------------------------------------------  Cardiac Enzymes No results for input(s): TROPONINI in the last 168 hours. ------------------------------------------------------------------------------------------------------------------  RADIOLOGY:  CT ABDOMEN PELVIS  W CONTRAST  Result Date: 06/07/2021 CLINICAL DATA:  Abdominal pain. GI bleed. Dark stools today. Lower abdominal cramping. EXAM: CT ABDOMEN AND PELVIS WITH CONTRAST TECHNIQUE: Multidetector CT imaging of the abdomen and pelvis was performed using the standard protocol following bolus administration of intravenous contrast. CONTRAST:  147mL OMNIPAQUE IOHEXOL 300 MG/ML  SOLN COMPARISON:  Most recent CT 02/05/2021 FINDINGS: Lower chest: Breathing motion artifact. Lingular scarring. Chronic cardiomegaly. Circumferential small pericardial effusion which is unchanged from prior exam. Hepatobiliary: Motion artifact  through the upper abdomen. The gallbladder is distended. Limited assessment for pericholecystic fat stranding due to motion. Known gallstone is obscured by motion. Common bile duct is difficult to delineate. There is mild central intrahepatic biliary ductal dilatation. Calcified granuloma at the hepatic dome. No suspicious liver lesion. Pancreas: Motion artifact through the pancreas. No definite peripancreatic inflammation. No pancreatic ductal dilatation. Spleen: Normal in size without focal abnormality. Adrenals/Urinary Tract: Normal adrenal glands. No hydronephrosis or perinephric edema. Homogeneous renal enhancement with symmetric excretion on delayed phase imaging. Tiny subcentimeter hypodensity in the medial kidney, too small to characterize. Urinary bladder is physiologically distended without wall thickening. Stomach/Bowel: Motion obscures detailed bowel assessment. The stomach is unremarkable. There is a duodenal diverticulum. No obvious small bowel inflammation or obstruction. Extensive colonic diverticulosis. No diverticulitis or acute colonic inflammation. Colonic redundancy. High-riding cecum in the mid abdomen. Appendix not definitively identified. No appendicitis. Vascular/Lymphatic: Normal caliber abdominal aorta patent portal vein. No enlarged lymph nodes in the abdomen or pelvis. Reproductive: Enlarged uterus with multiple fibroids, many of which are calcified. Ovaries are not well delineated, no obvious adnexal mass. Other: No free air or free fluid.  No abdominopelvic collection. Musculoskeletal: There are no acute or suspicious osseous abnormalities. IMPRESSION: 1. Motion limited exam. 2. Distended gallbladder. Limited assessment for pericholecystic fat stranding due to motion. There is mild central intrahepatic biliary ductal dilatation. Consider further evaluation with right upper quadrant ultrasound. Gallstone demonstrated on prior exams is obscured by motion. 3. Extensive pancolonic  diverticulosis without diverticulitis. 4. Enlarged fibroid uterus. 5. Chronic cardiomegaly with unchanged small pericardial effusion. Electronically Signed   By: Keith Rake M.D.   On: 06/07/2021 22:00      IMPRESSION AND PLAN:  Principal Problem:   GI bleeding 1. GI bleeding. - The patient was admitted to a medical telemetry bed. - We will follow serial hemoglobins and hematocrits. - She will be hydrated with IV normal saline. - The patient will be continued for now on IV Protonix drip - GI consultation will be obtained. - I notified Dr. Virgina Jock about the patient.  2.  Mild acute kidney injury. - The patient will be hydrated with IV normal saline and will follow her BMP.  3.  Essential hypertension. - We will continue her hydralazine.  4.  Pancreatic insufficiency. - We will continue her Creon.   DVT prophylaxis: SCDs.  Code Status: full code. Family Communication:  The plan of care was discussed in details with the patient (and family). I answered all questions. The patient agreed to proceed with the above mentioned plan. Further management will depend upon hospital course. Disposition Plan: Back to previous home environment Consults called: Gastroenterology. All the records are reviewed and case discussed with ED provider.  Status is: Inpatient   Remains inpatient appropriate because:Ongoing diagnostic testing needed not appropriate for outpatient work up, Unsafe d/c plan, IV treatments appropriate due to intensity of illness or inability to take PO, and Inpatient level of care appropriate due to severity of illness  Dispo: The patient is from: Home              Anticipated d/c is to: Home              Patient currently is not medically stable to d/c.              Difficult to place patient: No  TOTAL TIME TAKING CARE OF THIS PATIENT: 55 minutes.     Christel Mormon M.D on 06/07/2021 at 11:13 PM  [facility]   From 7 PM-7 AM, contact  night-coverage [email or web address]  CC: Primary care physician; [name], MD

## 2021-06-08 ENCOUNTER — Encounter: Payer: Self-pay | Admitting: Internal Medicine

## 2021-06-08 DIAGNOSIS — K5730 Diverticulosis of large intestine without perforation or abscess without bleeding: Secondary | ICD-10-CM | POA: Diagnosis not present

## 2021-06-08 DIAGNOSIS — K921 Melena: Secondary | ICD-10-CM | POA: Diagnosis not present

## 2021-06-08 DIAGNOSIS — K922 Gastrointestinal hemorrhage, unspecified: Secondary | ICD-10-CM | POA: Diagnosis not present

## 2021-06-08 LAB — CBC
HCT: 35.2 % — ABNORMAL LOW (ref 36.0–46.0)
Hemoglobin: 10.7 g/dL — ABNORMAL LOW (ref 12.0–15.0)
MCH: 28.1 pg (ref 26.0–34.0)
MCHC: 30.4 g/dL (ref 30.0–36.0)
MCV: 92.4 fL (ref 80.0–100.0)
Platelets: 185 10*3/uL (ref 150–400)
RBC: 3.81 MIL/uL — ABNORMAL LOW (ref 3.87–5.11)
RDW: 16 % — ABNORMAL HIGH (ref 11.5–15.5)
WBC: 6.9 10*3/uL (ref 4.0–10.5)
nRBC: 0 % (ref 0.0–0.2)

## 2021-06-08 LAB — RESP PANEL BY RT-PCR (FLU A&B, COVID) ARPGX2
Influenza A by PCR: NEGATIVE
Influenza B by PCR: NEGATIVE
SARS Coronavirus 2 by RT PCR: NEGATIVE

## 2021-06-08 LAB — BASIC METABOLIC PANEL
Anion gap: 4 — ABNORMAL LOW (ref 5–15)
BUN: 32 mg/dL — ABNORMAL HIGH (ref 8–23)
CO2: 28 mmol/L (ref 22–32)
Calcium: 8.8 mg/dL — ABNORMAL LOW (ref 8.9–10.3)
Chloride: 108 mmol/L (ref 98–111)
Creatinine, Ser: 0.84 mg/dL (ref 0.44–1.00)
GFR, Estimated: >60 mL/min (ref >60)
Glucose, Bld: 96 mg/dL (ref 70–99)
Potassium: 4.9 mmol/L (ref 3.5–5.1)
Sodium: 140 mmol/L (ref 135–145)

## 2021-06-08 LAB — HEMOGLOBIN AND HEMATOCRIT, BLOOD
HCT: 36.2 % (ref 36.0–46.0)
Hemoglobin: 11.2 g/dL — ABNORMAL LOW (ref 12.0–15.0)

## 2021-06-08 MED ORDER — HYDRALAZINE HCL 25 MG PO TABS
25.0000 mg | ORAL_TABLET | Freq: Three times a day (TID) | ORAL | Status: DC
  Administered 2021-06-08 – 2021-06-09 (×3): 25 mg via ORAL
  Filled 2021-06-08 (×3): qty 1

## 2021-06-08 MED ORDER — SODIUM CHLORIDE 0.9 % IV SOLN: INTRAVENOUS | Status: DC

## 2021-06-08 MED ORDER — PANCRELIPASE (LIP-PROT-AMYL) 36000-114000 UNITS PO CPEP
36000.0000 [IU] | ORAL_CAPSULE | Freq: Three times a day (TID) | ORAL | Status: DC
  Administered 2021-06-09 (×2): 36000 [IU] via ORAL
  Filled 2021-06-08 (×6): qty 1

## 2021-06-08 MED ORDER — ONDANSETRON HCL 4 MG PO TABS: 4.0000 mg | ORAL_TABLET | Freq: Four times a day (QID) | ORAL | Status: DC | PRN

## 2021-06-08 MED ORDER — ONDANSETRON HCL 4 MG/2ML IJ SOLN: 4.0000 mg | Freq: Four times a day (QID) | INTRAMUSCULAR | Status: DC | PRN

## 2021-06-08 MED ORDER — PANTOPRAZOLE SODIUM 40 MG IV SOLR
40.0000 mg | Freq: Two times a day (BID) | INTRAVENOUS | Status: DC
  Administered 2021-06-08 – 2021-06-09 (×3): 40 mg via INTRAVENOUS
  Filled 2021-06-08 (×3): qty 40

## 2021-06-08 MED ORDER — ACETAMINOPHEN 325 MG PO TABS: 650.0000 mg | ORAL_TABLET | Freq: Four times a day (QID) | ORAL | Status: DC | PRN

## 2021-06-08 MED ORDER — ACETAMINOPHEN 650 MG RE SUPP
650.0000 mg | Freq: Four times a day (QID) | RECTAL | Status: DC | PRN
  Filled 2021-06-08: qty 1

## 2021-06-08 MED ORDER — MAGNESIUM HYDROXIDE 400 MG/5ML PO SUSP: 30.0000 mL | Freq: Every day | ORAL | Status: DC | PRN

## 2021-06-08 MED ORDER — MECLIZINE HCL 25 MG PO TABS
12.5000 mg | ORAL_TABLET | Freq: Three times a day (TID) | ORAL | Status: DC | PRN
  Filled 2021-06-08: qty 0.5

## 2021-06-08 MED ORDER — DICYCLOMINE HCL 20 MG PO TABS
20.0000 mg | ORAL_TABLET | Freq: Three times a day (TID) | ORAL | Status: DC | PRN
  Filled 2021-06-08: qty 1

## 2021-06-08 MED ORDER — ALUM & MAG HYDROXIDE-SIMETH 200-200-20 MG/5ML PO SUSP
30.0000 mL | Freq: Four times a day (QID) | ORAL | Status: DC | PRN
  Administered 2021-06-08: 30 mL via ORAL
  Filled 2021-06-08: qty 30

## 2021-06-08 MED ORDER — TRAZODONE HCL 50 MG PO TABS: 25.0000 mg | ORAL_TABLET | Freq: Every evening | ORAL | Status: DC | PRN

## 2021-06-08 NOTE — Progress Notes (Signed)
[facility] [place] NAME: Taylor Johnson    MR#:  [id number]  DATE OF BIRTH:  May 12, 1946  SUBJECTIVE:  sister at bedside gives most of the history. Patient is enjoying her clear liquid diet. No blood he bowel movement since coming up to the room.  Patient apparently had dark colored stools according to sister mixed with clots times two at home. Patient denies any abdominal pain. No vomiting.  REVIEW OF SYSTEMS:   Review of Systems  Constitutional:  Negative for chills, fever and weight loss.  HENT:  Negative for ear discharge, ear pain and nosebleeds.   Eyes:  Negative for blurred vision, pain and discharge.  Respiratory:  Negative for sputum production, shortness of breath, wheezing and stridor.   Cardiovascular:  Negative for chest pain, palpitations, orthopnea and PND.  Gastrointestinal:  Negative for abdominal pain, diarrhea, nausea and vomiting.  Genitourinary:  Negative for frequency and urgency.  Musculoskeletal:  Negative for back pain and joint pain.  Neurological:  Negative for sensory change, speech change, focal weakness and weakness.  Psychiatric/Behavioral:  Negative for depression and hallucinations. The patient is not nervous/anxious.   Tolerating Diet:clears Tolerating PT:   DRUG ALLERGIES:  No Known Allergies  VITALS:  Blood pressure (!) 129/47, pulse 60, temperature 98.3 F (36.8 C), temperature source Oral, resp. rate 18, height 5' (1.524 m), weight 113.4 kg, SpO2 98 %.  PHYSICAL EXAMINATION:   Physical Exam  GENERAL:  75 y.o.-year-old patient lying in the bed with no acute distress. obese HEENT: Head atraumatic, normocephalic. Oropharynx and nasopharynx clear.  LUNGS: Normal breath sounds bilaterally, no wheezing, rales, rhonchi. No use of accessory muscles of respiration.  CARDIOVASCULAR: S1, S2 normal. No murmurs, rubs, or gallops.  ABDOMEN: Soft, nontender, nondistended. Bowel sounds present. No organomegaly or  mass.  EXTREMITIES: No cyanosis, clubbing or edema b/l.    NEUROLOGIC: nonfocal PSYCHIATRIC:  patient is alert and oriented x 3.  SKIN: No obvious rash, lesion, or ulcer.   LABORATORY PANEL:  CBC Recent Labs  Lab 06/08/21 0404 06/08/21 1255  WBC 6.9  --   HGB 10.7* 11.2*  HCT 35.2* 36.2  PLT 185  --     Chemistries  Recent Labs  Lab 06/07/21 1435 06/08/21 0404  NA 139 140  K 4.0 4.9  CL 104 108  CO2 29 28  GLUCOSE 103* 96  BUN 28* 32*  CREATININE 1.22* 0.84  CALCIUM 9.1 8.8*  AST 12*  --   ALT 8  --   ALKPHOS 69  --   BILITOT 0.4  --    Cardiac Enzymes No results for input(s): TROPONINI in the last 168 hours. RADIOLOGY:  CT ABDOMEN PELVIS W CONTRAST  Result Date: 06/07/2021 CLINICAL DATA:  Abdominal pain. GI bleed. Dark stools today. Lower abdominal cramping. EXAM: CT ABDOMEN AND PELVIS WITH CONTRAST TECHNIQUE: Multidetector CT imaging of the abdomen and pelvis was performed using the standard protocol following bolus administration of intravenous contrast. CONTRAST:  124mL OMNIPAQUE IOHEXOL 300 MG/ML  SOLN COMPARISON:  Most recent CT 02/05/2021 FINDINGS: Lower chest: Breathing motion artifact. Lingular scarring. Chronic cardiomegaly. Circumferential small pericardial effusion which is unchanged from prior exam. Hepatobiliary: Motion artifact through the upper abdomen. The gallbladder is distended. Limited assessment for pericholecystic fat stranding due to motion. Known gallstone is obscured by motion. Common bile duct is difficult to delineate. There is mild central intrahepatic biliary ductal dilatation. Calcified granuloma at the hepatic dome. No suspicious liver lesion.  Pancreas: Motion artifact through the pancreas. No definite peripancreatic inflammation. No pancreatic ductal dilatation. Spleen: Normal in size without focal abnormality. Adrenals/Urinary Tract: Normal adrenal glands. No hydronephrosis or perinephric edema. Homogeneous renal enhancement with symmetric  excretion on delayed phase imaging. Tiny subcentimeter hypodensity in the medial kidney, too small to characterize. Urinary bladder is physiologically distended without wall thickening. Stomach/Bowel: Motion obscures detailed bowel assessment. The stomach is unremarkable. There is a duodenal diverticulum. No obvious small bowel inflammation or obstruction. Extensive colonic diverticulosis. No diverticulitis or acute colonic inflammation. Colonic redundancy. High-riding cecum in the mid abdomen. Appendix not definitively identified. No appendicitis. Vascular/Lymphatic: Normal caliber abdominal aorta patent portal vein. No enlarged lymph nodes in the abdomen or pelvis. Reproductive: Enlarged uterus with multiple fibroids, many of which are calcified. Ovaries are not well delineated, no obvious adnexal mass. Other: No free air or free fluid.  No abdominopelvic collection. Musculoskeletal: There are no acute or suspicious osseous abnormalities. IMPRESSION: 1. Motion limited exam. 2. Distended gallbladder. Limited assessment for pericholecystic fat stranding due to motion. There is mild central intrahepatic biliary ductal dilatation. Consider further evaluation with right upper quadrant ultrasound. Gallstone demonstrated on prior exams is obscured by motion. 3. Extensive pancolonic diverticulosis without diverticulitis. 4. Enlarged fibroid uterus. 5. Chronic cardiomegaly with unchanged small pericardial effusion. Electronically Signed   By: Keith Rake M.D.   On: 06/07/2021 22:00   ASSESSMENT AND PLAN:   Taylor Johnson is a 75 y.o. female with medical history significant for GERD, hypertension, obstructive sleep apnea and previous history of GI bleeding, who presented to the ER with acute onset of bright red bleeding per rectum with associated blood clots.  She denies any nausea or vomiting or heartburn or abdominal pain.  She admitted to occasional melena  G.I. bleed appears lower vs upper -- differential  includes hemorrhoidal versus diverticular, low clinical suspicion for ischemia -- hemoglobin stable -- hemodynamically stable -- received IV fluids in the ER -- G.I. consultation with Dr. Virgina Jock. Recommends continued monitoring no G.I. evaluation planned-- he feels likely diverticular -- hemoglobin 11.2-- 10.7-- 11.2 -- patient does not have any diarrhea. No indication for stool studies at present -- colonoscopy in 2021 showedHemorrhoids found on perianal exam. - One 10 mm polyp in the ascending colon, removed with a hot snare. Resected and retrieved. - Three 3 to 6 mm polyps in the descending colon and in the ascending colon, removed with a cold snare. - Diverticulosis in the entire examined colon. - Non-bleeding internal hemorrhoids. -- EGD in 2018 within normal limits -cont PPI  Acute renal failure/prerenal -- came in with creatinine of 1.22-- 0.8 -- patient tolerating PO's  hypertension continue hydralazine  Procedures: Family communication : sister at bedside Consults : G.I. CODE STATUS: full DVT Prophylaxis : SCD Level of care: Telemetry Medical Status is: Inpatient  Remains inpatient appropriate because: rectal bleed        TOTAL TIME TAKING CARE OF THIS PATIENT: 25 minutes.  >50% time spent on counselling and coordination of care  Note: This dictation was prepared with Dragon dictation along with smaller phrase technology. Any transcriptional errors that result from this process are unintentional.  Fritzi Mandes M.D    [facility]   CC: Primary care physician; [name], MD Patient ID: Taylor Johnson, female   DOB: 1945-08-13, 75 y.o.   MRN: [id number]

## 2021-06-08 NOTE — Consult Note (Signed)
Inpatient Consultation   Patient ID: Taylor Johnson is a 75 y.o. female.  Requesting Provider: Eugenie Norrie, MD  Date of Admission: 06/07/2021  Date of Consult: 06/08/21   Reason for Consultation: hematochezia   Patient's Chief Complaint:   Chief Complaint  Patient presents with   Abdominal Pain   Rectal Bleeding    75 y/o AAF with HTN, OSA, morbid obesity, GERD, h/o GIB who presents with BRBPR. GI is consulted for hematochezia. Unfortunately, the patient is not a strong historian, but more information is garnered via chart review and discussion with nursing.  Pt noted cramping abdominal pain after eating canned poultry yesterday and proceeded to have a bloody bowel movement. This also occurred one additional time in the Emergency department. Upon evaluation, her abdominal discomfort is improved, but still present- it is vague and non-localized. Upon arrival her hgb was 11.2 from baseline btwn 12.2 and 12.8. Upon serial checks her hgb has remained unchanged. Her VS have remained stable without hypotension and tachycardia.  No n/v, hematemesis, coffee ground emesis. Pt receiving ppi gtt. Receiving NS IVF.  CT performed with motion degradation demonstrating distended gallbladder and mild IH biliary dilation- pt is asx and LFTs are normal. Pandiverticulosis is noted without diverticulitis. Fibroid uterus.  Denies NSAIDs, Anti-plt agents, and anticoagulants Denies family history of gastrointestinal disease and malignancy Previous Endoscopies:12/13/19 Colonoscopy- IH, Diverticulosis 2018- EGD Normal  Past Medical History:  Diagnosis Date   Abdominal pain 01/27/2017   Anxiety    Anxiety, generalized 05/11/2015   Cough 06/02/2016   Chronic - followed by Pulmonary   Cough 06/02/2016   Chronic - followed by Pulmonary   Depression    Developmental delay    Diverticulitis    Diverticulosis of colon 05/11/2015   Dysphagia 10/15/2015   Routine Ba Swallow normal; rec modified barium  swallow    GERD (gastroesophageal reflux disease)    Hematochezia 11/07/2019   Hypertension    Hypoxia 09/12/2015   Leg swelling    Localized edema 04/15/2016   Microscopic hematuria 04/15/2016   Nausea and vomiting 01/29/2017   Orthostatic hypotension 03/16/2017   OSA on CPAP 11/18/2015   CPAP @ 18 cm H2O started 02/2016   Rectal bleeding 10/15/2018   Urge incontinence of urine 05/12/2015    Past Surgical History:  Procedure Laterality Date   ABDOMINAL HYSTERECTOMY     BREAST BIOPSY Left    neg   COLON SURGERY     COLONOSCOPY WITH PROPOFOL N/A 12/13/2019   Procedure: COLONOSCOPY WITH PROPOFOL;  Surgeon: Lin Landsman, MD;  Location: [facility] ENDOSCOPY;  Service: Endoscopy;  Laterality: N/A;   ESOPHAGOGASTRODUODENOSCOPY (EGD) WITH PROPOFOL N/A 01/29/2017   Procedure: ESOPHAGOGASTRODUODENOSCOPY (EGD) WITH PROPOFOL;  Surgeon: Wilford Corner, MD;  Location: [facility] ENDOSCOPY;  Service: Endoscopy;  Laterality: N/A;    No Known Allergies  Family History  Problem Relation Age of Onset   Hypertension Mother    Kidney disease Mother    Dementia Mother    Cancer Father        lung   Bladder Cancer Neg Hx    Breast cancer Neg Hx     Social History   Tobacco Use   Smoking status: Never   Smokeless tobacco: Never   Tobacco comments:    smoking cessation materials not required  Vaping Use   Vaping Use: Never used  Substance Use Topics   Alcohol use: No    Alcohol/week: 0.0 standard drinks   Drug use: No  Pertinent GI related history and allergies were reviewed with the patient  Review of Systems  Constitutional:  Positive for appetite change (diminished). Negative for activity change, fatigue, fever and unexpected weight change.  HENT:  Negative for trouble swallowing and voice change.   Respiratory:  Negative for shortness of breath and wheezing.   Cardiovascular:  Negative for chest pain and palpitations.  Gastrointestinal:  Positive for abdominal pain and blood  in stool. Negative for abdominal distention, anal bleeding, constipation, diarrhea, nausea, rectal pain and vomiting.  Musculoskeletal:  Negative for arthralgias and myalgias.  Skin:  Negative for color change and pallor.  Neurological:  Negative for dizziness, syncope and weakness.  Psychiatric/Behavioral:  Negative for confusion.   All other systems reviewed and are negative.   Medications Home Medications No current facility-administered medications on file prior to encounter.   Current Outpatient Medications on File Prior to Encounter  Medication Sig Dispense Refill   CREON 36000-114000 units CPEP capsule Take 2 capsules with the first bite of each meal and 1 capsule with the first bite of each snack 240 capsule 2   dicyclomine (BENTYL) 20 MG tablet Take 1 tablet (20 mg total) by mouth every 8 (eight) hours as needed for spasms (Abdominal cramping). 15 tablet 0   hydrALAZINE (APRESOLINE) 25 MG tablet TAKE ONE TABLET BY MOUTH THREE TIMES DAILY 84 tablet 0   lipase/protease/amylase (CREON) 36000 UNITS CPEP capsule Take 2 capsules with the first bite of each meal and 1 capsule with the first bite of each snack 240 capsule 2   meclizine (ANTIVERT) 12.5 MG tablet TAKE ONE TABLET BY MOUTH THREE TIMES DAILY AS NEEDED FOR DIZZINESS 30 tablet 5   metoCLOPramide (REGLAN) 5 MG tablet TAKE ONE TABLET BY MOUTH EVERY MORNING, NOON, EVENING (Patient not taking: Reported on 05/26/2021) 84 tablet 5   Pertinent GI related medications were reviewed with the patient  Inpatient Medications  Current Facility-Administered Medications:    0.9 %  sodium chloride infusion, , Intravenous, Continuous, Mansy, Jan A, MD, Last Rate: 100 mL/hr at 06/08/21 0329, New Bag at 06/08/21 0329   acetaminophen (TYLENOL) tablet 650 mg, 650 mg, Oral, Q6H PRN **OR** acetaminophen (TYLENOL) suppository 650 mg, 650 mg, Rectal, Q6H PRN, Mansy, Jan A, MD   dicyclomine (BENTYL) tablet 20 mg, 20 mg, Oral, Q8H PRN, Mansy, Jan A, MD    hydrALAZINE (APRESOLINE) tablet 25 mg, 25 mg, Oral, TID, Mansy, Jan A, MD   lipase/protease/amylase (CREON) capsule 36,000 Units, 36,000 Units, Oral, TID WC, Mansy, Jan A, MD   magnesium hydroxide (MILK OF MAGNESIA) suspension 30 mL, 30 mL, Oral, Daily PRN, Mansy, Jan A, MD   meclizine (ANTIVERT) tablet 12.5 mg, 12.5 mg, Oral, TID PRN, Mansy, Jan A, MD   ondansetron (ZOFRAN) tablet 4 mg, 4 mg, Oral, Q6H PRN **OR** ondansetron (ZOFRAN) injection 4 mg, 4 mg, Intravenous, Q6H PRN, Mansy, Jan A, MD   [START ON 06/11/2021] pantoprazole (PROTONIX) injection 40 mg, 40 mg, Intravenous, Q12H, Nance Pear, MD   pantoprozole (PROTONIX) 80 mg /NS 100 mL infusion, 8 mg/hr, Intravenous, Continuous, Nance Pear, MD, Last Rate: 10 mL/hr at 06/08/21 0028, 8 mg/hr at 06/08/21 0028   traZODone (DESYREL) tablet 25 mg, 25 mg, Oral, QHS PRN, Mansy, Arvella Merles, MD  Current Outpatient Medications:    CREON 36000-114000 units CPEP capsule, Take 2 capsules with the first bite of each meal and 1 capsule with the first bite of each snack, Disp: 240 capsule, Rfl: 2   dicyclomine (BENTYL) 20  MG tablet, Take 1 tablet (20 mg total) by mouth every 8 (eight) hours as needed for spasms (Abdominal cramping)., Disp: 15 tablet, Rfl: 0   hydrALAZINE (APRESOLINE) 25 MG tablet, TAKE ONE TABLET BY MOUTH THREE TIMES DAILY, Disp: 84 tablet, Rfl: 0   lipase/protease/amylase (CREON) 36000 UNITS CPEP capsule, Take 2 capsules with the first bite of each meal and 1 capsule with the first bite of each snack, Disp: 240 capsule, Rfl: 2   meclizine (ANTIVERT) 12.5 MG tablet, TAKE ONE TABLET BY MOUTH THREE TIMES DAILY AS NEEDED FOR DIZZINESS, Disp: 30 tablet, Rfl: 5   metoCLOPramide (REGLAN) 5 MG tablet, TAKE ONE TABLET BY MOUTH EVERY MORNING, NOON, EVENING (Patient not taking: Reported on 05/26/2021), Disp: 84 tablet, Rfl: 5  sodium chloride 100 mL/hr at 06/08/21 0329   pantoprazole 8 mg/hr (06/08/21 0028)    acetaminophen **OR** acetaminophen,  dicyclomine, magnesium hydroxide, meclizine, ondansetron **OR** ondansetron (ZOFRAN) IV, traZODone   Objective   Vitals:   06/07/21 2200 06/08/21 0000 06/08/21 0330 06/08/21 0400  BP: (!) 163/73 (!) 165/71 (!) 125/51 (!) 127/50  Pulse: 73 72 63 60  Resp: 17 20  18   Temp:      TempSrc:      SpO2: 99% 100% 98% 97%  Weight:      Height:         Physical Exam Vitals and nursing note reviewed.  Constitutional:      General: She is in acute distress (mild).     Appearance: Normal appearance. She is obese. She is ill-appearing (chronically). She is not toxic-appearing or diaphoretic.  HENT:     Head: Normocephalic and atraumatic.     Nose: Nose normal.     Mouth/Throat:     Mouth: Mucous membranes are moist.     Pharynx: Oropharynx is clear.  Eyes:     General: No scleral icterus.    Extraocular Movements: Extraocular movements intact.  Cardiovascular:     Rate and Rhythm: Normal rate and regular rhythm.     Heart sounds: Murmur heard.  Pulmonary:     Effort: No respiratory distress.     Breath sounds: No wheezing, rhonchi or rales.     Comments: Diminished breath sounds bilaterally Abdominal:     General: Bowel sounds are normal. There is no distension.     Palpations: Abdomen is soft.     Tenderness: There is abdominal tenderness (mild; non peritoneal. no rigidity). There is no guarding or rebound.  Musculoskeletal:     Cervical back: Neck supple.     Right lower leg: No edema.     Left lower leg: No edema.  Skin:    General: Skin is warm and dry.     Coloration: Skin is not jaundiced or pale.  Neurological:     General: No focal deficit present.     Mental Status: She is alert and oriented to person, place, and time. Mental status is at baseline.  Psychiatric:        Mood and Affect: Mood normal.        Behavior: Behavior normal.        Thought Content: Thought content normal.        Judgment: Judgment normal.    Laboratory Data Recent Labs  Lab 06/07/21 1435  06/08/21 0404  WBC 6.9 6.9  HGB 11.2* 10.7*  HCT 37.7 35.2*  PLT 197 185   Recent Labs  Lab 06/07/21 1435 06/08/21 0404  NA 139 140  K 4.0  4.9  CL 104 108  CO2 29 28  BUN 28* 32*  CALCIUM 9.1 8.8*  PROT 7.3  --   BILITOT 0.4  --   ALKPHOS 69  --   ALT 8  --   AST 12*  --   GLUCOSE 103* 96   No results for input(s): INR in the last 168 hours.  Recent Labs    06/07/21 1435  LIPASE 21        Imaging Studies: CT ABDOMEN PELVIS W CONTRAST  Result Date: 06/07/2021 CLINICAL DATA:  Abdominal pain. GI bleed. Dark stools today. Lower abdominal cramping. EXAM: CT ABDOMEN AND PELVIS WITH CONTRAST TECHNIQUE: Multidetector CT imaging of the abdomen and pelvis was performed using the standard protocol following bolus administration of intravenous contrast. CONTRAST:  162mL OMNIPAQUE IOHEXOL 300 MG/ML  SOLN COMPARISON:  Most recent CT 02/05/2021 FINDINGS: Lower chest: Breathing motion artifact. Lingular scarring. Chronic cardiomegaly. Circumferential small pericardial effusion which is unchanged from prior exam. Hepatobiliary: Motion artifact through the upper abdomen. The gallbladder is distended. Limited assessment for pericholecystic fat stranding due to motion. Known gallstone is obscured by motion. Common bile duct is difficult to delineate. There is mild central intrahepatic biliary ductal dilatation. Calcified granuloma at the hepatic dome. No suspicious liver lesion. Pancreas: Motion artifact through the pancreas. No definite peripancreatic inflammation. No pancreatic ductal dilatation. Spleen: Normal in size without focal abnormality. Adrenals/Urinary Tract: Normal adrenal glands. No hydronephrosis or perinephric edema. Homogeneous renal enhancement with symmetric excretion on delayed phase imaging. Tiny subcentimeter hypodensity in the medial kidney, too small to characterize. Urinary bladder is physiologically distended without wall thickening. Stomach/Bowel: Motion obscures detailed  bowel assessment. The stomach is unremarkable. There is a duodenal diverticulum. No obvious small bowel inflammation or obstruction. Extensive colonic diverticulosis. No diverticulitis or acute colonic inflammation. Colonic redundancy. High-riding cecum in the mid abdomen. Appendix not definitively identified. No appendicitis. Vascular/Lymphatic: Normal caliber abdominal aorta patent portal vein. No enlarged lymph nodes in the abdomen or pelvis. Reproductive: Enlarged uterus with multiple fibroids, many of which are calcified. Ovaries are not well delineated, no obvious adnexal mass. Other: No free air or free fluid.  No abdominopelvic collection. Musculoskeletal: There are no acute or suspicious osseous abnormalities. IMPRESSION: 1. Motion limited exam. 2. Distended gallbladder. Limited assessment for pericholecystic fat stranding due to motion. There is mild central intrahepatic biliary ductal dilatation. Consider further evaluation with right upper quadrant ultrasound. Gallstone demonstrated on prior exams is obscured by motion. 3. Extensive pancolonic diverticulosis without diverticulitis. 4. Enlarged fibroid uterus. 5. Chronic cardiomegaly with unchanged small pericardial effusion. Electronically Signed   By: Keith Rake M.D.   On: 06/07/2021 22:00    Assessment:   # Hematochezia - phx diverticulosis and internal hemorrhoids - Likely LGIB source given presentation and hx- ddx includes diverticular bleed vs hemorrhoidal vs less likely colonic ischemia. CT negative for signs of ischemia  - given vss and bloody BM - much less likely UGIB - p/w hgb 11.2; baseline 12.2-12.8 - VSS - no oac/antiplt or nsaid use - normal stomach on CT, duodenal diverticulum - pt with potential food poisoning eating canned chicken as this prompted sx onset  # CT - Biliary dilation and GB dilation - LFTs wnl; patient is asx - motion degradation in multiple imaging modalities over time  # morbid obesity # OSA #  GERD # HTN  Plan:  Infectious stool w/u to r/o infectious etiology of bloody diarrhea Supportive care- ivf, anti emetics No current plan for endoscopy  as she had colonoscopy not too long ago and this is likely diverticular which is self-resolving, if not infectious Can decrease ppi gtt to iv bid 40 mg Ok for diet from gi perspective Monitor H&H no more than q8h; transfusion and resuscitation as per primary team Pt currently asx from biliary perspective- no further w/u at this time If hemodynamic instability occurs, recommend stat CTA for bleed localization Discussed case with nursing and primary team  I personally performed the service.  Management of other medical comorbidities as per primary team  Thank you for allowing [name] to participate in this patient's care. Please don't hesitate to call if any questions or concerns arise.   Annamaria Helling, DO [facility]  Portions of the record may have been created with voice recognition software. Occasional wrong-word or 'sound-a-like' substitutions may have occurred due to the inherent limitations of voice recognition software.  Read the chart carefully and recognize, using context, where substitutions may have occurred.

## 2021-06-08 NOTE — ED Notes (Signed)
Informed RN bed assigned 

## 2021-06-08 NOTE — ED Notes (Signed)
Pt changed over into a hospital bed before transitioning to [facility] room. Family at bedside leaving and informed of pts need to stay in ED throughout night/morning due to hospital capacity.

## 2021-06-09 DIAGNOSIS — K922 Gastrointestinal hemorrhage, unspecified: Secondary | ICD-10-CM | POA: Diagnosis not present

## 2021-06-09 DIAGNOSIS — K921 Melena: Secondary | ICD-10-CM | POA: Diagnosis not present

## 2021-06-09 LAB — HEMOGLOBIN AND HEMATOCRIT, BLOOD
HCT: 33.3 % — ABNORMAL LOW (ref 36.0–46.0)
Hemoglobin: 10.3 g/dL — ABNORMAL LOW (ref 12.0–15.0)

## 2021-06-09 NOTE — Plan of Care (Signed)
Patient tolerated regular diet without issue prior to leaving. No bloody stool noted with BM.

## 2021-06-09 NOTE — Progress Notes (Signed)
Inpatient Follow-up/Progress Note   Patient ID: Taylor Johnson is a 75 y.o. female.  Overnight Events / Subjective Findings NAEON. Bloody BM have stopped. Hgb 10.3 this morning. VSS. Likely equilibration from initial loss and ivf received. Would like to advance diet.  Abdominal pain has resolved. No other acute gi complaints.  Review of Systems  Constitutional:  Negative for activity change, appetite change, fatigue, fever and unexpected weight change.  HENT:  Negative for trouble swallowing and voice change.   Respiratory:  Negative for shortness of breath and wheezing.   Cardiovascular:  Negative for chest pain and palpitations.  Gastrointestinal:  Positive for blood in stool (resolving). Negative for abdominal distention, abdominal pain, anal bleeding, constipation, diarrhea, nausea, rectal pain and vomiting.  Musculoskeletal:  Negative for arthralgias and myalgias.  Skin:  Negative for color change and pallor.  Neurological:  Negative for dizziness, syncope and weakness.  Psychiatric/Behavioral:  Negative for confusion.   All other systems reviewed and are negative.   Medications  Current Facility-Administered Medications:    acetaminophen (TYLENOL) tablet 650 mg, 650 mg, Oral, Q6H PRN **OR** acetaminophen (TYLENOL) suppository 650 mg, 650 mg, Rectal, Q6H PRN, Mansy, Jan A, MD   alum & mag hydroxide-simeth (MAALOX/MYLANTA) 200-200-20 MG/5ML suspension 30 mL, 30 mL, Oral, Q6H PRN, Fritzi Mandes, MD, 30 mL at 06/08/21 1656   dicyclomine (BENTYL) tablet 20 mg, 20 mg, Oral, Q8H PRN, Mansy, Jan A, MD   hydrALAZINE (APRESOLINE) tablet 25 mg, 25 mg, Oral, TID, Mansy, Jan A, MD, 25 mg at 06/08/21 1656   lipase/protease/amylase (CREON) capsule 36,000 Units, 36,000 Units, Oral, TID WC, Mansy, Jan A, MD   magnesium hydroxide (MILK OF MAGNESIA) suspension 30 mL, 30 mL, Oral, Daily PRN, Mansy, Jan A, MD   meclizine (ANTIVERT) tablet 12.5 mg, 12.5 mg, Oral, TID PRN, Mansy, Jan A, MD   ondansetron  (ZOFRAN) tablet 4 mg, 4 mg, Oral, Q6H PRN **OR** ondansetron (ZOFRAN) injection 4 mg, 4 mg, Intravenous, Q6H PRN, Mansy, Jan A, MD   pantoprazole (PROTONIX) injection 40 mg, 40 mg, Intravenous, BID, Annamaria Helling, DO, 40 mg at 06/08/21 2221   traZODone (DESYREL) tablet 25 mg, 25 mg, Oral, QHS PRN, Mansy, Jan A, MD   acetaminophen **OR** acetaminophen, alum & mag hydroxide-simeth, dicyclomine, magnesium hydroxide, meclizine, ondansetron **OR** ondansetron (ZOFRAN) IV, traZODone   Objective    Vitals:   06/08/21 1119 06/08/21 1237 06/08/21 2107 06/09/21 0538  BP: (!) 112/44 (!) 129/47 (!) 123/51 (!) 129/50  Pulse: 63 60 63 67  Resp: 18  16 18   Temp: 98 F (36.7 C) 98.3 F (36.8 C) 98.4 F (36.9 C) 97.7 F (36.5 C)  TempSrc:  Oral Oral Oral  SpO2: 97% 98% 97% 97%  Weight:      Height:         Physical Exam Vitals and nursing note reviewed.  Constitutional:      General: She is not in acute distress.    Appearance: Normal appearance. She is obese. She is ill-appearing (chronically). She is not toxic-appearing or diaphoretic.  HENT:     Head: Normocephalic and atraumatic.     Nose: Nose normal.     Mouth/Throat:     Mouth: Mucous membranes are moist.     Pharynx: Oropharynx is clear.  Eyes:     General: No scleral icterus.    Extraocular Movements: Extraocular movements intact.  Cardiovascular:     Rate and Rhythm: Normal rate and regular rhythm.  Heart sounds: Murmur heard.    No friction rub. No gallop.  Pulmonary:     Effort: Pulmonary effort is normal. No respiratory distress.     Breath sounds: Normal breath sounds. No wheezing, rhonchi or rales.  Abdominal:     General: Bowel sounds are normal. There is no distension.     Palpations: Abdomen is soft.     Tenderness: There is no abdominal tenderness. There is no guarding or rebound.  Musculoskeletal:     Cervical back: Neck supple.     Right lower leg: No edema.     Left lower leg: No edema.  Skin:     General: Skin is warm and dry.     Coloration: Skin is not jaundiced or pale.  Neurological:     General: No focal deficit present.     Mental Status: She is alert and oriented to person, place, and time. Mental status is at baseline.  Psychiatric:        Behavior: Behavior normal.        Thought Content: Thought content normal.        Judgment: Judgment normal.     Laboratory Data Recent Labs  Lab 06/07/21 1435 06/08/21 0404 06/08/21 1255 06/09/21 0722  WBC 6.9 6.9  --   --   HGB 11.2* 10.7* 11.2* 10.3*  HCT 37.7 35.2* 36.2 33.3*  PLT 197 185  --   --    Recent Labs  Lab 06/07/21 1435 06/08/21 0404  NA 139 140  K 4.0 4.9  CL 104 108  CO2 29 28  BUN 28* 32*  CREATININE 1.22* 0.84  CALCIUM 9.1 8.8*  PROT 7.3  --   BILITOT 0.4  --   ALKPHOS 69  --   ALT 8  --   AST 12*  --   GLUCOSE 103* 96   No results for input(s): INR in the last 168 hours.    Imaging Studies: CT ABDOMEN PELVIS W CONTRAST  Result Date: 06/07/2021 CLINICAL DATA:  Abdominal pain. GI bleed. Dark stools today. Lower abdominal cramping. EXAM: CT ABDOMEN AND PELVIS WITH CONTRAST TECHNIQUE: Multidetector CT imaging of the abdomen and pelvis was performed using the standard protocol following bolus administration of intravenous contrast. CONTRAST:  121mL OMNIPAQUE IOHEXOL 300 MG/ML  SOLN COMPARISON:  Most recent CT 02/05/2021 FINDINGS: Lower chest: Breathing motion artifact. Lingular scarring. Chronic cardiomegaly. Circumferential small pericardial effusion which is unchanged from prior exam. Hepatobiliary: Motion artifact through the upper abdomen. The gallbladder is distended. Limited assessment for pericholecystic fat stranding due to motion. Known gallstone is obscured by motion. Common bile duct is difficult to delineate. There is mild central intrahepatic biliary ductal dilatation. Calcified granuloma at the hepatic dome. No suspicious liver lesion. Pancreas: Motion artifact through the pancreas. No  definite peripancreatic inflammation. No pancreatic ductal dilatation. Spleen: Normal in size without focal abnormality. Adrenals/Urinary Tract: Normal adrenal glands. No hydronephrosis or perinephric edema. Homogeneous renal enhancement with symmetric excretion on delayed phase imaging. Tiny subcentimeter hypodensity in the medial kidney, too small to characterize. Urinary bladder is physiologically distended without wall thickening. Stomach/Bowel: Motion obscures detailed bowel assessment. The stomach is unremarkable. There is a duodenal diverticulum. No obvious small bowel inflammation or obstruction. Extensive colonic diverticulosis. No diverticulitis or acute colonic inflammation. Colonic redundancy. High-riding cecum in the mid abdomen. Appendix not definitively identified. No appendicitis. Vascular/Lymphatic: Normal caliber abdominal aorta patent portal vein. No enlarged lymph nodes in the abdomen or pelvis. Reproductive: Enlarged uterus with multiple fibroids,  many of which are calcified. Ovaries are not well delineated, no obvious adnexal mass. Other: No free air or free fluid.  No abdominopelvic collection. Musculoskeletal: There are no acute or suspicious osseous abnormalities. IMPRESSION: 1. Motion limited exam. 2. Distended gallbladder. Limited assessment for pericholecystic fat stranding due to motion. There is mild central intrahepatic biliary ductal dilatation. Consider further evaluation with right upper quadrant ultrasound. Gallstone demonstrated on prior exams is obscured by motion. 3. Extensive pancolonic diverticulosis without diverticulitis. 4. Enlarged fibroid uterus. 5. Chronic cardiomegaly with unchanged small pericardial effusion. Electronically Signed   By: Keith Rake M.D.   On: 06/07/2021 22:00    Assessment:   # Hematochezia - bloody BM have stopped- 06/09/21 - phx diverticulosis and internal hemorrhoids - Likely LGIB source given presentation and hx- ddx includes  diverticular bleed vs hemorrhoidal vs less likely colonic ischemia. CT negative for signs of ischemia             - given vss and bloody BM - much less likely UGIB - p/w hgb 11.2; baseline 12.2-12.8; 11/23 hgb 10.3 - VSS - no oac/antiplt or nsaid use - normal stomach on CT, duodenal diverticulum - pt with potential food poisoning eating canned chicken as this prompted sx onset   # CT - Biliary dilation and GB dilation - LFTs wnl; patient is asx - motion degradation in multiple imaging modalities over time   # morbid obesity # OSA # GERD # HTN    Plan:  No further episodes since yesterday of bloody bm. Expect old blood to pass for 48 hours Hgb relatively stable at 10.3- equilibrating from initial loss and ivf given Supportive care- ivf, anti emetics No current plan for endoscopy as she had colonoscopy not too long ago and this is likely diverticular which is self-resolving, if not infectious Can transition ppi to home dose oral Ok to advance diet from gi perspective if no further bleeding Monitor H&H no more than q8h; transfusion and resuscitation as per primary team Pt currently asx from biliary perspective- no further w/u at this time If hemodynamic instability occurs, recommend stat CTA for bleed localization  GI to sign off. Available as needed. Please do not hesitate to call regarding questions or concerns.  I personally performed the service.  Management of other medical comorbidities as per primary team  Thank you for allowing [name] to participate in this patient's care. Please don't hesitate to call if any questions or concerns arise.   Annamaria Helling, DO [facility]  Portions of the record may have been created with voice recognition software. Occasional wrong-word or 'sound-a-like' substitutions may have occurred due to the inherent limitations of voice recognition software.  Read the chart carefully and recognize, using context, where substitutions  may have occurred.

## 2021-06-09 NOTE — TOC Initial Note (Signed)
Transition of Care [facility]) - Initial/Assessment Note    Patient Details  Name: Taylor Johnson MRN: [id number] Date of Birth: June 20, 1946  Transition of Care [facility]) CM/SW Contact:    Anselm Pancoast, RN Phone Number: 06/09/2021, 9:54 AM  Clinical Narrative:                 Spoke with sister Callie to deliver Code 35. Confirmed patient lives with her niece who is her primary caregiver along with strong family support. Family will provide transportation to home once discharged.         Patient Goals and CMS Choice        Expected Discharge Plan and Services           Expected Discharge Date: 06/09/21                                    Prior Living Arrangements/Services                       Activities of Daily Living Home Assistive Devices/Equipment: [name] (specify quad or straight), CPAP ADL Screening (condition at time of admission) Patient's cognitive ability adequate to safely complete daily activities?: Yes Is the patient deaf or have difficulty hearing?: No Does the patient have difficulty seeing, even when wearing glasses/contacts?: No Does the patient have difficulty concentrating, remembering, or making decisions?: Yes Patient able to express need for assistance with ADLs?: Yes Does the patient have difficulty dressing or bathing?: No Independently performs ADLs?: Yes (appropriate for developmental age) Does the patient have difficulty walking or climbing stairs?: Yes Weakness of Legs: Both Weakness of Arms/Hands: None  Permission Sought/Granted                  Emotional Assessment              Admission diagnosis:  GI bleeding [K92.2] Gastrointestinal hemorrhage, unspecified gastrointestinal hemorrhage type [K92.2] Patient Active Problem List   Diagnosis Date Noted   GI bleeding 06/07/2021   CPAP use counseling 08/10/2020   Morbid obesity ([place]) 08/10/2020   Abdominal pain 11/07/2019   GERD (gastroesophageal reflux disease)  11/07/2019   Dizziness 02/04/2019   Benign essential HTN 10/23/2018   Functional systolic murmur 81/07/7508   Developmental delay disorder 02/06/2017   Calculus of gallbladder without cholecystitis without obstruction 01/20/2017   Localized edema 04/15/2016   Microscopic hematuria 04/15/2016   OSA on CPAP 11/18/2015   Urge incontinence of urine 05/12/2015   Diverticulosis of colon 05/11/2015   Anxiety, generalized 05/11/2015   PCP:  [name], MD Pharmacy:   [facility], [place], [place] [address] [place] [address] Phone: [phone] Fax: [place], [place] [address] [place] [address] Phone: [phone] Fax: [phone]     Social Determinants of Health (SDOH) Interventions    Readmission Risk Interventions No flowsheet data found.

## 2021-06-09 NOTE — Care Management Obs Status (Signed)
[place] NOTIFICATION   Patient Details  Name: Taylor Johnson MRN: [id number] Date of Birth: December 31, 1945   Medicare Observation Status Notification Given:  Yes    Anselm Pancoast, RN 06/09/2021, 9:53 AM

## 2021-06-09 NOTE — Care Management CC44 (Signed)
Condition Code 44 Documentation Completed  Patient Details  Name: Taylor Johnson MRN: [id number] Date of Birth: [birth year]   Condition Code 44 given:  Yes Patient signature on Condition Code 44 notice:  Yes Documentation of 2 MD's agreement:  Yes Code 44 added to claim:  Yes    Anselm Pancoast, RN 06/09/2021, 9:53 AM

## 2021-06-09 NOTE — Progress Notes (Signed)
Patient independently ambulated to restroom with stand by   assist. Patient tolereated well.

## 2021-06-09 NOTE — Discharge Summary (Signed)
Physician Discharge Summary   Taylor Johnson  female DOB: 05-30-1946  QQV:[id number]  PCP: [name], MD  Admit date: 06/07/2021 Discharge date: 06/09/2021  Admitted From: home Disposition:  home CODE STATUS: Full code  Discharge Instructions     Discharge instructions   Complete by: As directed    GI doctor thinks your bloody bowel movement is from diverticulosis, and the bleeding appeared to have stopped now, so you can go home.   [facility] Course:  For full details, please see H&P, progress notes, consult notes and ancillary notes.  Briefly,  Taylor Johnson is a 75 y.o. female with medical history significant for hypertension, obstructive sleep apnea and previous history of GI bleeding, who presented to the ER with acute onset of bright red bleeding per rectum with associated blood clots.     Hematochezia 2/2 diverticular bleed Hx of diverticulosis and internal hemorrhoids --GI consulted, no plan for endoscopy as pt had colonoscopy not too long ago and this is likely diverticular bleed which did resolve prior to discharge. --Hgb remained stable around 10-11.  AKI --Cr 1.22 on presentation.  Improved to 0.8 the next day.   hypertension continue hydralazine  # morbid obesity, BMI 48   Discharge Diagnoses:  Principal Problem:   GI bleeding   30 Day Unplanned Readmission Risk Score    Flowsheet Row ED to Hosp-Admission (Current) from 06/07/2021 in [place]  30 Day Unplanned Readmission Risk Score (%) 15.64 Filed at 06/08/2021 1600       This score is the patient's risk of an unplanned readmission within 30 days of being discharged (0 -100%). The score is based on dignosis, age, lab data, medications, orders, and past utilization.   Low:  0-14.9   Medium: 15-21.9   High: 22-29.9   Extreme: 30 and above         Discharge Instructions:  Allergies as of 06/09/2021   No Known Allergies       Medication List     TAKE these medications    dicyclomine 20 MG tablet Commonly known as: BENTYL Take 1 tablet (20 mg total) by mouth every 8 (eight) hours as needed for spasms (Abdominal cramping).   hydrALAZINE 25 MG tablet Commonly known as: APRESOLINE TAKE ONE TABLET BY MOUTH THREE TIMES DAILY   lipase/protease/amylase 36000 UNITS Cpep capsule Commonly known as: Creon Take 2 capsules with the first bite of each meal and 1 capsule with the first bite of each snack   meclizine 12.5 MG tablet Commonly known as: ANTIVERT TAKE ONE TABLET BY MOUTH THREE TIMES DAILY AS NEEDED FOR DIZZINESS   metoCLOPramide 5 MG tablet Commonly known as: REGLAN TAKE ONE TABLET BY MOUTH EVERY MORNING, NOON, EVENING         Follow-up Information     [name], MD Follow up in 1 week(s).   Specialty: Internal Medicine Contact information: [address] [place] [address] [phone]                 No Known Allergies   The results of significant diagnostics from this hospitalization (including imaging, microbiology, ancillary and laboratory) are listed below for reference.   Consultations:   Procedures/Studies: CT ABDOMEN PELVIS W CONTRAST  Result Date: 06/07/2021 CLINICAL DATA:  Abdominal pain. GI bleed. Dark stools today. Lower abdominal cramping. EXAM: CT ABDOMEN AND PELVIS WITH CONTRAST TECHNIQUE: Multidetector CT imaging of  the abdomen and pelvis was performed using the standard protocol following bolus administration of intravenous contrast. CONTRAST:  150mL OMNIPAQUE IOHEXOL 300 MG/ML  SOLN COMPARISON:  Most recent CT 02/05/2021 FINDINGS: Lower chest: Breathing motion artifact. Lingular scarring. Chronic cardiomegaly. Circumferential small pericardial effusion which is unchanged from prior exam. Hepatobiliary: Motion artifact through the upper abdomen. The gallbladder is distended. Limited assessment for pericholecystic fat stranding due to motion.  Known gallstone is obscured by motion. Common bile duct is difficult to delineate. There is mild central intrahepatic biliary ductal dilatation. Calcified granuloma at the hepatic dome. No suspicious liver lesion. Pancreas: Motion artifact through the pancreas. No definite peripancreatic inflammation. No pancreatic ductal dilatation. Spleen: Normal in size without focal abnormality. Adrenals/Urinary Tract: Normal adrenal glands. No hydronephrosis or perinephric edema. Homogeneous renal enhancement with symmetric excretion on delayed phase imaging. Tiny subcentimeter hypodensity in the medial kidney, too small to characterize. Urinary bladder is physiologically distended without wall thickening. Stomach/Bowel: Motion obscures detailed bowel assessment. The stomach is unremarkable. There is a duodenal diverticulum. No obvious small bowel inflammation or obstruction. Extensive colonic diverticulosis. No diverticulitis or acute colonic inflammation. Colonic redundancy. High-riding cecum in the mid abdomen. Appendix not definitively identified. No appendicitis. Vascular/Lymphatic: Normal caliber abdominal aorta patent portal vein. No enlarged lymph nodes in the abdomen or pelvis. Reproductive: Enlarged uterus with multiple fibroids, many of which are calcified. Ovaries are not well delineated, no obvious adnexal mass. Other: No free air or free fluid.  No abdominopelvic collection. Musculoskeletal: There are no acute or suspicious osseous abnormalities. IMPRESSION: 1. Motion limited exam. 2. Distended gallbladder. Limited assessment for pericholecystic fat stranding due to motion. There is mild central intrahepatic biliary ductal dilatation. Consider further evaluation with right upper quadrant ultrasound. Gallstone demonstrated on prior exams is obscured by motion. 3. Extensive pancolonic diverticulosis without diverticulitis. 4. Enlarged fibroid uterus. 5. Chronic cardiomegaly with unchanged small pericardial effusion.  Electronically Signed   By: Keith Rake M.D.   On: 06/07/2021 22:00      Labs: BNP (last 3 results) No results for input(s): BNP in the last 8760 hours. Basic Metabolic Panel: Recent Labs  Lab 06/07/21 1435 06/08/21 0404  NA 139 140  K 4.0 4.9  CL 104 108  CO2 29 28  GLUCOSE 103* 96  BUN 28* 32*  CREATININE 1.22* 0.84  CALCIUM 9.1 8.8*   Liver Function Tests: Recent Labs  Lab 06/07/21 1435  AST 12*  ALT 8  ALKPHOS 69  BILITOT 0.4  PROT 7.3  ALBUMIN 3.7   Recent Labs  Lab 06/07/21 1435  LIPASE 21   No results for input(s): AMMONIA in the last 168 hours. CBC: Recent Labs  Lab 06/07/21 1435 06/08/21 0404 06/08/21 1255 06/09/21 0722  WBC 6.9 6.9  --   --   HGB 11.2* 10.7* 11.2* 10.3*  HCT 37.7 35.2* 36.2 33.3*  MCV 93.5 92.4  --   --   PLT 197 185  --   --    Cardiac Enzymes: No results for input(s): CKTOTAL, CKMB, CKMBINDEX, TROPONINI in the last 168 hours. BNP: Invalid input(s): POCBNP CBG: No results for input(s): GLUCAP in the last 168 hours. D-Dimer No results for input(s): DDIMER in the last 72 hours. Hgb A1c No results for input(s): HGBA1C in the last 72 hours. Lipid Profile No results for input(s): CHOL, HDL, LDLCALC, TRIG, CHOLHDL, LDLDIRECT in the last 72 hours. Thyroid function studies No results for input(s): TSH, T4TOTAL, T3FREE, THYROIDAB in the last 72 hours.  Invalid  input(s): FREET3 Anemia work up No results for input(s): VITAMINB12, FOLATE, FERRITIN, TIBC, IRON, RETICCTPCT in the last 72 hours. Urinalysis    Component Value Date/Time   COLORURINE STRAW (A) 02/05/2021 0214   APPEARANCEUR CLEAR (A) 02/05/2021 0214   APPEARANCEUR Clear 04/05/2013 0848   LABSPEC 1.010 02/05/2021 0214   LABSPEC 1.010 04/05/2013 0848   PHURINE 6.0 02/05/2021 0214   GLUCOSEU NEGATIVE 02/05/2021 0214   GLUCOSEU Negative 04/05/2013 0848   HGBUR NEGATIVE 02/05/2021 0214   BILIRUBINUR NEGATIVE 02/05/2021 0214   BILIRUBINUR neg 04/15/2016 1647    BILIRUBINUR Negative 04/05/2013 0848   KETONESUR NEGATIVE 02/05/2021 0214   PROTEINUR NEGATIVE 02/05/2021 0214   UROBILINOGEN 0.2 04/15/2016 1647   NITRITE NEGATIVE 02/05/2021 0214   LEUKOCYTESUR NEGATIVE 02/05/2021 0214   LEUKOCYTESUR Negative 04/05/2013 0848   Sepsis Labs Invalid input(s): PROCALCITONIN,  WBC,  LACTICIDVEN Microbiology Recent Results (from the past 240 hour(s))  Resp Panel by RT-PCR (Flu A&B, Covid) Nasopharyngeal Swab     Status: None   Collection Time: 06/08/21 11:32 AM   Specimen: Nasopharyngeal Swab; Nasopharyngeal(NP) swabs in vial transport medium  Result Value Ref Range Status   SARS Coronavirus 2 by RT PCR NEGATIVE NEGATIVE Final    Comment: (NOTE) SARS-CoV-2 target nucleic acids are NOT DETECTED.  The SARS-CoV-2 RNA is generally detectable in upper respiratory specimens during the acute phase of infection. The lowest concentration of SARS-CoV-2 viral copies this assay can detect is 138 copies/mL. A negative result does not preclude SARS-Cov-2 infection and should not be used as the sole basis for treatment or other patient management decisions. A negative result may occur with  improper specimen collection/handling, submission of specimen other than nasopharyngeal swab, presence of viral mutation(s) within the areas targeted by this assay, and inadequate number of viral copies(<138 copies/mL). A negative result must be combined with clinical observations, patient history, and epidemiological information. The expected result is Negative.  Fact Sheet for Patients:  EntrepreneurPulse.com.au  Fact Sheet for Healthcare Providers:  IncredibleEmployment.be  This test is no t yet approved or cleared by the [place] FDA and  has been authorized for detection and/or diagnosis of SARS-CoV-2 by FDA under an Emergency Use Authorization (EUA). This EUA will remain  in effect (meaning this test can be used) for the  duration of the COVID-19 declaration under Section 564(b)(1) of the Act, 21 U.S.C.section 360bbb-3(b)(1), unless the authorization is terminated  or revoked sooner.       Influenza A by PCR NEGATIVE NEGATIVE Final   Influenza B by PCR NEGATIVE NEGATIVE Final    Comment: (NOTE) The Xpert Xpress SARS-CoV-2/FLU/RSV plus assay is intended as an aid in the diagnosis of influenza from Nasopharyngeal swab specimens and should not be used as a sole basis for treatment. Nasal washings and aspirates are unacceptable for Xpert Xpress SARS-CoV-2/FLU/RSV testing.  Fact Sheet for Patients: EntrepreneurPulse.com.au  Fact Sheet for Healthcare Providers: IncredibleEmployment.be  This test is not yet approved or cleared by the [place] FDA and has been authorized for detection and/or diagnosis of SARS-CoV-2 by FDA under an Emergency Use Authorization (EUA). This EUA will remain in effect (meaning this test can be used) for the duration of the COVID-19 declaration under Section 564(b)(1) of the Act, 21 U.S.C. section 360bbb-3(b)(1), unless the authorization is terminated or revoked.  Performed at [facility], [address] [place], [place] [address]      Total time spend on discharging this patient, including the last patient exam, discussing the hospital  stay, instructions for ongoing care as it relates to all pertinent caregivers, as well as preparing the medical discharge records, prescriptions, and/or referrals as applicable, is 35 minutes.    Enzo Bi, MD  [facility] 06/09/2021, 9:31 AM

## 2021-06-09 NOTE — Plan of Care (Signed)
Patient discharged tohome with family. Discharge instructions reviewed with patient who verbalized understanding. PIV and telemetry removed prior to discharge.  Awaiting family transport who states will pick up at 1pm.

## 2021-06-14 ENCOUNTER — Telehealth: Payer: Self-pay

## 2021-06-14 NOTE — Telephone Encounter (Signed)
Transition Care Management Follow-up Telephone Call Date of discharge and from where: 06/09/21 How have you been since you were released from the hospital? Pt states she is doing okay Any questions or concerns? No  TCM CALL completed today due to office closed in observance of Thanksgiving holiday Thursday & Friday 06/10/21-06/11/21  Items Reviewed: Did the pt receive and understand the discharge instructions provided? Yes  Medications obtained and verified? Yes  Other? No  Any new allergies since your discharge? No  Dietary orders reviewed? Yes Do you have support at home? Yes   Home Care and Equipment/Supplies: Were home health services ordered? no  Were any new equipment or medical supplies ordered?  No  Functional Questionnaire: (I = Independent and D = Dependent) ADLs: I  Bathing/Dressing- I  Meal Prep- I   Eating- I  Maintaining continence- I wears pads/depends  Transferring/Ambulation- I  Managing Meds- I with assistance  Follow up appointments reviewed:  PCP Hospital f/u appt confirmed? Yes  Scheduled to see Dr. Army Melia on 06/15/21 @ 2:00 [facility] f/u appt confirmed? Yes  Scheduled to see Dr. Marius Ditch on 06/17/21 @ 2:15. Are transportation arrangements needed? No  If their condition worsens, is the pt aware to call PCP or go to the Emergency Dept.? Yes Was the patient provided with contact information for the PCP's office or ED? Yes Was to pt encouraged to call back with questions or concerns? Yes

## 2021-06-15 ENCOUNTER — Other Ambulatory Visit: Payer: Self-pay

## 2021-06-15 ENCOUNTER — Ambulatory Visit (INDEPENDENT_AMBULATORY_CARE_PROVIDER_SITE_OTHER): Payer: Medicare Other | Admitting: Internal Medicine

## 2021-06-15 ENCOUNTER — Encounter: Payer: Self-pay | Admitting: Internal Medicine

## 2021-06-15 VITALS — BP 138/76 | HR 54 | Temp 98.2°F | Ht 60.0 in | Wt 241.0 lb

## 2021-06-15 DIAGNOSIS — I10 Essential (primary) hypertension: Secondary | ICD-10-CM | POA: Diagnosis not present

## 2021-06-15 DIAGNOSIS — K922 Gastrointestinal hemorrhage, unspecified: Secondary | ICD-10-CM

## 2021-06-15 NOTE — Progress Notes (Signed)
Date:  06/15/2021   Name:  Taylor Johnson   DOB:  1945-11-18   MRN:  [id number]   Chief Complaint: Hospitalization Follow-up (Pt is not bleeding anymore ) Hospital follow up.  Admitted to [facility] with diverticular bleeding from 06/07/21 to 06/09/21.  TOC call done 06/14/21 was timely due to the holiday weekend.  Hospital Course:  For full details, please see H&P, progress notes, consult notes and ancillary notes.  Briefly,  Taylor Johnson is a 75 y.o. female with medical history significant for hypertension, obstructive sleep apnea and previous history of GI bleeding, who presented to the ER with acute onset of bright red bleeding per rectum with associated blood clots.     Hematochezia 2/2 diverticular bleed Hx of diverticulosis and internal hemorrhoids --GI consulted, no plan for endoscopy as pt had colonoscopy not too long ago and this is likely diverticular bleed which did resolve prior to discharge. --Hgb remained stable around 10-11. >>>>  She has done well since returning home.  Stools are normal consistency and brown.  No blood seen.  Appetite is normal.  AKI --Cr 1.22 on presentation.  Improved to 0.8 the next day.  >>> Recommend repeat labs today.  Maintain hydration.  Hypertension continue hydralazine  >>> taking medication as prescribed.  No headaches or chest pain.  # morbid obesity, BMI 48     Discharge Diagnoses:  Principal Problem:   GI bleeding HPI  Lab Results  Component Value Date   NA 140 06/08/2021   K 4.9 06/08/2021   CO2 28 06/08/2021   GLUCOSE 96 06/08/2021   BUN 32 (H) 06/08/2021   CREATININE 0.84 06/08/2021   CALCIUM 8.8 (L) 06/08/2021   GFRNONAA >60 06/08/2021   Lab Results  Component Value Date   CHOL 163 02/05/2019   HDL 63 02/05/2019   LDLCALC 86 02/05/2019   TRIG 72 02/05/2019   CHOLHDL 2.6 02/05/2019   Lab Results  Component Value Date   TSH 1.150 06/04/2020   Lab Results  Component Value Date   HGBA1C 6.1 (H) 01/31/2017    Lab Results  Component Value Date   WBC 6.9 06/08/2021   HGB 10.3 (L) 06/09/2021   HCT 33.3 (L) 06/09/2021   MCV 92.4 06/08/2021   PLT 185 06/08/2021   Lab Results  Component Value Date   ALT 8 06/07/2021   AST 12 (L) 06/07/2021   ALKPHOS 69 06/07/2021   BILITOT 0.4 06/07/2021   No results found for: 25OHVITD2, 25OHVITD3, VD25OH   Review of Systems  Constitutional:  Negative for chills, fatigue and fever.  Respiratory:  Negative for chest tightness and shortness of breath.   Cardiovascular:  Negative for chest pain.  Gastrointestinal:  Negative for abdominal pain, anal bleeding and blood in stool.  Neurological:  Negative for dizziness and headaches.   Patient Active Problem List   Diagnosis Date Noted   GI bleeding 06/07/2021   CPAP use counseling 08/10/2020   Morbid obesity ([place]) 08/10/2020   Abdominal pain 11/07/2019   GERD (gastroesophageal reflux disease) 11/07/2019   Dizziness 02/04/2019   Benign essential HTN 10/23/2018   Functional systolic murmur 13/24/4010   Developmental delay disorder 02/06/2017   Calculus of gallbladder without cholecystitis without obstruction 01/20/2017   Localized edema 04/15/2016   Microscopic hematuria 04/15/2016   OSA on CPAP 11/18/2015   Urge incontinence of urine 05/12/2015   Diverticulosis of colon 05/11/2015   Anxiety, generalized 05/11/2015    No Known Allergies  Past Surgical  History:  Procedure Laterality Date   ABDOMINAL HYSTERECTOMY     BREAST BIOPSY Left    neg   COLON SURGERY     COLONOSCOPY WITH PROPOFOL N/A 12/13/2019   Procedure: COLONOSCOPY WITH PROPOFOL;  Surgeon: Lin Landsman, MD;  Location: [facility];  Service: Endoscopy;  Laterality: N/A;   ESOPHAGOGASTRODUODENOSCOPY (EGD) WITH PROPOFOL N/A 01/29/2017   Procedure: ESOPHAGOGASTRODUODENOSCOPY (EGD) WITH PROPOFOL;  Surgeon: Wilford Corner, MD;  Location: [facility] ENDOSCOPY;  Service: Endoscopy;  Laterality: N/A;    Social History   Tobacco Use    Smoking status: Never   Smokeless tobacco: Never   Tobacco comments:    smoking cessation materials not required  Vaping Use   Vaping Use: Never used  Substance Use Topics   Alcohol use: No    Alcohol/week: 0.0 standard drinks   Drug use: No     Medication list has been reviewed and updated.  Current Meds  Medication Sig   dicyclomine (BENTYL) 20 MG tablet Take 1 tablet (20 mg total) by mouth every 8 (eight) hours as needed for spasms (Abdominal cramping).   hydrALAZINE (APRESOLINE) 25 MG tablet TAKE ONE TABLET BY MOUTH THREE TIMES DAILY   lipase/protease/amylase (CREON) 36000 UNITS CPEP capsule Take 2 capsules with the first bite of each meal and 1 capsule with the first bite of each snack   meclizine (ANTIVERT) 12.5 MG tablet TAKE ONE TABLET BY MOUTH THREE TIMES DAILY AS NEEDED FOR DIZZINESS   metoCLOPramide (REGLAN) 5 MG tablet TAKE ONE TABLET BY MOUTH EVERY MORNING, NOON, EVENING    PHQ 2/9 Scores 06/15/2021 05/26/2021 06/04/2020 05/25/2020  PHQ - 2 Score 0 0 2 3  PHQ- 9 Score 3 4 2 3     GAD 7 : Generalized Anxiety Score 06/15/2021 06/04/2020  Nervous, Anxious, on Edge 0 1  Control/stop worrying 0 1  Worry too much - different things 0 1  Trouble relaxing 0 0  Restless 0 0  Easily annoyed or irritable 0 0  Afraid - awful might happen 0 0  Total GAD 7 Score 0 3    BP Readings from Last 3 Encounters:  06/15/21 138/76  06/09/21 132/61  05/26/21 130/80    Physical Exam Vitals and nursing note reviewed.  Constitutional:      General: She is not in acute distress.    Appearance: She is well-developed. She is obese.  HENT:     Head: Normocephalic and atraumatic.  Cardiovascular:     Rate and Rhythm: Normal rate and regular rhythm.     Pulses: Normal pulses.  Pulmonary:     Effort: Pulmonary effort is normal. No respiratory distress.     Breath sounds: No wheezing or rhonchi.  Abdominal:     General: There is no distension.     Palpations: Abdomen is soft.      Tenderness: There is no abdominal tenderness. There is no guarding.  Musculoskeletal:     Cervical back: Normal range of motion.     Right lower leg: No edema.     Left lower leg: No edema.  Lymphadenopathy:     Cervical: No cervical adenopathy.  Skin:    General: Skin is warm and dry.     Findings: No rash.  Neurological:     General: No focal deficit present.     Mental Status: She is alert. Mental status is at baseline.    Wt Readings from Last 3 Encounters:  06/15/21 241 lb (109.3 kg)  06/07/21 250 lb (  113.4 kg)  05/26/21 250 lb 9.6 oz (113.7 kg)    BP 138/76   Pulse (!) 54   Temp 98.2 F (36.8 C) (Oral)   Ht 5' (1.524 m)   Wt 241 lb (109.3 kg)   SpO2 99%   BMI 47.07 kg/m   Assessment and Plan: 1. Gastrointestinal hemorrhage, unspecified gastrointestinal hemorrhage type GI bleed felt to be due to diverticulosis- extensive disease seen on CT without diverticulitis.  No further bleeding since discharge. Will continue to monitor. - CBC with Differential/Platelet  2. Benign essential HTN Clinically stable exam with well controlled BP. Tolerating medications without side effects at this time. Pt to continue current regimen and low sodium diet; benefits of regular exercise as able discussed. - Basic metabolic panel   Partially dictated using Editor, commissioning. Any errors are unintentional.  Halina Maidens, MD [facility]  06/15/2021

## 2021-06-16 LAB — CBC WITH DIFFERENTIAL/PLATELET
Basophils Absolute: 0 10*3/uL (ref 0.0–0.2)
Basos: 0 %
EOS (ABSOLUTE): 0.4 10*3/uL (ref 0.0–0.4)
Eos: 6 %
Hematocrit: 30.9 % — ABNORMAL LOW (ref 34.0–46.6)
Hemoglobin: 9.9 g/dL — ABNORMAL LOW (ref 11.1–15.9)
Immature Grans (Abs): 0 10*3/uL (ref 0.0–0.1)
Immature Granulocytes: 0 %
Lymphocytes Absolute: 1.8 10*3/uL (ref 0.7–3.1)
Lymphs: 28 %
MCH: 28.4 pg (ref 26.6–33.0)
MCHC: 32 g/dL (ref 31.5–35.7)
MCV: 89 fL (ref 79–97)
Monocytes Absolute: 0.8 10*3/uL (ref 0.1–0.9)
Monocytes: 12 %
Neutrophils Absolute: 3.6 10*3/uL (ref 1.4–7.0)
Neutrophils: 54 %
Platelets: 260 10*3/uL (ref 150–450)
RBC: 3.49 x10E6/uL — ABNORMAL LOW (ref 3.77–5.28)
RDW: 13.8 % (ref 11.7–15.4)
WBC: 6.7 10*3/uL (ref 3.4–10.8)

## 2021-06-16 LAB — BASIC METABOLIC PANEL
BUN/Creatinine Ratio: 10 — ABNORMAL LOW (ref 12–28)
BUN: 11 mg/dL (ref 8–27)
CO2: 26 mmol/L (ref 20–29)
Calcium: 9.5 mg/dL (ref 8.7–10.3)
Chloride: 101 mmol/L (ref 96–106)
Creatinine, Ser: 1.06 mg/dL — ABNORMAL HIGH (ref 0.57–1.00)
Glucose: 91 mg/dL (ref 70–99)
Potassium: 4.1 mmol/L (ref 3.5–5.2)
Sodium: 140 mmol/L (ref 134–144)
eGFR: 55 mL/min/{1.73_m2} — ABNORMAL LOW (ref >59)

## 2021-06-17 ENCOUNTER — Ambulatory Visit (INDEPENDENT_AMBULATORY_CARE_PROVIDER_SITE_OTHER): Payer: Medicare Other | Admitting: Gastroenterology

## 2021-06-17 ENCOUNTER — Encounter: Payer: Self-pay | Admitting: Gastroenterology

## 2021-06-17 VITALS — BP 169/74 | HR 77 | Temp 98.6°F | Ht 64.0 in | Wt 245.2 lb

## 2021-06-17 DIAGNOSIS — K921 Melena: Secondary | ICD-10-CM

## 2021-06-17 DIAGNOSIS — Z1211 Encounter for screening for malignant neoplasm of colon: Secondary | ICD-10-CM

## 2021-06-17 DIAGNOSIS — D62 Acute posthemorrhagic anemia: Secondary | ICD-10-CM | POA: Diagnosis not present

## 2021-06-17 MED ORDER — OMEPRAZOLE 40 MG PO CPDR: 40.0000 mg | DELAYED_RELEASE_CAPSULE | Freq: Every day | ORAL | 3 refills | Status: DC

## 2021-06-17 NOTE — Progress Notes (Signed)
[name], MD [address]  [place]  [place], [place] [address]  Main: [phone]  Fax: [phone]    Gastroenterology Consultation  Referring Provider:     [name], MD Primary Care Physician:  [name], MD Primary Gastroenterologist:  Dr. [name] Reason for Consultation: Melena, acute blood loss anemia, hospital follow-up        HPI:   Taylor Johnson is a 75 y.o. female referred by Dr. Army Melia, Jesse Sans, MD  for consultation & management of recent hospitalization for acute blood loss anemia.  Patient presented to ER on 11/21 secondary to dark stools which the patient and her daughter describe them as dark burgundy which later turned to black.  Her hemoglobin on admission was 11.2 dropped to 10.3 on 11/23.  Elevated BUN/creatinine 32/0.84.  GI was consulted at that time who deemed her bleeding source was most likely diverticular did not recommend any further evaluation.  Patient was discharged home.  She was followed by her PCP, she had a follow-up labs which revealed hemoglobin 9.9, normal BUN/creatinine 11/1.06. 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  She is currently taking 36K 2 to 3 capsules with each meal and 1 with snack.  Patient is no longer experiencing diarrhea and abdominal bloating.  Patient is accompanied by her daughter today.  Patient is no longer experiencing melena.  Her bowel movements have been brown in color.  GI Procedures: EGD 01/2017 normal. Colonoscopy 04/2014 showed diverticulosis. Complete report not available and patient reports that it was done at [facility]  Colonoscopy 12/13/2019 - Hemorrhoids found on perianal exam. - One 10 mm polyp in the ascending colon, removed with a hot snare. Resected and retrieved. - Three 3 to 6 mm polyps in the descending colon and in the ascending colon, removed with a cold snare. Resected and retrieved. -  Diverticulosis in the entire examined colon. - Non-bleeding internal hemorrhoids.  DIAGNOSIS:  A. COLON POLYP X3, ASCENDING; BIOPSY:  - 2 FRAGMENTS OF TUBULAR ADENOMA AND A FRAGMENT OF BENIGN COLONIC  MUCOSA.  - NEGATIVE FOR HIGH-GRADE DYSPLASIA AND MALIGNANCY.   B. COLON POLYP, DESCENDING; BIOPSY:  - FRAGMENTS OF TUBULAR ADENOMA.  - NEGATIVE FOR HIGH-GRADE DYSPLASIA AND MALIGNANCY.     Past Medical History:  Diagnosis Date   Abdominal pain 01/27/2017   Anxiety    Anxiety, generalized 05/11/2015   Cough 06/02/2016   Chronic - followed by Pulmonary   Cough 06/02/2016   Chronic - followed by Pulmonary   Depression    Developmental delay    Diverticulitis    Diverticulosis of colon 05/11/2015   Dysphagia 10/15/2015   Routine Ba Swallow normal; rec modified barium swallow    GERD (gastroesophageal reflux disease)    Hematochezia 11/07/2019   Hypertension    Hypoxia 09/12/2015   Leg swelling    Localized edema 04/15/2016   Microscopic hematuria 04/15/2016   Nausea and vomiting 01/29/2017   Orthostatic hypotension 03/16/2017   OSA on CPAP 11/18/2015   CPAP @ 18 cm H2O started 02/2016   Rectal bleeding 10/15/2018   Urge incontinence of urine 05/12/2015    Past Surgical History:  Procedure Laterality Date   ABDOMINAL HYSTERECTOMY     BREAST BIOPSY Left    neg   COLON SURGERY     COLONOSCOPY WITH PROPOFOL N/A 12/13/2019   Procedure: COLONOSCOPY WITH PROPOFOL;  Surgeon: Lin Landsman, MD;  Location:  [facility];  Service: Endoscopy;  Laterality: N/A;   ESOPHAGOGASTRODUODENOSCOPY (EGD) WITH PROPOFOL N/A 01/29/2017   Procedure: ESOPHAGOGASTRODUODENOSCOPY (EGD) WITH PROPOFOL;  Surgeon: Wilford Corner, MD;  Location: [facility] ENDOSCOPY;  Service: Endoscopy;  Laterality: N/A;    Current Outpatient Medications:    dicyclomine (BENTYL) 20 MG tablet, Take 1 tablet (20 mg total) by mouth every 8 (eight) hours as needed for spasms (Abdominal cramping)., Disp: 15 tablet,  Rfl: 0   hydrALAZINE (APRESOLINE) 25 MG tablet, TAKE ONE TABLET BY MOUTH THREE TIMES DAILY, Disp: 84 tablet, Rfl: 0   lipase/protease/amylase (CREON) 36000 UNITS CPEP capsule, Take 2 capsules with the first bite of each meal and 1 capsule with the first bite of each snack, Disp: 240 capsule, Rfl: 2   meclizine (ANTIVERT) 12.5 MG tablet, TAKE ONE TABLET BY MOUTH THREE TIMES DAILY AS NEEDED FOR DIZZINESS, Disp: 30 tablet, Rfl: 5   metoCLOPramide (REGLAN) 5 MG tablet, TAKE ONE TABLET BY MOUTH EVERY MORNING, NOON, EVENING, Disp: 84 tablet, Rfl: 5   omeprazole (PRILOSEC) 40 MG capsule, Take 1 capsule (40 mg total) by mouth daily., Disp: 90 capsule, Rfl: 3   Family History  Problem Relation Age of Onset   Hypertension Mother    Kidney disease Mother    Dementia Mother    Cancer Father        lung   Bladder Cancer Neg Hx    Breast cancer Neg Hx      Social History   Tobacco Use   Smoking status: Never   Smokeless tobacco: Never   Tobacco comments:    smoking cessation materials not required  Vaping Use   Vaping Use: Never used  Substance Use Topics   Alcohol use: No    Alcohol/week: 0.0 standard drinks   Drug use: No    Allergies as of 06/17/2021   (No Known Allergies)    Review of Systems:    All systems reviewed and negative except where noted in HPI.   Physical Exam:  BP (!) 169/74 (BP Location: Right Arm, Patient Position: Sitting, Cuff Size: Large)   Pulse 77   Temp 98.6 F (37 C) (Oral)   Ht 5\' 4"  (1.626 m)   Wt 245 lb 3.2 oz (111.2 kg)   BMI 42.09 kg/m  No LMP recorded. Patient has had a hysterectomy.  General:   Alert,  Sitting in wheelchair, Well-developed, well-nourished, pleasant and cooperative in NAD Head:  Normocephalic and atraumatic. Eyes:  Sclera clear, no icterus.   Conjunctiva pink. Ears:  Normal auditory acuity. Lungs:  Respirations even and unlabored.  Clear throughout to auscultation.   No wheezes, crackles, or rhonchi. No acute distress. Heart:   Regular rate and rhythm; no murmurs, clicks, rubs, or gallops. Abdomen:  Normal bowel sounds. Obese Soft, non-tender and non-distended without masses, no appreciable hepatosplenomegaly or hernias noted.  No guarding or rebound tenderness.   Rectal: Nor performed Msk:  Symmetrical without gross deformities. Good, equal movement & strength bilaterally. Pulses:  Normal pulses noted. Extremities:  No clubbing or edema.  No cyanosis. Neurologic:  Alert and oriented x3; Skin:  Intact without significant lesions or rashes. No jaundice. Psych:  Alert and cooperative. Normal mood and affect.  Imaging Studies: Reviewed  Assessment and Plan:   Kearia Yin Zahniser is a 75 y.o. 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 is seen as a hospital follow-up for recent admission for melena and acute blood loss anemia  Melena, acute blood loss  anemia, elevated BUN/creatinine: Suggestive of upper GI bleed or small bowel source or proximal colonic source Patient does have history of pancolonic diverticulosis Recommend upper endoscopy to rule out peptic ulcer disease Start omeprazole 40 mg daily before breakfast Check iron panel, B12 and folate levels  Exocrine pancreatic insufficiency: Loose stools and abdominal bloating have resolved Continue Creon 36K 2 to 3 capsules with each meal and 1  Follow up based on the EGD results   [name], MD

## 2021-06-18 ENCOUNTER — Other Ambulatory Visit: Payer: Self-pay

## 2021-06-18 DIAGNOSIS — K219 Gastro-esophageal reflux disease without esophagitis: Secondary | ICD-10-CM

## 2021-06-18 LAB — IRON,TIBC AND FERRITIN PANEL
Ferritin: 64 ng/mL (ref 15–150)
Iron Saturation: 13 % — ABNORMAL LOW (ref 15–55)
Iron: 36 ug/dL (ref 27–139)
Total Iron Binding Capacity: 273 ug/dL (ref 250–450)
UIBC: 237 ug/dL (ref 118–369)

## 2021-06-18 LAB — B12 AND FOLATE PANEL
Folate: 5 ng/mL (ref >3.0)
Vitamin B-12: 434 pg/mL (ref 232–1245)

## 2021-06-21 ENCOUNTER — Telehealth: Payer: Self-pay

## 2021-06-21 NOTE — Telephone Encounter (Signed)
Called patient spoke with sister she understands results

## 2021-07-01 ENCOUNTER — Other Ambulatory Visit: Payer: Self-pay | Admitting: Internal Medicine

## 2021-07-01 DIAGNOSIS — K2970 Gastritis, unspecified, without bleeding: Secondary | ICD-10-CM

## 2021-07-02 NOTE — Telephone Encounter (Signed)
Requested medication (s) are due for refill today: yes  Requested medication (s) are on the active medication list: yes  Last refill:  06/05/21  Future visit scheduled: 07/27/21  Notes to clinic:  This medication can not be delegated, please assess.     Requested Prescriptions  Pending Prescriptions Disp Refills   meclizine (ANTIVERT) 12.5 MG tablet [Pharmacy Med Name: meclizine 12.5 mg tablet] 30 tablet 5    Sig: TAKE ONE TABLET BY MOUTH THREE TIMES DAILY AS NEEDED FOR DIZZINESS     Not Delegated - Gastroenterology: Antiemetics Failed - 07/01/2021  1:09 PM      Failed - This refill cannot be delegated      Passed - Valid encounter within last 6 months    Recent Outpatient Visits           2 weeks ago Gastrointestinal hemorrhage, unspecified gastrointestinal hemorrhage type   [facility] [name], MD   1 year ago Benign essential HTN   [facility] [name], MD   1 year ago Benign essential HTN   [facility] [name], MD   2 years ago Benign essential HTN   [facility] [name], MD   2 years ago Gastritis without bleeding, unspecified chronicity, unspecified gastritis type   [facility] [name], MD       Future Appointments             In 10 months Army Melia Jesse Sans, MD [facility]             metoCLOPramide (REGLAN) 5 MG tablet [Pharmacy Med Name: metoclopramide 5 mg tablet] 84 tablet 5    Sig: TAKE ONE TABLET BY MOUTH EVERY MORNING, [place], [place]     Not Delegated - Gastroenterology: Antiemetics Failed - 07/01/2021  1:09 PM      Failed - This refill cannot be delegated      Passed - Valid encounter within last 6 months    Recent Outpatient Visits           2 weeks ago Gastrointestinal hemorrhage, unspecified gastrointestinal hemorrhage type   [facility] [name], MD   1 year ago Benign essential HTN   [facility]  [name], MD   1 year ago Benign essential HTN   [facility] [name], MD   2 years ago Benign essential HTN   [facility] [name], MD   2 years ago Gastritis without bleeding, unspecified chronicity, unspecified gastritis type   [facility] [name], MD       Future Appointments             In 10 months Army Melia Jesse Sans, MD [facility]

## 2021-07-08 ENCOUNTER — Ambulatory Visit: Admit: 2021-07-08 | Payer: Medicare Other | Admitting: Gastroenterology

## 2021-07-08 SURGERY — COLONOSCOPY WITH PROPOFOL
Anesthesia: Choice

## 2021-07-14 ENCOUNTER — Other Ambulatory Visit: Payer: Self-pay

## 2021-07-14 ENCOUNTER — Ambulatory Visit
Admission: RE | Admit: 2021-07-14 | Discharge: 2021-07-14 | Disposition: A | Discharge status: Home / Self Care | Payer: Medicare Other | Source: Ambulatory Visit | Attending: Internal Medicine | Admitting: Internal Medicine

## 2021-07-14 DIAGNOSIS — Z1231 Encounter for screening mammogram for malignant neoplasm of breast: Secondary | ICD-10-CM | POA: Insufficient documentation

## 2021-07-16 ENCOUNTER — Other Ambulatory Visit: Payer: Medicare Other

## 2021-07-16 ENCOUNTER — Other Ambulatory Visit: Payer: Self-pay

## 2021-07-16 DIAGNOSIS — D649 Anemia, unspecified: Secondary | ICD-10-CM | POA: Diagnosis not present

## 2021-07-17 LAB — CBC WITH DIFFERENTIAL/PLATELET
Basophils Absolute: 0 10*3/uL (ref 0.0–0.2)
Basos: 1 %
EOS (ABSOLUTE): 0.4 10*3/uL (ref 0.0–0.4)
Eos: 7 %
Hematocrit: 32.5 % — ABNORMAL LOW (ref 34.0–46.6)
Hemoglobin: 10.5 g/dL — ABNORMAL LOW (ref 11.1–15.9)
Immature Grans (Abs): 0 10*3/uL (ref 0.0–0.1)
Immature Granulocytes: 0 %
Lymphocytes Absolute: 1.2 10*3/uL (ref 0.7–3.1)
Lymphs: 21 %
MCH: 28.7 pg (ref 26.6–33.0)
MCHC: 32.3 g/dL (ref 31.5–35.7)
MCV: 89 fL (ref 79–97)
Monocytes Absolute: 0.7 10*3/uL (ref 0.1–0.9)
Monocytes: 12 %
Neutrophils Absolute: 3.4 10*3/uL (ref 1.4–7.0)
Neutrophils: 59 %
Platelets: 242 10*3/uL (ref 150–450)
RBC: 3.66 x10E6/uL — ABNORMAL LOW (ref 3.77–5.28)
RDW: 13.7 % (ref 11.7–15.4)
WBC: 5.7 10*3/uL (ref 3.4–10.8)

## 2021-07-30 ENCOUNTER — Other Ambulatory Visit: Payer: Self-pay | Admitting: Gastroenterology

## 2021-07-30 ENCOUNTER — Other Ambulatory Visit: Payer: Self-pay | Admitting: Internal Medicine

## 2021-07-30 DIAGNOSIS — R030 Elevated blood-pressure reading, without diagnosis of hypertension: Secondary | ICD-10-CM

## 2021-07-30 NOTE — Telephone Encounter (Signed)
Requested Prescriptions  Pending Prescriptions Disp Refills   hydrALAZINE (APRESOLINE) 25 MG tablet [Pharmacy Med Name: hydralazine 25 mg tablet] 90 tablet 9    Sig: TAKE ONE TABLET BY MOUTH THREE TIMES DAILY     Cardiovascular:  Vasodilators Failed - 07/30/2021  4:03 PM      Failed - HCT in normal range and within 360 days    Hematocrit  Date Value Ref Range Status  07/16/2021 32.5 (L) 34.0 - 46.6 % Final         Failed - HGB in normal range and within 360 days    Hemoglobin  Date Value Ref Range Status  07/16/2021 10.5 (L) 11.1 - 15.9 g/dL Final         Failed - RBC in normal range and within 360 days    RBC  Date Value Ref Range Status  07/16/2021 3.66 (L) 3.77 - 5.28 x10E6/uL Final  06/08/2021 3.81 (L) 3.87 - 5.11 MIL/uL Final         Failed - Last BP in normal range    BP Readings from Last 1 Encounters:  06/17/21 (!) 169/74         Passed - WBC in normal range and within 360 days    WBC  Date Value Ref Range Status  07/16/2021 5.7 3.4 - 10.8 x10E3/uL Final  06/08/2021 6.9 4.0 - 10.5 K/uL Final         Passed - PLT in normal range and within 360 days    Platelets  Date Value Ref Range Status  07/16/2021 242 150 - 450 x10E3/uL Final         Passed - Valid encounter within last 12 months    Recent Outpatient Visits          1 month ago Gastrointestinal hemorrhage, unspecified gastrointestinal hemorrhage type   [facility] [name], MD   1 year ago Benign essential HTN   [facility] [name], MD   1 year ago Benign essential HTN   [facility] [name], MD   2 years ago Benign essential HTN   [facility] [name], MD   2 years ago Gastritis without bleeding, unspecified chronicity, unspecified gastritis type   [facility] [name], MD      Future Appointments            In 10 months Army Melia Jesse Sans, MD [facility], [facility]

## 2021-08-02 ENCOUNTER — Encounter
Admission: RE | Disposition: A | Discharge status: Home / Self Care | Payer: Self-pay | Source: Home / Self Care | Attending: Gastroenterology

## 2021-08-02 ENCOUNTER — Ambulatory Visit: Payer: Medicare Other | Admitting: Anesthesiology

## 2021-08-02 ENCOUNTER — Encounter: Payer: Self-pay | Admitting: Gastroenterology

## 2021-08-02 ENCOUNTER — Ambulatory Visit
Admission: RE | Admit: 2021-08-02 | Discharge: 2021-08-02 | Disposition: A | Discharge status: Home / Self Care | Payer: Medicare Other | Attending: Gastroenterology | Admitting: Gastroenterology

## 2021-08-02 DIAGNOSIS — D62 Acute posthemorrhagic anemia: Secondary | ICD-10-CM | POA: Insufficient documentation

## 2021-08-02 DIAGNOSIS — K921 Melena: Secondary | ICD-10-CM | POA: Insufficient documentation

## 2021-08-02 DIAGNOSIS — F419 Anxiety disorder, unspecified: Secondary | ICD-10-CM | POA: Diagnosis not present

## 2021-08-02 DIAGNOSIS — Z79899 Other long term (current) drug therapy: Secondary | ICD-10-CM | POA: Diagnosis not present

## 2021-08-02 DIAGNOSIS — K219 Gastro-esophageal reflux disease without esophagitis: Secondary | ICD-10-CM | POA: Diagnosis not present

## 2021-08-02 DIAGNOSIS — I10 Essential (primary) hypertension: Secondary | ICD-10-CM | POA: Insufficient documentation

## 2021-08-02 DIAGNOSIS — F32A Depression, unspecified: Secondary | ICD-10-CM | POA: Insufficient documentation

## 2021-08-02 DIAGNOSIS — Z6839 Body mass index (BMI) 39.0-39.9, adult: Secondary | ICD-10-CM | POA: Diagnosis not present

## 2021-08-02 DIAGNOSIS — Z8719 Personal history of other diseases of the digestive system: Secondary | ICD-10-CM

## 2021-08-02 DIAGNOSIS — K319 Disease of stomach and duodenum, unspecified: Secondary | ICD-10-CM | POA: Diagnosis not present

## 2021-08-02 DIAGNOSIS — K259 Gastric ulcer, unspecified as acute or chronic, without hemorrhage or perforation: Secondary | ICD-10-CM | POA: Diagnosis not present

## 2021-08-02 DIAGNOSIS — K2950 Unspecified chronic gastritis without bleeding: Secondary | ICD-10-CM | POA: Diagnosis not present

## 2021-08-02 DIAGNOSIS — G4733 Obstructive sleep apnea (adult) (pediatric): Secondary | ICD-10-CM | POA: Diagnosis not present

## 2021-08-02 HISTORY — PX: ESOPHAGOGASTRODUODENOSCOPY (EGD) WITH PROPOFOL: SHX5813

## 2021-08-02 SURGERY — ESOPHAGOGASTRODUODENOSCOPY (EGD) WITH PROPOFOL
Anesthesia: General

## 2021-08-02 MED ORDER — PROPOFOL 10 MG/ML IV BOLUS
INTRAVENOUS | Status: DC | PRN
  Administered 2021-08-02: 60 mg via INTRAVENOUS

## 2021-08-02 MED ORDER — SODIUM CHLORIDE 0.9 % IV SOLN
INTRAVENOUS | Status: DC
  Administered 2021-08-02: 1000 mL via INTRAVENOUS

## 2021-08-02 MED ORDER — PROPOFOL 500 MG/50ML IV EMUL
INTRAVENOUS | Status: DC | PRN
  Administered 2021-08-02: 200 ug/kg/min via INTRAVENOUS

## 2021-08-02 NOTE — H&P (Signed)
[name], MD [address]  [place]  [place], [place] [address]  Main: [phone]  Fax: [phone] Pager: [phone]  Primary Care Physician:  [name], MD Primary Gastroenterologist:  Dr. [name]  Pre-Procedure History & Physical: HPI:  Taylor Johnson is a 76 y.o. female is here for an endoscopy.   Past Medical History:  Diagnosis Date   Abdominal pain 01/27/2017   Anxiety    Anxiety, generalized 05/11/2015   Cough 06/02/2016   Chronic - followed by Pulmonary   Cough 06/02/2016   Chronic - followed by Pulmonary   Depression    Developmental delay    Diverticulitis    Diverticulosis of colon 05/11/2015   Dysphagia 10/15/2015   Routine Ba Swallow normal; rec modified barium swallow    GERD (gastroesophageal reflux disease)    Hematochezia 11/07/2019   Hypertension    Hypoxia 09/12/2015   Leg swelling    Localized edema 04/15/2016   Microscopic hematuria 04/15/2016   Nausea and vomiting 01/29/2017   Orthostatic hypotension 03/16/2017   OSA on CPAP 11/18/2015   CPAP @ 18 cm H2O started 02/2016   Rectal bleeding 10/15/2018   Urge incontinence of urine 05/12/2015    Past Surgical History:  Procedure Laterality Date   ABDOMINAL HYSTERECTOMY     BREAST BIOPSY Left    neg   COLON SURGERY     COLONOSCOPY WITH PROPOFOL N/A 12/13/2019   Procedure: COLONOSCOPY WITH PROPOFOL;  Surgeon: Lin Landsman, MD;  Location: [facility];  Service: Endoscopy;  Laterality: N/A;   ESOPHAGOGASTRODUODENOSCOPY (EGD) WITH PROPOFOL N/A 01/29/2017   Procedure: ESOPHAGOGASTRODUODENOSCOPY (EGD) WITH PROPOFOL;  Surgeon: Wilford Corner, MD;  Location: [facility] ENDOSCOPY;  Service: Endoscopy;  Laterality: N/A;    Prior to Admission medications   Medication Sig Start Date End Date Taking? Authorizing Provider  dicyclomine (BENTYL) 20 MG tablet Take 1 tablet (20 mg total) by mouth every 8 (eight) hours as needed for spasms (Abdominal cramping). 02/05/21    Ward, Delice Bison, DO  hydrALAZINE (APRESOLINE) 25 MG tablet TAKE ONE TABLET BY MOUTH THREE TIMES DAILY 07/30/21   [name], MD  lipase/protease/amylase (CREON) 36000 UNITS CPEP capsule Take 2 capsules with the first bite of each meal and 1 capsule with the first bite of each snack 06/07/21   Lin Landsman, MD  meclizine (ANTIVERT) 12.5 MG tablet TAKE ONE TABLET BY MOUTH THREE TIMES DAILY AS NEEDED FOR DIZZINESS 07/02/21   [name], MD  metoCLOPramide (REGLAN) 5 MG tablet TAKE ONE TABLET BY MOUTH EVERY MORNING, NOON, EVENING 07/02/21   [name], MD  omeprazole (PRILOSEC) 40 MG capsule Take 1 capsule (40 mg total) by mouth daily. 06/17/21   Lin Landsman, MD    Allergies as of 06/18/2021   (No Known Allergies)    Family History  Problem Relation Age of Onset   Hypertension Mother    Kidney disease Mother    Dementia Mother    Cancer Father        lung   Bladder Cancer Neg Hx    Breast cancer Neg Hx     Social History   Socioeconomic History   Marital status: Single    Spouse name: Not on file   Number of children: 0   Years of education: Not on file   Highest education level: 5th grade  Occupational History    Employer: retired  Tobacco Use   Smoking status: Never   Smokeless  tobacco: Never   Tobacco comments:    smoking cessation materials not required  Vaping Use   Vaping Use: Never used  Substance and Sexual Activity   Alcohol use: No    Alcohol/week: 0.0 standard drinks   Drug use: No   Sexual activity: Never    Birth control/protection: Post-menopausal  Other Topics Concern   Not on file  Social History Narrative   Pt lives with her niece Alvie Heidelberg Finklea   Social Determinants of Health   Financial Resource Strain: Low Risk    Difficulty of Paying Living Expenses: Not very hard  Food Insecurity: No Food Insecurity   Worried About Charity fundraiser in the Last Year: Never true   Ran Out of Food in the Last Year: Never  true  Transportation Needs: No Transportation Needs   Lack of Transportation (Medical): No   Lack of Transportation (Non-Medical): No  Physical Activity: Inactive   Days of Exercise per Week: 0 days   Minutes of Exercise per Session: 0 min  Stress: No Stress Concern Present   Feeling of Stress : Only a little  Social Connections: Socially Isolated   Frequency of Communication with Friends and Family: More than three times a week   Frequency of Social Gatherings with Friends and Family: More than three times a week   Attends Religious Services: Never   Marine scientist or Organizations: No   Attends Music therapist: Never   Marital Status: Never married  Human resources officer Violence: Not At Risk   Fear of Current or Ex-Partner: No   Emotionally Abused: No   Physically Abused: No   Sexually Abused: No    Review of Systems: See HPI, otherwise negative ROS  Physical Exam: BP (!) 162/75    Pulse 77    Temp 97.6 F (36.4 C) (Temporal)    Resp (!) 21    Ht 5' (1.524 m)    Wt 90.9 kg    SpO2 100%    BMI 39.14 kg/m  General:   Alert,  pleasant and cooperative in NAD Head:  Normocephalic and atraumatic. Neck:  Supple; no masses or thyromegaly. Lungs:  Clear throughout to auscultation.    Heart:  Regular rate and rhythm. Abdomen:  Soft, nontender and nondistended. Normal bowel sounds, without guarding, and without rebound.   Neurologic:  Alert and  oriented x4;  grossly normal neurologically.  Impression/Plan: Taylor Johnson is here for an endoscopy to be performed for h/o melena, anemia  Risks, benefits, limitations, and alternatives regarding  endoscopy have been reviewed with the patient.  Questions have been answered.  All parties agreeable.   [name], MD  08/02/2021, 11:24 AM

## 2021-08-02 NOTE — Transfer of Care (Signed)
Immediate Anesthesia Transfer of Care Note  Patient: Taylor Johnson  Procedure(s) Performed: ESOPHAGOGASTRODUODENOSCOPY (EGD) WITH PROPOFOL  Patient Location: PACU  Anesthesia Type:General  Level of Consciousness: sedated  Airway & Oxygen Therapy: Patient Spontanous Breathing and Patient connected to face mask oxygen  Post-op Assessment: Report given to RN and Post -op Vital signs reviewed and stable  Post vital signs: Reviewed and stable  Last Vitals:  Vitals Value Taken Time  BP 162/75 08/02/21 1122  Temp 36.4 C 08/02/21 1120  Pulse 71 08/02/21 1124  Resp 20 08/02/21 1124  SpO2 100 % 08/02/21 1124  Vitals shown include unvalidated device data.  Last Pain:  Vitals:   08/02/21 1120  TempSrc: Temporal  PainSc: Asleep         Complications: No notable events documented.

## 2021-08-02 NOTE — Op Note (Signed)
[facility] Gastroenterology Patient Name: Taylor Johnson Procedure Date: 08/02/2021 10:16 AM MRN: [id number] Account #: [id number] Date of Birth: [birth year] Admit Type: Outpatient Age: 76 Room: [facility] ENDO ROOM 1 Gender: Female Note Status: Finalized Instrument Name: Upper Endoscope [id number] Procedure:             Upper GI endoscopy Indications:           Acute post hemorrhagic anemia, Melena Providers:             Lin Landsman MD, MD Referring MD:          Youlanda Roys. Lovie Macadamia, MD (Referring MD) Medicines:             General Anesthesia Complications:         No immediate complications. Estimated blood loss: None. Procedure:             Pre-Anesthesia Assessment:                        - Prior to the procedure, a History and Physical was                         performed, and patient medications and allergies were                         reviewed. The risks and benefits of the procedure and                         the sedation options and risks were discussed with the                         patient's sister. All questions were answered and                         informed consent was obtained. Patient identification                         and proposed procedure were verified by the physician,                         the nurse, the anesthesiologist, the anesthetist and                         the technician in the pre-procedure area in the                         procedure room in the endoscopy suite. Mental Status                         Examination: normal. Airway Examination: normal                         oropharyngeal airway and neck mobility. Respiratory                         Examination: clear to auscultation. CV Examination:                         normal. Prophylactic Antibiotics: The patient does not  require prophylactic antibiotics. Prior                         Anticoagulants: The patient has taken no previous                          anticoagulant or antiplatelet agents. ASA Grade                         Assessment: III - A patient with severe systemic                         disease. After reviewing the risks and benefits, the                         patient was deemed in satisfactory condition to                         undergo the procedure. The anesthesia plan was to use                         general anesthesia. Immediately prior to                         administration of medications, the patient was                         re-assessed for adequacy to receive sedatives. The                         heart rate, respiratory rate, oxygen saturations,                         blood pressure, adequacy of pulmonary ventilation, and                         response to care were monitored throughout the                         procedure. The physical status of the patient was                         re-assessed after the procedure.                        After obtaining informed consent, the endoscope was                         passed under direct vision. Throughout the procedure,                         the patient's blood pressure, pulse, and oxygen                         saturations were monitored continuously. The Endoscope                         was introduced through the mouth, and advanced to the  second part of duodenum. The upper GI endoscopy was                         accomplished without difficulty. The patient tolerated                         the procedure well. Findings:      Esophagogastric landmarks were identified: the gastroesophageal junction       was found at 40 cm from the incisors.      The gastroesophageal junction and examined esophagus were normal.      Multiple dispersed diminutive erosions with stigmata of recent bleeding       were found in the gastric antrum. Biopsies were taken with a cold       forceps for Helicobacter pylori testing.      The gastric  body and incisura were normal. Biopsies were taken with a       cold forceps for Helicobacter pylori testing.      The cardia and gastric fundus were normal on retroflexion.      The duodenal bulb was normal. Could not traverse second portion due to       patient's position despite several attempts Impression:            - Esophagogastric landmarks identified.                        - Normal gastroesophageal junction and esophagus.                        - Erosive gastropathy with stigmata of recent                         bleeding. Biopsied.                        - Normal gastric body and incisura. Biopsied.                        - Normal duodenal bulb. Recommendation:        - Discharge patient to home (with escort).                        - Resume previous diet today.                        - Continue present medications.                        - Await pathology results. Procedure Code(s):     --- Professional ---                        [phone], Esophagogastroduodenoscopy, flexible,                         transoral; with biopsy, single or multiple Diagnosis Code(s):     --- Professional ---                        K92.2, Gastrointestinal hemorrhage, unspecified  D62, Acute posthemorrhagic anemia                        K92.1, Melena (includes Hematochezia) CPT copyright 2019 American Medical Association. All rights reserved. The codes documented in this report are preliminary and upon coder review may  be revised to meet current compliance requirements. Dr. Ulyess Mort Lin Landsman MD, MD 08/02/2021 11:19:56 AM This report has been signed electronically. Number of Addenda: 0 Note Initiated On: 08/02/2021 10:16 AM Estimated Blood Loss:  Estimated blood loss: none.      [facility]

## 2021-08-02 NOTE — Anesthesia Procedure Notes (Signed)
Date/Time: 08/02/2021 11:15 AM Performed by: Nelda Marseille, CRNA Oxygen Delivery Method: Simple face mask

## 2021-08-02 NOTE — Anesthesia Preprocedure Evaluation (Signed)
Anesthesia Evaluation  Patient identified by MRN, date of birth, ID band Patient confused    Reviewed: Allergy & Precautions, NPO status , Patient's Chart, lab work & pertinent test results  History of Anesthesia Complications Negative for: history of anesthetic complications  Airway Mallampati: IV       Dental  (+) Missing, Chipped, Dental Advidsory Given   Pulmonary neg shortness of breath, asthma , sleep apnea and Continuous Positive Airway Pressure Ventilation , neg recent URI,           Cardiovascular Exercise Tolerance: Poor hypertension, (-) Past MI and (-) Cardiac Stents (-) dysrhythmias + Valvular Problems/Murmurs      Neuro/Psych PSYCHIATRIC DISORDERS Anxiety Depression negative neurological ROS     GI/Hepatic Neg liver ROS, GERD  ,  Endo/Other  Morbid obesity  Renal/GU Renal disease (UTI)     Musculoskeletal   Abdominal   Peds  Hematology negative hematology ROS (+)   Anesthesia Other Findings Past Medical History: 01/27/2017: Abdominal pain No date: Anxiety 05/11/2015: Anxiety, generalized 06/02/2016: Cough     Comment:  Chronic - followed by Pulmonary 06/02/2016: Cough     Comment:  Chronic - followed by Pulmonary No date: Depression No date: Developmental delay No date: Diverticulitis 05/11/2015: Diverticulosis of colon 10/15/2015: Dysphagia     Comment:  Routine Ba Swallow normal; rec modified barium swallow  No date: GERD (gastroesophageal reflux disease) 11/07/2019: Hematochezia No date: Hypertension 09/12/2015: Hypoxia No date: Leg swelling 04/15/2016: Localized edema 04/15/2016: Microscopic hematuria 01/29/2017: Nausea and vomiting 03/16/2017: Orthostatic hypotension 11/18/2015: OSA on CPAP     Comment:  CPAP @ 18 cm H2O started 02/2016 10/15/2018: Rectal bleeding 05/12/2015: Urge incontinence of urine   Reproductive/Obstetrics                              Anesthesia Physical  Anesthesia Plan  ASA: 3 and emergent  Anesthesia Plan: General   Post-op Pain Management:    Induction: Intravenous  PONV Risk Score and Plan: 3 and Propofol infusion and TIVA  Airway Management Planned: Nasal Cannula and Natural Airway  Additional Equipment:   Intra-op Plan:   Post-operative Plan:   Informed Consent: I have reviewed the patients History and Physical, chart, labs and discussed the procedure including the risks, benefits and alternatives for the proposed anesthesia with the patient or authorized representative who has indicated his/her understanding and acceptance.       Plan Discussed with: CRNA and Surgeon  Anesthesia Plan Comments:         Anesthesia Quick Evaluation

## 2021-08-03 LAB — SURGICAL PATHOLOGY

## 2021-08-03 NOTE — Anesthesia Postprocedure Evaluation (Signed)
Anesthesia Post Note  Patient: Taylor Johnson  Procedure(s) Performed: ESOPHAGOGASTRODUODENOSCOPY (EGD) WITH PROPOFOL  Patient location during evaluation: Endoscopy Anesthesia Type: General Level of consciousness: awake and alert Pain management: pain level controlled Vital Signs Assessment: post-procedure vital signs reviewed and stable Respiratory status: spontaneous breathing, nonlabored ventilation, respiratory function stable and patient connected to nasal cannula oxygen Cardiovascular status: blood pressure returned to baseline and stable Postop Assessment: no apparent nausea or vomiting Anesthetic complications: no   No notable events documented.   Last Vitals:  Vitals:   08/02/21 1120 08/02/21 1144  BP: (!) 162/75 (!) 163/99  Pulse: 77 65  Resp: (!) 21 16  Temp: 36.4 C   SpO2: 100% 100%    Last Pain:  Vitals:   08/03/21 0748  TempSrc:   PainSc: 0-No pain                 Martha Clan

## 2021-08-05 ENCOUNTER — Telehealth: Payer: Self-pay

## 2021-08-05 MED ORDER — OMEPRAZOLE 40 MG PO CPDR: DELAYED_RELEASE_CAPSULE | ORAL | 0 refills | Status: DC

## 2021-08-05 NOTE — Telephone Encounter (Signed)
Patient sister verbalized understanding of results. Sent medication to pharmacy

## 2021-08-05 NOTE — Telephone Encounter (Signed)
-----   Message from Lin Landsman, MD sent at 08/05/2021  3:07 PM EST ----- Please inform patient's sister that the pathology results did not look worrisome.  Recommend omeprazole 40mg  twice daily before meals for 1 month then continue 40 mg once a day long-term. She may need prescription for Prilosec  Rohini Vanga

## 2021-08-09 ENCOUNTER — Ambulatory Visit (INDEPENDENT_AMBULATORY_CARE_PROVIDER_SITE_OTHER): Payer: Medicare Other | Admitting: Internal Medicine

## 2021-08-09 VITALS — BP 126/103 | HR 75 | Resp 18 | Ht 64.0 in | Wt 251.0 lb

## 2021-08-09 DIAGNOSIS — Z9989 Dependence on other enabling machines and devices: Secondary | ICD-10-CM

## 2021-08-09 DIAGNOSIS — I10 Essential (primary) hypertension: Secondary | ICD-10-CM | POA: Diagnosis not present

## 2021-08-09 DIAGNOSIS — Z7189 Other specified counseling: Secondary | ICD-10-CM

## 2021-08-09 DIAGNOSIS — G4733 Obstructive sleep apnea (adult) (pediatric): Secondary | ICD-10-CM | POA: Diagnosis not present

## 2021-08-09 NOTE — Patient Instructions (Signed)

## 2021-08-09 NOTE — Progress Notes (Signed)
[facility] [place], [place] [address] [address]  Pulmonary Sleep Medicine   Office Visit Note  Patient Name: ARISBEL MAIONE DOB: 10-08-1945 MRN [id number]    Chief Complaint: Obstructive Sleep Apnea visit  Brief History:  Doxie is seen today for follow up appointment. The patient has a 8 year history of sleep apnea. Patient is using PAP nightly.  The patient feels better after sleeping with PAP.  The patient reports benefiting from PAP use. Reported sleepiness is  improved and the Epworth Sleepiness Score is 0 out of 24. The patient does take naps falling asleep in front of TV. The patient complains of the following:  The compliance download shows compliance with an average use time of 5:27 hours @ 82%. The AHI is 22.6. The patient does not complain of limb movements disrupting sleep.  ROS  General: (-) fever, (-) chills, (-) night sweat Nose and Sinuses: (-) nasal stuffiness or itchiness, (-) postnasal drip, (-) nosebleeds, (-) sinus trouble. Mouth and Throat: (-) sore throat, (-) hoarseness. Neck: (-) swollen glands, (-) enlarged thyroid, (-) neck pain. Respiratory: - cough, - shortness of breath, - wheezing. Neurologic: - numbness, - tingling. Psychiatric: - anxiety, - depression   Current Medication: Outpatient Encounter Medications as of 08/09/2021  Medication Sig Note   dicyclomine (BENTYL) 20 MG tablet Take 1 tablet (20 mg total) by mouth every 8 (eight) hours as needed for spasms (Abdominal cramping). 06/08/2021: LF 05/12/21 28 DS   hydrALAZINE (APRESOLINE) 25 MG tablet TAKE ONE TABLET BY MOUTH THREE TIMES DAILY    lipase/protease/amylase (CREON) 36000 UNITS CPEP capsule Take 2 capsules with the first bite of each meal and 1 capsule with the first bite of each snack 06/08/2021: LF 05/06/21 28 DS   meclizine (ANTIVERT) 12.5 MG tablet TAKE ONE TABLET BY MOUTH THREE TIMES DAILY AS NEEDED FOR DIZZINESS    metoCLOPramide (REGLAN) 5 MG tablet TAKE ONE TABLET BY  MOUTH EVERY MORNING, NOON, EVENING    omeprazole (PRILOSEC) 40 MG capsule Take 1 capsule (40 mg total) by mouth 2 (two) times daily before a meal for 30 days, THEN 1 capsule (40 mg total) daily.    No facility-administered encounter medications on file as of 08/09/2021.    Surgical History: Past Surgical History:  Procedure Laterality Date   ABDOMINAL HYSTERECTOMY     BREAST BIOPSY Left    neg   COLON SURGERY     COLONOSCOPY WITH PROPOFOL N/A 12/13/2019   Procedure: COLONOSCOPY WITH PROPOFOL;  Surgeon: Lin Landsman, MD;  Location: [facility] ENDOSCOPY;  Service: Endoscopy;  Laterality: N/A;   ESOPHAGOGASTRODUODENOSCOPY (EGD) WITH PROPOFOL N/A 01/29/2017   Procedure: ESOPHAGOGASTRODUODENOSCOPY (EGD) WITH PROPOFOL;  Surgeon: Wilford Corner, MD;  Location: [facility] ENDOSCOPY;  Service: Endoscopy;  Laterality: N/A;   ESOPHAGOGASTRODUODENOSCOPY (EGD) WITH PROPOFOL N/A 08/02/2021   Procedure: ESOPHAGOGASTRODUODENOSCOPY (EGD) WITH PROPOFOL;  Surgeon: Lin Landsman, MD;  Location: [facility] ENDOSCOPY;  Service: Gastroenterology;  Laterality: N/A;    Medical History: Past Medical History:  Diagnosis Date   Abdominal pain 01/27/2017   Anxiety    Anxiety, generalized 05/11/2015   Cough 06/02/2016   Chronic - followed by Pulmonary   Cough 06/02/2016   Chronic - followed by Pulmonary   Depression    Developmental delay    Diverticulitis    Diverticulosis of colon 05/11/2015   Dysphagia 10/15/2015   Routine Ba Swallow normal; rec modified barium swallow    GERD (gastroesophageal reflux disease)    Hematochezia 11/07/2019  Hypertension    Hypoxia 09/12/2015   Leg swelling    Localized edema 04/15/2016   Microscopic hematuria 04/15/2016   Nausea and vomiting 01/29/2017   Orthostatic hypotension 03/16/2017   OSA on CPAP 11/18/2015   CPAP @ 18 cm H2O started 02/2016   Rectal bleeding 10/15/2018   Urge incontinence of urine 05/12/2015    Family History: Non contributory to the present  illness  Social History: Social History   Socioeconomic History   Marital status: Single    Spouse name: Not on file   Number of children: 0   Years of education: Not on file   Highest education level: 5th grade  Occupational History    Employer: retired  Tobacco Use   Smoking status: Never   Smokeless tobacco: Never   Tobacco comments:    smoking cessation materials not required  Vaping Use   Vaping Use: Never used  Substance and Sexual Activity   Alcohol use: No    Alcohol/week: 0.0 standard drinks   Drug use: No   Sexual activity: Never    Birth control/protection: Post-menopausal  Other Topics Concern   Not on file  Social History Narrative   Pt lives with her niece Alvie Heidelberg Chui   Social Determinants of Health   Financial Resource Strain: Low Risk    Difficulty of Paying Living Expenses: Not very hard  Food Insecurity: No Food Insecurity   Worried About Charity fundraiser in the Last Year: Never true   Ran Out of Food in the Last Year: Never true  Transportation Needs: No Transportation Needs   Lack of Transportation (Medical): No   Lack of Transportation (Non-Medical): No  Physical Activity: Inactive   Days of Exercise per Week: 0 days   Minutes of Exercise per Session: 0 min  Stress: No Stress Concern Present   Feeling of Stress : Only a little  Social Connections: Socially Isolated   Frequency of Communication with Friends and Family: More than three times a week   Frequency of Social Gatherings with Friends and Family: More than three times a week   Attends Religious Services: Never   Marine scientist or Organizations: No   Attends Music therapist: Never   Marital Status: Never married  Human resources officer Violence: Not At Risk   Fear of Current or Ex-Partner: No   Emotionally Abused: No   Physically Abused: No   Sexually Abused: No    Vital Signs: Blood pressure (!) 126/103, pulse 75, resp. rate 18, height 5' 4"  (1.626 m),  weight 251 lb (113.9 kg), SpO2 92 %. Body mass index is 43.08 kg/m.    Examination: General Appearance: The patient is well-developed, well-nourished, and in no distress. Neck Circumference:  Skin: Gross inspection of skin unremarkable. Head: normocephalic,38 no gross deformities. Eyes: no gross deformities noted. ENT: ears appear grossly normal Neurologic: Alert and oriented. No involuntary movements.    EPWORTH SLEEPINESS SCALE:  Scale:  (0)= no chance of dozing; (1)= slight chance of dozing; (2)= moderate chance of dozing; (3)= high chance of dozing  Chance  Situtation    Sitting and reading: 0    Watching TV: 0    Sitting Inactive in public: 0    As a passenger in car: 0      Lying down to rest: 0    Sitting and talking: 0    Sitting quielty after lunch: 0    In a car, stopped in traffic: 0   TOTAL  SCORE:   0 out of 24    SLEEP STUDIES:  PSG 03/16/17 AHI 5 SpO59mn 71% (original not available),   CPAP COMPLIANCE DATA:  Date Range: 08/05/20 - 08/04/21  Average Daily Use: 5:27 hours  Median Use: 5:30  Compliance for > 4 Hours: 82%  AHI: 22.6 respiratory events per hour  Days Used: 358/365  Mask Leak: 22.6lpm  95th Percentile Pressure: 8 cmH2O   LABS: Recent Results (from the past 2160 hour(s))  Lipase, blood     Status: None   Collection Time: 06/07/21  2:35 PM  Result Value Ref Range   Lipase 21 11 - 51 U/L    Comment: [facility] [address], [place] [id number] Comprehensive metabolic panel     Status: Abnormal   Collection Time: 06/07/21  2:35 PM  Result Value Ref Range   Sodium 139 135 - 145 mmol/L   Potassium 4.0 3.5 - 5.1 mmol/L   Chloride 104 98 - 111 mmol/L   CO2 29 22 - 32 mmol/L   Glucose, Bld 103 (H) 70 - 99 mg/dL    Comment: Glucose reference range applies only to samples taken after fasting for at least 8 hours.   BUN 28 (H) 8 - 23 mg/dL   Creatinine, Ser 1.22 (H) 0.44 - 1.00 mg/dL    Calcium 9.1 8.9 - 10.3 mg/dL   Total Protein 7.3 6.5 - 8.1 g/dL   Albumin 3.7 3.5 - 5.0 g/dL   AST 12 (L) 15 - 41 U/L   ALT 8 0 - 44 U/L   Alkaline Phosphatase 69 38 - 126 U/L   Total Bilirubin 0.4 0.3 - 1.2 mg/dL   GFR, Estimated 47 (L) >60 mL/min    Comment: (NOTE) Calculated using the CKD-EPI Creatinine Equation (2021)    Anion gap 6 5 - 15    Comment: [facility] [address], [place] [id number] CBC     Status: Abnormal   Collection Time: 06/07/21  2:35 PM  Result Value Ref Range   WBC 6.9 4.0 - 10.5 K/uL   RBC 4.03 3.87 - 5.11 MIL/uL   Hemoglobin 11.2 (L) 12.0 - 15.0 g/dL   HCT 37.7 36.0 - 46.0 %   MCV 93.5 80.0 - 100.0 fL   MCH 27.8 26.0 - 34.0 pg   MCHC 29.7 (L) 30.0 - 36.0 g/dL   RDW 16.0 (H) 11.5 - 15.5 %   Platelets 197 150 - 400 K/uL   nRBC 0.0 0.0 - 0.2 %    Comment: [facility] at [facility] [address] [place]  [id number] Basic metabolic panel     Status: Abnormal   Collection Time: 06/08/21  4:04 AM  Result Value Ref Range   Sodium 140 135 - 145 mmol/L   Potassium 4.9 3.5 - 5.1 mmol/L    Comment: HEMOLYSIS AT THIS LEVEL MAY AFFECT RESULT   Chloride 108 98 - 111 mmol/L   CO2 28 22 - 32 mmol/L   Glucose, Bld 96 70 - 99 mg/dL    Comment: Glucose reference range applies only to samples taken after fasting for at least 8 hours.   BUN 32 (H) 8 - 23 mg/dL   Creatinine, Ser 0.84 0.44 - 1.00 mg/dL   Calcium 8.8 (L) 8.9 - 10.3 mg/dL   GFR, Estimated >60 >60 mL/min    Comment: (NOTE) Calculated using the CKD-EPI Creatinine Equation (2021)    Anion  gap 4 (L) 5 - 15    Comment: Performed at [facility], [address] [place], [place] [address]  CBC     Status: Abnormal   Collection Time: 06/08/21  4:04 AM  Result Value Ref Range   WBC 6.9 4.0 - 10.5 K/uL   RBC 3.81 (L) 3.87 - 5.11 MIL/uL   Hemoglobin 10.7 (L) 12.0 - 15.0 g/dL   HCT 35.2 (L) 36.0 - 46.0 %   MCV 92.4 80.0 - 100.0 fL   MCH  28.1 26.0 - 34.0 pg   MCHC 30.4 30.0 - 36.0 g/dL   RDW 16.0 (H) 11.5 - 15.5 %   Platelets 185 150 - 400 K/uL   nRBC 0.0 0.0 - 0.2 %    Comment: Performed at [facility], [address] [place], [place] [address]  Resp Panel by RT-PCR (Flu A&B, Covid) Nasopharyngeal Swab     Status: None   Collection Time: 06/08/21 11:32 AM   Specimen: Nasopharyngeal Swab; Nasopharyngeal(NP) swabs in vial transport medium  Result Value Ref Range   SARS Coronavirus 2 by RT PCR NEGATIVE NEGATIVE    Comment: (NOTE) SARS-CoV-2 target nucleic acids are NOT DETECTED.  The SARS-CoV-2 RNA is generally detectable in upper respiratory specimens during the acute phase of infection. The lowest concentration of SARS-CoV-2 viral copies this assay can detect is 138 copies/mL. A negative result does not preclude SARS-Cov-2 infection and should not be used as the sole basis for treatment or other patient management decisions. A negative result may occur with  improper specimen collection/handling, submission of specimen other than nasopharyngeal swab, presence of viral mutation(s) within the areas targeted by this assay, and inadequate number of viral copies(<138 copies/mL). A negative result must be combined with clinical observations, patient history, and epidemiological information. The expected result is Negative.  Fact Sheet for Patients:  EntrepreneurPulse.com.au  Fact Sheet for Healthcare Providers:  IncredibleEmployment.be  This test is no t yet approved or cleared by the [place] FDA and  has been authorized for detection and/or diagnosis of SARS-CoV-2 by FDA under an Emergency Use Authorization (EUA). This EUA will remain  in effect (meaning this test can be used) for the duration of the COVID-19 declaration under Section 564(b)(1) of the Act, 21 U.S.C.section 360bbb-3(b)(1), unless the authorization is terminated  or revoked sooner.        Influenza A by PCR NEGATIVE NEGATIVE   Influenza B by PCR NEGATIVE NEGATIVE    Comment: (NOTE) The Xpert Xpress SARS-CoV-2/FLU/RSV plus assay is intended as an aid in the diagnosis of influenza from Nasopharyngeal swab specimens and should not be used as a sole basis for treatment. Nasal washings and aspirates are unacceptable for Xpert Xpress SARS-CoV-2/FLU/RSV testing.  Fact Sheet for Patients: EntrepreneurPulse.com.au  Fact Sheet for Healthcare Providers: IncredibleEmployment.be  This test is not yet approved or cleared by the [place] FDA and has been authorized for detection and/or diagnosis of SARS-CoV-2 by FDA under an Emergency Use Authorization (EUA). This EUA will remain in effect (meaning this test can be used) for the duration of the COVID-19 declaration under Section 564(b)(1) of the Act, 21 U.S.C. section 360bbb-3(b)(1), unless the authorization is terminated or revoked.  Performed at [facility], [address] [place], [place] [address]   Hemoglobin and hematocrit, blood     Status: Abnormal   Collection Time: 06/08/21 12:55 PM  Result Value Ref Range   Hemoglobin 11.2 (L) 12.0 - 15.0 g/dL   HCT 36.2  36.0 - 46.0 %    Comment: Performed at [facility], [address] [place], [place] [address]  Hemoglobin and hematocrit, blood     Status: Abnormal   Collection Time: 06/09/21  7:22 AM  Result Value Ref Range   Hemoglobin 10.3 (L) 12.0 - 15.0 g/dL   HCT 33.3 (L) 36.0 - 46.0 %    Comment: Performed at [facility], [address] [place], [place] [address]  CBC with Differential/Platelet     Status: Abnormal   Collection Time: 06/15/21  2:33 PM  Result Value Ref Range   WBC 6.7 3.4 - 10.8 x10E3/uL   RBC 3.49 (L) 3.77 - 5.28 x10E6/uL   Hemoglobin 9.9 (L) 11.1 - 15.9 g/dL   Hematocrit 30.9 (L) 34.0 - 46.6 %   MCV 89 79 - 97 fL   MCH 28.4 26.6 - 33.0 pg   MCHC 32.0 31.5 - 35.7 g/dL    RDW 13.8 11.7 - 15.4 %   Platelets 260 150 - 450 x10E3/uL   Neutrophils 54 Not Estab. %   Lymphs 28 Not Estab. %   Monocytes 12 Not Estab. %   Eos 6 Not Estab. %   Basos 0 Not Estab. %   Neutrophils Absolute 3.6 1.4 - 7.0 x10E3/uL   Lymphocytes Absolute 1.8 0.7 - 3.1 x10E3/uL   Monocytes Absolute 0.8 0.1 - 0.9 x10E3/uL   EOS (ABSOLUTE) 0.4 0.0 - 0.4 x10E3/uL   Basophils Absolute 0.0 0.0 - 0.2 x10E3/uL   Immature Granulocytes 0 Not Estab. %   Immature Grans (Abs) 0.0 0.0 - 0.1 G18E9/HB  Basic metabolic panel     Status: Abnormal   Collection Time: 06/15/21  2:33 PM  Result Value Ref Range   Glucose 91 70 - 99 mg/dL   BUN 11 8 - 27 mg/dL   Creatinine, Ser 1.06 (H) 0.57 - 1.00 mg/dL   eGFR 55 (L) >59 mL/min/1.73   BUN/Creatinine Ratio 10 (L) 12 - 28   Sodium 140 134 - 144 mmol/L   Potassium 4.1 3.5 - 5.2 mmol/L   Chloride 101 96 - 106 mmol/L   CO2 26 20 - 29 mmol/L   Calcium 9.5 8.7 - 10.3 mg/dL  B12 and Folate Panel     Status: None   Collection Time: 06/17/21  3:20 PM  Result Value Ref Range   Vitamin B-12 434 232 - 1,245 pg/mL   Folate 5.0 >3.0 ng/mL    Comment: A serum folate concentration of less than 3.1 ng/mL is considered to represent clinical deficiency.   Iron, TIBC and Ferritin Panel     Status: Abnormal   Collection Time: 06/17/21  3:20 PM  Result Value Ref Range   Total Iron Binding Capacity 273 250 - 450 ug/dL   UIBC 237 118 - 369 ug/dL   Iron 36 27 - 139 ug/dL   Iron Saturation 13 (L) 15 - 55 %   Ferritin 64 15 - 150 ng/mL  CBC with Differential/Platelet     Status: Abnormal   Collection Time: 07/16/21  9:37 AM  Result Value Ref Range   WBC 5.7 3.4 - 10.8 x10E3/uL   RBC 3.66 (L) 3.77 - 5.28 x10E6/uL   Hemoglobin 10.5 (L) 11.1 - 15.9 g/dL   Hematocrit 32.5 (L) 34.0 - 46.6 %   MCV 89 79 - 97 fL   MCH 28.7 26.6 - 33.0 pg   MCHC 32.3 31.5 - 35.7 g/dL   RDW 13.7 11.7 - 15.4 %  Platelets 242 150 - 450 x10E3/uL   Neutrophils 59 Not Estab. %   Lymphs 21  Not Estab. %   Monocytes 12 Not Estab. %   Eos 7 Not Estab. %   Basos 1 Not Estab. %   Neutrophils Absolute 3.4 1.4 - 7.0 x10E3/uL   Lymphocytes Absolute 1.2 0.7 - 3.1 x10E3/uL   Monocytes Absolute 0.7 0.1 - 0.9 x10E3/uL   EOS (ABSOLUTE) 0.4 0.0 - 0.4 x10E3/uL   Basophils Absolute 0.0 0.0 - 0.2 x10E3/uL   Immature Granulocytes 0 Not Estab. %   Immature Grans (Abs) 0.0 0.0 - 0.1 x10E3/uL  Surgical pathology     Status: None   Collection Time: 08/02/21 11:06 AM  Result Value Ref Range   SURGICAL PATHOLOGY      SURGICAL PATHOLOGY CASE: [id number] PATIENT: Laqueta Carina Surgical Pathology Report     Specimen Submitted: A. Stomach, random; cbx  Clinical History: Gastroesophageal reflux disease without esophagitis. Finding: Gastric erosions     DIAGNOSIS: A.  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 - FRAGMENTS OF OXYNTIC MUCOSA WITH VERY MILD REACTIVE GASTROPATHY, NEGATIVE FOR INTESTINAL METAPLASIA AND ATROPHY. - NEGATIVE FOR H. PYLORI BY IMMUNOHISTOCHEMISTRY (IHC). - NEGATIVE FOR DYSPLASIA AND MALIGNANCY.  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 These samples are negative for H. pylori and the corpus mucosa shows no intestinal metaplasia, atrophy, or significant gastritis. The intestinal metaplasia likely represents an incidental finding, but clinical correlation is advised with regard to other risk fa ctors for gastric neoplasia.  IHC slides were prepared by La[facility], [place]. All controls stained appropriately.  This test was developed and its performance characteristics determined by [facility]. It has not been cleared or approved by the [name] Food and Drug Administration. The FDA does not require this test to go through premarket FDA review. This test is used for clinical purposes. It should not be  regarded as investigational or for research. This laboratory is certified under the Clinical Laboratory Improvement Amendments (CLIA) as qualified to perform high complexity clinical laboratory testing.   GROSS DESCRIPTION: A. Labeled: cbx random gastric Received: Formalin Collection time: 11:06 AM on 06/02/2022 Placed into formalin time: 11:06 AM on 06/02/2022 Tissue fragment(s): 3 Size: Aggregate, 0.7 x 0.4 x 0.1 cm Description: Tan soft tissue fragments Entirely submitted in 1 cassette.  CM 08/02/2021  Final Diagnosis performed by Bryan Lemma, MD.   Tandy Gaw cally signed 08/03/2021 6:18:43PM The electronic signature indicates that the named Attending Pathologist has evaluated the specimen Technical component performed at [facility], [address] [place], [place] [address] Lab: [phone] Dir: Rush Farmer, MD, MMM  Professional component performed at [facility], [facility], [place], [place], [place] [address] Lab: [phone] Dir: Kathi Simpers, MD     Radiology: No results found.  No results found.  MM 3D SCREEN BREAST BILATERAL  Result Date: 07/15/2021 CLINICAL DATA:  Screening. EXAM: DIGITAL SCREENING BILATERAL MAMMOGRAM WITH TOMOSYNTHESIS AND CAD TECHNIQUE: Bilateral screening digital craniocaudal and mediolateral oblique mammograms were obtained. Bilateral screening digital breast tomosynthesis was performed. The images were evaluated with computer-aided detection. COMPARISON:  Previous exam(s). ACR Breast Density Category a: The breast tissue is almost entirely fatty. FINDINGS: There are no findings suspicious for malignancy. IMPRESSION: No mammographic evidence of malignancy. A result letter of this screening mammogram will be mailed directly to the patient. RECOMMENDATION: Screening mammogram in one year. (Code:SM-B-01Y) BI-RADS  CATEGORY  1: Negative. Electronically Signed   By: Margarette Canada M.D.   On: 07/15/2021 08:55     Assessment and  Plan: Patient Active Problem List   Diagnosis Date Noted   GI bleeding 06/07/2021   CPAP use counseling 08/10/2020   Morbid obesity ([place]) 08/10/2020   GERD (gastroesophageal reflux disease) 11/07/2019   Dizziness 02/04/2019   Benign essential HTN 10/23/2018   Functional systolic murmur 40/98/1191   Developmental delay disorder 02/06/2017   Calculus of gallbladder without cholecystitis without obstruction 01/20/2017   Localized edema 04/15/2016   Microscopic hematuria 04/15/2016   OSA on CPAP 11/18/2015   Urge incontinence of urine 05/12/2015   Diverticulosis of colon 05/11/2015   Anxiety, generalized 05/11/2015    1. OSA on CPAP The patient does tolerate PAP and reports  benefit from PAP use. The patient was reminded how to clean equipment and advised to replace supplies routinely. The patient was also counselled on weight loss. The compliance is excellent. The AHI is 22.6, this is likely partly due to leak but her AHI is much higher than baseline AHI on PSG.   OSA- schedule CPAP titration to determine optimal treatment pressure. F/u 30d after set up.   2. CPAP use counseling Pt reports good compliance with CPAP therapy. Cleaning machine by hand, and changing filters and tubing as directed. Denies headaches, sinus issues, palpitations, or hemoptysis.     3. Benign essential HTN Hypertension Counseling:   The following hypertensive lifestyle modification were recommended and discussed:  1. Limiting alcohol intake to less than 1 oz/day of ethanol:(24 oz of beer or 8 oz of wine or 2 oz of 100-proof whiskey). 2. Take baby ASA 81 mg daily. 3. Importance of regular aerobic exercise and losing weight. 4. Reduce dietary saturated fat and cholesterol intake for overall cardiovascular health. 5. Maintaining adequate dietary potassium, calcium, and magnesium intake. 6. Regular monitoring of the blood pressure. 7. Reduce sodium intake to less than 100 mmol/day (less than 2.3 gm of sodium or  less than 6 gm of sodium choride)      General Counseling: I have discussed the findings of the evaluation and examination with Fajr.  I have also discussed any further diagnostic evaluation thatmay be needed or ordered today. Dorthie verbalizes understanding of the findings of todays visit. We also reviewed her medications today and discussed drug interactions and side effects including but not limited excessive drowsiness and altered mental states. We also discussed that there is always a risk not just to her but also people around her. she has been encouraged to call the office with any questions or concerns that should arise related to todays visit.  No orders of the defined types were placed in this encounter.       I have personally obtained a history, examined the patient, evaluated laboratory and imaging results, formulated the assessment and plan and placed orders. This patient was seen today by Tressie Ellis, PA-C in collaboration with Dr. Devona Konig.   Allyne Gee, MD [facility] Diplomate ABMS Pulmonary Critical Care Medicine and Sleep Medicine

## 2021-08-17 ENCOUNTER — Other Ambulatory Visit: Payer: Self-pay | Admitting: Gastroenterology

## 2021-08-17 DIAGNOSIS — R1084 Generalized abdominal pain: Secondary | ICD-10-CM

## 2021-08-30 ENCOUNTER — Other Ambulatory Visit: Payer: Self-pay | Admitting: Internal Medicine

## 2021-08-31 NOTE — Telephone Encounter (Signed)
Requested medication (s) are due for refill today: Yes  Requested medication (s) are on the active medication list: Yes  Last refill:  07/02/21  Future visit scheduled: Yes  Notes to clinic:  Unable to refill per protocol, cannot delegate.      Requested Prescriptions  Pending Prescriptions Disp Refills   meclizine (ANTIVERT) 12.5 MG tablet [Pharmacy Med Name: meclizine 12.5 mg tablet] 30 tablet 2    Sig: TAKE ONE TABLET BY MOUTH THREE TIMES DAILY AS NEEDED FOR DIZZINESS     Not Delegated - Gastroenterology: Antiemetics Failed - 08/30/2021  4:05 PM      Failed - This refill cannot be delegated      Passed - Valid encounter within last 6 months    Recent Outpatient Visits           2 months ago Gastrointestinal hemorrhage, unspecified gastrointestinal hemorrhage type   [facility] [name], MD   1 year ago Benign essential HTN   [facility] [name], MD   1 year ago Benign essential HTN   [facility] [name], MD   2 years ago Benign essential HTN   [facility] [name], MD   2 years ago Gastritis without bleeding, unspecified chronicity, unspecified gastritis type   [facility] [name], MD       Future Appointments             In 8 months Army Melia Jesse Sans, MD [facility], [facility]

## 2021-09-04 DIAGNOSIS — G4733 Obstructive sleep apnea (adult) (pediatric): Secondary | ICD-10-CM | POA: Diagnosis not present

## 2021-10-08 DIAGNOSIS — G4733 Obstructive sleep apnea (adult) (pediatric): Secondary | ICD-10-CM | POA: Diagnosis not present

## 2021-10-12 ENCOUNTER — Other Ambulatory Visit: Payer: Self-pay | Admitting: Internal Medicine

## 2021-10-12 DIAGNOSIS — K2970 Gastritis, unspecified, without bleeding: Secondary | ICD-10-CM

## 2021-10-13 NOTE — Telephone Encounter (Signed)
Requested medication (s) are due for refill today: Just filled yesterday ? ?Requested medication (s) are on the active medication list: yes ? ?Last refill:  07/02/21 #90 with 2 RF ? ?Future visit scheduled: 05/27/22 ? ?Notes to clinic:  This med is not delegated, looks like she filled it yesterday, was unsure if it was to be continued, please assess. ? ? ?  ? ?Requested Prescriptions  ?Pending Prescriptions Disp Refills  ? metoCLOPramide (REGLAN) 5 MG tablet [Pharmacy Med Name: METOCLOPRAMIDE '5MG'$  TABLETS] 90 tablet 2  ?  Sig: TAKE ONE TABLET BY MOUTH EVERY MORNING, NOON, EVENING  ?  ? Not Delegated - Gastroenterology: Antiemetics - metoclopramide Failed - 10/12/2021  9:29 AM  ?  ?  Failed - This refill cannot be delegated  ?  ?  Failed - Cr in normal range and within 360 days  ?  Creatinine  ?Date Value Ref Range Status  ?02/01/2014 1.04 0.60 - 1.30 mg/dL Final  ? ?Creatinine, Ser  ?Date Value Ref Range Status  ?06/15/2021 1.06 (H) 0.57 - 1.00 mg/dL Final  ?  ?  ?  ?  Passed - Valid encounter within last 6 months  ?  Recent Outpatient Visits   ? ?      ? 4 months ago Gastrointestinal hemorrhage, unspecified gastrointestinal hemorrhage type  ? [facility] [name], MD  ? 1 year ago Benign essential HTN  ? [facility] [name], MD  ? 2 years ago Benign essential HTN  ? [facility] [name], MD  ? 2 years ago Benign essential HTN  ? [facility] [name], MD  ? 2 years ago Gastritis without bleeding, unspecified chronicity, unspecified gastritis type  ? [facility] [name], MD  ? ?  ?  ?Future Appointments   ? ?        ? In 7 months Army Melia Jesse Sans, MD [facility] [place]  ? ?  ? ?  ?  ?  ? ? ?

## 2021-11-19 ENCOUNTER — Telehealth: Payer: Self-pay | Admitting: Gastroenterology

## 2021-11-19 DIAGNOSIS — R109 Unspecified abdominal pain: Secondary | ICD-10-CM

## 2021-11-19 DIAGNOSIS — K8080 Other cholelithiasis without obstruction: Secondary | ICD-10-CM

## 2021-11-19 NOTE — Telephone Encounter (Signed)
Recommend MRCP ?Dx: Cholelithiasis and intrahepatic biliary dilation, central abdominal pain ? ?RV ?

## 2021-11-19 NOTE — Telephone Encounter (Signed)
Patients sister called for medical adive for patient. Requesting call back.  ?

## 2021-11-19 NOTE — Telephone Encounter (Signed)
Patient sister states that on Wednesday patient had abdominal pain but the abdominal pain went a way. She then had a little bit yesterday and this morning she called her sister and said she was having abdominal pain around her belly button.. Sister is wanting to know what she can give her for the abdominal pain.  ?

## 2021-11-20 ENCOUNTER — Other Ambulatory Visit: Payer: Self-pay

## 2021-11-20 ENCOUNTER — Emergency Department: Payer: Medicare Other

## 2021-11-20 ENCOUNTER — Emergency Department
Admission: EM | Admit: 2021-11-20 | Discharge: 2021-11-20 | Disposition: A | Discharge status: Home / Self Care | Payer: Medicare Other | Attending: Student in an Organized Health Care Education/Training Program | Admitting: Student in an Organized Health Care Education/Training Program

## 2021-11-20 DIAGNOSIS — R001 Bradycardia, unspecified: Secondary | ICD-10-CM | POA: Diagnosis not present

## 2021-11-20 DIAGNOSIS — R109 Unspecified abdominal pain: Secondary | ICD-10-CM | POA: Diagnosis not present

## 2021-11-20 DIAGNOSIS — R079 Chest pain, unspecified: Secondary | ICD-10-CM | POA: Diagnosis not present

## 2021-11-20 DIAGNOSIS — I10 Essential (primary) hypertension: Secondary | ICD-10-CM | POA: Diagnosis not present

## 2021-11-20 DIAGNOSIS — R1084 Generalized abdominal pain: Secondary | ICD-10-CM | POA: Diagnosis not present

## 2021-11-20 DIAGNOSIS — R110 Nausea: Secondary | ICD-10-CM | POA: Diagnosis not present

## 2021-11-20 DIAGNOSIS — J811 Chronic pulmonary edema: Secondary | ICD-10-CM | POA: Diagnosis not present

## 2021-11-20 LAB — URINALYSIS, ROUTINE W REFLEX MICROSCOPIC
Bilirubin Urine: NEGATIVE
Glucose, UA: NEGATIVE mg/dL
Ketones, ur: 20 mg/dL — AB
Nitrite: NEGATIVE
Protein, ur: NEGATIVE mg/dL
Specific Gravity, Urine: 1.038 — ABNORMAL HIGH (ref 1.005–1.030)
pH: 5 (ref 5.0–8.0)

## 2021-11-20 LAB — CBC
HCT: 43.8 % (ref 36.0–46.0)
Hemoglobin: 12.8 g/dL (ref 12.0–15.0)
MCH: 26.1 pg (ref 26.0–34.0)
MCHC: 29.2 g/dL — ABNORMAL LOW (ref 30.0–36.0)
MCV: 89.2 fL (ref 80.0–100.0)
Platelets: 232 10*3/uL (ref 150–400)
RBC: 4.91 MIL/uL (ref 3.87–5.11)
RDW: 16.7 % — ABNORMAL HIGH (ref 11.5–15.5)
WBC: 6.6 10*3/uL (ref 4.0–10.5)
nRBC: 0 % (ref 0.0–0.2)

## 2021-11-20 LAB — COMPREHENSIVE METABOLIC PANEL
ALT: 10 U/L (ref 0–44)
AST: 14 U/L — ABNORMAL LOW (ref 15–41)
Albumin: 3.8 g/dL (ref 3.5–5.0)
Alkaline Phosphatase: 87 U/L (ref 38–126)
Anion gap: 8 (ref 5–15)
BUN: 27 mg/dL — ABNORMAL HIGH (ref 8–23)
CO2: 29 mmol/L (ref 22–32)
Calcium: 9.7 mg/dL (ref 8.9–10.3)
Chloride: 105 mmol/L (ref 98–111)
Creatinine, Ser: 1.31 mg/dL — ABNORMAL HIGH (ref 0.44–1.00)
GFR, Estimated: 43 mL/min — ABNORMAL LOW (ref >60)
Glucose, Bld: 100 mg/dL — ABNORMAL HIGH (ref 70–99)
Potassium: 3.9 mmol/L (ref 3.5–5.1)
Sodium: 142 mmol/L (ref 135–145)
Total Bilirubin: 0.7 mg/dL (ref 0.3–1.2)
Total Protein: 8 g/dL (ref 6.5–8.1)

## 2021-11-20 LAB — LIPASE, BLOOD: Lipase: 22 U/L (ref 11–51)

## 2021-11-20 LAB — TROPONIN I (HIGH SENSITIVITY)
Troponin I (High Sensitivity): 20 ng/L — ABNORMAL HIGH (ref <18)
Troponin I (High Sensitivity): 23 ng/L — ABNORMAL HIGH (ref <18)

## 2021-11-20 MED ORDER — CEPHALEXIN 500 MG PO CAPS: 500.0000 mg | ORAL_CAPSULE | Freq: Two times a day (BID) | ORAL | 0 refills | Status: AC

## 2021-11-20 MED ORDER — SODIUM CHLORIDE 0.9 % IV BOLUS
500.0000 mL | Freq: Once | INTRAVENOUS | Status: AC
  Administered 2021-11-20: 500 mL via INTRAVENOUS

## 2021-11-20 MED ORDER — LIDOCAINE VISCOUS HCL 2 % MT SOLN
15.0000 mL | Freq: Once | OROMUCOSAL | Status: AC
  Administered 2021-11-20: 15 mL via ORAL
  Filled 2021-11-20: qty 15

## 2021-11-20 MED ORDER — IOHEXOL 300 MG/ML  SOLN
80.0000 mL | Freq: Once | INTRAMUSCULAR | Status: AC | PRN
  Administered 2021-11-20: 80 mL via INTRAVENOUS
  Filled 2021-11-20: qty 80

## 2021-11-20 MED ORDER — PROBIOTIC 250 MG PO CAPS: 1.0000 | ORAL_CAPSULE | Freq: Two times a day (BID) | ORAL | 0 refills | Status: DC

## 2021-11-20 MED ORDER — ALUM & MAG HYDROXIDE-SIMETH 200-200-20 MG/5ML PO SUSP
30.0000 mL | Freq: Once | ORAL | Status: AC
  Administered 2021-11-20: 30 mL via ORAL
  Filled 2021-11-20: qty 30

## 2021-11-20 NOTE — ED Provider Notes (Signed)
? ?[facility] ?Provider Note ? ? ? Event Date/Time  ? First MD Initiated Contact with Patient 11/20/21 1107   ?  (approximate) ? ? ?History  ? ?Abdominal Pain ? ? ?HPI ? ?Taylor Johnson is a 76 y.o. female who presents to the ER for evaluation of generalized abdominal pain started 4 days ago..  Does have a history of GERD and GI bleed in the past denies any melena or hematochezia.  States the pain is not migrating does have some nausea.  Denies any dysuria no back pain. ?  ? ? ?Physical Exam  ? ?Triage Vital Signs: ?ED Triage Vitals  ?Enc Vitals Group  ?   BP 11/20/21 1051 (!) 153/69  ?   Pulse Rate 11/20/21 1051 69  ?   Resp 11/20/21 1051 16  ?   Temp 11/20/21 1051 98.5 ?F (36.9 ?C)  ?   Temp Source 11/20/21 1051 Oral  ?   SpO2 11/20/21 1051 97 %  ?   Weight 11/20/21 1048 225 lb (102.1 kg)  ?   Height 11/20/21 1048 '5\' 5"'$  (1.651 m)  ?   Head Circumference --   ?   Peak Flow --   ?   Pain Score 11/20/21 1047 10  ?   Pain Loc --   ?   Pain Edu? --   ?   Excl. in [place]? --   ? ? ?Most recent vital signs: ?Vitals:  ? 11/20/21 1341 11/20/21 1608  ?BP:  127/80  ?Pulse: 76 62  ?Resp: 16 18  ?Temp:    ?SpO2: 92% 93%  ? ? ? ?Constitutional: Alert  ?Eyes: Conjunctivae are normal.  ?Head: Atraumatic. ?Nose: No congestion/rhinnorhea. ?Mouth/Throat: Mucous membranes are moist.   ?Neck: Painless ROM.  ?Cardiovascular:   Good peripheral circulation. ?Respiratory: Normal respiratory effort.  No retractions.  ?Gastrointestinal: Soft and nontender no guarding or rebound ?Musculoskeletal:  no deformity ?Neurologic:  MAE spontaneously. No gross focal neurologic deficits are appreciated.  ?Skin:  Skin is warm, dry and intact. No rash noted. ?Psychiatric: Mood and affect are normal. Speech and behavior are normal. ? ? ? ?ED Results / Procedures / Treatments  ? ?Labs ?(all labs ordered are listed, but only abnormal results are displayed) ?Labs Reviewed  ?COMPREHENSIVE METABOLIC PANEL - Abnormal; Notable for the  following components:  ?    Result Value  ? Glucose, Bld 100 (*)   ? BUN 27 (*)   ? Creatinine, Ser 1.31 (*)   ? AST 14 (*)   ? GFR, Estimated 43 (*)   ? All other components within normal limits  ?CBC - Abnormal; Notable for the following components:  ? MCHC 29.2 (*)   ? RDW 16.7 (*)   ? All other components within normal limits  ?URINALYSIS, ROUTINE W REFLEX MICROSCOPIC - Abnormal; Notable for the following components:  ? Color, Urine YELLOW (*)   ? APPearance HAZY (*)   ? Specific Gravity, Urine 1.038 (*)   ? Hgb urine dipstick SMALL (*)   ? Ketones, ur 20 (*)   ? Leukocytes,Ua TRACE (*)   ? Bacteria, UA MANY (*)   ? All other components within normal limits  ?TROPONIN I (HIGH SENSITIVITY) - Abnormal; Notable for the following components:  ? Troponin I (High Sensitivity) 20 (*)   ? All other components within normal limits  ?TROPONIN I (HIGH SENSITIVITY) - Abnormal; Notable for the following components:  ? Troponin I (High Sensitivity) 23 (*)   ?  All other components within normal limits  ?LIPASE, BLOOD  ? ? ? ?EKG ? ?ED ECG REPORT ?I, Merlyn Lot, the attending physician, personally viewed and interpreted this ECG. ? ? Date: 11/20/2021 ? EKG Time: 12:35 ? Rate: 65 ? Rhythm: sinus ? Axis: normal ? Intervals:normal ? ST&T Change: nonspecific t wave abn, unchanged from prior morphology ? ? ? ?RADIOLOGY ?Please see ED Course for my review and interpretation. ? ?I personally reviewed all radiographic images ordered to evaluate for the above acute complaints and reviewed radiology reports and findings.  These findings were personally discussed with the patient.  Please see medical record for radiology report. ? ? ? ?PROCEDURES: ? ?Critical Care performed: No ? ?Procedures ? ? ?MEDICATIONS ORDERED IN ED: ?Medications  ?sodium chloride 0.9 % bolus 500 mL (0 mLs Intravenous Stopped 11/20/21 1337)  ?iohexol (OMNIPAQUE) 300 MG/ML solution 80 mL (80 mLs Intravenous Contrast Given 11/20/21 1212)  ?alum & mag  hydroxide-simeth (MAALOX/MYLANTA) 200-200-20 MG/5ML suspension 30 mL (30 mLs Oral Given 11/20/21 1338)  ?  And  ?lidocaine (XYLOCAINE) 2 % viscous mouth solution 15 mL (15 mLs Oral Given 11/20/21 1338)  ? ? ? ?IMPRESSION / MDM / ASSESSMENT AND PLAN / ED COURSE  ?I reviewed the triage vital signs and the nursing notes. ?             ?               ? ?Differential diagnosis includes, but is not limited to, gastritis, enteritis, diverticulitis, stone, uti, acs, pna ? ?Patient presenting with periumbilical pain as described above.  Patient is AFVSS in ED. Exam as above. Given current presentation have considered the above differential.  Will order blood work and labs to further evaluate.  She is clinically well and non toxic.   ? ?Clinical Course as of 11/20/21 1712  ?Sat Nov 20, 2021  ?1228 Chest x-ray on my review and interpretation does not show any evidence of infiltrate.  Borderline cardiomegaly.  Will await formal radiology report. CT without acute intraabdominal abnormality. Trial of GI cocktail with improvement in symptoms. [PR]  ?1636 Patient does have evidence of many bacteria given her abdominal pain will cover with antibiotics.  Patient tolerating p.o. at this point does appear stable appropriate for outpatient follow-up. [PR]  ?  ?Clinical Course User Index ?[PR] Merlyn Lot, MD  ? ? ? ?FINAL CLINICAL IMPRESSION(S) / ED DIAGNOSES  ? ?Final diagnoses:  ?Generalized abdominal pain  ? ? ? ?Rx / DC Orders  ? ?ED Discharge Orders   ? ?      Ordered  ?  cephALEXin (KEFLEX) 500 MG capsule  2 times daily       ? 11/20/21 1637  ?  Saccharomyces boulardii (PROBIOTIC) 250 MG CAPS  2 times daily       ? 11/20/21 1637  ? ?  ?  ? ?  ? ? ? ?Note:  This document was prepared using Dragon voice recognition software and may include unintentional dictation errors. ? ?  ?Merlyn Lot, MD ?11/20/21 1716 ? ?

## 2021-11-20 NOTE — ED Notes (Signed)
Lab contacted to add on trop ?

## 2021-11-20 NOTE — ED Notes (Signed)
See triage note. Pt reports generalized abd pain since Wednesday. NAD noted.  ?

## 2021-11-20 NOTE — ED Triage Notes (Addendum)
Pt to ED for intermittent periumbilical abdominal pain since 4 days ago, unable to describe quality of pain, states "it just hurts" and it feels "like it's bubbling". LBM yesterday, normal. Denies blood in stool, NVD. Denies pain with urination.  ? ?Ambulatory to triage room.  States pain is 10/10 but appears very calm at this time with no outward signs of pain noted. ? ?Hx GI bleeding and diverticulitis. ?

## 2021-11-20 NOTE — Discharge Instructions (Signed)

## 2021-11-22 ENCOUNTER — Other Ambulatory Visit: Payer: Self-pay

## 2021-11-22 ENCOUNTER — Emergency Department
Admission: EM | Admit: 2021-11-22 | Discharge: 2021-11-23 | Disposition: A | Discharge status: Home / Self Care | Payer: Medicare Other | Attending: Emergency Medicine | Admitting: Emergency Medicine

## 2021-11-22 ENCOUNTER — Emergency Department: Payer: Medicare Other

## 2021-11-22 DIAGNOSIS — D259 Leiomyoma of uterus, unspecified: Secondary | ICD-10-CM

## 2021-11-22 DIAGNOSIS — I10 Essential (primary) hypertension: Secondary | ICD-10-CM | POA: Insufficient documentation

## 2021-11-22 DIAGNOSIS — R109 Unspecified abdominal pain: Secondary | ICD-10-CM | POA: Diagnosis not present

## 2021-11-22 DIAGNOSIS — R001 Bradycardia, unspecified: Secondary | ICD-10-CM | POA: Diagnosis not present

## 2021-11-22 DIAGNOSIS — K8020 Calculus of gallbladder without cholecystitis without obstruction: Secondary | ICD-10-CM | POA: Diagnosis not present

## 2021-11-22 DIAGNOSIS — R1084 Generalized abdominal pain: Secondary | ICD-10-CM

## 2021-11-22 LAB — CBC
HCT: 40.3 % (ref 36.0–46.0)
Hemoglobin: 12.3 g/dL (ref 12.0–15.0)
MCH: 26.9 pg (ref 26.0–34.0)
MCHC: 30.5 g/dL (ref 30.0–36.0)
MCV: 88 fL (ref 80.0–100.0)
Platelets: 230 10*3/uL (ref 150–400)
RBC: 4.58 MIL/uL (ref 3.87–5.11)
RDW: 16.9 % — ABNORMAL HIGH (ref 11.5–15.5)
WBC: 5.3 10*3/uL (ref 4.0–10.5)
nRBC: 0 % (ref 0.0–0.2)

## 2021-11-22 LAB — COMPREHENSIVE METABOLIC PANEL
ALT: 9 U/L (ref 0–44)
AST: 14 U/L — ABNORMAL LOW (ref 15–41)
Albumin: 3.7 g/dL (ref 3.5–5.0)
Alkaline Phosphatase: 69 U/L (ref 38–126)
Anion gap: 5 (ref 5–15)
BUN: 19 mg/dL (ref 8–23)
CO2: 28 mmol/L (ref 22–32)
Calcium: 9.9 mg/dL (ref 8.9–10.3)
Chloride: 104 mmol/L (ref 98–111)
Creatinine, Ser: 1.04 mg/dL — ABNORMAL HIGH (ref 0.44–1.00)
GFR, Estimated: 56 mL/min — ABNORMAL LOW (ref >60)
Glucose, Bld: 116 mg/dL — ABNORMAL HIGH (ref 70–99)
Potassium: 3.5 mmol/L (ref 3.5–5.1)
Sodium: 137 mmol/L (ref 135–145)
Total Bilirubin: 0.4 mg/dL (ref 0.3–1.2)
Total Protein: 7.8 g/dL (ref 6.5–8.1)

## 2021-11-22 LAB — URINALYSIS, ROUTINE W REFLEX MICROSCOPIC
Bilirubin Urine: NEGATIVE
Glucose, UA: NEGATIVE mg/dL
Hgb urine dipstick: NEGATIVE
Ketones, ur: 5 mg/dL — AB
Nitrite: NEGATIVE
Protein, ur: NEGATIVE mg/dL
Specific Gravity, Urine: 1.021 (ref 1.005–1.030)
pH: 5 (ref 5.0–8.0)

## 2021-11-22 LAB — LIPASE, BLOOD: Lipase: 23 U/L (ref 11–51)

## 2021-11-22 LAB — TROPONIN I (HIGH SENSITIVITY): Troponin I (High Sensitivity): 22 ng/L — ABNORMAL HIGH (ref <18)

## 2021-11-22 MED ORDER — ONDANSETRON 4 MG PO TBDP
4.0000 mg | ORAL_TABLET | Freq: Once | ORAL | Status: AC
  Administered 2021-11-22: 4 mg via ORAL
  Filled 2021-11-22: qty 1

## 2021-11-22 MED ORDER — HYDROCODONE-ACETAMINOPHEN 5-325 MG PO TABS
1.0000 | ORAL_TABLET | Freq: Once | ORAL | Status: AC
  Administered 2021-11-22: 1 via ORAL
  Filled 2021-11-22: qty 1

## 2021-11-22 NOTE — ED Provider Triage Note (Signed)
Emergency Medicine Provider Triage Evaluation Note ? ?Samariah Hokenson Saini, a 76 y.o. female  was evaluated in triage.  Pt complains of nausea, vomiting, and abdominal pain persistent since Sunday.  Patient was evaluated in the ED on Sunday for similar complaints, and found to have evidence of acute abdominal pain.  Patient is scheduled for an outpatient MRCP on the 19th.  She apparently ate a hot dog she had put in the microwave prior to onset of abdominal pain and emesis.  Reports of fevers or diarrhea at this time. Patient has dementia at baseline, and is presented to the ED by her sister. ? ?Review of Systems  ?Positive: Abd pain, NV ?Negative: FCS ? ?Physical Exam  ?BP 135/70   Pulse 71   Temp 99 ?F (37.2 ?C)   Resp 17   Ht '5\' 5"'$  (1.651 m)   Wt 102.1 kg   SpO2 94%   BMI 37.44 kg/m?  ?Gen:   Awake, no distress   ?Resp:  Normal effort CTA ?MSK:   Moves extremities without difficulty  ?ABD:  Soft, nontender ? ?Medical Decision Making  ?Medically screening exam initiated at 8:05 PM.  Appropriate orders placed.  Haile Toppins Hymes was informed that the remainder of the evaluation will be completed by another provider, this initial triage assessment does not replace that evaluation, and the importance of remaining in the ED until their evaluation is complete. ? ?Geriatric patient to the ED for evaluation of ongoing abdominal pain and vomiting.  Patient had an episode of postprandial vomiting today and presents with her sister for further evaluation.  ?  ?Melvenia Needles, PA-C ?11/22/21 2009 ? ?

## 2021-11-22 NOTE — Telephone Encounter (Signed)
Order the MRCP and called central scheduling and got patient schedule for the MRCP on 12/03/2021 arrive to medical mall at 7:45am for a 8:00am scan. Nothing to eat or drink after midnight. Informed patient sister and she verbalized understanding  ?

## 2021-11-22 NOTE — Addendum Note (Signed)
Addended by: Ulyess Blossom L on: 11/22/2021 09:18 AM ? ? Modules accepted: Orders ? ?

## 2021-11-22 NOTE — ED Triage Notes (Signed)
Pt was seen on Saturday for same. Pt has lower abd pain with vomiting that has gotten worse.  ?

## 2021-11-23 NOTE — ED Provider Notes (Signed)
? ?[facility] ?Provider Note ? ? ? Event Date/Time  ? First MD Initiated Contact with Patient 11/22/21 2258   ?  (approximate) ? ? ?History  ? ?Abdominal Pain ? ? ?HPI ? ?Taylor Johnson is a 76 y.o. female that history of chronic abdominal pain, diverticulitis, GERD, hypertension who presents for evaluation of abdominal pain.  Patient is complaining of abdominal pain that she has had for over a week now.  The pain is sharp, located in the lower part of her abdomen, intermittent.  She did have some nausea earlier today, no vomiting, no diarrhea, no constipation, no dysuria, no hematuria, no chest pain, no shortness of breath.  Patient was seen here 2 days ago for the same complaint with a negative CT and was started on cephalexin for possible UTI.  Patient reports no changes in her symptoms.  Patient did see her GI doctor and has an MRCP scheduled for findings of cholelithiasis on CT scan. ?  ? ? ?Past Medical History:  ?Diagnosis Date  ? Abdominal pain 01/27/2017  ? Anxiety   ? Anxiety, generalized 05/11/2015  ? Cough 06/02/2016  ? Chronic - followed by Pulmonary  ? Cough 06/02/2016  ? Chronic - followed by Pulmonary  ? Depression   ? Developmental delay   ? Diverticulitis   ? Diverticulosis of colon 05/11/2015  ? Dysphagia 10/15/2015  ? Routine Ba Swallow normal; rec modified barium swallow   ? GERD (gastroesophageal reflux disease)   ? Hematochezia 11/07/2019  ? Hypertension   ? Hypoxia 09/12/2015  ? Leg swelling   ? Localized edema 04/15/2016  ? Microscopic hematuria 04/15/2016  ? Nausea and vomiting 01/29/2017  ? Orthostatic hypotension 03/16/2017  ? OSA on CPAP 11/18/2015  ? CPAP @ 18 cm H2O started 02/2016  ? Rectal bleeding 10/15/2018  ? Urge incontinence of urine 05/12/2015  ? ? ?Past Surgical History:  ?Procedure Laterality Date  ? ABDOMINAL HYSTERECTOMY    ? BREAST BIOPSY Left   ? neg  ? COLON SURGERY    ? COLONOSCOPY WITH PROPOFOL N/A 12/13/2019  ? Procedure: COLONOSCOPY WITH PROPOFOL;   Surgeon: Lin Landsman, MD;  Location[facility] END[facility]COPY;  Service: Endoscopy;  Laterality: N/A;  ? ESOPHAGOGASTRODUODENOSCOPY (EGD) WITH PROPOFOL N/A 01/29/2017  ? Procedure: ESOPHAGOGASTRODUODENOSCOPY (EGD) WITH PROPOFOL;  Surgeon: Wilford Corner, MD;  Location: [facility] ENDOSCOPY;  Service: Endoscopy;  Laterality: N/A;  ? ESOPHAGOGASTRODUODENOSCOPY (EGD) WITH PROPOFOL N/A 08/02/2021  ? Procedure: ESOPHAGOGASTRODUODENOSCOPY (EGD) WITH PROPOFOL;  Surgeon: Lin Landsman, MD;  Location: [facility] ENDOSCOPY;  Service: Gastroenterology;  Laterality: N/A;  ? ? ? ?Physical Exam  ? ?Triage Vital Signs: ?ED Triage Vitals  ?Enc Vitals Group  ?   BP 11/22/21 1952 135/70  ?   Pulse Rate 11/22/21 1952 71  ?   Resp 11/22/21 1952 17  ?   Temp 11/22/21 1952 99 ?F (37.2 ?C)  ?   Temp Source 11/22/21 1951 Oral  ?   SpO2 11/22/21 1952 94 %  ?   Weight 11/22/21 1953 225 lb (102.1 kg)  ?   Height 11/22/21 1953 '5\' 5"'$  (1.651 m)  ?   Head Circumference --   ?   Peak Flow --   ?   Pain Score 11/22/21 1952 8  ?   Pain Loc --   ?   Pain Edu? --   ?   Excl. in [place]? --   ? ? ?Most recent vital signs: ?Vitals:  ? 11/23/21 0000 11/23/21 0045  ?  BP:  (!) 160/68  ?Pulse: (!) 48 (!) 49  ?Resp: (!) 21 18  ?Temp:    ?SpO2: 100% 100%  ? ? ? ?Constitutional: Alert and oriented. Well appearing and in no apparent distress. ?HEENT: ?     Head: Normocephalic and atraumatic.    ?     Eyes: Conjunctivae are normal. Sclera is non-icteric.  ?     Mouth/Throat: Mucous membranes are moist.  ?     Neck: Supple with no signs of meningismus. ?Cardiovascular: Regular rate and rhythm. No murmurs, gallops, or rubs. 2+ symmetrical distal pulses are present in all extremities.  ?Respiratory: Normal respiratory effort. Lungs are clear to auscultation bilaterally.  ?Gastrointestinal: Soft, mild suprapubic tenderness, and non distended with positive bowel sounds. No rebound or guarding. ?Genitourinary: No CVA tenderness. ?Musculoskeletal:  No edema, cyanosis, or erythema  of extremities. ?Neurologic: Normal speech and language. Face is symmetric. Moving all extremities. No gross focal neurologic deficits are appreciated. ?Skin: Skin is warm, dry and intact. No rash noted. ?Psychiatric: Mood and affect are normal. Speech and behavior are normal. ? ?ED Results / Procedures / Treatments  ? ?Labs ?(all labs ordered are listed, but only abnormal results are displayed) ?Labs Reviewed  ?COMPREHENSIVE METABOLIC PANEL - Abnormal; Notable for the following components:  ?    Result Value  ? Glucose, Bld 116 (*)   ? Creatinine, Ser 1.04 (*)   ? AST 14 (*)   ? GFR, Estimated 56 (*)   ? All other components within normal limits  ?CBC - Abnormal; Notable for the following components:  ? RDW 16.9 (*)   ? All other components within normal limits  ?URINALYSIS, ROUTINE W REFLEX MICROSCOPIC - Abnormal; Notable for the following components:  ? Color, Urine YELLOW (*)   ? APPearance HAZY (*)   ? Ketones, ur 5 (*)   ? Leukocytes,Ua TRACE (*)   ? Bacteria, UA RARE (*)   ? All other components within normal limits  ?TROPONIN I (HIGH SENSITIVITY) - Abnormal; Notable for the following components:  ? Troponin I (High Sensitivity) 22 (*)   ? All other components within normal limits  ?LIPASE, BLOOD  ? ? ? ?EKG ? ?ED ECG REPORT ?I, Rudene Re, the attending physician, personally viewed and interpreted this ECG. ? ?Sinus bradycardia with a rate of 49, normal intervals, T wave inversions in inferior and lateral leads which are present in prior EKGs but more pronounced today ? ?RADIOLOGY ?I, Rudene Re, attending MD, have personally viewed and interpreted the images obtained during this visit as below: ? ?Ultrasound is negative for cholecystitis ? ? ?___________________________________________________ ?Interpretation by Radiologist:  ?US ABDOMEN LIMITED RUQ (LIVER/GB) ? ?Result Date: 11/23/2021 ?CLINICAL DATA:  Abdominal pain EXAM: ULTRASOUND ABDOMEN LIMITED RIGHT UPPER QUADRANT COMPARISON:  01/04/2017.   CT 11/20/2021 FINDINGS: Gallbladder: 1.3 cm gallstone.  No wall thickening or sonographic Murphy sign. Common bile duct: Diameter: Normal caliber, 5 mm Liver: No focal lesion identified. Within normal limits in parenchymal echogenicity. Portal vein is patent on color Doppler imaging with normal direction of blood flow towards the liver. Other: None. IMPRESSION: Cholelithiasis.  No sonographic evidence of acute cholecystitis. Electronically Signed   By: Rolm Baptise M.D.   On: 11/23/2021 00:10   ?' ? ? ? ?PROCEDURES: ? ?Critical Care performed: No ? ?Procedures ? ? ? ?IMPRESSION / MDM / ASSESSMENT AND PLAN / ED COURSE  ?I reviewed the triage vital signs and the nursing notes. ? ?76 y.o. female that history  of chronic abdominal pain, diverticulitis, GERD, hypertension who presents for evaluation of abdominal pain.  Patient with a history of chronic abdominal pain with several prior visits to the emergency room for similar complaints who presents for evaluation of lower abdominal pain.  Patient is well-appearing in no distress with stable vital signs, abdomen is soft with mild suprapubic tenderness, no rebound or guarding.  Review of patient's prior visits, GI notes, and imaging studies shows that patient has diverticulosis, cholelithiasis, and a uterine fibroid.  She is currently on antibiotics for a possible UTI that was diagnosed on a visit to the ED 2 days ago for the similar complaint.  Her evaluation today shows a UA with no signs of UTI.  Normal LFTs and lipase, no significant electrolyte derangements or AKI, no leukocytosis.  EKG and troponin are unchanged when compared to prior.  Ultrasound shows cholelithiasis with no evidence of cholecystitis.  Patient has an appointment with her GI doctor for close follow-up.  We will also refer her to GYN since her pain could be coming from her known uterine fibroid.  But with essentially unremarkable work-up I feel the patient is now safe for discharge home. ? ?Ddx:  GERD, acute on chronic abdominal pain, cholecystitis, UTI, diverticulitis, appendicitis, pyelonephritis, kidney stone, uterine fibroid ? ? ?MEDICATIONS GIVEN IN ED: ?Medications  ?ondansetron (ZOFRAN-OD

## 2021-11-29 ENCOUNTER — Telehealth: Payer: Self-pay

## 2021-11-29 NOTE — Telephone Encounter (Signed)
Tried calling Taylor Johnson back. Mailbox is full and cannot leave msg.  ? ?Patient is seeing Dr Humphrey Rolls- Pum[facility]ology for her sleep issues, not [name]. She is already on a pap nightly. ?

## 2021-11-29 NOTE — Telephone Encounter (Signed)
Copied from [facility] [id number]. Topic: General - Other ?>> Nov 29, 2021 12:19 PM Valere Dross wrote: ?Reason for CRM: Melissa from [facility] called in stating they are still needing some information sent over on the pt like the Bi Paps, showing that it failed, and a corrected diagnoses code on the order faxed to [phone]. Please advise. ?

## 2021-12-03 ENCOUNTER — Ambulatory Visit
Admission: RE | Admit: 2021-12-03 | Discharge: 2021-12-03 | Disposition: A | Discharge status: Home / Self Care | Payer: Medicare Other | Source: Ambulatory Visit | Attending: Gastroenterology | Admitting: Gastroenterology

## 2021-12-03 ENCOUNTER — Other Ambulatory Visit: Payer: Self-pay | Admitting: Gastroenterology

## 2021-12-03 DIAGNOSIS — K8020 Calculus of gallbladder without cholecystitis without obstruction: Secondary | ICD-10-CM | POA: Diagnosis not present

## 2021-12-03 DIAGNOSIS — R109 Unspecified abdominal pain: Secondary | ICD-10-CM | POA: Insufficient documentation

## 2021-12-03 DIAGNOSIS — K8080 Other cholelithiasis without obstruction: Secondary | ICD-10-CM | POA: Insufficient documentation

## 2021-12-03 DIAGNOSIS — K5730 Diverticulosis of large intestine without perforation or abscess without bleeding: Secondary | ICD-10-CM | POA: Diagnosis not present

## 2021-12-03 DIAGNOSIS — R932 Abnormal findings on diagnostic imaging of liver and biliary tract: Secondary | ICD-10-CM | POA: Diagnosis not present

## 2021-12-03 DIAGNOSIS — I517 Cardiomegaly: Secondary | ICD-10-CM | POA: Diagnosis not present

## 2021-12-03 MED ORDER — GADOBUTROL 1 MMOL/ML IV SOLN
10.0000 mL | Freq: Once | INTRAVENOUS | Status: AC | PRN
  Administered 2021-12-03: 10 mL via INTRAVENOUS

## 2021-12-06 ENCOUNTER — Other Ambulatory Visit: Payer: Self-pay

## 2021-12-06 ENCOUNTER — Encounter: Payer: Self-pay | Admitting: Emergency Medicine

## 2021-12-06 DIAGNOSIS — K8020 Calculus of gallbladder without cholecystitis without obstruction: Secondary | ICD-10-CM | POA: Diagnosis not present

## 2021-12-06 DIAGNOSIS — K8070 Calculus of gallbladder and bile duct without cholecystitis without obstruction: Secondary | ICD-10-CM | POA: Diagnosis not present

## 2021-12-06 DIAGNOSIS — R001 Bradycardia, unspecified: Secondary | ICD-10-CM | POA: Diagnosis not present

## 2021-12-06 DIAGNOSIS — R109 Unspecified abdominal pain: Secondary | ICD-10-CM | POA: Diagnosis present

## 2021-12-06 DIAGNOSIS — I10 Essential (primary) hypertension: Secondary | ICD-10-CM | POA: Diagnosis not present

## 2021-12-06 LAB — CBC
HCT: 40.8 % (ref 36.0–46.0)
Hemoglobin: 12.4 g/dL (ref 12.0–15.0)
MCH: 26.9 pg (ref 26.0–34.0)
MCHC: 30.4 g/dL (ref 30.0–36.0)
MCV: 88.5 fL (ref 80.0–100.0)
Platelets: 200 10*3/uL (ref 150–400)
RBC: 4.61 MIL/uL (ref 3.87–5.11)
RDW: 17.3 % — ABNORMAL HIGH (ref 11.5–15.5)
WBC: 5.2 10*3/uL (ref 4.0–10.5)
nRBC: 0 % (ref 0.0–0.2)

## 2021-12-06 LAB — COMPREHENSIVE METABOLIC PANEL
ALT: 10 U/L (ref 0–44)
AST: 13 U/L — ABNORMAL LOW (ref 15–41)
Albumin: 3.5 g/dL (ref 3.5–5.0)
Alkaline Phosphatase: 73 U/L (ref 38–126)
Anion gap: 7 (ref 5–15)
BUN: 24 mg/dL — ABNORMAL HIGH (ref 8–23)
CO2: 28 mmol/L (ref 22–32)
Calcium: 10 mg/dL (ref 8.9–10.3)
Chloride: 104 mmol/L (ref 98–111)
Creatinine, Ser: 0.84 mg/dL (ref 0.44–1.00)
GFR, Estimated: >60 mL/min (ref >60)
Glucose, Bld: 103 mg/dL — ABNORMAL HIGH (ref 70–99)
Potassium: 3.6 mmol/L (ref 3.5–5.1)
Sodium: 139 mmol/L (ref 135–145)
Total Bilirubin: 0.5 mg/dL (ref 0.3–1.2)
Total Protein: 7.2 g/dL (ref 6.5–8.1)

## 2021-12-06 LAB — LIPASE, BLOOD: Lipase: 18 U/L (ref 11–51)

## 2021-12-06 NOTE — ED Provider Triage Note (Signed)
Emergency Medicine Provider Triage Evaluation Note  Taylor Johnson , a 76 y.o. female  was evaluated in triage.  Pt complains of lower abdominal pain bilateral lower quadrants.  Patient presents emergency department for ongoing abdominal pain.  Patient has been seen here recently, was recently admitted and had an MRI but has also had ultrasound and CTs recently.  No significant changes from previous presentation other than pain is still ongoing..  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  Again no new symptoms, just persisting abdominal pain.  Review of Systems  Positive: Lower abdominal pain Negative: Diarrhea, constipation, blood in stools, nausea, vomiting, urinary changes  Physical Exam  BP (!) 149/59   Pulse 67   Temp 98.7 F (37.1 C) (Oral)   Resp 18   Ht '5\' 5"'$  (1.651 m)   SpO2 97%   BMI 37.44 kg/m  Gen:   Awake, no distress   Resp:  Normal effort  MSK:   Moves extremities without difficulty  Other:    Medical Decision Making  Medically screening exam initiated at 8:51 PM.  Appropriate orders placed.  Taylor Johnson was informed that the remainder of the evaluation will be completed by another provider, this initial triage assessment does not replace that evaluation, and the importance of remaining in the ED until their evaluation is complete.  Patient presents to the ED with ongoing abdominal pain.  Patient has been seen several times recently for similar symptoms.  There is no new symptoms however symptoms are still ongoing.  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  No reported fever.  At this time we will start with labs and urinalysis.   Darletta Moll, PA-C 12/06/21 2051

## 2021-12-06 NOTE — ED Triage Notes (Signed)
Pt here for lower abdominal pain. Denies N/V/D. Pt last had BM today with no relief in pain.

## 2021-12-07 ENCOUNTER — Other Ambulatory Visit: Payer: Self-pay

## 2021-12-07 ENCOUNTER — Telehealth: Payer: Self-pay

## 2021-12-07 ENCOUNTER — Inpatient Hospital Stay: Payer: Medicare Other | Admitting: Internal Medicine

## 2021-12-07 ENCOUNTER — Emergency Department
Admission: EM | Admit: 2021-12-07 | Discharge: 2021-12-07 | Disposition: A | Discharge status: Home / Self Care | Payer: Medicare Other | Attending: Emergency Medicine | Admitting: Emergency Medicine

## 2021-12-07 DIAGNOSIS — K8020 Calculus of gallbladder without cholecystitis without obstruction: Secondary | ICD-10-CM

## 2021-12-07 DIAGNOSIS — G4733 Obstructive sleep apnea (adult) (pediatric): Secondary | ICD-10-CM

## 2021-12-07 DIAGNOSIS — G8929 Other chronic pain: Secondary | ICD-10-CM

## 2021-12-07 DIAGNOSIS — K8050 Calculus of bile duct without cholangitis or cholecystitis without obstruction: Secondary | ICD-10-CM

## 2021-12-07 DIAGNOSIS — K8021 Calculus of gallbladder without cholecystitis with obstruction: Secondary | ICD-10-CM

## 2021-12-07 DIAGNOSIS — K829 Disease of gallbladder, unspecified: Secondary | ICD-10-CM

## 2021-12-07 NOTE — Discharge Instructions (Addendum)
As we discussed , Taylor Johnson's evaluation is again reassuring, and based on all of her prior evaluations and imaging, it is likely that a gallstone is causing her persistent and recurrent abdominal pain.  We recommend you follow-up with her primary care doctor, but you may also consider following up with Taylor Johnson or one of his colleagues in general surgery to discuss whether or not she would benefit from having her gallbladder removed.  In the meantime, please read through the included information about biliary colic and the "gallbladder eating plan".  Try using over-the-counter ibuprofen and/or Tylenol according to the label instructions.  If you feel she is getting constipated, we strongly encourage you to give her a dose of MiraLAX daily according to the label instructions as well.    Return to the emergency department if you develop new or worsening symptoms that concern you.

## 2021-12-07 NOTE — Telephone Encounter (Signed)
-----   Message from Lin Landsman, MD sent at 12/06/2021  8:28 PM EDT ----- Caryl Pina  Please inform patient that MRI of her abdomen shows that she has a stone in the neck of her gallbladder which could explain her abdominal pain.  Recommend referral to Dr. Dahlia Byes to evaluate for cholecystectomy  Thanks  RV

## 2021-12-07 NOTE — Telephone Encounter (Signed)
Called and left a message for call back  

## 2021-12-07 NOTE — ED Notes (Signed)
Dr Forbach at the bedside. 

## 2021-12-07 NOTE — ED Provider Notes (Signed)
[facility] Provider Note    Event Date/Time   First MD Initiated Contact with Patient 12/07/21 [phone]     (approximate)   History   Abdominal Pain   HPI Level 5 caveat: Patient has a documented "developmental delay" and her sister provides most of the history.  Taylor Johnson is a 76 y.o. female whose medical history includes developmental delay, gallstone, chronic abdominal pain, and morbid obesity.  She presents tonight for abdominal pain.  She does not feel it at this time and is not able to tell me specifically where it was hurting.  She has not had any nausea, vomiting, nor diarrhea.  She had a bowel movement today but that did not seem to help the pain.  This is similar to episodes she has had multiple times in the past.  This is her fourth emergency department in 6 months and she has been seeing her primary care doctor who ordered an MRCP for her and which was performed about 3 days ago but they do not yet have the results.  No fever, no dysuria.     Physical Exam   Triage Vital Signs: ED Triage Vitals  Enc Vitals Group     BP 12/06/21 2041 (!) 149/59     Pulse Rate 12/06/21 2041 67     Resp 12/06/21 2041 18     Temp 12/06/21 2041 98.7 F (37.1 C)     Temp Source 12/06/21 2041 Oral     SpO2 12/06/21 2041 97 %     Weight --      Height 12/06/21 2047 1.651 m ('5\' 5"'$ )     Head Circumference --      Peak Flow --      Pain Score 12/06/21 2047 6     Pain Loc --      Pain Edu? --      Excl. in [place]? --     Most recent vital signs: Vitals:   12/06/21 2041 12/07/21 0115  BP: (!) 149/59 (!) 129/57  Pulse: 67 (!) 51  Resp: 18 19  Temp: 98.7 F (37.1 C)   SpO2: 97% 97%     General: Awake, no distress.  Calm, cooperative. CV:  Good peripheral perfusion.  Resp:  Normal effort.  Breathing comfortably, no accessory muscle usage. Abd:  Morbid obesity.  No distention.  No tenderness to palpation in the epigastrium, right upper quadrant, nor lower  abdomen.  She initially reacts just slightly to suprapubic palpation, but with persistent palpation and distraction she does not indicate any pain.   ED Results / Procedures / Treatments   Labs (all labs ordered are listed, but only abnormal results are displayed) Labs Reviewed  COMPREHENSIVE METABOLIC PANEL - Abnormal; Notable for the following components:      Result Value   Glucose, Bld 103 (*)    BUN 24 (*)    AST 13 (*)    All other components within normal limits  CBC - Abnormal; Notable for the following components:   RDW 17.3 (*)    All other components within normal limits  LIPASE, BLOOD  URINALYSIS, ROUTINE W REFLEX MICROSCOPIC     EKG  ED ECG REPORT I, Hinda Kehr, the attending physician, personally viewed and interpreted this ECG.  Date: 12/06/2021 EKG Time: 20: 41 Rate: 68 Rhythm: normal sinus rhythm QRS Axis: normal Intervals: normal ST/T Wave abnormalities: Non-specific ST segment / T-wave changes, but no clear evidence of acute ischemia. Narrative Interpretation:  no definitive evidence of acute ischemia; does not meet STEMI criteria.    PROCEDURES:  Critical Care performed: No  Procedures   MEDICATIONS ORDERED IN ED: Medications - No data to display   IMPRESSION / MDM / [place] / ED COURSE  I reviewed the triage vital signs and the nursing notes.                              Differential diagnosis includes, but is not limited to, biliary colic, cholecystitis, SBO, ileus, constipation, mesenteric ischemia, other acute intra-abdominal infection.  Patient's vital signs are stable and within normal limits other than mild bradycardia.  I interpreted her EKG and there is no evidence of ischemia or concerning interval changes.  Labs ordered include CMP, CBC, lipase.  Her results are reassuring with no leukocytosis and no evidence of biliary obstruction or renal dysfunction.  I reviewed her medical record including prior ED visits.  I  personally viewed and interpreted her MRCP and I can see no evidence of obstruction or cholecystitis.  The radiologist report confirms that there is a gallstone but no evidence of obstruction although they recommended clinical correlation with labs and/or with ERCP.  I also interpreted the ultrasound from about 2 weeks ago and identified the cholelithiasis.  Patient's presentation is most consistent with exacerbation of chronic illness.  Given her well appearance, minimal symptoms, reassuring physical exam, reassuring vital signs, and reassuring labs, I do not believe she needs to be admitted at this time.  I discussed at some length with the patient's sister and the patient her results and explained that I understand it is frustrating for her to continue to have discomfort.  I encouraged close follow-up as an outpatient with her primary care doctor, but I am also giving her information about how to contact surgery to discuss whether or not she would benefit from cholecystectomy and the risks and benefits associated with such a procedure.  I encouraged use of over-the-counter ibuprofen and/or Tylenol, and also encouraged the use of MiraLAX if the patient seems to be getting constipated.  I gave my usual customary return precautions.  The patient's sister understands and agrees with the plan.         FINAL CLINICAL IMPRESSION(S) / ED DIAGNOSES   Final diagnoses:  Chronic abdominal pain  Calculus of gallbladder without cholecystitis without obstruction  Biliary colic     Rx / DC Orders   ED Discharge Orders     None        Note:  This document was prepared using Dragon voice recognition software and may include unintentional dictation errors.   Hinda Kehr, MD 12/07/21 [phone]

## 2021-12-08 NOTE — Telephone Encounter (Signed)
Called patient sister  and she verbalized understanding and would like referral. Placed referral

## 2021-12-09 ENCOUNTER — Emergency Department
Admission: EM | Admit: 2021-12-09 | Discharge: 2021-12-10 | Disposition: A | Discharge status: Home / Self Care | Payer: Medicare Other | Attending: Student in an Organized Health Care Education/Training Program | Admitting: Student in an Organized Health Care Education/Training Program

## 2021-12-09 ENCOUNTER — Emergency Department: Payer: Medicare Other

## 2021-12-09 ENCOUNTER — Other Ambulatory Visit: Payer: Self-pay

## 2021-12-09 DIAGNOSIS — R1033 Periumbilical pain: Secondary | ICD-10-CM | POA: Insufficient documentation

## 2021-12-09 DIAGNOSIS — R1084 Generalized abdominal pain: Secondary | ICD-10-CM | POA: Insufficient documentation

## 2021-12-09 DIAGNOSIS — R1011 Right upper quadrant pain: Secondary | ICD-10-CM | POA: Diagnosis not present

## 2021-12-09 DIAGNOSIS — K8020 Calculus of gallbladder without cholecystitis without obstruction: Secondary | ICD-10-CM

## 2021-12-09 DIAGNOSIS — R109 Unspecified abdominal pain: Secondary | ICD-10-CM | POA: Diagnosis not present

## 2021-12-09 DIAGNOSIS — R001 Bradycardia, unspecified: Secondary | ICD-10-CM | POA: Diagnosis not present

## 2021-12-09 DIAGNOSIS — K8050 Calculus of bile duct without cholangitis or cholecystitis without obstruction: Secondary | ICD-10-CM

## 2021-12-09 LAB — CBC WITH DIFFERENTIAL/PLATELET
Abs Immature Granulocytes: 0.01 10*3/uL (ref 0.00–0.07)
Basophils Absolute: 0 10*3/uL (ref 0.0–0.1)
Basophils Relative: 0 %
Eosinophils Absolute: 0.3 10*3/uL (ref 0.0–0.5)
Eosinophils Relative: 7 %
HCT: 41.6 % (ref 36.0–46.0)
Hemoglobin: 12.2 g/dL (ref 12.0–15.0)
Immature Granulocytes: 0 %
Lymphocytes Relative: 28 %
Lymphs Abs: 1.3 10*3/uL (ref 0.7–4.0)
MCH: 26.6 pg (ref 26.0–34.0)
MCHC: 29.3 g/dL — ABNORMAL LOW (ref 30.0–36.0)
MCV: 90.8 fL (ref 80.0–100.0)
Monocytes Absolute: 0.5 10*3/uL (ref 0.1–1.0)
Monocytes Relative: 10 %
Neutro Abs: 2.4 10*3/uL (ref 1.7–7.7)
Neutrophils Relative %: 55 %
Platelets: 161 10*3/uL (ref 150–400)
RBC: 4.58 MIL/uL (ref 3.87–5.11)
RDW: 17.3 % — ABNORMAL HIGH (ref 11.5–15.5)
WBC: 4.5 10*3/uL (ref 4.0–10.5)
nRBC: 0 % (ref 0.0–0.2)

## 2021-12-09 LAB — COMPREHENSIVE METABOLIC PANEL
ALT: 9 U/L (ref 0–44)
AST: 15 U/L (ref 15–41)
Albumin: 3.6 g/dL (ref 3.5–5.0)
Alkaline Phosphatase: 71 U/L (ref 38–126)
Anion gap: 9 (ref 5–15)
BUN: 18 mg/dL (ref 8–23)
CO2: 27 mmol/L (ref 22–32)
Calcium: 9.4 mg/dL (ref 8.9–10.3)
Chloride: 104 mmol/L (ref 98–111)
Creatinine, Ser: 0.83 mg/dL (ref 0.44–1.00)
GFR, Estimated: >60 mL/min (ref >60)
Glucose, Bld: 107 mg/dL — ABNORMAL HIGH (ref 70–99)
Potassium: 4.1 mmol/L (ref 3.5–5.1)
Sodium: 140 mmol/L (ref 135–145)
Total Bilirubin: 0.5 mg/dL (ref 0.3–1.2)
Total Protein: 7.3 g/dL (ref 6.5–8.1)

## 2021-12-09 LAB — URINALYSIS, ROUTINE W REFLEX MICROSCOPIC
Bacteria, UA: NONE SEEN
Bilirubin Urine: NEGATIVE
Glucose, UA: NEGATIVE mg/dL
Ketones, ur: NEGATIVE mg/dL
Leukocytes,Ua: NEGATIVE
Nitrite: NEGATIVE
Protein, ur: NEGATIVE mg/dL
Specific Gravity, Urine: 1.02 (ref 1.005–1.030)
pH: 5 (ref 5.0–8.0)

## 2021-12-09 LAB — LIPASE, BLOOD: Lipase: 20 U/L (ref 11–51)

## 2021-12-09 MED ORDER — ACETAMINOPHEN 325 MG PO TABS
650.0000 mg | ORAL_TABLET | Freq: Once | ORAL | Status: AC
  Administered 2021-12-09: 650 mg via ORAL
  Filled 2021-12-09: qty 2

## 2021-12-09 NOTE — ED Triage Notes (Signed)
Pt presents to ER from home c/o periumbilical abd pain that has been intermittent in nature.  Pt states this episode started sometime this afternoon.  Pt states she was seen here and told her that it was her gall bladder.  Pt denies n/v/d.  Pt states last BM was today and was normal.  Pt is A&O x4 at this time in NAD.

## 2021-12-09 NOTE — ED Provider Notes (Addendum)
[facility] Provider Note    Event Date/Time   First MD Initiated Contact with Patient 12/09/21 2209     (approximate)   History   Abdominal Pain   HPI  Taylor Johnson is a 76 y.o. female extensive past medical history with recent diagnosis of cholelithiasis presents to the ER for evaluation of generalized abdominal pain primarily periumbilical and right upper quadrant.  Feels like her stomach has been "bubbling.  "No measured fevers.  No vomiting.  Took Tylenol this morning for pain.  Denies any dysuria no flank pain.     Physical Exam   Triage Vital Signs: ED Triage Vitals  Enc Vitals Group     BP 12/09/21 2123 110/75     Pulse Rate 12/09/21 2123 63     Resp 12/09/21 2304 16     Temp 12/09/21 2123 98.3 F (36.8 C)     Temp Source 12/09/21 2123 Oral     SpO2 12/09/21 2123 95 %     Weight 12/09/21 2126 227 lb 1.2 oz (103 kg)     Height 12/09/21 2126 '5\' 5"'$  (1.651 m)     Head Circumference --      Peak Flow --      Pain Score --      Pain Loc --      Pain Edu? --      Excl. in [place]? --     Most recent vital signs: Vitals:   12/09/21 2304 12/10/21 0000  BP: (!) 147/74 (!) 160/59  Pulse: 63 (!) 46  Resp: 16 18  Temp:    SpO2: 98% 97%     Constitutional: Alert  Eyes: Conjunctivae are normal.  Head: Atraumatic. Nose: No congestion/rhinnorhea. Mouth/Throat: Mucous membranes are moist.   Neck: Painless ROM.  Cardiovascular:   Good peripheral circulation. Respiratory: Normal respiratory effort.  No retractions.  Gastrointestinal: Soft and nontender.  Musculoskeletal:  no deformity Neurologic:  MAE spontaneously. No gross focal neurologic deficits are appreciated.  Skin:  Skin is warm, dry and intact. No rash noted. Psychiatric: Mood and affect are normal. Speech and behavior are normal.    ED Results / Procedures / Treatments   Labs (all labs ordered are listed, but only abnormal results are displayed) Labs Reviewed  CBC WITH  DIFFERENTIAL/PLATELET - Abnormal; Notable for the following components:      Result Value   MCHC 29.3 (*)    RDW 17.3 (*)    All other components within normal limits  COMPREHENSIVE METABOLIC PANEL - Abnormal; Notable for the following components:   Glucose, Bld 107 (*)    All other components within normal limits  URINALYSIS, ROUTINE W REFLEX MICROSCOPIC - Abnormal; Notable for the following components:   Color, Urine YELLOW (*)    APPearance CLEAR (*)    Hgb urine dipstick SMALL (*)    All other components within normal limits  LIPASE, BLOOD  TROPONIN I (HIGH SENSITIVITY)     EKG     RADIOLOGY Please see ED Course for my review and interpretation.  I personally reviewed all radiographic images ordered to evaluate for the above acute complaints and reviewed radiology reports and findings.  These findings were personally discussed with the patient.  Please see medical record for radiology report.    PROCEDURES:  Critical Care performed:   Procedures   MEDICATIONS ORDERED IN ED: Medications  acetaminophen (TYLENOL) tablet 650 mg (650 mg Oral Given 12/09/21 2315)     IMPRESSION /  MDM / ASSESSMENT AND PLAN / ED COURSE  I reviewed the triage vital signs and the nursing notes.                              Differential diagnosis includes, but is not limited to, cholelithiasis, cholecystitis, cholangitis, pancreatitis, enteritis, gastritis, constipation,  Patient presented to the ER for evaluation of symptoms as described above.  Her abdominal exam is benign.  She is complaining of some periumbilical and right upper quadrant pain.  Blood work will be sent for the blood differential.  Will order repeat right upper quadrant ultrasound.  Patient be signed out to oncoming physician pending follow-up imaging and blood work results.  Patient reassessed.  Her ultrasound is reassuring with persistent cholelithiasis but no cholecystitis.  She feels improved after Tylenol.  Noted to  be mildly bradycardic.  Will check EKG as well is tropes but if these are stable or negative I do believe she will be appropriate for outpatient follow-up.    Patient's presentation is most consistent with acute presentation with potential threat to life or bodily function.   FINAL CLINICAL IMPRESSION(S) / ED DIAGNOSES   Final diagnoses:  Generalized abdominal pain     Rx / DC Orders   ED Discharge Orders     None        Note:  This document was prepared using Dragon voice recognition software and may include unintentional dictation errors.    Merlyn Lot, MD 12/09/21 0630    Merlyn Lot, MD 12/10/21 [phone]

## 2021-12-10 DIAGNOSIS — R109 Unspecified abdominal pain: Secondary | ICD-10-CM | POA: Diagnosis not present

## 2021-12-10 DIAGNOSIS — R1084 Generalized abdominal pain: Secondary | ICD-10-CM | POA: Diagnosis not present

## 2021-12-10 DIAGNOSIS — K8020 Calculus of gallbladder without cholecystitis without obstruction: Secondary | ICD-10-CM | POA: Diagnosis not present

## 2021-12-10 LAB — TROPONIN I (HIGH SENSITIVITY): Troponin I (High Sensitivity): 10 ng/L (ref <18)

## 2021-12-10 MED ORDER — ONDANSETRON 4 MG PO TBDP: 4.0000 mg | ORAL_TABLET | Freq: Three times a day (TID) | ORAL | 0 refills | Status: DC | PRN

## 2021-12-10 NOTE — ED Provider Notes (Signed)
-----------------------------------------   1:44 AM on 12/10/2021 -----------------------------------------   Troponin negative.  EKG shows sinus bradycardia rate of 49, nonspecific changes.  Patient is overall feeling better.  Denies abdominal pain, nausea/vomiting, chest pain or shortness of breath.  Discharged home with prescription for Zofran to use as needed; patient will take Tylenol as needed for pain.  Strict return precautions given.  Patient and family member verbalized understanding and agree with plan of care.   Paulette Blanch, MD 12/10/21 [phone]

## 2021-12-10 NOTE — Discharge Instructions (Addendum)
You may take Zofran as needed for nausea, Tylenol as needed for pain.  Turn to the ER for worsening symptoms, persistent vomiting, difficulty breathing or other concerns.

## 2021-12-14 DIAGNOSIS — R1011 Right upper quadrant pain: Secondary | ICD-10-CM | POA: Diagnosis not present

## 2021-12-15 ENCOUNTER — Other Ambulatory Visit: Payer: Self-pay | Admitting: Surgery

## 2021-12-15 DIAGNOSIS — R109 Unspecified abdominal pain: Secondary | ICD-10-CM

## 2021-12-16 ENCOUNTER — Inpatient Hospital Stay: Payer: Medicare Other

## 2021-12-16 ENCOUNTER — Inpatient Hospital Stay
Admission: EM | Admit: 2021-12-16 | Discharge: 2021-12-23 | DRG: 392 | Disposition: A | Discharge status: Home / Self Care | Payer: Medicare Other | Attending: Internal Medicine | Admitting: Internal Medicine

## 2021-12-16 ENCOUNTER — Other Ambulatory Visit: Payer: Self-pay

## 2021-12-16 ENCOUNTER — Emergency Department: Payer: Medicare Other

## 2021-12-16 DIAGNOSIS — K8050 Calculus of bile duct without cholangitis or cholecystitis without obstruction: Principal | ICD-10-CM

## 2021-12-16 DIAGNOSIS — F32A Depression, unspecified: Secondary | ICD-10-CM | POA: Diagnosis not present

## 2021-12-16 DIAGNOSIS — Z6839 Body mass index (BMI) 39.0-39.9, adult: Secondary | ICD-10-CM

## 2021-12-16 DIAGNOSIS — Z79899 Other long term (current) drug therapy: Secondary | ICD-10-CM | POA: Diagnosis not present

## 2021-12-16 DIAGNOSIS — Z841 Family history of disorders of kidney and ureter: Secondary | ICD-10-CM

## 2021-12-16 DIAGNOSIS — G8929 Other chronic pain: Secondary | ICD-10-CM | POA: Diagnosis present

## 2021-12-16 DIAGNOSIS — I10 Essential (primary) hypertension: Secondary | ICD-10-CM | POA: Diagnosis not present

## 2021-12-16 DIAGNOSIS — R6250 Unspecified lack of expected normal physiological development in childhood: Secondary | ICD-10-CM | POA: Diagnosis present

## 2021-12-16 DIAGNOSIS — G4733 Obstructive sleep apnea (adult) (pediatric): Secondary | ICD-10-CM | POA: Diagnosis present

## 2021-12-16 DIAGNOSIS — R1013 Epigastric pain: Secondary | ICD-10-CM | POA: Diagnosis not present

## 2021-12-16 DIAGNOSIS — R079 Chest pain, unspecified: Secondary | ICD-10-CM | POA: Diagnosis not present

## 2021-12-16 DIAGNOSIS — K3184 Gastroparesis: Secondary | ICD-10-CM | POA: Diagnosis present

## 2021-12-16 DIAGNOSIS — R112 Nausea with vomiting, unspecified: Secondary | ICD-10-CM | POA: Diagnosis not present

## 2021-12-16 DIAGNOSIS — K5730 Diverticulosis of large intestine without perforation or abscess without bleeding: Secondary | ICD-10-CM | POA: Diagnosis not present

## 2021-12-16 DIAGNOSIS — R1033 Periumbilical pain: Secondary | ICD-10-CM | POA: Diagnosis not present

## 2021-12-16 DIAGNOSIS — I160 Hypertensive urgency: Secondary | ICD-10-CM | POA: Diagnosis not present

## 2021-12-16 DIAGNOSIS — Z8249 Family history of ischemic heart disease and other diseases of the circulatory system: Secondary | ICD-10-CM | POA: Diagnosis not present

## 2021-12-16 DIAGNOSIS — K219 Gastro-esophageal reflux disease without esophagitis: Secondary | ICD-10-CM | POA: Diagnosis not present

## 2021-12-16 DIAGNOSIS — F411 Generalized anxiety disorder: Secondary | ICD-10-CM | POA: Diagnosis present

## 2021-12-16 DIAGNOSIS — R0602 Shortness of breath: Secondary | ICD-10-CM | POA: Diagnosis not present

## 2021-12-16 DIAGNOSIS — E876 Hypokalemia: Secondary | ICD-10-CM | POA: Diagnosis not present

## 2021-12-16 DIAGNOSIS — K8689 Other specified diseases of pancreas: Secondary | ICD-10-CM | POA: Diagnosis present

## 2021-12-16 DIAGNOSIS — R109 Unspecified abdominal pain: Secondary | ICD-10-CM | POA: Diagnosis not present

## 2021-12-16 DIAGNOSIS — K8020 Calculus of gallbladder without cholecystitis without obstruction: Secondary | ICD-10-CM | POA: Diagnosis not present

## 2021-12-16 DIAGNOSIS — R1084 Generalized abdominal pain: Secondary | ICD-10-CM | POA: Diagnosis not present

## 2021-12-16 LAB — CBC
HCT: 40.6 % (ref 36.0–46.0)
Hemoglobin: 12 g/dL (ref 12.0–15.0)
MCH: 26.9 pg (ref 26.0–34.0)
MCHC: 29.6 g/dL — ABNORMAL LOW (ref 30.0–36.0)
MCV: 91 fL (ref 80.0–100.0)
Platelets: 187 10*3/uL (ref 150–400)
RBC: 4.46 MIL/uL (ref 3.87–5.11)
RDW: 17.7 % — ABNORMAL HIGH (ref 11.5–15.5)
WBC: 6.7 10*3/uL (ref 4.0–10.5)
nRBC: 0 % (ref 0.0–0.2)

## 2021-12-16 LAB — COMPREHENSIVE METABOLIC PANEL
ALT: 12 U/L (ref 0–44)
AST: 14 U/L — ABNORMAL LOW (ref 15–41)
Albumin: 3.8 g/dL (ref 3.5–5.0)
Alkaline Phosphatase: 85 U/L (ref 38–126)
Anion gap: 6 (ref 5–15)
BUN: 18 mg/dL (ref 8–23)
CO2: 31 mmol/L (ref 22–32)
Calcium: 9.6 mg/dL (ref 8.9–10.3)
Chloride: 103 mmol/L (ref 98–111)
Creatinine, Ser: 0.95 mg/dL (ref 0.44–1.00)
GFR, Estimated: >60 mL/min (ref >60)
Glucose, Bld: 126 mg/dL — ABNORMAL HIGH (ref 70–99)
Potassium: 4.1 mmol/L (ref 3.5–5.1)
Sodium: 140 mmol/L (ref 135–145)
Total Bilirubin: 0.7 mg/dL (ref 0.3–1.2)
Total Protein: 7.8 g/dL (ref 6.5–8.1)

## 2021-12-16 LAB — LIPASE, BLOOD: Lipase: 18 U/L (ref 11–51)

## 2021-12-16 MED ORDER — SACCHAROMYCES BOULARDII 250 MG PO CAPS
250.0000 mg | ORAL_CAPSULE | Freq: Every day | ORAL | Status: DC
Start: 2021-12-17 — End: 2021-12-20
  Administered 2021-12-18 – 2021-12-19 (×2): 250 mg via ORAL
  Filled 2021-12-16 (×4): qty 1

## 2021-12-16 MED ORDER — METOCLOPRAMIDE HCL 10 MG PO TABS
5.0000 mg | ORAL_TABLET | Freq: Three times a day (TID) | ORAL | Status: DC
  Filled 2021-12-16 (×2): qty 0.5

## 2021-12-16 MED ORDER — TRAZODONE HCL 50 MG PO TABS: 25.0000 mg | ORAL_TABLET | Freq: Every evening | ORAL | Status: DC | PRN

## 2021-12-16 MED ORDER — ENOXAPARIN SODIUM 60 MG/0.6ML IJ SOSY
0.5000 mg/kg | PREFILLED_SYRINGE | INTRAMUSCULAR | Status: DC
  Administered 2021-12-18 – 2021-12-23 (×6): 52.5 mg via SUBCUTANEOUS
  Filled 2021-12-16 (×6): qty 0.6

## 2021-12-16 MED ORDER — TRAMADOL HCL 50 MG PO TABS
50.0000 mg | ORAL_TABLET | Freq: Three times a day (TID) | ORAL | Status: DC | PRN
Start: 2021-12-16 — End: 2021-12-17
  Administered 2021-12-17: 50 mg via ORAL
  Filled 2021-12-16: qty 1

## 2021-12-16 MED ORDER — ONDANSETRON HCL 4 MG PO TABS: 4.0000 mg | ORAL_TABLET | Freq: Four times a day (QID) | ORAL | Status: DC | PRN

## 2021-12-16 MED ORDER — LACTATED RINGERS IV BOLUS
1000.0000 mL | Freq: Once | INTRAVENOUS | Status: AC
Start: 2021-12-16 — End: 2021-12-17
  Administered 2021-12-16: 1000 mL via INTRAVENOUS

## 2021-12-16 MED ORDER — ACETAMINOPHEN 325 MG PO TABS
650.0000 mg | ORAL_TABLET | Freq: Four times a day (QID) | ORAL | Status: DC | PRN
  Administered 2021-12-22: 650 mg via ORAL
  Filled 2021-12-16: qty 2

## 2021-12-16 MED ORDER — HYDRALAZINE HCL 25 MG PO TABS
25.0000 mg | ORAL_TABLET | Freq: Three times a day (TID) | ORAL | Status: DC
Start: 2021-12-17 — End: 2021-12-18
  Administered 2021-12-17 (×2): 25 mg via ORAL
  Filled 2021-12-16 (×2): qty 1

## 2021-12-16 MED ORDER — MECLIZINE HCL 25 MG PO TABS
12.5000 mg | ORAL_TABLET | Freq: Three times a day (TID) | ORAL | Status: DC | PRN
  Filled 2021-12-16: qty 0.5

## 2021-12-16 MED ORDER — FAMOTIDINE 20 MG PO TABS
40.0000 mg | ORAL_TABLET | Freq: Once | ORAL | Status: DC
Start: 2021-12-16 — End: 2021-12-16

## 2021-12-16 MED ORDER — ONDANSETRON HCL 4 MG/2ML IJ SOLN
4.0000 mg | Freq: Four times a day (QID) | INTRAMUSCULAR | Status: DC | PRN
  Administered 2021-12-17 – 2021-12-20 (×2): 4 mg via INTRAVENOUS
  Filled 2021-12-16 (×2): qty 2

## 2021-12-16 MED ORDER — SUCRALFATE 1 G PO TABS: 1.0000 g | ORAL_TABLET | Freq: Once | ORAL | Status: DC

## 2021-12-16 MED ORDER — ONDANSETRON HCL 4 MG/2ML IJ SOLN
4.0000 mg | Freq: Once | INTRAMUSCULAR | Status: AC
  Administered 2021-12-16: 4 mg via INTRAVENOUS
  Filled 2021-12-16: qty 2

## 2021-12-16 MED ORDER — MORPHINE SULFATE (PF) 4 MG/ML IV SOLN
4.0000 mg | Freq: Once | INTRAVENOUS | Status: AC
  Administered 2021-12-16: 4 mg via INTRAVENOUS
  Filled 2021-12-16: qty 1

## 2021-12-16 MED ORDER — SODIUM CHLORIDE 0.9 % IV SOLN: INTRAVENOUS | Status: DC

## 2021-12-16 MED ORDER — ACETAMINOPHEN 650 MG RE SUPP: 650.0000 mg | Freq: Four times a day (QID) | RECTAL | Status: DC | PRN

## 2021-12-16 MED ORDER — DICYCLOMINE HCL 20 MG PO TABS: 20.0000 mg | ORAL_TABLET | Freq: Three times a day (TID) | ORAL | Status: DC | PRN

## 2021-12-16 MED ORDER — MAGNESIUM HYDROXIDE 400 MG/5ML PO SUSP
30.0000 mL | Freq: Every day | ORAL | Status: DC | PRN
Start: 2021-12-16 — End: 2021-12-17

## 2021-12-16 MED ORDER — ALBUTEROL SULFATE HFA 108 (90 BASE) MCG/ACT IN AERS: 2.0000 | INHALATION_SPRAY | RESPIRATORY_TRACT | Status: DC | PRN

## 2021-12-16 MED ORDER — MORPHINE SULFATE (PF) 2 MG/ML IV SOLN
2.0000 mg | INTRAVENOUS | Status: DC | PRN
  Administered 2021-12-17: 2 mg via INTRAVENOUS
  Filled 2021-12-16 (×2): qty 1

## 2021-12-16 MED ORDER — METOCLOPRAMIDE HCL 5 MG/ML IJ SOLN: 10.0000 mg | INTRAMUSCULAR | Status: DC

## 2021-12-16 MED ORDER — PANTOPRAZOLE SODIUM 40 MG PO TBEC
40.0000 mg | DELAYED_RELEASE_TABLET | Freq: Every day | ORAL | Status: DC
Start: 2021-12-17 — End: 2021-12-17

## 2021-12-16 MED ORDER — ONDANSETRON 4 MG PO TBDP: 4.0000 mg | ORAL_TABLET | Freq: Three times a day (TID) | ORAL | Status: DC | PRN

## 2021-12-16 NOTE — ED Provider Notes (Signed)
[facility] Provider Note    Event Date/Time   First MD Initiated Contact with Patient 12/16/21 2252     (approximate)   History   Chief Complaint: Abdominal Pain   HPI  Taylor Johnson is a 76 y.o. female with a past history of GERD, diverticulosis, cholelithiasis who comes ED complaining of upper abdominal pain for the past 2 days which is constant, worse with eating, severe.  Nonradiating.  No fever or chills but she does have nausea and vomiting which is worse with any oral intake.  She has not been able to tolerate any food fluids or even taking her medications over the last 24 hours.  Reviewed medical records which revealed extensive recent gastroenterology work-up.  She had EGD in January which was essentially unremarkable.  No ulcers, H. pylori a and pathology negative.  MRCP on Dec 03, 2021 shows a 1 cm gallstone, otherwise unremarkable. Ultrasound right upper quadrant on Dec 10, 2021 shows a 1.6 cm gallstone, no evidence of cholecystitis.  Patient saw general surgery Dr. Lysle Pearl in clinic 2 days ago, recommended for HIDA scan due to nonspecific exam at that time.     Physical Exam   Triage Vital Signs: ED Triage Vitals  Enc Vitals Group     BP 12/16/21 2113 (!) 175/75     Pulse Rate 12/16/21 2113 76     Resp 12/16/21 2113 18     Temp 12/16/21 2113 99 F (37.2 C)     Temp Source 12/16/21 2113 Oral     SpO2 12/16/21 2113 (!) 87 %     Weight --      Height --      Head Circumference --      Peak Flow --      Pain Score 12/16/21 2116 10     Pain Loc --      Pain Edu? --      Excl. in [place]? --     Most recent vital signs: Vitals:   12/16/21 2200 12/16/21 2230  BP: (!) 179/78 (!) 151/57  Pulse: 70 (!) 58  Resp: 15 14  Temp:    SpO2: 100% 100%    General: Awake, no distress.  CV:  Good peripheral perfusion.  Regular rate and rhythm Resp:  Normal effort.  Clear to auscultation bilaterally Abd:  No distention.  Soft with epigastric and  right upper quadrant pain.  Negative Murphy sign. Other:  No lower extremity edema.   ED Results / Procedures / Treatments   Labs (all labs ordered are listed, but only abnormal results are displayed) Labs Reviewed  CBC - Abnormal; Notable for the following components:      Result Value   MCHC 29.6 (*)    RDW 17.7 (*)    All other components within normal limits  COMPREHENSIVE METABOLIC PANEL - Abnormal; Notable for the following components:   Glucose, Bld 126 (*)    AST 14 (*)    All other components within normal limits  LIPASE, BLOOD  COMPREHENSIVE METABOLIC PANEL  CBC     EKG Interpreted by me Sinus rhythm, rate of 65.  Normal axis and intervals.  Normal QRS ST segments.  T wave inversions in the inferolateral leads.  No significant change compared to previous EKG on Dec 10, 2021.   RADIOLOGY Ultrasound right upper quadrant from Dec 10, 2021 independently viewed and interpreted by me, shows cholelithiasis without cholecystitis.  Radiology report reviewed.  HIDA scan pending  PROCEDURES:  Procedures   MEDICATIONS ORDERED IN ED: Medications  traMADol (ULTRAM) tablet 50 mg (has no administration in time range)  hydrALAZINE (APRESOLINE) tablet 25 mg (has no administration in time range)  dicyclomine (BENTYL) tablet 20 mg (has no administration in time range)  meclizine (ANTIVERT) tablet 12.5 mg (has no administration in time range)  metoCLOPramide (REGLAN) tablet 5 mg (has no administration in time range)  pantoprazole (PROTONIX) EC tablet 40 mg (has no administration in time range)  ondansetron (ZOFRAN-ODT) disintegrating tablet 4 mg (has no administration in time range)  Probiotic CAPS 1 capsule (has no administration in time range)  enoxaparin (LOVENOX) injection 40 mg (has no administration in time range)  0.9 %  sodium chloride infusion (has no administration in time range)  acetaminophen (TYLENOL) tablet 650 mg (has no administration in time range)    Or   acetaminophen (TYLENOL) suppository 650 mg (has no administration in time range)  traZODone (DESYREL) tablet 25 mg (has no administration in time range)  magnesium hydroxide (MILK OF MAGNESIA) suspension 30 mL (has no administration in time range)  ondansetron (ZOFRAN) tablet 4 mg (has no administration in time range)    Or  ondansetron (ZOFRAN) injection 4 mg (has no administration in time range)  morphine (PF) 4 MG/ML injection 4 mg (4 mg Intravenous Given 12/16/21 2240)  ondansetron (ZOFRAN) injection 4 mg (4 mg Intravenous Given 12/16/21 2240)  lactated ringers bolus 1,000 mL (1,000 mLs Intravenous New Bag/Given 12/16/21 2240)     IMPRESSION / MDM / ASSESSMENT AND PLAN / ED COURSE  I reviewed the triage vital signs and the nursing notes.                              Differential diagnosis includes, but is not limited to, biliary colic, cholecystitis, dehydration, electrolyte abnormality, renal insufficiency, choledocholithiasis, pancreatitis, gastritis  Patient's presentation is most consistent with acute presentation with potential threat to life or bodily function.  Patient presents with upper abdominal pain and vomiting, recurrent issue which is now worsened.  Patient has p.o. intolerance.  Vital signs are unremarkable.  Exam concerning for upper abdominal pathology, particularly in the biliary system.  Initial vitals show slightly elevated temperature, otherwise normal vital signs.  Labs are unremarkable including normal white blood cell count, LFTs and lipase.  Discussed with general surgery Dr. Lysle Pearl who recommends proceeding with HIDA scan given the patient's nonspecific symptom pattern.  Discussed with hospitalist Dr. Trixie Rude for further management with overnight hydration, symptom control until HIDA can be performed after 7 AM when nuclear medicine tech is available.  I have ordered IV morphine and Zofran and fluid hydration for now.  We will obtain repeat ultrasound down to confirm  no signs of choledocholithiasis.      FINAL CLINICAL IMPRESSION(S) / ED DIAGNOSES   Final diagnoses:  Biliary colic     Rx / DC Orders   ED Discharge Orders     None        Note:  This document was prepared using Dragon voice recognition software and may include unintentional dictation errors.   Carrie Mew, MD 12/16/21 [phone]

## 2021-12-16 NOTE — ED Triage Notes (Signed)
Pt presents via POV c/o N/V and abd pain x2 weeks. Seen in ED for same. Reports hx of chronic abd pain. Reports worsening of symptoms.

## 2021-12-17 ENCOUNTER — Inpatient Hospital Stay: Payer: Medicare Other

## 2021-12-17 DIAGNOSIS — K8689 Other specified diseases of pancreas: Secondary | ICD-10-CM | POA: Diagnosis present

## 2021-12-17 DIAGNOSIS — I160 Hypertensive urgency: Secondary | ICD-10-CM | POA: Diagnosis present

## 2021-12-17 LAB — COMPREHENSIVE METABOLIC PANEL
ALT: 9 U/L (ref 0–44)
AST: 12 U/L — ABNORMAL LOW (ref 15–41)
Albumin: 3.2 g/dL — ABNORMAL LOW (ref 3.5–5.0)
Alkaline Phosphatase: 74 U/L (ref 38–126)
Anion gap: 4 — ABNORMAL LOW (ref 5–15)
BUN: 16 mg/dL (ref 8–23)
CO2: 31 mmol/L (ref 22–32)
Calcium: 8.8 mg/dL — ABNORMAL LOW (ref 8.9–10.3)
Chloride: 105 mmol/L (ref 98–111)
Creatinine, Ser: 0.78 mg/dL (ref 0.44–1.00)
GFR, Estimated: >60 mL/min (ref >60)
Glucose, Bld: 119 mg/dL — ABNORMAL HIGH (ref 70–99)
Potassium: 4.1 mmol/L (ref 3.5–5.1)
Sodium: 140 mmol/L (ref 135–145)
Total Bilirubin: 0.4 mg/dL (ref 0.3–1.2)
Total Protein: 6.7 g/dL (ref 6.5–8.1)

## 2021-12-17 LAB — CBC
HCT: 37.6 % (ref 36.0–46.0)
Hemoglobin: 11.2 g/dL — ABNORMAL LOW (ref 12.0–15.0)
MCH: 27.1 pg (ref 26.0–34.0)
MCHC: 29.8 g/dL — ABNORMAL LOW (ref 30.0–36.0)
MCV: 91 fL (ref 80.0–100.0)
Platelets: 185 10*3/uL (ref 150–400)
RBC: 4.13 MIL/uL (ref 3.87–5.11)
RDW: 17.6 % — ABNORMAL HIGH (ref 11.5–15.5)
WBC: 5.6 10*3/uL (ref 4.0–10.5)
nRBC: 0 % (ref 0.0–0.2)

## 2021-12-17 MED ORDER — MORPHINE SULFATE (PF) 4 MG/ML IV SOLN
3.0000 mg | Freq: Once | INTRAVENOUS | Status: AC
  Administered 2021-12-17: 3 mg via INTRAVENOUS
  Filled 2021-12-17: qty 1

## 2021-12-17 MED ORDER — TECHNETIUM TC 99M MEBROFENIN IV KIT
5.0000 | PACK | Freq: Once | INTRAVENOUS | Status: AC
  Administered 2021-12-17: 4.93 via INTRAVENOUS

## 2021-12-17 MED ORDER — METOCLOPRAMIDE HCL 5 MG/ML IJ SOLN
10.0000 mg | Freq: Four times a day (QID) | INTRAMUSCULAR | Status: DC
Start: 2021-12-18 — End: 2021-12-19
  Administered 2021-12-18 – 2021-12-19 (×6): 10 mg via INTRAVENOUS
  Filled 2021-12-17 (×6): qty 2

## 2021-12-17 MED ORDER — ORAL CARE MOUTH RINSE
15.0000 mL | Freq: Two times a day (BID) | OROMUCOSAL | Status: DC
  Administered 2021-12-17 – 2021-12-22 (×9): 15 mL via OROMUCOSAL

## 2021-12-17 MED ORDER — PROCHLORPERAZINE EDISYLATE 10 MG/2ML IJ SOLN
10.0000 mg | Freq: Four times a day (QID) | INTRAMUSCULAR | Status: DC | PRN
Start: 2021-12-17 — End: 2021-12-20
  Administered 2021-12-17 – 2021-12-20 (×2): 10 mg via INTRAVENOUS
  Filled 2021-12-17 (×3): qty 2

## 2021-12-17 MED ORDER — PANTOPRAZOLE SODIUM 40 MG PO TBEC
40.0000 mg | DELAYED_RELEASE_TABLET | Freq: Every day | ORAL | Status: DC
  Administered 2021-12-19: 40 mg via ORAL
  Filled 2021-12-17: qty 1

## 2021-12-17 MED ORDER — MORPHINE SULFATE (PF) 2 MG/ML IV SOLN
2.0000 mg | INTRAVENOUS | Status: DC | PRN
  Administered 2021-12-18 – 2021-12-20 (×4): 2 mg via INTRAVENOUS
  Filled 2021-12-17 (×4): qty 1

## 2021-12-17 MED ORDER — PANTOPRAZOLE SODIUM 40 MG IV SOLR
40.0000 mg | INTRAVENOUS | Status: AC
  Administered 2021-12-17 – 2021-12-18 (×2): 40 mg via INTRAVENOUS
  Filled 2021-12-17 (×2): qty 10

## 2021-12-17 MED ORDER — KETOROLAC TROMETHAMINE 15 MG/ML IJ SOLN
15.0000 mg | Freq: Four times a day (QID) | INTRAMUSCULAR | Status: AC | PRN
  Administered 2021-12-21 (×2): 15 mg via INTRAVENOUS
  Filled 2021-12-17 (×2): qty 1

## 2021-12-17 MED ORDER — METOCLOPRAMIDE HCL 5 MG/ML IJ SOLN
5.0000 mg | Freq: Four times a day (QID) | INTRAMUSCULAR | Status: DC
  Administered 2021-12-17 (×2): 5 mg via INTRAVENOUS
  Filled 2021-12-17 (×2): qty 2

## 2021-12-17 MED ORDER — CHLORHEXIDINE GLUCONATE 0.12 % MT SOLN
15.0000 mL | Freq: Two times a day (BID) | OROMUCOSAL | Status: DC
  Administered 2021-12-17 – 2021-12-23 (×12): 15 mL via OROMUCOSAL
  Filled 2021-12-17 (×13): qty 15

## 2021-12-17 MED ORDER — DICYCLOMINE HCL 20 MG PO TABS
20.0000 mg | ORAL_TABLET | Freq: Three times a day (TID) | ORAL | Status: DC
  Administered 2021-12-17 – 2021-12-19 (×8): 20 mg via ORAL
  Filled 2021-12-17 (×8): qty 1

## 2021-12-17 NOTE — Progress Notes (Signed)
  Transition of Care (TOC) Screening Note   Patient Details  Name: Cortnie Ringel [place] Date of Birth: [birth year]   [facility] of [facility] CM/SW Contact:    Conception Oms, RN Phone Number: 12/17/2021, 10:03 AM    Transition of Care Department [facility]) has reviewed patient and no TOC needs have been identified at this time. We will continue to monitor patient advancement through interdisciplinary progression rounds. If new patient transition needs arise, please place a TOC consult.

## 2021-12-17 NOTE — Assessment & Plan Note (Addendum)
Blood pressure remains elevated. Continue Norvasc and nitroglycerin ointment. I do not think that hydralazine 3 times daily is a good idea in a patient with healing GI issues and nausea.  Discontinue.

## 2021-12-17 NOTE — Assessment & Plan Note (Addendum)
Presents to the hospital with complaints of 2 weeks of abdominal pain. Initially suspected to be having biliary colic. Although LFTs were normal.  HIDA scan normal. Thus patient does not appear to have any biliary colic. General surgery Dr. Lysle Pearl recommended no intervention. Patient is able to tolerate clear liquid diet.  Continues to report 8 out of 10 abdominal pain. The nausea has resolved.  No vomiting.  Passing gas but no BM so far. X-ray abdomen negative for any acute abnormality. Ultrasound abdomen negative for CBD stone.  At present we will treat pain with scheduled Bentyl, nightly gabapentin, as needed Toradol, as needed morphine and Tylenol. Explained to family that the patient may not have any organic etiology of her pain in the abdomen. Treat constipation with Senokot and MiraLAX. Appreciate GI assistance, recommend continue current medication. Diet advanced to soft diet.

## 2021-12-17 NOTE — Progress Notes (Signed)
[facility] PLAN OF CARE NOTE Patient: Taylor Johnson XNT:[id number]   PCP: [name], MD DOB: 05/22/1946   DOA: 12/16/2021   DOS: 12/17/2021    Patient was admitted by my colleague earlier on 12/17/2021. I have reviewed the H&P as well as assessment and plan and agree with the same. Important changes in the plan are listed below.  Plan of care: Principal Problem:   Biliary colic Active Problems:   Hypertensive urgency   Pancreatic insufficiency Abdominal pain still present.  Reports nausea and vomiting. We will change Reglan from p.o. to scheduled. Change Bentyl to scheduled While initial admission diagnosis biliary colic with normal LFT and normal CBD not sure whether the patient actually has biliary colic or not. HIDA scan is currently pending. Will monitor response to current therapy and recommendation from general surgery.  Patient appears to be avoiding eye contact, unable to answer questions consistently, concern with mood disorder. Sister at bedside denies any acute stressors in life.  Author: Berle Mull, MD [facility] 12/17/2021 2:34 PM   If 7PM-7AM, please contact night-coverage at [email or web address]

## 2021-12-17 NOTE — Progress Notes (Signed)
PHARMACIST - PHYSICIAN COMMUNICATION  CONCERNING:  Enoxaparin (Lovenox) for DVT Prophylaxis    RECOMMENDATION: Patient was prescribed enoxaprin '40mg'$  q24 hours for VTE prophylaxis.   There were no vitals filed for this visit.  There is no height or weight on file to calculate BMI.  Estimated Creatinine Clearance: 60.9 mL/min (by C-G formula based on SCr of 0.95 mg/dL).   Based on [place] patient is candidate for enoxaparin 0.'5mg'$ /kg TBW SQ every 24 hours based on BMI being >30.  DESCRIPTION: Pharmacy has adjusted enoxaparin dose per [facility] policy.  Patient is now receiving enoxaparin 0.5 mg/kg every 24 hours   Renda Rolls, PharmD, [facility] 12/17/2021 12:05 AM

## 2021-12-17 NOTE — H&P (Signed)
[place]   PATIENT NAME: Taylor Johnson    MR#:  [id number]  DATE OF BIRTH:  05-Oct-1945  DATE OF ADMISSION:  12/16/2021  PRIMARY CARE PHYSICIAN: [name], MD   Patient is coming from: Home  REQUESTING/REFERRING PHYSICIAN: Brenton Grills, MD  CHIEF COMPLAINT:   Chief Complaint  Patient presents with   Abdominal Pain    HISTORY OF PRESENT ILLNESS:  Taylor Johnson is a 76 y.o. African-American female with medical history significant for GERD, hypertension, and anxiety, obstructive sleep apnea on home CPAP, who presented to the ER with acute onset of epigastric and to lesser extent right upper quadrant abdominal pain for the last week with associated intermittent nausea and vomiting worsening with food intake.  Patient had an unremarkable EGD in January and has been following with Dr. Lysle Pearl for cholelithiasis on Tuesday when he recommended HIDA scan.  She denies any fever or chills.  No diarrhea or constipation.  No melena protruding per rectum.  She denied any bilious vomitus or hematemesis.  No chest pain dyspnea or cough.  ED Course: 20 she came to the ER BP was 178/76 with otherwise normal vital signs.  Labs revealed unremarkable CMP and CBC. EKG as reviewed by me : EKG showed normal sinus rhythm with a rate of 65 and T wave inversion anterolaterally and inferiorly Imaging: Portable chest ray showed similar cardiomegaly and chronic peribronchial thickening with no acute abnormality.  The patient was given 4 mg of IV morphine sulfate, 4 mg of IV Zofran and 1 L bolus of IV Dr. Oswaldo Milian.  She will be admitted to a medical bed for further evaluation and management. PAST MEDICAL HISTORY:   Past Medical History:  Diagnosis Date   Abdominal pain 01/27/2017   Anxiety    Anxiety, generalized 05/11/2015   Cough 06/02/2016   Chronic - followed by Pulmonary   Cough 06/02/2016   Chronic - followed by Pulmonary   Depression    Developmental delay    Diverticulitis     Diverticulosis of colon 05/11/2015   Dysphagia 10/15/2015   Routine Ba Swallow normal; rec modified barium swallow    GERD (gastroesophageal reflux disease)    Hematochezia 11/07/2019   Hypertension    Hypoxia 09/12/2015   Leg swelling    Localized edema 04/15/2016   Microscopic hematuria 04/15/2016   Nausea and vomiting 01/29/2017   Orthostatic hypotension 03/16/2017   OSA on CPAP 11/18/2015   CPAP @ 18 cm H2O started 02/2016   Rectal bleeding 10/15/2018   Urge incontinence of urine 05/12/2015    PAST SURGICAL HISTORY:   Past Surgical History:  Procedure Laterality Date   ABDOMINAL HYSTERECTOMY     BREAST BIOPSY Left    neg   COLON SURGERY     COLONOSCOPY WITH PROPOFOL N/A 12/13/2019   Procedure: COLONOSCOPY WITH PROPOFOL;  Surgeon: Lin Landsman, MD;  Location: [facility];  Service: Endoscopy;  Laterality: N/A;   ESOPHAGOGASTRODUODENOSCOPY (EGD) WITH PROPOFOL N/A 01/29/2017   Procedure: ESOPHAGOGASTRODUODENOSCOPY (EGD) WITH PROPOFOL;  Surgeon: Wilford Corner, MD;  Location: [facility] ENDOSCOPY;  Service: Endoscopy;  Laterality: N/A;   ESOPHAGOGASTRODUODENOSCOPY (EGD) WITH PROPOFOL N/A 08/02/2021   Procedure: ESOPHAGOGASTRODUODENOSCOPY (EGD) WITH PROPOFOL;  Surgeon: Lin Landsman, MD;  Location: [facility] ENDOSCOPY;  Service: Gastroenterology;  Laterality: N/A;    SOCIAL HISTORY:   Social History   Tobacco Use   Smoking status: Never   Smokeless tobacco: Never   Tobacco comments:    smoking cessation  materials not required  Substance Use Topics   Alcohol use: No    Alcohol/week: 0.0 standard drinks    FAMILY HISTORY:   Family History  Problem Relation Age of Onset   Hypertension Mother    Kidney disease Mother    Dementia Mother    Cancer Father        lung   Bladder Cancer Neg Hx    Breast cancer Neg Hx     DRUG ALLERGIES:  No Known Allergies  REVIEW OF SYSTEMS:   ROS As per history of present illness. All pertinent systems were reviewed  above. Constitutional, HEENT, cardiovascular, respiratory, GI, GU, musculoskeletal, neuro, psychiatric, endocrine, integumentary and hematologic systems were reviewed and are otherwise negative/unremarkable except for positive findings mentioned above in the HPI.   MEDICATIONS AT HOME:   Prior to Admission medications   Medication Sig Start Date End Date Taking? Authorizing Provider  dicyclomine (BENTYL) 20 MG tablet Take 1 tablet (20 mg total) by mouth every 8 (eight) hours as needed for spasms (Abdominal cramping). 02/05/21  Yes Ward, Cyril Mourning N, DO  hydrALAZINE (APRESOLINE) 25 MG tablet TAKE ONE TABLET BY MOUTH THREE TIMES DAILY 07/30/21  Yes [name], MD  lipase/protease/amylase (CREON) 36000 UNITS CPEP capsule Take 2 capsules with the first bite of each meal and 1 capsule with the first bite of each snack 06/07/21  Yes Vanga, Tally Due, MD  meclizine (ANTIVERT) 12.5 MG tablet TAKE ONE TABLET BY MOUTH THREE TIMES DAILY AS NEEDED FOR DIZZINESS 08/31/21  Yes [name], MD  omeprazole (PRILOSEC) 40 MG capsule Take 1 capsule (40 mg total) by mouth 2 (two) times daily before a meal for 30 days, THEN 1 capsule (40 mg total) daily. 08/05/21 12/16/21 Yes Vanga, Tally Due, MD  ondansetron (ZOFRAN-ODT) 4 MG disintegrating tablet Take 1 tablet (4 mg total) by mouth every 8 (eight) hours as needed for nausea or vomiting. 12/10/21  Yes Paulette Blanch, MD  Saccharomyces boulardii (PROBIOTIC) 250 MG CAPS Take 1 capsule by mouth in the morning and at bedtime. 11/20/21  Yes Merlyn Lot, MD  traMADol (ULTRAM) 50 MG tablet Take 50 mg by mouth 3 (three) times daily as needed. 12/14/21  Yes [provider]  metoCLOPramide (REGLAN) 5 MG tablet TAKE ONE TABLET BY MOUTH EVERY MORNING, NOON, EVENING Patient not taking: Reported on 12/16/2021 10/13/21   [name], MD      VITAL SIGNS:  Blood pressure (!) 172/71, pulse 78, temperature 97.6 F (36.4 C), resp. rate 17, SpO2 95  %.  PHYSICAL EXAMINATION:  Physical Exam  GENERAL:  76 y.o.-year-old African-American female patient lying in the bed with no acute distress.  EYES: Pupils equal, round, reactive to light and accommodation. No scleral icterus. Extraocular muscles intact.  HEENT: Head atraumatic, normocephalic. Oropharynx and nasopharynx clear.  NECK:  Supple, no jugular venous distention. No thyroid enlargement, no tenderness.  LUNGS: Normal breath sounds bilaterally, no wheezing, rales,rhonchi or crepitation. No use of accessory muscles of respiration.  CARDIOVASCULAR: Regular rate and rhythm, S1, S2 normal. No murmurs, rubs, or gallops.  ABDOMEN: Soft, nondistended, with epigastric abdominal tenderness without rebound tenderness guarding or rigidity.. Bowel sounds present. No organomegaly or mass.  EXTREMITIES: No pedal edema, cyanosis, or clubbing.  NEUROLOGIC: Cranial nerves II through XII are intact. Muscle strength 5/5 in all extremities. Sensation intact. Gait not checked.  PSYCHIATRIC: The patient is alert and oriented x 3.  Normal affect and good eye contact. SKIN: No obvious rash,  lesion, or ulcer.   LABORATORY PANEL:   CBC Recent Labs  Lab 12/16/21 2126  WBC 6.7  HGB 12.0  HCT 40.6  PLT 187   ------------------------------------------------------------------------------------------------------------------  Chemistries  Recent Labs  Lab 12/16/21 2126  NA 140  K 4.1  CL 103  CO2 31  GLUCOSE 126*  BUN 18  CREATININE 0.95  CALCIUM 9.6  AST 14*  ALT 12  ALKPHOS 85  BILITOT 0.7   ------------------------------------------------------------------------------------------------------------------  Cardiac Enzymes No results for input(s): TROPONINI in the last 168 hours. ------------------------------------------------------------------------------------------------------------------  RADIOLOGY:  DG Chest 1 View  Result Date: 12/16/2021 CLINICAL DATA:  Nausea and vomiting with  abdominal pain. EXAM: CHEST  1 VIEW COMPARISON:  Radiograph 11/20/2021 FINDINGS: Costophrenic angles are excluded from the field of view. Similar cardiomegaly. Chronic peribronchial thickening. No acute airspace disease. No pleural effusion or pneumothorax. Chronic shoulder degenerative change. IMPRESSION: Similar cardiomegaly and chronic peribronchial thickening. No acute abnormality. Electronically Signed   By: Keith Rake M.D.   On: 12/16/2021 22:07   US ABDOMEN LIMITED RUQ (LIVER/GB)  Result Date: 12/16/2021 CLINICAL DATA:  EPIGASTRIC PAIN. EXAM: ULTRASOUND ABDOMEN LIMITED RIGHT UPPER QUADRANT COMPARISON:  MRI 12/03/2021, ULTRASOUND 12/09/2021. FINDINGS: Gallbladder: single 1.6 cm stone again noted in the proximal gallbladder. No sonographic Murphy sign noted by sonographer. There is no wall thickening or pericholecystic fluid. Common bile duct: Diameter: 4.6 mm.  No intrahepatic biliary dilatation is seen. Liver: No focal lesion identified. There is mild increased hepatic echogenicity consistent with steatosis. Portal vein is patent on color Doppler imaging with normal direction of blood flow towards the liver. Other: There is mild increased echogenicity in the right renal cortex consistent with medical renal disease. IMPRESSION: 1. Cholelithiasis without sonographic findings of acute cholecystitis. 2. Increased liver echogenicity of steatosis. 3. No biliary dilatation. 4. Mild increased right renal cortical echogenicity consistent with medical renal disease. Electronically Signed   By: Telford Nab M.D.   On: 12/16/2021 23:51      IMPRESSION AND PLAN:  Assessment and Plan: * Biliary colic -The patient will be admitted to a medical bed. - A right upper quadrant ultrasound was obtained and revealed cholelithiasis without cholecystitis and increased liver echogenicity of steatosis with no biliary dilatation and medical renal disease. - The patient be hydrated with IV normal saline. - We will  keep her n.p.o. after midnight. - Pain management will be provided. - We will obtain a HIDA scan. - General surgery consult will be obtained. -Dr. Lysle Pearl was notified about the patient.  Hypertensive urgency - This is likely secondary to pain. - We will continue his antihypertensives. - She will be placed on as needed IV labetalol. - Adequate pain management to be pursued.  Pancreatic insufficiency - We will continue her Creon. - Her serum lipase level came back normal.       DVT prophylaxis: Lovenox.  Advanced Care Planning:  Code Status: full code.  Family Communication:  The plan of care was discussed in details with the patient (and family). I answered all questions. The patient agreed to proceed with the above mentioned plan. Further management will depend upon hospital course. Disposition Plan: Back to previous home environment Consults called: General surgery. All the records are reviewed and case discussed with ED provider.  Status is: Inpatient  At the time of the admission, it appears that the appropriate admission status for this patient is inpatient.  This is judged to be reasonable and necessary in order to provide the required intensity of  service to ensure the patient's safety given the presenting symptoms, physical exam findings and initial radiographic and laboratory data in the context of comorbid conditions.  The patient requires inpatient status due to high intensity of service, high risk of further deterioration and high frequency of surveillance required.  I certify that at the time of admission, it is my clinical judgment that the patient will require inpatient hospital care extending more than 2 midnights.                            Dispo: The patient is from: Home              Anticipated d/c is to: Home              Patient currently is not medically stable to d/c.              Difficult to place patient: No  Christel Mormon M.D on 12/17/2021 at 4:35  AM  [facility]   From 7 PM-7 AM, contact night-coverage [email or web address]  CC: Primary care physician; [name], MD

## 2021-12-17 NOTE — Assessment & Plan Note (Signed)
We will continue her Creon.

## 2021-12-17 NOTE — Plan of Care (Signed)

## 2021-12-18 ENCOUNTER — Inpatient Hospital Stay: Payer: Medicare Other

## 2021-12-18 DIAGNOSIS — R1084 Generalized abdominal pain: Secondary | ICD-10-CM | POA: Diagnosis not present

## 2021-12-18 DIAGNOSIS — G8929 Other chronic pain: Secondary | ICD-10-CM | POA: Diagnosis not present

## 2021-12-18 LAB — BASIC METABOLIC PANEL
Anion gap: 6 (ref 5–15)
BUN: 20 mg/dL (ref 8–23)
CO2: 31 mmol/L (ref 22–32)
Calcium: 9 mg/dL (ref 8.9–10.3)
Chloride: 105 mmol/L (ref 98–111)
Creatinine, Ser: 0.87 mg/dL (ref 0.44–1.00)
GFR, Estimated: >60 mL/min (ref >60)
Glucose, Bld: 107 mg/dL — ABNORMAL HIGH (ref 70–99)
Potassium: 3.8 mmol/L (ref 3.5–5.1)
Sodium: 142 mmol/L (ref 135–145)

## 2021-12-18 LAB — MAGNESIUM: Magnesium: 2 mg/dL (ref 1.7–2.4)

## 2021-12-18 MED ORDER — SENNOSIDES-DOCUSATE SODIUM 8.6-50 MG PO TABS
1.0000 | ORAL_TABLET | Freq: Two times a day (BID) | ORAL | Status: DC
  Administered 2021-12-18 – 2021-12-23 (×9): 1 via ORAL
  Filled 2021-12-18 (×9): qty 1

## 2021-12-18 MED ORDER — GABAPENTIN 100 MG PO CAPS
100.0000 mg | ORAL_CAPSULE | Freq: Three times a day (TID) | ORAL | Status: DC
  Administered 2021-12-18 – 2021-12-19 (×4): 100 mg via ORAL
  Filled 2021-12-18 (×4): qty 1

## 2021-12-18 MED ORDER — GUAIFENESIN-DM 100-10 MG/5ML PO SYRP
5.0000 mL | ORAL_SOLUTION | ORAL | Status: DC | PRN
Start: 2021-12-18 — End: 2021-12-23
  Administered 2021-12-18 – 2021-12-19 (×2): 5 mL via ORAL
  Filled 2021-12-18 (×2): qty 5

## 2021-12-18 MED ORDER — POLYETHYLENE GLYCOL 3350 17 G PO PACK
17.0000 g | PACK | Freq: Every day | ORAL | Status: DC
  Administered 2021-12-18 – 2021-12-19 (×2): 17 g via ORAL
  Filled 2021-12-18 (×2): qty 1

## 2021-12-18 MED ORDER — AMLODIPINE BESYLATE 5 MG PO TABS
5.0000 mg | ORAL_TABLET | Freq: Every day | ORAL | Status: DC
  Administered 2021-12-18: 5 mg via ORAL
  Filled 2021-12-18: qty 1

## 2021-12-18 NOTE — Hospital Course (Addendum)
76 year old female with past medical history of GERD, HTN, chronic abdominal pain, chronic Reglan use, OSA on CPAP presents to the hospital with complaints of recurrent abdominal pain in epigastric region for last 2 weeks. HIDA scan was performed which is negative.  General surgery was consulted also recommended no intervention. GI consulted.  Started on gabapentin.  Due to drowsiness currently gabapentin is discontinued. GI has switch from IV Reglan to IV erythromycin and GI recommended to start the patient on IV antibiotics for possible intra-abdominal infection.  No imaging recommended. Psychiatric consult on 6/6 to assist with possible somatization.

## 2021-12-18 NOTE — Progress Notes (Signed)
  Progress Note Patient: Taylor Johnson JFH:[id number] DOB: [birth year] DOA: 12/16/2021  DOS: the patient was seen and examined on 12/18/2021  Brief hospital course: 76 year old female with past medical history of GERD, HTN, chronic abdominal pain, chronic Reglan use, OSA on CPAP presents to the hospital with complaints of recurrent abdominal pain in epigastric region for last 2 weeks. HIDA scan was performed which is negative.  General surgery was consulted also recommended no intervention. GI consulted.  Started on gabapentin. Assessment and Plan: * Abdominal pain, chronic, generalized Presents to the hospital with complaints of 2 weeks of abdominal pain. Initially suspected to be having biliary colic. Although LFTs were normal.  HIDA scan normal. Thus patient does not appear to have any biliary colic. General surgery Dr. Lysle Pearl recommended no intervention. Patient is able to tolerate clear liquid diet.  Continues to report 8 out of 10 abdominal pain. The nausea has resolved.  No vomiting.  Passing gas but no BM so far. Discussed with GI who will see the patient on 6/4. X-ray abdomen negative for any acute abnormality. Ultrasound abdomen negative for CBD stone.  At present we will treat pain with scheduled Bentyl, gabapentin, as needed Toradol, as needed morphine and Tylenol. Explained to family that the patient may not have any organic etiology of her pain in the abdomen. Treat constipation with Senokot and MiraLAX.  Hypertensive urgency Blood pressure elevated possibly pain possibly anxiety. Currently on hydralazine 3 times daily although suspect in patient who is having recurrent GI issues medicine that needs to be administered multiple times a day would not control blood pressure adequately. Therefore we will switch her to Norvasc 5 mg daily.  Pancreatic insufficiency We will continue her Creon.  Morbid obesity ([place]) Monitor for now. Placing the pt at higher risk of poor outcomes.    GERD (gastroesophageal reflux disease) Continue PPI.  Patient was treated with IV PPI.  OSA on CPAP Continue CPAP nightly.  Subjective: No nausea no vomiting.  No fever no chills.  Tolerating clear liquid diet.  Passing gas.  No BM.  Still reports 8 out of 10 diffuse abdominal pain.  Physical Exam: Vitals:   12/17/21 2126 12/17/21 2311 12/18/21 0400 12/18/21 0954  BP:  (!) 148/55 (!) 155/55 (!) 177/74  Pulse: (!) 58 (!) 57 65 72  Resp:    18  Temp:   98.8 F (37.1 C) 98 F (36.7 C)  TempSrc:   Oral   SpO2: 100% 100% 100% 100%   General: Appear in mild distress; no visible Abnormal Neck Mass Or lumps, Conjunctiva normal Cardiovascular: S1 and S2 Present, no Murmur, Respiratory: good respiratory effort, Bilateral Air entry present and CTA, no Crackles, no wheezes Abdomen: Bowel Sound present, diffusely tender Extremities: no Pedal edema Neurology: alert and oriented to time, place, and person  Gait not checked due to patient safety concerns   Data Reviewed: I have Reviewed nursing notes, Vitals, and Lab results since pt's last encounter. Pertinent lab results CBC and BMP and x-ray abdomen I have ordered test including CBC and BMP I have discussed pt's care plan and test results with GI.   Family Communication: Multiple family members at bedside  Disposition: Status is: Inpatient Remains inpatient appropriate because: With poor p.o. intake and ongoing abdominal pain.  Monitor for improvement in oral intake.  Author: Berle Mull, MD 12/18/2021 5:47 PM  Please look on [email or web address] to find out who is on call.

## 2021-12-18 NOTE — Assessment & Plan Note (Addendum)
Body mass index is 39.77 kg/m.  Placing the pt at higher risk of poor outcomes.

## 2021-12-18 NOTE — Plan of Care (Signed)

## 2021-12-18 NOTE — Assessment & Plan Note (Signed)
Continue PPI.  Patient was treated with IV PPI.

## 2021-12-18 NOTE — Assessment & Plan Note (Addendum)
Unable to continue CPAP as patient's has ongoing complaint of frequent nausea and tendency to spit/dry heaves. Continue oxygen.

## 2021-12-19 ENCOUNTER — Inpatient Hospital Stay: Payer: Medicare Other

## 2021-12-19 DIAGNOSIS — R1084 Generalized abdominal pain: Secondary | ICD-10-CM | POA: Diagnosis not present

## 2021-12-19 DIAGNOSIS — G8929 Other chronic pain: Secondary | ICD-10-CM | POA: Diagnosis not present

## 2021-12-19 LAB — CBC
HCT: 39.3 % (ref 36.0–46.0)
Hemoglobin: 11.7 g/dL — ABNORMAL LOW (ref 12.0–15.0)
MCH: 27.2 pg (ref 26.0–34.0)
MCHC: 29.8 g/dL — ABNORMAL LOW (ref 30.0–36.0)
MCV: 91.4 fL (ref 80.0–100.0)
Platelets: 199 10*3/uL (ref 150–400)
RBC: 4.3 MIL/uL (ref 3.87–5.11)
RDW: 17.9 % — ABNORMAL HIGH (ref 11.5–15.5)
WBC: 5.7 10*3/uL (ref 4.0–10.5)
nRBC: 0 % (ref 0.0–0.2)

## 2021-12-19 LAB — BASIC METABOLIC PANEL
Anion gap: 7 (ref 5–15)
BUN: 20 mg/dL (ref 8–23)
CO2: 27 mmol/L (ref 22–32)
Calcium: 9 mg/dL (ref 8.9–10.3)
Chloride: 107 mmol/L (ref 98–111)
Creatinine, Ser: 1.12 mg/dL — ABNORMAL HIGH (ref 0.44–1.00)
GFR, Estimated: 51 mL/min — ABNORMAL LOW (ref >60)
Glucose, Bld: 150 mg/dL — ABNORMAL HIGH (ref 70–99)
Potassium: 4.1 mmol/L (ref 3.5–5.1)
Sodium: 141 mmol/L (ref 135–145)

## 2021-12-19 LAB — MAGNESIUM: Magnesium: 2.1 mg/dL (ref 1.7–2.4)

## 2021-12-19 MED ORDER — ENSURE ENLIVE PO LIQD
237.0000 mL | Freq: Two times a day (BID) | ORAL | Status: DC
  Administered 2021-12-19: 237 mL via ORAL

## 2021-12-19 MED ORDER — AMLODIPINE BESYLATE 5 MG PO TABS
2.5000 mg | ORAL_TABLET | Freq: Every day | ORAL | Status: DC
  Administered 2021-12-19: 2.5 mg via ORAL
  Filled 2021-12-19: qty 1

## 2021-12-19 MED ORDER — METOCLOPRAMIDE HCL 5 MG/ML IJ SOLN
10.0000 mg | Freq: Once | INTRAMUSCULAR | Status: AC
  Administered 2021-12-19: 10 mg via INTRAVENOUS
  Filled 2021-12-19: qty 2

## 2021-12-19 MED ORDER — BOOST / RESOURCE BREEZE PO LIQD CUSTOM
1.0000 | Freq: Three times a day (TID) | ORAL | Status: DC
  Administered 2021-12-19 – 2021-12-23 (×6): 1 via ORAL

## 2021-12-19 MED ORDER — GABAPENTIN 100 MG PO CAPS
100.0000 mg | ORAL_CAPSULE | Freq: Every day | ORAL | Status: DC
  Administered 2021-12-19: 100 mg via ORAL
  Filled 2021-12-19: qty 1

## 2021-12-19 MED ORDER — HYDRALAZINE HCL 20 MG/ML IJ SOLN
10.0000 mg | Freq: Once | INTRAMUSCULAR | Status: AC
  Administered 2021-12-19: 10 mg via INTRAVENOUS
  Filled 2021-12-19: qty 1

## 2021-12-19 MED ORDER — METOCLOPRAMIDE HCL 10 MG PO TABS
10.0000 mg | ORAL_TABLET | Freq: Three times a day (TID) | ORAL | Status: DC
Start: 2021-12-19 — End: 2021-12-20
  Administered 2021-12-19 (×2): 10 mg via ORAL
  Filled 2021-12-19 (×6): qty 1

## 2021-12-19 NOTE — Consult Note (Signed)
[name], MD [address]  [place]  [place], [place] [address]  Main: [phone]  Fax: [phone] Pager: [phone]   Consultation  Referring Provider:     No ref. provider found Primary Care Physician:  [name], MD Primary Gastroenterologist:  Dr. [name]       Reason for Consultation:   Acute on chronic abdominal pain, nausea, vomiting  Date of Admission:  12/16/2021 Date of Consultation:  12/19/2021         HPI:   Taylor Johnson is a 76 y.o. female morbid obesity, pancolonic diverticulosis, acute on chronic abdominal pain, cholelithiasis, GERD, hypertension, anxiety, obstructive sleep apnea is admitted with recurrence of abdominal pain in the epigastric region for last 2 weeks.  Patient underwent HIDA scan during this admission which was unremarkable.  General surgery was consulted and recommended no intervention at this time.  Patient has been medically managed with Bentyl, IV Reglan, PPI, Creon for history of pancreatic insufficiency and low residue diet.  Gabapentin has been started because the abdominal pain was thought to be functional.  Patient underwent CT abdomen pelvis with contrast/6/23, which revealed cholelithiasis only.  Subsequently, underwent MRCP which was unremarkable except for cholelithiasis. Patient is well-known to me from my clinic.  She has history of chronic abdominal pain, most recently found to have cholelithiasis.  Therefore, she has been referred to general surgery as outpatient.  She has an appointment to see Dr. Hampton Abbot on 6/14.  NSAIDs: None  Antiplts/Anticoagulants/Anti thrombotics: None  GI Procedures:  EGD 01/2017 normal. Colonoscopy 04/2014 showed diverticulosis. Complete report not available and patient reports that it was done at [facility]   Colonoscopy 12/13/2019 - Hemorrhoids found on perianal exam. - One 10 mm polyp in the ascending colon, removed with a hot snare. Resected and retrieved. - Three  3 to 6 mm polyps in the descending colon and in the ascending colon, removed with a cold snare. Resected and retrieved. - Diverticulosis in the entire examined colon. - Non-bleeding internal hemorrhoids.   DIAGNOSIS:  A. COLON POLYP X3, ASCENDING; BIOPSY:  - 2 FRAGMENTS OF TUBULAR ADENOMA AND A FRAGMENT OF BENIGN COLONIC  MUCOSA.  - NEGATIVE FOR HIGH-GRADE DYSPLASIA AND MALIGNANCY.   B. COLON POLYP, DESCENDING; BIOPSY:  - FRAGMENTS OF TUBULAR ADENOMA.  - NEGATIVE FOR HIGH-GRADE DYSPLASIA AND MALIGNANCY.     Past Medical History:  Diagnosis Date   Abdominal pain 01/27/2017   Anxiety    Anxiety, generalized 05/11/2015   Cough 06/02/2016   Chronic - followed by Pulmonary   Cough 06/02/2016   Chronic - followed by Pulmonary   Depression    Developmental delay    Diverticulitis    Diverticulosis of colon 05/11/2015   Dysphagia 10/15/2015   Routine Ba Swallow normal; rec modified barium swallow    GERD (gastroesophageal reflux disease)    Hematochezia 11/07/2019   Hypertension    Hypoxia 09/12/2015   Leg swelling    Localized edema 04/15/2016   Microscopic hematuria 04/15/2016   Nausea and vomiting 01/29/2017   Orthostatic hypotension 03/16/2017   OSA on CPAP 11/18/2015   CPAP @ 18 cm H2O started 02/2016   Rectal bleeding 10/15/2018   Urge incontinence of urine 05/12/2015    Past Surgical History:  Procedure Laterality Date   ABDOMINAL HYSTERECTOMY     BREAST BIOPSY Left    neg   COLON SURGERY     COLONOSCOPY WITH PROPOFOL N/A 12/13/2019   Procedure:  COLONOSCOPY WITH PROPOFOL;  Surgeon: Lin Landsman, MD;  Location: [facility] ENDOSCOPY;  Service: Endoscopy;  Laterality: N/A;   ESOPHAGOGASTRODUODENOSCOPY (EGD) WITH PROPOFOL N/A 01/29/2017   Procedure: ESOPHAGOGASTRODUODENOSCOPY (EGD) WITH PROPOFOL;  Surgeon: Wilford Corner, MD;  Location: [facility] ENDOSCOPY;  Service: Endoscopy;  Laterality: N/A;   ESOPHAGOGASTRODUODENOSCOPY (EGD) WITH PROPOFOL N/A 08/02/2021    Procedure: ESOPHAGOGASTRODUODENOSCOPY (EGD) WITH PROPOFOL;  Surgeon: Lin Landsman, MD;  Location: [facility] ENDOSCOPY;  Service: Gastroenterology;  Laterality: N/A;    Current Facility-Administered Medications:    0.9 %  sodium chloride infusion, , Intravenous, Continuous, Mansy, Jan A, MD, Last Rate: 100 mL/hr at 12/19/21 2258, New Bag at 12/19/21 2258   acetaminophen (TYLENOL) tablet 650 mg, 650 mg, Oral, Q6H PRN **OR** acetaminophen (TYLENOL) suppository 650 mg, 650 mg, Rectal, Q6H PRN, Mansy, Jan A, MD   amLODipine (NORVASC) tablet 2.5 mg, 2.5 mg, Oral, Daily, Berle Mull M, MD, 2.5 mg at 12/19/21 1217   chlorhexidine (PERIDEX) 0.12 % solution 15 mL, 15 mL, Mouth Rinse, BID, Lavina Hamman, MD, 15 mL at 12/19/21 2258   dicyclomine (BENTYL) tablet 20 mg, 20 mg, Oral, TID, Lavina Hamman, MD, 20 mg at 12/19/21 2124   enoxaparin (LOVENOX) injection 52.5 mg, 0.5 mg/kg, Subcutaneous, Q24H, Mansy, Jan A, MD, 52.5 mg at 12/19/21 0825   feeding supplement (BOOST / RESOURCE BREEZE) liquid 1 Container, 1 Container, Oral, TID BM, Lavina Hamman, MD, 1 Container at 12/19/21 1602   feeding supplement (ENSURE ENLIVE / ENSURE PLUS) liquid 237 mL, 237 mL, Oral, BID BM, Lavina Hamman, MD, 237 mL at 12/19/21 1601   gabapentin (NEURONTIN) capsule 100 mg, 100 mg, Oral, QHS, Lavina Hamman, MD, 100 mg at 12/19/21 2124   guaiFENesin-dextromethorphan (ROBITUSSIN DM) 100-10 MG/5ML syrup 5 mL, 5 mL, Oral, Q4H PRN, Lavina Hamman, MD, 5 mL at 12/19/21 2124   hydrALAZINE (APRESOLINE) injection 10 mg, 10 mg, Intravenous, Once, Zierle-Ghosh, Asia B, DO   ketorolac (TORADOL) 15 MG/ML injection 15 mg, 15 mg, Intravenous, Q6H PRN, Lavina Hamman, MD   meclizine (ANTIVERT) tablet 12.5 mg, 12.5 mg, Oral, TID PRN, Mansy, Jan A, MD   MEDLINE mouth rinse, 15 mL, Mouth Rinse, q12n4p, Lavina Hamman, MD, 15 mL at 12/19/21 1603   metoCLOPramide (REGLAN) tablet 10 mg, 10 mg, Oral, TID AC & HS, Lavina Hamman, MD, 10 mg  at 12/19/21 2124   morphine (PF) 2 MG/ML injection 2 mg, 2 mg, Intravenous, Q3H PRN, Lavina Hamman, MD, 2 mg at 12/19/21 2125   ondansetron (ZOFRAN) tablet 4 mg, 4 mg, Oral, Q6H PRN **OR** ondansetron (ZOFRAN) injection 4 mg, 4 mg, Intravenous, Q6H PRN, Mansy, Jan A, MD, 4 mg at 12/17/21 1802   pantoprazole (PROTONIX) EC tablet 40 mg, 40 mg, Oral, Daily, Lavina Hamman, MD, 40 mg at 12/19/21 0851   polyethylene glycol (MIRALAX / GLYCOLAX) packet 17 g, 17 g, Oral, Daily, Lavina Hamman, MD, 17 g at 12/19/21 1610   prochlorperazine (COMPAZINE) injection 10 mg, 10 mg, Intravenous, Q6H PRN, Lavina Hamman, MD, 10 mg at 12/17/21 2023   saccharomyces boulardii (FLORASTOR) capsule 250 mg, 250 mg, Oral, Daily, Mansy, Jan A, MD, 250 mg at 12/19/21 9604   senna-docusate (Senokot-S) tablet 1 tablet, 1 tablet, Oral, BID, Lavina Hamman, MD, 1 tablet at 12/19/21 2125   traZODone (DESYREL) tablet 25 mg, 25 mg, Oral, QHS PRN, Mansy, Arvella Merles, MD    Family History  Problem Relation Age of Onset  Hypertension Mother    Kidney disease Mother    Dementia Mother    Cancer Father        lung   Bladder Cancer Neg Hx    Breast cancer Neg Hx      Social History   Tobacco Use   Smoking status: Never   Smokeless tobacco: Never   Tobacco comments:    smoking cessation materials not required  Vaping Use   Vaping Use: Never used  Substance Use Topics   Alcohol use: No    Alcohol/week: 0.0 standard drinks   Drug use: No    Allergies as of 12/16/2021   (No Known Allergies)    Review of Systems:    All systems reviewed and negative except where noted in HPI.   Physical Exam:  Vital signs in last 24 hours: Temp:  [97.9 F (36.6 C)-99.3 F (37.4 C)] 99.3 F (37.4 C) (06/04 2011) Pulse Rate:  [77-101] 79 (06/04 2146) Resp:  [16-20] 17 (06/04 2146) BP: (86-186)/(48-108) 172/86 (06/04 2146) SpO2:  [92 %-100 %] 100 % (06/04 2146) Last BM Date : 12/16/21 General:   Pleasant, cooperative in  NAD Head:  Normocephalic and atraumatic. Eyes:   No icterus.   Conjunctiva pink. PERRLA. Ears:  Normal auditory acuity. Neck:  Supple; no masses or thyroidomegaly Lungs: Respirations even and unlabored. Lungs clear to auscultation bilaterally.   No wheezes, crackles, or rhonchi.  Heart:  Regular rate and rhythm;  Without murmur, clicks, rubs or gallops Abdomen:  Soft, nondistended, nontender. Normal bowel sounds. No appreciable masses or hepatomegaly.  No rebound or guarding.  Rectal:  Not performed. Msk:  Symmetrical without gross deformities.  Strength  Extremities:  Without edema, cyanosis or clubbing. Neurologic:  Alert and oriented x3;  grossly normal neurologically. Skin:  Intact without significant lesions or rashes. Psych:  Alert and cooperative. Normal affect.  LAB RESULTS:    Latest Ref Rng & Units 12/19/2021    4:35 AM 12/17/2021    3:56 AM 12/16/2021    9:26 PM  CBC  WBC 4.0 - 10.5 K/uL 5.7   5.6   6.7    Hemoglobin 12.0 - 15.0 g/dL 11.7   11.2   12.0    Hematocrit 36.0 - 46.0 % 39.3   37.6   40.6    Platelets 150 - 400 K/uL 199   185   187      BMET    Latest Ref Rng & Units 12/19/2021    4:35 AM 12/18/2021    9:04 AM 12/17/2021    3:56 AM  BMP  Glucose 70 - 99 mg/dL 150   107   119    BUN 8 - 23 mg/dL 20   20   16     Creatinine 0.44 - 1.00 mg/dL 1.12   0.87   0.78    Sodium 135 - 145 mmol/L 141   142   140    Potassium 3.5 - 5.1 mmol/L 4.1   3.8   4.1    Chloride 98 - 111 mmol/L 107   105   105    CO2 22 - 32 mmol/L 27   31   31     Calcium 8.9 - 10.3 mg/dL 9.0   9.0   8.8      LFT    Latest Ref Rng & Units 12/17/2021    3:56 AM 12/16/2021    9:26 PM 12/09/2021    9:44 PM  Hepatic Function  Total Protein  6.5 - 8.1 g/dL 6.7   7.8   7.3    Albumin 3.5 - 5.0 g/dL 3.2   3.8   3.6    AST 15 - 41 U/L 12   14   15     ALT 0 - 44 U/L 9   12   9     Alk Phosphatase 38 - 126 U/L 74   85   71    Total Bilirubin 0.3 - 1.2 mg/dL 0.4   0.7   0.5       STUDIES: DG Chest Port  1 View  Result Date: 12/19/2021 CLINICAL DATA:  Shortness of breath EXAM: PORTABLE CHEST 1 VIEW COMPARISON:  December 16, 2021 FINDINGS: Stable cardiomegaly. The hila and mediastinum are unchanged. No pneumothorax. No nodules or masses. Mild interstitial prominence. No suspicious infiltrates. IMPRESSION: Findings are most consistent with cardiomegaly and mild edema. Electronically Signed   By: Dorise Bullion III M.D.   On: 12/19/2021 15:04   DG Abd Portable 1V  Result Date: 12/18/2021 CLINICAL DATA:  Abdominal pain and discomfort. History of diverticulitis. EXAM: PORTABLE ABDOMEN - 1 VIEW COMPARISON:  January 12, 2017 FINDINGS: A 5 cm calcification in the left side of the pelvis is likely a calcified fibroid. There may be a smaller calcified fibroid to the right of midline measuring 10 mm. No free air, portal venous gas, or pneumatosis identified on supine imaging. The bowel gas pattern is nonobstructive. No other acute abnormalities. IMPRESSION: 1. Evaluation for free air is limited on supine imaging but none is identified. 2. Nonobstructive bowel gas pattern. 3. No other acute abnormalities. Electronically Signed   By: Dorise Bullion III M.D.   On: 12/18/2021 09:18      Impression / Plan:   Asuka Dusseau Korinek is a 76 y.o. female with morbid obesity, history of pancolonic diverticulosis, hypertension, CKD, OSA, history of anxiety, history of exocrine pancreatic insufficiency based on pancreatic fecal elastase levels, history of chronic abdominal pain of varying location is admitted with approximately 2 weeks history of epigastric pain associated with nausea and vomiting.  Patient is found to have 1.6 cm stone in the proximal gallbladder.  LFTs have been normal, serum lipase normal.  HIDA scan normal.  Differentials include functional dyspepsia or gastroparesis or viral gastritis or symptomatic cholelithiasis or functional abdominal pain   Thank you for involving me in the care of this patient.      LOS: 3  days   [name], MD  12/19/2021, 11:25 PM    Note: This dictation was prepared with Dragon dictation along with smaller phrase technology. Any transcriptional errors that result from this process are unintentional.

## 2021-12-19 NOTE — Progress Notes (Signed)
Progress Note Patient: Taylor Johnson VOH:[id number] DOB: 10-25-1945 DOA: 12/16/2021  DOS: the patient was seen and examined on 12/19/2021  Brief hospital course: 76 year old female with past medical history of GERD, HTN, chronic abdominal pain, chronic Reglan use, OSA on CPAP presents to the hospital with complaints of recurrent abdominal pain in epigastric region for last 2 weeks. HIDA scan was performed which is negative.  General surgery was consulted also recommended no intervention. GI consulted.  Started on gabapentin. Assessment and Plan: * Abdominal pain, chronic, generalized Presents to the hospital with complaints of 2 weeks of abdominal pain. Initially suspected to be having biliary colic. Although LFTs were normal.  HIDA scan normal. Thus patient does not appear to have any biliary colic. General surgery Dr. Lysle Pearl recommended no intervention. Patient is able to tolerate clear liquid diet.  Continues to report 8 out of 10 abdominal pain. The nausea has resolved.  No vomiting.  Passing gas but no BM so far. X-ray abdomen negative for any acute abnormality. Ultrasound abdomen negative for CBD stone.  At present we will treat pain with scheduled Bentyl, nightly gabapentin, as needed Toradol, as needed morphine and Tylenol. Explained to family that the patient may not have any organic etiology of her pain in the abdomen. Treat constipation with Senokot and MiraLAX. Appreciate GI assistance, recommend continue current medication. Diet advanced to soft diet.  Hypertensive urgency Blood pressure elevated possibly pain possibly anxiety.  Drop down significantly on night of 6/3 - 6/4 after receiving Norvasc 5 mg daily. Continue Norvasc 2.5 mg daily. Was on hydralazine 3 times daily although suspect in patient who is having recurrent GI issues medicine that needs to be administered multiple times a day would not control blood pressure adequately.  Pancreatic insufficiency We will  continue her Creon.  Morbid obesity ([place]) Monitor for now. Placing the pt at higher risk of poor outcomes.   GERD (gastroesophageal reflux disease) Continue PPI.  Patient was treated with IV PPI.  OSA on CPAP Continue CPAP nightly.   Mild renal insufficiency. Serum creatinine mildly elevated from 0.8-1.1. Currently receiving IV fluids. For now we will monitor.  With the family that the goal of hospital stay is to ensure that the patient is able to tolerate p.o. diet and maintain hydration and nutrition.  Subjective: Continues to report abdominal pain.  Also reports dry heaving.  Also appears to be sleepy.  Blood pressure was low last night.  Physical Exam: Vitals:   12/19/21 0614 12/19/21 0701 12/19/21 0839 12/19/21 1137  BP: (!) 86/48 127/67 (!) 170/98 (!) 180/108  Pulse: (!) 101 99 94 82  Resp:  '16 18 18  '$ Temp: 98 F (36.7 C)  97.9 F (36.6 C) 98.6 F (37 C)  TempSrc:   Oral   SpO2: 92% 97% 96% 100%   General: Appear in mild distress; no visible Abnormal Neck Mass Or lumps, Conjunctiva normal Cardiovascular: S1 and S2 Present, no Murmur, Respiratory: good respiratory effort, Bilateral Air entry present and  Occasional Crackles, Occasional wheezes Abdomen: Bowel Sound present, diffusely tender Extremities: no Pedal edema Neurology: alert and oriented to place and person  Gait not checked due to patient safety concerns   Data Reviewed: I have Reviewed nursing notes, Vitals, and Lab results since pt's last encounter. Pertinent lab results CBC and BMP I have ordered test including BMP and magnesium I have ordered imaging studies x-ray chest. I have discussed pt's care plan and test results with GI.   Family Communication: Sister at  bedside  Disposition: Status is: Inpatient Remains inpatient appropriate because: Monitor for improvement in oral intake  Author: Berle Mull, MD 12/19/2021 3:37 PM  Please look on [email or web address] to find out who is on call.

## 2021-12-19 NOTE — Progress Notes (Signed)
Notified MD of blood pressure 172/86. Received order for hydralazine

## 2021-12-19 NOTE — Plan of Care (Signed)

## 2021-12-20 DIAGNOSIS — R1084 Generalized abdominal pain: Secondary | ICD-10-CM | POA: Diagnosis not present

## 2021-12-20 DIAGNOSIS — R112 Nausea with vomiting, unspecified: Secondary | ICD-10-CM

## 2021-12-20 DIAGNOSIS — R1033 Periumbilical pain: Secondary | ICD-10-CM | POA: Diagnosis not present

## 2021-12-20 DIAGNOSIS — G8929 Other chronic pain: Secondary | ICD-10-CM | POA: Diagnosis not present

## 2021-12-20 LAB — CBC
HCT: 39.3 % (ref 36.0–46.0)
Hemoglobin: 12 g/dL (ref 12.0–15.0)
MCH: 27 pg (ref 26.0–34.0)
MCHC: 30.5 g/dL (ref 30.0–36.0)
MCV: 88.5 fL (ref 80.0–100.0)
Platelets: 187 10*3/uL (ref 150–400)
RBC: 4.44 MIL/uL (ref 3.87–5.11)
RDW: 17.9 % — ABNORMAL HIGH (ref 11.5–15.5)
WBC: 6.6 10*3/uL (ref 4.0–10.5)
nRBC: 0 % (ref 0.0–0.2)

## 2021-12-20 LAB — BASIC METABOLIC PANEL
Anion gap: 7 (ref 5–15)
BUN: 23 mg/dL (ref 8–23)
CO2: 29 mmol/L (ref 22–32)
Calcium: 9.2 mg/dL (ref 8.9–10.3)
Chloride: 107 mmol/L (ref 98–111)
Creatinine, Ser: 0.9 mg/dL (ref 0.44–1.00)
GFR, Estimated: >60 mL/min (ref >60)
Glucose, Bld: 122 mg/dL — ABNORMAL HIGH (ref 70–99)
Potassium: 3.7 mmol/L (ref 3.5–5.1)
Sodium: 143 mmol/L (ref 135–145)

## 2021-12-20 LAB — MAGNESIUM: Magnesium: 2.2 mg/dL (ref 1.7–2.4)

## 2021-12-20 MED ORDER — NITROGLYCERIN 2 % TD OINT
1.0000 [in_us] | TOPICAL_OINTMENT | Freq: Four times a day (QID) | TRANSDERMAL | Status: DC
  Administered 2021-12-20 – 2021-12-23 (×12): 1 [in_us] via TOPICAL
  Filled 2021-12-20 (×13): qty 1

## 2021-12-20 MED ORDER — METRONIDAZOLE 500 MG/100ML IV SOLN
500.0000 mg | Freq: Two times a day (BID) | INTRAVENOUS | Status: DC
  Administered 2021-12-20 – 2021-12-22 (×5): 500 mg via INTRAVENOUS
  Filled 2021-12-20 (×5): qty 100

## 2021-12-20 MED ORDER — SODIUM CHLORIDE 0.9 % IV SOLN
1.0000 g | INTRAVENOUS | Status: DC
  Administered 2021-12-20 – 2021-12-22 (×3): 1 g via INTRAVENOUS
  Filled 2021-12-20 (×3): qty 1

## 2021-12-20 MED ORDER — METRONIDAZOLE 500 MG PO TABS
500.0000 mg | ORAL_TABLET | Freq: Two times a day (BID) | ORAL | Status: DC
Start: 2021-12-20 — End: 2021-12-20

## 2021-12-20 MED ORDER — AMLODIPINE BESYLATE 5 MG PO TABS
5.0000 mg | ORAL_TABLET | Freq: Every day | ORAL | Status: DC
  Administered 2021-12-21: 5 mg via ORAL
  Filled 2021-12-20: qty 1

## 2021-12-20 MED ORDER — HYDRALAZINE HCL 20 MG/ML IJ SOLN: 10.0000 mg | INTRAMUSCULAR | Status: DC | PRN

## 2021-12-20 MED ORDER — PANTOPRAZOLE SODIUM 40 MG PO TBEC: 40.0000 mg | DELAYED_RELEASE_TABLET | Freq: Two times a day (BID) | ORAL | Status: DC

## 2021-12-20 MED ORDER — SODIUM CHLORIDE 0.9 % IV SOLN
500.0000 mg | Freq: Three times a day (TID) | INTRAVENOUS | Status: DC
  Administered 2021-12-20 – 2021-12-23 (×9): 500 mg via INTRAVENOUS
  Filled 2021-12-20 (×10): qty 10

## 2021-12-20 MED ORDER — PANTOPRAZOLE SODIUM 40 MG IV SOLR
40.0000 mg | INTRAVENOUS | Status: DC
  Administered 2021-12-20 – 2021-12-22 (×3): 40 mg via INTRAVENOUS
  Filled 2021-12-20 (×3): qty 10

## 2021-12-20 MED ORDER — HYDRALAZINE HCL 20 MG/ML IJ SOLN: 5.0000 mg | INTRAMUSCULAR | Status: DC | PRN

## 2021-12-20 MED ORDER — FUROSEMIDE 10 MG/ML IJ SOLN
20.0000 mg | Freq: Once | INTRAMUSCULAR | Status: DC
Start: 2021-12-20 — End: 2021-12-20
  Filled 2021-12-20: qty 4

## 2021-12-20 NOTE — Evaluation (Signed)
Occupational Therapy Evaluation Patient Details Name: Taylor Johnson MRN: [id number] DOB: 05/09/1946 Today's Date: 12/20/2021   History of Present Illness 76 y.o. African-American female with medical history significant for GERD, hypertension, and anxiety, obstructive sleep apnea on home CPAP, who presented to the ER with acute onset of epigastric and to lesser extent right upper quadrant abdominal pain for the last week with associated intermittent nausea and vomiting worsening with food intake.  Patient had an unremarkable EGD in January and has been following with Dr. Lysle Pearl for cholelithiasis on Tuesday when he recommended HIDA scan.  She denies any fever or chills.  No diarrhea or constipation.  No melena protruding per rectum.  She denied any bilious vomitus or hematemesis.  No chest pain dyspnea or cough.   Clinical Impression   Patient presenting with decreased ind in self care,balance, functional mobility/transfers, endurance, and safety awareness. Patient reports being mod I at baseline with use of SPC in community for mobility. Family reports pt is able to perform self care tasks and cook for herself at home. She lives with a niece but she is not able to assist pt at baseline. Her sister's verbalized being able to assist as needed at discharge. Pt appeared to be very slow processing and does not answer therapist questions. Family in room reports , "this is how she is in the hospital" and that she "will talk your ear off" at home. Pt ambulates while pushing IV pole with supervision- min guard into bathroom for toileting needs. Pt was on 3Ls via [place] when entering the room and placed on RA with O2 saturation at 91% and RN notified and pt placed back on 1L. Bed also saturated with urine and pt is very focused on using purewick to the point where it keeps her from performing mobility task. Purewick removed as pt can ambulate with staff into bathroom for toileting needs.This is discussed with pt, family,  and RN. Pt's bed linens are saturated and RN notified for change of linens. Patient will benefit from acute OT to increase overall independence in the areas of ADLs, functional mobility, and safety awareness in order to safely discharge home with assist as needed.      Recommendations for follow up therapy are one component of a multi-disciplinary discharge planning process, led by the attending physician.  Recommendations may be updated based on patient status, additional functional criteria and insurance authorization.   Follow Up Recommendations  Home health OT    Assistance Recommended at Discharge Intermittent Supervision/Assistance  Patient can return home with the following A little help with walking and/or transfers;A little help with bathing/dressing/bathroom;Assistance with cooking/housework;Direct supervision/assist for medications management;Assist for transportation;Help with stairs or ramp for entrance;Direct supervision/assist for financial management    Functional Status Assessment  Patient has had a recent decline in their functional status and demonstrates the ability to make significant improvements in function in a reasonable and predictable amount of time.  Equipment Recommendations  None recommended by OT       Precautions / Restrictions Precautions Precautions: Fall      Mobility Bed Mobility Overal bed mobility: Needs Assistance Bed Mobility: Supine to Sit, Sit to Supine     Supine to sit: Supervision Sit to supine: Supervision   General bed mobility comments: cuing for technique for safety awareness.    Transfers Overall transfer level: Needs assistance Equipment used: 1 person hand held assist Transfers: Sit to/from Stand, Bed to chair/wheelchair/BSC Sit to Stand: Supervision     Step  pivot transfers: Min guard            Balance Overall balance assessment: Needs assistance Sitting-balance support: Feet supported, Bilateral upper extremity  supported Sitting balance-Leahy Scale: Good     Standing balance support: Reliant on assistive device for balance, During functional activity Standing balance-Leahy Scale: Fair                             ADL either performed or assessed with clinical judgement   ADL Overall ADL's : Needs assistance/impaired                         Toilet Transfer: Min guard   Toileting- Clothing Manipulation and Hygiene: Min guard       Functional mobility during ADLs: Supervision/safety;Min guard       Vision Patient Visual Report: No change from baseline              Pertinent Vitals/Pain Pain Assessment Pain Assessment: Faces Faces Pain Scale: No hurt     Hand Dominance     Extremity/Trunk Assessment Upper Extremity Assessment Upper Extremity Assessment: Generalized weakness   Lower Extremity Assessment Lower Extremity Assessment: Generalized weakness       Communication Communication Communication: Expressive difficulties   Cognition Arousal/Alertness: Awake/alert Behavior During Therapy: Flat affect Overall Cognitive Status: Difficult to assess                                 General Comments: Pt is very slow to process and often not answering therapist questions at all. Her sisters present in the room reports this is "normal for her in the hospital" and that at home she will "talk you ears off".                Home Living Family/patient expects to be discharged to:: Private residence Living Arrangements: Other relatives Available Help at Discharge: Family;Available PRN/intermittently Type of Home: House Home Access: Ramped entrance     Home Layout: One level     Bathroom Shower/Tub: Tub/shower unit         Home Equipment: Cane - single point          Prior Functioning/Environment               Mobility Comments: Per sisters, pt uses SPC in community but nothing inside of home for functional mobility.  She denies furniture walking at home. ADLs Comments: Family reports she is able to perform ADLs and cooking independently at home. She does not drive and they assist her with appointments as needed.        OT Problem List: Decreased strength;Cardiopulmonary status limiting activity;Decreased cognition;Decreased activity tolerance;Decreased safety awareness;Impaired balance (sitting and/or standing)      OT Treatment/Interventions: Self-care/ADL training;Balance training;Therapeutic exercise;Therapeutic activities;DME and/or AE instruction;Manual therapy;Patient/family education;Energy conservation;Cognitive remediation/compensation    OT Goals(Current goals can be found in the care plan section) Acute Rehab OT Goals Patient Stated Goal: to get better and go home OT Goal Formulation: With patient Time For Goal Achievement: 01/03/22 Potential to Achieve Goals: Good ADL Goals Pt Will Perform Grooming: with modified independence;standing Pt Will Transfer to Toilet: with modified independence;ambulating Pt Will Perform Toileting - Clothing Manipulation and hygiene: with modified independence;sitting/lateral leans Pt Will Perform Tub/Shower Transfer: with modified independence;ambulating  OT Frequency: Min 2X/week       AM-PAC  OT "6 Clicks" Daily Activity     Outcome Measure Help from another person eating meals?: None Help from another person taking care of personal grooming?: A Little Help from another person toileting, which includes using toliet, bedpan, or urinal?: A Little Help from another person bathing (including washing, rinsing, drying)?: A Little Help from another person to put on and taking off regular upper body clothing?: None Help from another person to put on and taking off regular lower body clothing?: A Little 6 Click Score: 20   End of Session Nurse Communication: Mobility status;Other (comment) (bed linens saturated and O2 saturation)  Activity Tolerance: Patient  tolerated treatment well Patient left: in bed;with call bell/phone within reach;with bed alarm set;with family/visitor present  OT Visit Diagnosis: Unsteadiness on feet (R26.81);Repeated falls (R29.6);Muscle weakness (generalized) (M62.81)                Time: 9407-6808 OT Time Calculation (min): 29 min Charges:  OT General Charges $OT Visit: 1 Visit OT Evaluation $OT Eval Low Complexity: 1 Low OT Treatments $Self Care/Home Management : 8-22 mins  Darleen Crocker, MS, OTR/L , CBIS ascom [phone]  12/20/21, 3:53 PM

## 2021-12-20 NOTE — Evaluation (Signed)
Clinical/Bedside Swallow Evaluation Patient Details  Name: Taylor Johnson MRN: [id number] Date of Birth: September 06, 1945  Today's Date: 12/20/2021 Time: SLP Start Time (ACUTE ONLY): 1155 SLP Stop Time (ACUTE ONLY): 1240 SLP Time Calculation (min) (ACUTE ONLY): 45 min  Past Medical History:  Past Medical History:  Diagnosis Date   Abdominal pain 01/27/2017   Anxiety    Anxiety, generalized 05/11/2015   Cough 06/02/2016   Chronic - followed by Pulmonary   Cough 06/02/2016   Chronic - followed by Pulmonary   Depression    Developmental delay    Diverticulitis    Diverticulosis of colon 05/11/2015   Dysphagia 10/15/2015   Routine Ba Swallow normal; rec modified barium swallow    GERD (gastroesophageal reflux disease)    Hematochezia 11/07/2019   Hypertension    Hypoxia 09/12/2015   Leg swelling    Localized edema 04/15/2016   Microscopic hematuria 04/15/2016   Nausea and vomiting 01/29/2017   Orthostatic hypotension 03/16/2017   OSA on CPAP 11/18/2015   CPAP @ 18 cm H2O started 02/2016   Rectal bleeding 10/15/2018   Urge incontinence of urine 05/12/2015   Past Surgical History:  Past Surgical History:  Procedure Laterality Date   ABDOMINAL HYSTERECTOMY     BREAST BIOPSY Left    neg   COLON SURGERY     COLONOSCOPY WITH PROPOFOL N/A 12/13/2019   Procedure: COLONOSCOPY WITH PROPOFOL;  Surgeon: Lin Landsman, MD;  Location: [place];  Service: Endoscopy;  Laterality: N/A;   ESOPHAGOGASTRODUODENOSCOPY (EGD) WITH PROPOFOL N/A 01/29/2017   Procedure: ESOPHAGOGASTRODUODENOSCOPY (EGD) WITH PROPOFOL;  Surgeon: Wilford Corner, MD;  Location: [facility] ENDOSCOPY;  Service: Endoscopy;  Laterality: N/A;   ESOPHAGOGASTRODUODENOSCOPY (EGD) WITH PROPOFOL N/A 08/02/2021   Procedure: ESOPHAGOGASTRODUODENOSCOPY (EGD) WITH PROPOFOL;  Surgeon: Lin Landsman, MD;  Location: [facility] ENDOSCOPY;  Service: Gastroenterology;  Laterality: N/A;   HPI:  Pt is a 76 year old female with past medical  history of GERD, chronic cough, HTN, chronic abdominal pain, chronic Reglan use, OSA on CPAP, developmental delay, Diverticulitis,  Diverticulosis of colon who presents to the hospital with complaints of recurrent abdominal pain in epigastric region for last 2 weeks.  HIDA scan was performed which is negative.  General surgery was consulted also recommended no intervention.  GI consulted.  Started on gabapentin.  Due to drowsiness currently gabapentin is discontinued.  GI has switch from IV Reglan to IV erythromycin and GI recommended to start the patient on IV antibiotics for possible intra-abdominal infection.  Patient has been chronically on Reglan, although gastroparesis diagnosis has not been confirmed in the past.    CXR: Similar cardiomegaly and chronic peribronchial thickening. No acute abnormality.    Assessment / Plan / Recommendation  Clinical Impression  Pt seen for BSE. She is currently followed by GI for ongoing c/o abdominal pain and N/V issues. Pt has a h/o chronic Reglan use per chart.  Pt only accepted/given trials of liquids d/t GI discomfort per chart/pt report. Encouragement given to take further, but pt declined.   Pt appears to present w/ adequate oropharyngeal phase swallow function w/ trials of thin liquids -- no solids were assessed at this evaluation d/t pt's GI status and restricted diet (recently clear liquids per MD).  NO overt clinical s/s of aspiration were noted; no neuromuscular deficits noted. Pt appears at reduced risk for aspiration/aspiration pneumonia from an oropharyngeal phase standpoint following general aspiration precautions. HOWEVER, pt could be at risk for aspiration of REFLUX material (thus pulmonary decline) from an  Esophageal Regurgitation standpoint d/t her GI issues currently/chronically.   Pt was apprehensive about taking any oral intake/bolus trials w/ this Clinician. She expectorated ~1 bolus volume amount during multiple sips via straw. No overt  coughing; just expectoration noted. She appeared to complete 1-2 full swallows followed then by oral holding, swishing, and expectoration of remainder. She stated she "didn't want it" and "had to spit it out". She denied any odynophagia. Pt consumed ice chips and tsps of water w/ this Clinician feeding her w/ appropriate oropharyngeal phase swallowing w/ no expectoration and No overt, clinical s/s of aspiration. Oral phase was appropriate for bolus management, A-P transfer, and oral clearing of these trials. No overt clinical s/s of aspiration were noted. Pt refused any other trials of liquids (soda, juice) and foods (pudding, applesauce). Assured pt's Sister, pt, and MD that pt's oropharyngeal swallowing was functional/intact as she was managing her own saliva as well.   The above was addressed w/ MD who entered room to discuss pt's medical/GI status w/ pt/Sister in room. Recommend general aspiration precautions w/ support at meals w/ positioning Upright and feeding as needed; Pills in a Puree if needed for ease of swallowing per NSG.  MD to monitor pt's GI/medical status and progress pt to an oral diet as appropriate medically(per MD). No further skilled ST needs at this time.  SLP Visit Diagnosis: Dysphagia, unspecified (R13.10) (GI discomfort currently)    Aspiration Risk   (reduced when following general precautions)    Diet Recommendation  Diet TBD by GI/MD d/t pt's GI discomfort and regurgitation; can include thin liquids  Medication Administration: Crushed with puree (if unable to tolerated w/ sip of water (recommend 1 pill at a time))    Other  Recommendations Recommended Consults: Consider GI evaluation;Consider esophageal assessment (ongoing) Oral Care Recommendations: Oral care BID;Oral care before and after PO;Staff/trained caregiver to provide oral care Other Recommendations:  (n/a)    Recommendations for follow up therapy are one component of a multi-disciplinary discharge planning  process, led by the attending physician.  Recommendations may be updated based on patient status, additional functional criteria and insurance authorization.  Follow up Recommendations No SLP follow up      Assistance Recommended at Discharge Set up Supervision/Assistance  Functional Status Assessment Patient has had a recent decline in their functional status and demonstrates the ability to make significant improvements in function in a reasonable and predictable amount of time.  Frequency and Duration  (n/a)   (n/a)       Prognosis Prognosis for Safe Diet Advancement: Fair (-Good) Barriers to Reach Goals: Time post onset;Severity of deficits;Behavior;Motivation Barriers/Prognosis Comment: GI issues impacting desire for oral intake      Swallow Study   General Date of Onset: 12/16/21 HPI: Pt is a 76 year old female with past medical history of GERD, chronic cough, HTN, chronic abdominal pain, chronic Reglan use, OSA on CPAP, developmental delay, Diverticulitis,  Diverticulosis of colon who presents to the hospital with complaints of recurrent abdominal pain in epigastric region for last 2 weeks.  HIDA scan was performed which is negative.  General surgery was consulted also recommended no intervention.  GI consulted.  Started on gabapentin.  Due to drowsiness currently gabapentin is discontinued.  GI has switch from IV Reglan to IV erythromycin and GI recommended to start the patient on IV antibiotics for possible intra-abdominal infection.  Patient has been chronically on Reglan, although gastroparesis diagnosis has not been confirmed in the past.   CXR: Similar cardiomegaly and  chronic peribronchial thickening. No acute abnormality. Type of Study: Bedside Swallow Evaluation Previous Swallow Assessment: MBSS in 2018 w/ NORMAL oropharyngeal phase swallowing. Diet Prior to this Study: Thin liquids (clear liquid diet yesterday per GI; has been on a mech soft diet per MD) Temperature Spikes  Noted: No (wbc 6.6) Respiratory Status: Nasal cannula (3L) History of Recent Intubation: No Behavior/Cognition: Alert;Cooperative;Pleasant mood;Distractible;Requires cueing Oral Cavity Assessment: Within Functional Limits Oral Care Completed by SLP: Recent completion by staff Oral Cavity - Dentition: Missing dentition (few) Vision: Functional for self-feeding Self-Feeding Abilities: Needs assist;Needs set up;Total assist Patient Positioning: Upright in bed (needed positioning support) Baseline Vocal Quality: Normal;Low vocal intensity Volitional Cough: Cognitively unable to elicit Volitional Swallow: Unable to elicit    Oral/Motor/Sensory Function Overall Oral Motor/Sensory Function: Within functional limits (w/ bolus management; no unilateral weakness noted)   Ice Chips Ice chips: Within functional limits Presentation: Spoon (fed; 4 trials)   Thin Liquid Thin Liquid: Within functional limits Presentation: Spoon;Straw (2 trials; 3 swallows via straw) Other Comments: pt expectorated ~1 bolus amount -- noted 1 full swallow then oral holding, swishing, and expectoration of remainder. She stated she "didn't want it" and had to spit it out.    Nectar Thick Nectar Thick Liquid: Not tested   Honey Thick Honey Thick Liquid: Not tested   Puree Puree: Not tested   Solid     Solid: Not tested         Orinda Kenner, MS, CCC-SLP Speech Language Pathologist Rehab Services; [place] [phone] (ascom) Issa Luster 12/20/2021,6:04 PM

## 2021-12-20 NOTE — Progress Notes (Addendum)
  Progress Note Patient: Taylor Johnson ZOX:[id number] DOB: 19-Nov-1945 DOA: 12/16/2021  DOS: the patient was seen and examined on 12/20/2021  Brief hospital course: 76 year old female with past medical history of GERD, HTN, chronic abdominal pain, chronic Reglan use, OSA on CPAP presents to the hospital with complaints of recurrent abdominal pain in epigastric region for last 2 weeks. HIDA scan was performed which is negative.  General surgery was consulted also recommended no intervention. GI consulted.  Started on gabapentin.  Due to drowsiness currently gabapentin is discontinued. GI has switch from IV Reglan to IV erythromycin and GI recommended to start the patient on IV antibiotics for possible intra-abdominal infection.  No imaging recommended.  Assessment and Plan: * Abdominal pain, chronic, generalized Possible intra-abdominal infection/diverticulitis. Gastroparesis. Presents to the hospital with complaints of 2 weeks of abdominal pain. Initially suspected to be having biliary colic. Although LFTs were normal.  HIDA scan normal. Thus patient does not appear to have any biliary colic. General surgery Dr. Lysle Pearl recommended no intervention. X-ray abdomen negative for any acute abnormality. Ultrasound abdomen negative for CBD stone.  GI consulted.  Currently on IV erythromycin for possible gastroparesis.  Also on IV ceftriaxone and Flagyl for possible diverticulitis.  Remains n.p.o. for now.  Avoiding IV fluids due to concern for volume overload.  Hypertensive urgency Blood pressure elevated possibly pain possibly anxiety.  Drop down significantly on night of 6/3 - 6/4 after receiving Norvasc 5 mg daily. Continue Norvasc. Was on hydralazine 3 times daily although suspect in patient who is having recurrent GI issues medicine that needs to be administered multiple times a day would not control blood pressure adequately.  Pancreatic insufficiency We will continue her Creon.  Morbid obesity  ([place]) Body mass index is 39.77 kg/m.  Placing the pt at higher risk of poor outcomes.   GERD (gastroesophageal reflux disease) Continue PPI.  Patient was treated with IV PPI.  OSA on CPAP Continue CPAP nightly.  Subjective: Continues to have some nausea continues to have abdominal pain.  Continues to have dry heaving without any large-volume vomitus.  Passing gas.  No BM.  Physical Exam: Vitals:   12/20/21 0730 12/20/21 1144 12/20/21 1222 12/20/21 1702  BP: (!) 190/90 (!) 192/82 (!) 138/107 (!) 176/86  Pulse: 82 67 74 92  Resp: '16 15 18 18  '$ Temp: 97.8 F (36.6 C) 98.9 F (37.2 C) 98 F (36.7 C) 98.5 F (36.9 C)  TempSrc: Axillary Axillary Axillary   SpO2:  100% 100% (!) 78%  Weight:       General: Appear in mild distress; no visible Abnormal Neck Mass Or lumps, Conjunctiva normal Cardiovascular: S1 and S2 Present, no Murmur, Respiratory: good respiratory effort, Bilateral Air entry present and CTA, no Crackles, no wheezes Abdomen: Bowel Sound present, Non tender Extremities: bilateral Pedal edema Neurology: alert and oriented to time, place, and person Gait not checked due to patient safety concerns   Data Reviewed: I have Reviewed nursing notes, Vitals, and Lab results since pt's last encounter. Pertinent lab results CBC and BMP I have ordered test including CBC and BMP I have discussed pt's care plan and test results with gastroenterology.   Family Communication: Discussed with sister on the phone  Disposition: Status is: Inpatient Remains inpatient appropriate because: Poor p.o. intake requiring IV therapies.  Author: Berle Mull, MD 12/20/2021 5:22 PM  Please look on [email or web address] to find out who is on call.

## 2021-12-21 DIAGNOSIS — G8929 Other chronic pain: Secondary | ICD-10-CM | POA: Diagnosis not present

## 2021-12-21 DIAGNOSIS — R112 Nausea with vomiting, unspecified: Secondary | ICD-10-CM | POA: Diagnosis not present

## 2021-12-21 DIAGNOSIS — R1033 Periumbilical pain: Secondary | ICD-10-CM

## 2021-12-21 DIAGNOSIS — R1084 Generalized abdominal pain: Secondary | ICD-10-CM | POA: Diagnosis not present

## 2021-12-21 LAB — CBC
HCT: 41.8 % (ref 36.0–46.0)
Hemoglobin: 12.8 g/dL (ref 12.0–15.0)
MCH: 27.2 pg (ref 26.0–34.0)
MCHC: 30.6 g/dL (ref 30.0–36.0)
MCV: 88.9 fL (ref 80.0–100.0)
Platelets: 188 10*3/uL (ref 150–400)
RBC: 4.7 MIL/uL (ref 3.87–5.11)
RDW: 17.2 % — ABNORMAL HIGH (ref 11.5–15.5)
WBC: 6.5 10*3/uL (ref 4.0–10.5)
nRBC: 0 % (ref 0.0–0.2)

## 2021-12-21 LAB — BASIC METABOLIC PANEL
Anion gap: 9 (ref 5–15)
BUN: 14 mg/dL (ref 8–23)
CO2: 35 mmol/L — ABNORMAL HIGH (ref 22–32)
Calcium: 9.2 mg/dL (ref 8.9–10.3)
Chloride: 93 mmol/L — ABNORMAL LOW (ref 98–111)
Creatinine, Ser: 0.6 mg/dL (ref 0.44–1.00)
GFR, Estimated: >60 mL/min (ref >60)
Glucose, Bld: 120 mg/dL — ABNORMAL HIGH (ref 70–99)
Potassium: 3.1 mmol/L — ABNORMAL LOW (ref 3.5–5.1)
Sodium: 137 mmol/L (ref 135–145)

## 2021-12-21 LAB — MAGNESIUM: Magnesium: 1.9 mg/dL (ref 1.7–2.4)

## 2021-12-21 MED ORDER — SODIUM CHLORIDE 0.9 % IV SOLN: INTRAVENOUS | Status: DC | PRN

## 2021-12-21 MED ORDER — OLANZAPINE 5 MG PO TABS
2.5000 mg | ORAL_TABLET | Freq: Every day | ORAL | Status: DC
  Administered 2021-12-21 – 2021-12-22 (×2): 2.5 mg via ORAL
  Filled 2021-12-21 (×2): qty 1

## 2021-12-21 MED ORDER — BISACODYL 10 MG RE SUPP
10.0000 mg | Freq: Every day | RECTAL | Status: DC | PRN
  Filled 2021-12-21: qty 1

## 2021-12-21 MED ORDER — POTASSIUM CHLORIDE 10 MEQ/100ML IV SOLN
10.0000 meq | INTRAVENOUS | Status: AC
  Administered 2021-12-21 (×5): 10 meq via INTRAVENOUS
  Filled 2021-12-21 (×4): qty 100

## 2021-12-21 NOTE — Progress Notes (Addendum)
[name], MD [address]  [place]  [place], [place] [address]  Main: [phone]  Fax: [phone] Pager: [phone]   Subjective: Patient has been maintained n.p.o. and no acute events overnight.  Patient reports gurgling in her stomach.  She is minimally verbal, reports that she does not feel hungry.  She denies any abdominal pain, nausea or vomiting.  Her nurse is applying suppository   Objective: Vital signs in last 24 hours: Vitals:   12/21/21 0827 12/21/21 0834 12/21/21 1134 12/21/21 1657  BP: (!) 190/87 (!) 176/90 (!) 159/89 (!) 173/80  Pulse: 73 70 68 75  Resp: '16 15 18 16  '$ Temp: 97.8 F (36.6 C) 98.2 F (36.8 C) 98.8 F (37.1 C) 98.2 F (36.8 C)  TempSrc:   Axillary   SpO2: 99% 97% 100% 100%  Weight:       Weight change:   Intake/Output Summary (Last 24 hours) at 12/21/2021 1757 Last data filed at 12/21/2021 1510 Gross per 24 hour  Intake 300 ml  Output 200 ml  Net 100 ml    Exam: Heart:: Regular rate and rhythm, S1S2 present, or without murmur or extra heart sounds Lungs: normal and clear to auscultation Abdomen: soft, nontender, normal bowel sounds   Lab Results: '@LABTEST2'$ @ Micro Results: No results found for this or any previous visit (from the past 240 hour(s)). Studies/Results: No results found. Medications: I have reviewed the patient's current medications. Prior to Admission:  Medications Prior to Admission  Medication Sig Dispense Refill Last Dose   dicyclomine (BENTYL) 20 MG tablet Take 1 tablet (20 mg total) by mouth every 8 (eight) hours as needed for spasms (Abdominal cramping). 15 tablet 0 prn at prn   hydrALAZINE (APRESOLINE) 25 MG tablet TAKE ONE TABLET BY MOUTH THREE TIMES DAILY 90 tablet 9 12/16/2021   lipase/protease/amylase (CREON) 36000 UNITS CPEP capsule Take 2 capsules with the first bite of each meal and 1 capsule with the first bite of each snack 240 capsule 2 12/17/2021   meclizine (ANTIVERT) 12.5 MG tablet  TAKE ONE TABLET BY MOUTH THREE TIMES DAILY AS NEEDED FOR DIZZINESS 30 tablet 2 prn at prn   omeprazole (PRILOSEC) 40 MG capsule Take 1 capsule (40 mg total) by mouth 2 (two) times daily before a meal for 30 days, THEN 1 capsule (40 mg total) daily. 150 capsule 0 12/16/2021   ondansetron (ZOFRAN-ODT) 4 MG disintegrating tablet Take 1 tablet (4 mg total) by mouth every 8 (eight) hours as needed for nausea or vomiting. 20 tablet 0 prn at prn   Saccharomyces boulardii (PROBIOTIC) 250 MG CAPS Take 1 capsule by mouth in the morning and at bedtime. 30 capsule 0 12/16/2021   traMADol (ULTRAM) 50 MG tablet Take 50 mg by mouth 3 (three) times daily as needed.   prn at prn   metoCLOPramide (REGLAN) 5 MG tablet TAKE ONE TABLET BY MOUTH EVERY MORNING, NOON, EVENING (Patient not taking: Reported on 12/16/2021) 90 tablet 2 Not Taking   Scheduled:  amLODipine  5 mg Oral Daily   chlorhexidine  15 mL Mouth Rinse BID   enoxaparin (LOVENOX) injection  0.5 mg/kg Subcutaneous Q24H   feeding supplement  1 Container Oral TID BM   mouth rinse  15 mL Mouth Rinse q12n4p   nitroGLYCERIN  1 inch Topical Q6H   OLANZapine  2.5 mg Oral QHS   pantoprazole (PROTONIX) IV  40 mg Intravenous Q24H   senna-docusate  1 tablet Oral BID   Continuous:  cefTRIAXone (ROCEPHIN)  IV 1 g (12/21/21 1227)   erythromycin 500 mg (12/21/21 1507)   metronidazole 500 mg (12/21/21 1340)   ZOX:[id number] **OR** acetaminophen, bisacodyl, guaiFENesin-dextromethorphan, hydrALAZINE, ketorolac, morphine injection, ondansetron **OR** ondansetron (ZOFRAN) IV Anti-infectives (From admission, onward)    Start     Dose/Rate Route Frequency Ordered Stop   12/20/21 1200  erythromycin 500 mg in sodium chloride 0.9 % 100 mL IVPB        500 mg 100 mL/hr over 60 Minutes Intravenous Every 8 hours 12/20/21 1033     12/20/21 1200  metroNIDAZOLE (FLAGYL) IVPB 500 mg        500 mg 100 mL/hr over 60 Minutes Intravenous Every 12 hours 12/20/21 1109     12/20/21  1145  cefTRIAXone (ROCEPHIN) 1 g in sodium chloride 0.9 % 100 mL IVPB        1 g 200 mL/hr over 30 Minutes Intravenous Every 24 hours 12/20/21 1046     12/20/21 1145  metroNIDAZOLE (FLAGYL) tablet 500 mg  Status:  Discontinued        500 mg Oral Every 12 hours 12/20/21 1046 12/20/21 1109      Scheduled Meds:  amLODipine  5 mg Oral Daily   chlorhexidine  15 mL Mouth Rinse BID   enoxaparin (LOVENOX) injection  0.5 mg/kg Subcutaneous Q24H   feeding supplement  1 Container Oral TID BM   mouth rinse  15 mL Mouth Rinse q12n4p   nitroGLYCERIN  1 inch Topical Q6H   OLANZapine  2.5 mg Oral QHS   pantoprazole (PROTONIX) IV  40 mg Intravenous Q24H   senna-docusate  1 tablet Oral BID   Continuous Infusions:  cefTRIAXone (ROCEPHIN)  IV 1 g (12/21/21 1227)   erythromycin 500 mg (12/21/21 1507)   metronidazole 500 mg (12/21/21 1340)   PRN Meds:.acetaminophen **OR** acetaminophen, bisacodyl, guaiFENesin-dextromethorphan, hydrALAZINE, ketorolac, morphine injection, ondansetron **OR** ondansetron (ZOFRAN) IV   Assessment: Principal Problem:   Abdominal pain, chronic, generalized Active Problems:   OSA on CPAP   Benign essential HTN   GERD (gastroesophageal reflux disease)   Morbid obesity (HCC)   Hypertensive urgency   Pancreatic insufficiency  Taylor Johnson is a 76 y.o. 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  Patient is found to have 1.6 cm stone in the proximal gallbladder.  LFTs have been normal, serum lipase normal.  HIDA scan normal.   Differentials include functional dyspepsia or flareup of gastroparesis or viral gastritis or symptomatic cholelithiasis or functional abdominal pain.   Patient's pain is periumbilical and other possibility is mild acute diverticulitis given her history of pancolonic diverticulosis Patient is evaluated by general surgery, did not recommend cholecystectomy because of atypical symptoms.  Patient already had extensive work-up till date and nothing has panned out.  She already had multiple imaging studies and has been unrevealing.  Therefore, her symptoms are functional at this point  Plan: Patient is currently being treated for all of the above differentials  Continue IV erythromycin 500 mg 3 times daily for possible flareup of gastroparesis Continue Protonix to 40 mg p.o. twice daily Stopped Bentyl, Creon Continue Zofran or Phenergan as needed for nausea Continue MiraLAX Continue empiric trial of antibiotics for acute diverticulitis with ceftriaxone and Flagyl for possible mild acute diverticulitis which  is also less likely Do not recommend any cross-sectional imaging at this time given patient does not have fever or leukocytosis or abdominal tenderness Advance to clear liquid diet today, advance to small frequent meals only.  Avoid red meat, processed foods as outpatient Recommend to consult psychiatry to evaluate for any underlying undiagnosed psychological condition which could be playing a role in her ongoing and intermittent GI symptoms Consider to start Zyprexa 5 mg daily at bedtime No further recommendations from GI standpoint at this time Discussed my thought process and recommendations with Dr. Posey Pronto who is in agreement with the plan.  Will communicate the same with patient's sister    LOS: 5 days   Taylor Johnson 12/21/2021, 5:57 PM

## 2021-12-21 NOTE — Evaluation (Signed)
Physical Therapy Evaluation Patient Details Name: Taylor Johnson MRN: [id number] DOB: [birth year] Today's Date: 12/21/2021  History of Present Illness  76 yo female with medical history significant for GERD, hypertension, and anxiety, obstructive sleep apnea on home CPAP, who presented to the ER with acute onset of epigastric and to lesser extent right upper quadrant abdominal pain for the last week with associated intermittent nausea and vomiting worsening with food intake.  Patient had an unremarkable EGD in January and has been following with Dr. Lysle Pearl for cholelithiasis on Tuesday when he recommended HIDA scan.  She denies any fever or chills.  No diarrhea or constipation.  No melena protruding per rectum.  She denied any bilious vomitus or hematemesis.  No chest pain dyspnea or cough.  Clinical Impression  Pt with flat affect and at times difficult to motivate but ultimately she was willing to do some walking with PT (using walker, cane at baseline).  Pt showed good overall mobility and was able to ambulate ~50 ft with what seemed to be relative ease, but then asking to sit multiple times before ultimately sitting down and not willing to do more.  Pt reports feeling close to her baseline, never-the-less she would benefit from HHPT when she returns home after medical clearance for d/c.      Recommendations for follow up therapy are one component of a multi-disciplinary discharge planning process, led by the attending physician.  Recommendations may be updated based on patient status, additional functional criteria and insurance authorization.  Follow Up Recommendations Home health PT    Assistance Recommended at Discharge Intermittent Supervision/Assistance  Patient can return home with the following  Assistance with cooking/housework;Assist for transportation;Help with stairs or ramp for entrance    Equipment Recommendations None recommended by PT (may need to insure she does actually have a  walqker)  Recommendations for Other Services       Functional Status Assessment Patient has had a recent decline in their functional status and demonstrates the ability to make significant improvements in function in a reasonable and predictable amount of time.     Precautions / Restrictions Precautions Precautions: Fall Restrictions Weight Bearing Restrictions: No      Mobility  Bed Mobility Overal bed mobility: Modified Independent Bed Mobility: Supine to Sit     Supine to sit: Supervision     General bed mobility comments: Pt easily gets herself to sitting at EOB w/o assist or hesitation    Transfers Overall transfer level: Needs assistance Equipment used: Rolling walker (2 wheels) Transfers: Sit to/from Stand, Bed to chair/wheelchair/BSC Sit to Stand: Supervision           General transfer comment: Pt was able to rise to standing w/o assist    Ambulation/Gait Ambulation/Gait assistance: Supervision Gait Distance (Feet): 50 Feet Assistive device: Rolling walker (2 wheels)         General Gait Details: Pt able to ambulate with slow but confident cadence.  She used walker with confidence (states she has one, but uses cane mostly at baseline).  Pt was not motivated to go very far and needed some consistent encouargement to do what she did... though she has no safety issues or excessive fatigue with the issue.  Stairs            Wheelchair Mobility    Modified Rankin (Stroke Patients Only)       Balance Overall balance assessment: Needs assistance Sitting-balance support: Feet supported, Bilateral upper extremity supported Sitting balance-Leahy Scale: Good  Standing balance support: Reliant on assistive device for balance, During functional activity Standing balance-Leahy Scale: Fair                               Pertinent Vitals/Pain Pain Assessment Pain Assessment: Faces Faces Pain Scale: Hurts a little bit Pain Location:  some minimal hesitancy with transitions, unable to rate, locate, etc    Home Living Family/patient expects to be discharged to:: Private residence Living Arrangements: Other relatives Available Help at Discharge: Family;Available PRN/intermittently Type of Home: House Home Access: Ramped entrance       Home Layout: One level Home Equipment: Conservation officer, nature (2 wheels);Cane - single point (may want to check that she does, indeed, have a walker)      Prior Function Prior Level of Function : Independent/Modified Independent             Mobility Comments: Per documentation, pt uses SPC in community but nothing inside of home for functional mobility. She denies furniture walking at home.       Hand Dominance        Extremity/Trunk Assessment   Upper Extremity Assessment Upper Extremity Assessment: Overall WFL for tasks assessed;Generalized weakness;Difficult to assess due to impaired cognition    Lower Extremity Assessment Lower Extremity Assessment: Overall WFL for tasks assessed;Generalized weakness;Difficult to assess due to impaired cognition       Communication   Communication: Expressive difficulties  Cognition Arousal/Alertness: Awake/alert Behavior During Therapy: Flat affect Overall Cognitive Status: Difficult to assess                                 General Comments: Per her baseline, very slow to process and often not answering therapist questions at all.        General Comments      Exercises     Assessment/Plan    PT Assessment Patient needs continued PT services  PT Problem List Decreased activity tolerance;Decreased knowledge of use of DME;Decreased safety awareness;Decreased cognition;Decreased strength;Decreased range of motion;Decreased balance;Decreased mobility       PT Treatment Interventions Gait training;Stair training;Functional mobility training;Therapeutic activities;Therapeutic exercise;Balance training;Patient/family  education;Neuromuscular re-education;DME instruction    PT Goals (Current goals can be found in the Care Plan section)  Acute Rehab PT Goals Patient Stated Goal: Go home PT Goal Formulation: With patient Time For Goal Achievement: 01/04/22 Potential to Achieve Goals: Good    Frequency Min 2X/week     Co-evaluation               AM-PAC PT "6 Clicks" Mobility  Outcome Measure Help needed turning from your back to your side while in a flat bed without using bedrails?: None Help needed moving from lying on your back to sitting on the side of a flat bed without using bedrails?: None Help needed moving to and from a bed to a chair (including a wheelchair)?: None Help needed standing up from a chair using your arms (e.g., wheelchair or bedside chair)?: None Help needed to walk in hospital room?: A Little Help needed climbing 3-5 steps with a railing? : A Little 6 Click Score: 22    End of Session Equipment Utilized During Treatment: Gait belt Activity Tolerance: Patient tolerated treatment well Patient left: with chair alarm set;with call bell/phone within reach;with nursing/sitter in room Nurse Communication: Mobility status PT Visit Diagnosis: Unsteadiness on feet (R26.81);Muscle weakness (generalized) (  M62.81);Difficulty in walking, not elsewhere classified (R26.2)    Time: 7564-3329 PT Time Calculation (min) (ACUTE ONLY): 25 min   Charges:   PT Evaluation $PT Eval Low Complexity: 1 Low PT Treatments $Gait Training: 8-22 mins        Kreg Shropshire, DPT 12/21/2021, 9:58 AM

## 2021-12-21 NOTE — Progress Notes (Signed)
Progress Note Patient: Taylor Johnson DJT:[id number] DOB: [birth year] DOA: 12/16/2021  DOS: the patient was seen and examined on 12/21/2021  Brief hospital course: 76 year old female with past medical history of GERD, HTN, chronic abdominal pain, chronic Reglan use, OSA on CPAP presents to the hospital with complaints of recurrent abdominal pain in epigastric region for last 2 weeks. HIDA scan was performed which is negative.  General surgery was consulted also recommended no intervention. GI consulted.  Started on gabapentin.  Due to drowsiness currently gabapentin is discontinued. GI has switch from IV Reglan to IV erythromycin and GI recommended to start the patient on IV antibiotics for possible intra-abdominal infection.  No imaging recommended. Psychiatric consult on 6/6 to assist with possible somatization. Assessment and Plan: * Abdominal pain, chronic, generalized Possible intra-abdominal infection/diverticulitis. Gastroparesis. Possible somatization Presents to the hospital with complaints of 2 weeks of abdominal pain. Initially suspected to be having biliary colic. Although LFTs were normal.  HIDA scan normal. Thus patient does not appear to have any biliary colic. General surgery Dr. Lysle Pearl recommended no intervention. X-ray abdomen negative for any acute abnormality. Ultrasound abdomen negative for CBD stone. GI consulted.    Currently on IV erythromycin for possible gastroparesis.   On IV ceftriaxone and Flagyl for possible diverticulitis.  Diet advanced to clear liquid diet. Continue scheduled Bentyl.  As needed Zofran.  PPI.  And bowel regimen. Psychiatry consult for possible somatization issues.  Hypertensive urgency Blood pressure remains elevated. Continue Norvasc and nitroglycerin ointment. I do not think that hydralazine 3 times daily is a good idea in a patient with healing GI issues and nausea.  Discontinue.  Pancreatic insufficiency We will continue her  Creon.  Morbid obesity ([place]) Body mass index is 39.77 kg/m.  Placing the pt at higher risk of poor outcomes.   GERD (gastroesophageal reflux disease) Continue PPI.  Patient was treated with IV PPI.  OSA on CPAP Unable to continue CPAP as patient's has ongoing complaint of frequent nausea and tendency to spit/dry heaves. Continue oxygen.  Subjective: Continues to report nausea but no vomiting.  Progress report abdominal pain but no tenderness.  Had a bowel movement.  No blood in the stool.  Minimal oral intake despite advancement to clear liquid diet.  Physical Exam: Vitals:   12/21/21 0827 12/21/21 0834 12/21/21 1134 12/21/21 1657  BP: (!) 190/87 (!) 176/90 (!) 159/89 (!) 173/80  Pulse: 73 70 68 75  Resp: '16 15 18 16  '$ Temp: 97.8 F (36.6 C) 98.2 F (36.8 C) 98.8 F (37.1 C) 98.2 F (36.8 C)  TempSrc:   Axillary   SpO2: 99% 97% 100% 100%  Weight:       General: Appear in mild distress; no visible Abnormal Neck Mass Or lumps, Conjunctiva normal Cardiovascular: S1 and S2 Present, no Murmur, Respiratory: good respiratory effort, Bilateral Air entry present and CTA, no Crackles, no wheezes Abdomen: Bowel Sound present, Non tender  Extremities: no Pedal edema Neurology: alert and oriented to time, place, and person flat affect, Gait not checked due to patient safety concerns   Data Reviewed: I have Reviewed nursing notes, Vitals, and Lab results since pt's last encounter. Pertinent lab results CBC and BMP I have ordered test including CBC and BMP I have discussed pt's care plan and test results with GI.   Family Communication: Sister at bedside  Disposition: Status is: Inpatient Remains inpatient appropriate because: Still minimal oral intake.  Monitor for improvement in oral intake.  Author: Berle Mull, MD 12/21/2021  7:40 PM  Please look on [email or web address] to find out who is on call.

## 2021-12-22 DIAGNOSIS — R1084 Generalized abdominal pain: Secondary | ICD-10-CM | POA: Diagnosis not present

## 2021-12-22 DIAGNOSIS — G8929 Other chronic pain: Secondary | ICD-10-CM | POA: Diagnosis not present

## 2021-12-22 LAB — BASIC METABOLIC PANEL
Anion gap: 8 (ref 5–15)
BUN: 17 mg/dL (ref 8–23)
CO2: 36 mmol/L — ABNORMAL HIGH (ref 22–32)
Calcium: 9 mg/dL (ref 8.9–10.3)
Chloride: 91 mmol/L — ABNORMAL LOW (ref 98–111)
Creatinine, Ser: 0.99 mg/dL (ref 0.44–1.00)
GFR, Estimated: 59 mL/min — ABNORMAL LOW (ref >60)
Glucose, Bld: 111 mg/dL — ABNORMAL HIGH (ref 70–99)
Potassium: 3.2 mmol/L — ABNORMAL LOW (ref 3.5–5.1)
Sodium: 135 mmol/L (ref 135–145)

## 2021-12-22 LAB — MAGNESIUM: Magnesium: 1.8 mg/dL (ref 1.7–2.4)

## 2021-12-22 LAB — CBC
HCT: 41.5 % (ref 36.0–46.0)
Hemoglobin: 12.8 g/dL (ref 12.0–15.0)
MCH: 27.1 pg (ref 26.0–34.0)
MCHC: 30.8 g/dL (ref 30.0–36.0)
MCV: 87.9 fL (ref 80.0–100.0)
Platelets: 194 10*3/uL (ref 150–400)
RBC: 4.72 MIL/uL (ref 3.87–5.11)
RDW: 16.9 % — ABNORMAL HIGH (ref 11.5–15.5)
WBC: 7.2 10*3/uL (ref 4.0–10.5)
nRBC: 0 % (ref 0.0–0.2)

## 2021-12-22 MED ORDER — AMLODIPINE BESYLATE 10 MG PO TABS
10.0000 mg | ORAL_TABLET | Freq: Every day | ORAL | Status: DC
  Administered 2021-12-22 – 2021-12-23 (×2): 10 mg via ORAL
  Filled 2021-12-22 (×2): qty 1

## 2021-12-22 MED ORDER — POTASSIUM CHLORIDE 10 MEQ/100ML IV SOLN
10.0000 meq | INTRAVENOUS | Status: AC
  Administered 2021-12-22 (×4): 10 meq via INTRAVENOUS
  Filled 2021-12-22 (×4): qty 100

## 2021-12-22 NOTE — Progress Notes (Signed)
PROGRESS NOTE    Taylor Johnson  EUM:[id number] DOB: [birth year] DOA: 12/16/2021 PCP: [name], MD    Brief Narrative:  76 year old female with past medical history of GERD, HTN, chronic abdominal pain, chronic Reglan use, OSA on CPAP presents to the hospital with complaints of recurrent abdominal pain in epigastric region for last 2 weeks. HIDA scan was performed which is negative.  General surgery was consulted also recommended no intervention. GI consulted.  Started on gabapentin.  Due to drowsiness currently gabapentin is discontinued. GI has switch from IV Reglan to IV erythromycin and GI recommended to start the patient on IV antibiotics for possible intra-abdominal infection.  No imaging recommended. Psychiatric consult on 6/6 to assist with possible somatization.  6/7 still with abd pain. Tolerating liquid diet. No v/n  Consultants:  GI, psych  Procedures:   Antimicrobials:      Subjective: C/o abd pain  Objective: Vitals:   12/22/21 0034 12/22/21 0419 12/22/21 0757 12/22/21 1336  BP: (!) 157/72 (!) 141/79 (!) 157/76 (!) 162/79  Pulse: 75 79 85 82  Resp:  '14 16 16  '$ Temp:  98.2 F (36.8 C) 98.2 F (36.8 C) 98.4 F (36.9 C)  TempSrc:      SpO2:  98% 98% 100%  Weight:        Intake/Output Summary (Last 24 hours) at 12/22/2021 1454 Last data filed at 12/22/2021 1000 Gross per 24 hour  Intake 709.14 ml  Output 200 ml  Net 509.14 ml   Filed Weights   12/20/21 0558  Weight: 108.4 kg    Examination: Calm, NAD Cta no w/r Reg s1/s2 no gallop Soft benign +bs No edema Awake and alert Mood and affect appropriate in current setting     Data Reviewed: I have personally reviewed following labs and imaging studies  CBC: Recent Labs  Lab 12/17/21 0356 12/19/21 0435 12/20/21 0618 12/21/21 0551 12/22/21 0442  WBC 5.6 5.7 6.6 6.5 7.2  HGB 11.2* 11.7* 12.0 12.8 12.8  HCT 37.6 39.3 39.3 41.8 41.5  MCV 91.0 91.4 88.5 88.9 87.9  PLT 185 199 187 188 540    Basic Metabolic Panel: Recent Labs  Lab 12/18/21 0904 12/19/21 0435 12/20/21 0618 12/21/21 0551 12/22/21 0442  NA 142 141 143 137 135  K 3.8 4.1 3.7 3.1* 3.2*  CL 105 107 107 93* 91*  CO2 '31 27 29 '$ 35* 36*  GLUCOSE 107* 150* 122* 120* 111*  BUN '20 20 23 14 17  '$ CREATININE 0.87 1.12* 0.90 0.60 0.99  CALCIUM 9.0 9.0 9.2 9.2 9.0  MG 2.0 2.1 2.2 1.9 1.8   GFR: Estimated Creatinine Clearance: 60.1 mL/min (by C-G formula based on SCr of 0.99 mg/dL). Liver Function Tests: Recent Labs  Lab 12/16/21 2126 12/17/21 0356  AST 14* 12*  ALT 12 9  ALKPHOS 85 74  BILITOT 0.7 0.4  PROT 7.8 6.7  ALBUMIN 3.8 3.2*   Recent Labs  Lab 12/16/21 2126  LIPASE 18   No results for input(s): AMMONIA in the last 168 hours. Coagulation Profile: No results for input(s): INR, PROTIME in the last 168 hours. Cardiac Enzymes: No results for input(s): CKTOTAL, CKMB, CKMBINDEX, TROPONINI in the last 168 hours. BNP (last 3 results) No results for input(s): PROBNP in the last 8760 hours. HbA1C: No results for input(s): HGBA1C in the last 72 hours. CBG: No results for input(s): GLUCAP in the last 168 hours. Lipid Profile: No results for input(s): CHOL, HDL, LDLCALC, TRIG, CHOLHDL, LDLDIRECT in the last  72 hours. Thyroid Function Tests: No results for input(s): TSH, T4TOTAL, FREET4, T3FREE, THYROIDAB in the last 72 hours. Anemia Panel: No results for input(s): VITAMINB12, FOLATE, FERRITIN, TIBC, IRON, RETICCTPCT in the last 72 hours. Sepsis Labs: No results for input(s): PROCALCITON, LATICACIDVEN in the last 168 hours.  No results found for this or any previous visit (from the past 240 hour(s)).       Radiology Studies: No results found.      Scheduled Meds:  amLODipine  10 mg Oral Daily   chlorhexidine  15 mL Mouth Rinse BID   enoxaparin (LOVENOX) injection  0.5 mg/kg Subcutaneous Q24H   feeding supplement  1 Container Oral TID BM   mouth rinse  15 mL Mouth Rinse q12n4p    nitroGLYCERIN  1 inch Topical Q6H   OLANZapine  2.5 mg Oral QHS   pantoprazole (PROTONIX) IV  40 mg Intravenous Q24H   senna-docusate  1 tablet Oral BID   Continuous Infusions:  sodium chloride 10 mL/hr at 12/22/21 9379   cefTRIAXone (ROCEPHIN)  IV 1 g (12/22/21 1138)   erythromycin 100 mL/hr at 12/22/21 0240   metronidazole 500 mg (12/22/21 1137)    Assessment & Plan:   Principal Problem:   Abdominal pain, chronic, generalized Active Problems:   Hypertensive urgency   OSA on CPAP   Developmental delay disorder   Benign essential HTN   GERD (gastroesophageal reflux disease)   Morbid obesity ([facility])   Pancreatic insufficiency   Abdominal pain, chronic, generalized Possible intra-abdominal infection/diverticulitis. Gastroparesis. Possible somatization Presents to the hospital with complaints of 2 weeks of abdominal pain. Initially suspected to be having biliary colic. Although LFTs were normal.  HIDA scan normal. Thus patient does not appear to have any biliary colic. General surgery Dr. Lysle Pearl recommended no intervention. X-ray abdomen negative for any acute abnormality. Ultrasound abdomen negative for CBD stone. GI consulted.   6/7 per gi, continue IV erythromycin 500 mg 3 times daily for possible flareup of gastroparesis for another day and switch to oral erythromycin as outpatient for 1 week Continue Protonix 40 mg twice daily stopped Bentyl and Creon Continue antinausea Continue MiraLAX Okay to stop ceftriaxone and metronidazole Do not recommend any cross sectional imaging at this time Advance diet to clear liquid today advance to small frequent meals only.  Avoid red meat, processed food as outpatient Appreciate psychiatry evaluation- continue Zyprexa.  Should see mental health provider at outpatient Continue Zyprexa at bedtime  GI signed off   Hypokalemia We will replace with 40 mEq IV Monitor   Hypertensive urgency BP elevated Increase amlodipine to 10 mg  daily    Pancreatic insufficiency Plan was discontinued   Morbid obesity ([place]) Body mass index is 39.77 kg/m.  Placing the pt at higher risk of poor outcomes.    GERD (gastroesophageal reflux disease) Continue PPI.  Patient was treated with IV PPI.   OSA on CPAP Unable to continue CPAP as patient's has ongoing complaint of frequent nausea and tendency to spit/dry heaves. Continue oxygen.   DVT prophylaxis: Lovenox Code Status: Full Family Communication: Sisters  at bedside Disposition Plan:  Status is: Inpatient Remains inpatient appropriate because: IV treatment still with abdominal pain        LOS: 6 days   Time spent: 35 minutes    Nolberto Hanlon, MD [facility] Pager [phone]  If 7PM-7AM, please contact night-coverage 12/22/2021, 2:54 PM

## 2021-12-22 NOTE — Progress Notes (Signed)
[name], MD [address]  [place]  [name], [name] [address]  Main: [phone]  Fax: [phone] Pager: [phone]   Subjective: Patient had a liquid brown bowel movement today.  No acute events overnight, denies any nausea or vomiting.  Patient still reports that her belly hurts.  On full liquid diet today.  Patient's 2 sisters are bedside.  They tell me that it is hard to keep her away from food.  Patient is at home, not engaged in any other activity and she likes to eat all the time and she has access to different snacks and sugary drinks  Objective: Vital signs in last 24 hours: Vitals:   12/21/21 2012 12/22/21 0034 12/22/21 0419 12/22/21 0757  BP: (!) 148/95 (!) 157/72 (!) 141/79 (!) 157/76  Pulse: 83 75 79 85  Resp: '14  14 16  '$ Temp: 98 F (36.7 C)  98.2 F (36.8 C) 98.2 F (36.8 C)  TempSrc:      SpO2: 99%  98% 98%  Weight:       Weight change:   Intake/Output Summary (Last 24 hours) at 12/22/2021 1308 Last data filed at 12/22/2021 1000 Gross per 24 hour  Intake 709.14 ml  Output 200 ml  Net 509.14 ml    Exam: Heart:: Regular rate and rhythm, S1S2 present, or without murmur or extra heart sounds Lungs: normal and clear to auscultation Abdomen: soft, nontender, normal bowel sounds   Lab Results:    Latest Ref Rng & Units 12/22/2021    4:42 AM 12/21/2021    5:51 AM 12/20/2021    6:18 AM  CBC  WBC 4.0 - 10.5 K/uL 7.2   6.5   6.6    Hemoglobin 12.0 - 15.0 g/dL 12.8   12.8   12.0    Hematocrit 36.0 - 46.0 % 41.5   41.8   39.3    Platelets 150 - 400 K/uL 194   188   187        Latest Ref Rng & Units 12/22/2021    4:42 AM 12/21/2021    5:51 AM 12/20/2021    6:18 AM  CMP  Glucose 70 - 99 mg/dL 111   120   122    BUN 8 - 23 mg/dL '17   14   23    '$ Creatinine 0.44 - 1.00 mg/dL 0.99   0.60   0.90    Sodium 135 - 145 mmol/L 135   137   143    Potassium 3.5 - 5.1 mmol/L 3.2   3.1   3.7    Chloride 98 - 111 mmol/L 91   93   107    CO2 22 - 32 mmol/L  36   35   29    Calcium 8.9 - 10.3 mg/dL 9.0   9.2   9.2      Micro Results: No results found for this or any previous visit (from the past 240 hour(s)). Studies/Results: No results found. Medications: I have reviewed the patient's current medications. Prior to Admission:  Medications Prior to Admission  Medication Sig Dispense Refill Last Dose   dicyclomine (BENTYL) 20 MG tablet Take 1 tablet (20 mg total) by mouth every 8 (eight) hours as needed for spasms (Abdominal cramping). 15 tablet 0 prn at prn   hydrALAZINE (APRESOLINE) 25 MG tablet TAKE ONE TABLET BY MOUTH THREE TIMES DAILY 90 tablet 9 12/16/2021   lipase/protease/amylase (CREON) 36000 UNITS CPEP capsule Take 2 capsules with  the first bite of each meal and 1 capsule with the first bite of each snack 240 capsule 2 12/17/2021   meclizine (ANTIVERT) 12.5 MG tablet TAKE ONE TABLET BY MOUTH THREE TIMES DAILY AS NEEDED FOR DIZZINESS 30 tablet 2 prn at prn   omeprazole (PRILOSEC) 40 MG capsule Take 1 capsule (40 mg total) by mouth 2 (two) times daily before a meal for 30 days, THEN 1 capsule (40 mg total) daily. 150 capsule 0 12/16/2021   ondansetron (ZOFRAN-ODT) 4 MG disintegrating tablet Take 1 tablet (4 mg total) by mouth every 8 (eight) hours as needed for nausea or vomiting. 20 tablet 0 prn at prn   Saccharomyces boulardii (PROBIOTIC) 250 MG CAPS Take 1 capsule by mouth in the morning and at bedtime. 30 capsule 0 12/16/2021   traMADol (ULTRAM) 50 MG tablet Take 50 mg by mouth 3 (three) times daily as needed.   prn at prn   metoCLOPramide (REGLAN) 5 MG tablet TAKE ONE TABLET BY MOUTH EVERY MORNING, NOON, EVENING (Patient not taking: Reported on 12/16/2021) 90 tablet 2 Not Taking   Scheduled:  amLODipine  10 mg Oral Daily   chlorhexidine  15 mL Mouth Rinse BID   enoxaparin (LOVENOX) injection  0.5 mg/kg Subcutaneous Q24H   feeding supplement  1 Container Oral TID BM   mouth rinse  15 mL Mouth Rinse q12n4p   nitroGLYCERIN  1 inch Topical Q6H    OLANZapine  2.5 mg Oral QHS   pantoprazole (PROTONIX) IV  40 mg Intravenous Q24H   senna-docusate  1 tablet Oral BID   Continuous:  sodium chloride 10 mL/hr at 12/22/21 0624   cefTRIAXone (ROCEPHIN)  IV 1 g (12/22/21 1138)   erythromycin 100 mL/hr at 12/22/21 0624   metronidazole 500 mg (12/22/21 1137)   BOF:BPZWCH chloride, acetaminophen **OR** acetaminophen, bisacodyl, guaiFENesin-dextromethorphan, hydrALAZINE, morphine injection, ondansetron **OR** ondansetron (ZOFRAN) IV Anti-infectives (From admission, onward)    Start     Dose/Rate Route Frequency Ordered Stop   12/20/21 1200  erythromycin 500 mg in sodium chloride 0.9 % 100 mL IVPB        500 mg 100 mL/hr over 60 Minutes Intravenous Every 8 hours 12/20/21 1033     12/20/21 1200  metroNIDAZOLE (FLAGYL) IVPB 500 mg        500 mg 100 mL/hr over 60 Minutes Intravenous Every 12 hours 12/20/21 1109     12/20/21 1145  cefTRIAXone (ROCEPHIN) 1 g in sodium chloride 0.9 % 100 mL IVPB        1 g 200 mL/hr over 30 Minutes Intravenous Every 24 hours 12/20/21 1046     12/20/21 1145  metroNIDAZOLE (FLAGYL) tablet 500 mg  Status:  Discontinued        500 mg Oral Every 12 hours 12/20/21 1046 12/20/21 1109      Scheduled Meds:  amLODipine  10 mg Oral Daily   chlorhexidine  15 mL Mouth Rinse BID   enoxaparin (LOVENOX) injection  0.5 mg/kg Subcutaneous Q24H   feeding supplement  1 Container Oral TID BM   mouth rinse  15 mL Mouth Rinse q12n4p   nitroGLYCERIN  1 inch Topical Q6H   OLANZapine  2.5 mg Oral QHS   pantoprazole (PROTONIX) IV  40 mg Intravenous Q24H   senna-docusate  1 tablet Oral BID   Continuous Infusions:  sodium chloride 10 mL/hr at 12/22/21 0624   cefTRIAXone (ROCEPHIN)  IV 1 g (12/22/21 1138)   erythromycin 100 mL/hr at 12/22/21 0624   metronidazole  500 mg (12/22/21 1137)   PRN Meds:.sodium chloride, acetaminophen **OR** acetaminophen, bisacodyl, guaiFENesin-dextromethorphan, hydrALAZINE, morphine injection,  ondansetron **OR** ondansetron (ZOFRAN) IV   Assessment: Principal Problem:   Abdominal pain, chronic, generalized Active Problems:   OSA on CPAP   Developmental delay disorder   Benign essential HTN   GERD (gastroesophageal reflux disease)   Morbid obesity (HCC)   Hypertensive urgency   Pancreatic insufficiency  Taylor Johnson is a 76 y.o. 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  Patient is found to have 1.6 cm stone in the proximal gallbladder.  LFTs have been normal, serum lipase normal.  HIDA scan normal.   Differentials include functional dyspepsia or flareup of gastroparesis or viral gastritis or symptomatic cholelithiasis or functional abdominal pain.  Patient's pain is periumbilical and other possibility is mild acute diverticulitis given her history of pancolonic diverticulosis Patient is evaluated by general surgery, did not recommend cholecystectomy because of atypical symptoms.  Patient already had extensive work-up till date and nothing has panned out.  She already had multiple imaging studies and other extensive work-up and has been unrevealing.  Therefore, her symptoms are functional at this point.  There is a component of eating disorder as well contributing to her GI symptoms  Plan: Patient is currently being treated for all of the above differentials  Highly recommend small portion diet only and avoid frequent snacking and processed sugars, meats Okay to continue IV erythromycin 500 mg 3 times daily for possible flareup of gastroparesis for another day and switch to oral erythromycin as outpatient for 1 week Continue Protonix to 40 mg p.o. twice daily Stopped Bentyl,  Creon Continue Zofran or Phenergan as needed for nausea Continue MiraLAX Okay to stop ceftriaxone and metronidazole Do not recommend any cross-sectional imaging at this time given patient does not have fever or leukocytosis or abdominal tenderness Advance to clear liquid diet today, advance to small frequent meals only.  Avoid red meat, processed foods as outpatient Appreciate psychiatry evaluation Continue Zyprexa at bedtime No further recommendations from GI standpoint at this time.  GI will sign off, please call us back with questions or concerns  Discussed my recommendations with patient's sisters who were bedside   LOS: 6 days   Taylor Johnson 12/22/2021, 1:08 PM

## 2021-12-22 NOTE — Consult Note (Signed)
Taylor Johnson Psychiatry Consult   Reason for Consult:  poor po intake/mood Referring Physician:  Posey Pronto Patient Identification: CRESCENT GOTHAM MRN:  [id number] Principal Diagnosis: Abdominal pain, chronic, generalized Diagnosis:  Principal Problem:   Abdominal pain, chronic, generalized Active Problems:   OSA on CPAP   Developmental delay disorder   Benign essential HTN   GERD (gastroesophageal reflux disease)   Morbid obesity ([place])   Hypertensive urgency   Pancreatic insufficiency   Total Time spent with patient: 45 minutes  Subjective:   Taylor Johnson is a 76 y.o. female patient admitted with abdominal pain/poor po intake.  HPI:  Patient admitted to medical floor for hx of abdominal pain x 5 weeks (according to patient and sisters), accompanied by some vomiting. Psych consult ordered for evaluation of possible psychiatric component.  Upon evaluation, patient is sitting up in chair at bedside.  Patient's 2 sisters are also at bedside with patient, will provide some collateral information.  Patient is alert, neat.  She is alert and oriented x3.  She looks visibly uncomfortable, stating that she is nauseous and her stomach hurts.  She answers with minimal talking, sisters offer verification of information. She states that she has been "Lexicographer advised patient reason for visit, along with acknowledging that physical symptoms can often cause depression when they have gone on for some time.  Patient does look sad although she states that her mood is "okay."  Sisters provided information that and their mother lived together, until mother's death in 2021/02/28.  Patient began to cry when she mentions her mother and states that she misses her so much.  Patient denies depression to the point of not wanting to be alive.  She admits to being lonely at times, other times she is "fine." Her niece lives with her but isn't home much.  Patient says that most of her family is very busy and not  able to come around much.  She denies suicidal thoughts or ever having suicidal thoughts.  Sister states that she did see a mental health therapist about 3 or 4 years ago for "outbursts of anger."  Patient agrees.  They all agreed that her doctor may have prescribed something for her at that time but she did not take it for long if at all.  Patient denies history of psychiatric hospitalizations.  Patient does not describe some visual hallucinations of "people in her bedroom" over the last few years people she states that she has not been sleeping in her bedroom since then, which sisters confirm.  Patient states that she will sometimes "hear the doorbell ring" and no one is there.  Sisters confirm this, but states that this is not been a problem in her life and she does not hear or seeing things throughout the day.  It may have occurred more since her mother passed away. Sisters confirm that patient and mother were "joined at the hip" and patient had a very difficult adjustment with mother's passing.   Patient has not had any vomiting today according to her and her sisters.  She does endorse feeling nauseous.  No "spitting" or vomiting observed during this evaluation.     Past Psychiatric History: Per patient's and sisters report, patient saw a mental health provider   Risk to Self:   Risk to Others:   Prior Inpatient Therapy:   Prior Outpatient Therapy:    Past Medical History:  Past Medical History:  Diagnosis Date   Abdominal pain 01/27/2017  Anxiety    Anxiety, generalized 05/11/2015   Cough 06/02/2016   Chronic - followed by Pulmonary   Cough 06/02/2016   Chronic - followed by Pulmonary   Depression    Developmental delay    Diverticulitis    Diverticulosis of colon 05/11/2015   Dysphagia 10/15/2015   Routine Ba Swallow normal; rec modified barium swallow    GERD (gastroesophageal reflux disease)    Hematochezia 11/07/2019   Hypertension    Hypoxia 09/12/2015   Leg swelling     Localized edema 04/15/2016   Microscopic hematuria 04/15/2016   Nausea and vomiting 01/29/2017   Orthostatic hypotension 03/16/2017   OSA on CPAP 11/18/2015   CPAP @ 18 cm H2O started 02/2016   Rectal bleeding 10/15/2018   Urge incontinence of urine 05/12/2015    Past Surgical History:  Procedure Laterality Date   ABDOMINAL HYSTERECTOMY     BREAST BIOPSY Left    neg   COLON SURGERY     COLONOSCOPY WITH PROPOFOL N/A 12/13/2019   Procedure: COLONOSCOPY WITH PROPOFOL;  Surgeon: Lin Landsman, MD;  Location: [facility] ENDOSCOPY;  Service: Endoscopy;  Laterality: N/A;   ESOPHAGOGASTRODUODENOSCOPY (EGD) WITH PROPOFOL N/A 01/29/2017   Procedure: ESOPHAGOGASTRODUODENOSCOPY (EGD) WITH PROPOFOL;  Surgeon: Wilford Corner, MD;  Location: [facility] ENDOSCOPY;  Service: Endoscopy;  Laterality: N/A;   ESOPHAGOGASTRODUODENOSCOPY (EGD) WITH PROPOFOL N/A 08/02/2021   Procedure: ESOPHAGOGASTRODUODENOSCOPY (EGD) WITH PROPOFOL;  Surgeon: Lin Landsman, MD;  Location: [facility] ENDOSCOPY;  Service: Gastroenterology;  Laterality: N/A;   Family History:  Family History  Problem Relation Age of Onset   Hypertension Mother    Kidney disease Mother    Dementia Mother    Cancer Father        lung   Bladder Cancer Neg Hx    Breast cancer Neg Hx    Family Psychiatric  History: anxiety in sister Social History:  Social History   Substance and Sexual Activity  Alcohol Use No   Alcohol/week: 0.0 standard drinks     Social History   Substance and Sexual Activity  Drug Use No    Social History   Socioeconomic History   Marital status: Single    Spouse name: Not on file   Number of children: 0   Years of education: Not on file   Highest education level: 5th grade  Occupational History    Employer: retired  Tobacco Use   Smoking status: Never   Smokeless tobacco: Never   Tobacco comments:    smoking cessation materials not required  Vaping Use   Vaping Use: Never used  Substance and Sexual  Activity   Alcohol use: No    Alcohol/week: 0.0 standard drinks   Drug use: No   Sexual activity: Never    Birth control/protection: Post-menopausal  Other Topics Concern   Not on file  Social History Narrative   Pt lives with her niece Alvie Heidelberg Messineo   Social Determinants of Health   Financial Resource Strain: Low Risk    Difficulty of Paying Living Expenses: Not very hard  Food Insecurity: No Food Insecurity   Worried About Charity fundraiser in the Last Year: Never true   Ran Out of Food in the Last Year: Never true  Transportation Needs: No Transportation Needs   Lack of Transportation (Medical): No   Lack of Transportation (Non-Medical): No  Physical Activity: Inactive   Days of Exercise per Week: 0 days   Minutes of Exercise per Session: 0 min  Stress: No  Stress Concern Present   Feeling of Stress : Only a little  Social Connections: Socially Isolated   Frequency of Communication with Friends and Family: More than three times a week   Frequency of Social Gatherings with Friends and Family: More than three times a week   Attends Religious Services: Never   Marine scientist or Organizations: No   Attends Archivist Meetings: Never   Marital Status: Never married   Additional Social History:    Allergies:  No Known Allergies  Labs:  Results for orders placed or performed during the hospital encounter of 12/16/21 (from the past 48 hour(s))  CBC     Status: Abnormal   Collection Time: 12/21/21  5:51 AM  Result Value Ref Range   WBC 6.5 4.0 - 10.5 K/uL   RBC 4.70 3.87 - 5.11 MIL/uL   Hemoglobin 12.8 12.0 - 15.0 g/dL   HCT 41.8 36.0 - 46.0 %   MCV 88.9 80.0 - 100.0 fL   MCH 27.2 26.0 - 34.0 pg   MCHC 30.6 30.0 - 36.0 g/dL   RDW 17.2 (H) 11.5 - 15.5 %   Platelets 188 150 - 400 K/uL   nRBC 0.0 0.0 - 0.2 %    Comment: Performed at [facility], [address], [place], [place] [address]  Magnesium     Status: None   Collection Time:  12/21/21  5:51 AM  Result Value Ref Range   Magnesium 1.9 1.7 - 2.4 mg/dL    Comment: Performed at [facility], [address], [place], [place] [address]  Basic metabolic panel     Status: Abnormal   Collection Time: 12/21/21  5:51 AM  Result Value Ref Range   Sodium 137 135 - 145 mmol/L   Potassium 3.1 (L) 3.5 - 5.1 mmol/L   Chloride 93 (L) 98 - 111 mmol/L   CO2 35 (H) 22 - 32 mmol/L   Glucose, Bld 120 (H) 70 - 99 mg/dL    Comment: Glucose reference range applies only to samples taken after fasting for at least 8 hours.   BUN 14 8 - 23 mg/dL   Creatinine, Ser 0.60 0.44 - 1.00 mg/dL   Calcium 9.2 8.9 - 10.3 mg/dL   GFR, Estimated >60 >60 mL/min    Comment: (NOTE) Calculated using the CKD-EPI Creatinine Equation (2021)    Anion gap 9 5 - 15    Comment: Performed at [facility], [address] [place], [place] [address]  Basic metabolic panel     Status: Abnormal   Collection Time: 12/22/21  4:42 AM  Result Value Ref Range   Sodium 135 135 - 145 mmol/L   Potassium 3.2 (L) 3.5 - 5.1 mmol/L   Chloride 91 (L) 98 - 111 mmol/L   CO2 36 (H) 22 - 32 mmol/L   Glucose, Bld 111 (H) 70 - 99 mg/dL    Comment: Glucose reference range applies only to samples taken after fasting for at least 8 hours.   BUN 17 8 - 23 mg/dL   Creatinine, Ser 0.99 0.44 - 1.00 mg/dL   Calcium 9.0 8.9 - 10.3 mg/dL   GFR, Estimated 59 (L) >60 mL/min    Comment: (NOTE) Calculated using the CKD-EPI Creatinine Equation (2021)    Anion gap 8 5 - 15    Comment: Performed at [facility], [address], [place], [place] [address]  CBC     Status: Abnormal   Collection Time: 12/22/21  4:42 AM  Result Value Ref Range   WBC 7.2 4.0 - 10.5 K/uL   RBC 4.72 3.87 - 5.11 MIL/uL   Hemoglobin 12.8 12.0 - 15.0 g/dL   HCT 41.5 36.0 - 46.0 %   MCV 87.9 80.0 - 100.0 fL   MCH 27.1 26.0 - 34.0 pg   MCHC 30.8 30.0 - 36.0 g/dL   RDW 16.9 (H) 11.5 - 15.5 %   Platelets 194 150 - 400 K/uL   nRBC  0.0 0.0 - 0.2 %    Comment: Performed at [facility], [address] [place], [place] [address]  Magnesium     Status: None   Collection Time: 12/22/21  4:42 AM  Result Value Ref Range   Magnesium 1.8 1.7 - 2.4 mg/dL    Comment: Performed at [facility], [address], [place], [place] [address]    Current Facility-Administered Medications  Medication Dose Route Frequency Provider Last Rate Last Admin   0.9 %  sodium chloride infusion   Intravenous PRN Lavina Hamman, MD 10 mL/hr at 12/22/21 0624 Infusion Verify at 12/22/21 0109   acetaminophen (TYLENOL) tablet 650 mg  650 mg Oral Q6H PRN Mansy, Jan A, MD       Or   acetaminophen (TYLENOL) suppository 650 mg  650 mg Rectal Q6H PRN Mansy, Jan A, MD       amLODipine (NORVASC) tablet 10 mg  10 mg Oral Daily Nolberto Hanlon, MD   10 mg at 12/22/21 0916   bisacodyl (DULCOLAX) suppository 10 mg  10 mg Rectal Daily PRN Lavina Hamman, MD       cefTRIAXone (ROCEPHIN) 1 g in sodium chloride 0.9 % 100 mL IVPB  1 g Intravenous Q24H Lavina Hamman, MD   Stopped at 12/21/21 1306   chlorhexidine (PERIDEX) 0.12 % solution 15 mL  15 mL Mouth Rinse BID Lavina Hamman, MD   15 mL at 12/22/21 0915   enoxaparin (LOVENOX) injection 52.5 mg  0.5 mg/kg Subcutaneous Q24H Mansy, Jan A, MD   52.5 mg at 12/22/21 0915   erythromycin 500 mg in sodium chloride 0.9 % 100 mL IVPB  500 mg Intravenous Q8H Lin Landsman, MD 100 mL/hr at 12/22/21 0624 Infusion Verify at 12/22/21 0624   feeding supplement (BOOST / RESOURCE BREEZE) liquid 1 Container  1 Container Oral TID BM Lavina Hamman, MD   1 Container at 12/22/21 0941   guaiFENesin-dextromethorphan (ROBITUSSIN DM) 100-10 MG/5ML syrup 5 mL  5 mL Oral Q4H PRN Lavina Hamman, MD   5 mL at 12/19/21 2124   hydrALAZINE (APRESOLINE) injection 10 mg  10 mg Intravenous Q4H PRN Lavina Hamman, MD       MEDLINE mouth rinse  15 mL Mouth Rinse q12n4p Lavina Hamman, MD   15 mL at 12/21/21 1231    metroNIDAZOLE (FLAGYL) IVPB 500 mg  500 mg Intravenous Q12H Lavina Hamman, MD   Stopped at 12/22/21 0135   morphine (PF) 2 MG/ML injection 2 mg  2 mg Intravenous Q3H PRN Lavina Hamman, MD   2 mg at 12/20/21 1131   nitroGLYCERIN (NITROGLYN) 2 % ointment 1 inch  1 inch Topical Q6H Lavina Hamman, MD   1 inch at 12/22/21 0527   OLANZapine (ZYPREXA) tablet 2.5 mg  2.5 mg Oral QHS Lavina Hamman, MD   2.5 mg at 12/21/21 2220   ondansetron (ZOFRAN) tablet 4 mg  4 mg Oral Q6H PRN Mansy, Jan  A, MD       Or   ondansetron (ZOFRAN) injection 4 mg  4 mg Intravenous Q6H PRN Mansy, Jan A, MD   4 mg at 12/20/21 1126   pantoprazole (PROTONIX) injection 40 mg  40 mg Intravenous Q24H Lavina Hamman, MD   40 mg at 12/21/21 1231   senna-docusate (Senokot-S) tablet 1 tablet  1 tablet Oral BID Lavina Hamman, MD   1 tablet at 12/22/21 2706    Musculoskeletal: Strength & Muscle Tone:  did not assess Gait & Station:  did not assess Patient leans: N/A   Psychiatric Specialty Exam:  Presentation  General Appearance: Appropriate for Environment; Neat Eye Contact:Good Speech:Clear and Coherent Speech Volume:Normal Handedness:No data recorded  Mood and Affect  Mood:-- (stated "OK") Affect:Blunt  Thought Process  Thought Processes:Coherent Descriptions of Associations:Intact  Orientation:Full (Time, Place and Person)  Thought Content:Logical  History of Schizophrenia/Schizoaffective disorder:No data recorded Duration of Psychotic Symptoms:No data recorded Hallucinations:Hallucinations: None (none currently; hx of AVH within past year)  Ideas of Reference:None  Suicidal Thoughts:Suicidal Thoughts: No  Homicidal Thoughts:Homicidal Thoughts: No   Sensorium  Memory:Immediate Good; Recent Good Judgment:Good Insight:Fair  Executive Functions  Concentration:Fair Attention Span:Fair [place] of Knowledge:Good Language:Good  Psychomotor Activity  Psychomotor  Activity:Psychomotor Activity: Normal  Assets  Assets:Desire for Improvement; Resilience  Sleep  Sleep:Sleep: Poor  Physical Exam: Physical Exam Vitals and nursing note reviewed.  HENT:     Head: Normocephalic.     Nose: No congestion or rhinorrhea.  Eyes:     General:        Right eye: No discharge.        Left eye: No discharge.  Cardiovascular:     Rate and Rhythm: Normal rate.  Pulmonary:     Effort: Pulmonary effort is normal.     Comments: On continuous O2 via nasal cannula Musculoskeletal:     Cervical back: Normal range of motion.  Skin:    General: Skin is dry.  Neurological:     Mental Status: She is alert and oriented to person, place, and time.  Psychiatric:        Attention and Perception: Attention normal.        Mood and Affect: Affect is blunt.        Behavior: Behavior normal.        Thought Content: Thought content normal. Thought content is not paranoid. Thought content does not include homicidal or suicidal ideation.        Cognition and Memory: Cognition normal.        Judgment: Judgment normal.   Review of Systems  Psychiatric/Behavioral:  Positive for depression and hallucinations (not currently). Negative for memory loss, substance abuse and suicidal ideas. The patient is not nervous/anxious and does not have insomnia.   All other systems reviewed and are negative. Blood pressure (!) 157/76, pulse 85, temperature 98.2 F (36.8 C), resp. rate 16, weight 108.4 kg, SpO2 98 %. Body mass index is 39.77 kg/m.  Treatment Plan Summary:Patient was prescribed Zyprexa 2.5 mg at night by attending;   she took it last night. Would recommend continuing Zyprexa for possible hallucinations, as well as for her nausea and vomiting. If she is able to tolerate soft diet today, may consider adding antidepressant; however, she and her sisters are very hesitant to add more medication at this time.   Recommend that patient see mental health provider at discharge.     Disposition: No evidence of imminent risk to self or  others at present.   Supportive therapy provided about ongoing stressors.  Sherlon Handing, NP 12/22/2021 10:47 AM

## 2021-12-22 NOTE — Progress Notes (Signed)
Occupational Therapy Treatment Patient Details Name: Taylor Johnson MRN: [id number] DOB: 04-11-1946 Today's Date: 12/22/2021   History of present illness 76 yo female with medical history significant for GERD, hypertension, and anxiety, obstructive sleep apnea on home CPAP, who presented to the ER with acute onset of epigastric and to lesser extent right upper quadrant abdominal pain for the last week with associated intermittent nausea and vomiting worsening with food intake.  Patient had an unremarkable EGD in January and has been following with Dr. Lysle Pearl for cholelithiasis on Tuesday when he recommended HIDA scan.  She denies any fever or chills.  No diarrhea or constipation.  No melena protruding per rectum.  She denied any bilious vomitus or hematemesis.  No chest pain dyspnea or cough.   OT comments  Upon entering the room, pt supine in bed with with sisters present in the room. Family standing and exiting for pt to participate in OT intervention. Pt is more talkative when asked questions during this session vs yesterday at evaluation. Pt needing increased cuing and over 10 minutes to move to EOB with supervision. Pt stops multiple times and begins crying about her mother passing last year and another family friend. OT attempting to redirect pt to task. Once seated on EOB, pt refusing to stand and becomes very resistive to movement and lays herself back down. Pt is unable to verbalized why she does not want to participate further other than stating, " I don't feel good". RN notified. All needs within reach and bed alarm activated.    Recommendations for follow up therapy are one component of a multi-disciplinary discharge planning process, led by the attending physician.  Recommendations may be updated based on patient status, additional functional criteria and insurance authorization.    Follow Up Recommendations  Home health OT    Assistance Recommended at Discharge Intermittent  Supervision/Assistance  Patient can return home with the following  A little help with walking and/or transfers;A little help with bathing/dressing/bathroom;Assistance with cooking/housework;Direct supervision/assist for medications management;Assist for transportation;Help with stairs or ramp for entrance;Direct supervision/assist for financial management   Equipment Recommendations  None recommended by OT       Precautions / Restrictions Precautions Precautions: Fall Restrictions Weight Bearing Restrictions: No       Mobility Bed Mobility Overal bed mobility: Needs Assistance Bed Mobility: Supine to Sit     Supine to sit: Supervision Sit to supine: Supervision        Transfers                   General transfer comment: pt refuses and resistive to standing and pushes self back into bed     Balance Overall balance assessment: Needs assistance Sitting-balance support: Feet supported, Bilateral upper extremity supported Sitting balance-Leahy Scale: Good     Standing balance support: Reliant on assistive device for balance, During functional activity Standing balance-Leahy Scale: Fair                             ADL either performed or assessed with clinical judgement    Extremity/Trunk Assessment Upper Extremity Assessment Upper Extremity Assessment: Generalized weakness   Lower Extremity Assessment Lower Extremity Assessment: Generalized weakness        Vision Patient Visual Report: No change from baseline            Cognition Arousal/Alertness: Awake/alert Behavior During Therapy: Flat affect Overall Cognitive Status: Difficult to assess  General Comments: Pt is very tearful during session.                   Pertinent Vitals/ Pain       Pain Assessment Pain Assessment: Faces Faces Pain Scale: Hurts a little bit Pain Location: generalized Pain Descriptors / Indicators:  Discomfort Pain Intervention(s): Repositioned, Monitored during session         Frequency  Min 2X/week        Progress Toward Goals  OT Goals(current goals can now be found in the care plan section)  Progress towards OT goals: Progressing toward goals  Acute Rehab OT Goals Patient Stated Goal: to go home OT Goal Formulation: With patient Time For Goal Achievement: 01/03/22 Potential to Achieve Goals: Good  Plan Discharge plan remains appropriate;Frequency remains appropriate       AM-PAC OT "6 Clicks" Daily Activity     Outcome Measure   Help from another person eating meals?: None Help from another person taking care of personal grooming?: A Little Help from another person toileting, which includes using toliet, bedpan, or urinal?: A Little Help from another person bathing (including washing, rinsing, drying)?: A Little Help from another person to put on and taking off regular upper body clothing?: None Help from another person to put on and taking off regular lower body clothing?: A Little 6 Click Score: 20    End of Session    OT Visit Diagnosis: Unsteadiness on feet (R26.81);Repeated falls (R29.6);Muscle weakness (generalized) (M62.81)   Activity Tolerance Patient tolerated treatment well   Patient Left in bed;with call bell/phone within reach;with bed alarm set;with family/visitor present   Nurse Communication Mobility status        Time: 5053-9767 OT Time Calculation (min): 21 min  Charges: OT General Charges $OT Visit: 1 Visit OT Treatments $Therapeutic Activity: 8-22 mins  Darleen Crocker, MS, OTR/L , CBIS ascom [phone]  12/22/21, 3:20 PM

## 2021-12-22 NOTE — Progress Notes (Signed)
Physical Therapy Treatment Patient Details Name: Taylor Johnson MRN: [id number] DOB: Dec 09, 1945 Today's Date: 12/22/2021   History of Present Illness 76 yo female with medical history significant for GERD, hypertension, and anxiety, obstructive sleep apnea on home CPAP, who presented to the ER with acute onset of epigastric and to lesser extent right upper quadrant abdominal pain for the last week with associated intermittent nausea and vomiting worsening with food intake.  Patient had an unremarkable EGD in January and has been following with Dr. Lysle Pearl for cholelithiasis on Tuesday when he recommended HIDA scan.  She denies any fever or chills.  No diarrhea or constipation.  No melena protruding per rectum.  She denied any bilious vomitus or hematemesis.  No chest pain dyspnea or cough.    PT Comments    Pt largely with flat affect but with encouragement from sister and PT was willing to ambulate in the hall.  She showed improved tolerance and cadence this date, still requiring the stability of the walker as she did fatigue as she reached the 169f mark.  Pt's sister present and reports that she and her sisters are planning on helping her out when she returns home.     Recommendations for follow up therapy are one component of a multi-disciplinary discharge planning process, led by the attending physician.  Recommendations may be updated based on patient status, additional functional criteria and insurance authorization.  Follow Up Recommendations  Home health PT     Assistance Recommended at Discharge Intermittent Supervision/Assistance  Patient can return home with the following Assistance with cooking/housework;Assist for transportation;Help with stairs or ramp for entrance   Equipment Recommendations  None recommended by PT    Recommendations for Other Services       Precautions / Restrictions Precautions Precautions: Fall Restrictions Weight Bearing Restrictions: No      Mobility  Bed Mobility Overal bed mobility: Modified Independent Bed Mobility: Supine to Sit     Supine to sit: Supervision     General bed mobility comments: Pt easily gets herself to sitting at EOB w/o assist or hesitation    Transfers Overall transfer level: Needs assistance Equipment used: Rolling walker (2 wheels) Transfers: Sit to/from Stand Sit to Stand: Min guard           General transfer comment: repeated cuing for foot placement, UE use and sequencing - able to rise from standard height bed w/o phyiscal assist    Ambulation/Gait Ambulation/Gait assistance: Supervision Gait Distance (Feet): 110 Feet Assistive device: Rolling walker (2 wheels)         General Gait Details: Pt able to ambulate with slow but confident cadence.  Appropriate use of walker (cane baseline.)  She did endorse some fatigue and requested to sit down at ~85 ft, ultimately went another 275f feet and needed to sit.  O2, on room air, drops from 94 to 87% with the effort - HR up to ~100bpm.   Stairs             Wheelchair Mobility    Modified Rankin (Stroke Patients Only)       Balance Overall balance assessment: Needs assistance Sitting-balance support: Feet supported, Bilateral upper extremity supported Sitting balance-Leahy Scale: Good     Standing balance support: Reliant on assistive device for balance, Bilateral upper extremity supported Standing balance-Leahy Scale: Fair  Cognition Arousal/Alertness: Awake/alert Behavior During Therapy: Flat affect Overall Cognitive Status: Difficult to assess                                          Exercises      General Comments        Pertinent Vitals/Pain Pain Assessment Pain Score: 0-No pain    Home Living                          Prior Function            PT Goals (current goals can now be found in the care plan section) Progress  towards PT goals: Progressing toward goals    Frequency    Min 2X/week      PT Plan Current plan remains appropriate    Co-evaluation              AM-PAC PT "6 Clicks" Mobility   Outcome Measure  Help needed turning from your back to your side while in a flat bed without using bedrails?: None Help needed moving from lying on your back to sitting on the side of a flat bed without using bedrails?: None Help needed moving to and from a bed to a chair (including a wheelchair)?: None Help needed standing up from a chair using your arms (e.g., wheelchair or bedside chair)?: None Help needed to walk in hospital room?: A Little Help needed climbing 3-5 steps with a railing? : A Little 6 Click Score: 22    End of Session Equipment Utilized During Treatment: Gait belt Activity Tolerance: Patient tolerated treatment well Patient left: with chair alarm set;with call bell/phone within reach;with nursing/sitter in room Nurse Communication: Mobility status PT Visit Diagnosis: Unsteadiness on feet (R26.81);Muscle weakness (generalized) (M62.81);Difficulty in walking, not elsewhere classified (R26.2)     Time: 7867-6720 PT Time Calculation (min) (ACUTE ONLY): 16 min  Charges:  $Gait Training: 8-22 mins                     Kreg Shropshire, DPT 12/22/2021, 6:09 PM

## 2021-12-22 NOTE — Plan of Care (Signed)

## 2021-12-23 ENCOUNTER — Telehealth: Payer: Self-pay

## 2021-12-23 DIAGNOSIS — R1084 Generalized abdominal pain: Secondary | ICD-10-CM | POA: Diagnosis not present

## 2021-12-23 DIAGNOSIS — G8929 Other chronic pain: Secondary | ICD-10-CM | POA: Diagnosis not present

## 2021-12-23 MED ORDER — ERYTHROMYCIN BASE 500 MG PO TABS: 500.0000 mg | ORAL_TABLET | Freq: Three times a day (TID) | ORAL | 0 refills | Status: DC

## 2021-12-23 MED ORDER — OLANZAPINE 2.5 MG PO TABS: 2.5000 mg | ORAL_TABLET | Freq: Every day | ORAL | 0 refills | Status: DC

## 2021-12-23 MED ORDER — PANTOPRAZOLE SODIUM 40 MG PO TBEC: 40.0000 mg | DELAYED_RELEASE_TABLET | Freq: Two times a day (BID) | ORAL | 1 refills | Status: AC

## 2021-12-23 MED ORDER — SENNOSIDES-DOCUSATE SODIUM 8.6-50 MG PO TABS: 1.0000 | ORAL_TABLET | Freq: Every evening | ORAL | 0 refills | Status: AC | PRN

## 2021-12-23 NOTE — Progress Notes (Signed)
Met with the patient and her sister at the bedside She lives at home with her niece She has DME at home and doe snot need additional Her siblings provide transportation [facility] accepted her for [facility] services

## 2021-12-23 NOTE — Care Management Important Message (Signed)
Important Message  Patient Details  Name: Taylor Johnson MRN: [id number] Date of Birth: 1945-08-16   Medicare Important Message Given:  Yes     Juliann Pulse A Isauro Skelley 12/23/2021, 9:48 AM

## 2021-12-23 NOTE — Telephone Encounter (Signed)
Transition Care Management Follow-up Telephone Call Date of discharge and from where: 12/23/21 [facility] How have you been since you were released from the hospital? Pt doing okay per patient's sister [name] Any questions or concerns? No  Items Reviewed: Did the pt receive and understand the discharge instructions provided? Yes  Medications obtained and verified? Yes  Other? No  Any new allergies since your discharge? No  Dietary orders reviewed? Yes Do you have support at home? Yes   Home Care and Equipment/Supplies: Were home health services ordered? yes If so, what is the name of the agency? [facility]  Has the agency set up a time to come to the patient's home? no Were any new equipment or medical supplies ordered?  No   Functional Questionnaire: (I = Independent and D = Dependent) ADLs: I  Bathing/Dressing- I  Meal Prep- I  Eating- I  Maintaining continence- I  Transferring/Ambulation- I  Managing Meds- D  Follow up appointments reviewed:  PCP Hospital f/u appt confirmed? Yes  Scheduled to see Dr. Army Melia on 12/29/21 @ 1:20. [facility] f/u appt confirmed? Yes  Scheduled to see Dr. Hampton Abbot on 12/29/21 @ 11:00. Are transportation arrangements needed? No  If their condition worsens, is the pt aware to call PCP or go to the Emergency Dept.? Yes Was the patient provided with contact information for the PCP's office or ED? Yes Was to pt encouraged to call back with questions or concerns? Yes

## 2021-12-23 NOTE — Progress Notes (Signed)
Discharge instructions reviewed with pt. She and her sister verbalized understanding of instructions. Pt and sister was instructed to make appts

## 2021-12-23 NOTE — Discharge Summary (Signed)
Taylor Johnson EKC:[id number] DOB: July 07, 1946 DOA: 12/16/2021  PCP: [name], MD  Admit date: 12/16/2021 Discharge date: 12/23/2021  Admitted From: home Disposition:  home  Recommendations for Outpatient Follow-up:  Follow up with PCP in 1 week Please obtain BMP/CBC in one week Please follow up with GI in 2 weeks      Discharge Condition:Stable CODE STATUS: Full Diet recommendation: GI Highly recommend small portion diet only and avoid frequent snacking and processed sugars, meats  Brief/Interim Summary: Per HPI:76 year old female with past medical history of GERD, HTN, chronic abdominal pain, chronic Reglan use, OSA on CPAP presents to the hospital with complaints of recurrent abdominal pain in epigastric region for last 2 weeks. HIDA scan was performed which is negative.  General surgery was consulted also recommended no intervention. GI consulted.  Started on gabapentin.  Due to drowsiness currently gabapentin is discontinued. GI has switch from IV Reglan to IV erythromycin and GI recommended to start the patient on IV antibiotics for possible intra-abdominal infection.  No imaging recommended. Psychiatric consult on 6/6 to assist with possible somatization. She has tolerated her slow advancing of diet. She is stable for discharge.  Abdominal pain, chronic, generalized Possible intra-abdominal infection/diverticulitis. Gastroparesis. Possible somatization Presents to the hospital with complaints of 2 weeks of abdominal pain. Initially suspected to be having biliary colic. Although LFTs were normal.  HIDA scan normal. Thus patient does not appear to have any biliary colic. General surgery Dr. Lysle Pearl recommended no intervention. X-ray abdomen negative for any acute abnormality. Ultrasound abdomen negative for CBD stone. GI consulted.   Started on continue IV erythromycin 500 mg 3 times daily for possible flareup of gastroparesis with transition to po for one week as  outpatient. Continue Protonix 40 mg twice daily  GI stopped Bentyl and Creon Continue MiraLAX Antibiotics was eventually stopped. GI did not recommend any cross sectional imaging at this time Diet advance to small frequent meals only.  Avoid red meat, processed food as outpatient Appreciate psychiatry evaluation- continue Zyprexa.  Should see mental health provider at outpatient      Hypokalemia Was replaced     Hypertensive urgency Adjusted medications F/u with pcp for further evaluation     Pancreatic insufficiency Plan was discontinued as above   Morbid obesity ([place]) Body mass index is 39.77 kg/m.  Placing the pt at higher risk of poor outcomes.    GERD (gastroesophageal reflux disease) Continue PPI.   OSA on CPAP     Discharge Diagnoses:  Principal Problem:   Abdominal pain, chronic, generalized Active Problems:   Hypertensive urgency   OSA on CPAP   Developmental delay disorder   Benign essential HTN   GERD (gastroesophageal reflux disease)   Morbid obesity ([place])   Pancreatic insufficiency    Discharge Instructions  Discharge Instructions     Diet - low sodium heart healthy   Complete by: As directed    Discharge instructions   Complete by: As directed    [employer] [phone].  also try [phone]  advance to small frequent meals only.  Avoid red meat, processed foods as outpatient   Increase activity slowly   Complete by: As directed       Allergies as of 12/23/2021   No Known Allergies      Medication List     STOP taking these medications    dicyclomine 20 MG tablet Commonly known as: BENTYL   lipase/protease/amylase 36000 UNITS Cpep capsule Commonly known as: Creon  omeprazole 40 MG capsule Commonly known as: PRILOSEC   traMADol 50 MG tablet Commonly known as: ULTRAM       TAKE these medications    erythromycin base 500 MG tablet Commonly known as:  E-MYCIN Take 1 tablet (500 mg total) by mouth 3 (three) times daily for 8 days.   hydrALAZINE 25 MG tablet Commonly known as: APRESOLINE TAKE ONE TABLET BY MOUTH THREE TIMES DAILY   meclizine 12.5 MG tablet Commonly known as: ANTIVERT TAKE ONE TABLET BY MOUTH THREE TIMES DAILY AS NEEDED FOR DIZZINESS Notes to patient: Not given in hospital   metoCLOPramide 5 MG tablet Commonly known as: REGLAN TAKE ONE TABLET BY MOUTH EVERY MORNING, NOON, EVENING   OLANZapine 2.5 MG tablet Commonly known as: ZYPREXA Take 1 tablet (2.5 mg total) by mouth at bedtime.   ondansetron 4 MG disintegrating tablet Commonly known as: ZOFRAN-ODT Take 1 tablet (4 mg total) by mouth every 8 (eight) hours as needed for nausea or vomiting.   pantoprazole 40 MG tablet Commonly known as: Protonix Take 1 tablet (40 mg total) by mouth 2 (two) times daily.   Probiotic 250 MG Caps Take 1 capsule by mouth in the morning and at bedtime.   senna-docusate 8.6-50 MG tablet Commonly known as: Senokot-S Take 1 tablet by mouth at bedtime as needed for up to 10 days for mild constipation.        Follow-up Information     [name], MD Follow up in 1 week(s).   Specialty: Internal Medicine Why: Follow up appointment on June 14th at 1:20pm. Please call the office with any questions. Contact information: [address] [place] [address] [phone]         Lin Landsman, MD Follow up in 2 week(s).   Specialty: Gastroenterology Why: Please call the office to schedule an appointment. Contact information: [place] [address] [phone]                No Known Allergies  Consultations: GSX, GI   Procedures/Studies: DG Chest Port 1 View  Result Date: 12/19/2021 CLINICAL DATA:  Shortness of breath EXAM: PORTABLE CHEST 1 VIEW COMPARISON:  December 16, 2021 FINDINGS: Stable cardiomegaly. The hila and mediastinum are unchanged. No pneumothorax. No  nodules or masses. Mild interstitial prominence. No suspicious infiltrates. IMPRESSION: Findings are most consistent with cardiomegaly and mild edema. Electronically Signed   By: Dorise Bullion III M.D.   On: 12/19/2021 15:04   DG Abd Portable 1V  Result Date: 12/18/2021 CLINICAL DATA:  Abdominal pain and discomfort. History of diverticulitis. EXAM: PORTABLE ABDOMEN - 1 VIEW COMPARISON:  January 12, 2017 FINDINGS: A 5 cm calcification in the left side of the pelvis is likely a calcified fibroid. There may be a smaller calcified fibroid to the right of midline measuring 10 mm. No free air, portal venous gas, or pneumatosis identified on supine imaging. The bowel gas pattern is nonobstructive. No other acute abnormalities. IMPRESSION: 1. Evaluation for free air is limited on supine imaging but none is identified. 2. Nonobstructive bowel gas pattern. 3. No other acute abnormalities. Electronically Signed   By: Dorise Bullion III M.D.   On: 12/18/2021 09:18   NM Hepatobiliary Liver Func  Result Date: 12/17/2021 CLINICAL DATA:  Epigastric pain.  Biliary colic.  Cholelithiasis. EXAM: NUCLEAR MEDICINE HEPATOBILIARY IMAGING TECHNIQUE: Sequential images of the abdomen were obtained out to 60 minutes following intravenous administration of radiopharmaceutical. Due to lack of gallbladder activity  at 60 minutes, slow intravenous infusion of 3 mg morphine was given wall imaging was continued for another 30 minutes. RADIOPHARMACEUTICALS:  4.9 mCi Tc-15m Choletec IV COMPARISON:  None Available. FINDINGS: Prompt uptake and biliary excretion of activity by the liver is seen. Biliary activity passes into small bowel promptly, consistent with patent common bile duct. Gallbladder activity was not seen in the 1st 60 minutes, however did occur following morphine administration consistent with patency of cystic duct. IMPRESSION: Cystic duct is patent, however delayed gallbladder filling is seen following morphine administration,  likely due to known cholelithiasis. No evidence of biliary obstruction. Electronically Signed   By: JMarlaine HindM.D.   On: 12/17/2021 17:11   UK[place]ABDOMEN LIMITED RUQ (LIVER/GB)  Result Date: 12/16/2021 CLINICAL DATA:  EPIGASTRIC PAIN. EXAM: ULTRASOUND ABDOMEN LIMITED RIGHT UPPER QUADRANT COMPARISON:  MRI 12/03/2021, ULTRASOUND 12/09/2021. FINDINGS: Gallbladder: single 1.6 cm stone again noted in the proximal gallbladder. No sonographic Murphy sign noted by sonographer. There is no wall thickening or pericholecystic fluid. Common bile duct: Diameter: 4.6 mm.  No intrahepatic biliary dilatation is seen. Liver: No focal lesion identified. There is mild increased hepatic echogenicity consistent with steatosis. Portal vein is patent on color Doppler imaging with normal direction of blood flow towards the liver. Other: There is mild increased echogenicity in the right renal cortex consistent with medical renal disease. IMPRESSION: 1. Cholelithiasis without sonographic findings of acute cholecystitis. 2. Increased liver echogenicity of steatosis. 3. No biliary dilatation. 4. Mild increased right renal cortical echogenicity consistent with medical renal disease. Electronically Signed   By: KTelford NabM.D.   On: 12/16/2021 23:51   DG Chest 1 View  Result Date: 12/16/2021 CLINICAL DATA:  Nausea and vomiting with abdominal pain. EXAM: CHEST  1 VIEW COMPARISON:  Radiograph 11/20/2021 FINDINGS: Costophrenic angles are excluded from the field of view. Similar cardiomegaly. Chronic peribronchial thickening. No acute airspace disease. No pleural effusion or pneumothorax. Chronic shoulder degenerative change. IMPRESSION: Similar cardiomegaly and chronic peribronchial thickening. No acute abnormality. Electronically Signed   By: MKeith RakeM.D.   On: 12/16/2021 22:07   [place]ABDOMEN LIMITED RUQ (LIVER/GB)  Result Date: 12/10/2021 CLINICAL DATA:  Abdominal pain EXAM: ULTRASOUND ABDOMEN LIMITED RIGHT UPPER QUADRANT  COMPARISON:  Ultrasound 11/22/2021.  MRI 12/03/2021. FINDINGS: Gallbladder: Single gallstone measures 1.6 cm. No wall thickening or sonographic Murphy sign. Common bile duct: Diameter: Normal caliber, 6 mm. Liver: No focal lesion identified. Within normal limits in parenchymal echogenicity. Portal vein is patent on color Doppler imaging with normal direction of blood flow towards the liver. Other: None. IMPRESSION: Cholelithiasis.  No sonographic evidence of acute cholecystitis. Electronically Signed   By: KRolm BaptiseM.D.   On: 12/10/2021 00:15   MR ABDOMEN MRCP W WO CONTAST  Result Date: 12/03/2021 CLINICAL DATA:  Cholelithiasis, intrahepatic biliary ductal dilatation, abdominal pain for 2-3 weeks EXAM: MRI ABDOMEN WITHOUT AND WITH CONTRAST (INCLUDING MRCP) TECHNIQUE: Multiplanar multisequence MR imaging of the abdomen was performed both before and after the administration of intravenous contrast. Heavily T2-weighted images of the biliary and pancreatic ducts were obtained, and three-dimensional MRCP images were rendered by post processing. CONTRAST:  184mGADAVIST GADOBUTROL 1 MMOL/ML IV SOLN COMPARISON:  CT abdomen pelvis, 11/20/2021, 11/04/2019 FINDINGS: Lower chest: No acute findings.  Cardiomegaly. Hepatobiliary: No mass or other parenchymal abnormality identified. Single gallstone near the gallbladder neck measuring 1.0 cm (series 4, image 15). Mild intrahepatic biliary ductal dilatation is unchanged in comparison to recent CT of the abdomen and  pelvis as well as a more remote prior examination dated 11/04/2019, however the central common bile duct is patent and nondilated, measuring no greater than 0.5 cm. Pancreas: No mass, inflammatory changes, or other parenchymal abnormality identified.No pancreatic ductal dilatation. Spleen:  Within normal limits in size and appearance. Adrenals/Urinary Tract: Normal adrenal glands. No renal masses or suspicious contrast enhancement identified. No evidence of  hydronephrosis. Stomach/Bowel: Incidental large diverticulum of the descending duodenum (series 9, image 20). Severe pancolonic diverticulosis. Vascular/Lymphatic: No pathologically enlarged lymph nodes identified. No abdominal aortic aneurysm demonstrated. Other:  None. Musculoskeletal: No suspicious osseous lesions identified. IMPRESSION: 1. Single gallstone near the gallbladder neck measuring 1.0 cm. No gallbladder wall thickening or pericholecystic fluid. 2. Mild intrahepatic biliary ductal dilatation is unchanged in comparison to examinations dating back to 11/04/2019 and presumably of no clinical significance. The central common bile duct is patent and nondilated without obstructing calculus visualized. The possibility of a central biliary ductal stricture or other abnormality may be further interrogated by ERCP if there is high clinical suspicion for biliary obstruction based on cholestasis or other evidence. 3. Severe pancolonic diverticulosis. Electronically Signed   By: Delanna Ahmadi M.D.   On: 12/03/2021 16:09   MR 3D Recon At Scanner  Result Date: 12/03/2021 CLINICAL DATA:  Cholelithiasis, intrahepatic biliary ductal dilatation, abdominal pain for 2-3 weeks EXAM: MRI ABDOMEN WITHOUT AND WITH CONTRAST (INCLUDING MRCP) TECHNIQUE: Multiplanar multisequence MR imaging of the abdomen was performed both before and after the administration of intravenous contrast. Heavily T2-weighted images of the biliary and pancreatic ducts were obtained, and three-dimensional MRCP images were rendered by post processing. CONTRAST:  23m GADAVIST GADOBUTROL 1 MMOL/ML IV SOLN COMPARISON:  CT abdomen pelvis, 11/20/2021, 11/04/2019 FINDINGS: Lower chest: No acute findings.  Cardiomegaly. Hepatobiliary: No mass or other parenchymal abnormality identified. Single gallstone near the gallbladder neck measuring 1.0 cm (series 4, image 15). Mild intrahepatic biliary ductal dilatation is unchanged in comparison to recent CT of the  abdomen and pelvis as well as a more remote prior examination dated 11/04/2019, however the central common bile duct is patent and nondilated, measuring no greater than 0.5 cm. Pancreas: No mass, inflammatory changes, or other parenchymal abnormality identified.No pancreatic ductal dilatation. Spleen:  Within normal limits in size and appearance. Adrenals/Urinary Tract: Normal adrenal glands. No renal masses or suspicious contrast enhancement identified. No evidence of hydronephrosis. Stomach/Bowel: Incidental large diverticulum of the descending duodenum (series 9, image 20). Severe pancolonic diverticulosis. Vascular/Lymphatic: No pathologically enlarged lymph nodes identified. No abdominal aortic aneurysm demonstrated. Other:  None. Musculoskeletal: No suspicious osseous lesions identified. IMPRESSION: 1. Single gallstone near the gallbladder neck measuring 1.0 cm. No gallbladder wall thickening or pericholecystic fluid. 2. Mild intrahepatic biliary ductal dilatation is unchanged in comparison to examinations dating back to 11/04/2019 and presumably of no clinical significance. The central common bile duct is patent and nondilated without obstructing calculus visualized. The possibility of a central biliary ductal stricture or other abnormality may be further interrogated by ERCP if there is high clinical suspicion for biliary obstruction based on cholestasis or other evidence. 3. Severe pancolonic diverticulosis. Electronically Signed   By: ADelanna AhmadiM.D.   On: 12/03/2021 16:09      Subjective: No n/v/abd pain. Tolerated diet.   Discharge Exam: Vitals:   12/23/21 0430 12/23/21 0740  BP: (!) 167/64 (!) 176/96  Pulse: 63 65  Resp:  18  Temp:  98.3 F (36.8 C)  SpO2:  94%   Vitals:   12/23/21 0414 12/23/21  0430 12/23/21 0453 12/23/21 0740  BP: (!) 187/73 (!) 167/64  (!) 176/96  Pulse: 65 63  65  Resp: 15   18  Temp: 98.2 F (36.8 C)   98.3 F (36.8 C)  TempSrc:      SpO2: 96%   94%   Weight:   100.7 kg     General: Pt is alert, awake, not in acute distress Cardiovascular: RRR, S1/S2 +, no rubs, no gallops Respiratory: CTA bilaterally, no wheezing, no rhonchi Abdominal: Soft, NT, ND, bowel sounds + Extremities: no edema, no cyanosis    The results of significant diagnostics from this hospitalization (including imaging, microbiology, ancillary and laboratory) are listed below for reference.     Microbiology: No results found for this or any previous visit (from the past 240 hour(s)).   Labs: BNP (last 3 results) No results for input(s): "BNP" in the last 8760 hours. Basic Metabolic Panel: Recent Labs  Lab 12/18/21 0904 12/19/21 0435 12/20/21 0618 12/21/21 0551 12/22/21 0442  NA 142 141 143 137 135  K 3.8 4.1 3.7 3.1* 3.2*  CL 105 107 107 93* 91*  CO2 '31 27 29 '$ 35* 36*  GLUCOSE 107* 150* 122* 120* 111*  BUN '20 20 23 14 17  '$ CREATININE 0.87 1.12* 0.90 0.60 0.99  CALCIUM 9.0 9.0 9.2 9.2 9.0  MG 2.0 2.1 2.2 1.9 1.8   Liver Function Tests: No results for input(s): "AST", "ALT", "ALKPHOS", "BILITOT", "PROT", "ALBUMIN" in the last 168 hours.  No results for input(s): "LIPASE", "AMYLASE" in the last 168 hours.  No results for input(s): "AMMONIA" in the last 168 hours. CBC: Recent Labs  Lab 12/19/21 0435 12/20/21 0618 12/21/21 0551 12/22/21 0442  WBC 5.7 6.6 6.5 7.2  HGB 11.7* 12.0 12.8 12.8  HCT 39.3 39.3 41.8 41.5  MCV 91.4 88.5 88.9 87.9  PLT 199 187 188 194   Cardiac Enzymes: No results for input(s): "CKTOTAL", "CKMB", "CKMBINDEX", "TROPONINI" in the last 168 hours. BNP: Invalid input(s): "POCBNP" CBG: No results for input(s): "GLUCAP" in the last 168 hours. D-Dimer No results for input(s): "DDIMER" in the last 72 hours. Hgb A1c No results for input(s): "HGBA1C" in the last 72 hours. Lipid Profile No results for input(s): "CHOL", "HDL", "LDLCALC", "TRIG", "CHOLHDL", "LDLDIRECT" in the last 72 hours. Thyroid function studies No  results for input(s): "TSH", "T4TOTAL", "T3FREE", "THYROIDAB" in the last 72 hours.  Invalid input(s): "FREET3" Anemia work up No results for input(s): "VITAMINB12", "FOLATE", "FERRITIN", "TIBC", "IRON", "RETICCTPCT" in the last 72 hours. Urinalysis    Component Value Date/Time   COLORURINE YELLOW (A) 12/09/2021 2306   APPEARANCEUR CLEAR (A) 12/09/2021 2306   APPEARANCEUR Clear 04/05/2013 0848   LABSPEC 1.020 12/09/2021 2306   LABSPEC 1.010 04/05/2013 0848   PHURINE 5.0 12/09/2021 2306   GLUCOSEU NEGATIVE 12/09/2021 2306   GLUCOSEU Negative 04/05/2013 0848   HGBUR SMALL (A) 12/09/2021 2306   BILIRUBINUR NEGATIVE 12/09/2021 2306   BILIRUBINUR neg 04/15/2016 1647   BILIRUBINUR Negative 04/05/2013 0848   KETONESUR NEGATIVE 12/09/2021 2306   PROTEINUR NEGATIVE 12/09/2021 2306   UROBILINOGEN 0.2 04/15/2016 1647   NITRITE NEGATIVE 12/09/2021 2306   LEUKOCYTESUR NEGATIVE 12/09/2021 2306   LEUKOCYTESUR Negative 04/05/2013 0848   Sepsis Labs Recent Labs  Lab 12/19/21 0435 12/20/21 0618 12/21/21 0551 12/22/21 0442  WBC 5.7 6.6 6.5 7.2   Microbiology No results found for this or any previous visit (from the past 240 hour(s)).   Time coordinating discharge: Over 30 minutes  SIGNED:  Nolberto Hanlon, MD  [facility] 12/24/2021, 4:26 PM Pager   If 7PM-7AM, please contact night-coverage [email or web address] Password [email or web address]

## 2021-12-24 DIAGNOSIS — K8689 Other specified diseases of pancreas: Secondary | ICD-10-CM | POA: Diagnosis not present

## 2021-12-24 DIAGNOSIS — K219 Gastro-esophageal reflux disease without esophagitis: Secondary | ICD-10-CM | POA: Diagnosis not present

## 2021-12-24 DIAGNOSIS — K5730 Diverticulosis of large intestine without perforation or abscess without bleeding: Secondary | ICD-10-CM | POA: Diagnosis not present

## 2021-12-24 DIAGNOSIS — H8111 Benign paroxysmal vertigo, right ear: Secondary | ICD-10-CM | POA: Diagnosis not present

## 2021-12-24 DIAGNOSIS — Z9181 History of falling: Secondary | ICD-10-CM | POA: Diagnosis not present

## 2021-12-24 DIAGNOSIS — I10 Essential (primary) hypertension: Secondary | ICD-10-CM | POA: Diagnosis not present

## 2021-12-24 DIAGNOSIS — E538 Deficiency of other specified B group vitamins: Secondary | ICD-10-CM | POA: Diagnosis not present

## 2021-12-24 DIAGNOSIS — J45909 Unspecified asthma, uncomplicated: Secondary | ICD-10-CM | POA: Diagnosis not present

## 2021-12-24 DIAGNOSIS — G8929 Other chronic pain: Secondary | ICD-10-CM | POA: Diagnosis not present

## 2021-12-24 DIAGNOSIS — K3184 Gastroparesis: Secondary | ICD-10-CM | POA: Diagnosis not present

## 2021-12-24 DIAGNOSIS — I951 Orthostatic hypotension: Secondary | ICD-10-CM | POA: Diagnosis not present

## 2021-12-24 DIAGNOSIS — K8020 Calculus of gallbladder without cholecystitis without obstruction: Secondary | ICD-10-CM | POA: Diagnosis not present

## 2021-12-24 DIAGNOSIS — R1310 Dysphagia, unspecified: Secondary | ICD-10-CM | POA: Diagnosis not present

## 2021-12-24 DIAGNOSIS — G4733 Obstructive sleep apnea (adult) (pediatric): Secondary | ICD-10-CM | POA: Diagnosis not present

## 2021-12-24 DIAGNOSIS — R6250 Unspecified lack of expected normal physiological development in childhood: Secondary | ICD-10-CM | POA: Diagnosis not present

## 2021-12-27 ENCOUNTER — Telehealth: Payer: Self-pay | Admitting: Internal Medicine

## 2021-12-27 DIAGNOSIS — K5730 Diverticulosis of large intestine without perforation or abscess without bleeding: Secondary | ICD-10-CM | POA: Diagnosis not present

## 2021-12-27 DIAGNOSIS — J45909 Unspecified asthma, uncomplicated: Secondary | ICD-10-CM | POA: Diagnosis not present

## 2021-12-27 DIAGNOSIS — Z9181 History of falling: Secondary | ICD-10-CM | POA: Diagnosis not present

## 2021-12-27 DIAGNOSIS — E538 Deficiency of other specified B group vitamins: Secondary | ICD-10-CM | POA: Diagnosis not present

## 2021-12-27 DIAGNOSIS — G4733 Obstructive sleep apnea (adult) (pediatric): Secondary | ICD-10-CM | POA: Diagnosis not present

## 2021-12-27 DIAGNOSIS — K8689 Other specified diseases of pancreas: Secondary | ICD-10-CM | POA: Diagnosis not present

## 2021-12-27 DIAGNOSIS — R6250 Unspecified lack of expected normal physiological development in childhood: Secondary | ICD-10-CM | POA: Diagnosis not present

## 2021-12-27 DIAGNOSIS — K219 Gastro-esophageal reflux disease without esophagitis: Secondary | ICD-10-CM | POA: Diagnosis not present

## 2021-12-27 DIAGNOSIS — K8020 Calculus of gallbladder without cholecystitis without obstruction: Secondary | ICD-10-CM | POA: Diagnosis not present

## 2021-12-27 DIAGNOSIS — R1310 Dysphagia, unspecified: Secondary | ICD-10-CM | POA: Diagnosis not present

## 2021-12-27 DIAGNOSIS — I951 Orthostatic hypotension: Secondary | ICD-10-CM | POA: Diagnosis not present

## 2021-12-27 DIAGNOSIS — K3184 Gastroparesis: Secondary | ICD-10-CM | POA: Diagnosis not present

## 2021-12-27 DIAGNOSIS — I10 Essential (primary) hypertension: Secondary | ICD-10-CM | POA: Diagnosis not present

## 2021-12-27 DIAGNOSIS — G8929 Other chronic pain: Secondary | ICD-10-CM | POA: Diagnosis not present

## 2021-12-27 DIAGNOSIS — H8111 Benign paroxysmal vertigo, right ear: Secondary | ICD-10-CM | POA: Diagnosis not present

## 2021-12-27 NOTE — Telephone Encounter (Signed)
Home Health Verbal Orders - Caller/Agency: Le[facility]lowman Callback Number: [phone] Requesting OT Frequency: 1x1 2x2 1x1

## 2021-12-27 NOTE — Telephone Encounter (Signed)
Called [facility] gave verbal order. She verbalized understanding.  KP

## 2021-12-28 ENCOUNTER — Other Ambulatory Visit (INDEPENDENT_AMBULATORY_CARE_PROVIDER_SITE_OTHER): Payer: Medicare Other | Admitting: Internal Medicine

## 2021-12-28 DIAGNOSIS — J4520 Mild intermittent asthma, uncomplicated: Secondary | ICD-10-CM

## 2021-12-28 DIAGNOSIS — G4733 Obstructive sleep apnea (adult) (pediatric): Secondary | ICD-10-CM

## 2021-12-28 DIAGNOSIS — K219 Gastro-esophageal reflux disease without esophagitis: Secondary | ICD-10-CM | POA: Diagnosis not present

## 2021-12-28 DIAGNOSIS — K3184 Gastroparesis: Secondary | ICD-10-CM | POA: Diagnosis not present

## 2021-12-28 DIAGNOSIS — I951 Orthostatic hypotension: Secondary | ICD-10-CM

## 2021-12-28 DIAGNOSIS — K8689 Other specified diseases of pancreas: Secondary | ICD-10-CM

## 2021-12-28 DIAGNOSIS — H8111 Benign paroxysmal vertigo, right ear: Secondary | ICD-10-CM | POA: Diagnosis not present

## 2021-12-28 DIAGNOSIS — K5730 Diverticulosis of large intestine without perforation or abscess without bleeding: Secondary | ICD-10-CM | POA: Diagnosis not present

## 2021-12-28 DIAGNOSIS — R109 Unspecified abdominal pain: Secondary | ICD-10-CM

## 2021-12-28 DIAGNOSIS — K8020 Calculus of gallbladder without cholecystitis without obstruction: Secondary | ICD-10-CM

## 2021-12-28 DIAGNOSIS — E538 Deficiency of other specified B group vitamins: Secondary | ICD-10-CM | POA: Diagnosis not present

## 2021-12-28 DIAGNOSIS — R1310 Dysphagia, unspecified: Secondary | ICD-10-CM | POA: Diagnosis not present

## 2021-12-28 DIAGNOSIS — Z9181 History of falling: Secondary | ICD-10-CM | POA: Diagnosis not present

## 2021-12-28 DIAGNOSIS — G8929 Other chronic pain: Secondary | ICD-10-CM

## 2021-12-28 DIAGNOSIS — N3946 Mixed incontinence: Secondary | ICD-10-CM

## 2021-12-28 DIAGNOSIS — F411 Generalized anxiety disorder: Secondary | ICD-10-CM

## 2021-12-28 DIAGNOSIS — I10 Essential (primary) hypertension: Secondary | ICD-10-CM

## 2021-12-28 DIAGNOSIS — R6250 Unspecified lack of expected normal physiological development in childhood: Secondary | ICD-10-CM | POA: Diagnosis not present

## 2021-12-28 DIAGNOSIS — F325 Major depressive disorder, single episode, in full remission: Secondary | ICD-10-CM

## 2021-12-28 DIAGNOSIS — J45909 Unspecified asthma, uncomplicated: Secondary | ICD-10-CM | POA: Diagnosis not present

## 2021-12-28 NOTE — Progress Notes (Signed)
Received home health orders orders from [facility]. Start of care 12/24/21.   Certification and orders from 12/24/21  through 8.7.23 are reviewed, signed and faxed back to home health company.  Need of intermittent skilled services at home: weakness  The home health care plan has been established by me and will be reviewed and updated as needed to maximize patient recovery.  I certify that all home health services have been and will be furnished to the patient while under my care.  Face-to-face encounter in which the need for home health services was established: at hospital discharge.  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

## 2021-12-29 ENCOUNTER — Ambulatory Visit (INDEPENDENT_AMBULATORY_CARE_PROVIDER_SITE_OTHER): Payer: Medicare Other | Admitting: Surgery

## 2021-12-29 ENCOUNTER — Encounter: Payer: Self-pay | Admitting: Surgery

## 2021-12-29 ENCOUNTER — Other Ambulatory Visit: Payer: Self-pay

## 2021-12-29 ENCOUNTER — Encounter: Payer: Self-pay | Admitting: Internal Medicine

## 2021-12-29 ENCOUNTER — Ambulatory Visit (INDEPENDENT_AMBULATORY_CARE_PROVIDER_SITE_OTHER): Payer: Medicare Other | Admitting: Internal Medicine

## 2021-12-29 VITALS — BP 126/64 | HR 87 | Ht 64.0 in | Wt 226.0 lb

## 2021-12-29 VITALS — BP 112/72 | HR 76 | Temp 98.8°F | Ht 64.0 in | Wt 223.6 lb

## 2021-12-29 DIAGNOSIS — R1030 Lower abdominal pain, unspecified: Secondary | ICD-10-CM | POA: Diagnosis not present

## 2021-12-29 DIAGNOSIS — K8020 Calculus of gallbladder without cholecystitis without obstruction: Secondary | ICD-10-CM

## 2021-12-29 DIAGNOSIS — R1084 Generalized abdominal pain: Secondary | ICD-10-CM | POA: Diagnosis not present

## 2021-12-29 DIAGNOSIS — G8929 Other chronic pain: Secondary | ICD-10-CM

## 2021-12-29 DIAGNOSIS — I10 Essential (primary) hypertension: Secondary | ICD-10-CM

## 2021-12-29 DIAGNOSIS — F411 Generalized anxiety disorder: Secondary | ICD-10-CM

## 2021-12-29 NOTE — Patient Instructions (Addendum)
Schedule GI follow up with Dr. Marius Ditch   [phone]

## 2021-12-29 NOTE — Progress Notes (Signed)
Date:  12/29/2021   Name:  Taylor Johnson   DOB:  [birth year]   MRN:  [id number]   Chief Complaint: Hospitalization Follow-up Hospital follow up.  Admitted to [facility]  12/16/21 to 12/23/21.  TOC call done on 12/23/21.  Discharge Diagnoses:  Principal Problem:   Abdominal pain, chronic, generalized Active Problems:   Hypertensive urgency   OSA on CPAP   Developmental delay disorder   Benign essential HTN   GERD (gastroesophageal reflux disease)   Morbid obesity (HCC)   Pancreatic insufficiency   HPI Brief/Interim Summary: Per HPI:76 year old female with past medical history of GERD, HTN, chronic abdominal pain, chronic Reglan use, OSA on CPAP presents to the hospital with complaints of recurrent abdominal pain in epigastric region for last 2 weeks. HIDA scan was performed which is negative.  General surgery was consulted also recommended no intervention. GI consulted.  Started on gabapentin.  Due to drowsiness currently gabapentin is discontinued. GI has switch from IV Reglan to IV erythromycin and GI recommended to start the patient on IV antibiotics for possible intra-abdominal infection.  No imaging recommended. Psychiatric consult on 6/6 to assist with possible somatization.  Agreed with Zyprexa 2.5 mg and recommended out patient psych referral. She has tolerated her slow advancing of diet. She is stable for discharge.  Recommendations for Outpatient Follow-up:  Follow up with PCP in 1 week Please obtain BMP/CBC in one week Please follow up with GI in 2 weeks   She continued to describe a bubbling in her lower abdomen.  It comes and goes without clear relationship to food.  General surgery appt today discussed the gall stones and did not recommend surgery.  She has not yet scheduled appointment with GI.  Lab Results  Component Value Date   NA 135 12/22/2021   K 3.2 (L) 12/22/2021   CO2 36 (H) 12/22/2021   GLUCOSE 111 (H) 12/22/2021   BUN 17 12/22/2021   CREATININE 0.99  12/22/2021   CALCIUM 9.0 12/22/2021   EGFR 55 (L) 06/15/2021   GFRNONAA 59 (L) 12/22/2021   Lab Results  Component Value Date   CHOL 163 02/05/2019   HDL 63 02/05/2019   LDLCALC 86 02/05/2019   TRIG 72 02/05/2019   CHOLHDL 2.6 02/05/2019   Lab Results  Component Value Date   TSH 1.150 06/04/2020   Lab Results  Component Value Date   HGBA1C 6.1 (H) 01/31/2017   Lab Results  Component Value Date   WBC 7.2 12/22/2021   HGB 12.8 12/22/2021   HCT 41.5 12/22/2021   MCV 87.9 12/22/2021   PLT 194 12/22/2021   Lab Results  Component Value Date   ALT 9 12/17/2021   AST 12 (L) 12/17/2021   ALKPHOS 74 12/17/2021   BILITOT 0.4 12/17/2021   No results found for: "25OHVITD2", "25OHVITD3", "VD25OH"   Review of Systems  Constitutional:  Negative for chills, fatigue, fever and unexpected weight change.  Respiratory:  Negative for chest tightness and shortness of breath.   Cardiovascular:  Negative for chest pain.  Gastrointestinal:  Positive for nausea (intermittent). Negative for abdominal distention, constipation, diarrhea and vomiting (she described spitting up.).  Neurological:  Positive for light-headedness (has not eaten today except applesauce this AM). Negative for dizziness and headaches.  Psychiatric/Behavioral:  Negative for dysphoric mood and sleep disturbance. The patient is not nervous/anxious.     Patient Active Problem List   Diagnosis Date Noted   Abdominal pain, chronic, generalized 12/16/2021   GI bleeding 06/07/2021  CPAP use counseling 08/10/2020   Morbid obesity ([place]) 08/10/2020   GERD (gastroesophageal reflux disease) 11/07/2019   Dizziness 02/04/2019   Benign essential HTN 10/23/2018   Functional systolic murmur 70/07/7492   Developmental delay disorder 02/06/2017   Calculus of gallbladder without cholecystitis without obstruction 01/20/2017   Localized edema 04/15/2016   Microscopic hematuria 04/15/2016   OSA on CPAP 11/18/2015   Urge incontinence  of urine 05/12/2015   Diverticulosis of colon 05/11/2015   Anxiety, generalized 05/11/2015    No Known Allergies  Past Surgical History:  Procedure Laterality Date   ABDOMINAL HYSTERECTOMY     BREAST BIOPSY Left    neg   COLON SURGERY     COLONOSCOPY WITH PROPOFOL N/A 12/13/2019   Procedure: COLONOSCOPY WITH PROPOFOL;  Surgeon: Lin Landsman, MD;  Location: [place];  Service: Endoscopy;  Laterality: N/A;   ESOPHAGOGASTRODUODENOSCOPY (EGD) WITH PROPOFOL N/A 01/29/2017   Procedure: ESOPHAGOGASTRODUODENOSCOPY (EGD) WITH PROPOFOL;  Surgeon: Wilford Corner, MD;  Location: [facility] ENDOSCOPY;  Service: Endoscopy;  Laterality: N/A;   ESOPHAGOGASTRODUODENOSCOPY (EGD) WITH PROPOFOL N/A 08/02/2021   Procedure: ESOPHAGOGASTRODUODENOSCOPY (EGD) WITH PROPOFOL;  Surgeon: Lin Landsman, MD;  Location: [facility] ENDOSCOPY;  Service: Gastroenterology;  Laterality: N/A;    Social History   Tobacco Use   Smoking status: Never   Smokeless tobacco: Never   Tobacco comments:    smoking cessation materials not required  Vaping Use   Vaping Use: Never used  Substance Use Topics   Alcohol use: No    Alcohol/week: 0.0 standard drinks of alcohol   Drug use: No     Medication list has been reviewed and updated.  Current Meds  Medication Sig   hydrALAZINE (APRESOLINE) 25 MG tablet TAKE ONE TABLET BY MOUTH THREE TIMES DAILY   meclizine (ANTIVERT) 12.5 MG tablet TAKE ONE TABLET BY MOUTH THREE TIMES DAILY AS NEEDED FOR DIZZINESS   metoCLOPramide (REGLAN) 5 MG tablet TAKE ONE TABLET BY MOUTH EVERY MORNING, NOON, EVENING   OLANZapine (ZYPREXA) 2.5 MG tablet Take 1 tablet (2.5 mg total) by mouth at bedtime.   ondansetron (ZOFRAN-ODT) 4 MG disintegrating tablet Take 1 tablet (4 mg total) by mouth every 8 (eight) hours as needed for nausea or vomiting.   pantoprazole (PROTONIX) 40 MG tablet Take 1 tablet (40 mg total) by mouth 2 (two) times daily.   Saccharomyces boulardii (PROBIOTIC) 250 MG CAPS  Take 1 capsule by mouth in the morning and at bedtime.   senna-docusate (SENOKOT-S) 8.6-50 MG tablet Take 1 tablet by mouth at bedtime as needed for up to 10 days for mild constipation.       12/29/2021    1:14 PM 06/15/2021    2:05 PM 06/04/2020   10:59 AM  GAD 7 : Generalized Anxiety Score  Nervous, Anxious, on Edge 0 0 1  Control/stop worrying 1 0 1  Worry too much - different things 2 0 1  Trouble relaxing 2 0 0  Restless 2 0 0  Easily annoyed or irritable 2 0 0  Afraid - awful might happen 0 0 0  Total GAD 7 Score 9 0 3  Anxiety Difficulty Somewhat difficult         12/29/2021    1:14 PM  Depression screen PHQ 2/9  Decreased Interest 1  Down, Depressed, Hopeless 1  PHQ - 2 Score 2  Altered sleeping 2  Tired, decreased energy 0  Change in appetite 2  Feeling bad or failure about yourself  1  Trouble concentrating  0  Moving slowly or fidgety/restless 2  Suicidal thoughts 0  PHQ-9 Score 9  Difficult doing work/chores Not difficult at all    BP Readings from Last 3 Encounters:  12/29/21 126/64  12/29/21 112/72  12/23/21 (!) 176/96    Physical Exam Vitals and nursing note reviewed.  Constitutional:      General: She is not in acute distress.    Appearance: She is well-developed. She is obese.  HENT:     Head: Normocephalic and atraumatic.  Cardiovascular:     Rate and Rhythm: Normal rate and regular rhythm.  Pulmonary:     Effort: Pulmonary effort is normal. No respiratory distress.     Breath sounds: No wheezing or rhonchi.  Abdominal:     General: There is no distension.     Palpations: Abdomen is soft. There is no mass.     Tenderness: There is no abdominal tenderness.  Musculoskeletal:     Cervical back: Normal range of motion.     Right lower leg: No edema.     Left lower leg: No edema.  Lymphadenopathy:     Cervical: No cervical adenopathy.  Skin:    General: Skin is warm and dry.     Findings: No rash.  Neurological:     Mental Status: She  is alert and oriented to person, place, and time. Mental status is at baseline.  Psychiatric:        Mood and Affect: Mood normal.        Behavior: Behavior normal.     Wt Readings from Last 3 Encounters:  12/29/21 226 lb (102.5 kg)  12/29/21 223 lb 9.6 oz (101.4 kg)  12/23/21 222 lb (100.7 kg)    BP 126/64   Pulse 87   Ht 5' 4"  (1.626 m)   Wt 226 lb (102.5 kg)   SpO2 96%   BMI 38.79 kg/m   Assessment and Plan: 1. Abdominal pain, chronic, generalized This has been a chronic complaint for some time. Full work has only revealed gall stones with no clear relationship to her discomfort I discussed with patient and sister to limit fried foods and to stop all milk and yogurt products, including Ensure which is dairy based. - Basic metabolic panel  2. Benign essential HTN Clinically stable exam with well controlled BP. Tolerating medications without side effects at this time. Pt to continue current regimen and low sodium diet; benefits of regular exercise as able discussed. - CBC with Differential/Platelet  3. Anxiety, generalized Now on Zyprexa.  Per sister, she is calmer and let anxious. Will plan to continue this therapy. Would like Psych to see but have not had success with this referral in the past.  Partially dictated using Editor, commissioning. Any errors are unintentional.  Halina Maidens, MD [facility]  12/29/2021

## 2021-12-29 NOTE — Patient Instructions (Addendum)
Please call with any questions or concerns.  Gallbladder Eating Plan High blood cholesterol, obesity, a sedentary lifestyle, an unhealthy diet, and diabetes are risk factors for developing gallstones. If you have a gallbladder condition, you may have trouble digesting fats and tolerating high fat intake. Eating a low-fat diet can help reduce your symptoms and may be helpful before and after having surgery to remove your gallbladder (cholecystectomy). Your health care provider may recommend that you work with a dietitian to help you reduce the amount of fat in your diet. What are tips for following this plan? General guidelines Limit your fat intake to less than 30% of your total daily calories. If you eat around 1,800 calories each day, this means eating less than 60 grams (g) of fat per day. Fat is an important part of a healthy diet. Eating a low-fat diet can make it hard to maintain a healthy body weight. Ask your dietitian how much fat, calories, and other nutrients you need each day. Eat small, frequent meals throughout the day instead of three large meals. Drink at least 8-10 cups (1.9-2.4 L) of fluid a day. Drink enough fluid to keep your urine pale yellow. If you drink alcohol: Limit how much you have to: 0-1 drink a day for women who are not pregnant. 0-2 drinks a day for men. Know how much alcohol is in a drink. In the [place] one drink equals one 12 oz bottle of beer (355 mL), one 5 oz glass of wine (148 mL), or one 1 oz glass of hard liquor (44 mL). Reading food labels  Check nutrition facts on food labels for the amount of fat per serving. Choose foods with less than 3 grams of fat per serving. Shopping Choose nonfat and low-fat healthy foods. Look for the words "nonfat," "low-fat," or "fat-free." Avoid buying processed or prepackaged foods. Cooking Cook using low-fat methods, such as baking, broiling, grilling, or boiling. Cook with small amounts of healthy fats, such as olive  oil, grapeseed oil, canola oil, avocado oil, or sunflower oil. What foods are recommended?  All fresh, frozen, or canned fruits and vegetables. Whole grains. Low-fat or nonfat (skim) milk and yogurt. Lean meat, skinless poultry, fish, eggs, and beans. Low-fat protein supplement powders or drinks. Spices and herbs. The items listed above may not be a complete list of foods and beverages you can eat and drink. Contact a dietitian for more information. What foods are not recommended? High-fat foods. These include baked goods, fast food, fatty cuts of meat, ice cream, french toast, sweet rolls, pizza, cheese bread, foods covered with butter, creamy sauces, or cheese. Fried foods. These include french fries, tempura, battered fish, breaded chicken, fried breads, and sweets. Foods that cause bloating and gas. The items listed above may not be a complete list of foods that you should avoid. Contact a dietitian for more information. Summary A low-fat diet can be helpful if you have a gallbladder condition, or before and after gallbladder surgery. Limit your fat intake to less than 30% of your total daily calories. This is about 60 g of fat if you eat 1,800 calories each day. Eat small, frequent meals throughout the day instead of three large meals. This information is not intended to replace advice given to you by your health care provider. Make sure you discuss any questions you have with your health care provider. Document Revised: 06/18/2021 Document Reviewed: 06/18/2021 Elsevier Patient Education  [place]. Cholelithiasis  Cholelithiasis is a disease in which  gallstones form in the gallbladder. The gallbladder is an organ that stores bile. Bile is a fluid that helps to digest fats. Gallstones begin as small crystals and can slowly grow into stones. They may cause no symptoms until they block the gallbladder duct, or cystic duct, when the gallbladder tightens (contracts) after food is  eaten. This can cause pain and is known as a gallbladder attack, or biliary colic. There are two main types of gallstones: Cholesterol stones. These are the most common type of gallstone. These stones are made of hardened cholesterol and are usually yellow-green in color. Cholesterol is a fat-like substance that is made in the liver. Pigment stones. These are dark in color and are made of a red-yellow substance, called bilirubin,that forms when hemoglobin from red blood cells breaks down. What are the causes? This condition may be caused by an imbalance in the different parts that make bile. This can happen if the bile: Has too much bilirubin. This can happen in certain blood diseases, such as sickle cell anemia. Has too much cholesterol. Does not have enough bile salts. These salts help the body absorb and digest fats. In some cases, this condition can also be caused by the gallbladder not emptying completely or often enough. This is common during pregnancy. What increases the risk? The following factors may make you more likely to develop this condition: Being female. Having multiple pregnancies. Health care providers sometimes advise removing diseased gallbladders before future pregnancies. Eating a diet that is heavy in fried foods, fat, and refined carbohydrates, such as white bread and white rice. Being obese. Being older than age 35. Using medicines that contain female hormones (estrogen) for a long time. Losing weight quickly. Having a family history of gallstones. Having certain medical problems, such as: Diabetes mellitus. Cystic fibrosis. Crohn's disease. Cirrhosis or other long-term (chronic) liver disease. Certain blood diseases, such as sickle cell anemia or leukemia. What are the signs or symptoms? In many cases, having gallstones causes no symptoms. When you have gallstones but do not have symptoms, you have silent gallstones. If a gallstone blocks your bile duct, it can  cause a gallbladder attack. The main symptom of a gallbladder attack is sudden pain in the upper right part of the abdomen. The pain: Usually comes at night or after eating. Can last for one hour or more. Can spread to your right shoulder, back, or chest. Can feel like indigestion. This is discomfort, burning, or fullness in your upper abdomen. If the bile duct is blocked for more than a few hours, it can cause an infection or inflammation of your gallbladder (cholecystitis), liver, or pancreas. This can cause: Nausea or vomiting. Bloating. Pain in your abdomen that lasts for 5 hours or longer. Tenderness in your upper abdomen, often in the upper right section and under your rib cage. Fever or chills. Skin or the white parts of your eyes turning yellow (jaundice). This usually happens when a stone has blocked bile from passing through the common bile duct. Dark urine or light-colored stools. How is this diagnosed? This condition may be diagnosed based on: A physical exam. Your medical history. Ultrasound. CT scan. MRI. You may also have other tests, including: Blood tests to check for signs of an infection or inflammation. Cholescintigraphy, or HIDA scan. This is a scan of your gallbladder and bile ducts (biliary system) using non-harmful radioactive material and special cameras that can see the radioactive material. Endoscopic retrograde cholangiopancreatogram. This involves inserting a small tube with a  camera on the end (endoscope) through your mouth to look at bile ducts and check for blockages. How is this treated? Treatment for this condition depends on the severity of the condition. Silent gallstones do not need treatment. Treatment may be needed if a blockage causes a gallbladder attack or other symptoms. Treatment may include: Home care, if symptoms are not severe. During a simple gallbladder attack, stop eating and drinking for 12-24 hours (except for water and clear liquids).  This helps to "cool down" your gallbladder. After 1 or 2 days, you can start to eat a diet of simple or clear foods, such as broths and crackers. You may also need medicines for pain or nausea or both. If you have cholecystitis and an infection, you will need antibiotics. A hospital stay, if needed for pain control or for cholecystitis with severe infection. Cholecystectomy, or surgery to remove your gallbladder. This is the most common treatment if all other treatments have not worked. Medicines to break up gallstones. These are most effective at treating small gallstones. Medicines may be used for up to 6-12 months. Endoscopic retrograde cholangiopancreatogram. A small basket can be attached to the endoscope and used to capture and remove gallstones, mainly those that are in the common bile duct. Follow these instructions at home: Medicines Take over-the-counter and prescription medicines only as told by your health care provider. If you were prescribed an antibiotic medicine, take it as told by your health care provider. Do not stop taking the antibiotic even if you start to feel better. Ask your health care provider if the medicine prescribed to you requires you to avoid driving or using machinery. Eating and drinking Drink enough fluid to keep your urine pale yellow. This is important during a gallbladder attack. Water and clear liquids are preferred. Follow a healthy diet. This includes: Reducing fatty foods, such as fried food and foods high in cholesterol. Reducing refined carbohydrates, such as white bread and white rice. Eating more fiber. Aim for foods such as almonds, fruit, and beans. Alcohol use If you drink alcohol: Limit how much you use to: 0-1 drink a day for nonpregnant women. 0-2 drinks a day for men. Be aware of how much alcohol is in your drink. In the [place] one drink equals one 12 oz bottle of beer (355 mL), one 5 oz glass of wine (148 mL), or one 1 oz glass of hard  liquor (44 mL). General instructions Do not use any products that contain nicotine or tobacco, such as cigarettes, e-cigarettes, and chewing tobacco. If you need help quitting, ask your health care provider. Maintain a healthy weight. Keep all follow-up visits as told by your health care provider. These may include consultations with a surgeon or specialist. This is important. Where to find more information [employer] of Diabetes and Digestive and Kidney Diseases: [email or web address] Contact a health care provider if: You think you have had a gallbladder attack. You have been diagnosed with silent gallstones and you develop pain in your abdomen or indigestion. You begin to have attacks more often. You have dark urine or light-colored stools. Get help right away if: You have pain from a gallbladder attack that lasts for more than 2 hours. You have pain in your abdomen that lasts for more than 5 hours or is getting worse. You have a fever or chills. You have nausea and vomiting that do not go away. You develop jaundice. Summary Cholelithiasis is a disease in which gallstones form in the gallbladder. This condition  may be caused by an imbalance in the different parts that make bile. This can happen if your bile has too much bilirubin or cholesterol, or does not have enough bile salts. Treatment for gallstones depends on the severity of the condition. Silent gallstones do not need treatment. If gallstones cause a gallbladder attack or other symptoms, treatment usually involves not eating or drinking anything. Treatment may also include pain medicines and antibiotics, and it sometimes includes a hospital stay. Surgery to remove the gallbladder is common if all other treatments have not worked. This information is not intended to replace advice given to you by your health care provider. Make sure you discuss any questions you have with your health care provider. Document Revised: 05/27/2019  Document Reviewed: 05/27/2019 Elsevier Patient Education  [place].

## 2021-12-29 NOTE — Progress Notes (Signed)
12/29/2021  Reason for Visit:  Abdominal pain, cholelithiasis  Requesting Provider:  Halina Maidens, MD  History of Present Illness: Taylor Johnson is a 76 y.o. female presenting for evaluation of abdominal pain and cholelithiasis.  She has been seen recently by Dr. Lysle Pearl on 12/14/21.  Her symptoms were atypical for biliary colic and he recommended HIDA scan.  She ended being admitted to the hospital on 12/16/21 for abdominal pain and she had workup including RUQ U/S, HIDA scan which showed cholelithiasis but no cholecystitis.  In the past, she's had more extensive workup with multiple CT scans noted in her chart in 11/2021, five different CT scans in 2022, three in 2021, and so on.  She had an EGD on 08/02/21 with Dr. Marius Ditch which showed some erosive gastropathy with stigmata of recent bleeding.  This was biopsied but was negative for H pylori.  She also had a colonoscopy in 11/2019 with Dr. Marius Ditch which showed some polyps and diffuse diverticulosis throughout the colon.  There has been mention about possible gastroparesis, possible pancreatic insufficiency.    The patient and her sister today report that she's been having discomfort for about 2 weeks now, which is intermittent, sometimes better and sometimes worse.  The pain more often happens at night.  Denies nausea or vomiting, but has had some spitting.  The pain is located more in the lower abdomen rather than in the right upper quadrant.  Denies any constipation and reports that recently she had diarrhea but that's resolved.  The patient does like to eat greasy/fried foods, and her sister is working on decreasing that.  Past Medical History: Past Medical History:  Diagnosis Date   Abdominal pain 01/27/2017   Anxiety    Anxiety, generalized 05/11/2015   Cough 06/02/2016   Chronic - followed by Pulmonary   Cough 06/02/2016   Chronic - followed by Pulmonary   Depression    Developmental delay    Diverticulitis    Diverticulosis of colon  05/11/2015   Dysphagia 10/15/2015   Routine Ba Swallow normal; rec modified barium swallow    GERD (gastroesophageal reflux disease)    Hematochezia 11/07/2019   Hypertension    Hypoxia 09/12/2015   Leg swelling    Localized edema 04/15/2016   Microscopic hematuria 04/15/2016   Nausea and vomiting 01/29/2017   Orthostatic hypotension 03/16/2017   OSA on CPAP 11/18/2015   CPAP @ 18 cm H2O started 02/2016   Rectal bleeding 10/15/2018   Urge incontinence of urine 05/12/2015     Past Surgical History: Past Surgical History:  Procedure Laterality Date   ABDOMINAL HYSTERECTOMY     BREAST BIOPSY Left    neg   COLON SURGERY     COLONOSCOPY WITH PROPOFOL N/A 12/13/2019   Procedure: COLONOSCOPY WITH PROPOFOL;  Surgeon: Lin Landsman, MD;  Location: [facility];  Service: Endoscopy;  Laterality: N/A;   ESOPHAGOGASTRODUODENOSCOPY (EGD) WITH PROPOFOL N/A 01/29/2017   Procedure: ESOPHAGOGASTRODUODENOSCOPY (EGD) WITH PROPOFOL;  Surgeon: Wilford Corner, MD;  Location: [facility] ENDOSCOPY;  Service: Endoscopy;  Laterality: N/A;   ESOPHAGOGASTRODUODENOSCOPY (EGD) WITH PROPOFOL N/A 08/02/2021   Procedure: ESOPHAGOGASTRODUODENOSCOPY (EGD) WITH PROPOFOL;  Surgeon: Lin Landsman, MD;  Location: [facility] ENDOSCOPY;  Service: Gastroenterology;  Laterality: N/A;    Home Medications: Prior to Admission medications   Medication Sig Start Date End Date Taking? Authorizing Provider  erythromycin base (E-MYCIN) 500 MG tablet Take 1 tablet (500 mg total) by mouth 3 (three) times daily for 8 days. 12/23/21 12/31/21 Yes Amery,  Sahar, MD  hydrALAZINE (APRESOLINE) 25 MG tablet TAKE ONE TABLET BY MOUTH THREE TIMES DAILY 07/30/21  Yes [name], MD  meclizine (ANTIVERT) 12.5 MG tablet TAKE ONE TABLET BY MOUTH THREE TIMES DAILY AS NEEDED FOR DIZZINESS 08/31/21  Yes [name], MD  metoCLOPramide (REGLAN) 5 MG tablet TAKE ONE TABLET BY MOUTH EVERY MORNING, NOON, EVENING 10/13/21  Yes [name],  MD  OLANZapine (ZYPREXA) 2.5 MG tablet Take 1 tablet (2.5 mg total) by mouth at bedtime. 12/23/21 01/22/22 Yes Nolberto Hanlon, MD  ondansetron (ZOFRAN-ODT) 4 MG disintegrating tablet Take 1 tablet (4 mg total) by mouth every 8 (eight) hours as needed for nausea or vomiting. 12/10/21  Yes Paulette Blanch, MD  pantoprazole (PROTONIX) 40 MG tablet Take 1 tablet (40 mg total) by mouth 2 (two) times daily. 12/23/21 02/21/22 Yes Nolberto Hanlon, MD  Saccharomyces boulardii (PROBIOTIC) 250 MG CAPS Take 1 capsule by mouth in the morning and at bedtime. 11/20/21  Yes Merlyn Lot, MD  senna-docusate (SENOKOT-S) 8.6-50 MG tablet Take 1 tablet by mouth at bedtime as needed for up to 10 days for mild constipation. 12/23/21 01/02/22 Yes Nolberto Hanlon, MD    Allergies: No Known Allergies  Social History:  reports that she has never smoked. She has never used smokeless tobacco. She reports that she does not drink alcohol and does not use drugs.   Family History: Family History  Problem Relation Age of Onset   Hypertension Mother    Kidney disease Mother    Dementia Mother    Cancer Father        lung   Bladder Cancer Neg Hx    Breast cancer Neg Hx     Review of Systems: Review of Systems  Constitutional:  Negative for chills and fever.  Respiratory:  Negative for shortness of breath.   Cardiovascular:  Negative for chest pain.  Gastrointestinal:  Positive for abdominal pain and diarrhea. Negative for constipation, nausea and vomiting.  Skin:  Negative for rash.    Physical Exam BP 112/72   Pulse 76   Temp 98.8 F (37.1 C) (Oral)   Ht '5\' 4"'$  (1.626 m)   Wt 223 lb 9.6 oz (101.4 kg)   SpO2 98%   BMI 38.38 kg/m  CONSTITUTIONAL: No acute distress HEENT:  Normocephalic, atraumatic, extraocular motion intact. RESPIRATORY:  Lungs are clear, and breath sounds are equal bilaterally. Normal respiratory effort without pathologic use of accessory muscles. CARDIOVASCULAR: Heart is regular without murmurs, gallops,  or rubs. GI: The abdomen is soft, non-distended, currently non-tender.  She points to the right lower abdomen and lower mid abdomen in the locations of prior discomfort.  SKIN: Skin turgor is normal. There are no pathologic skin lesions.  NEUROLOGIC:  Motor and sensation is grossly normal.  Cranial nerves are grossly intact. PSYCH:  Alert and oriented to person, place and time. Affect is normal.  Laboratory Analysis: Labs from 12/22/21: Na 135, K 3.2, Cl 91, CO2 36, BUN 17, Cr 0.99.  WBC 7.2, Hgb 12.8, Hct 41.5, Plt 194.  Imaging: HIDA scan 12/17/21: IMPRESSION: Cystic duct is patent, however delayed gallbladder filling is seen following morphine administration, likely due to known cholelithiasis.   No evidence of biliary obstruction.  U/S RUQ 12/16/21: IMPRESSION: 1. Cholelithiasis without sonographic findings of acute cholecystitis. 2. Increased liver echogenicity of steatosis. 3. No biliary dilatation. 4. Mild increased right renal cortical echogenicity consistent with medical renal disease.  MRCP 12/03/21: IMPRESSION: 1. Single gallstone near the  gallbladder neck measuring 1.0 cm. No gallbladder wall thickening or pericholecystic fluid. 2. Mild intrahepatic biliary ductal dilatation is unchanged in comparison to examinations dating back to 11/04/2019 and presumably of no clinical significance. The central common bile duct is patent and nondilated without obstructing calculus visualized. The possibility of a central biliary ductal stricture or other abnormality may be further interrogated by ERCP if there is high clinical suspicion for biliary obstruction based on cholestasis or other evidence. 3. Severe pancolonic diverticulosis.  CT abdomen/pelvis 11/20/21: IMPRESSION: No acute findings. Stable uterine fibroids, largest measuring 7 cm. Colonic diverticulosis, without radiographic evidence of diverticulitis. Cholelithiasis. No radiographic evidence of cholecystitis. Stable  small pericardial effusion.   Assessment and Plan: This is a 76 y.o. female with abdominal pain.  --Discussed with the patient and her sister that the symptoms she is experiencing are not typical for biliary colic.  Although she does eat greasy foods, her pain is not necessarily aggravated after eating, and the location of the pain is in the lower abdomen.  Discussed with them the potential options of conservative management, starting by trial of a low fat diet to see if her symptoms improve.  The other option would be to proceed with surgery in the form of robotic assisted cholecystectomy, but despite of the extensive workup, I would not be able to guarantee that her symptoms would resolve after surgery given the atypical characteristics.  She has opted for starting with low fat diet first.  The patient's sister was also wondering if a second opinion would be helpful or if all the testing has been exhausted.  I discussed with her that if she wants another opinion, we can do a referral, likely to GI for the workup of her pain.  She will think about this and call us if she is interested. --Discussed option for follow up with me in about a month to reassess but she prefers to follow up as needed.  I spent 40 minutes dedicated to the care of this patient on the date of this encounter to include pre-visit review of records, face-to-face time with the patient discussing diagnosis and management, and any post-visit coordination of care.   Melvyn Neth, [facility]

## 2021-12-30 DIAGNOSIS — I10 Essential (primary) hypertension: Secondary | ICD-10-CM | POA: Diagnosis not present

## 2021-12-30 DIAGNOSIS — K3184 Gastroparesis: Secondary | ICD-10-CM | POA: Diagnosis not present

## 2021-12-30 DIAGNOSIS — R1310 Dysphagia, unspecified: Secondary | ICD-10-CM | POA: Diagnosis not present

## 2021-12-30 DIAGNOSIS — K8020 Calculus of gallbladder without cholecystitis without obstruction: Secondary | ICD-10-CM | POA: Diagnosis not present

## 2021-12-30 DIAGNOSIS — E538 Deficiency of other specified B group vitamins: Secondary | ICD-10-CM | POA: Diagnosis not present

## 2021-12-30 DIAGNOSIS — G4733 Obstructive sleep apnea (adult) (pediatric): Secondary | ICD-10-CM | POA: Diagnosis not present

## 2021-12-30 DIAGNOSIS — K8689 Other specified diseases of pancreas: Secondary | ICD-10-CM | POA: Diagnosis not present

## 2021-12-30 DIAGNOSIS — G8929 Other chronic pain: Secondary | ICD-10-CM | POA: Diagnosis not present

## 2021-12-30 DIAGNOSIS — K219 Gastro-esophageal reflux disease without esophagitis: Secondary | ICD-10-CM | POA: Diagnosis not present

## 2021-12-30 DIAGNOSIS — J45909 Unspecified asthma, uncomplicated: Secondary | ICD-10-CM | POA: Diagnosis not present

## 2021-12-30 DIAGNOSIS — H8111 Benign paroxysmal vertigo, right ear: Secondary | ICD-10-CM | POA: Diagnosis not present

## 2021-12-30 DIAGNOSIS — I951 Orthostatic hypotension: Secondary | ICD-10-CM | POA: Diagnosis not present

## 2021-12-30 DIAGNOSIS — K5730 Diverticulosis of large intestine without perforation or abscess without bleeding: Secondary | ICD-10-CM | POA: Diagnosis not present

## 2021-12-30 DIAGNOSIS — Z9181 History of falling: Secondary | ICD-10-CM | POA: Diagnosis not present

## 2021-12-30 DIAGNOSIS — R6250 Unspecified lack of expected normal physiological development in childhood: Secondary | ICD-10-CM | POA: Diagnosis not present

## 2021-12-30 LAB — CBC WITH DIFFERENTIAL/PLATELET
Basophils Absolute: 0 10*3/uL (ref 0.0–0.2)
Basos: 0 %
EOS (ABSOLUTE): 0.3 10*3/uL (ref 0.0–0.4)
Eos: 4 %
Hematocrit: 37.7 % (ref 34.0–46.6)
Hemoglobin: 11.7 g/dL (ref 11.1–15.9)
Immature Grans (Abs): 0 10*3/uL (ref 0.0–0.1)
Immature Granulocytes: 0 %
Lymphocytes Absolute: 1.9 10*3/uL (ref 0.7–3.1)
Lymphs: 28 %
MCH: 27.6 pg (ref 26.6–33.0)
MCHC: 31 g/dL — ABNORMAL LOW (ref 31.5–35.7)
MCV: 89 fL (ref 79–97)
Monocytes Absolute: 0.9 10*3/uL (ref 0.1–0.9)
Monocytes: 14 %
Neutrophils Absolute: 3.6 10*3/uL (ref 1.4–7.0)
Neutrophils: 54 %
Platelets: 260 10*3/uL (ref 150–450)
RBC: 4.24 x10E6/uL (ref 3.77–5.28)
RDW: 15.8 % — ABNORMAL HIGH (ref 11.7–15.4)
WBC: 6.8 10*3/uL (ref 3.4–10.8)

## 2021-12-30 LAB — BASIC METABOLIC PANEL
BUN/Creatinine Ratio: 17 (ref 12–28)
BUN: 15 mg/dL (ref 8–27)
CO2: 28 mmol/L (ref 20–29)
Calcium: 9.8 mg/dL (ref 8.7–10.3)
Chloride: 100 mmol/L (ref 96–106)
Creatinine, Ser: 0.89 mg/dL (ref 0.57–1.00)
Glucose: 96 mg/dL (ref 70–99)
Potassium: 4.6 mmol/L (ref 3.5–5.2)
Sodium: 140 mmol/L (ref 134–144)
eGFR: 68 mL/min/{1.73_m2} (ref >59)

## 2021-12-31 ENCOUNTER — Encounter: Payer: Self-pay | Admitting: Intensive Care

## 2021-12-31 ENCOUNTER — Emergency Department
Admission: EM | Admit: 2021-12-31 | Discharge: 2021-12-31 | Disposition: A | Discharge status: Home / Self Care | Payer: Medicare Other | Attending: Emergency Medicine | Admitting: Emergency Medicine

## 2021-12-31 ENCOUNTER — Emergency Department: Payer: Medicare Other

## 2021-12-31 ENCOUNTER — Other Ambulatory Visit: Payer: Self-pay

## 2021-12-31 DIAGNOSIS — G8929 Other chronic pain: Secondary | ICD-10-CM | POA: Diagnosis not present

## 2021-12-31 DIAGNOSIS — I10 Essential (primary) hypertension: Secondary | ICD-10-CM | POA: Diagnosis not present

## 2021-12-31 DIAGNOSIS — Z9181 History of falling: Secondary | ICD-10-CM | POA: Diagnosis not present

## 2021-12-31 DIAGNOSIS — R1030 Lower abdominal pain, unspecified: Secondary | ICD-10-CM | POA: Diagnosis not present

## 2021-12-31 DIAGNOSIS — H8111 Benign paroxysmal vertigo, right ear: Secondary | ICD-10-CM | POA: Diagnosis not present

## 2021-12-31 DIAGNOSIS — R1010 Upper abdominal pain, unspecified: Secondary | ICD-10-CM | POA: Diagnosis not present

## 2021-12-31 DIAGNOSIS — K219 Gastro-esophageal reflux disease without esophagitis: Secondary | ICD-10-CM | POA: Diagnosis not present

## 2021-12-31 DIAGNOSIS — R6250 Unspecified lack of expected normal physiological development in childhood: Secondary | ICD-10-CM | POA: Diagnosis not present

## 2021-12-31 DIAGNOSIS — I951 Orthostatic hypotension: Secondary | ICD-10-CM | POA: Diagnosis not present

## 2021-12-31 DIAGNOSIS — R1310 Dysphagia, unspecified: Secondary | ICD-10-CM | POA: Diagnosis not present

## 2021-12-31 DIAGNOSIS — K8689 Other specified diseases of pancreas: Secondary | ICD-10-CM | POA: Diagnosis not present

## 2021-12-31 DIAGNOSIS — K3184 Gastroparesis: Secondary | ICD-10-CM | POA: Diagnosis not present

## 2021-12-31 DIAGNOSIS — R109 Unspecified abdominal pain: Secondary | ICD-10-CM | POA: Insufficient documentation

## 2021-12-31 DIAGNOSIS — K5730 Diverticulosis of large intestine without perforation or abscess without bleeding: Secondary | ICD-10-CM | POA: Diagnosis not present

## 2021-12-31 DIAGNOSIS — J45909 Unspecified asthma, uncomplicated: Secondary | ICD-10-CM | POA: Diagnosis not present

## 2021-12-31 DIAGNOSIS — G4733 Obstructive sleep apnea (adult) (pediatric): Secondary | ICD-10-CM | POA: Diagnosis not present

## 2021-12-31 DIAGNOSIS — Z743 Need for continuous supervision: Secondary | ICD-10-CM | POA: Diagnosis not present

## 2021-12-31 DIAGNOSIS — K8020 Calculus of gallbladder without cholecystitis without obstruction: Secondary | ICD-10-CM | POA: Diagnosis not present

## 2021-12-31 DIAGNOSIS — R6889 Other general symptoms and signs: Secondary | ICD-10-CM | POA: Diagnosis not present

## 2021-12-31 DIAGNOSIS — E538 Deficiency of other specified B group vitamins: Secondary | ICD-10-CM | POA: Diagnosis not present

## 2021-12-31 LAB — URINALYSIS, ROUTINE W REFLEX MICROSCOPIC
Bilirubin Urine: NEGATIVE
Glucose, UA: NEGATIVE mg/dL
Hgb urine dipstick: NEGATIVE
Ketones, ur: NEGATIVE mg/dL
Leukocytes,Ua: NEGATIVE
Nitrite: NEGATIVE
Protein, ur: NEGATIVE mg/dL
Specific Gravity, Urine: 1.002 — ABNORMAL LOW (ref 1.005–1.030)
pH: 7 (ref 5.0–8.0)

## 2021-12-31 LAB — COMPREHENSIVE METABOLIC PANEL
ALT: 19 U/L (ref 0–44)
AST: 23 U/L (ref 15–41)
Albumin: 3.6 g/dL (ref 3.5–5.0)
Alkaline Phosphatase: 64 U/L (ref 38–126)
Anion gap: 8 (ref 5–15)
BUN: 26 mg/dL — ABNORMAL HIGH (ref 8–23)
CO2: 28 mmol/L (ref 22–32)
Calcium: 9.4 mg/dL (ref 8.9–10.3)
Chloride: 101 mmol/L (ref 98–111)
Creatinine, Ser: 0.98 mg/dL (ref 0.44–1.00)
GFR, Estimated: >60 mL/min (ref >60)
Glucose, Bld: 119 mg/dL — ABNORMAL HIGH (ref 70–99)
Potassium: 4.7 mmol/L (ref 3.5–5.1)
Sodium: 137 mmol/L (ref 135–145)
Total Bilirubin: 0.6 mg/dL (ref 0.3–1.2)
Total Protein: 7.4 g/dL (ref 6.5–8.1)

## 2021-12-31 LAB — CBC
HCT: 38.3 % (ref 36.0–46.0)
Hemoglobin: 11.5 g/dL — ABNORMAL LOW (ref 12.0–15.0)
MCH: 27.5 pg (ref 26.0–34.0)
MCHC: 30 g/dL (ref 30.0–36.0)
MCV: 91.6 fL (ref 80.0–100.0)
Platelets: 278 10*3/uL (ref 150–400)
RBC: 4.18 MIL/uL (ref 3.87–5.11)
RDW: 18.2 % — ABNORMAL HIGH (ref 11.5–15.5)
WBC: 8.4 10*3/uL (ref 4.0–10.5)
nRBC: 0 % (ref 0.0–0.2)

## 2021-12-31 LAB — LIPASE, BLOOD: Lipase: 22 U/L (ref 11–51)

## 2021-12-31 MED ORDER — SUCRALFATE 1 G PO TABS
1.0000 g | ORAL_TABLET | Freq: Once | ORAL | Status: AC
  Administered 2021-12-31: 1 g via ORAL
  Filled 2021-12-31: qty 1

## 2021-12-31 MED ORDER — ALUM & MAG HYDROXIDE-SIMETH 200-200-20 MG/5ML PO SUSP
30.0000 mL | Freq: Once | ORAL | Status: AC
  Administered 2021-12-31: 30 mL via ORAL
  Filled 2021-12-31: qty 30

## 2021-12-31 MED ORDER — IOHEXOL 300 MG/ML  SOLN
100.0000 mL | Freq: Once | INTRAMUSCULAR | Status: AC | PRN
  Administered 2021-12-31: 100 mL via INTRAVENOUS

## 2021-12-31 NOTE — ED Provider Notes (Signed)
[facility] Provider Note    Event Date/Time   First MD Initiated Contact with Patient 12/31/21 1552     (approximate)   History   Abdominal Pain   HPI  Taylor Johnson is a 76 y.o. female with a past medical history of known gallstones, OSA, HTN, GERD, anxiety, depression, developmental delay, and chronic abdominal pain and recent evaluation with gallbladder including a HIDA scan and recent follow-up visit with general surgery with plan for possible cholecystectomy if symptoms do not improve with a low-fat diet who presents accompanied by family for evaluation of some ongoing abdominal discomfort.  Patient is a somewhat poor historian frequently looking to family member at bedside when asked direct questions.  She states she has a little bit of abdominal pain.  She denies vomiting or diarrhea and states she had "a very big.  This morning".  She denies any urinary symptoms, chest pain, cough, shortness of breath or any other symptoms.  She does not think her pain is any different today than it has been over the last couple months.  Family at bedside said they were told by home health stating when to check on patient that she had some discomfort comfort this morning.  He subsequently had some food but does not seem to help much and the home health aide recommended patient be evaluated emergency room.  No recent alcohol or NSAIDs.  No recent trauma or injuries.    Past Medical History:  Diagnosis Date   Abdominal pain 01/27/2017   Anxiety    Anxiety, generalized 05/11/2015   Cough 06/02/2016   Chronic - followed by Pulmonary   Cough 06/02/2016   Chronic - followed by Pulmonary   Depression    Developmental delay    Diverticulitis    Diverticulosis of colon 05/11/2015   Dysphagia 10/15/2015   Routine Ba Swallow normal; rec modified barium swallow    GERD (gastroesophageal reflux disease)    Hematochezia 11/07/2019   Hypertension    Hypoxia 09/12/2015   Leg  swelling    Localized edema 04/15/2016   Microscopic hematuria 04/15/2016   Nausea and vomiting 01/29/2017   Orthostatic hypotension 03/16/2017   OSA on CPAP 11/18/2015   CPAP @ 18 cm H2O started 02/2016   Rectal bleeding 10/15/2018   Urge incontinence of urine 05/12/2015     Physical Exam  Triage Vital Signs: ED Triage Vitals  Enc Vitals Group     BP 12/31/21 1528 (!) 134/104     Pulse Rate 12/31/21 1528 100     Resp 12/31/21 1528 18     Temp 12/31/21 1528 98.6 F (37 C)     Temp Source 12/31/21 1528 Oral     SpO2 12/31/21 1528 97 %     Weight 12/31/21 1530 226 lb (102.5 kg)     Height 12/31/21 1530 '5\' 4"'$  (1.626 m)     Head Circumference --      Peak Flow --      Pain Score --      Pain Loc --      Pain Edu? --      Excl. in [place]? --     Most recent vital signs: Vitals:   12/31/21 1528  BP: (!) 134/104  Pulse: 100  Resp: 18  Temp: 98.6 F (37 C)  SpO2: 97%    General: Awake, no distress.  CV:  Good peripheral perfusion.  2+ radial pulses. Resp:  Normal effort.  Clear bilaterally. Abd:  No distention.  Soft throughout with no CVA tenderness.   ED Results / Procedures / Treatments  Labs (all labs ordered are listed, but only abnormal results are displayed) Labs Reviewed  COMPREHENSIVE METABOLIC PANEL - Abnormal; Notable for the following components:      Result Value   Glucose, Bld 119 (*)    BUN 26 (*)    All other components within normal limits  CBC - Abnormal; Notable for the following components:   Hemoglobin 11.5 (*)    RDW 18.2 (*)    All other components within normal limits  URINALYSIS, ROUTINE W REFLEX MICROSCOPIC - Abnormal; Notable for the following components:   Color, Urine COLORLESS (*)    APPearance CLEAR (*)    Specific Gravity, Urine 1.002 (*)    All other components within normal limits  LIPASE, BLOOD     EKG  EKG is remarkable for sinus rhythm with a ventricular rate of 73, normal axis, unremarkable intervals with ST depression  and T wave inversion in V5 and V6 without any other clear evidence of acute ischemia or significant arrhythmia.  These changes are compared to ECG from 6/5 that showed essentially identical changes without any new changes noted on today's ECG.  RADIOLOGY  CT abdomen pelvis my interpretation without evidence of AAA, diverticulitis, cholecystitis, pancreatitis, kidney stone or other clear acute abdominal or pelvic process.  I reviewed radiology's interpretation and agree with their findings of diverticulosis without diverticulitis and cholelithiasis without cholecystitis without any other clear acute abdominal or pelvic process.  PROCEDURES:  Critical Care performed: No  Procedures    MEDICATIONS ORDERED IN ED: Medications  alum & mag hydroxide-simeth (MAALOX/MYLANTA) 200-200-20 MG/5ML suspension 30 mL (30 mLs Oral Given 12/31/21 1636)  sucralfate (CARAFATE) tablet 1 g (1 g Oral Given 12/31/21 1637)  iohexol (OMNIPAQUE) 300 MG/ML solution 100 mL (100 mLs Intravenous Contrast Given 12/31/21 1714)     IMPRESSION / MDM / ASSESSMENT AND PLAN / ED COURSE  I reviewed the triage vital signs and the nursing notes. Patient's presentation is most consistent with acute presentation with potential threat to life or bodily function.                               Differential diagnosis includes, but is not limited to peptic ulcer disease, constipation, symptomatic lithiasis, diverticulitis and cystitis.  I reviewed some additional prior records and it seems that since 2021 patient has had 6 abdominal and pelvis CTs as well as right upper quadrant ultrasound and MRCP which was most recently performed on 5/19 that showed a single gallstone near the gallbladder neck without evidence of cholecystitis and some mild intrahepatic biliary ductal dilation unchanged when compared to the most recent MRCP from 21 without other clear process there is some notation of severe pancolonic diverticulosis.  EKG is  remarkable for sinus rhythm with a ventricular rate of 73, normal axis, unremarkable intervals with ST depression and T wave inversion in V5 and V6 without any other clear evidence of acute ischemia or significant arrhythmia.  These changes are compared to ECG from 6/5 that showed essentially identical changes without any new changes noted on today's ECG.  CT abdomen pelvis my interpretation without evidence of AAA, diverticulitis, cholecystitis, pancreatitis, kidney stone or other clear acute abdominal or pelvic process.  I reviewed radiology's interpretation and agree with their findings of diverticulosis without diverticulitis and cholelithiasis without cholecystitis without any other clear acute  abdominal or pelvic process.  Lipase WNL not suggestive of pancreatitis.  CMP shows no significant electrolyte or metabolic derangements.  No evidence of hepatitis or acute cholestatic process.  CBC without leukocytosis or acute anemia.  Hemoglobin today is stable at 11.5 compared to 11.7 2 days ago.  UA shows no evidence of hemoglobin ketones blood or bilirubin.  On reassessment patient is feeling little better.  She is able to tolerate p.o. without difficulty.  Unclear the etiology for the symptoms that have provided patient for several weeks at this point given stable vitals and reassuring exam and work-up and patient tolerating p.o. I have a low suspicion for immediate life-threatening process I think she is appropriate for continued outpatient evaluation.  Discharged in stable condition.  Strict return precautions advised and discussed.       FINAL CLINICAL IMPRESSION(S) / ED DIAGNOSES   Final diagnoses:  Abdominal pain, unspecified abdominal location     Rx / DC Orders   ED Discharge Orders     None        Note:  This document was prepared using Dragon voice recognition software and may include unintentional dictation errors.   Lucrezia Starch, MD 12/31/21 1758

## 2021-12-31 NOTE — ED Triage Notes (Signed)
Patient c/o abdominal pain for the past month. Seen for same recently. Relayed by family she had a gallstone that was too small to remove.

## 2021-12-31 NOTE — ED Triage Notes (Signed)
First Nurse Note;  Pt via EMS from home. Pt c/o mid-upper abd pain for the past month. Pt was recently seen here for the same, pt does have a gallstone that was too small to remove. Denies NVD.  Pt is A&OX4 and NAD 100 NSR  99% on RA 171/81 BP

## 2022-01-03 ENCOUNTER — Telehealth: Payer: Self-pay

## 2022-01-03 ENCOUNTER — Ambulatory Visit: Payer: Medicare Other | Admitting: Internal Medicine

## 2022-01-03 ENCOUNTER — Other Ambulatory Visit: Payer: Medicare Other

## 2022-01-03 ENCOUNTER — Encounter: Payer: Self-pay | Admitting: Gastroenterology

## 2022-01-03 ENCOUNTER — Ambulatory Visit (INDEPENDENT_AMBULATORY_CARE_PROVIDER_SITE_OTHER): Payer: Medicare Other | Admitting: Gastroenterology

## 2022-01-03 VITALS — BP 168/76 | HR 81 | Temp 98.7°F | Ht 64.0 in | Wt 223.0 lb

## 2022-01-03 DIAGNOSIS — R1030 Lower abdominal pain, unspecified: Secondary | ICD-10-CM | POA: Diagnosis not present

## 2022-01-03 DIAGNOSIS — K5909 Other constipation: Secondary | ICD-10-CM

## 2022-01-03 NOTE — Progress Notes (Signed)
[name], MD [address]  [place]  [place], [place] [address]  Main: [phone]  Fax: [phone]    Gastroenterology Consultation  Referring Provider:     [name], MD Primary Care Physician:  [name], MD Primary Gastroenterologist:  Dr. [name] Reason for Consultation: Lower abdominal pain        HPI:   Taylor Johnson is a 76 y.o. female referred by Dr. Army Melia, Jesse Sans, MD  for consultation & management of lower abdominal pain.  Patient went to the ER on 6/16 secondary to abdominal pain, underwent CT abdomen pelvis with contrast which revealed severe pancolonic diverticulosis, cholelithiasis.  Labs including CBC, CMP, serum lipase and urinalysis revealed mild elevated BUN only.  Patient came to clinic today with her 2 sisters.  Patient points to worst the pain in the supra pubic area, below the umbilicus.  According to patient's sisters, she spends more than 15 to 20 minutes in the toilet whenever she has a bowel movement and she does not have bowel movements every day.  Patient does not drink water at all.  Patient drink carbonated beverages, soft drinks, lemonade and fruit slushes.  She is on Senokot as needed.  Patient reports bubbling in her stomach.  Patient has undergone extensive work-up which has been unrevealing.  NSAIDs: None  Antiplts/Anticoagulants/Anti thrombotics: None  GI Procedures:  EGD 01/2017 normal. Colonoscopy 04/2014 showed diverticulosis. Complete report not available and patient reports that it was done at [facility]   Colonoscopy 12/13/2019 - Hemorrhoids found on perianal exam. - One 10 mm polyp in the ascending colon, removed with a hot snare. Resected and retrieved. - Three 3 to 6 mm polyps in the descending colon and in the ascending colon, removed with a cold snare. Resected and retrieved. - Diverticulosis in the entire examined colon. - Non-bleeding internal hemorrhoids.   DIAGNOSIS:  A. COLON  POLYP X3, ASCENDING; BIOPSY:  - 2 FRAGMENTS OF TUBULAR ADENOMA AND A FRAGMENT OF BENIGN COLONIC  MUCOSA.  - NEGATIVE FOR HIGH-GRADE DYSPLASIA AND MALIGNANCY.   B. COLON POLYP, DESCENDING; BIOPSY:  - FRAGMENTS OF TUBULAR ADENOMA.  - NEGATIVE FOR HIGH-GRADE DYSPLASIA AND MALIGNANCY.   Past Medical History:  Diagnosis Date   Abdominal pain 01/27/2017   Anxiety    Anxiety, generalized 05/11/2015   Cough 06/02/2016   Chronic - followed by Pulmonary   Cough 06/02/2016   Chronic - followed by Pulmonary   Depression    Developmental delay    Diverticulitis    Diverticulosis of colon 05/11/2015   Dysphagia 10/15/2015   Routine Ba Swallow normal; rec modified barium swallow    GERD (gastroesophageal reflux disease)    Hematochezia 11/07/2019   Hypertension    Hypoxia 09/12/2015   Leg swelling    Localized edema 04/15/2016   Microscopic hematuria 04/15/2016   Nausea and vomiting 01/29/2017   Orthostatic hypotension 03/16/2017   OSA on CPAP 11/18/2015   CPAP @ 18 cm H2O started 02/2016   Rectal bleeding 10/15/2018   Urge incontinence of urine 05/12/2015    Past Surgical History:  Procedure Laterality Date   ABDOMINAL HYSTERECTOMY     BREAST BIOPSY Left    neg   COLON SURGERY     COLONOSCOPY WITH PROPOFOL N/A 12/13/2019   Procedure: COLONOSCOPY WITH PROPOFOL;  Surgeon: Lin Landsman, MD;  Location: [facility];  Service: Endoscopy;  Laterality: N/A;   ESOPHAGOGASTRODUODENOSCOPY (EGD) WITH PROPOFOL N/A 01/29/2017  Procedure: ESOPHAGOGASTRODUODENOSCOPY (EGD) WITH PROPOFOL;  Surgeon: Wilford Corner, MD;  Location: [facility] ENDOSCOPY;  Service: Endoscopy;  Laterality: N/A;   ESOPHAGOGASTRODUODENOSCOPY (EGD) WITH PROPOFOL N/A 08/02/2021   Procedure: ESOPHAGOGASTRODUODENOSCOPY (EGD) WITH PROPOFOL;  Surgeon: Lin Landsman, MD;  Location: [facility] ENDOSCOPY;  Service: Gastroenterology;  Laterality: N/A;     Current Outpatient Medications:    hydrALAZINE (APRESOLINE) 25 MG  tablet, TAKE ONE TABLET BY MOUTH THREE TIMES DAILY, Disp: 90 tablet, Rfl: 9   meclizine (ANTIVERT) 12.5 MG tablet, TAKE ONE TABLET BY MOUTH THREE TIMES DAILY AS NEEDED FOR DIZZINESS, Disp: 30 tablet, Rfl: 2   OLANZapine (ZYPREXA) 2.5 MG tablet, Take 1 tablet (2.5 mg total) by mouth at bedtime., Disp: 30 tablet, Rfl: 0   ondansetron (ZOFRAN-ODT) 4 MG disintegrating tablet, Take 1 tablet (4 mg total) by mouth every 8 (eight) hours as needed for nausea or vomiting., Disp: 20 tablet, Rfl: 0   pantoprazole (PROTONIX) 40 MG tablet, Take 1 tablet (40 mg total) by mouth 2 (two) times daily., Disp: 60 tablet, Rfl: 1   Saccharomyces boulardii (PROBIOTIC) 250 MG CAPS, Take 1 capsule by mouth in the morning and at bedtime., Disp: 30 capsule, Rfl: 0   metoCLOPramide (REGLAN) 5 MG tablet, TAKE ONE TABLET BY MOUTH EVERY MORNING, NOON, EVENING (Patient not taking: Reported on 01/03/2022), Disp: 90 tablet, Rfl: 2   Family History  Problem Relation Age of Onset   Hypertension Mother    Kidney disease Mother    Dementia Mother    Cancer Father        lung   Bladder Cancer Neg Hx    Breast cancer Neg Hx      Social History   Tobacco Use   Smoking status: Never   Smokeless tobacco: Never   Tobacco comments:    smoking cessation materials not required  Vaping Use   Vaping Use: Never used  Substance Use Topics   Alcohol use: No    Alcohol/week: 0.0 standard drinks of alcohol   Drug use: No    Allergies as of 01/03/2022   (No Known Allergies)    Review of Systems:    All systems reviewed and negative except where noted in HPI.   Physical Exam:  BP (!) 168/76 (BP Location: Left Arm, Patient Position: Sitting, Cuff Size: Normal)   Pulse 81   Temp 98.7 F (37.1 C) (Oral)   Ht '5\' 4"'$  (1.626 m)   Wt 223 lb (101.2 kg)   BMI 38.28 kg/m  No LMP recorded. Patient has had a hysterectomy.  General:   Alert,  Well-developed, well-nourished, pleasant and cooperative in NAD Head:  Normocephalic and  atraumatic. Eyes:  Sclera clear, no icterus.   Conjunctiva pink. Ears:  Normal auditory acuity. Nose:  No deformity, discharge, or lesions. Mouth:  No deformity or lesions,oropharynx pink & moist. Neck:  Supple; no masses or thyromegaly. Lungs:  Respirations even and unlabored.  Clear throughout to auscultation.   No wheezes, crackles, or rhonchi. No acute distress. Heart:  Regular rate and rhythm; no murmurs, clicks, rubs, or gallops. Abdomen:  Normal bowel sounds. Soft, non-tender and non-distended without masses, hepatosplenomegaly or hernias noted.  No guarding or rebound tenderness.   Rectal: Not performed Msk:  Symmetrical without gross deformities. Good, equal movement & strength bilaterally. Pulses:  Normal pulses noted. Extremities:  No clubbing or edema.  No cyanosis. Neurologic:  Alert and oriented x3;  grossly normal neurologically. Skin:  Intact without significant lesions or rashes. No jaundice. Psych:  Alert and cooperative. Normal mood and affect.  Imaging Studies: Reviewed  Assessment and Plan:   Reighlyn Elmes Ostrow is a 76 y.o. African-American female with mild cognitive impairment, severe pancolonic diverticulosis, tubular adenomas of the colon is seen in consultation for lower abdominal pain  Lower abdominal pain secondary to constipation and consumption of sugary drinks daily Work-up has been unrevealing Patient does not drink water Advised patient to stop consuming sugary drinks daily Start MiraLAX 17 to 34 g daily Advised to patient's sisters that frequent ER visits will not help her, rather pay attention to her pain pattern and make sure she is avoiding all junk foods and having regular bowel movements.  Follow up as needed  [name], MD

## 2022-01-03 NOTE — Telephone Encounter (Signed)
Patient was scheduled for today and tomorrow for stomach pains. Called patients sister per Dr Army Melia and told her Dr Army Melia said unfortunately there is nothing she can do for her abdominal pain. She will need to call the GI doctor or General Surgery since they are the ones managing this.  She verbalized understanding. Canceled both appts.  - Tien Aispuro

## 2022-01-04 ENCOUNTER — Ambulatory Visit: Payer: Medicare Other | Admitting: Internal Medicine

## 2022-01-05 ENCOUNTER — Other Ambulatory Visit: Payer: Self-pay | Admitting: Internal Medicine

## 2022-01-05 DIAGNOSIS — G8929 Other chronic pain: Secondary | ICD-10-CM | POA: Diagnosis not present

## 2022-01-05 DIAGNOSIS — I10 Essential (primary) hypertension: Secondary | ICD-10-CM | POA: Diagnosis not present

## 2022-01-05 DIAGNOSIS — R6250 Unspecified lack of expected normal physiological development in childhood: Secondary | ICD-10-CM | POA: Diagnosis not present

## 2022-01-05 DIAGNOSIS — G4733 Obstructive sleep apnea (adult) (pediatric): Secondary | ICD-10-CM | POA: Diagnosis not present

## 2022-01-05 DIAGNOSIS — K5730 Diverticulosis of large intestine without perforation or abscess without bleeding: Secondary | ICD-10-CM | POA: Diagnosis not present

## 2022-01-05 DIAGNOSIS — R030 Elevated blood-pressure reading, without diagnosis of hypertension: Secondary | ICD-10-CM

## 2022-01-05 DIAGNOSIS — E538 Deficiency of other specified B group vitamins: Secondary | ICD-10-CM | POA: Diagnosis not present

## 2022-01-05 DIAGNOSIS — K8689 Other specified diseases of pancreas: Secondary | ICD-10-CM | POA: Diagnosis not present

## 2022-01-05 DIAGNOSIS — K219 Gastro-esophageal reflux disease without esophagitis: Secondary | ICD-10-CM | POA: Diagnosis not present

## 2022-01-05 DIAGNOSIS — Z9181 History of falling: Secondary | ICD-10-CM | POA: Diagnosis not present

## 2022-01-05 DIAGNOSIS — K2970 Gastritis, unspecified, without bleeding: Secondary | ICD-10-CM

## 2022-01-05 DIAGNOSIS — K8020 Calculus of gallbladder without cholecystitis without obstruction: Secondary | ICD-10-CM | POA: Diagnosis not present

## 2022-01-05 DIAGNOSIS — J45909 Unspecified asthma, uncomplicated: Secondary | ICD-10-CM | POA: Diagnosis not present

## 2022-01-05 DIAGNOSIS — K3184 Gastroparesis: Secondary | ICD-10-CM | POA: Diagnosis not present

## 2022-01-05 DIAGNOSIS — H8111 Benign paroxysmal vertigo, right ear: Secondary | ICD-10-CM | POA: Diagnosis not present

## 2022-01-05 DIAGNOSIS — I951 Orthostatic hypotension: Secondary | ICD-10-CM | POA: Diagnosis not present

## 2022-01-05 DIAGNOSIS — R1310 Dysphagia, unspecified: Secondary | ICD-10-CM | POA: Diagnosis not present

## 2022-01-05 MED ORDER — MECLIZINE HCL 12.5 MG PO TABS: ORAL_TABLET | ORAL | 0 refills | Status: DC

## 2022-01-05 NOTE — Telephone Encounter (Signed)
Notes to clinic:  Not a provider in our practice.      Requested Prescriptions  Pending Prescriptions Disp Refills   metoCLOPramide (REGLAN) 5 MG tablet [Pharmacy Med Name: metoclopramide 5 mg tablet] 270 tablet 0    Sig: NEW PRESCRIPTION REQUEST:: TAKE ONE TABLET BY MOUTH THREE TIMES DAILY     Not Delegated - Gastroenterology: Antiemetics - metoclopramide Failed - 01/05/2022 11:53 AM      Failed - This refill cannot be delegated      Passed - Cr in normal range and within 360 days    Creatinine  Date Value Ref Range Status  02/01/2014 1.04 0.60 - 1.30 mg/dL Final   Creatinine, Ser  Date Value Ref Range Status  12/31/2021 0.98 0.44 - 1.00 mg/dL Final         Passed - Valid encounter within last 6 months    Recent Outpatient Visits           1 week ago Abdominal pain, chronic, generalized   [facility] [name], MD   6 months ago Gastrointestinal hemorrhage, unspecified gastrointestinal hemorrhage type   [facility] [name], MD   1 year ago Benign essential HTN   [facility] [name], MD   2 years ago Benign essential HTN   [facility] [name], MD   2 years ago Benign essential HTN   [facility] [name], MD       Future Appointments             In 4 months Army Melia Jesse Sans, MD [facility]             OLANZapine (ZYPREXA) 2.5 MG tablet [Pharmacy Med Name: olanzapine 2.5 mg tablet] 90 tablet 0    Sig: NEW PRESCRIPTION REQUEST:: TAKE ONE TABLET BY MOUTH EVERY DAY     Not Delegated - Psychiatry:  Antipsychotics - Second Generation (Atypical) - olanzapine Failed - 01/05/2022 11:53 AM      Failed - This refill cannot be delegated      Failed - TSH in normal range and within 360 days    TSH  Date Value Ref Range Status  06/04/2020 1.150 0.450 - 4.500 uIU/mL Final         Failed - Last BP in normal range    BP Readings from Last 1 Encounters:   01/03/22 (!) 168/76         Failed - Lipid Panel in normal range within the last 12 months    Cholesterol, Total  Date Value Ref Range Status  06/02/2016 157 100 - 199 mg/dL Final   Cholesterol  Date Value Ref Range Status  02/05/2019 163 0 - 200 mg/dL Final   LDL Calculated  Date Value Ref Range Status  06/02/2016 80 0 - 99 mg/dL Final   LDL Cholesterol  Date Value Ref Range Status  02/05/2019 86 0 - 99 mg/dL Final    Comment:           Total Cholesterol/HDL:CHD Risk Coronary Heart Disease Risk Table                     Men   Women  1/2 Average Risk   3.4   3.3  Average Risk       5.0   4.4  2 X Average Risk   9.6   7.1  3 X Average Risk  23.4  11.0        Use the calculated Patient Ratio above and the CHD Risk Table to determine the patient's CHD Risk.        ATP III CLASSIFICATION (LDL):  <100     mg/dL   Optimal  100-129  mg/dL   Near or Above                    Optimal  130-159  mg/dL   Borderline  160-189  mg/dL   High  >190     mg/dL   Very High Performed at [place], [place] [address]    HDL  Date Value Ref Range Status  02/05/2019 63 >40 mg/dL Final  06/02/2016 61 >39 mg/dL Final   Triglycerides  Date Value Ref Range Status  02/05/2019 72 <150 mg/dL Final         Failed - CBC within normal limits and completed in the last 12 months    WBC  Date Value Ref Range Status  12/31/2021 8.4 4.0 - 10.5 K/uL Final   RBC  Date Value Ref Range Status  12/31/2021 4.18 3.87 - 5.11 MIL/uL Final   Hemoglobin  Date Value Ref Range Status  12/31/2021 11.5 (L) 12.0 - 15.0 g/dL Final  12/29/2021 11.7 11.1 - 15.9 g/dL Final   HCT  Date Value Ref Range Status  12/31/2021 38.3 36.0 - 46.0 % Final   Hematocrit  Date Value Ref Range Status  12/29/2021 37.7 34.0 - 46.6 % Final   MCHC  Date Value Ref Range Status  12/31/2021 30.0 30.0 - 36.0 g/dL Final   [facility]  Date Value Ref Range Status  12/31/2021 27.5 26.0 -  34.0 pg Final   MCV  Date Value Ref Range Status  12/31/2021 91.6 80.0 - 100.0 fL Final  12/29/2021 89 79 - 97 fL Final  02/01/2014 90 80 - 100 fL Final   No results found for: "PLTCOUNTKUC", "LABPLAT", "POCPLA" RDW  Date Value Ref Range Status  12/31/2021 18.2 (H) 11.5 - 15.5 % Final  12/29/2021 15.8 (H) 11.7 - 15.4 % Final  02/01/2014 15.9 (H) 11.5 - 14.5 % Final         Failed - CMP within normal limits and completed in the last 12 months    Albumin  Date Value Ref Range Status  12/31/2021 3.6 3.5 - 5.0 g/dL Final  06/04/2020 4.4 3.7 - 4.7 g/dL Final  02/01/2014 3.6 3.4 - 5.0 g/dL Final   Alkaline Phosphatase  Date Value Ref Range Status  12/31/2021 64 38 - 126 U/L Final  02/01/2014 106 Unit/L Final    Comment:    45-117 NOTE: New Reference Range 06/07/13    ALT  Date Value Ref Range Status  12/31/2021 19 0 - 44 U/L Final   SGPT (ALT)  Date Value Ref Range Status  02/01/2014 22 12 - 78 U/L Final   AST  Date Value Ref Range Status  12/31/2021 23 15 - 41 U/L Final   SGOT(AST)  Date Value Ref Range Status  02/01/2014 22 15 - 37 Unit/L Final   BUN  Date Value Ref Range Status  12/31/2021 26 (H) 8 - 23 mg/dL Final  12/29/2021 15 8 - 27 mg/dL Final  02/01/2014 14 7 - 18 mg/dL Final   Calcium  Date Value Ref Range Status  12/31/2021 9.4 8.9 - 10.3 mg/dL Final   Calcium, Total  Date Value Ref Range Status  02/01/2014  9.2 8.5 - 10.1 mg/dL Final   CO2  Date Value Ref Range Status  12/31/2021 28 22 - 32 mmol/L Final   Co2  Date Value Ref Range Status  02/01/2014 29 21 - 32 mmol/L Final   Bicarbonate  Date Value Ref Range Status  01/14/2019 34.0 (H) 20.0 - 28.0 mmol/L Final   Creatinine  Date Value Ref Range Status  02/01/2014 1.04 0.60 - 1.30 mg/dL Final   Creatinine, Ser  Date Value Ref Range Status  12/31/2021 0.98 0.44 - 1.00 mg/dL Final   Glucose  Date Value Ref Range Status  02/01/2014 103 (H) 65 - 99 mg/dL Final   Glucose, Bld   Date Value Ref Range Status  12/31/2021 119 (H) 70 - 99 mg/dL Final    Comment:    Glucose reference range applies only to samples taken after fasting for at least 8 hours.   Glucose-Capillary  Date Value Ref Range Status  01/14/2019 109 (H) 70 - 99 mg/dL Final   Potassium  Date Value Ref Range Status  12/31/2021 4.7 3.5 - 5.1 mmol/L Final    Comment:    HEMOLYSIS AT THIS LEVEL MAY AFFECT RESULT  02/01/2014 3.5 3.5 - 5.1 mmol/L Final   Sodium  Date Value Ref Range Status  12/31/2021 137 135 - 145 mmol/L Final  12/29/2021 140 134 - 144 mmol/L Final  02/01/2014 140 136 - 145 mmol/L Final   Total Bilirubin  Date Value Ref Range Status  12/31/2021 0.6 0.3 - 1.2 mg/dL Final   Bilirubin,Total  Date Value Ref Range Status  02/01/2014 0.2 0.2 - 1.0 mg/dL Final   Bilirubin Total  Date Value Ref Range Status  06/04/2020 0.2 0.0 - 1.2 mg/dL Final   Bilirubin, Direct  Date Value Ref Range Status  09/12/2015 <0.1 (L) 0.1 - 0.5 mg/dL Final   Indirect Bilirubin  Date Value Ref Range Status  09/12/2015 NOT CALCULATED 0.3 - 0.9 mg/dL Final   Protein, ur  Date Value Ref Range Status  12/31/2021 NEGATIVE NEGATIVE mg/dL Final   Total Protein  Date Value Ref Range Status  12/31/2021 7.4 6.5 - 8.1 g/dL Final  06/04/2020 7.5 6.0 - 8.5 g/dL Final  02/01/2014 8.6 (H) 6.4 - 8.2 g/dL Final   EGFR (African American)  Date Value Ref Range Status  02/01/2014 >60  Final   GFR calc Af Amer  Date Value Ref Range Status  06/04/2020 71 >59 mL/min/1.73 Final    Comment:    **In accordance with recommendations from the NKF-ASN Task force,**   [facility] is in the process of updating its eGFR calculation to the   2021 CKD-EPI creatinine equation that estimates kidney function   without a race variable.    eGFR  Date Value Ref Range Status  12/29/2021 68 >59 mL/min/1.73 Final   EGFR (Non-African Amer.)  Date Value Ref Range Status  02/01/2014 56 (L)  Final    Comment:    eGFR values  <77m/min/1.73 m2 may be an indication of chronic kidney disease (CKD). Calculated eGFR is useful in patients with stable renal function. The eGFR calculation will not be reliable in acutely ill patients when serum creatinine is changing rapidly. It is not useful in  patients on dialysis. The eGFR calculation may not be applicable to patients at the low and high extremes of body sizes, pregnant women, and vegetarians.    GFR, Estimated  Date Value Ref Range Status  12/31/2021 >60 >60 mL/min Final    Comment:    (  NOTE) Calculated using the CKD-EPI Creatinine Equation (2021)          Passed - Completed PHQ-2 or PHQ-9 in the last 360 days      Passed - Last Heart Rate in normal range    Pulse Readings from Last 1 Encounters:  01/03/22 81         Passed - Valid encounter within last 6 months    Recent Outpatient Visits           1 week ago Abdominal pain, chronic, generalized   [facility] [name], MD   6 months ago Gastrointestinal hemorrhage, unspecified gastrointestinal hemorrhage type   [facility] [name], MD   1 year ago Benign essential HTN   [facility] [name], MD   2 years ago Benign essential HTN   [facility] [name], MD   2 years ago Benign essential HTN   [facility] [name], MD       Future Appointments             In 4 months Army Melia Jesse Sans, MD [facility]             ondansetron (ZOFRAN) 4 MG tablet [Pharmacy Med Name: ondansetron HCl 4 mg tablet] 270 tablet 0    Sig: NEW PRESCRIPTION REQUEST:: TAKE ONE TABLET BY MOUTH EVERY 8 HOURS AS NEEDED     Not Delegated - Gastroenterology: Antiemetics - ondansetron Failed - 01/05/2022 11:53 AM      Failed - This refill cannot be delegated      Passed - AST in normal range and within 360 days    AST  Date Value Ref Range Status  12/31/2021 23 15 - 41 U/L Final   SGOT(AST)  Date Value  Ref Range Status  02/01/2014 22 15 - 37 Unit/L Final         Passed - ALT in normal range and within 360 days    ALT  Date Value Ref Range Status  12/31/2021 19 0 - 44 U/L Final   SGPT (ALT)  Date Value Ref Range Status  02/01/2014 22 12 - 78 U/L Final         Passed - Valid encounter within last 6 months    Recent Outpatient Visits           1 week ago Abdominal pain, chronic, generalized   [facility] [name], MD   6 months ago Gastrointestinal hemorrhage, unspecified gastrointestinal hemorrhage type   [facility] [name], MD   1 year ago Benign essential HTN   [facility] [name], MD   2 years ago Benign essential HTN   [facility] [name], MD   2 years ago Benign essential HTN   [facility] [name], MD       Future Appointments             In 4 months Army Melia Jesse Sans, MD [facility]             pantoprazole (PROTONIX) 40 MG tablet [Pharmacy Med Name: pantoprazole 40 mg tablet,delayed release] 180 tablet 0    Sig: NEW PRESCRIPTION REQUEST:; TAKE ONE TABLET BY MOUTH TWICE DAILY     Gastroenterology: Proton Pump Inhibitors Passed - 01/05/2022 11:53 AM      Passed - Valid encounter within last 12 months  Recent Outpatient Visits           1 week ago Abdominal pain, chronic, generalized   [facility] [name], MD   6 months ago Gastrointestinal hemorrhage, unspecified gastrointestinal hemorrhage type   [facility] [name], MD   1 year ago Benign essential HTN   [facility] [name], MD   2 years ago Benign essential HTN   [facility] [name], MD   2 years ago Benign essential HTN   [facility] [name], MD       Future Appointments             In 4 months Army Melia Jesse Sans, MD [facility]             meclizine  (ANTIVERT) 12.5 MG tablet [Pharmacy Med Name: meclizine 12.5 mg tablet] 90 tablet 0    Sig: NEW PRESCRIPTION REQUEST:; TAKE ONE TABLET BY MOUTH AS NEEDED     Not Delegated - Gastroenterology: Antiemetics Failed - 01/05/2022 11:53 AM      Failed - This refill cannot be delegated      Passed - Valid encounter within last 6 months    Recent Outpatient Visits           1 week ago Abdominal pain, chronic, generalized   [facility] [name], MD   6 months ago Gastrointestinal hemorrhage, unspecified gastrointestinal hemorrhage type   [facility] [name], MD   1 year ago Benign essential HTN   [facility] [name], MD   2 years ago Benign essential HTN   [facility] [name], MD   2 years ago Benign essential HTN   [facility] [name], MD       Future Appointments             In 4 months Army Melia Jesse Sans, MD [facility]            Signed Prescriptions Disp Refills   hydrALAZINE (APRESOLINE) 25 MG tablet 270 tablet 0    Sig: NEW PRESCRIPTION REQUEST:: TAKE ONE TABLET BY MOUTH THREE TIMES DAILY     Cardiovascular:  Vasodilators Failed - 01/05/2022 11:53 AM      Failed - HGB in normal range and within 360 days    Hemoglobin  Date Value Ref Range Status  12/31/2021 11.5 (L) 12.0 - 15.0 g/dL Final  12/29/2021 11.7 11.1 - 15.9 g/dL Final         Failed - ANA Screen, Ifa, Serum in normal range and within 360 days    No results found for: "ANA", "ANATITER", "LABANTI"       Failed - Last BP in normal range    BP Readings from Last 1 Encounters:  01/03/22 (!) 168/76         Passed - HCT in normal range and within 360 days    HCT  Date Value Ref Range Status  12/31/2021 38.3 36.0 - 46.0 % Final   Hematocrit  Date Value Ref Range Status  12/29/2021 37.7 34.0 - 46.6 % Final         Passed - RBC in normal range and within 360 days    RBC  Date Value Ref Range  Status  12/31/2021 4.18 3.87 - 5.11 MIL/uL Final         Passed - WBC in normal  range and within 360 days    WBC  Date Value Ref Range Status  12/31/2021 8.4 4.0 - 10.5 K/uL Final         Passed - PLT in normal range and within 360 days    Platelets  Date Value Ref Range Status  12/31/2021 278 150 - 400 K/uL Final  12/29/2021 260 150 - 450 x10E3/uL Final         Passed - Valid encounter within last 12 months    Recent Outpatient Visits           1 week ago Abdominal pain, chronic, generalized   [facility] [name], MD   6 months ago Gastrointestinal hemorrhage, unspecified gastrointestinal hemorrhage type   [facility] [name], MD   1 year ago Benign essential HTN   [facility] [name], MD   2 years ago Benign essential HTN   [facility] [name], MD   2 years ago Benign essential HTN   [facility] [name], MD       Future Appointments             In 4 months Army Melia Jesse Sans, MD [facility] [place] Prescriptions Disp Refills   Probiotic Product (RA PROBIOTIC COMPLEX) CAPS [Pharmacy Med Name: Probiotic Complex 25 billion cell-100 mg capsule] 180 capsule 0    Sig: NEW PRESCRIPTION REQUEST:; TAKE ONE CAPSULE BY MOUTH TWICE DAILY     Off-Protocol Failed - 01/05/2022 11:53 AM      Failed - Medication not assigned to a protocol, review manually.      Passed - Valid encounter within last 12 months    Recent Outpatient Visits           1 week ago Abdominal pain, chronic, generalized   [facility] [name], MD   6 months ago Gastrointestinal hemorrhage, unspecified gastrointestinal hemorrhage type   [facility] [name], MD   1 year ago Benign essential HTN   [facility] [name], MD   2 years ago Benign essential HTN   [facility] [name], MD   2 years ago  Benign essential HTN   [facility] [name], MD       Future Appointments             In 4 months Army Melia Jesse Sans, MD [facility] [place] 8.6-50 MG tablet [Pharmacy Med Name: Stimulant Laxative Plus 8.6 mg-50 mg tablet] 90 tablet 0    Sig: NEW PRESCRIPTION REQUEST:; TAKE ONE TABLET BY MOUTH AS NEEDED     Over the Counter:  OTC Passed - 01/05/2022 11:53 AM      Passed - Valid encounter within last 12 months    Recent Outpatient Visits           1 week ago Abdominal pain, chronic, generalized   [facility] [name], MD   6 months ago Gastrointestinal hemorrhage, unspecified gastrointestinal hemorrhage type   [facility] [name], MD   1 year ago Benign essential HTN   [facility] [name], MD   2 years ago Benign essential HTN   [facility] [name], MD   2  years ago Benign essential HTN   [facility] [name], MD       Future Appointments             In 4 months Army Melia Jesse Sans, MD [facility]

## 2022-01-05 NOTE — Telephone Encounter (Signed)
Called to clarify that pt wants all rx changed to new pharm. Dr. Army Melia has already renewed some for here in town. Will notify her of new pharmacy.

## 2022-01-05 NOTE — Telephone Encounter (Signed)
Medication Refill - Medication: meclizine (ANTIVERT) 12.5 MG tablet  Has the patient contacted their pharmacy? Yes.   Pt told to contact provider  Preferred Pharmacy (with phone number or street name):  [id number] [address], [address] [facility] Phone:  [phone]  Fax:  [phone]     Has the patient been seen for an appointment in the last year OR does the patient have an upcoming appointment? Yes.    Agent: Please be advised that RX refills may take up to 3 business days. We ask that you follow-up with your pharmacy.

## 2022-01-05 NOTE — Telephone Encounter (Signed)
Requested medication (s) are due for refill today: yes  Requested medication (s) are on the active medication list: yes  Last refill:  08/31/21  Future visit scheduled: yes  Notes to clinic:  Unable to refill per protocol, cannot delegate.      Requested Prescriptions  Pending Prescriptions Disp Refills   meclizine (ANTIVERT) 12.5 MG tablet 30 tablet 2     Not Delegated - Gastroenterology: Antiemetics Failed - 01/05/2022 10:17 AM      Failed - This refill cannot be delegated      Passed - Valid encounter within last 6 months    Recent Outpatient Visits           1 week ago Abdominal pain, chronic, generalized   [facility] [name], MD   6 months ago Gastrointestinal hemorrhage, unspecified gastrointestinal hemorrhage type   [facility] [name], MD   1 year ago Benign essential HTN   [facility] [name], MD   2 years ago Benign essential HTN   [facility] [name], MD   2 years ago Benign essential HTN   [facility] [name], MD       Future Appointments             In 4 months Army Melia Jesse Sans, MD [facility], [facility]

## 2022-01-05 NOTE — Telephone Encounter (Signed)
Requested Prescriptions  Pending Prescriptions Disp Refills  . hydrALAZINE (APRESOLINE) 25 MG tablet [Pharmacy Med Name: hydralazine 25 mg tablet] 270 tablet 0    Sig: NEW PRESCRIPTION REQUEST:: TAKE ONE TABLET BY MOUTH THREE TIMES DAILY     Cardiovascular:  Vasodilators Failed - 01/05/2022 11:53 AM      Failed - HGB in normal range and within 360 days    Hemoglobin  Date Value Ref Range Status  12/31/2021 11.5 (L) 12.0 - 15.0 g/dL Final  12/29/2021 11.7 11.1 - 15.9 g/dL Final         Failed - ANA Screen, Ifa, Serum in normal range and within 360 days    No results found for: "ANA", "ANATITER", "LABANTI"       Failed - Last BP in normal range    BP Readings from Last 1 Encounters:  01/03/22 (!) 168/76         Passed - HCT in normal range and within 360 days    HCT  Date Value Ref Range Status  12/31/2021 38.3 36.0 - 46.0 % Final   Hematocrit  Date Value Ref Range Status  12/29/2021 37.7 34.0 - 46.6 % Final         Passed - RBC in normal range and within 360 days    RBC  Date Value Ref Range Status  12/31/2021 4.18 3.87 - 5.11 MIL/uL Final         Passed - WBC in normal range and within 360 days    WBC  Date Value Ref Range Status  12/31/2021 8.4 4.0 - 10.5 K/uL Final         Passed - PLT in normal range and within 360 days    Platelets  Date Value Ref Range Status  12/31/2021 278 150 - 400 K/uL Final  12/29/2021 260 150 - 450 x10E3/uL Final         Passed - Valid encounter within last 12 months    Recent Outpatient Visits          1 week ago Abdominal pain, chronic, generalized   [facility] [name], MD   6 months ago Gastrointestinal hemorrhage, unspecified gastrointestinal hemorrhage type   [facility] [name], MD   1 year ago Benign essential HTN   [facility] [name], MD   2 years ago Benign essential HTN   [facility] [name], MD   2 years ago Benign essential HTN    [facility] [name], MD      Future Appointments            In 4 months Army Melia Jesse Sans, MD [facility], [place]           . metoCLOPramide (REGLAN) 5 MG tablet [Pharmacy Med Name: metoclopramide 5 mg tablet] 270 tablet 0    Sig: NEW PRESCRIPTION REQUEST:: TAKE ONE TABLET BY MOUTH THREE TIMES DAILY     Not Delegated - Gastroenterology: Antiemetics - metoclopramide Failed - 01/05/2022 11:53 AM      Failed - This refill cannot be delegated      Passed - Cr in normal range and within 360 days    Creatinine  Date Value Ref Range Status  02/01/2014 1.04 0.60 - 1.30 mg/dL Final   Creatinine, Ser  Date Value Ref Range Status  12/31/2021 0.98 0.44 - 1.00 mg/dL Final         Passed - Valid encounter within  last 6 months    Recent Outpatient Visits          1 week ago Abdominal pain, chronic, generalized   [facility] [name], MD   6 months ago Gastrointestinal hemorrhage, unspecified gastrointestinal hemorrhage type   [facility] [name], MD   1 year ago Benign essential HTN   [facility] [name], MD   2 years ago Benign essential HTN   [facility] [name], MD   2 years ago Benign essential HTN   [facility] [name], MD      Future Appointments            In 4 months Army Melia Jesse Sans, MD [facility], [place]           . OLANZapine (ZYPREXA) 2.5 MG tablet [Pharmacy Med Name: olanzapine 2.5 mg tablet] 90 tablet 0    Sig: NEW PRESCRIPTION REQUEST:: TAKE ONE TABLET BY MOUTH EVERY DAY     Not Delegated - Psychiatry:  Antipsychotics - Second Generation (Atypical) - olanzapine Failed - 01/05/2022 11:53 AM      Failed - This refill cannot be delegated      Failed - TSH in normal range and within 360 days    TSH  Date Value Ref Range Status  06/04/2020 1.150 0.450 - 4.500 uIU/mL Final         Failed - Last BP in normal range    BP Readings  from Last 1 Encounters:  01/03/22 (!) 168/76         Failed - Lipid Panel in normal range within the last 12 months    Cholesterol, Total  Date Value Ref Range Status  06/02/2016 157 100 - 199 mg/dL Final   Cholesterol  Date Value Ref Range Status  02/05/2019 163 0 - 200 mg/dL Final   LDL Calculated  Date Value Ref Range Status  06/02/2016 80 0 - 99 mg/dL Final   LDL Cholesterol  Date Value Ref Range Status  02/05/2019 86 0 - 99 mg/dL Final    Comment:           Total Cholesterol/HDL:CHD Risk Coronary Heart Disease Risk Table                     Men   Women  1/2 Average Risk   3.4   3.3  Average Risk       5.0   4.4  2 X Average Risk   9.6   7.1  3 X Average Risk  23.4   11.0        Use the calculated Patient Ratio above and the CHD Risk Table to determine the patient's CHD Risk.        ATP III CLASSIFICATION (LDL):  <100     mg/dL   Optimal  100-129  mg/dL   Near or Above                    Optimal  130-159  mg/dL   Borderline  160-189  mg/dL   High  >190     mg/dL   Very High Performed at [facility], [place],  [address]    HDL  Date Value Ref Range Status  02/05/2019 63 >40 mg/dL Final  06/02/2016 61 >39 mg/dL Final   Triglycerides  Date Value Ref Range Status  02/05/2019 72 <150 mg/dL Final  Failed - CBC within normal limits and completed in the last 12 months    WBC  Date Value Ref Range Status  12/31/2021 8.4 4.0 - 10.5 K/uL Final   RBC  Date Value Ref Range Status  12/31/2021 4.18 3.87 - 5.11 MIL/uL Final   Hemoglobin  Date Value Ref Range Status  12/31/2021 11.5 (L) 12.0 - 15.0 g/dL Final  12/29/2021 11.7 11.1 - 15.9 g/dL Final   HCT  Date Value Ref Range Status  12/31/2021 38.3 36.0 - 46.0 % Final   Hematocrit  Date Value Ref Range Status  12/29/2021 37.7 34.0 - 46.6 % Final   MCHC  Date Value Ref Range Status  12/31/2021 30.0 30.0 - 36.0 g/dL Final   [facility]  Date Value Ref Range Status   12/31/2021 27.5 26.0 - 34.0 pg Final   MCV  Date Value Ref Range Status  12/31/2021 91.6 80.0 - 100.0 fL Final  12/29/2021 89 79 - 97 fL Final  02/01/2014 90 80 - 100 fL Final   No results found for: "PLTCOUNTKUC", "LABPLAT", "POCPLA" RDW  Date Value Ref Range Status  12/31/2021 18.2 (H) 11.5 - 15.5 % Final  12/29/2021 15.8 (H) 11.7 - 15.4 % Final  02/01/2014 15.9 (H) 11.5 - 14.5 % Final         Failed - CMP within normal limits and completed in the last 12 months    Albumin  Date Value Ref Range Status  12/31/2021 3.6 3.5 - 5.0 g/dL Final  06/04/2020 4.4 3.7 - 4.7 g/dL Final  02/01/2014 3.6 3.4 - 5.0 g/dL Final   Alkaline Phosphatase  Date Value Ref Range Status  12/31/2021 64 38 - 126 U/L Final  02/01/2014 106 Unit/L Final    Comment:    45-117 NOTE: New Reference Range 06/07/13    ALT  Date Value Ref Range Status  12/31/2021 19 0 - 44 U/L Final   SGPT (ALT)  Date Value Ref Range Status  02/01/2014 22 12 - 78 U/L Final   AST  Date Value Ref Range Status  12/31/2021 23 15 - 41 U/L Final   SGOT(AST)  Date Value Ref Range Status  02/01/2014 22 15 - 37 Unit/L Final   BUN  Date Value Ref Range Status  12/31/2021 26 (H) 8 - 23 mg/dL Final  12/29/2021 15 8 - 27 mg/dL Final  02/01/2014 14 7 - 18 mg/dL Final   Calcium  Date Value Ref Range Status  12/31/2021 9.4 8.9 - 10.3 mg/dL Final   Calcium, Total  Date Value Ref Range Status  02/01/2014 9.2 8.5 - 10.1 mg/dL Final   CO2  Date Value Ref Range Status  12/31/2021 28 22 - 32 mmol/L Final   Co2  Date Value Ref Range Status  02/01/2014 29 21 - 32 mmol/L Final   Bicarbonate  Date Value Ref Range Status  01/14/2019 34.0 (H) 20.0 - 28.0 mmol/L Final   Creatinine  Date Value Ref Range Status  02/01/2014 1.04 0.60 - 1.30 mg/dL Final   Creatinine, Ser  Date Value Ref Range Status  12/31/2021 0.98 0.44 - 1.00 mg/dL Final   Glucose  Date Value Ref Range Status  02/01/2014 103 (H) 65 - 99 mg/dL  Final   Glucose, Bld  Date Value Ref Range Status  12/31/2021 119 (H) 70 - 99 mg/dL Final    Comment:    Glucose reference range applies only to samples taken after fasting for at least 8 hours.   Glucose-Capillary  Date Value Ref Range Status  01/14/2019 109 (H) 70 - 99 mg/dL Final   Potassium  Date Value Ref Range Status  12/31/2021 4.7 3.5 - 5.1 mmol/L Final    Comment:    HEMOLYSIS AT THIS LEVEL MAY AFFECT RESULT  02/01/2014 3.5 3.5 - 5.1 mmol/L Final   Sodium  Date Value Ref Range Status  12/31/2021 137 135 - 145 mmol/L Final  12/29/2021 140 134 - 144 mmol/L Final  02/01/2014 140 136 - 145 mmol/L Final   Total Bilirubin  Date Value Ref Range Status  12/31/2021 0.6 0.3 - 1.2 mg/dL Final   Bilirubin,Total  Date Value Ref Range Status  02/01/2014 0.2 0.2 - 1.0 mg/dL Final   Bilirubin Total  Date Value Ref Range Status  06/04/2020 0.2 0.0 - 1.2 mg/dL Final   Bilirubin, Direct  Date Value Ref Range Status  09/12/2015 <0.1 (L) 0.1 - 0.5 mg/dL Final   Indirect Bilirubin  Date Value Ref Range Status  09/12/2015 NOT CALCULATED 0.3 - 0.9 mg/dL Final   Protein, ur  Date Value Ref Range Status  12/31/2021 NEGATIVE NEGATIVE mg/dL Final   Total Protein  Date Value Ref Range Status  12/31/2021 7.4 6.5 - 8.1 g/dL Final  06/04/2020 7.5 6.0 - 8.5 g/dL Final  02/01/2014 8.6 (H) 6.4 - 8.2 g/dL Final   EGFR (African American)  Date Value Ref Range Status  02/01/2014 >60  Final   GFR calc Af Amer  Date Value Ref Range Status  06/04/2020 71 >59 mL/min/1.73 Final    Comment:    **In accordance with recommendations from the NKF-ASN Task force,**   [facility] is in the process of updating its eGFR calculation to the   2021 CKD-EPI creatinine equation that estimates kidney function   without a race variable.    eGFR  Date Value Ref Range Status  12/29/2021 68 >59 mL/min/1.73 Final   EGFR (Non-African Amer.)  Date Value Ref Range Status  02/01/2014 56 (L)  Final     Comment:    eGFR values <54m/min/1.73 m2 may be an indication of chronic kidney disease (CKD). Calculated eGFR is useful in patients with stable renal function. The eGFR calculation will not be reliable in acutely ill patients when serum creatinine is changing rapidly. It is not useful in  patients on dialysis. The eGFR calculation may not be applicable to patients at the low and high extremes of body sizes, pregnant women, and vegetarians.    GFR, Estimated  Date Value Ref Range Status  12/31/2021 >60 >60 mL/min Final    Comment:    (NOTE) Calculated using the CKD-EPI Creatinine Equation (2021)          Passed - Completed PHQ-2 or PHQ-9 in the last 360 days      Passed - Last Heart Rate in normal range    Pulse Readings from Last 1 Encounters:  01/03/22 81         Passed - Valid encounter within last 6 months    Recent Outpatient Visits          1 week [facility], [facility], [facility]   6 [facility]astrointestinal hemorrhage, unspecified gastrointestinal hemorrhage type   [facility]   1 year ago [facility]ign essential HTN   [facility]   2 [facility] Benign essential HTN   [facility]  2 years ago Benign essential HTN   [facility] [name], MD      Future Appointments            In 4 months Army Melia Jesse Sans, MD [facility], [facility]           . ondansetron [facility]) 4 MG tablet [Pharmacy Med Name: ondansetron HCl 4 mg tablet] 270 tablet 0    Sig: NEW PRESCRIPTION REQUEST:: TAKE ONE TABLET BY MOUTH EVERY 8 HOURS AS NEEDED     Not Delegated - Gastroenterology: Antiemetics - ondansetron Failed - 01/05/2022 11:53 AM      Failed - This refill cannot be delegated      Passed - AST in normal range and within 360 days    AST  Date Value Ref Range Status  12/31/2021 23 15 - 41 U/L Final    SGOT(AST)  Date Value Ref Range Status  02/01/2014 22 15 - 37 Unit/L Final         Passed - ALT in normal range and within 360 days    ALT  Date Value Ref Range Status  12/31/2021 19 0 - 44 U/L Final   SGPT (ALT)  Date Value Ref Range Status  02/01/2014 22 12 - 78 U/L Final         Passed - Valid encounter within last 6 months    Recent Outpatient Visits          1 week ago Abdominal pain, chronic, generalized   [facility] [name], MD   6 months ago Gastrointestinal hemorrhage, unspecified gastrointestinal hemorrhage type   [facility] [name], MD   1 year ago Benign essential HTN   [facility] [name], MD   2 years ago Benign essential HTN   [facility] [name], MD   2 years ago Benign essential HTN   [facility] [name], MD      Future Appointments            In 4 months Army Melia Jesse Sans, MD [facility], [place]           . Probiotic Product (RA PROBIOTIC COMPLEX) CAPS [Pharmacy Med Name: Probiotic Complex 25 billion cell-100 mg capsule] 180 capsule 0    Sig: NEW PRESCRIPTION REQUEST:; TAKE ONE CAPSULE BY MOUTH TWICE DAILY     Off-Protocol Failed - 01/05/2022 11:53 AM      Failed - Medication not assigned to a protocol, review manually.      Passed - Valid encounter within last 12 months    Recent Outpatient Visits          1 week ago Abdominal pain, chronic, generalized   [facility] [name], MD   6 months ago Gastrointestinal hemorrhage, unspecified gastrointestinal hemorrhage type   [facility] [name], MD   1 year ago Benign essential HTN   [facility] [name], MD   2 years ago Benign essential HTN   [facility] [name], MD   2 years ago Benign essential HTN   [facility] [name], MD      Future Appointments            In 4 months Army Melia  Jesse Sans, MD [facility], [place]           . pantoprazole (PROTONIX) 40 MG tablet [Pharmacy Med  Name: pantoprazole 40 mg tablet,delayed release] 180 tablet 0    Sig: NEW PRESCRIPTION REQUEST:; TAKE ONE TABLET BY MOUTH TWICE DAILY     Gastroenterology: Proton Pump Inhibitors Passed - 01/05/2022 11:53 AM      Passed - Valid encounter within last 12 months    Recent Outpatient Visits          1 week ago Abdominal pain, chronic, generalized   [facility] [name], MD   6 months ago Gastrointestinal hemorrhage, unspecified gastrointestinal hemorrhage type   [facility] [name], MD   1 year ago Benign essential HTN   [facility] [name], MD   2 years ago Benign essential HTN   [facility] [name], MD   2 years ago Benign essential HTN   [facility] [name], MD      Future Appointments            In 4 months Army Melia Jesse Sans, MD [facility], [place]           . STIMULANT LAXATIVE 8.6-50 MG tablet [Pharmacy Med Name: Stimulant Laxative Plus 8.6 mg-50 mg tablet] 90 tablet 0    Sig: NEW PRESCRIPTION REQUEST:; TAKE ONE TABLET BY MOUTH AS NEEDED     Over the Counter:  OTC Passed - 01/05/2022 11:53 AM      Passed - Valid encounter within last 12 months    Recent Outpatient Visits          1 week ago Abdominal pain, chronic, generalized   [facility] [name], MD   6 months ago Gastrointestinal hemorrhage, unspecified gastrointestinal hemorrhage type   [facility] [name], MD   1 year ago Benign essential HTN   [facility] [name], MD   2 years ago Benign essential HTN   [facility] [name], MD   2 years ago Benign essential HTN   [facility] [name], MD      Future Appointments            In 4 months Army Melia Jesse Sans, MD [facility]           .  meclizine (ANTIVERT) 12.5 MG tablet [Pharmacy Med Name: meclizine 12.5 mg tablet] 90 tablet 0    Sig: NEW PRESCRIPTION REQUEST:; TAKE ONE TABLET BY MOUTH AS NEEDED     Not Delegated - Gastroenterology: Antiemetics Failed - 01/05/2022 11:53 AM      Failed - This refill cannot be delegated      Passed - Valid encounter within last 6 months    Recent Outpatient Visits          1 week ago Abdominal pain, chronic, generalized   [facility] [name], MD   6 months ago Gastrointestinal hemorrhage, unspecified gastrointestinal hemorrhage type   [facility] [name], MD   1 year ago Benign essential HTN   [facility] [name], MD   2 years ago Benign essential HTN   [facility] [name], MD   2 years ago Benign essential HTN   [facility] [name], MD      Future Appointments            In 4 months Army Melia Jesse Sans, MD [facility], [facility]

## 2022-01-05 NOTE — Telephone Encounter (Signed)
No order for these meds. Requested Prescriptions  Pending Prescriptions Disp Refills  . metoCLOPramide (REGLAN) 5 MG tablet [Pharmacy Med Name: metoclopramide 5 mg tablet] 270 tablet 0    Sig: NEW PRESCRIPTION REQUEST:: TAKE ONE TABLET BY MOUTH THREE TIMES DAILY     Not Delegated - Gastroenterology: Antiemetics - metoclopramide Failed - 01/05/2022 11:53 AM      Failed - This refill cannot be delegated      Passed - Cr in normal range and within 360 days    Creatinine  Date Value Ref Range Status  02/01/2014 1.04 0.60 - 1.30 mg/dL Final   Creatinine, Ser  Date Value Ref Range Status  12/31/2021 0.98 0.44 - 1.00 mg/dL Final         Passed - Valid encounter within last 6 months    Recent Outpatient Visits          1 week ago Abdominal pain, chronic, generalized   [facility] [name], MD   6 months ago Gastrointestinal hemorrhage, unspecified gastrointestinal hemorrhage type   [facility] [name], MD   1 year ago Benign essential HTN   [facility] [name], MD   2 years ago Benign essential HTN   [facility] [name], MD   2 years ago Benign essential HTN   [facility] [name], MD      Future Appointments            In 4 months [employer] Melia Jesse Sans, MD [facility] [place]           . OLANZapine (ZYPREXA) 2.5 MG tablet [Pharmacy Med Name: olanzapine 2.5 mg tablet] 90 tablet 0    Sig: NEW PRESCRIPTION REQUEST:: TAKE ONE TABLET BY MOUTH EVERY DAY     Not Delegated - Psychiatry:  Antipsychotics - Second Generation (Atypical) - olanzapine Failed - 01/05/2022 11:53 AM      Failed - This refill cannot be delegated      Failed - TSH in normal range and within 360 days    TSH  Date Value Ref Range Status  06/04/2020 1.150 0.450 - 4.500 uIU/mL Final         Failed - Last BP in normal range    BP Readings from Last 1 Encounters:  01/03/22 (!) 168/76         Failed - Lipid  Panel in normal range within the last 12 months    Cholesterol, Total  Date Value Ref Range Status  06/02/2016 157 100 - 199 mg/dL Final   Cholesterol  Date Value Ref Range Status  02/05/2019 163 0 - 200 mg/dL Final   LDL Calculated  Date Value Ref Range Status  06/02/2016 80 0 - 99 mg/dL Final   LDL Cholesterol  Date Value Ref Range Status  02/05/2019 86 0 - 99 mg/dL Final    Comment:           Total Cholesterol/HDL:CHD Risk Coronary Heart Disease Risk Table                     Men   Women  1/2 Average Risk   3.4   3.3  Average Risk       5.0   4.4  2 X Average Risk   9.6   7.1  3 X Average Risk  23.4   11.0        Use the calculated Patient Ratio above  and the CHD Risk Table to determine the patient's CHD Risk.        ATP III CLASSIFICATION (LDL):  <100     mg/dL   Optimal  100-129  mg/dL   Near or Above                    Optimal  130-159  mg/dL   Borderline  160-189  mg/dL   High  >190     mg/dL   Very High Performed at [facility], [place] [address]    HDL  Date Value Ref Range Status  02/05/2019 63 >40 mg/dL Final  06/02/2016 61 >39 mg/dL Final   Triglycerides  Date Value Ref Range Status  02/05/2019 72 <150 mg/dL Final         Failed - CBC within normal limits and completed in the last 12 months    WBC  Date Value Ref Range Status  12/31/2021 8.4 4.0 - 10.5 K/uL Final   RBC  Date Value Ref Range Status  12/31/2021 4.18 3.87 - 5.11 MIL/uL Final   Hemoglobin  Date Value Ref Range Status  12/31/2021 11.5 (L) 12.0 - 15.0 g/dL Final  12/29/2021 11.7 11.1 - 15.9 g/dL Final   HCT  Date Value Ref Range Status  12/31/2021 38.3 36.0 - 46.0 % Final   Hematocrit  Date Value Ref Range Status  12/29/2021 37.7 34.0 - 46.6 % Final   MCHC  Date Value Ref Range Status  12/31/2021 30.0 30.0 - 36.0 g/dL Final   [facility]  Date Value Ref Range Status  12/31/2021 27.5 26.0 - 34.0 pg Final   MCV  Date Value Ref Range  Status  12/31/2021 91.6 80.0 - 100.0 fL Final  12/29/2021 89 79 - 97 fL Final  02/01/2014 90 80 - 100 fL Final   No results found for: "PLTCOUNTKUC", "LABPLAT", "POCPLA" RDW  Date Value Ref Range Status  12/31/2021 18.2 (H) 11.5 - 15.5 % Final  12/29/2021 15.8 (H) 11.7 - 15.4 % Final  02/01/2014 15.9 (H) 11.5 - 14.5 % Final         Failed - CMP within normal limits and completed in the last 12 months    Albumin  Date Value Ref Range Status  12/31/2021 3.6 3.5 - 5.0 g/dL Final  06/04/2020 4.4 3.7 - 4.7 g/dL Final  02/01/2014 3.6 3.4 - 5.0 g/dL Final   Alkaline Phosphatase  Date Value Ref Range Status  12/31/2021 64 38 - 126 U/L Final  02/01/2014 106 Unit/L Final    Comment:    45-117 NOTE: New Reference Range 06/07/13    ALT  Date Value Ref Range Status  12/31/2021 19 0 - 44 U/L Final   SGPT (ALT)  Date Value Ref Range Status  02/01/2014 22 12 - 78 U/L Final   AST  Date Value Ref Range Status  12/31/2021 23 15 - 41 U/L Final   SGOT(AST)  Date Value Ref Range Status  02/01/2014 22 15 - 37 Unit/L Final   BUN  Date Value Ref Range Status  12/31/2021 26 (H) 8 - 23 mg/dL Final  12/29/2021 15 8 - 27 mg/dL Final  02/01/2014 14 7 - 18 mg/dL Final   Calcium  Date Value Ref Range Status  12/31/2021 9.4 8.9 - 10.3 mg/dL Final   Calcium, Total  Date Value Ref Range Status  02/01/2014 9.2 8.5 - 10.1 mg/dL Final   CO2  Date Value Ref Range  Status  12/31/2021 28 22 - 32 mmol/L Final   Co2  Date Value Ref Range Status  02/01/2014 29 21 - 32 mmol/L Final   Bicarbonate  Date Value Ref Range Status  01/14/2019 34.0 (H) 20.0 - 28.0 mmol/L Final   Creatinine  Date Value Ref Range Status  02/01/2014 1.04 0.60 - 1.30 mg/dL Final   Creatinine, Ser  Date Value Ref Range Status  12/31/2021 0.98 0.44 - 1.00 mg/dL Final   Glucose  Date Value Ref Range Status  02/01/2014 103 (H) 65 - 99 mg/dL Final   Glucose, Bld  Date Value Ref Range Status  12/31/2021 119  (H) 70 - 99 mg/dL Final    Comment:    Glucose reference range applies only to samples taken after fasting for at least 8 hours.   Glucose-Capillary  Date Value Ref Range Status  01/14/2019 109 (H) 70 - 99 mg/dL Final   Potassium  Date Value Ref Range Status  12/31/2021 4.7 3.5 - 5.1 mmol/L Final    Comment:    HEMOLYSIS AT THIS LEVEL MAY AFFECT RESULT  02/01/2014 3.5 3.5 - 5.1 mmol/L Final   Sodium  Date Value Ref Range Status  12/31/2021 137 135 - 145 mmol/L Final  12/29/2021 140 134 - 144 mmol/L Final  02/01/2014 140 136 - 145 mmol/L Final   Total Bilirubin  Date Value Ref Range Status  12/31/2021 0.6 0.3 - 1.2 mg/dL Final   Bilirubin,Total  Date Value Ref Range Status  02/01/2014 0.2 0.2 - 1.0 mg/dL Final   Bilirubin Total  Date Value Ref Range Status  06/04/2020 0.2 0.0 - 1.2 mg/dL Final   Bilirubin, Direct  Date Value Ref Range Status  09/12/2015 <0.1 (L) 0.1 - 0.5 mg/dL Final   Indirect Bilirubin  Date Value Ref Range Status  09/12/2015 NOT CALCULATED 0.3 - 0.9 mg/dL Final   Protein, ur  Date Value Ref Range Status  12/31/2021 NEGATIVE NEGATIVE mg/dL Final   Total Protein  Date Value Ref Range Status  12/31/2021 7.4 6.5 - 8.1 g/dL Final  06/04/2020 7.5 6.0 - 8.5 g/dL Final  02/01/2014 8.6 (H) 6.4 - 8.2 g/dL Final   EGFR (African American)  Date Value Ref Range Status  02/01/2014 >60  Final   GFR calc Af Amer  Date Value Ref Range Status  06/04/2020 71 >59 mL/min/1.73 Final    Comment:    **In accordance with recommendations from the NKF-ASN Task force,**   [facility] is in the process of updating its eGFR calculation to the   2021 CKD-EPI creatinine equation that estimates kidney function   without a race variable.    eGFR  Date Value Ref Range Status  12/29/2021 68 >59 mL/min/1.73 Final   EGFR (Non-African Amer.)  Date Value Ref Range Status  02/01/2014 56 (L)  Final    Comment:    eGFR values <97m/min/1.73 m2 may be an indication of  chronic kidney disease (CKD). Calculated eGFR is useful in patients with stable renal function. The eGFR calculation will not be reliable in acutely ill patients when serum creatinine is changing rapidly. It is not useful in  patients on dialysis. The eGFR calculation may not be applicable to patients at the low and high extremes of body sizes, pregnant women, and vegetarians.    GFR, Estimated  Date Value Ref Range Status  12/31/2021 >60 >60 mL/min Final    Comment:    (NOTE) Calculated using the CKD-EPI Creatinine Equation (2021)  Passed - Completed PHQ-2 or PHQ-9 in the last 360 days      Passed - Last Heart Rate in normal range    Pulse Readings from Last 1 Encounters:  01/03/22 81         Passed - Valid encounter within last 6 months    Recent Outpatient Visits          1 week ago Abdominal pain, chronic, generalized   [facility] [name], MD   6 months ago Gastrointestinal hemorrhage, unspecified gastrointestinal hemorrhage type   [facility] [name], MD   1 year ago Benign essential HTN   [facility] [name], MD   2 years ago Benign essential HTN   [facility] [name], MD   2 years ago Benign essential HTN   [facility] [name], MD      Future Appointments            In 4 months [employer] Melia Jesse Sans, MD [facility] [place]           . ondansetron (ZOFRAN) 4 MG tablet [Pharmacy Med Name: ondansetron HCl 4 mg tablet] 270 tablet 0    Sig: NEW PRESCRIPTION REQUEST:: TAKE ONE TABLET BY MOUTH EVERY 8 HOURS AS NEEDED     Not Delegated - Gastroenterology: Antiemetics - ondansetron Failed - 01/05/2022 11:53 AM      Failed - This refill cannot be delegated      Passed - AST in normal range and within 360 days    AST  Date Value Ref Range Status  12/31/2021 23 15 - 41 U/L Final   SGOT(AST)  Date Value Ref Range Status  02/01/2014 22 15 - 37 Unit/L  Final         Passed - ALT in normal range and within 360 days    ALT  Date Value Ref Range Status  12/31/2021 19 0 - 44 U/L Final   SGPT (ALT)  Date Value Ref Range Status  02/01/2014 22 12 - 78 U/L Final         Passed - Valid encounter within last 6 months    Recent Outpatient Visits          1 week ago Abdominal pain, chronic, generalized   [facility] [name], MD   6 months ago Gastrointestinal hemorrhage, unspecified gastrointestinal hemorrhage type   [facility] [name], MD   1 year ago Benign essential HTN   [facility] [name], MD   2 years ago Benign essential HTN   [facility] [name], MD   2 years ago Benign essential HTN   [facility] [name], MD      Future Appointments            In 4 months Army Melia Jesse Sans, 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 pantoprazole (PROTONIX) 40 MG tablet [Pharmacy Med Name: pantoprazole 40 mg tablet,delayed release] 180 tablet 0    Sig: NEW PRESCRIPTION REQUEST:; TAKE ONE TABLET BY MOUTH TWICE DAILY     Gastroenterology: Proton Pump Inhibitors Passed - 01/05/2022 11:53 AM      Passed - Valid encounter within last 12 months    Recent Outpatient Visits          1 week ago Abdominal pain, chronic, generalized   [facility] [name], MD   6 months ago Gastrointestinal hemorrhage, unspecified gastrointestinal hemorrhage type   [facility] [name], MD   1 year ago Benign essential HTN   [facility] [name], MD   2 years ago Benign essential HTN   [facility] [name], MD   2 years ago Benign essential HTN   [facility] [name], MD      Future Appointments            In 4 months Army Melia Jesse Sans, MD [facility]           . meclizine (ANTIVERT) 12.5 MG tablet [Pharmacy Med Name: meclizine 12.5 mg tablet]  90 tablet 0    Sig: NEW PRESCRIPTION REQUEST:; TAKE ONE TABLET BY MOUTH AS NEEDED     Not Delegated - Gastroenterology: Antiemetics Failed - 01/05/2022 11:53 AM      Failed - This refill cannot be delegated      Passed - Valid encounter within last 6 months    Recent Outpatient Visits          1 week ago Abdominal pain, chronic, generalized   [facility] [name], MD   6 months ago Gastrointestinal hemorrhage, unspecified gastrointestinal hemorrhage type   [facility] [name], MD   1 year ago Benign essential HTN   [facility] [name], MD   2 years ago Benign essential HTN   [facility] [name], MD   2 years ago Benign essential HTN   [facility] [name], MD      Future Appointments            In 4 months Army Melia Jesse Sans, MD [facility]           Signed Prescriptions Disp Refills   hydrALAZINE (APRESOLINE) 25 MG tablet 270 tablet 0    Sig: NEW PRESCRIPTION REQUEST:: TAKE ONE TABLET BY MOUTH THREE TIMES DAILY     Cardiovascular:  Vasodilators Failed - 01/05/2022 11:53 AM      Failed - HGB in normal range and within 360 days    Hemoglobin  Date Value Ref Range Status  12/31/2021 11.5 (L) 12.0 - 15.0 g/dL Final  12/29/2021 11.7 11.1 - 15.9 g/dL Final         Failed - ANA Screen, Ifa, Serum in normal range and within 360 days    No results found for: "ANA", "ANATITER", "LABANTI"       Failed - Last BP in normal range    BP Readings from Last 1 Encounters:  01/03/22 (!) 168/76         Passed - HCT in normal range and within 360 days    HCT  Date Value Ref Range Status  12/31/2021 38.3 36.0 - 46.0 % Final   Hematocrit  Date Value Ref Range Status  12/29/2021 37.7 34.0 - 46.6 % Final         Passed - RBC in normal range and within 360 days    RBC  Date Value Ref Range Status  12/31/2021 4.18 3.87 - 5.11 MIL/uL Final         Passed - WBC in  normal range and within 360 days    WBC  Date Value Ref Range Status  12/31/2021 8.4 4.0 - 10.5 K/uL Final  Passed - PLT in normal range and within 360 days    Platelets  Date Value Ref Range Status  12/31/2021 278 150 - 400 K/uL Final  12/29/2021 260 150 - 450 x10E3/uL Final         Passed - Valid encounter within last 12 months    Recent Outpatient Visits          1 week ago Abdominal pain, chronic, generalized   [facility] [name], MD   6 months ago Gastrointestinal hemorrhage, unspecified gastrointestinal hemorrhage type   [facility] [name], MD   1 year ago Benign essential HTN   [facility] [name], MD   2 years ago Benign essential HTN   [facility] [name], MD   2 years ago Benign essential HTN   [facility] [name], MD      Future Appointments            In 4 months Army Melia Jesse Sans, MD [facility]           Refused Prescriptions Disp Refills  . Probiotic Product (RA PROBIOTIC COMPLEX) CAPS [Pharmacy Med Name: Probiotic Complex 25 billion cell-100 mg capsule] 180 capsule 0    Sig: NEW PRESCRIPTION REQUEST:; TAKE ONE CAPSULE BY MOUTH TWICE DAILY     Off-Protocol Failed - 01/05/2022 11:53 AM      Failed - Medication not assigned to a protocol, review manually.      Passed - Valid encounter within last 12 months    Recent Outpatient Visits          1 week ago Abdominal pain, chronic, generalized   [facility] [name], MD   6 months ago Gastrointestinal hemorrhage, unspecified gastrointestinal hemorrhage type   [facility] [name], MD   1 year ago Benign essential HTN   [facility] [name], MD   2 years ago Benign essential HTN   [facility] [name], MD   2 years ago Benign essential HTN   [facility] [name], MD      Future  Appointments            In 4 months Army Melia Jesse Sans, MD [facility] [place]           . STIMULANT LAXATIVE 8.6-50 MG tablet [Pharmacy Med Name: Stimulant Laxative Plus 8.6 mg-50 mg tablet] 90 tablet 0    Sig: NEW PRESCRIPTION REQUEST:; TAKE ONE TABLET BY MOUTH AS NEEDED     Over the Counter:  OTC Passed - 01/05/2022 11:53 AM      Passed - Valid encounter within last 12 months    Recent Outpatient Visits          1 week ago Abdominal pain, chronic, generalized   [facility] [name], MD   6 months ago Gastrointestinal hemorrhage, unspecified gastrointestinal hemorrhage type   [facility] [name], MD   1 year ago Benign essential HTN   [facility] [name], MD   2 years ago Benign essential HTN   [facility] [name], MD   2 years ago Benign essential HTN   [facility] [name], MD      Future Appointments            In 4 months Army Melia Jesse Sans, MD [facility]  [place]

## 2022-01-06 ENCOUNTER — Other Ambulatory Visit: Payer: Self-pay

## 2022-01-07 DIAGNOSIS — K219 Gastro-esophageal reflux disease without esophagitis: Secondary | ICD-10-CM | POA: Diagnosis not present

## 2022-01-07 DIAGNOSIS — I10 Essential (primary) hypertension: Secondary | ICD-10-CM | POA: Diagnosis not present

## 2022-01-07 DIAGNOSIS — R1310 Dysphagia, unspecified: Secondary | ICD-10-CM | POA: Diagnosis not present

## 2022-01-07 DIAGNOSIS — K8689 Other specified diseases of pancreas: Secondary | ICD-10-CM | POA: Diagnosis not present

## 2022-01-07 DIAGNOSIS — Z9181 History of falling: Secondary | ICD-10-CM | POA: Diagnosis not present

## 2022-01-07 DIAGNOSIS — K3184 Gastroparesis: Secondary | ICD-10-CM | POA: Diagnosis not present

## 2022-01-07 DIAGNOSIS — G4733 Obstructive sleep apnea (adult) (pediatric): Secondary | ICD-10-CM | POA: Diagnosis not present

## 2022-01-07 DIAGNOSIS — I951 Orthostatic hypotension: Secondary | ICD-10-CM | POA: Diagnosis not present

## 2022-01-07 DIAGNOSIS — K8020 Calculus of gallbladder without cholecystitis without obstruction: Secondary | ICD-10-CM | POA: Diagnosis not present

## 2022-01-07 DIAGNOSIS — R6250 Unspecified lack of expected normal physiological development in childhood: Secondary | ICD-10-CM | POA: Diagnosis not present

## 2022-01-07 DIAGNOSIS — K5730 Diverticulosis of large intestine without perforation or abscess without bleeding: Secondary | ICD-10-CM | POA: Diagnosis not present

## 2022-01-07 DIAGNOSIS — H8111 Benign paroxysmal vertigo, right ear: Secondary | ICD-10-CM | POA: Diagnosis not present

## 2022-01-07 DIAGNOSIS — E538 Deficiency of other specified B group vitamins: Secondary | ICD-10-CM | POA: Diagnosis not present

## 2022-01-07 DIAGNOSIS — G8929 Other chronic pain: Secondary | ICD-10-CM | POA: Diagnosis not present

## 2022-01-07 DIAGNOSIS — J45909 Unspecified asthma, uncomplicated: Secondary | ICD-10-CM | POA: Diagnosis not present

## 2022-01-11 DIAGNOSIS — R1310 Dysphagia, unspecified: Secondary | ICD-10-CM | POA: Diagnosis not present

## 2022-01-11 DIAGNOSIS — K8020 Calculus of gallbladder without cholecystitis without obstruction: Secondary | ICD-10-CM | POA: Diagnosis not present

## 2022-01-11 DIAGNOSIS — K219 Gastro-esophageal reflux disease without esophagitis: Secondary | ICD-10-CM | POA: Diagnosis not present

## 2022-01-11 DIAGNOSIS — J45909 Unspecified asthma, uncomplicated: Secondary | ICD-10-CM | POA: Diagnosis not present

## 2022-01-11 DIAGNOSIS — I10 Essential (primary) hypertension: Secondary | ICD-10-CM | POA: Diagnosis not present

## 2022-01-11 DIAGNOSIS — I951 Orthostatic hypotension: Secondary | ICD-10-CM | POA: Diagnosis not present

## 2022-01-11 DIAGNOSIS — H8111 Benign paroxysmal vertigo, right ear: Secondary | ICD-10-CM | POA: Diagnosis not present

## 2022-01-11 DIAGNOSIS — Z9181 History of falling: Secondary | ICD-10-CM | POA: Diagnosis not present

## 2022-01-11 DIAGNOSIS — G4733 Obstructive sleep apnea (adult) (pediatric): Secondary | ICD-10-CM | POA: Diagnosis not present

## 2022-01-11 DIAGNOSIS — K3184 Gastroparesis: Secondary | ICD-10-CM | POA: Diagnosis not present

## 2022-01-11 DIAGNOSIS — E538 Deficiency of other specified B group vitamins: Secondary | ICD-10-CM | POA: Diagnosis not present

## 2022-01-11 DIAGNOSIS — R6250 Unspecified lack of expected normal physiological development in childhood: Secondary | ICD-10-CM | POA: Diagnosis not present

## 2022-01-11 DIAGNOSIS — K5730 Diverticulosis of large intestine without perforation or abscess without bleeding: Secondary | ICD-10-CM | POA: Diagnosis not present

## 2022-01-11 DIAGNOSIS — G8929 Other chronic pain: Secondary | ICD-10-CM | POA: Diagnosis not present

## 2022-01-11 DIAGNOSIS — K8689 Other specified diseases of pancreas: Secondary | ICD-10-CM | POA: Diagnosis not present

## 2022-01-13 DIAGNOSIS — K3184 Gastroparesis: Secondary | ICD-10-CM | POA: Diagnosis not present

## 2022-01-13 DIAGNOSIS — I951 Orthostatic hypotension: Secondary | ICD-10-CM | POA: Diagnosis not present

## 2022-01-13 DIAGNOSIS — R1310 Dysphagia, unspecified: Secondary | ICD-10-CM | POA: Diagnosis not present

## 2022-01-13 DIAGNOSIS — I10 Essential (primary) hypertension: Secondary | ICD-10-CM | POA: Diagnosis not present

## 2022-01-13 DIAGNOSIS — K8689 Other specified diseases of pancreas: Secondary | ICD-10-CM | POA: Diagnosis not present

## 2022-01-13 DIAGNOSIS — K5730 Diverticulosis of large intestine without perforation or abscess without bleeding: Secondary | ICD-10-CM | POA: Diagnosis not present

## 2022-01-13 DIAGNOSIS — K219 Gastro-esophageal reflux disease without esophagitis: Secondary | ICD-10-CM | POA: Diagnosis not present

## 2022-01-13 DIAGNOSIS — H8111 Benign paroxysmal vertigo, right ear: Secondary | ICD-10-CM | POA: Diagnosis not present

## 2022-01-13 DIAGNOSIS — R6250 Unspecified lack of expected normal physiological development in childhood: Secondary | ICD-10-CM | POA: Diagnosis not present

## 2022-01-13 DIAGNOSIS — J45909 Unspecified asthma, uncomplicated: Secondary | ICD-10-CM | POA: Diagnosis not present

## 2022-01-13 DIAGNOSIS — G8929 Other chronic pain: Secondary | ICD-10-CM | POA: Diagnosis not present

## 2022-01-13 DIAGNOSIS — Z9181 History of falling: Secondary | ICD-10-CM | POA: Diagnosis not present

## 2022-01-13 DIAGNOSIS — E538 Deficiency of other specified B group vitamins: Secondary | ICD-10-CM | POA: Diagnosis not present

## 2022-01-13 DIAGNOSIS — K8020 Calculus of gallbladder without cholecystitis without obstruction: Secondary | ICD-10-CM | POA: Diagnosis not present

## 2022-01-13 DIAGNOSIS — G4733 Obstructive sleep apnea (adult) (pediatric): Secondary | ICD-10-CM | POA: Diagnosis not present

## 2022-01-20 ENCOUNTER — Other Ambulatory Visit: Payer: Self-pay | Admitting: Internal Medicine

## 2022-01-20 NOTE — Telephone Encounter (Signed)
[facility] pharmacist wanted to know why the Rx refill request she sent over was denied.

## 2022-01-21 ENCOUNTER — Encounter: Payer: Self-pay | Admitting: Internal Medicine

## 2022-01-21 ENCOUNTER — Ambulatory Visit (INDEPENDENT_AMBULATORY_CARE_PROVIDER_SITE_OTHER): Payer: Medicare Other | Admitting: Internal Medicine

## 2022-01-21 VITALS — BP 134/78 | HR 72 | Ht 64.0 in | Wt 224.0 lb

## 2022-01-21 DIAGNOSIS — H6993 Unspecified Eustachian tube disorder, bilateral: Secondary | ICD-10-CM | POA: Diagnosis not present

## 2022-01-21 DIAGNOSIS — F411 Generalized anxiety disorder: Secondary | ICD-10-CM

## 2022-01-21 DIAGNOSIS — R42 Dizziness and giddiness: Secondary | ICD-10-CM | POA: Diagnosis not present

## 2022-01-21 MED ORDER — OLANZAPINE 2.5 MG PO TABS: 2.5000 mg | ORAL_TABLET | Freq: Every day | ORAL | 0 refills | Status: DC

## 2022-01-21 NOTE — Telephone Encounter (Signed)
Requested medication (s) are due for refill today: -  Requested medication (s) are on the active medication list: only probiotics, meclizine, pantoprazole,  Last refill:  olanzaprine: 12/24/11 to 01/22/22 ; Probiotics: last ordered 11/20/21 #30 ;  Stimlax 11/20/21 #30  ;  Meclizine: 01/05/22 #30 ; pantoprazole:12/23/21 #60 1 RF  Future visit scheduled: today 01/21/22 Notes to clinic:  olanzaprine new prescription request; meclizine off protocol    Requested Prescriptions  Pending Prescriptions Disp Refills   OLANZapine (ZYPREXA) 2.5 MG tablet [Pharmacy Med Name: olanzapine 2.5 mg tablet] 90 tablet 0    Sig: NEW PRESCRIPTION REQUEST:: TAKE ONE TABLET BY MOUTH EVERY DAY     Not Delegated - Psychiatry:  Antipsychotics - Second Generation (Atypical) - olanzapine Failed - 01/20/2022  3:30 PM      Failed - This refill cannot be delegated      Failed - TSH in normal range and within 360 days    TSH  Date Value Ref Range Status  06/04/2020 1.150 0.450 - 4.500 uIU/mL Final         Failed - Last BP in normal range    BP Readings from Last 1 Encounters:  01/03/22 (!) 168/76         Failed - Lipid Panel in normal range within the last 12 months    Cholesterol, Total  Date Value Ref Range Status  06/02/2016 157 100 - 199 mg/dL Final   Cholesterol  Date Value Ref Range Status  02/05/2019 163 0 - 200 mg/dL Final   LDL Calculated  Date Value Ref Range Status  06/02/2016 80 0 - 99 mg/dL Final   LDL Cholesterol  Date Value Ref Range Status  02/05/2019 86 0 - 99 mg/dL Final    Comment:           Total Cholesterol/HDL:CHD Risk Coronary Heart Disease Risk Table                     Men   Women  1/2 Average Risk   3.4   3.3  Average Risk       5.0   4.4  2 X Average Risk   9.6   7.1  3 X Average Risk  23.4   11.0        Use the calculated Patient Ratio above and the CHD Risk Table to determine the patient's CHD Risk.        ATP III CLASSIFICATION (LDL):  <100     mg/dL   Optimal  100-129   mg/dL   Near or Above                    Optimal  130-159  mg/dL   Borderline  160-189  mg/dL   High  >190     mg/dL   Very High Performed at [facility], [place], [place] [id number]    HDL  Date Value Ref Range Status  02/05/2019 63 >40 mg/dL Final  06/02/2016 61 >39 mg/dL Final   Triglycerides  Date Value Ref Range Status  02/05/2019 72 <150 mg/dL Final         Passed - Completed PHQ-2 or PHQ-9 in the last 360 days      Passed - Last Heart Rate in normal range    Pulse Readings from Last 1 Encounters:  01/03/22 81         Passed - Valid encounter within last 6 months  Recent Outpatient Visits           3 weeks ago Abdominal pain, chronic, generalized   [facility] [name], MD   7 months ago Gastrointestinal hemorrhage, unspecified gastrointestinal hemorrhage type   [facility] [name], MD   1 year ago Benign essential HTN   [facility] [name], MD   2 years ago Benign essential HTN   [facility] [name], MD   2 years ago Benign essential HTN   [facility] [name], MD       Future Appointments             Today [name], MD [facility], [place]   In 4 months [name], MD [facility]            Passed - CBC within normal limits and completed in the last 12 months    WBC  Date Value Ref Range Status  12/31/2021 8.4 4.0 - 10.5 K/uL Final   RBC  Date Value Ref Range Status  12/31/2021 4.18 3.87 - 5.11 MIL/uL Final   Hemoglobin  Date Value Ref Range Status  12/31/2021 11.5 (L) 12.0 - 15.0 g/dL Final  12/29/2021 11.7 11.1 - 15.9 g/dL Final   HCT  Date Value Ref Range Status  12/31/2021 38.3 36.0 - 46.0 % Final   Hematocrit  Date Value Ref Range Status  12/29/2021 37.7 34.0 - 46.6 % Final   MCHC  Date Value Ref Range Status  12/31/2021 30.0 30.0 - 36.0 g/dL Final   [facility]  Date  Value Ref Range Status  12/31/2021 27.5 26.0 - 34.0 pg Final   MCV  Date Value Ref Range Status  12/31/2021 91.6 80.0 - 100.0 fL Final  12/29/2021 89 79 - 97 fL Final  02/01/2014 90 80 - 100 fL Final   No results found for: "PLTCOUNTKUC", "LABPLAT", "POCPLA" RDW  Date Value Ref Range Status  12/31/2021 18.2 (H) 11.5 - 15.5 % Final  12/29/2021 15.8 (H) 11.7 - 15.4 % Final  02/01/2014 15.9 (H) 11.5 - 14.5 % Final         Passed - CMP within normal limits and completed in the last 12 months    Albumin  Date Value Ref Range Status  12/31/2021 3.6 3.5 - 5.0 g/dL Final  06/04/2020 4.4 3.7 - 4.7 g/dL Final  02/01/2014 3.6 3.4 - 5.0 g/dL Final   Alkaline Phosphatase  Date Value Ref Range Status  12/31/2021 64 38 - 126 U/L Final  02/01/2014 106 Unit/L Final    Comment:    45-117 NOTE: New Reference Range 06/07/13    ALT  Date Value Ref Range Status  12/31/2021 19 0 - 44 U/L Final   SGPT (ALT)  Date Value Ref Range Status  02/01/2014 22 12 - 78 U/L Final   AST  Date Value Ref Range Status  12/31/2021 23 15 - 41 U/L Final   SGOT(AST)  Date Value Ref Range Status  02/01/2014 22 15 - 37 Unit/L Final   BUN  Date Value Ref Range Status  12/31/2021 26 (H) 8 - 23 mg/dL Final  12/29/2021 15 8 - 27 mg/dL Final  02/01/2014 14 7 - 18 mg/dL Final   Calcium  Date Value Ref Range Status  12/31/2021 9.4 8.9 - 10.3 mg/dL Final   Calcium, Total  Date Value Ref Range Status  02/01/2014 9.2 8.5 - 10.1  mg/dL Final   CO2  Date Value Ref Range Status  12/31/2021 28 22 - 32 mmol/L Final   Co2  Date Value Ref Range Status  02/01/2014 29 21 - 32 mmol/L Final   Bicarbonate  Date Value Ref Range Status  01/14/2019 34.0 (H) 20.0 - 28.0 mmol/L Final   Creatinine  Date Value Ref Range Status  02/01/2014 1.04 0.60 - 1.30 mg/dL Final   Creatinine, Ser  Date Value Ref Range Status  12/31/2021 0.98 0.44 - 1.00 mg/dL Final   Glucose  Date Value Ref Range Status  02/01/2014  103 (H) 65 - 99 mg/dL Final   Glucose, Bld  Date Value Ref Range Status  12/31/2021 119 (H) 70 - 99 mg/dL Final    Comment:    Glucose reference range applies only to samples taken after fasting for at least 8 hours.   Glucose-Capillary  Date Value Ref Range Status  01/14/2019 109 (H) 70 - 99 mg/dL Final   Potassium  Date Value Ref Range Status  12/31/2021 4.7 3.5 - 5.1 mmol/L Final    Comment:    HEMOLYSIS AT THIS LEVEL MAY AFFECT RESULT  02/01/2014 3.5 3.5 - 5.1 mmol/L Final   Sodium  Date Value Ref Range Status  12/31/2021 137 135 - 145 mmol/L Final  12/29/2021 140 134 - 144 mmol/L Final  02/01/2014 140 136 - 145 mmol/L Final   Total Bilirubin  Date Value Ref Range Status  12/31/2021 0.6 0.3 - 1.2 mg/dL Final   Bilirubin,Total  Date Value Ref Range Status  02/01/2014 0.2 0.2 - 1.0 mg/dL Final   Bilirubin Total  Date Value Ref Range Status  06/04/2020 0.2 0.0 - 1.2 mg/dL Final   Bilirubin, Direct  Date Value Ref Range Status  09/12/2015 <0.1 (L) 0.1 - 0.5 mg/dL Final   Indirect Bilirubin  Date Value Ref Range Status  09/12/2015 NOT CALCULATED 0.3 - 0.9 mg/dL Final   Protein, ur  Date Value Ref Range Status  12/31/2021 NEGATIVE NEGATIVE mg/dL Final   Total Protein  Date Value Ref Range Status  12/31/2021 7.4 6.5 - 8.1 g/dL Final  06/04/2020 7.5 6.0 - 8.5 g/dL Final  02/01/2014 8.6 (H) 6.4 - 8.2 g/dL Final   EGFR (African American)  Date Value Ref Range Status  02/01/2014 >60  Final   GFR calc Af Amer  Date Value Ref Range Status  06/04/2020 71 >59 mL/min/1.73 Final    Comment:    **In accordance with recommendations from the NKF-ASN Task force,**   [facility] is in the process of updating its eGFR calculation to the   2021 CKD-EPI creatinine equation that estimates kidney function   without a race variable.    eGFR  Date Value Ref Range Status  12/29/2021 68 >59 mL/min/1.73 Final   EGFR (Non-African Amer.)  Date Value Ref Range Status   02/01/2014 56 (L)  Final    Comment:    eGFR values <9m/min/1.73 m2 may be an indication of chronic kidney disease (CKD). Calculated eGFR is useful in patients with stable renal function. The eGFR calculation will not be reliable in acutely ill patients when serum creatinine is changing rapidly. It is not useful in  patients on dialysis. The eGFR calculation may not be applicable to patients at the low and high extremes of body sizes, pregnant women, and vegetarians.    GFR, Estimated  Date Value Ref Range Status  12/31/2021 >60 >60 mL/min Final    Comment:    (NOTE) Calculated using  the CKD-EPI Creatinine Equation (2021)           Probiotic Product (RA PROBIOTIC COMPLEX) CAPS [Pharmacy Med Name: Probiotic Complex 25 billion cell-100 mg capsule] 180 capsule 0    Sig: NEW PRESCRIPTION REQUEST:; TAKE ONE CAPSULE BY MOUTH TWICE DAILY     Off-Protocol Failed - 01/20/2022  3:30 PM      Failed - Medication not assigned to a protocol, review manually.      Passed - Valid encounter within last 12 months    Recent Outpatient Visits           3 weeks ago Abdominal pain, chronic, generalized   [facility] [name], MD   7 months ago Gastrointestinal hemorrhage, unspecified gastrointestinal hemorrhage type   [facility] [name], MD   1 year ago Benign essential HTN   [facility] [name], MD   2 years ago Benign essential HTN   [facility] [name], MD   2 years ago Benign essential HTN   [facility] [name], MD       Future Appointments             Today [name], MD [facility], [place]   In 4 months Army Melia Jesse Sans, MD [facility], [place] 8.6-50 MG tablet [Pharmacy Med Name: Stimulant Laxative Plus 8.6 mg-50 mg tablet] 90 tablet 0    Sig: NEW PRESCRIPTION REQUEST:; TAKE ONE TABLET BY MOUTH AS NEEDED     Over the  Counter:  OTC Passed - 01/20/2022  3:30 PM      Passed - Valid encounter within last 12 months    Recent Outpatient Visits           3 weeks ago Abdominal pain, chronic, generalized   [facility] [name], MD   7 months ago Gastrointestinal hemorrhage, unspecified gastrointestinal hemorrhage type   [facility] [name], MD   1 year ago Benign essential HTN   [facility] [name], MD   2 years ago Benign essential HTN   [facility] [name], MD   2 years ago Benign essential HTN   [facility] [name], MD       Future Appointments             Today [name], MD [facility] [place]   In 4 months Army Melia Jesse Sans, MD [facility]             meclizine (ANTIVERT) 12.5 MG tablet [Pharmacy Med Name: meclizine 12.5 mg tablet] 90 tablet 0    Sig: NEW PRESCRIPTION REQUEST:; TAKE ONE TABLET BY MOUTH AS NEEDED     Not Delegated - Gastroenterology: Antiemetics Failed - 01/20/2022  3:30 PM      Failed - This refill cannot be delegated      Passed - Valid encounter within last 6 months    Recent Outpatient Visits           3 weeks ago Abdominal pain, chronic, generalized   [facility] [name], MD   7 months ago Gastrointestinal hemorrhage, unspecified gastrointestinal hemorrhage type   [facility] [name], MD   1 year ago Benign essential HTN   [facility] [name], MD  2 years ago Benign essential HTN   [facility] [name], MD   2 years ago Benign essential HTN   [facility] [name], MD       Future Appointments             Today [name], MD [facility], [place]   In 4 months Army Melia Jesse Sans, MD [facility]             pantoprazole (PROTONIX) 40 MG tablet [Pharmacy Med Name: pantoprazole 40 mg tablet,delayed  release] 180 tablet 0    Sig: NEW PRESCRIPTION REQUEST:; TAKE ONE TABLET BY MOUTH TWICE DAILY     Gastroenterology: Proton Pump Inhibitors Passed - 01/20/2022  3:30 PM      Passed - Valid encounter within last 12 months    Recent Outpatient Visits           3 weeks ago Abdominal pain, chronic, generalized   [facility] [name], MD   7 months ago Gastrointestinal hemorrhage, unspecified gastrointestinal hemorrhage type   [facility] [name], MD   1 year ago Benign essential HTN   [facility] [name], MD   2 years ago Benign essential HTN   [facility] [name], MD   2 years ago Benign essential HTN   [facility] [name], MD       Future Appointments             Today [name], MD [facility], [place]   In 4 months Army Melia Jesse Sans, MD [facility], [facility]

## 2022-01-21 NOTE — Progress Notes (Signed)
Date:  01/21/2022   Name:  Taylor Johnson   DOB:  [birth year]   MRN:  [id number]   Chief Complaint: Dizziness (Feels like she has been dizzy since she came out of the hospital. Using a cane to help her walk now. )  Dizziness This is a new problem. The current episode started more than 1 month ago. The problem occurs daily. The problem has been waxing and waning. Pertinent negatives include no chills, congestion, coughing, fever, sore throat, vertigo, visual change or vomiting. Nothing aggravates the symptoms. She has tried nothing for the symptoms.    Lab Results  Component Value Date   NA 137 12/31/2021   K 4.7 12/31/2021   CO2 28 12/31/2021   GLUCOSE 119 (H) 12/31/2021   BUN 26 (H) 12/31/2021   CREATININE 0.98 12/31/2021   CALCIUM 9.4 12/31/2021   EGFR 68 12/29/2021   GFRNONAA >60 12/31/2021   Lab Results  Component Value Date   CHOL 163 02/05/2019   HDL 63 02/05/2019   LDLCALC 86 02/05/2019   TRIG 72 02/05/2019   CHOLHDL 2.6 02/05/2019   Lab Results  Component Value Date   TSH 1.150 06/04/2020   Lab Results  Component Value Date   HGBA1C 6.1 (H) 01/31/2017   Lab Results  Component Value Date   WBC 8.4 12/31/2021   HGB 11.5 (L) 12/31/2021   HCT 38.3 12/31/2021   MCV 91.6 12/31/2021   PLT 278 12/31/2021   Lab Results  Component Value Date   ALT 19 12/31/2021   AST 23 12/31/2021   ALKPHOS 64 12/31/2021   BILITOT 0.6 12/31/2021   No results found for: "25OHVITD2", "25OHVITD3", "VD25OH"   Review of Systems  Constitutional:  Negative for chills and fever.  HENT:  Negative for congestion and sore throat.   Respiratory:  Negative for cough.   Gastrointestinal:  Negative for vomiting.  Neurological:  Positive for dizziness and light-headedness. Negative for vertigo.  Psychiatric/Behavioral:  Negative for dysphoric mood and sleep disturbance. The patient is nervous/anxious.     Patient Active Problem List   Diagnosis Date Noted   Abdominal pain, chronic,  generalized 12/16/2021   GI bleeding 06/07/2021   CPAP use counseling 08/10/2020   Morbid obesity ([place]) 08/10/2020   GERD (gastroesophageal reflux disease) 11/07/2019   Dizziness 02/04/2019   Benign essential HTN 10/23/2018   Functional systolic murmur 64/15/8309   Developmental delay disorder 02/06/2017   Calculus of gallbladder without cholecystitis without obstruction 01/20/2017   Localized edema 04/15/2016   Microscopic hematuria 04/15/2016   OSA on CPAP 11/18/2015   Urge incontinence of urine 05/12/2015   Diverticulosis of colon 05/11/2015   Anxiety, generalized 05/11/2015    No Known Allergies  Past Surgical History:  Procedure Laterality Date   ABDOMINAL HYSTERECTOMY     BREAST BIOPSY Left    neg   COLON SURGERY     COLONOSCOPY WITH PROPOFOL N/A 12/13/2019   Procedure: COLONOSCOPY WITH PROPOFOL;  Surgeon: Lin Landsman, MD;  Location: [facility];  Service: Endoscopy;  Laterality: N/A;   ESOPHAGOGASTRODUODENOSCOPY (EGD) WITH PROPOFOL N/A 01/29/2017   Procedure: ESOPHAGOGASTRODUODENOSCOPY (EGD) WITH PROPOFOL;  Surgeon: Wilford Corner, MD;  Location: [facility] ENDOSCOPY;  Service: Endoscopy;  Laterality: N/A;   ESOPHAGOGASTRODUODENOSCOPY (EGD) WITH PROPOFOL N/A 08/02/2021   Procedure: ESOPHAGOGASTRODUODENOSCOPY (EGD) WITH PROPOFOL;  Surgeon: Lin Landsman, MD;  Location[facility];  Service: Gastroenterology;  Laterality: N/A;    Social History   Tobacco Use   Smoking status: Never  Smokeless tobacco: Never   Tobacco comments:    smoking cessation materials not required  Vaping Use   Vaping Use: Never used  Substance Use Topics   Alcohol use: No    Alcohol/week: 0.0 standard drinks of alcohol   Drug use: No     Medication list has been reviewed and updated.  Current Meds  Medication Sig   hydrALAZINE (APRESOLINE) 25 MG tablet NEW PRESCRIPTION REQUEST:: TAKE ONE TABLET BY MOUTH THREE TIMES DAILY   meclizine (ANTIVERT) 12.5 MG tablet TAKE ONE  TABLET BY MOUTH THREE TIMES DAILY AS NEEDED FOR DIZZINESS   metoCLOPramide (REGLAN) 5 MG tablet NEW PRESCRIPTION REQUEST:: TAKE ONE TABLET BY MOUTH THREE TIMES DAILY   OLANZapine (ZYPREXA) 2.5 MG tablet Take 1 tablet (2.5 mg total) by mouth at bedtime.   ondansetron (ZOFRAN) 4 MG tablet NEW PRESCRIPTION REQUEST:: TAKE ONE TABLET BY MOUTH EVERY 8 HOURS AS NEEDED   ondansetron (ZOFRAN-ODT) 4 MG disintegrating tablet Take 1 tablet (4 mg total) by mouth every 8 (eight) hours as needed for nausea or vomiting.   pantoprazole (PROTONIX) 40 MG tablet Take 1 tablet (40 mg total) by mouth 2 (two) times daily.   Saccharomyces boulardii (PROBIOTIC) 250 MG CAPS Take 1 capsule by mouth in the morning and at bedtime.       01/21/2022   11:09 AM 12/29/2021    1:14 PM 06/15/2021    2:05 PM 06/04/2020   10:59 AM  GAD 7 : Generalized Anxiety Score  Nervous, Anxious, on Edge 3 0 0 1  Control/stop worrying 3 1 0 1  Worry too much - different things 3 2 0 1  Trouble relaxing 3 2 0 0  Restless 3 2 0 0  Easily annoyed or irritable 3 2 0 0  Afraid - awful might happen 1 0 0 0  Total GAD 7 Score 19 9 0 3  Anxiety Difficulty Somewhat difficult Somewhat difficult         01/21/2022   11:09 AM 12/29/2021    1:14 PM 06/15/2021    2:04 PM  Depression screen PHQ 2/9  Decreased Interest 1 1 0  Down, Depressed, Hopeless 1 1 0  PHQ - 2 Score 2 2 0  Altered sleeping 1 2 0  Tired, decreased energy 1 0 0  Change in appetite 2 2 3   Feeling bad or failure about yourself  2 1 0  Trouble concentrating 1 0 0  Moving slowly or fidgety/restless 1 2 0  Suicidal thoughts 0 0 0  PHQ-9 Score 10 9 3   Difficult doing work/chores Somewhat difficult Not difficult at all Not difficult at all    BP Readings from Last 3 Encounters:  01/21/22 134/78  01/03/22 (!) 168/76  12/31/21 (!) 134/104    Physical Exam Vitals and nursing note reviewed.  Constitutional:      General: She is not in acute distress.    Appearance: She  is well-developed. She is obese.  HENT:     Head: Normocephalic and atraumatic.     Right Ear: Tympanic membrane, ear canal and external ear normal.     Left Ear: Ear canal and external ear normal. Tympanic membrane is retracted.     Nose:     Right Sinus: No maxillary sinus tenderness or frontal sinus tenderness.     Left Sinus: No maxillary sinus tenderness or frontal sinus tenderness.     Mouth/Throat:     Pharynx: No oropharyngeal exudate or posterior oropharyngeal erythema.  Eyes:  General: Lids are normal.     Comments: Unable to evaluate EOM due to patient lack of cooperation  Pulmonary:     Effort: Pulmonary effort is normal. No respiratory distress.  Skin:    General: Skin is warm and dry.     Findings: No rash.  Neurological:     Mental Status: She is alert and oriented to person, place, and time.  Psychiatric:        Mood and Affect: Mood normal.        Behavior: Behavior normal.     Wt Readings from Last 3 Encounters:  01/21/22 224 lb (101.6 kg)  01/03/22 223 lb (101.2 kg)  12/31/21 226 lb (102.5 kg)    BP 134/78   Pulse 72   Ht 5' 4"  (1.626 m)   Wt 224 lb (101.6 kg)   BMI 38.45 kg/m   Assessment and Plan: 1. Disorder of both eustachian tubes Recommend trial of Claritin 10 mg and Flonase spray daily If no improvement in 1-2 weeks will refer to ENT  2. Dizziness Due to #1  3. Anxiety, generalized Continue zyprexa started last month during hospital stay - OLANZapine (ZYPREXA) 2.5 MG tablet; Take 1 tablet (2.5 mg total) by mouth at bedtime.  Dispense: 30 tablet; Refill: 0   Partially dictated using Editor, commissioning. Any errors are unintentional.  Halina Maidens, MD [facility]  01/21/2022

## 2022-01-21 NOTE — Patient Instructions (Signed)
Claritin 10 mg once a day  Flonase spray 2 sprays in each nostril once a day  Try these for 2 weeks and if still having daily symptoms, call for an ENT referral.

## 2022-01-25 DIAGNOSIS — G4733 Obstructive sleep apnea (adult) (pediatric): Secondary | ICD-10-CM | POA: Diagnosis not present

## 2022-01-25 DIAGNOSIS — K8020 Calculus of gallbladder without cholecystitis without obstruction: Secondary | ICD-10-CM | POA: Diagnosis not present

## 2022-01-25 DIAGNOSIS — R1310 Dysphagia, unspecified: Secondary | ICD-10-CM | POA: Diagnosis not present

## 2022-01-25 DIAGNOSIS — K5730 Diverticulosis of large intestine without perforation or abscess without bleeding: Secondary | ICD-10-CM | POA: Diagnosis not present

## 2022-01-25 DIAGNOSIS — K8689 Other specified diseases of pancreas: Secondary | ICD-10-CM | POA: Diagnosis not present

## 2022-01-25 DIAGNOSIS — K219 Gastro-esophageal reflux disease without esophagitis: Secondary | ICD-10-CM | POA: Diagnosis not present

## 2022-01-25 DIAGNOSIS — R6250 Unspecified lack of expected normal physiological development in childhood: Secondary | ICD-10-CM | POA: Diagnosis not present

## 2022-01-25 DIAGNOSIS — K3184 Gastroparesis: Secondary | ICD-10-CM | POA: Diagnosis not present

## 2022-01-25 DIAGNOSIS — E538 Deficiency of other specified B group vitamins: Secondary | ICD-10-CM | POA: Diagnosis not present

## 2022-01-25 DIAGNOSIS — G8929 Other chronic pain: Secondary | ICD-10-CM | POA: Diagnosis not present

## 2022-01-25 DIAGNOSIS — I951 Orthostatic hypotension: Secondary | ICD-10-CM | POA: Diagnosis not present

## 2022-01-25 DIAGNOSIS — H8111 Benign paroxysmal vertigo, right ear: Secondary | ICD-10-CM | POA: Diagnosis not present

## 2022-01-25 DIAGNOSIS — I10 Essential (primary) hypertension: Secondary | ICD-10-CM | POA: Diagnosis not present

## 2022-01-25 DIAGNOSIS — J45909 Unspecified asthma, uncomplicated: Secondary | ICD-10-CM | POA: Diagnosis not present

## 2022-01-25 DIAGNOSIS — Z9181 History of falling: Secondary | ICD-10-CM | POA: Diagnosis not present

## 2022-01-26 ENCOUNTER — Telehealth: Payer: Self-pay

## 2022-01-26 ENCOUNTER — Other Ambulatory Visit: Payer: Self-pay

## 2022-01-26 DIAGNOSIS — K3184 Gastroparesis: Secondary | ICD-10-CM | POA: Diagnosis not present

## 2022-01-26 DIAGNOSIS — G4733 Obstructive sleep apnea (adult) (pediatric): Secondary | ICD-10-CM | POA: Diagnosis not present

## 2022-01-26 DIAGNOSIS — K8689 Other specified diseases of pancreas: Secondary | ICD-10-CM | POA: Diagnosis not present

## 2022-01-26 DIAGNOSIS — Z9181 History of falling: Secondary | ICD-10-CM | POA: Diagnosis not present

## 2022-01-26 DIAGNOSIS — K219 Gastro-esophageal reflux disease without esophagitis: Secondary | ICD-10-CM | POA: Diagnosis not present

## 2022-01-26 DIAGNOSIS — I10 Essential (primary) hypertension: Secondary | ICD-10-CM | POA: Diagnosis not present

## 2022-01-26 DIAGNOSIS — H8111 Benign paroxysmal vertigo, right ear: Secondary | ICD-10-CM | POA: Diagnosis not present

## 2022-01-26 DIAGNOSIS — E538 Deficiency of other specified B group vitamins: Secondary | ICD-10-CM | POA: Diagnosis not present

## 2022-01-26 DIAGNOSIS — K8020 Calculus of gallbladder without cholecystitis without obstruction: Secondary | ICD-10-CM | POA: Diagnosis not present

## 2022-01-26 DIAGNOSIS — K5730 Diverticulosis of large intestine without perforation or abscess without bleeding: Secondary | ICD-10-CM | POA: Diagnosis not present

## 2022-01-26 DIAGNOSIS — K2970 Gastritis, unspecified, without bleeding: Secondary | ICD-10-CM

## 2022-01-26 DIAGNOSIS — R030 Elevated blood-pressure reading, without diagnosis of hypertension: Secondary | ICD-10-CM

## 2022-01-26 DIAGNOSIS — R1310 Dysphagia, unspecified: Secondary | ICD-10-CM | POA: Diagnosis not present

## 2022-01-26 DIAGNOSIS — I951 Orthostatic hypotension: Secondary | ICD-10-CM | POA: Diagnosis not present

## 2022-01-26 DIAGNOSIS — J45909 Unspecified asthma, uncomplicated: Secondary | ICD-10-CM | POA: Diagnosis not present

## 2022-01-26 DIAGNOSIS — G8929 Other chronic pain: Secondary | ICD-10-CM | POA: Diagnosis not present

## 2022-01-26 DIAGNOSIS — R6250 Unspecified lack of expected normal physiological development in childhood: Secondary | ICD-10-CM | POA: Diagnosis not present

## 2022-01-26 MED ORDER — METOCLOPRAMIDE HCL 5 MG PO TABS: ORAL_TABLET | ORAL | 1 refills | Status: DC

## 2022-01-26 MED ORDER — HYDRALAZINE HCL 25 MG PO TABS: ORAL_TABLET | ORAL | 0 refills | Status: AC

## 2022-01-26 MED ORDER — MECLIZINE HCL 12.5 MG PO TABS: ORAL_TABLET | ORAL | 0 refills | Status: DC

## 2022-01-26 NOTE — Telephone Encounter (Signed)
Patient sister came in clinic saying they are having to change pharmacies due to communication issues. Will not be using [facility].   Was unable to get her Olanzapine filled by [facility]. Wants [name] to send this to [facility] instead.  Thanks   - [employer]

## 2022-01-31 ENCOUNTER — Other Ambulatory Visit: Payer: Self-pay | Admitting: Internal Medicine

## 2022-01-31 DIAGNOSIS — G4733 Obstructive sleep apnea (adult) (pediatric): Secondary | ICD-10-CM | POA: Diagnosis not present

## 2022-01-31 DIAGNOSIS — R1310 Dysphagia, unspecified: Secondary | ICD-10-CM | POA: Diagnosis not present

## 2022-01-31 DIAGNOSIS — G8929 Other chronic pain: Secondary | ICD-10-CM | POA: Diagnosis not present

## 2022-01-31 DIAGNOSIS — K5730 Diverticulosis of large intestine without perforation or abscess without bleeding: Secondary | ICD-10-CM | POA: Diagnosis not present

## 2022-01-31 DIAGNOSIS — F411 Generalized anxiety disorder: Secondary | ICD-10-CM

## 2022-01-31 DIAGNOSIS — E538 Deficiency of other specified B group vitamins: Secondary | ICD-10-CM | POA: Diagnosis not present

## 2022-01-31 DIAGNOSIS — K8020 Calculus of gallbladder without cholecystitis without obstruction: Secondary | ICD-10-CM | POA: Diagnosis not present

## 2022-01-31 DIAGNOSIS — K3184 Gastroparesis: Secondary | ICD-10-CM | POA: Diagnosis not present

## 2022-01-31 DIAGNOSIS — I951 Orthostatic hypotension: Secondary | ICD-10-CM | POA: Diagnosis not present

## 2022-01-31 DIAGNOSIS — K219 Gastro-esophageal reflux disease without esophagitis: Secondary | ICD-10-CM | POA: Diagnosis not present

## 2022-01-31 DIAGNOSIS — H8111 Benign paroxysmal vertigo, right ear: Secondary | ICD-10-CM | POA: Diagnosis not present

## 2022-01-31 DIAGNOSIS — K8689 Other specified diseases of pancreas: Secondary | ICD-10-CM | POA: Diagnosis not present

## 2022-01-31 DIAGNOSIS — I10 Essential (primary) hypertension: Secondary | ICD-10-CM | POA: Diagnosis not present

## 2022-01-31 DIAGNOSIS — R6250 Unspecified lack of expected normal physiological development in childhood: Secondary | ICD-10-CM | POA: Diagnosis not present

## 2022-01-31 DIAGNOSIS — Z9181 History of falling: Secondary | ICD-10-CM | POA: Diagnosis not present

## 2022-01-31 DIAGNOSIS — J45909 Unspecified asthma, uncomplicated: Secondary | ICD-10-CM | POA: Diagnosis not present

## 2022-01-31 MED ORDER — OLANZAPINE 2.5 MG PO TABS: 2.5000 mg | ORAL_TABLET | Freq: Every day | ORAL | 0 refills | Status: DC

## 2022-02-17 ENCOUNTER — Other Ambulatory Visit: Payer: Self-pay | Admitting: Internal Medicine

## 2022-02-17 DIAGNOSIS — F411 Generalized anxiety disorder: Secondary | ICD-10-CM

## 2022-02-17 NOTE — Telephone Encounter (Signed)
Requested medication (s) are due for refill today: yes  Requested medication (s) are on the active medication list: yes  Last refill:  01/31/22 #30/0  Future visit scheduled: yes  Notes to clinic:  not delegated and needs updated labs     Requested Prescriptions  Pending Prescriptions Disp Refills   OLANZapine (ZYPREXA) 2.5 MG tablet [Pharmacy Med Name: OLANZAPINE 2.5MG TABLETS] 30 tablet 0    Sig: TAKE 1 TABLET(2.5 MG) BY MOUTH AT BEDTIME     Not Delegated - Psychiatry:  Antipsychotics - Second Generation (Atypical) - olanzapine Failed - 02/17/2022  3:39 AM      Failed - This refill cannot be delegated      Failed - TSH in normal range and within 360 days    TSH  Date Value Ref Range Status  06/04/2020 1.150 0.450 - 4.500 uIU/mL Final         Failed - Lipid Panel in normal range within the last 12 months    Cholesterol, Total  Date Value Ref Range Status  06/02/2016 157 100 - 199 mg/dL Final   Cholesterol  Date Value Ref Range Status  02/05/2019 163 0 - 200 mg/dL Final   LDL Calculated  Date Value Ref Range Status  06/02/2016 80 0 - 99 mg/dL Final   LDL Cholesterol  Date Value Ref Range Status  02/05/2019 86 0 - 99 mg/dL Final    Comment:           Total Cholesterol/HDL:CHD Risk Coronary Heart Disease Risk Table                     Men   Women  1/2 Average Risk   3.4   3.3  Average Risk       5.0   4.4  2 X Average Risk   9.6   7.1  3 X Average Risk  23.4   11.0        Use the calculated Patient Ratio above and the CHD Risk Table to determine the patient's CHD Risk.        ATP III CLASSIFICATION (LDL):  <100     mg/dL   Optimal  100-129  mg/dL   Near or Above                    Optimal  130-159  mg/dL   Borderline  160-189  mg/dL   High  >190     mg/dL   Very High Performed at [facility], [place], [place] [address]    HDL  Date Value Ref Range Status  02/05/2019 63 >40 mg/dL Final  06/02/2016 61 >39 mg/dL Final    Triglycerides  Date Value Ref Range Status  02/05/2019 72 <150 mg/dL Final         Passed - Completed PHQ-2 or PHQ-9 in the last 360 days      Passed - Last BP in normal range    BP Readings from Last 1 Encounters:  01/21/22 134/78         Passed - Last Heart Rate in normal range    Pulse Readings from Last 1 Encounters:  01/21/22 72         Passed - Valid encounter within last 6 months    Recent Outpatient Visits           3 weeks ago Disorder of both eustachian tubes   [facility] [name], MD  1 month ago Abdominal pain, chronic, generalized   [facility] [name], MD   8 months ago Gastrointestinal hemorrhage, unspecified gastrointestinal hemorrhage type   [facility] [name], MD   1 year ago Benign essential HTN   [facility] [name], MD   2 years ago Benign essential HTN   [facility] [name], MD       Future Appointments             In 3 months [facility] Jesse Sans, MD [facility]            Passed - CBC within normal limits and completed in the last 12 months    WBC  Date Value Ref Range Status  12/31/2021 8.4 4.0 - 10.5 K/uL Final   RBC  Date Value Ref Range Status  12/31/2021 4.18 3.87 - 5.11 MIL/uL Final   Hemoglobin  Date Value Ref Range Status  12/31/2021 11.5 (L) 12.0 - 15.0 g/dL Final  12/29/2021 11.7 11.1 - 15.9 g/dL Final   HCT  Date Value Ref Range Status  12/31/2021 38.3 36.0 - 46.0 % Final   Hematocrit  Date Value Ref Range Status  12/29/2021 37.7 34.0 - 46.6 % Final   MCHC  Date Value Ref Range Status  12/31/2021 30.0 30.0 - 36.0 g/dL Final   [facility]  Date Value Ref Range Status  12/31/2021 27.5 26.0 - 34.0 pg Final   MCV  Date Value Ref Range Status  12/31/2021 91.6 80.0 - 100.0 fL Final  12/29/2021 89 79 - 97 fL Final  02/01/2014 90 80 - 100 fL Final   No results found for: "PLTCOUNTKUC", "LABPLAT",  "POCPLA" RDW  Date Value Ref Range Status  12/31/2021 18.2 (H) 11.5 - 15.5 % Final  12/29/2021 15.8 (H) 11.7 - 15.4 % Final  02/01/2014 15.9 (H) 11.5 - 14.5 % Final         Passed - CMP within normal limits and completed in the last 12 months    Albumin  Date Value Ref Range Status  12/31/2021 3.6 3.5 - 5.0 g/dL Final  06/04/2020 4.4 3.7 - 4.7 g/dL Final  02/01/2014 3.6 3.4 - 5.0 g/dL Final   Alkaline Phosphatase  Date Value Ref Range Status  12/31/2021 64 38 - 126 U/L Final  02/01/2014 106 Unit/L Final    Comment:    45-117 NOTE: New Reference Range 06/07/13    ALT  Date Value Ref Range Status  12/31/2021 19 0 - 44 U/L Final   SGPT (ALT)  Date Value Ref Range Status  02/01/2014 22 12 - 78 U/L Final   AST  Date Value Ref Range Status  12/31/2021 23 15 - 41 U/L Final   SGOT(AST)  Date Value Ref Range Status  02/01/2014 22 15 - 37 Unit/L Final   BUN  Date Value Ref Range Status  12/31/2021 26 (H) 8 - 23 mg/dL Final  12/29/2021 15 8 - 27 mg/dL Final  02/01/2014 14 7 - 18 mg/dL Final   Calcium  Date Value Ref Range Status  12/31/2021 9.4 8.9 - 10.3 mg/dL Final   Calcium, Total  Date Value Ref Range Status  02/01/2014 9.2 8.5 - 10.1 mg/dL Final   CO2  Date Value Ref Range Status  12/31/2021 28 22 - 32 mmol/L Final   Co2  Date Value Ref Range Status  02/01/2014 29 21 - 32 mmol/L Final   Bicarbonate  Date  Value Ref Range Status  01/14/2019 34.0 (H) 20.0 - 28.0 mmol/L Final   Creatinine  Date Value Ref Range Status  02/01/2014 1.04 0.60 - 1.30 mg/dL Final   Creatinine, Ser  Date Value Ref Range Status  12/31/2021 0.98 0.44 - 1.00 mg/dL Final   Glucose  Date Value Ref Range Status  02/01/2014 103 (H) 65 - 99 mg/dL Final   Glucose, Bld  Date Value Ref Range Status  12/31/2021 119 (H) 70 - 99 mg/dL Final    Comment:    Glucose reference range applies only to samples taken after fasting for at least 8 hours.   Glucose-Capillary  Date Value  Ref Range Status  01/14/2019 109 (H) 70 - 99 mg/dL Final   Potassium  Date Value Ref Range Status  12/31/2021 4.7 3.5 - 5.1 mmol/L Final    Comment:    HEMOLYSIS AT THIS LEVEL MAY AFFECT RESULT  02/01/2014 3.5 3.5 - 5.1 mmol/L Final   Sodium  Date Value Ref Range Status  12/31/2021 137 135 - 145 mmol/L Final  12/29/2021 140 134 - 144 mmol/L Final  02/01/2014 140 136 - 145 mmol/L Final   Total Bilirubin  Date Value Ref Range Status  12/31/2021 0.6 0.3 - 1.2 mg/dL Final   Bilirubin,Total  Date Value Ref Range Status  02/01/2014 0.2 0.2 - 1.0 mg/dL Final   Bilirubin Total  Date Value Ref Range Status  06/04/2020 0.2 0.0 - 1.2 mg/dL Final   Bilirubin, Direct  Date Value Ref Range Status  09/12/2015 <0.1 (L) 0.1 - 0.5 mg/dL Final   Indirect Bilirubin  Date Value Ref Range Status  09/12/2015 NOT CALCULATED 0.3 - 0.9 mg/dL Final   Protein, ur  Date Value Ref Range Status  12/31/2021 NEGATIVE NEGATIVE mg/dL Final   Total Protein  Date Value Ref Range Status  12/31/2021 7.4 6.5 - 8.1 g/dL Final  06/04/2020 7.5 6.0 - 8.5 g/dL Final  02/01/2014 8.6 (H) 6.4 - 8.2 g/dL Final   EGFR (African American)  Date Value Ref Range Status  02/01/2014 >60  Final   GFR calc Af Amer  Date Value Ref Range Status  06/04/2020 71 >59 mL/min/1.73 Final    Comment:    **In accordance with recommendations from the NKF-ASN Task force,**   [facility] is in the process of updating its eGFR calculation to the   2021 CKD-EPI creatinine equation that estimates kidney function   without a race variable.    eGFR  Date Value Ref Range Status  12/29/2021 68 >59 mL/min/1.73 Final   EGFR (Non-African Amer.)  Date Value Ref Range Status  02/01/2014 56 (L)  Final    Comment:    eGFR values <75m/min/1.73 m2 may be an indication of chronic kidney disease (CKD). Calculated eGFR is useful in patients with stable renal function. The eGFR calculation will not be reliable in acutely ill patients when  serum creatinine is changing rapidly. It is not useful in  patients on dialysis. The eGFR calculation may not be applicable to patients at the low and high extremes of body sizes, pregnant women, and vegetarians.    GFR, Estimated  Date Value Ref Range Status  12/31/2021 >60 >60 mL/min Final    Comment:    (NOTE) Calculated using the CKD-EPI Creatinine Equation (2021)

## 2022-02-28 ENCOUNTER — Other Ambulatory Visit: Payer: Self-pay | Admitting: Internal Medicine

## 2022-02-28 ENCOUNTER — Telehealth: Payer: Self-pay | Admitting: *Deleted

## 2022-02-28 ENCOUNTER — Ambulatory Visit (INDEPENDENT_AMBULATORY_CARE_PROVIDER_SITE_OTHER): Payer: Medicare Other | Admitting: Primary Care

## 2022-02-28 ENCOUNTER — Ambulatory Visit
Admission: RE | Admit: 2022-02-28 | Discharge: 2022-02-28 | Disposition: A | Discharge status: Home / Self Care | Payer: Medicare Other | Source: Ambulatory Visit | Attending: Primary Care | Admitting: Primary Care

## 2022-02-28 ENCOUNTER — Other Ambulatory Visit
Admission: RE | Admit: 2022-02-28 | Discharge: 2022-02-28 | Disposition: A | Discharge status: Home / Self Care | Payer: Medicare Other | Source: Home / Self Care | Attending: Primary Care | Admitting: Primary Care

## 2022-02-28 ENCOUNTER — Ambulatory Visit
Admission: RE | Admit: 2022-02-28 | Discharge: 2022-02-28 | Disposition: A | Discharge status: Home / Self Care | Payer: Medicare Other | Attending: Primary Care | Admitting: Primary Care

## 2022-02-28 ENCOUNTER — Encounter: Payer: Self-pay | Admitting: Primary Care

## 2022-02-28 VITALS — BP 130/70 | HR 61 | Temp 97.5°F | Ht 64.0 in | Wt 223.4 lb

## 2022-02-28 DIAGNOSIS — J811 Chronic pulmonary edema: Secondary | ICD-10-CM | POA: Diagnosis not present

## 2022-02-28 DIAGNOSIS — Z9989 Dependence on other enabling machines and devices: Secondary | ICD-10-CM

## 2022-02-28 DIAGNOSIS — R0609 Other forms of dyspnea: Secondary | ICD-10-CM | POA: Diagnosis not present

## 2022-02-28 DIAGNOSIS — G4733 Obstructive sleep apnea (adult) (pediatric): Secondary | ICD-10-CM | POA: Diagnosis not present

## 2022-02-28 DIAGNOSIS — J9811 Atelectasis: Secondary | ICD-10-CM | POA: Diagnosis not present

## 2022-02-28 DIAGNOSIS — R0600 Dyspnea, unspecified: Secondary | ICD-10-CM | POA: Diagnosis not present

## 2022-02-28 LAB — CBC
HCT: 38 % (ref 36.0–46.0)
Hemoglobin: 11.3 g/dL — ABNORMAL LOW (ref 12.0–15.0)
MCH: 27.9 pg (ref 26.0–34.0)
MCHC: 29.7 g/dL — ABNORMAL LOW (ref 30.0–36.0)
MCV: 93.8 fL (ref 80.0–100.0)
Platelets: 212 10*3/uL (ref 150–400)
RBC: 4.05 MIL/uL (ref 3.87–5.11)
RDW: 15.9 % — ABNORMAL HIGH (ref 11.5–15.5)
WBC: 5.9 10*3/uL (ref 4.0–10.5)
nRBC: 0 % (ref 0.0–0.2)

## 2022-02-28 LAB — BASIC METABOLIC PANEL
Anion gap: 6 (ref 5–15)
BUN: 21 mg/dL (ref 8–23)
CO2: 28 mmol/L (ref 22–32)
Calcium: 9.4 mg/dL (ref 8.9–10.3)
Chloride: 104 mmol/L (ref 98–111)
Creatinine, Ser: 0.94 mg/dL (ref 0.44–1.00)
GFR, Estimated: >60 mL/min (ref >60)
Glucose, Bld: 91 mg/dL (ref 70–99)
Potassium: 3.9 mmol/L (ref 3.5–5.1)
Sodium: 138 mmol/L (ref 135–145)

## 2022-02-28 LAB — BRAIN NATRIURETIC PEPTIDE: B Natriuretic Peptide: 98 pg/mL (ref 0.0–100.0)

## 2022-02-28 NOTE — Assessment & Plan Note (Signed)
-   History of sleep apnea.  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  She reports compliance with CPAP.  Previous pressure settings were 8 cm H2O.  She is having issues with full face mask fit.  Current DME company is no longer excepting her insurance. Requesting CPAP compliance report from [employer] for review. We will place an order for her to have a mask fitting with new DME company.

## 2022-02-28 NOTE — Patient Instructions (Addendum)
We will place an order for you to have a mask fitting with [employer] company Have also requested a download from your CPAP machine to look at current pressure settings to ensure you are having no residual apneas  Get labs and chest x-ray today for symptoms of shortness of breath. If these are normal no further work-up needed at this time.  If abnormal we will call you with results and next steps.  Recommendations: Continue to wear CPAP every night for minimum 4 to 6 hours or longer  Orders: Labs and CXR today (ordered) Mask fitting with new DME company   Follow-up: 3 months with [facility] NP or sooner if needed   CPAP and BIPAP Information CPAP and BIPAP are methods that use air pressure to keep your airways open and to help you breathe well. CPAP and BIPAP use different amounts of pressure. Your health care provider will tell you whether CPAP or BIPAP would be more helpful for you. CPAP stands for "continuous positive airway pressure." With CPAP, the amount of pressure stays the same while you breathe in (inhale) and out (exhale). BIPAP stands for "bi-level positive airway pressure." With BIPAP, the amount of pressure will be higher when you inhale and lower when you exhale. This allows you to take larger breaths. CPAP or BIPAP may be used in the hospital, or your health care provider may want you to use it at home. You may need to have a sleep study before your health care provider can order a machine for you to use at home. What are the advantages? CPAP or BIPAP can be helpful if you have: Sleep apnea. Chronic obstructive pulmonary disease (COPD). Heart failure. Medical conditions that cause muscle weakness, including muscular dystrophy or amyotrophic lateral sclerosis (ALS). Other problems that cause breathing to be shallow, weak, abnormal, or difficult. CPAP and BIPAP are most commonly used for obstructive sleep apnea (OSA) to keep the airways from collapsing when  the muscles relax during sleep. What are the risks? Generally, this is a safe treatment. However, problems may occur, including: Irritated skin or skin sores if the mask does not fit properly. Dry or stuffy nose or nosebleeds. Dry mouth. Feeling gassy or bloated. Sinus or lung infection if the equipment is not cleaned properly. When should CPAP or BIPAP be used? In most cases, the mask only needs to be worn during sleep. Generally, the mask needs to be worn throughout the night and during any daytime naps. People with certain medical conditions may also need to wear the mask at other times, such as when they are awake. Follow instructions from your health care provider about when to use the machine. What happens during CPAP or BIPAP?  Both CPAP and BIPAP are provided by a small machine with a flexible plastic tube that attaches to a plastic mask that you wear. Air is blown through the mask into your nose or mouth. The amount of pressure that is used to blow the air can be adjusted on the machine. Your health care provider will set the pressure setting and help you find the best mask for you. Tips for using the mask Because the mask needs to be snug, some people feel trapped or closed-in (claustrophobic) when first using the mask. If you feel this way, you may need to get used to the mask. One way to do this is to hold the mask loosely over your nose or mouth and then gradually apply the mask more snugly.  You can also gradually increase the amount of time that you use the mask. Masks are available in various types and sizes. If your mask does not fit well, talk with your health care provider about getting a different one. Some common types of masks include: Full face masks, which fit over the mouth and nose. Nasal masks, which fit over the nose. Nasal pillow or prong masks, which fit into the nostrils. If you are using a mask that fits over your nose and you tend to breathe through your mouth, a  chin strap may be applied to help keep your mouth closed. Use a skin barrier to protect your skin as told by your health care provider. Some CPAP and BIPAP machines have alarms that may sound if the mask comes off or develops a leak. If you have trouble with the mask, it is very important that you talk with your health care provider about finding a way to make the mask easier to tolerate. Do not stop using the mask. There could be a negative impact on your health if you stop using the mask. Tips for using the machine Place your CPAP or BIPAP machine on a secure table or stand near an electrical outlet. Know where the on/off switch is on the machine. Follow instructions from your health care provider about how to set the pressure on your machine and when you should use it. Do not eat or drink while the CPAP or BIPAP machine is on. Food or fluids could get pushed into your lungs by the pressure of the CPAP or BIPAP. For home use, CPAP and BIPAP machines can be rented or purchased through home health care companies. Many different brands of machines are available. Renting a machine before purchasing may help you find out which particular machine works well for you. Your health insurance company may also decide which machine you may get. Keep the CPAP or BIPAP machine and attachments clean. Ask your health care provider for specific instructions. Check the humidifier if you have a dry stuffy nose or nosebleeds. Make sure it is working correctly. Follow these instructions at home: Take over-the-counter and prescription medicines only as told by your health care provider. Ask if you can take sinus medicine if your sinuses are blocked. Do not use any products that contain nicotine or tobacco. These products include cigarettes, chewing tobacco, and vaping devices, such as e-cigarettes. If you need help quitting, ask your health care provider. Keep all follow-up visits. This is important. Contact a health  care provider if: You have redness or pressure sores on your head, face, mouth, or nose from the mask or head gear. You have trouble using the CPAP or BIPAP machine. You cannot tolerate wearing the CPAP or BIPAP mask. Someone tells you that you snore even when wearing your CPAP or BIPAP. Get help right away if: You have trouble breathing. You feel confused. Summary CPAP and BIPAP are methods that use air pressure to keep your airways open and to help you breathe well. If you have trouble with the mask, it is very important that you talk with your health care provider about finding a way to make the mask easier to tolerate. Do not stop using the mask. There could be a negative impact to your health if you stop using the mask. Follow instructions from your health care provider about when to use the machine. This information is not intended to replace advice given to you by your health care provider. Make  sure you discuss any questions you have with your health care provider. Document Revised: 02/10/2021 Document Reviewed: 06/12/2020 Elsevier Patient Education  [employer].

## 2022-02-28 NOTE — Telephone Encounter (Signed)
Called and left VM at [facility] .  We need a copy of her sleep study and a compliance report faxed to [phone].  Will await return call/fax.

## 2022-02-28 NOTE — Telephone Encounter (Unsigned)
Copied from [place] [phone]. Topic: General - Other >> Feb 28, 2022 11:41 AM Everette C wrote: Reason for CRM: Medication Refill - Medication: meclizine (ANTIVERT) 12.5 MG tablet [[id number]]   Has the patient contacted their pharmacy? Yes.  The patient's sister was directed to contact their PCP (Agent: If no, request that the patient contact the pharmacy for the refill. If patient does not wish to contact the pharmacy document the reason why and proceed with request.) (Agent: If yes, when and what did the pharmacy advise?)  Preferred Pharmacy (with phone number or street name): [facility], [place] [address] [place] [address] Phone: [phone] Fax: [phone] Hours: Not open 24 hours   Has the patient been seen for an appointment in the last year OR does the patient have an upcoming appointment? Yes.    Agent: Please be advised that RX refills may take up to 3 business days. We ask that you follow-up with your pharmacy.

## 2022-02-28 NOTE — Progress Notes (Signed)
$'@Patient'q$  ID: Taylor Johnson, female    DOB: Feb 06, 1946, 76 y.o.   MRN: [id number]  Chief Complaint  Patient presents with   Consult    Referring provider: [name], MD  HPI: 76 year old female, never smoked.  Past medical history significant for hypertension, OSA on CPAP, GERD, obesity.  02/28/2022 Patient presents today for new sleep consult.  She is currently on CPAP for history of obstructive sleep apnea.  She had a sleep study 03/16/2017 which showed evidence of mild obstructive sleep apnea, total AHI was 5.1/hr (events predominantly occurring during REM 21.1/hr).   Accompanied by her sister. She has been having issues with current full face mask. She has a hard time getting her mask on at night, needs something more simple. She is not having any issues with current pressure settings. Overall she is sleeping ok at night, feels for the most part well rested in the morning when she wakes up. She will occasionally take a nap mid day. Previous CPAP pressure was 8 cm H2O. She is looking to get new mask. States that current DME company does not take her insurance. She is established with [employer].  She also reports shortness of breath when walking to the mail box. She has a dry non-productive cough. She has no shortness of breath when laying flat at night or when at rest. Denies F/C/S, chest tightness or wheezing.   Sleep questionnaire Symptoms-  Hx sleep apnea, current on CPAP. Symptoms of loud snoring, daytime sleepiness Prior sleep study- August 2018 Bedtime- 10pm Time to fall asleep- Unsure  Nocturnal awakenings- three times Out of bed/start of day- 6am  Weight changes- None Do you operate heavy machinery- No Do you currently wear CPAP- Yes Do you current wear oxygen- No Epworth- 10 Medications- Zyprexa 2.'5mg'$  at bedtime   No Known Allergies  Immunization History  Administered Date(s) Administered   Influenza Inj Mdck Quad Pf 05/08/2019   Influenza, High Dose  Seasonal PF 04/13/2018, 04/24/2020, 05/02/2021   Influenza,inj,Quad PF,6+ Mos 05/12/2015, 04/05/2016, 03/16/2017   Influenza-Unspecified 05/10/2019   Moderna Covid-19 Vaccine Bivalent Booster 35yr & up 05/06/2021   Moderna SARS-COV2 Booster Vaccination 06/30/2020   Moderna Sars-Covid-2 Vaccination 08/22/2019, 09/19/2019   Pneumococcal Conjugate-13 04/05/2016   Pneumococcal Polysaccharide-23 01/06/2014, 09/13/2015   Zoster Recombinat (Shingrix) 05/08/2019    Past Medical History:  Diagnosis Date   Abdominal pain 01/27/2017   Anxiety    Anxiety, generalized 05/11/2015   Cough 06/02/2016   Chronic - followed by Pulmonary   Cough 06/02/2016   Chronic - followed by Pulmonary   Depression    Developmental delay    Diverticulitis    Diverticulosis of colon 05/11/2015   Dysphagia 10/15/2015   Routine Ba Swallow normal; rec modified barium swallow    GERD (gastroesophageal reflux disease)    Hematochezia 11/07/2019   Hypertension    Hypoxia 09/12/2015   Leg swelling    Localized edema 04/15/2016   Microscopic hematuria 04/15/2016   Nausea and vomiting 01/29/2017   Orthostatic hypotension 03/16/2017   OSA on CPAP 11/18/2015   CPAP @ 18 cm H2O started 02/2016   Rectal bleeding 10/15/2018   Urge incontinence of urine 05/12/2015    Tobacco History: Social History   Tobacco Use  Smoking Status Never  Smokeless Tobacco Never  Tobacco Comments   smoking cessation materials not required   Counseling given: Not Answered Tobacco comments: smoking cessation materials not required   Outpatient Medications Prior to Visit  Medication Sig Dispense  Refill   hydrALAZINE (APRESOLINE) 25 MG tablet NEW PRESCRIPTION REQUEST:: TAKE ONE TABLET BY MOUTH THREE TIMES DAILY 270 tablet 0   meclizine (ANTIVERT) 12.5 MG tablet TAKE ONE TABLET BY MOUTH THREE TIMES DAILY AS NEEDED FOR DIZZINESS 30 tablet 0   metoCLOPramide (REGLAN) 5 MG tablet NEW PRESCRIPTION REQUEST:: TAKE ONE TABLET BY MOUTH  THREE TIMES DAILY 90 tablet 1   OLANZapine (ZYPREXA) 2.5 MG tablet Take 1 tablet (2.5 mg total) by mouth at bedtime. 30 tablet 0   ondansetron (ZOFRAN) 4 MG tablet NEW PRESCRIPTION REQUEST:: TAKE ONE TABLET BY MOUTH EVERY 8 HOURS AS NEEDED 20 tablet 0   pantoprazole (PROTONIX) 40 MG tablet Take 1 tablet (40 mg total) by mouth 2 (two) times daily. 60 tablet 1   ondansetron (ZOFRAN-ODT) 4 MG disintegrating tablet Take 1 tablet (4 mg total) by mouth every 8 (eight) hours as needed for nausea or vomiting. (Patient not taking: Reported on 02/28/2022) 20 tablet 0   Saccharomyces boulardii (PROBIOTIC) 250 MG CAPS Take 1 capsule by mouth in the morning and at bedtime. (Patient not taking: Reported on 02/28/2022) 30 capsule 0   No facility-administered medications prior to visit.   Review of Systems  Review of Systems  Constitutional: Negative.   HENT: Negative.    Respiratory:  Positive for cough and shortness of breath. Negative for chest tightness and wheezing.   Cardiovascular:  Positive for leg swelling.  Psychiatric/Behavioral:  Positive for sleep disturbance.     Physical Exam  BP 130/70 (BP Location: Left Arm, Patient Position: Sitting, Cuff Size: Large)   Pulse 61   Temp (!) 97.5 F (36.4 C) (Oral)   Ht '5\' 4"'$  (1.626 m)   Wt 223 lb 6.4 oz (101.3 kg)   SpO2 97%   BMI 38.35 kg/m  Physical Exam Constitutional:      General: She is not in acute distress.    Appearance: Normal appearance. She is not ill-appearing.  HENT:     Head: Normocephalic and atraumatic.     Mouth/Throat:     Mouth: Mucous membranes are moist.     Pharynx: Oropharynx is clear.  Cardiovascular:     Rate and Rhythm: Normal rate.     Comments: ? Murmur heard Pulmonary:     Effort: Pulmonary effort is normal.     Breath sounds: Normal breath sounds. No wheezing.     Comments: ? Distant rales bases Musculoskeletal:        General: Normal range of motion.  Skin:    General: Skin is warm and dry.   Neurological:     General: No focal deficit present.     Mental Status: She is alert and oriented to person, place, and time. Mental status is at baseline.  Psychiatric:        Mood and Affect: Mood normal.        Behavior: Behavior normal.        Thought Content: Thought content normal.        Judgment: Judgment normal.      Lab Results:  CBC    Component Value Date/Time   WBC 8.4 12/31/2021 1530   RBC 4.18 12/31/2021 1530   HGB 11.5 (L) 12/31/2021 1530   HGB 11.7 12/29/2021 1340   HCT 38.3 12/31/2021 1530   HCT 37.7 12/29/2021 1340   PLT 278 12/31/2021 1530   PLT 260 12/29/2021 1340   MCV 91.6 12/31/2021 1530   MCV 89 12/29/2021 1340   MCV 90 02/01/2014 2220  MCH 27.5 12/31/2021 1530   MCHC 30.0 12/31/2021 1530   RDW 18.2 (H) 12/31/2021 1530   RDW 15.8 (H) 12/29/2021 1340   RDW 15.9 (H) 02/01/2014 2220   LYMPHSABS 1.9 12/29/2021 1340   LYMPHSABS 2.1 02/01/2014 2220   MONOABS 0.5 12/09/2021 2144   MONOABS 0.7 02/01/2014 2220   EOSABS 0.3 12/29/2021 1340   EOSABS 0.3 02/01/2014 2220   BASOSABS 0.0 12/29/2021 1340   BASOSABS 0.0 02/01/2014 2220    BMET    Component Value Date/Time   NA 137 12/31/2021 1530   NA 140 12/29/2021 1340   NA 140 02/01/2014 2220   K 4.7 12/31/2021 1530   K 3.5 02/01/2014 2220   CL 101 12/31/2021 1530   CL 105 02/01/2014 2220   CO2 28 12/31/2021 1530   CO2 29 02/01/2014 2220   GLUCOSE 119 (H) 12/31/2021 1530   GLUCOSE 103 (H) 02/01/2014 2220   BUN 26 (H) 12/31/2021 1530   BUN 15 12/29/2021 1340   BUN 14 02/01/2014 2220   CREATININE 0.98 12/31/2021 1530   CREATININE 1.04 02/01/2014 2220   CALCIUM 9.4 12/31/2021 1530   CALCIUM 9.2 02/01/2014 2220   GFRNONAA >60 12/31/2021 1530   GFRNONAA 56 (L) 02/01/2014 2220   GFRAA 71 06/04/2020 1140   GFRAA >60 02/01/2014 2220    BNP No results found for: "BNP"  ProBNP No results found for: "PROBNP"  Imaging: No results found.   Assessment & Plan:   OSA on CPAP - History of  sleep apnea.  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  She reports compliance with CPAP.  Previous pressure settings were 8 cm H2O.  She is having issues with full face mask fit.  Current DME company is no longer excepting her insurance. Requesting CPAP compliance report from [employer] for review. We will place an order for her to have a mask fitting with new DME company.   Dyspnea on exertion - Patient reports symptoms of mild dyspnea with exertion. She is a never smoker. Associated dry nonproductive cough.  Denies shortness breath at rest, chest tightness or wheezing. She had a faint murmur on exam today along with leg swelling, consider getting echocardiogram/referral to cardiology. We will check basic labs and chest x-ray today.     Martyn Ehrich, NP 02/28/2022

## 2022-02-28 NOTE — Assessment & Plan Note (Signed)
-   Patient reports symptoms of mild dyspnea with exertion. She is a never smoker. Associated dry nonproductive cough.  Denies shortness breath at rest, chest tightness or wheezing. She had a faint murmur on exam today along with leg swelling, consider getting echocardiogram/referral to cardiology. We will check basic labs and chest x-ray today.

## 2022-03-01 MED ORDER — MECLIZINE HCL 12.5 MG PO TABS: ORAL_TABLET | ORAL | 0 refills | Status: DC

## 2022-03-01 NOTE — Progress Notes (Signed)
Please let patient know CXR showed cardiomegaly and pulmonary vascular congestion. Not urgent findings but dyspnea could be related to the heart. Please order echocardiogram re: dyspnea

## 2022-03-01 NOTE — Telephone Encounter (Signed)
Requested medication (s) are due for refill today: yes  Requested medication (s) are on the active medication list: yes  Last refill:  01/26/22 #30 with 0 RF  Future visit scheduled: 03/04/22, seen 01/21/22  Notes to clinic:  This medication can not be delegated, please assess.        Requested Prescriptions  Pending Prescriptions Disp Refills   meclizine (ANTIVERT) 12.5 MG tablet 30 tablet 0    Sig: TAKE ONE TABLET BY MOUTH THREE TIMES DAILY AS NEEDED FOR DIZZINESS     Not Delegated - Gastroenterology: Antiemetics Failed - 02/28/2022 11:59 AM      Failed - This refill cannot be delegated      Passed - Valid encounter within last 6 months    Recent Outpatient Visits           1 month ago Disorder of both eustachian tubes   [facility] [name], MD   2 months ago Abdominal pain, chronic, generalized   [facility] [name], MD   8 months ago Gastrointestinal hemorrhage, unspecified gastrointestinal hemorrhage type   [facility] [name], MD   1 year ago Benign essential HTN   [facility] [name], MD   2 years ago Benign essential HTN   [facility] [name], MD       Future Appointments             In 3 days [name], MD [facility], [place]   In 2 months Army Melia, Jesse Sans, MD [facility], [facility]

## 2022-03-04 ENCOUNTER — Ambulatory Visit (INDEPENDENT_AMBULATORY_CARE_PROVIDER_SITE_OTHER): Payer: Medicare Other | Admitting: Internal Medicine

## 2022-03-04 ENCOUNTER — Telehealth: Payer: Self-pay | Admitting: Primary Care

## 2022-03-04 ENCOUNTER — Encounter: Payer: Self-pay | Admitting: Internal Medicine

## 2022-03-04 VITALS — BP 130/82 | HR 60 | Ht 64.0 in | Wt 222.0 lb

## 2022-03-04 DIAGNOSIS — D485 Neoplasm of uncertain behavior of skin: Secondary | ICD-10-CM

## 2022-03-04 NOTE — Patient Instructions (Signed)
[facility] in [place] in [place]

## 2022-03-04 NOTE — Progress Notes (Signed)
Date:  03/04/2022   Name:  Taylor Johnson   DOB:  09-01-1945   MRN:  [id number]   Chief Complaint: Skin Spots (Started 2 weeks ago. Noticed a spot in her scalp, and a spot on her neck. Wants to see if needs referral to dermatology.)  HPI Skin lesions:   One on her scalp and one on her neck.  Noticed 2 weeks ago.  No pain or bleeding.  The place on her scalp is now flat.  She has no other lesions and does not have a dermatologist.   Lab Results  Component Value Date   NA 138 02/28/2022   K 3.9 02/28/2022   CO2 28 02/28/2022   GLUCOSE 91 02/28/2022   BUN 21 02/28/2022   CREATININE 0.94 02/28/2022   CALCIUM 9.4 02/28/2022   EGFR 68 12/29/2021   GFRNONAA >60 02/28/2022   Lab Results  Component Value Date   CHOL 163 02/05/2019   HDL 63 02/05/2019   LDLCALC 86 02/05/2019   TRIG 72 02/05/2019   CHOLHDL 2.6 02/05/2019   Lab Results  Component Value Date   TSH 1.150 06/04/2020   Lab Results  Component Value Date   HGBA1C 6.1 (H) 01/31/2017   Lab Results  Component Value Date   WBC 5.9 02/28/2022   HGB 11.3 (L) 02/28/2022   HCT 38.0 02/28/2022   MCV 93.8 02/28/2022   PLT 212 02/28/2022   Lab Results  Component Value Date   ALT 19 12/31/2021   AST 23 12/31/2021   ALKPHOS 64 12/31/2021   BILITOT 0.6 12/31/2021   No results found for: "25OHVITD2", "25OHVITD3", "VD25OH"   Review of Systems  Constitutional:  Negative for fatigue and unexpected weight change.  HENT:  Negative for nosebleeds.   Eyes:  Negative for visual disturbance.  Respiratory:  Negative for cough, chest tightness, shortness of breath and wheezing.   Cardiovascular:  Negative for chest pain, palpitations and leg swelling.  Gastrointestinal:  Negative for abdominal pain, constipation and diarrhea.  Skin:        Skin lesions  Neurological:  Negative for dizziness, weakness, light-headedness and headaches.    Patient Active Problem List   Diagnosis Date Noted   Dyspnea on exertion 02/28/2022    Abdominal pain, chronic, generalized 12/16/2021   GI bleeding 06/07/2021   CPAP use counseling 08/10/2020   Morbid obesity ([place]) 08/10/2020   GERD (gastroesophageal reflux disease) 11/07/2019   Dizziness 02/04/2019   Benign essential HTN 10/23/2018   Functional systolic murmur 03/00/9233   Developmental delay disorder 02/06/2017   Calculus of gallbladder without cholecystitis without obstruction 01/20/2017   Localized edema 04/15/2016   Microscopic hematuria 04/15/2016   OSA on CPAP 11/18/2015   Urge incontinence of urine 05/12/2015   Diverticulosis of colon 05/11/2015   Anxiety, generalized 05/11/2015    No Known Allergies  Past Surgical History:  Procedure Laterality Date   ABDOMINAL HYSTERECTOMY     BREAST BIOPSY Left    neg   COLON SURGERY     COLONOSCOPY WITH PROPOFOL N/A 12/13/2019   Procedure: COLONOSCOPY WITH PROPOFOL;  Surgeon: Lin Landsman, MD;  Location: [place];  Service: Endoscopy;  Laterality: N/A;   ESOPHAGOGASTRODUODENOSCOPY (EGD) WITH PROPOFOL N/A 01/29/2017   Procedure: ESOPHAGOGASTRODUODENOSCOPY (EGD) WITH PROPOFOL;  Surgeon: Wilford Corner, MD;  Location: [facility] ENDOSCOPY;  Service: Endoscopy;  Laterality: N/A;   ESOPHAGOGASTRODUODENOSCOPY (EGD) WITH PROPOFOL N/A 08/02/2021   Procedure: ESOPHAGOGASTRODUODENOSCOPY (EGD) WITH PROPOFOL;  Surgeon: Lin Landsman, MD;  Location:  [facility] ENDOSCOPY;  Service: Gastroenterology;  Laterality: N/A;    Social History   Tobacco Use   Smoking status: Never   Smokeless tobacco: Never   Tobacco comments:    smoking cessation materials not required  Vaping Use   Vaping Use: Never used  Substance Use Topics   Alcohol use: No    Alcohol/week: 0.0 standard drinks of alcohol   Drug use: No     Medication list has been reviewed and updated.  Current Meds  Medication Sig   hydrALAZINE (APRESOLINE) 25 MG tablet NEW PRESCRIPTION REQUEST:: TAKE ONE TABLET BY MOUTH THREE TIMES DAILY   meclizine (ANTIVERT)  12.5 MG tablet TAKE ONE TABLET BY MOUTH THREE TIMES DAILY AS NEEDED FOR DIZZINESS   metoCLOPramide (REGLAN) 5 MG tablet NEW PRESCRIPTION REQUEST:: TAKE ONE TABLET BY MOUTH THREE TIMES DAILY   OLANZapine (ZYPREXA) 2.5 MG tablet Take 1 tablet (2.5 mg total) by mouth at bedtime.   ondansetron (ZOFRAN) 4 MG tablet NEW PRESCRIPTION REQUEST:: TAKE ONE TABLET BY MOUTH EVERY 8 HOURS AS NEEDED   pantoprazole (PROTONIX) 40 MG tablet Take 1 tablet (40 mg total) by mouth 2 (two) times daily.       03/04/2022    8:08 AM 01/21/2022   11:09 AM 12/29/2021    1:14 PM 06/15/2021    2:05 PM  GAD 7 : Generalized Anxiety Score  Nervous, Anxious, on Edge 0 3 0 0  Control/stop worrying _0 0  Worry too much - different things _1 0  Trouble relaxing _2 0  Restless _3 0  Easily annoyed or irritable _4 0  Afraid - awful might happen 0 1 0 0  Total GAD 7 Score _5 0  Anxiety Difficulty Somewhat difficult Somewhat difficult Somewhat difficult        03/04/2022    8:08 AM 01/21/2022   11:09 AM 12/29/2021    1:14 PM  Depression screen PHQ 2/9  Decreased Interest _6 Down, Depressed, Hopeless _7 PHQ - 2 Score _8 Altered sleeping 0 1 2  Tired, decreased energy 1 1 0  Change in appetite _9 Feeling bad or failure about yourself  _10 Trouble concentrating 0 1 0  Moving slowly or fidgety/restless 0 1 2  Suicidal thoughts 0 0 0  PHQ-9 Score _11 Difficult doing work/chores Somewhat difficult Somewhat difficult Not difficult at all    BP Readings from Last 3 Encounters:  03/04/22 130/82  02/28/22 130/70  01/21/22 134/78    Physical Exam Cardiovascular:     Rate and Rhythm: Normal rate and regular rhythm.     Pulses: Normal pulses.  Pulmonary:     Effort: Pulmonary effort is normal.     Breath sounds: Normal breath sounds. No wheezing or rhonchi.  Musculoskeletal:     Cervical back: Normal range of motion.  Lymphadenopathy:     Cervical: No cervical adenopathy.   Skin:    General: Skin is warm.          Comments: 1 mm smooth raised flesh colored lesion on left anterior neck  Healing flat pale lesion right posterior scalp   Neurological:     Mental Status: She is alert.     Wt Readings from Last 3 Encounters:  03/04/22 222 lb (100.7 kg)  02/28/22 223 lb 6.4 oz (101.3 kg)  01/21/22 224 lb (  101.6 kg)    BP 130/82   Pulse 60   Ht _0  (1.626 m)   Wt 222 lb (100.7 kg)   BMI 38.11 kg/m   Assessment and Plan: 1. Neoplasm of uncertain behavior of skin Scalp lesion appears to be resolving cyst. Lesion on neck is benign in appearance. Recommend Dermatology evaluation if enlarging.   Partially dictated using Editor, commissioning. Any errors are unintentional.  Halina Maidens, MD [facility]  03/04/2022

## 2022-03-04 NOTE — Telephone Encounter (Signed)
Rodena Piety, can you assist with this? Thank you

## 2022-03-07 ENCOUNTER — Other Ambulatory Visit: Payer: Self-pay | Admitting: *Deleted

## 2022-03-07 DIAGNOSIS — R0609 Other forms of dyspnea: Secondary | ICD-10-CM

## 2022-03-08 ENCOUNTER — Telehealth: Payer: Self-pay | Admitting: Primary Care

## 2022-03-08 NOTE — Telephone Encounter (Signed)
Taylor Ehrich, NP  03/01/2022  8:35 AM EDT     Please let patient know CXR showed cardiomegaly and pulmonary vascular congestion. Not urgent findings but dyspnea could be related to the heart. Please order echocardiogram re: dyspnea     Called and spoke with pt's sister Taylor Johnson and relayed the results of pt's cxr to her again. Taylor Johnson verbalized understanding. Nothing further needed.

## 2022-03-13 NOTE — Progress Notes (Signed)
Reviewed and agree with assessment/plan.   Chesley Mires, MD [facility] 03/13/2022, 7:28 AM Pager:  [phone]

## 2022-03-15 ENCOUNTER — Ambulatory Visit (HOSPITAL_COMMUNITY): Payer: Medicare Other | Attending: Primary Care

## 2022-03-15 DIAGNOSIS — R0609 Other forms of dyspnea: Secondary | ICD-10-CM | POA: Insufficient documentation

## 2022-03-15 LAB — ECHOCARDIOGRAM COMPLETE
Area-P 1/2: 3.21 cm2
S' Lateral: 2.9 cm

## 2022-03-15 NOTE — Telephone Encounter (Signed)
I have sent the order to [employer] to see if they can help

## 2022-03-16 NOTE — Telephone Encounter (Signed)
03/16/2022  8:06 AM Tana Coast - -  Note:   Rec'd confirmation from [employer] that he has this order [place], Madilyn Fireman, Demetra Shiner, Lenna Sciara [place], Blima Dessert, [place], thank you!

## 2022-03-18 ENCOUNTER — Other Ambulatory Visit: Payer: Self-pay | Admitting: Internal Medicine

## 2022-03-18 DIAGNOSIS — F411 Generalized anxiety disorder: Secondary | ICD-10-CM

## 2022-03-22 NOTE — Telephone Encounter (Signed)
Requested medication (s) are due for refill today: Yes  Requested medication (s) are on the active medication list: Yes  Last refill:  Meclizine - 03/01/22    Olanzapine 01/31/22  Future visit scheduled: Yes  Notes to clinic:  See request.    Requested Prescriptions  Pending Prescriptions Disp Refills   meclizine (ANTIVERT) 12.5 MG tablet [Pharmacy Med Name: MECLIZINE HCL 12.5 MG TAB] 30 tablet 0    Sig: TAKE 1 TABLET BY MOUTH 3 TIMES DAILY AS NEEDED FOR DIZZINESS     Not Delegated - Gastroenterology: Antiemetics Failed - 03/18/2022 11:46 AM      Failed - This refill cannot be delegated      Passed - Valid encounter within last 6 months    Recent Outpatient Visits           2 weeks ago Neoplasm of uncertain behavior of skin   [place] [facility] at [facility], Jesse Sans, MD   2 months ago Disorder of both eustachian tubes   [place] [facility] Medicine at [facility], Jesse Sans, MD   2 months ago Abdominal pain, chronic, generalized   [place] [facility] and [facility] Medicine at [facility], Jesse Sans, MD   9 months ago Gastrointestinal hemorrhage, unspecified gastrointestinal hemorrhage type   [place] [facility] and [facility] Medicine at [facility], Jesse Sans, MD   1 year ago Benign essential HTN    Primary Care and Sports Medicine at [facility], Jesse Sans, MD       Future Appointments             In 2 months Army Melia Jesse Sans, MD [facility] and [facility] Medicine at [place]             OLANZapine (ZYPREXA) 2.5 MG tablet [Pharmacy Med Name: OLANZAPINE 2.5 MG TAB] 30 tablet 0    Sig: TAKE 1 TABLET BY MOUTH AT BEDTIME     Not Delegated - Psychiatry:  Antipsychotics - Second Generation (Atypical) - olanzapine Failed - 03/18/2022 11:46 AM      Failed - This refill cannot be delegated      Failed - TSH in normal range and within  360 days    TSH  Date Value Ref Range Status  06/04/2020 1.150 0.450 - 4.500 uIU/mL Final         Failed - Lipid Panel in normal range within the last 12 months    Cholesterol, Total  Date Value Ref Range Status  06/02/2016 157 100 - 199 mg/dL Final   Cholesterol  Date Value Ref Range Status  02/05/2019 163 0 - 200 mg/dL Final   LDL Calculated  Date Value Ref Range Status  06/02/2016 80 0 - 99 mg/dL Final   LDL Cholesterol  Date Value Ref Range Status  02/05/2019 86 0 - 99 mg/dL Final    Comment:           Total Cholesterol/HDL:CHD Risk Coronary Heart Disease Risk Table                     Men   Women  1/2 Average Risk   3.4   3.3  Average Risk       5.0   4.4  2 X Average Risk   9.6   7.1  3 X Average Risk  23.4   11.0  Use the calculated Patient Ratio above and the CHD Risk Table to determine the patient's CHD Risk.        ATP III CLASSIFICATION (LDL):  <100     mg/dL   Optimal  100-129  mg/dL   Near or Above                    Optimal  130-159  mg/dL   Borderline  160-189  mg/dL   High  >190     mg/dL   Very High Performed at [facility], [address] [place], [place] [address]    HDL  Date Value Ref Range Status  02/05/2019 63 >40 mg/dL Final  06/02/2016 61 >39 mg/dL Final   Triglycerides  Date Value Ref Range Status  02/05/2019 72 <150 mg/dL Final         Passed - Completed PHQ-2 or PHQ-9 in the last 360 days      Passed - Last BP in normal range    BP Readings from Last 1 Encounters:  03/04/22 130/82         Passed - Last Heart Rate in normal range    Pulse Readings from Last 1 Encounters:  03/04/22 60         Passed - Valid encounter within last 6 months    Recent Outpatient Visits           2 weeks ago Neoplasm of uncertain behavior of skin   [place] [facility] at [facility], Jesse Sans, MD   2 months ago Disorder of both eustachian tubes   [place] [facility]  Medicine at [facility], Jesse Sans, MD   2 months ago Abdominal pain, chronic, generalized   [place] [facility] Care and [facility] Medicine at [facility], Jesse Sans, MD   9 months ago Gastrointestinal hemorrhage, unspecified gastrointestinal hemorrhage type   [place] Primary Care and Sports Medicine at [facility], Jesse Sans, MD   1 year ago Benign essential HTN   [place] [facility] Care and Sports Medicine at [facility], Jesse Sans, MD       Future Appointments             In 2 months [name], MD [facility] Primary Care and Sports Medicine at [facility], [place] within normal limits and completed in the last 12 months    WBC  Date Value Ref Range Status  02/28/2022 5.9 4.0 - 10.5 K/uL Final   RBC  Date Value Ref Range Status  02/28/2022 4.05 3.87 - 5.11 MIL/uL Final   Hemoglobin  Date Value Ref Range Status  02/28/2022 11.3 (L) 12.0 - 15.0 g/dL Final  12/29/2021 11.7 11.1 - 15.9 g/dL Final   HCT  Date Value Ref Range Status  02/28/2022 38.0 36.0 - 46.0 % Final   Hematocrit  Date Value Ref Range Status  12/29/2021 37.7 34.0 - 46.6 % Final   MCHC  Date Value Ref Range Status  02/28/2022 29.7 (L) 30.0 - 36.0 g/dL Final   [facility]  Date Value Ref Range Status  02/28/2022 27.9 26.0 - 34.0 pg Final   MCV  Date Value Ref Range Status  02/28/2022 93.8 80.0 - 100.0 fL Final  12/29/2021 89 79 - 97 fL Final  02/01/2014 90 80 - 100 fL Final   No  results found for: "PLTCOUNTKUC", "LABPLAT", "POCPLA" RDW  Date Value Ref Range Status  02/28/2022 15.9 (H) 11.5 - 15.5 % Final  12/29/2021 15.8 (H) 11.7 - 15.4 % Final  02/01/2014 15.9 (H) 11.5 - 14.5 % Final         Passed - CMP within normal limits and completed in the last 12 months    Albumin  Date Value Ref Range Status  12/31/2021 3.6 3.5 - 5.0 g/dL Final  06/04/2020 4.4 3.7 - 4.7 g/dL Final  02/01/2014 3.6 3.4 - 5.0 g/dL Final    Alkaline Phosphatase  Date Value Ref Range Status  12/31/2021 64 38 - 126 U/L Final  02/01/2014 106 Unit/L Final    Comment:    45-117 NOTE: New Reference Range 06/07/13    ALT  Date Value Ref Range Status  12/31/2021 19 0 - 44 U/L Final   SGPT (ALT)  Date Value Ref Range Status  02/01/2014 22 12 - 78 U/L Final   AST  Date Value Ref Range Status  12/31/2021 23 15 - 41 U/L Final   SGOT(AST)  Date Value Ref Range Status  02/01/2014 22 15 - 37 Unit/L Final   BUN  Date Value Ref Range Status  02/28/2022 21 8 - 23 mg/dL Final  12/29/2021 15 8 - 27 mg/dL Final  02/01/2014 14 7 - 18 mg/dL Final   Calcium  Date Value Ref Range Status  02/28/2022 9.4 8.9 - 10.3 mg/dL Final   Calcium, Total  Date Value Ref Range Status  02/01/2014 9.2 8.5 - 10.1 mg/dL Final   CO2  Date Value Ref Range Status  02/28/2022 28 22 - 32 mmol/L Final   Co2  Date Value Ref Range Status  02/01/2014 29 21 - 32 mmol/L Final   Bicarbonate  Date Value Ref Range Status  01/14/2019 34.0 (H) 20.0 - 28.0 mmol/L Final   Creatinine  Date Value Ref Range Status  02/01/2014 1.04 0.60 - 1.30 mg/dL Final   Creatinine, Ser  Date Value Ref Range Status  02/28/2022 0.94 0.44 - 1.00 mg/dL Final   Glucose  Date Value Ref Range Status  02/01/2014 103 (H) 65 - 99 mg/dL Final   Glucose, Bld  Date Value Ref Range Status  02/28/2022 91 70 - 99 mg/dL Final    Comment:    Glucose reference range applies only to samples taken after fasting for at least 8 hours.   Glucose-Capillary  Date Value Ref Range Status  01/14/2019 109 (H) 70 - 99 mg/dL Final   Potassium  Date Value Ref Range Status  02/28/2022 3.9 3.5 - 5.1 mmol/L Final  02/01/2014 3.5 3.5 - 5.1 mmol/L Final   Sodium  Date Value Ref Range Status  02/28/2022 138 135 - 145 mmol/L Final  12/29/2021 140 134 - 144 mmol/L Final  02/01/2014 140 136 - 145 mmol/L Final   Total Bilirubin  Date Value Ref Range Status  12/31/2021 0.6 0.3 - 1.2  mg/dL Final   Bilirubin,Total  Date Value Ref Range Status  02/01/2014 0.2 0.2 - 1.0 mg/dL Final   Bilirubin Total  Date Value Ref Range Status  06/04/2020 0.2 0.0 - 1.2 mg/dL Final   Bilirubin, Direct  Date Value Ref Range Status  09/12/2015 <0.1 (L) 0.1 - 0.5 mg/dL Final   Indirect Bilirubin  Date Value Ref Range Status  09/12/2015 NOT CALCULATED 0.3 - 0.9 mg/dL Final   Protein, ur  Date Value Ref Range Status  12/31/2021 NEGATIVE NEGATIVE mg/dL Final  Total Protein  Date Value Ref Range Status  12/31/2021 7.4 6.5 - 8.1 g/dL Final  06/04/2020 7.5 6.0 - 8.5 g/dL Final  02/01/2014 8.6 (H) 6.4 - 8.2 g/dL Final   EGFR (African American)  Date Value Ref Range Status  02/01/2014 >60  Final   GFR calc Af Amer  Date Value Ref Range Status  06/04/2020 71 >59 mL/min/1.73 Final    Comment:    **In accordance with recommendations from the NKF-ASN Task force,**   [facility] is in the process of updating its eGFR calculation to the   2021 CKD-EPI creatinine equation that estimates kidney function   without a race variable.    eGFR  Date Value Ref Range Status  12/29/2021 68 >59 mL/min/1.73 Final   EGFR (Non-African Amer.)  Date Value Ref Range Status  02/01/2014 56 (L)  Final    Comment:    eGFR values <2m/min/1.73 m2 may be an indication of chronic kidney disease (CKD). Calculated eGFR is useful in patients with stable renal function. The eGFR calculation will not be reliable in acutely ill patients when serum creatinine is changing rapidly. It is not useful in  patients on dialysis. The eGFR calculation may not be applicable to patients at the low and high extremes of body sizes, pregnant women, and vegetarians.    GFR, Estimated  Date Value Ref Range Status  02/28/2022 >60 >60 mL/min Final    Comment:    (NOTE) Calculated using the CKD-EPI Creatinine Equation (2021)

## 2022-03-30 DIAGNOSIS — G4733 Obstructive sleep apnea (adult) (pediatric): Secondary | ICD-10-CM | POA: Diagnosis not present

## 2022-04-03 ENCOUNTER — Encounter: Payer: Self-pay | Admitting: Emergency Medicine

## 2022-04-03 ENCOUNTER — Emergency Department: Payer: Medicare Other

## 2022-04-03 ENCOUNTER — Other Ambulatory Visit: Payer: Self-pay

## 2022-04-03 ENCOUNTER — Emergency Department
Admission: EM | Admit: 2022-04-03 | Discharge: 2022-04-03 | Disposition: A | Discharge status: Home / Self Care | Payer: Medicare Other | Attending: Emergency Medicine | Admitting: Emergency Medicine

## 2022-04-03 DIAGNOSIS — R1084 Generalized abdominal pain: Secondary | ICD-10-CM | POA: Insufficient documentation

## 2022-04-03 DIAGNOSIS — Z20822 Contact with and (suspected) exposure to covid-19: Secondary | ICD-10-CM | POA: Diagnosis not present

## 2022-04-03 DIAGNOSIS — R109 Unspecified abdominal pain: Secondary | ICD-10-CM | POA: Diagnosis not present

## 2022-04-03 DIAGNOSIS — I700 Atherosclerosis of aorta: Secondary | ICD-10-CM | POA: Diagnosis not present

## 2022-04-03 DIAGNOSIS — R110 Nausea: Secondary | ICD-10-CM | POA: Insufficient documentation

## 2022-04-03 DIAGNOSIS — R1110 Vomiting, unspecified: Secondary | ICD-10-CM | POA: Diagnosis not present

## 2022-04-03 LAB — URINALYSIS, ROUTINE W REFLEX MICROSCOPIC
Bacteria, UA: NONE SEEN
Bilirubin Urine: NEGATIVE
Glucose, UA: NEGATIVE mg/dL
Ketones, ur: NEGATIVE mg/dL
Leukocytes,Ua: NEGATIVE
Nitrite: NEGATIVE
Protein, ur: NEGATIVE mg/dL
Specific Gravity, Urine: 1.028 (ref 1.005–1.030)
Squamous Epithelial / HPF: NONE SEEN (ref 0–5)
pH: 5 (ref 5.0–8.0)

## 2022-04-03 LAB — CBC WITH DIFFERENTIAL/PLATELET
Abs Immature Granulocytes: 0.02 10*3/uL (ref 0.00–0.07)
Basophils Absolute: 0 10*3/uL (ref 0.0–0.1)
Basophils Relative: 0 %
Eosinophils Absolute: 0 10*3/uL (ref 0.0–0.5)
Eosinophils Relative: 0 %
HCT: 37.2 % (ref 36.0–46.0)
Hemoglobin: 11.3 g/dL — ABNORMAL LOW (ref 12.0–15.0)
Immature Granulocytes: 0 %
Lymphocytes Relative: 8 %
Lymphs Abs: 0.6 10*3/uL — ABNORMAL LOW (ref 0.7–4.0)
MCH: 28.7 pg (ref 26.0–34.0)
MCHC: 30.4 g/dL (ref 30.0–36.0)
MCV: 94.4 fL (ref 80.0–100.0)
Monocytes Absolute: 0.1 10*3/uL (ref 0.1–1.0)
Monocytes Relative: 1 %
Neutro Abs: 6.7 10*3/uL (ref 1.7–7.7)
Neutrophils Relative %: 91 %
Platelets: 172 10*3/uL (ref 150–400)
RBC: 3.94 MIL/uL (ref 3.87–5.11)
RDW: 14.9 % (ref 11.5–15.5)
WBC: 7.4 10*3/uL (ref 4.0–10.5)
nRBC: 0 % (ref 0.0–0.2)

## 2022-04-03 LAB — COMPREHENSIVE METABOLIC PANEL
ALT: 12 U/L (ref 0–44)
AST: 17 U/L (ref 15–41)
Albumin: 3.6 g/dL (ref 3.5–5.0)
Alkaline Phosphatase: 73 U/L (ref 38–126)
Anion gap: 8 (ref 5–15)
BUN: 30 mg/dL — ABNORMAL HIGH (ref 8–23)
CO2: 27 mmol/L (ref 22–32)
Calcium: 9.3 mg/dL (ref 8.9–10.3)
Chloride: 105 mmol/L (ref 98–111)
Creatinine, Ser: 1 mg/dL (ref 0.44–1.00)
GFR, Estimated: 59 mL/min — ABNORMAL LOW (ref >60)
Glucose, Bld: 122 mg/dL — ABNORMAL HIGH (ref 70–99)
Potassium: 3.8 mmol/L (ref 3.5–5.1)
Sodium: 140 mmol/L (ref 135–145)
Total Bilirubin: 0.4 mg/dL (ref 0.3–1.2)
Total Protein: 7.5 g/dL (ref 6.5–8.1)

## 2022-04-03 LAB — RESP PANEL BY RT-PCR (FLU A&B, COVID) ARPGX2
Influenza A by PCR: NEGATIVE
Influenza B by PCR: NEGATIVE
SARS Coronavirus 2 by RT PCR: NEGATIVE

## 2022-04-03 LAB — LIPASE, BLOOD: Lipase: 21 U/L (ref 11–51)

## 2022-04-03 LAB — LACTIC ACID, PLASMA: Lactic Acid, Venous: 1.4 mmol/L (ref 0.5–1.9)

## 2022-04-03 MED ORDER — ONDANSETRON 4 MG PO TBDP: 4.0000 mg | ORAL_TABLET | Freq: Three times a day (TID) | ORAL | 0 refills | Status: DC | PRN

## 2022-04-03 MED ORDER — MORPHINE SULFATE (PF) 2 MG/ML IV SOLN
2.0000 mg | Freq: Once | INTRAVENOUS | Status: AC
  Administered 2022-04-03: 2 mg via INTRAVENOUS
  Filled 2022-04-03: qty 1

## 2022-04-03 MED ORDER — SODIUM CHLORIDE 0.9 % IV BOLUS
1000.0000 mL | Freq: Once | INTRAVENOUS | Status: AC
Start: 2022-04-03 — End: 2022-04-03
  Administered 2022-04-03: 1000 mL via INTRAVENOUS

## 2022-04-03 MED ORDER — IOHEXOL 300 MG/ML  SOLN
100.0000 mL | Freq: Once | INTRAMUSCULAR | Status: AC | PRN
  Administered 2022-04-03: 100 mL via INTRAVENOUS

## 2022-04-03 MED ORDER — ONDANSETRON HCL 4 MG/2ML IJ SOLN
4.0000 mg | Freq: Once | INTRAMUSCULAR | Status: AC
  Administered 2022-04-03: 4 mg via INTRAVENOUS
  Filled 2022-04-03: qty 2

## 2022-04-03 NOTE — ED Triage Notes (Signed)
Pt sister reports pt has diverticulitis and she ste something she was not supposed to yesterday and now is having abd pain and nausea and vomiting. Family reports hx of the same issues and states has been seen here multiple times for the same but has been doing well until now

## 2022-04-03 NOTE — ED Provider Notes (Signed)
[facility] Provider Note    Event Date/Time   First MD Initiated Contact with Patient 04/03/22 0745     (approximate)   History   Chief Complaint: Abdominal Pain   HPI  Taylor Johnson is a 76 y.o. female with a history of anxiety, obstructive sleep apnea, hypertension, morbid obesity who was brought to the ED due to generalized abdominal pain nausea and dry heaves that started at 6:00 AM today after eating hamburger that the patient took herself yesterday evening.  Sister is worried about diverticulitis flareup.  No diarrhea or constipation.  No fever.     Physical Exam   Triage Vital Signs: ED Triage Vitals  Enc Vitals Group     BP 04/03/22 0731 (!) 127/108     Pulse Rate 04/03/22 0734 (!) 101     Resp 04/03/22 0731 20     Temp 04/03/22 0731 97.9 F (36.6 C)     Temp Source 04/03/22 0731 Oral     SpO2 04/03/22 0734 90 %     Weight 04/03/22 0730 222 lb 10.6 oz (101 kg)     Height 04/03/22 0730 '5\' 4"'$  (1.626 m)     Head Circumference --      Peak Flow --      Pain Score 04/03/22 0730 8     Pain Loc --      Pain Edu? --      Excl. in [place]? --     Most recent vital signs: Vitals:   04/03/22 0934 04/03/22 1200  BP: (!) 119/56 (!) 118/48  Pulse: (!) 102 86  Resp: 17 18  Temp:    SpO2: 94% 93%    General: Awake, no distress.  CV:  Good peripheral perfusion.  Regular rate and rhythm.  Normal distal pulses. Resp:  Normal effort.  Clear to auscultation bilaterally Abd:  No distention.  Soft, without focal tenderness.  Mild generalized discomfort to palpation.  No peritoneal signs. Other:  Dry mucous membranes   ED Results / Procedures / Treatments   Labs (all labs ordered are listed, but only abnormal results are displayed) Labs Reviewed  COMPREHENSIVE METABOLIC PANEL - Abnormal; Notable for the following components:      Result Value   Glucose, Bld 122 (*)    BUN 30 (*)    GFR, Estimated 59 (*)    All other components within normal  limits  CBC WITH DIFFERENTIAL/PLATELET - Abnormal; Notable for the following components:   Hemoglobin 11.3 (*)    Lymphs Abs 0.6 (*)    All other components within normal limits  URINALYSIS, ROUTINE W REFLEX MICROSCOPIC - Abnormal; Notable for the following components:   Color, Urine STRAW (*)    APPearance CLEAR (*)    Hgb urine dipstick SMALL (*)    All other components within normal limits  RESP PANEL BY RT-PCR (FLU A&B, COVID) [id number]  LACTIC ACID, PLASMA  LIPASE, BLOOD     EKG    RADIOLOGY CT abdomen pelvis interpreted by me, negative for obstruction or free air or intra-abdominal abscess.  Radiology report reviewed.   PROCEDURES:  Procedures   MEDICATIONS ORDERED IN ED: Medications  sodium chloride 0.9 % bolus 1,000 mL ( Intravenous Infusion Verify 04/03/22 1100)  ondansetron (ZOFRAN) injection 4 mg (4 mg Intravenous Given 04/03/22 0823)  morphine (PF) 2 MG/ML injection 2 mg (2 mg Intravenous Given 04/03/22 0823)  iohexol (OMNIPAQUE) 300 MG/ML solution 100 mL (100 mLs Intravenous Contrast Given 04/03/22  Rosa.Bougie)     IMPRESSION / MDM / ASSESSMENT AND PLAN / ED COURSE  I reviewed the triage vital signs and the nursing notes.                              Differential diagnosis includes, but is not limited to, diverticulitis, viral illness, foodborne illness dehydration, AKI, electrolyte abnormality, anemia  Patient's presentation is most consistent with acute presentation with potential threat to life or bodily function.  Patient presents with abdominal pain and dry heaves for the past few hours.  Due to age and comorbidities, will need to obtain labs and CT scan.  Will give IV fluids, morphine and Zofran for now for hydration and symptom relief.  ----------------------------------------- 1:01 PM on 04/03/2022 ----------------------------------------- Feeling better, tolerating fluids.  Urinalysis unremarkable.  CT scan negative for acute findings.  Work-up reassuring.   Not requiring admission, can be discharged with family for outpatient follow-up.  Most likely foodborne illness/viral gastroenteritis.  Doubt mesenteric ischemia, AAA or dissection.       FINAL CLINICAL IMPRESSION(S) / ED DIAGNOSES   Final diagnoses:  Generalized abdominal pain     Rx / DC Orders   ED Discharge Orders          Ordered    ondansetron (ZOFRAN-ODT) 4 MG disintegrating tablet  Every 8 hours PRN        04/03/22 1300             Note:  This document was prepared using Dragon voice recognition software and may include unintentional dictation errors.   Carrie Mew, MD 04/03/22 1302

## 2022-04-03 NOTE — ED Notes (Signed)
Water given to pt for PO challenge.

## 2022-04-03 NOTE — ED Notes (Signed)
See triage note. Pt to ED for abdominal pain since past 1-2 days. Peri care performed upon arrival.

## 2022-04-03 NOTE — ED Notes (Signed)
The pt was assisted with removing of soiled linen, brief and the then cleaned. The pt was placed on a purwick. The pt pulse ox was registering low and was then placed on the pt's right ear lobe.

## 2022-04-03 NOTE — Discharge Instructions (Signed)
Your lab tests and CT scan were okay today.  Take Zofran as needed for nausea and drink plenty of fluids to stay hydrated.

## 2022-04-18 ENCOUNTER — Other Ambulatory Visit: Payer: Self-pay | Admitting: Internal Medicine

## 2022-04-18 DIAGNOSIS — F411 Generalized anxiety disorder: Secondary | ICD-10-CM

## 2022-04-18 NOTE — Telephone Encounter (Signed)
Requested medications are due for refill today.  yes  Requested medications are on the active medications list.  yes  Last refill. 03/22/2022 #30 0 rf  Future visit scheduled.   yes  Notes to clinic.  Medication refill is not delegated.    Requested Prescriptions  Pending Prescriptions Disp Refills   OLANZapine (ZYPREXA) 2.5 MG tablet 30 tablet 0    Sig: Take 1 tablet (2.5 mg total) by mouth at bedtime.     Not Delegated - Psychiatry:  Antipsychotics - Second Generation (Atypical) - olanzapine Failed - 04/18/2022  1:54 PM      Failed - This refill cannot be delegated      Failed - TSH in normal range and within 360 days    TSH  Date Value Ref Range Status  06/04/2020 1.150 0.450 - 4.500 uIU/mL Final         Failed - Last BP in normal range    BP Readings from Last 1 Encounters:  04/03/22 (!) 105/37         Failed - Lipid Panel in normal range within the last 12 months    Cholesterol, Total  Date Value Ref Range Status  06/02/2016 157 100 - 199 mg/dL Final   Cholesterol  Date Value Ref Range Status  02/05/2019 163 0 - 200 mg/dL Final   LDL Calculated  Date Value Ref Range Status  06/02/2016 80 0 - 99 mg/dL Final   LDL Cholesterol  Date Value Ref Range Status  02/05/2019 86 0 - 99 mg/dL Final    Comment:           Total Cholesterol/HDL:CHD Risk Coronary Heart Disease Risk Table                     Men   Women  1/2 Average Risk   3.4   3.3  Average Risk       5.0   4.4  2 X Average Risk   9.6   7.1  3 X Average Risk  23.4   11.0        Use the calculated Patient Ratio above and the CHD Risk Table to determine the patient's CHD Risk.        ATP III CLASSIFICATION (LDL):  <100     mg/dL   Optimal  100-129  mg/dL   Near or Above                    Optimal  130-159  mg/dL   Borderline  160-189  mg/dL   High  >190     mg/dL   Very High Performed at [facility], [place], [place] [address]    HDL  Date Value Ref Range Status   02/05/2019 63 >40 mg/dL Final  06/02/2016 61 >39 mg/dL Final   Triglycerides  Date Value Ref Range Status  02/05/2019 72 <150 mg/dL Final         Passed - Completed PHQ-2 or PHQ-9 in the last 360 days      Passed - Last Heart Rate in normal range    Pulse Readings from Last 1 Encounters:  04/03/22 82         Passed - Valid encounter within last 6 months    Recent Outpatient Visits           1 month ago Neoplasm of uncertain behavior of skin   [place] [facility] Medicine at [facility]  Robert Bellow, MD   2 months ago Disorder of both eustachian tubes   [place] [facility] Medicine at [facility], Jesse Sans, MD   3 months ago Abdominal pain, chronic, generalized   [place] [facility] Medicine at [facility], Jesse Sans, MD   10 months ago Gastrointestinal hemorrhage, unspecified gastrointestinal hemorrhage type   [place] [facility] Care and Sports Medicine at [facility], Jesse Sans, MD   1 year ago Benign essential HTN   [place] [facility] Care and Sports Medicine at [facility], Jesse Sans, MD       Future Appointments             In 1 month Army Melia Jesse Sans, MD [facility] Primary Care and Sports Medicine at [facility] [place] within normal limits and completed in the last 12 months    WBC  Date Value Ref Range Status  04/03/2022 7.4 4.0 - 10.5 K/uL Final   RBC  Date Value Ref Range Status  04/03/2022 3.94 3.87 - 5.11 MIL/uL Final   Hemoglobin  Date Value Ref Range Status  04/03/2022 11.3 (L) 12.0 - 15.0 g/dL Final  12/29/2021 11.7 11.1 - 15.9 g/dL Final   HCT  Date Value Ref Range Status  04/03/2022 37.2 36.0 - 46.0 % Final   Hematocrit  Date Value Ref Range Status  12/29/2021 37.7 34.0 - 46.6 % Final   MCHC  Date Value Ref Range Status  04/03/2022 30.4 30.0 - 36.0 g/dL Final   [facility]  Date Value Ref Range Status   04/03/2022 28.7 26.0 - 34.0 pg Final   MCV  Date Value Ref Range Status  04/03/2022 94.4 80.0 - 100.0 fL Final  12/29/2021 89 79 - 97 fL Final  02/01/2014 90 80 - 100 fL Final   No results found for: "PLTCOUNTKUC", "LABPLAT", "POCPLA" RDW  Date Value Ref Range Status  04/03/2022 14.9 11.5 - 15.5 % Final  12/29/2021 15.8 (H) 11.7 - 15.4 % Final  02/01/2014 15.9 (H) 11.5 - 14.5 % Final         Passed - CMP within normal limits and completed in the last 12 months    Albumin  Date Value Ref Range Status  04/03/2022 3.6 3.5 - 5.0 g/dL Final  06/04/2020 4.4 3.7 - 4.7 g/dL Final  02/01/2014 3.6 3.4 - 5.0 g/dL Final   Alkaline Phosphatase  Date Value Ref Range Status  04/03/2022 73 38 - 126 U/L Final  02/01/2014 106 Unit/L Final    Comment:    45-117 NOTE: New Reference Range 06/07/13    ALT  Date Value Ref Range Status  04/03/2022 12 0 - 44 U/L Final   SGPT (ALT)  Date Value Ref Range Status  02/01/2014 22 12 - 78 U/L Final   AST  Date Value Ref Range Status  04/03/2022 17 15 - 41 U/L Final   SGOT(AST)  Date Value Ref Range Status  02/01/2014 22 15 - 37 Unit/L Final   BUN  Date Value Ref Range Status  04/03/2022 30 (H) 8 - 23 mg/dL Final  12/29/2021 15 8 - 27 mg/dL Final  02/01/2014 14 7 - 18 mg/dL Final   Calcium  Date Value Ref Range Status  04/03/2022 9.3 8.9 - 10.3 mg/dL Final   Calcium, Total  Date Value Ref Range Status  02/01/2014 9.2 8.5 -  10.1 mg/dL Final   CO2  Date Value Ref Range Status  04/03/2022 27 22 - 32 mmol/L Final   Co2  Date Value Ref Range Status  02/01/2014 29 21 - 32 mmol/L Final   Bicarbonate  Date Value Ref Range Status  01/14/2019 34.0 (H) 20.0 - 28.0 mmol/L Final   Creatinine  Date Value Ref Range Status  02/01/2014 1.04 0.60 - 1.30 mg/dL Final   Creatinine, Ser  Date Value Ref Range Status  04/03/2022 1.00 0.44 - 1.00 mg/dL Final   Glucose  Date Value Ref Range Status  02/01/2014 103 (H) 65 - 99 mg/dL Final    Glucose, Bld  Date Value Ref Range Status  04/03/2022 122 (H) 70 - 99 mg/dL Final    Comment:    Glucose reference range applies only to samples taken after fasting for at least 8 hours.   Glucose-Capillary  Date Value Ref Range Status  01/14/2019 109 (H) 70 - 99 mg/dL Final   Potassium  Date Value Ref Range Status  04/03/2022 3.8 3.5 - 5.1 mmol/L Final  02/01/2014 3.5 3.5 - 5.1 mmol/L Final   Sodium  Date Value Ref Range Status  04/03/2022 140 135 - 145 mmol/L Final  12/29/2021 140 134 - 144 mmol/L Final  02/01/2014 140 136 - 145 mmol/L Final   Total Bilirubin  Date Value Ref Range Status  04/03/2022 0.4 0.3 - 1.2 mg/dL Final   Bilirubin,Total  Date Value Ref Range Status  02/01/2014 0.2 0.2 - 1.0 mg/dL Final   Bilirubin Total  Date Value Ref Range Status  06/04/2020 0.2 0.0 - 1.2 mg/dL Final   Bilirubin, Direct  Date Value Ref Range Status  09/12/2015 <0.1 (L) 0.1 - 0.5 mg/dL Final   Indirect Bilirubin  Date Value Ref Range Status  09/12/2015 NOT CALCULATED 0.3 - 0.9 mg/dL Final   Protein, ur  Date Value Ref Range Status  04/03/2022 NEGATIVE NEGATIVE mg/dL Final   Total Protein  Date Value Ref Range Status  04/03/2022 7.5 6.5 - 8.1 g/dL Final  06/04/2020 7.5 6.0 - 8.5 g/dL Final  02/01/2014 8.6 (H) 6.4 - 8.2 g/dL Final   EGFR (African American)  Date Value Ref Range Status  02/01/2014 >60  Final   GFR calc Af Amer  Date Value Ref Range Status  06/04/2020 71 >59 mL/min/1.73 Final    Comment:    **In accordance with recommendations from the NKF-ASN Task force,**   [facility] is in the process of updating its eGFR calculation to the   2021 CKD-EPI creatinine equation that estimates kidney function   without a race variable.    eGFR  Date Value Ref Range Status  12/29/2021 68 >59 mL/min/1.73 Final   EGFR (Non-African Amer.)  Date Value Ref Range Status  02/01/2014 56 (L)  Final    Comment:    eGFR values <8m/min/1.73 m2 may be an indication of  chronic kidney disease (CKD). Calculated eGFR is useful in patients with stable renal function. The eGFR calculation will not be reliable in acutely ill patients when serum creatinine is changing rapidly. It is not useful in  patients on dialysis. The eGFR calculation may not be applicable to patients at the low and high extremes of body sizes, pregnant women, and vegetarians.    GFR, Estimated  Date Value Ref Range Status  04/03/2022 59 (L) >60 mL/min Final    Comment:    (NOTE) Calculated using the CKD-EPI Creatinine Equation (2021)

## 2022-04-18 NOTE — Telephone Encounter (Signed)
Copied from [place] [id number]. Topic: General - Other >> Apr 18, 2022  9:07 AM Everette C wrote: Reason for CRM: Medication Refill - Medication: OLANZapine (ZYPREXA) 2.5 MG tablet [[id number]] - patient has 3 tablets remaining   Has the patient contacted their pharmacy? No. The patient's sister was uncertain of the medication's status  (Agent: If no, request that the patient contact the pharmacy for the refill. If patient does not wish to contact the pharmacy document the reason why and proceed with request.) (Agent: If yes, when and what did the pharmacy advise?)  Preferred Pharmacy (with phone number or street name): [place], [place] [address] [place] [address] Phone: [phone] Fax: [phone] Hours: Not open 24 hours   Has the patient been seen for an appointment in the last year OR does the patient have an upcoming appointment? Yes.    Agent: Please be advised that RX refills may take up to 3 business days. We ask that you follow-up with your pharmacy.

## 2022-04-19 MED ORDER — OLANZAPINE 2.5 MG PO TABS: 2.5000 mg | ORAL_TABLET | Freq: Every day | ORAL | 1 refills | Status: AC

## 2022-04-21 NOTE — Progress Notes (Signed)
Echocardiogram last month was abnormal. Moderately elevated pulmonary artery systolic pressure. Left atrial size was moderately dilated. The mitral valve is abnormal. Mild to moderate mitral valve regurgitation.  If patient is not established with cardiology please refer

## 2022-04-22 ENCOUNTER — Other Ambulatory Visit: Payer: Self-pay

## 2022-04-22 ENCOUNTER — Other Ambulatory Visit: Payer: Self-pay | Admitting: Internal Medicine

## 2022-04-22 DIAGNOSIS — R931 Abnormal findings on diagnostic imaging of heart and coronary circulation: Secondary | ICD-10-CM

## 2022-04-22 DIAGNOSIS — K2970 Gastritis, unspecified, without bleeding: Secondary | ICD-10-CM

## 2022-04-22 NOTE — Progress Notes (Signed)
mb

## 2022-04-22 NOTE — Telephone Encounter (Signed)
Requested medication (s) are due for refill today: yes  Requested medication (s) are on the active medication list: yes  Last refill:  01/26/22 #90 with 1 RF (3xd)  Future visit scheduled: 05/27/22, seen 03/04/22  Notes to clinic:  This medication can not be delegated, please assess.        Requested Prescriptions  Pending Prescriptions Disp Refills   metoCLOPramide (REGLAN) 5 MG tablet [Pharmacy Med Name: METOCLOPRAMIDE HCL 5 MG TAB] 90 tablet 1    Sig: TAKE ONE TABLET BY MOUTH THREE TIMES A DAY     Not Delegated - Gastroenterology: Antiemetics - metoclopramide Failed - 04/22/2022 12:32 PM      Failed - This refill cannot be delegated      Passed - Cr in normal range and within 360 days    Creatinine  Date Value Ref Range Status  02/01/2014 1.04 0.60 - 1.30 mg/dL Final   Creatinine, Ser  Date Value Ref Range Status  04/03/2022 1.00 0.44 - 1.00 mg/dL Final         Passed - Valid encounter within last 6 months    Recent Outpatient Visits           1 month ago Neoplasm of uncertain behavior of skin   [place] [facility] at [facility], Jesse Sans, MD   3 months ago Disorder of both eustachian tubes   [place] [facility] at [facility], Jesse Sans, MD   3 months ago Abdominal pain, chronic, generalized   [place] [facility] at [facility], Jesse Sans, MD   10 months ago Gastrointestinal hemorrhage, unspecified gastrointestinal hemorrhage type   [facility] at [facility], Jesse Sans, MD   1 year ago Benign essential HTN   [place] [facility] at [facility], Jesse Sans, MD       Future Appointments             In 1 month Army Melia, Jesse Sans, MD [place] [facility] at [facility], [facility]

## 2022-05-04 ENCOUNTER — Other Ambulatory Visit: Payer: Self-pay

## 2022-05-04 ENCOUNTER — Emergency Department: Payer: Medicare Other

## 2022-05-04 ENCOUNTER — Inpatient Hospital Stay
Admission: EM | Admit: 2022-05-04 | Discharge: 2022-05-07 | DRG: 167 | Disposition: A | Discharge status: Home / Self Care | Payer: Medicare Other | Attending: Internal Medicine | Admitting: Internal Medicine

## 2022-05-04 ENCOUNTER — Inpatient Hospital Stay (HOSPITAL_COMMUNITY)
Admit: 2022-05-04 | Discharge: 2022-05-04 | Disposition: A | Discharge status: Home / Self Care | Payer: Medicare Other | Attending: Internal Medicine | Admitting: Internal Medicine

## 2022-05-04 DIAGNOSIS — I3139 Other pericardial effusion (noninflammatory): Secondary | ICD-10-CM | POA: Diagnosis present

## 2022-05-04 DIAGNOSIS — F79 Unspecified intellectual disabilities: Secondary | ICD-10-CM | POA: Diagnosis present

## 2022-05-04 DIAGNOSIS — F32A Depression, unspecified: Secondary | ICD-10-CM | POA: Diagnosis not present

## 2022-05-04 DIAGNOSIS — G4733 Obstructive sleep apnea (adult) (pediatric): Secondary | ICD-10-CM

## 2022-05-04 DIAGNOSIS — J9811 Atelectasis: Secondary | ICD-10-CM | POA: Diagnosis not present

## 2022-05-04 DIAGNOSIS — K5730 Diverticulosis of large intestine without perforation or abscess without bleeding: Secondary | ICD-10-CM | POA: Diagnosis not present

## 2022-05-04 DIAGNOSIS — F418 Other specified anxiety disorders: Secondary | ICD-10-CM | POA: Diagnosis present

## 2022-05-04 DIAGNOSIS — R109 Unspecified abdominal pain: Secondary | ICD-10-CM | POA: Diagnosis not present

## 2022-05-04 DIAGNOSIS — Z6837 Body mass index (BMI) 37.0-37.9, adult: Secondary | ICD-10-CM

## 2022-05-04 DIAGNOSIS — K8020 Calculus of gallbladder without cholecystitis without obstruction: Secondary | ICD-10-CM | POA: Diagnosis not present

## 2022-05-04 DIAGNOSIS — J9859 Other diseases of mediastinum, not elsewhere classified: Secondary | ICD-10-CM | POA: Diagnosis not present

## 2022-05-04 DIAGNOSIS — R1084 Generalized abdominal pain: Principal | ICD-10-CM

## 2022-05-04 DIAGNOSIS — Z8249 Family history of ischemic heart disease and other diseases of the circulatory system: Secondary | ICD-10-CM | POA: Diagnosis not present

## 2022-05-04 DIAGNOSIS — Z841 Family history of disorders of kidney and ureter: Secondary | ICD-10-CM | POA: Diagnosis not present

## 2022-05-04 DIAGNOSIS — K219 Gastro-esophageal reflux disease without esophagitis: Secondary | ICD-10-CM | POA: Diagnosis not present

## 2022-05-04 DIAGNOSIS — G8929 Other chronic pain: Secondary | ICD-10-CM | POA: Diagnosis present

## 2022-05-04 DIAGNOSIS — R079 Chest pain, unspecified: Secondary | ICD-10-CM | POA: Diagnosis not present

## 2022-05-04 DIAGNOSIS — I10 Essential (primary) hypertension: Secondary | ICD-10-CM | POA: Diagnosis present

## 2022-05-04 DIAGNOSIS — D638 Anemia in other chronic diseases classified elsewhere: Secondary | ICD-10-CM | POA: Diagnosis present

## 2022-05-04 DIAGNOSIS — J90 Pleural effusion, not elsewhere classified: Secondary | ICD-10-CM | POA: Diagnosis not present

## 2022-05-04 DIAGNOSIS — D383 Neoplasm of uncertain behavior of mediastinum: Secondary | ICD-10-CM | POA: Diagnosis not present

## 2022-05-04 DIAGNOSIS — Z23 Encounter for immunization: Secondary | ICD-10-CM | POA: Diagnosis present

## 2022-05-04 DIAGNOSIS — E669 Obesity, unspecified: Secondary | ICD-10-CM | POA: Diagnosis present

## 2022-05-04 DIAGNOSIS — F411 Generalized anxiety disorder: Secondary | ICD-10-CM | POA: Diagnosis present

## 2022-05-04 DIAGNOSIS — J690 Pneumonitis due to inhalation of food and vomit: Principal | ICD-10-CM | POA: Diagnosis present

## 2022-05-04 DIAGNOSIS — Z79899 Other long term (current) drug therapy: Secondary | ICD-10-CM

## 2022-05-04 DIAGNOSIS — Z809 Family history of malignant neoplasm, unspecified: Secondary | ICD-10-CM

## 2022-05-04 DIAGNOSIS — N281 Cyst of kidney, acquired: Secondary | ICD-10-CM | POA: Diagnosis not present

## 2022-05-04 DIAGNOSIS — Z9889 Other specified postprocedural states: Secondary | ICD-10-CM

## 2022-05-04 DIAGNOSIS — J432 Centrilobular emphysema: Secondary | ICD-10-CM | POA: Diagnosis not present

## 2022-05-04 DIAGNOSIS — I517 Cardiomegaly: Secondary | ICD-10-CM | POA: Diagnosis not present

## 2022-05-04 LAB — COMPREHENSIVE METABOLIC PANEL
ALT: 16 U/L (ref 0–44)
AST: 19 U/L (ref 15–41)
Albumin: 3.5 g/dL (ref 3.5–5.0)
Alkaline Phosphatase: 73 U/L (ref 38–126)
Anion gap: 6 (ref 5–15)
BUN: 21 mg/dL (ref 8–23)
CO2: 29 mmol/L (ref 22–32)
Calcium: 9.3 mg/dL (ref 8.9–10.3)
Chloride: 106 mmol/L (ref 98–111)
Creatinine, Ser: 0.96 mg/dL (ref 0.44–1.00)
GFR, Estimated: >60 mL/min (ref >60)
Glucose, Bld: 105 mg/dL — ABNORMAL HIGH (ref 70–99)
Potassium: 3.8 mmol/L (ref 3.5–5.1)
Sodium: 141 mmol/L (ref 135–145)
Total Bilirubin: 0.6 mg/dL (ref 0.3–1.2)
Total Protein: 7.8 g/dL (ref 6.5–8.1)

## 2022-05-04 LAB — URINALYSIS, ROUTINE W REFLEX MICROSCOPIC
Bacteria, UA: NONE SEEN
Bilirubin Urine: NEGATIVE
Glucose, UA: NEGATIVE mg/dL
Hgb urine dipstick: NEGATIVE
Ketones, ur: NEGATIVE mg/dL
Nitrite: NEGATIVE
Protein, ur: NEGATIVE mg/dL
Specific Gravity, Urine: 1.01 (ref 1.005–1.030)
pH: 5 (ref 5.0–8.0)

## 2022-05-04 LAB — LACTATE DEHYDROGENASE: LDH: 221 U/L — ABNORMAL HIGH (ref 98–192)

## 2022-05-04 LAB — CBC
HCT: 33.6 % — ABNORMAL LOW (ref 36.0–46.0)
Hemoglobin: 10.1 g/dL — ABNORMAL LOW (ref 12.0–15.0)
MCH: 27.7 pg (ref 26.0–34.0)
MCHC: 30.1 g/dL (ref 30.0–36.0)
MCV: 92.3 fL (ref 80.0–100.0)
Platelets: 302 10*3/uL (ref 150–400)
RBC: 3.64 MIL/uL — ABNORMAL LOW (ref 3.87–5.11)
RDW: 14.8 % (ref 11.5–15.5)
WBC: 6.7 10*3/uL (ref 4.0–10.5)
nRBC: 0 % (ref 0.0–0.2)

## 2022-05-04 LAB — LIPASE, BLOOD: Lipase: 21 U/L (ref 11–51)

## 2022-05-04 LAB — TROPONIN I (HIGH SENSITIVITY)
Troponin I (High Sensitivity): 17 ng/L (ref <18)
Troponin I (High Sensitivity): 15 ng/L (ref <18)

## 2022-05-04 LAB — PROTIME-INR
INR: 1.1 (ref 0.8–1.2)
Prothrombin Time: 14.4 seconds (ref 11.4–15.2)

## 2022-05-04 LAB — BRAIN NATRIURETIC PEPTIDE: B Natriuretic Peptide: 176.8 pg/mL — ABNORMAL HIGH (ref 0.0–100.0)

## 2022-05-04 LAB — APTT: aPTT: 28 seconds (ref 24–36)

## 2022-05-04 MED ORDER — DM-GUAIFENESIN ER 30-600 MG PO TB12: 1.0000 | ORAL_TABLET | Freq: Two times a day (BID) | ORAL | Status: DC | PRN

## 2022-05-04 MED ORDER — METOCLOPRAMIDE HCL 5 MG PO TABS
5.0000 mg | ORAL_TABLET | Freq: Three times a day (TID) | ORAL | Status: DC
  Administered 2022-05-05 – 2022-05-07 (×5): 5 mg via ORAL
  Filled 2022-05-04 (×8): qty 1

## 2022-05-04 MED ORDER — OLANZAPINE 2.5 MG PO TABS
2.5000 mg | ORAL_TABLET | Freq: Every day | ORAL | Status: DC
  Administered 2022-05-04 – 2022-05-06 (×3): 2.5 mg via ORAL
  Filled 2022-05-04 (×3): qty 1

## 2022-05-04 MED ORDER — INFLUENZA VAC A&B SA ADJ QUAD 0.5 ML IM PRSY
0.5000 mL | PREFILLED_SYRINGE | INTRAMUSCULAR | Status: AC
  Administered 2022-05-06: 0.5 mL via INTRAMUSCULAR
  Filled 2022-05-04 (×2): qty 0.5

## 2022-05-04 MED ORDER — ACETAMINOPHEN 500 MG PO TABS
1000.0000 mg | ORAL_TABLET | Freq: Once | ORAL | Status: AC
  Administered 2022-05-04: 1000 mg via ORAL
  Filled 2022-05-04: qty 2

## 2022-05-04 MED ORDER — HEPARIN SODIUM (PORCINE) 5000 UNIT/ML IJ SOLN
5000.0000 [IU] | Freq: Three times a day (TID) | INTRAMUSCULAR | Status: DC
  Administered 2022-05-04 – 2022-05-05 (×4): 5000 [IU] via SUBCUTANEOUS
  Filled 2022-05-04 (×4): qty 1

## 2022-05-04 MED ORDER — IOHEXOL 350 MG/ML SOLN
100.0000 mL | Freq: Once | INTRAVENOUS | Status: AC | PRN
  Administered 2022-05-04: 100 mL via INTRAVENOUS

## 2022-05-04 MED ORDER — MECLIZINE HCL 25 MG PO TABS
12.5000 mg | ORAL_TABLET | Freq: Three times a day (TID) | ORAL | Status: DC | PRN
  Administered 2022-05-05: 12.5 mg via ORAL
  Filled 2022-05-04 (×2): qty 0.5

## 2022-05-04 MED ORDER — METOCLOPRAMIDE HCL 5 MG/ML IJ SOLN
5.0000 mg | Freq: Once | INTRAMUSCULAR | Status: AC
  Administered 2022-05-04: 5 mg via INTRAVENOUS
  Filled 2022-05-04: qty 2

## 2022-05-04 MED ORDER — FUROSEMIDE 10 MG/ML IJ SOLN
40.0000 mg | Freq: Once | INTRAMUSCULAR | Status: AC
  Administered 2022-05-04: 40 mg via INTRAVENOUS
  Filled 2022-05-04: qty 4

## 2022-05-04 MED ORDER — HYDRALAZINE HCL 20 MG/ML IJ SOLN: 5.0000 mg | INTRAMUSCULAR | Status: DC | PRN

## 2022-05-04 MED ORDER — PANTOPRAZOLE SODIUM 40 MG PO TBEC
40.0000 mg | DELAYED_RELEASE_TABLET | Freq: Two times a day (BID) | ORAL | Status: DC
  Administered 2022-05-04 – 2022-05-07 (×5): 40 mg via ORAL
  Filled 2022-05-04 (×5): qty 1

## 2022-05-04 MED ORDER — OXYCODONE-ACETAMINOPHEN 5-325 MG PO TABS: 1.0000 | ORAL_TABLET | ORAL | Status: DC | PRN

## 2022-05-04 MED ORDER — ALBUTEROL SULFATE (2.5 MG/3ML) 0.083% IN NEBU: 3.0000 mL | INHALATION_SOLUTION | RESPIRATORY_TRACT | Status: DC | PRN

## 2022-05-04 MED ORDER — ACETAMINOPHEN 325 MG PO TABS
650.0000 mg | ORAL_TABLET | Freq: Four times a day (QID) | ORAL | Status: DC | PRN
  Administered 2022-05-05 (×2): 650 mg via ORAL
  Filled 2022-05-04 (×2): qty 2

## 2022-05-04 MED ORDER — HYDRALAZINE HCL 25 MG PO TABS
25.0000 mg | ORAL_TABLET | Freq: Three times a day (TID) | ORAL | Status: DC
  Administered 2022-05-04 – 2022-05-07 (×6): 25 mg via ORAL
  Filled 2022-05-04 (×6): qty 1

## 2022-05-04 MED ORDER — ONDANSETRON HCL 4 MG/2ML IJ SOLN: 4.0000 mg | Freq: Three times a day (TID) | INTRAMUSCULAR | Status: DC | PRN

## 2022-05-04 NOTE — Assessment & Plan Note (Addendum)
Mediastinal mass and pleural effusion: Etiology is not clear. -Admitted to telemetry bed as inpatient.  -Consulted Dr. Rogue Bussing.  -Ordered thoracentesis, f/u fluid analysis - Per Dr. Rogue Bussing, Flow cytometry test is included.

## 2022-05-04 NOTE — Assessment & Plan Note (Signed)
-   IV hydralazine as needed -Continue home oral hydralazine

## 2022-05-04 NOTE — Assessment & Plan Note (Signed)
See above

## 2022-05-04 NOTE — Assessment & Plan Note (Addendum)
No signs of tamponade - Get 2D echo for further evaluation

## 2022-05-04 NOTE — Assessment & Plan Note (Signed)
-   Continue home olanzapine

## 2022-05-04 NOTE — ED Triage Notes (Signed)
Pt comes with c/o belly pain. Pt denies any N/V/D.

## 2022-05-04 NOTE — H&P (Signed)
History and Physical    Taylor Johnson UVO:[id number] DOB: 1946-05-30 DOA: 05/04/2022  Referring MD/NP/PA:   PCP: [name], MD   Patient coming from:  The patient is coming from home.  At baseline, pt is independent for most of ADL.        Chief Complaint: chest pain  HPI: Taylor Johnson is a 76 y.o. female with medical history significant of hypertension, depression with anxiety, OSA on CPAP, diverticulitis, developmental delay, obesity obesity BMI 37.76, chronic abdominal pain, who presents with chest pain  Patient states that she has intermittent chest pain for about 4 weeks.  The chest pain is located in the front chest, mild to moderate, aching, nonradiating.  She has mild dry cough, and sometimes has mild shortness of breath.  No fever or chills.  Patient has chronic abdominal pain for a long time.  Patient had HIDD scan on 12/17/2021, which showed known cholelithiasis and no biliary obstruction.  She continues to have intermittent abdominal pain which is mild to moderate, dull, radiating to the back.  She has nausea, occasionally vomiting.  She has loose stool recently, but no active diarrhea today.  Denies symptoms of UTI.  HIDA on 12/17/21: Cystic duct is patent, however delayed gallbladder filling is seen following morphine administration, likely due to known cholelithiasis. No evidence of biliary obstruction.   Data reviewed independently and ED Course: pt was found to have WBC 6.7, BMP 176, troponin level 17, 15, negative urinalysis, GFR> 60.  Temperature normal, blood pressure 144/83, heart rate 85, RR 20, oxygen saturation 94% on room air. CTA showed mediastinal mass and pericardial effusion and pleural effusion.  Patient is admitted to telemetry bed as inpatient.  Dr. Rogue Bussing of oncology is consulted.  US-RUQ: Cholelithiasis with largest stone measuring up to 13 mm. No evidence of acute cholecystitis. Pericardial effusion and right pleural effusion noted  EKG: I  have personally reviewed.  Sinus rhythm, QTc 413, LAE, T wave inversion in V4-V6   Review of Systems:   General: no fevers, chills, no body weight gain, has poor appetite, has fatigue HEENT: no blurry vision, hearing changes or sore throat Respiratory: no dyspnea, coughing, wheezing CV: no chest pain, no palpitations GI: no nausea, vomiting, abdominal pain, diarrhea, constipation GU: no dysuria, burning on urination, increased urinary frequency, hematuria  Ext: no leg edema Neuro: no unilateral weakness, numbness, or tingling, no vision change or hearing loss Skin: no rash, no skin tear. MSK: No muscle spasm, no deformity, no limitation of range of movement in spin Heme: No easy bruising.  Travel history: No recent long distant travel.   Allergy: No Known Allergies  Past Medical History:  Diagnosis Date   Abdominal pain 01/27/2017   Anxiety    Anxiety, generalized 05/11/2015   Cough 06/02/2016   Chronic - followed by Pulmonary   Cough 06/02/2016   Chronic - followed by Pulmonary   Depression    Developmental delay    Diverticulitis    Diverticulosis of colon 05/11/2015   Dysphagia 10/15/2015   Routine Ba Swallow normal; rec modified barium swallow    GERD (gastroesophageal reflux disease)    Hematochezia 11/07/2019   Hypertension    Hypoxia 09/12/2015   Leg swelling    Localized edema 04/15/2016   Microscopic hematuria 04/15/2016   Nausea and vomiting 01/29/2017   Orthostatic hypotension 03/16/2017   OSA on CPAP 11/18/2015   CPAP @ 18 cm H2O started 02/2016   Rectal bleeding 10/15/2018   Urge  incontinence of urine 05/12/2015    Past Surgical History:  Procedure Laterality Date   ABDOMINAL HYSTERECTOMY     BREAST BIOPSY Left    neg   COLON SURGERY     COLONOSCOPY WITH PROPOFOL N/A 12/13/2019   Procedure: COLONOSCOPY WITH PROPOFOL;  Surgeon: Lin Landsman, MD;  Location: [facility] ENDOSCOPY;  Service: Endoscopy;  Laterality: N/A;   ESOPHAGOGASTRODUODENOSCOPY  (EGD) WITH PROPOFOL N/A 01/29/2017   Procedure: ESOPHAGOGASTRODUODENOSCOPY (EGD) WITH PROPOFOL;  Surgeon: Wilford Corner, MD;  Location: [facility] ENDOSCOPY;  Service: Endoscopy;  Laterality: N/A;   ESOPHAGOGASTRODUODENOSCOPY (EGD) WITH PROPOFOL N/A 08/02/2021   Procedure: ESOPHAGOGASTRODUODENOSCOPY (EGD) WITH PROPOFOL;  Surgeon: Lin Landsman, MD;  Location: [facility] ENDOSCOPY;  Service: Gastroenterology;  Laterality: N/A;    Social History:  reports that she has never smoked. She has never used smokeless tobacco. She reports that she does not drink alcohol and does not use drugs.  Family History:  Family History  Problem Relation Age of Onset   Hypertension Mother    Kidney disease Mother    Dementia Mother    Cancer Father        lung   Bladder Cancer Neg Hx    Breast cancer Neg Hx      Prior to Admission medications   Medication Sig Start Date End Date Taking? Authorizing Provider  hydrALAZINE (APRESOLINE) 25 MG tablet NEW PRESCRIPTION REQUEST:: TAKE ONE TABLET BY MOUTH THREE TIMES DAILY 01/26/22   [name], MD  meclizine (ANTIVERT) 12.5 MG tablet TAKE 1 TABLET BY MOUTH 3 TIMES DAILY AS NEEDED FOR DIZZINESS 03/22/22   [name], MD  metoCLOPramide (REGLAN) 5 MG tablet TAKE ONE TABLET BY MOUTH THREE TIMES A DAY 04/22/22   [name], MD  OLANZapine (ZYPREXA) 2.5 MG tablet Take 1 tablet (2.5 mg total) by mouth at bedtime. 04/19/22   [name], MD  ondansetron (ZOFRAN) 4 MG tablet NEW PRESCRIPTION REQUEST:: TAKE ONE TABLET BY MOUTH EVERY 8 HOURS AS NEEDED 01/06/22   [name], MD  ondansetron (ZOFRAN-ODT) 4 MG disintegrating tablet Take 1 tablet (4 mg total) by mouth every 8 (eight) hours as needed for nausea or vomiting. 04/03/22   Carrie Mew, MD  pantoprazole (PROTONIX) 40 MG tablet Take 1 tablet (40 mg total) by mouth 2 (two) times daily. 12/23/21 03/04/22  Nolberto Hanlon, MD    Physical Exam: Vitals:   05/04/22 1355 05/04/22 1357 05/04/22 1358  05/04/22 1530  BP: (!) 144/83     Pulse: 66 66 66 71  Resp: 16     Temp:      TempSrc:      SpO2: 94% 91% 94% 94%  Weight:      Height:       General: Not in acute distress HEENT:       Eyes: PERRL, EOMI, no scleral icterus.       ENT: No discharge from the ears and nose, no pharynx injection, no tonsillar enlargement.        Neck: No JVD, no bruit, no mass felt. Heme: No neck lymph node enlargement. Cardiac: S1/S2, RRR, No murmurs, No gallops or rubs. Respiratory: No rales, wheezing, rhonchi or rubs. GI: Soft, nondistended, nontender, no rebound pain, no organomegaly, BS present. GU: No hematuria Ext: No pitting leg edema bilaterally. 1+DP/PT pulse bilaterally. Musculoskeletal: No joint deformities, No joint redness or warmth, no limitation of ROM in spin. Skin: No rashes.  Neuro: Alert, oriented X3, cranial nerves II-XII grossly intact, moves  all extremities normally. Muscle strength 5/5 in all extremities, sensation to light touch intact. Brachial reflex 2+ bilaterally. Knee reflex 1+ bilaterally. Negative Babinski's sign. Normal finger to nose test. Psych: Patient is not psychotic, no suicidal or hemocidal ideation.  Labs on Admission: I have personally reviewed following labs and imaging studies  CBC: Recent Labs  Lab 05/04/22 1225  WBC 6.7  HGB 10.1*  HCT 33.6*  MCV 92.3  PLT 762   Basic Metabolic Panel: Recent Labs  Lab 05/04/22 1225  NA 141  K 3.8  CL 106  CO2 29  GLUCOSE 105*  BUN 21  CREATININE 0.96  CALCIUM 9.3   GFR: Estimated Creatinine Clearance: 58.1 mL/min (by C-G formula based on SCr of 0.96 mg/dL). Liver Function Tests: Recent Labs  Lab 05/04/22 1225  AST 19  ALT 16  ALKPHOS 73  BILITOT 0.6  PROT 7.8  ALBUMIN 3.5   Recent Labs  Lab 05/04/22 1225  LIPASE 21   No results for input(s): "AMMONIA" in the last 168 hours. Coagulation Profile: Recent Labs  Lab 05/04/22 1557  INR 1.1   Cardiac Enzymes: No results for input(s):  "CKTOTAL", "CKMB", "CKMBINDEX", "TROPONINI" in the last 168 hours. BNP (last 3 results) No results for input(s): "PROBNP" in the last 8760 hours. HbA1C: No results for input(s): "HGBA1C" in the last 72 hours. CBG: No results for input(s): "GLUCAP" in the last 168 hours. Lipid Profile: No results for input(s): "CHOL", "HDL", "LDLCALC", "TRIG", "CHOLHDL", "LDLDIRECT" in the last 72 hours. Thyroid Function Tests: No results for input(s): "TSH", "T4TOTAL", "FREET4", "T3FREE", "THYROIDAB" in the last 72 hours. Anemia Panel: No results for input(s): "VITAMINB12", "FOLATE", "FERRITIN", "TIBC", "IRON", "RETICCTPCT" in the last 72 hours. Urine analysis:    Component Value Date/Time   COLORURINE COLORLESS (A) 05/04/2022 1226   APPEARANCEUR CLEAR (A) 05/04/2022 1226   APPEARANCEUR Clear 04/05/2013 0848   LABSPEC 1.010 05/04/2022 1226   LABSPEC 1.010 04/05/2013 0848   PHURINE 5.0 05/04/2022 1226   GLUCOSEU NEGATIVE 05/04/2022 1226   GLUCOSEU Negative 04/05/2013 0848   HGBUR NEGATIVE 05/04/2022 1226   BILIRUBINUR NEGATIVE 05/04/2022 1226   BILIRUBINUR neg 04/15/2016 1647   BILIRUBINUR Negative 04/05/2013 0848   KETONESUR NEGATIVE 05/04/2022 1226   PROTEINUR NEGATIVE 05/04/2022 1226   UROBILINOGEN 0.2 04/15/2016 1647   NITRITE NEGATIVE 05/04/2022 1226   LEUKOCYTESUR SMALL (A) 05/04/2022 1226   LEUKOCYTESUR Negative 04/05/2013 0848   Sepsis Labs: '@LABRCNTIP'$ (procalcitonin:4,lacticidven:4) )No results found for this or any previous visit (from the past 240 hour(s)).   Radiological Exams on Admission: US ABDOMEN LIMITED RUQ (LIVER/GB)  Result Date: 05/04/2022 CLINICAL DATA:  [id number] Abdominal pain [id number] EXAM: ULTRASOUND ABDOMEN LIMITED RIGHT UPPER QUADRANT COMPARISON:  CT 05/04/2022 FINDINGS: Gallbladder: Cholelithiasis with largest stone measuring up to 13 mm. No wall thickening or pericholecystic fluid. The gallbladder is nondilated. Negative sonographic Murphy sign. Common bile duct:  Diameter: 4.7 mm, normal.  No intrahepatic ductal dilation. Liver: No focal lesion identified. Within normal limits in parenchymal echogenicity. Portal vein is patent on color Doppler imaging with normal direction of blood flow towards the liver. Other: Pericardial effusion and right pleural effusion. IMPRESSION: Cholelithiasis with largest stone measuring up to 13 mm. No evidence of acute cholecystitis. Pericardial effusion and right pleural effusion noted. Electronically Signed   By: Maurine Simmering M.D.   On: 05/04/2022 16:24   CT Angio Chest/Abd/Pel for Dissection W and/or Wo Contrast  Result Date: 05/04/2022 CLINICAL DATA:  Upper abdominal pain radiating to the back since  yesterday. Clinical concern for acute aortic syndrome. EXAM: CT ANGIOGRAPHY CHEST, ABDOMEN AND PELVIS TECHNIQUE: Non-contrast CT of the chest was initially obtained. Multidetector CT imaging through the chest, abdomen and pelvis was performed using the standard protocol during bolus administration of intravenous contrast. Multiplanar reconstructed images and MIPs were obtained and reviewed to evaluate the vascular anatomy. RADIATION DOSE REDUCTION: This exam was performed according to the departmental dose-optimization program which includes automated exposure control, adjustment of the mA and/or kV according to patient size and/or use of iterative reconstruction technique. CONTRAST:  191m OMNIPAQUE IOHEXOL 350 MG/ML SOLN COMPARISON:  Abdomen and pelvis CT dated 12/31/2021. Chest radiographs dated 02/28/2022. FINDINGS: CTA CHEST FINDINGS Cardiovascular: Enlarged heart. Moderate-sized pericardial effusion measuring up to 0.9 cm in thickness. Mildly enlarged central pulmonary arteries with a main pulmonary artery diameter of 3.4 cm. Mitral valve annulus calcifications. Motion artifacts involving the heart and ascending thoracic aorta. No aneurysm or dissection seen. Mediastinum/Nodes: Normal appearing thyroid gland. Anterior mediastinal mass  on the left containing coarse calcifications. This mass measures 4.3 x 4.1 cm on image number 46/307 and is separate from the thyroid gland and measures 3.9 cm in length on coronal image number 68/11. No enlarged lymph nodes elsewhere in the chest or axillary regions. Unremarkable esophagus. Lungs/Pleura: Small to moderate-sized left pleural effusion with associated compressive left lower lobe atelectasis. Mild dependent left upper lobe atelectasis. Minimal right pleural effusion and minimal right lower lobe atelectasis. Mildly prominent interstitial markings with bilateral centrilobular bullous changes. Musculoskeletal: Thoracic and lower cervical spine degenerative changes. Review of the MIP images confirms the above findings. CTA ABDOMEN AND PELVIS FINDINGS VASCULAR Aorta: Normal caliber aorta without aneurysm, dissection, vasculitis or significant stenosis. Celiac: Patent without evidence of aneurysm, dissection, vasculitis or significant stenosis. SMA: Patent without evidence of aneurysm, dissection, vasculitis or significant stenosis. Renals: 2 renal arteries on both sides. Both renal arteries on each side are patent without evidence of aneurysm, dissection, vasculitis, fibromuscular dysplasia or significant stenosis. IMA: Patent without evidence of aneurysm, dissection, vasculitis or significant stenosis. Inflow: Patent without evidence of aneurysm, dissection, vasculitis or significant stenosis. Veins: No obvious venous abnormality within the limitations of this arterial phase study. Review of the MIP images confirms the above findings. NON-VASCULAR Hepatobiliary: 8 mm gallstone in the gallbladder neck without gallbladder wall thickening or pericholecystic fluid. Unremarkable liver. Pancreas: Unremarkable. No pancreatic ductal dilatation or surrounding inflammatory changes. Spleen: Normal in size without focal abnormality. Adrenals/Urinary Tract: Mild bilateral adrenal hyperplasia. Small cysts in both  kidneys without significant change. These are not need follow-up. Unremarkable urinary bladder and ureters. Stomach/Bowel: Large number of colonic diverticula without evidence of diverticulitis. Unremarkable stomach, small bowel and appendix. Lymphatic: Enlarged lymph nodes. Reproductive: Calcified uterine fibroids. The largest measures 4.9 cm. No adnexal masses. Other: No abdominal wall hernia or abnormality. No abdominopelvic ascites. Musculoskeletal: Lumbar spine degenerative changes. Review of the MIP images confirms the above findings. IMPRESSION: 1. No aortic aneurysm or dissection. 2. 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 This is separate from the thyroid gland. Differential considerations include germ cell neoplasm such as teratoma. This could be further evaluated with PET-CT or biopsy. 3. Cardiomegaly with a moderate-sized pericardial effusion. 4. Small to moderate-sized left pleural effusion with associated compressive left lower lobe atelectasis. 5. Minimal right pleural effusion and minimal right lower lobe atelectasis. 6. Centrilobular emphysema. 7. Cholelithiasis. 8. Colonic diverticulosis. Electronically Signed   By: SClaudie ReveringM.D.   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 -Admitted to telemetry bed as inpatient.  -Consulted Dr. Rogue Bussing.  -Ordered thoracentesis, f/u fluid analysis - Per Dr. Rogue Bussing, Flow cytometry test is included.  Pleural effusion - See above  Pericardial effusion No signs of tamponade - Get 2D echo for further evaluation  Benign essential HTN - IV  hydralazine as needed -Continue home oral hydralazine  Abdominal pain, chronic, generalized This is chronic issue.  May be due to gallstone, but no signs of cholecystitis.  Patient is being treated with Reglan for possible gastroparesis -As needed Percocet -Continue Reglan  Depression with anxiety - Continue home olanzapine  Obesity with body mass index (BMI) of 30.0 to 39.9  BMI= 37.76   and BW= 99.8 kg -Diet and exercise.   -Encourage to lose weight.            DVT ppx: SQ Heparin     Code Status: Full code  Family Communication:   Yes, patient's  sister  at bed side.     Disposition Plan:  Anticipate discharge back to previous environment  Consults called:  Dr. Rogue Bussing of oncology is consulted  Admission status and Level of care: Telemetry Medical:    as inpt        Dispo: The patient is from: Home              Anticipated d/c is to: Home              Anticipated d/c date is: 2 days              Patient currently is not medically stable to d/c.    Severity of Illness:  The appropriate patient status for this patient is INPATIENT. Inpatient status is judged to be reasonable and necessary in order to provide the required intensity of service to ensure the patient's safety. The patient's presenting symptoms, physical exam findings, and initial radiographic and laboratory data in the context of their chronic comorbidities is felt to place them at high risk for further clinical deterioration. Furthermore, it is not anticipated that the patient will be medically stable for discharge from the hospital within 2 midnights of admission.   * I certify that at the point of admission it is my clinical judgment that the patient will require inpatient hospital care spanning beyond 2 midnights from the point of admission due to high intensity of service, high risk for further deterioration and high frequency of surveillance required.*       Date of Service 05/04/2022     Ivor Costa [facility]   If 7PM-7AM, please contact night-coverage [email or web address] 05/04/2022, 5:58 PM

## 2022-05-04 NOTE — ED Notes (Signed)
Pt to ED for upper abdominal pain radiating to back since yesterday. 20# IV placed to [facility] for CTA. Pending EKG and urine.

## 2022-05-04 NOTE — Assessment & Plan Note (Signed)
  BMI= 37.76   and BW= 99.8 kg -Diet and exercise.   -Encourage to lose weight.

## 2022-05-04 NOTE — ED Provider Notes (Addendum)
[facility] Provider Note    Event Date/Time   First MD Initiated Contact with Patient 05/04/22 1316     (approximate)   History   Abdominal Pain   HPI  Taylor Johnson is a 76 y.o. female who comes in with mid abdominal pain as well as chest pain.  She reports this started yesterday although she is not the best historian she initially stated that it started after eating some food and salad but then later reported that the pain was there prior to eating.  She reports the pain is significant, nonradiating.  She reports breathing okay.  To me she reported that it was lower abdominal pain and then admitted chest pain but to the nurse she reported that it was upper abdominal pain so is difficult to know exactly where the pain is located at.  I reviewed the notes for patient was seen on 12/16/2021 for concern for chronic abdominal pain and at that time she had a normal HIDA scan and they did not feel like it was related to biliary colic.  She was placed on medications for concern for gastroparesis   Physical Exam   Triage Vital Signs: ED Triage Vitals  Enc Vitals Group     BP 05/04/22 1223 (!) 165/75     Pulse Rate 05/04/22 1223 85     Resp 05/04/22 1223 20     Temp 05/04/22 1223 98.3 F (36.8 C)     Temp Source 05/04/22 1223 Oral     SpO2 05/04/22 1223 98 %     Weight 05/04/22 1224 220 lb (99.8 kg)     Height 05/04/22 1224 '5\' 4"'$  (1.626 m)     Head Circumference --      Peak Flow --      Pain Score 05/04/22 1221 7     Pain Loc --      Pain Edu? --      Excl. in [place]? --     Most recent vital signs: Vitals:   05/04/22 1223  BP: (!) 165/75  Pulse: 85  Resp: 20  Temp: 98.3 F (36.8 C)  SpO2: 98%     General: Awake, no distress.  CV:  Good peripheral perfusion.  Resp:  Normal effort.  Abd:  No distention.  Other:  Slight tenderness in the lower abdomen without any rebound or guarding.   ED Results / Procedures / Treatments   Labs (all labs  ordered are listed, but only abnormal results are displayed) Labs Reviewed  COMPREHENSIVE METABOLIC PANEL - Abnormal; Notable for the following components:      Result Value   Glucose, Bld 105 (*)    All other components within normal limits  CBC - Abnormal; Notable for the following components:   RBC 3.64 (*)    Hemoglobin 10.1 (*)    HCT 33.6 (*)    All other components within normal limits  LIPASE, BLOOD  URINALYSIS, ROUTINE W REFLEX MICROSCOPIC  TROPONIN I (HIGH SENSITIVITY)     EKG  My interpretation of EKG:  Normal sinus, some T wave inversions in V4 through V6, normal intervals Reviewed her prior EKGs and she had similar T wave inversions  RADIOLOGY I have reviewed the CT personally interpreted no signs of dissection  PROCEDURES:  Critical Care performed: No  Procedures   MEDICATIONS ORDERED IN ED: Medications  acetaminophen (TYLENOL) tablet 1,000 mg (1,000 mg Oral Given 05/04/22 1350)     IMPRESSION / MDM / ASSESSMENT  AND PLAN / ED COURSE  I reviewed the triage vital signs and the nursing notes.   Patient's presentation is most consistent with acute presentation with potential threat to life or bodily function.   Patient comes in with chest pain, abdominal pain.  EKG, troponin ordered evaluate for ACS.  She states that the pain seems separate but she is not the best historian and is hypertensive so we will get CT dissection to rule out dissection, ischemic colitis, obstruction, other acute pathology.  She does have a history of gallstones will get ultrasound to make sure no signs of cholecystitis.  She has been admitted previously expected some of this is just her chronic abdominal pain.  We will do some Tylenol, Reglan in case this could be related to gastroparesis.  CBC shows slightly lower hemoglobin.  White count is normal.  Lipase is normal CMP is normal   IMPRESSION: 1. No aortic aneurysm or dissection. 2. 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 This is separate from the thyroid gland. Differential considerations include germ cell neoplasm such as teratoma. This could be further evaluated with PET-CT or biopsy. 3. Cardiomegaly with a moderate-sized pericardial effusion. 4. Small to moderate-sized left pleural effusion with associated compressive left lower lobe atelectasis. 5. Minimal right pleural effusion and minimal right lower lobe atelectasis. 6. Centrilobular emphysema. 7. Cholelithiasis. 8. Colonic diverticulosis.  Given moderate size pericardial effusion with chest pain and new pleural effusions we will give a dose of IV Lasix.  Add on BNP.  Discussed with pt the concern for the mediastinal mass and the concern for possible neoplasm.  They understand they will need further work-up with this with biopsy or CT  we will admit to the hospital team for further work-up of all of this.   FINAL CLINICAL IMPRESSION(S) / ED DIAGNOSES   Final diagnoses:  Generalized abdominal pain  Mediastinal mass  Pleural effusion  Pericardial effusion     Rx / DC Orders   ED Discharge Orders     None        Note:  This document was prepared using Dragon voice recognition software and may include unintentional dictation errors.   Vanessa Frederica, MD 05/04/22 1448    Vanessa [place], MD 05/04/22 [phone]

## 2022-05-04 NOTE — ED Notes (Signed)
Pt to CT

## 2022-05-04 NOTE — ED Notes (Signed)
Pt will go to room 35. Called cpod nurse.

## 2022-05-04 NOTE — ED Notes (Signed)
Urine sent to lab 

## 2022-05-04 NOTE — Assessment & Plan Note (Addendum)
This is chronic issue.  May be due to gallstone, but no signs of cholecystitis.  Patient is being treated with Reglan for possible gastroparesis -As needed Percocet -Continue Reglan

## 2022-05-05 ENCOUNTER — Inpatient Hospital Stay: Payer: Medicare Other

## 2022-05-05 DIAGNOSIS — J90 Pleural effusion, not elsewhere classified: Secondary | ICD-10-CM | POA: Diagnosis not present

## 2022-05-05 DIAGNOSIS — J9859 Other diseases of mediastinum, not elsewhere classified: Secondary | ICD-10-CM | POA: Diagnosis not present

## 2022-05-05 DIAGNOSIS — I3139 Other pericardial effusion (noninflammatory): Secondary | ICD-10-CM | POA: Diagnosis not present

## 2022-05-05 LAB — BASIC METABOLIC PANEL
Anion gap: 8 (ref 5–15)
BUN: 21 mg/dL (ref 8–23)
CO2: 29 mmol/L (ref 22–32)
Calcium: 9.2 mg/dL (ref 8.9–10.3)
Chloride: 104 mmol/L (ref 98–111)
Creatinine, Ser: 1.01 mg/dL — ABNORMAL HIGH (ref 0.44–1.00)
GFR, Estimated: 58 mL/min — ABNORMAL LOW (ref >60)
Glucose, Bld: 95 mg/dL (ref 70–99)
Potassium: 3.8 mmol/L (ref 3.5–5.1)
Sodium: 141 mmol/L (ref 135–145)

## 2022-05-05 LAB — CBC
HCT: 32.3 % — ABNORMAL LOW (ref 36.0–46.0)
Hemoglobin: 9.8 g/dL — ABNORMAL LOW (ref 12.0–15.0)
MCH: 27.7 pg (ref 26.0–34.0)
MCHC: 30.3 g/dL (ref 30.0–36.0)
MCV: 91.2 fL (ref 80.0–100.0)
Platelets: 312 10*3/uL (ref 150–400)
RBC: 3.54 MIL/uL — ABNORMAL LOW (ref 3.87–5.11)
RDW: 14.9 % (ref 11.5–15.5)
WBC: 5.8 10*3/uL (ref 4.0–10.5)
nRBC: 0 % (ref 0.0–0.2)

## 2022-05-05 LAB — FERRITIN: Ferritin: 169 ng/mL (ref 11–307)

## 2022-05-05 LAB — IRON AND TIBC
Iron: 29 ug/dL (ref 28–170)
Saturation Ratios: 13 % (ref 10.4–31.8)
TIBC: 221 ug/dL — ABNORMAL LOW (ref 250–450)
UIBC: 192 ug/dL

## 2022-05-05 LAB — ECHOCARDIOGRAM COMPLETE
AV Mean grad: 10.5 mmHg
AV Peak grad: 20.3 mmHg
Ao pk vel: 2.25 m/s
Area-P 1/2: 3.27 cm2
Height: 64 in
S' Lateral: 3.4 cm
Weight: 3520 oz

## 2022-05-05 LAB — LACTATE DEHYDROGENASE: LDH: 290 U/L — ABNORMAL HIGH (ref 98–192)

## 2022-05-05 MED ORDER — LIDOCAINE HCL 1 % IJ SOLN
10.0000 mL | Freq: Once | INTRAMUSCULAR | Status: AC
  Administered 2022-05-05: 10 mL via INTRADERMAL

## 2022-05-05 MED ORDER — SENNOSIDES-DOCUSATE SODIUM 8.6-50 MG PO TABS
2.0000 | ORAL_TABLET | Freq: Two times a day (BID) | ORAL | Status: DC
  Administered 2022-05-05 – 2022-05-07 (×3): 2 via ORAL
  Filled 2022-05-05 (×3): qty 2

## 2022-05-05 NOTE — Evaluation (Signed)
Clinical/Bedside Swallow Evaluation Patient Details  Name: Taylor Johnson MRN: [id number] Date of Birth: 11-Mar-1946  Today's Date: 05/05/2022 Time: SLP Start Time (ACUTE ONLY): 1645 SLP Stop Time (ACUTE ONLY): 1730 SLP Time Calculation (min) (ACUTE ONLY): 45 min  Past Medical History:  Past Medical History:  Diagnosis Date   Abdominal pain 01/27/2017   Anxiety    Anxiety, generalized 05/11/2015   Cough 06/02/2016   Chronic - followed by Pulmonary   Cough 06/02/2016   Chronic - followed by Pulmonary   Depression    Developmental delay    Diverticulitis    Diverticulosis of colon 05/11/2015   Dysphagia 10/15/2015   Routine Ba Swallow normal; rec modified barium swallow    GERD (gastroesophageal reflux disease)    Hematochezia 11/07/2019   Hypertension    Hypoxia 09/12/2015   Leg swelling    Localized edema 04/15/2016   Microscopic hematuria 04/15/2016   Nausea and vomiting 01/29/2017   Orthostatic hypotension 03/16/2017   OSA on CPAP 11/18/2015   CPAP @ 18 cm H2O started 02/2016   Rectal bleeding 10/15/2018   Urge incontinence of urine 05/12/2015   Past Surgical History:  Past Surgical History:  Procedure Laterality Date   ABDOMINAL HYSTERECTOMY     BREAST BIOPSY Left    neg   COLON SURGERY     COLONOSCOPY WITH PROPOFOL N/A 12/13/2019   Procedure: COLONOSCOPY WITH PROPOFOL;  Surgeon: Lin Landsman, MD;  Location: [place];  Service: Endoscopy;  Laterality: N/A;   ESOPHAGOGASTRODUODENOSCOPY (EGD) WITH PROPOFOL N/A 01/29/2017   Procedure: ESOPHAGOGASTRODUODENOSCOPY (EGD) WITH PROPOFOL;  Surgeon: Wilford Corner, MD;  Location: [facility] ENDOSCOPY;  Service: Endoscopy;  Laterality: N/A;   ESOPHAGOGASTRODUODENOSCOPY (EGD) WITH PROPOFOL N/A 08/02/2021   Procedure: ESOPHAGOGASTRODUODENOSCOPY (EGD) WITH PROPOFOL;  Surgeon: Lin Landsman, MD;  Location: [facility] ENDOSCOPY;  Service: Gastroenterology;  Laterality: N/A;   HPI:  Pt  is a 76 y.o. female with medical  history significant of Developmental Delay, GERD, chronic Reglan use, hypertension, depression with anxiety, OSA on CPAP, diverticulitis, Obesity - BMI 37.76, chronic abdominal pain, who presents with chest pain     Patient states that she has intermittent chest pain for about 4 weeks.  The chest pain is located in the front chest, mild to moderate, aching, nonradiating.  She has mild dry cough, and sometimes has mild shortness of breath.  No fever or chills.  Patient has chronic abdominal pain for a long time.  Patient went on to have a CTA chest abdomen pelvis-that incidentally showed approximately 4 cm anterior mediastinal mass concerning for malignancy; also Cardiomegaly with a moderate-sized pericardial effusion.  4. Small to moderate-sized left pleural effusion with associated  compressive left lower lobe atelectasis.  5. Minimal right pleural effusion and minimal right lower lobe  atelectasis.  6. Centrilobular emphysema.   Oncology has been consulted for further evaluation recommendations.    Assessment / Plan / Recommendation  Clinical Impression  Pt seen for a BSE today. NSG, pt and Family present reported No swallowing problems; pt is swallowing Pills w/ water w/ NSG.  Pt's previous BSE in 12/2021 revealed No obvious oropharyngeal swallowing issues. Pt has a BASELINE h/o GERD and Esophageal dysmotility (noted report of chronic Reglan use per chart notes).  Pt currently awake/alert to answer basic questions re: self. She engaged to feed herself w/ support.  On RA, WBC WNL. Poor Dentition at Baseline.   Pt appears to present w/ adequate oropharyngeal phase swallow function; no neuromuscular weakness or deficits  noted. NO overt clinical s/s of aspiration were noted. Pt appears at reduced risk for aspiration/aspiration pneumonia from an oropharyngeal phase standpoint following general aspiration precautions.  HOWEVER, pt could be at risk for aspiration of REFLUX material (thus pulmonary decline) from  an Esophageal Regurgitation standpoint d/t any GI issues currently/chronically. Recommend continued monitoring and f/u w/ GI for management.   Pt consumed po trials of thin liquids via straw, purees, and soft solid(meats w/ potatoes) at dinner meal w/ Sister present. No overt clinical s/s of aspiration noted; No coughing, decline in respiratory status, no wet vocal quality b/t trials noted. She denied any difficulty swallowing or odynophagia. Oral phase was appropriate for bolus management, mastication of softened foods, A-P transfer, and oral clearing of these trials. Assured pt's Sister, pt, and MD that pt's oropharyngeal swallowing was functional w/ reduced concern for oropharyngeal phase deficits; no aspiration.   The above was reviewed w/ MD and NSG. Recommend continue w/ a Regular diet (w/ meats, foods cut small and moistened for ease of mastication d/t missing Dentition). Family to choose easy to eat foods prepared for her. Recommend thin liquids. Recommend general aspiration precautions w/ support at meals w/ positioning Upright; feeding support as needed; Pills in a Puree if needed for ease of swallowing per NSG.    No further skilled ST needs at this time. ST will sign off. MD/NSG to reconsult if New needs arise during admit.  Education w/ Sister in room on Menu options/food prep; ordering of foods. Sister agreed. SLP Visit Diagnosis: Dysphagia, unspecified (R13.10)    Aspiration Risk   (reduced from an oropharyngeal phase standpoint; GERD at baseline)    Diet Recommendation   continue w/ a Regular diet (w/ meats, foods cut small and moistened for ease of mastication d/t missing Dentition). Family to choose easy to eat foods prepared for her. Recommend thin liquids. Recommend general aspiration precautions w/ support at meals w/ positioning Upright, and feeding support as needed.  Medication Administration: Whole meds with liquid (vs Whole in Puree if needed for ease of swallowing)    Other   Recommendations Recommended Consults:  (GI f/u) Oral Care Recommendations: Oral care BID;Oral care before and after PO;Staff/trained caregiver to provide oral care Other Recommendations:  (n/a)    Recommendations for follow up therapy are one component of a multi-disciplinary discharge planning process, led by the attending physician.  Recommendations may be updated based on patient status, additional functional criteria and insurance authorization.  Follow up Recommendations No SLP follow up      Assistance Recommended at Discharge Intermittent Supervision/Assistance (for feeding at meals.)  Functional Status Assessment Patient has not had a recent decline in their functional status (no decline in swallowing per family)  Frequency and Duration  (n/a)   (n/a)       Prognosis Prognosis for Safe Diet Advancement: Good Barriers to Reach Goals: Cognitive deficits;Severity of deficits;Time post onset Barriers/Prognosis Comment: at baseline      Swallow Study   General Date of Onset: 05/04/22 HPI: Pt  is a 76 y.o. female with medical history significant of Developmental Delay, GERD, chronic Reglan use, hypertension, depression with anxiety, OSA on CPAP, diverticulitis, Obesity - BMI 37.76, chronic abdominal pain, who presents with chest pain     Patient states that she has intermittent chest pain for about 4 weeks.  The chest pain is located in the front chest, mild to moderate, aching, nonradiating.  She has mild dry cough, and sometimes has mild shortness of breath.  No fever or chills.  Patient has chronic abdominal pain for a long time.  Patient went on to have a CTA chest abdomen pelvis-that incidentally showed approximately 4 cm anterior mediastinal mass concerning for malignancy; also Cardiomegaly with a moderate-sized pericardial effusion.  4. Small to moderate-sized left pleural effusion with associated  compressive left lower lobe atelectasis.  5. Minimal right pleural effusion and  minimal right lower lobe  atelectasis.  6. Centrilobular emphysema.   Oncology has been consulted for further evaluation recommendations. Type of Study: Bedside Swallow Evaluation Previous Swallow Assessment: 12/2021 Diet Prior to this Study: Regular;Thin liquids (more of a mech soft diet rec'd) Temperature Spikes Noted: No (wbc 5.8) Respiratory Status: Room air History of Recent Intubation: No Behavior/Cognition: Alert;Cooperative;Pleasant mood;Distractible;Requires cueing (Developmental Delay at baseline) Oral Cavity Assessment: Within Functional Limits Oral Care Completed by SLP: Recent completion by staff Oral Cavity - Dentition: Missing dentition;Poor condition Vision: Functional for self-feeding Self-Feeding Abilities: Able to feed self;Needs assist;Needs set up Patient Positioning: Postural control adequate for testing (head upright and forward) Baseline Vocal Quality: Normal Volitional Cough: Strong Volitional Swallow: Able to elicit    Oral/Motor/Sensory Function Overall Oral Motor/Sensory Function: Within functional limits   Ice Chips Ice chips: Not tested   Thin Liquid Thin Liquid: Within functional limits Presentation: Self Fed;Straw (4 ozs)    Nectar Thick Nectar Thick Liquid: Not tested   Honey Thick Honey Thick Liquid: Not tested   Puree Puree: Within functional limits Presentation: Spoon;Self Fed (5+ trials)   Solid     Solid: Within functional limits (trials of [place] during dinner meal) Other Comments: Sister reported no difficulty w/ the [place]; pt agreed. She stated she didn't want it.         Orinda Kenner, MS, CCC-SLP Speech Language Pathologist Rehab Services; [place] [phone] (ascom) Loden Laurent 05/05/2022,6:06 PM

## 2022-05-05 NOTE — Progress Notes (Signed)
PT placed on CPAP for HS, tolerating well.

## 2022-05-05 NOTE — Consult Note (Signed)
[place] NOTE  Patient Care Team: [name], MD as PCP - General (Internal Medicine) Lin Landsman, MD as Consulting Physician (Gastroenterology)  CHIEF COMPLAINTS/PURPOSE OF CONSULTATION: Anterior mediastinal mass.   HISTORY OF PRESENTING ILLNESS: Accompanied by the sister; patient is a poor historian-intellectual disability.  Taylor Johnson 76 y.o.  female  patient with multiple medical problems-including CHF; obesity-intellectual disability and history of chronic abdominal pain is currently admitted to hospital for intermittent chest pain.  Patient has chronic abdominal pain-which led to further imaging with a CT scan of the abdomen pelvis that did not show any acute process.  Patient went on to have a CTA chest abdomen pelvis-that incidentally showed approximately 4 cm anterior mediastinal mass concerning for malignancy.  Oncology has been consulted for further evaluation recommendations  IMPRESSION: 1. No aortic aneurysm or dissection. 2. 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 This is separate from the thyroid gland. Differential considerations include germ cell neoplasm such as teratoma. This could be further evaluated with PET-CT or biopsy. 3. Cardiomegaly with a moderate-sized pericardial effusion. 4. Small to moderate-sized left pleural effusion with associated compressive left lower lobe atelectasis. 5. Minimal right pleural effusion and minimal right lower lobe atelectasis. 6. Centrilobular emphysema. 7. Cholelithiasis. 8. Colonic diverticulosis.  Patient resting comfortably.  Denies any chest pain at this time.  Review of Systems  Constitutional:  Positive for malaise/fatigue. Negative for chills, diaphoresis, fever and weight loss.  HENT:  Negative for nosebleeds and sore throat.   Eyes:  Negative for double vision.  Respiratory:  Negative for cough, hemoptysis, sputum production, shortness  of breath and wheezing.   Cardiovascular:  Negative for chest pain, palpitations, orthopnea and leg swelling.  Gastrointestinal:  Positive for abdominal pain. Negative for blood in stool, constipation, diarrhea, heartburn, melena, nausea and vomiting.  Genitourinary:  Negative for dysuria, frequency and urgency.  Musculoskeletal:  Positive for back pain and joint pain.  Skin: Negative.  Negative for itching and rash.  Neurological:  Negative for dizziness, tingling, focal weakness, weakness and headaches.  Endo/Heme/Allergies:  Does not bruise/bleed easily.  Psychiatric/Behavioral:  Negative for depression. The patient is not nervous/anxious and does not have insomnia.     MEDICAL HISTORY:  Past Medical History:  Diagnosis Date   Abdominal pain 01/27/2017   Anxiety    Anxiety, generalized 05/11/2015   Cough 06/02/2016   Chronic - followed by Pulmonary   Cough 06/02/2016   Chronic - followed by Pulmonary   Depression    Developmental delay    Diverticulitis    Diverticulosis of colon 05/11/2015   Dysphagia 10/15/2015   Routine Ba Swallow normal; rec modified barium swallow    GERD (gastroesophageal reflux disease)    Hematochezia 11/07/2019   Hypertension    Hypoxia 09/12/2015   Leg swelling    Localized edema 04/15/2016   Microscopic hematuria 04/15/2016   Nausea and vomiting 01/29/2017   Orthostatic hypotension 03/16/2017   OSA on CPAP 11/18/2015   CPAP @ 18 cm H2O started 02/2016   Rectal bleeding 10/15/2018   Urge incontinence of urine 05/12/2015    SURGICAL HISTORY: Past Surgical History:  Procedure Laterality Date   ABDOMINAL HYSTERECTOMY     BREAST BIOPSY Left    neg   COLON SURGERY     COLONOSCOPY WITH PROPOFOL N/A 12/13/2019   Procedure: COLONOSCOPY WITH PROPOFOL;  Surgeon: Lin Landsman, MD;  Location: [facility];  Service: Endoscopy;  Laterality: N/A;  ESOPHAGOGASTRODUODENOSCOPY (EGD) WITH PROPOFOL N/A 01/29/2017   Procedure:  ESOPHAGOGASTRODUODENOSCOPY (EGD) WITH PROPOFOL;  Surgeon: Wilford Corner, MD;  Location: [facility] ENDOSCOPY;  Service: Endoscopy;  Laterality: N/A;   ESOPHAGOGASTRODUODENOSCOPY (EGD) WITH PROPOFOL N/A 08/02/2021   Procedure: ESOPHAGOGASTRODUODENOSCOPY (EGD) WITH PROPOFOL;  Surgeon: Lin Landsman, MD;  Location: [facility] ENDOSCOPY;  Service: Gastroenterology;  Laterality: N/A;    SOCIAL HISTORY: Social History   Socioeconomic History   Marital status: Single    Spouse name: Not on file   Number of children: 0   Years of education: Not on file   Highest education level: 5th grade  Occupational History    Employer: retired  Tobacco Use   Smoking status: Never   Smokeless tobacco: Never   Tobacco comments:    smoking cessation materials not required  Vaping Use   Vaping Use: Never used  Substance and Sexual Activity   Alcohol use: No    Alcohol/week: 0.0 standard drinks of alcohol   Drug use: No   Sexual activity: Never    Birth control/protection: Post-menopausal  Other Topics Concern   Not on file  Social History Narrative   Pt lives with her niece Alvie [place]   Social Determinants of Health   Financial Resource Strain: Low Risk  (03/04/2022)   Overall Financial Resource Strain (CARDIA)    Difficulty of Paying Living Expenses: Not hard at all  Food Insecurity: No Food Insecurity (05/04/2022)   Hunger Vital Sign    Worried About Running Out of Food in the Last Year: Never true    Ran Out of Food in the Last Year: Never true  Transportation Needs: No Transportation Needs (05/04/2022)   PRAPARE - Hydrologist (Medical): No    Lack of Transportation (Non-Medical): No  Physical Activity: Inactive (05/26/2021)   Exercise Vital Sign    Days of Exercise per Week: 0 days    Minutes of Exercise per Session: 0 min  Stress: No Stress Concern Present (05/26/2021)   [place]     Feeling of Stress : Only a little  Social Connections: Socially Isolated (05/26/2021)   Social Connection and Isolation Panel [NHANES]    Frequency of Communication with Friends and Family: More than three times a week    Frequency of Social Gatherings with Friends and Family: More than three times a week    Attends Religious Services: Never    Marine scientist or Organizations: No    Attends Archivist Meetings: Never    Marital Status: Never married  Intimate Partner Violence: Not At Risk (05/04/2022)   Humiliation, Afraid, Rape, and Kick questionnaire    Fear of Current or Ex-Partner: No    Emotionally Abused: No    Physically Abused: No    Sexually Abused: No    FAMILY HISTORY: Family History  Problem Relation Age of Onset   Hypertension Mother    Kidney disease Mother    Dementia Mother    Cancer Father        lung   Bladder Cancer Neg Hx    Breast cancer Neg Hx     ALLERGIES:  has No Known Allergies.  MEDICATIONS:  Current Facility-Administered Medications  Medication Dose Route Frequency Provider Last Rate Last Admin   acetaminophen (TYLENOL) tablet 650 mg  650 mg Oral Q6H PRN Ivor Costa, MD   650 mg at 05/05/22 0820   albuterol (PROVENTIL) (2.5 MG/3ML) 0.083% nebulizer solution  3 mL  3 mL Nebulization Q4H PRN Ivor Costa, MD       dextromethorphan-guaiFENesin [facility] DM) 30-600 MG per 12 hr tablet 1 tablet  1 tablet Oral BID PRN Ivor Costa, MD       heparin injection 5,000 Units  5,000 Units Subcutaneous Q8H Ivor Costa, MD   5,000 Units at 05/05/22 0533   hydrALAZINE (APRESOLINE) injection 5 mg  5 mg Intravenous Q2H PRN Ivor Costa, MD       hydrALAZINE (APRESOLINE) tablet 25 mg  25 mg Oral Q8H Ivor Costa, MD   25 mg at 05/05/22 0533   influenza vaccine adjuvanted (FLUAD) injection 0.5 mL  0.5 mL Intramuscular Tomorrow-1000 Ivor Costa, MD       meclizine (ANTIVERT) tablet 12.5 mg  12.5 mg Oral TID PRN Ivor Costa, MD       metoCLOPramide (REGLAN) tablet  5 mg  5 mg Oral TID Renella Cunas, MD   5 mg at 05/05/22 1300   OLANZapine (ZYPREXA) tablet 2.5 mg  2.5 mg Oral QHS Ivor Costa, MD   2.5 mg at 05/04/22 2134   ondansetron (ZOFRAN) injection 4 mg  4 mg Intravenous Q8H PRN Ivor Costa, MD       oxyCODONE-acetaminophen (PERCOCET/ROXICET) 5-325 MG per tablet 1 tablet  1 tablet Oral Q4H PRN Ivor Costa, MD       pantoprazole (PROTONIX) EC tablet 40 mg  40 mg Oral BID Ivor Costa, MD   40 mg at 05/05/22 8938   senna-docusate (Senokot-S) tablet 2 tablet  2 tablet Oral BID Sharen Hones, MD        PHYSICAL EXAMINATION:   Vitals:   05/05/22 1034 05/05/22 1535  BP: (!) 148/53 (!) 139/54  Pulse: 88 84  Resp: 20 (!) 24  Temp:  99.1 F (37.3 C)  SpO2: 93% 93%   Filed Weights   05/04/22 1224  Weight: 220 lb (99.8 kg)      Physical Exam Vitals and nursing note reviewed.  HENT:     Head: Normocephalic and atraumatic.     Mouth/Throat:     Pharynx: Oropharynx is clear.  Eyes:     Extraocular Movements: Extraocular movements intact.     Pupils: Pupils are equal, round, and reactive to light.  Cardiovascular:     Rate and Rhythm: Normal rate and regular rhythm.  Pulmonary:     Comments: Decreased breath sounds bilaterally.  Abdominal:     Palpations: Abdomen is soft.  Musculoskeletal:        General: Normal range of motion.     Cervical back: Normal range of motion.  Skin:    General: Skin is warm.  Neurological:     General: No focal deficit present.     Mental Status: She is alert and oriented to person, place, and time.  Psychiatric:        Behavior: Behavior normal.        Judgment: Judgment normal.     LABORATORY DATA:  I have reviewed the data as listed Lab Results  Component Value Date   WBC 5.8 05/05/2022   HGB 9.8 (L) 05/05/2022   HCT 32.3 (L) 05/05/2022   MCV 91.2 05/05/2022   PLT 312 05/05/2022   Recent Labs    12/31/21 1530 02/28/22 1018 04/03/22 0806 05/04/22 1225 05/05/22 0548  NA 137   < > 140 141 141   K 4.7   < > 3.8 3.8 3.8  CL 101   < > 105 106  104  CO2 28   < > '27 29 29  '$ GLUCOSE 119*   < > 122* 105* 95  BUN 26*   < > 30* 21 21  CREATININE 0.98   < > 1.00 0.96 1.01*  CALCIUM 9.4   < > 9.3 9.3 9.2  GFRNONAA >60   < > 59* >60 58*  PROT 7.4  --  7.5 7.8  --   ALBUMIN 3.6  --  3.6 3.5  --   AST 23  --  17 19  --   ALT 19  --  12 16  --   ALKPHOS 64  --  73 73  --   BILITOT 0.6  --  0.4 0.6  --    < > = values in this interval not displayed.    RADIOGRAPHIC STUDIES: I have personally reviewed the radiological images as listed and agreed with the findings in the report. DG Chest Port 1 View  Result Date: 05/05/2022 CLINICAL DATA:  Status post attempted thoracentesis EXAM: PORTABLE CHEST 1 VIEW COMPARISON:  Previous chest radiographs done on 02/28/2022, CT done on 05/04/2022 FINDINGS: Transverse diameter of heart is increased. Central pulmonary vessels are prominent. There are no signs of alveolar pulmonary edema. There is increased density in left lower lung field. There is blunting of left lateral CP angle. There is no pneumothorax. IMPRESSION: Cardiomegaly. Central pulmonary vessels are prominent without signs of alveolar pulmonary edema. Increased density in left lower lung fields suggests pleural effusion and possibly underlying atelectasis/pneumonia. Electronically Signed   By: Elmer Picker M.D.   On: 05/05/2022 11:30   [name] CHEST (PLEURAL EFFUSION)  Result Date: 05/05/2022 CLINICAL DATA:  Pleural effusion. EXAM: CHEST ULTRASOUND COMPARISON:  One-view chest x-ray 05/05/2022 FINDINGS: A left pleural effusion is identified. Patient was not able to tolerate sitting upright. Upon lean forward, fluid is no longer visible from the posterior approach. Thoracentesis could not be performed. IMPRESSION: 1. Left pleural effusion is not visible with the patient sitting upright and leaning forward. 2. Thoracentesis could not be performed. Electronically Signed   By: San Morelle M.D.    On: 05/05/2022 11:26   US ABDOMEN LIMITED RUQ (LIVER/GB)  Result Date: 05/04/2022 CLINICAL DATA:  [id number] Abdominal pain [id number] EXAM: ULTRASOUND ABDOMEN LIMITED RIGHT UPPER QUADRANT COMPARISON:  CT 05/04/2022 FINDINGS: Gallbladder: Cholelithiasis with largest stone measuring up to 13 mm. No wall thickening or pericholecystic fluid. The gallbladder is nondilated. Negative sonographic Murphy sign. Common bile duct: Diameter: 4.7 mm, normal.  No intrahepatic ductal dilation. Liver: No focal lesion identified. Within normal limits in parenchymal echogenicity. Portal vein is patent on color Doppler imaging with normal direction of blood flow towards the liver. Other: Pericardial effusion and right pleural effusion. IMPRESSION: Cholelithiasis with largest stone measuring up to 13 mm. No evidence of acute cholecystitis. Pericardial effusion and right pleural effusion noted. Electronically Signed   By: Maurine Simmering M.D.   On: 05/04/2022 16:24   CT Angio Chest/Abd/Pel for Dissection W and/or Wo Contrast  Result Date: 05/04/2022 CLINICAL DATA:  Upper abdominal pain radiating to the back since yesterday. Clinical concern for acute aortic syndrome. EXAM: CT ANGIOGRAPHY CHEST, ABDOMEN AND PELVIS TECHNIQUE: Non-contrast CT of the chest was initially obtained. Multidetector CT imaging through the chest, abdomen and pelvis was performed using the standard protocol during bolus administration of intravenous contrast. Multiplanar reconstructed images and MIPs were obtained and reviewed to evaluate the vascular anatomy. RADIATION DOSE REDUCTION: This exam was performed according to the departmental  dose-optimization program which includes automated exposure control, adjustment of the mA and/or kV according to patient size and/or use of iterative reconstruction technique. CONTRAST:  158m OMNIPAQUE IOHEXOL 350 MG/ML SOLN COMPARISON:  Abdomen and pelvis CT dated 12/31/2021. Chest radiographs dated 02/28/2022. FINDINGS: CTA  CHEST FINDINGS Cardiovascular: Enlarged heart. Moderate-sized pericardial effusion measuring up to 0.9 cm in thickness. Mildly enlarged central pulmonary arteries with a main pulmonary artery diameter of 3.4 cm. Mitral valve annulus calcifications. Motion artifacts involving the heart and ascending thoracic aorta. No aneurysm or dissection seen. Mediastinum/Nodes: Normal appearing thyroid gland. Anterior mediastinal mass on the left containing coarse calcifications. This mass measures 4.3 x 4.1 cm on image number 46/307 and is separate from the thyroid gland and measures 3.9 cm in length on coronal image number 68/11. No enlarged lymph nodes elsewhere in the chest or axillary regions. Unremarkable esophagus. Lungs/Pleura: Small to moderate-sized left pleural effusion with associated compressive left lower lobe atelectasis. Mild dependent left upper lobe atelectasis. Minimal right pleural effusion and minimal right lower lobe atelectasis. Mildly prominent interstitial markings with bilateral centrilobular bullous changes. Musculoskeletal: Thoracic and lower cervical spine degenerative changes. Review of the MIP images confirms the above findings. CTA ABDOMEN AND PELVIS FINDINGS VASCULAR Aorta: Normal caliber aorta without aneurysm, dissection, vasculitis or significant stenosis. Celiac: Patent without evidence of aneurysm, dissection, vasculitis or significant stenosis. SMA: Patent without evidence of aneurysm, dissection, vasculitis or significant stenosis. Renals: 2 renal arteries on both sides. Both renal arteries on each side are patent without evidence of aneurysm, dissection, vasculitis, fibromuscular dysplasia or significant stenosis. IMA: Patent without evidence of aneurysm, dissection, vasculitis or significant stenosis. Inflow: Patent without evidence of aneurysm, dissection, vasculitis or significant stenosis. Veins: No obvious venous abnormality within the limitations of this arterial phase study. Review  of the MIP images confirms the above findings. NON-VASCULAR Hepatobiliary: 8 mm gallstone in the gallbladder neck without gallbladder wall thickening or pericholecystic fluid. Unremarkable liver. Pancreas: Unremarkable. No pancreatic ductal dilatation or surrounding inflammatory changes. Spleen: Normal in size without focal abnormality. Adrenals/Urinary Tract: Mild bilateral adrenal hyperplasia. Small cysts in both kidneys without significant change. These are not need follow-up. Unremarkable urinary bladder and ureters. Stomach/Bowel: Large number of colonic diverticula without evidence of diverticulitis. Unremarkable stomach, small bowel and appendix. Lymphatic: Enlarged lymph nodes. Reproductive: Calcified uterine fibroids. The largest measures 4.9 cm. No adnexal masses. Other: No abdominal wall hernia or abnormality. No abdominopelvic ascites. Musculoskeletal: Lumbar spine degenerative changes. Review of the MIP images confirms the above findings. IMPRESSION: 1. No aortic aneurysm or dissection. 2. 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 This is separate from the thyroid gland. Differential considerations include germ cell neoplasm such as teratoma. This could be further evaluated with PET-CT or biopsy. 3. Cardiomegaly with a moderate-sized pericardial effusion. 4. Small to moderate-sized left pleural effusion with associated compressive left lower lobe atelectasis. 5. Minimal right pleural effusion and minimal right lower lobe atelectasis. 6. Centrilobular emphysema. 7. Cholelithiasis. 8. Colonic diverticulosis. Electronically Signed   By: SClaudie ReveringM.D.   On: 05/04/2022 [id number]    #76year old female patient with multiple medical problems-including CHF; obesity-intellectual disability and history of chronic abdominal pain is currently admitted to hospital for similar complaints.  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   #Chronic abdominal pain-unclear etiology no acute findings noted-differential diagnosis includes lymphoma teratoma thymoma.   #CHF/moderate-sized pericardial effusion -mild  to moderate left pleural effusions awaiting thoracentesis.    #Mild anemia hemoglobin 9. ?  Etiology.  #Intellectual disability  Recommendations:   #Recommend CT-guided biopsy of the anterior mediastinal mass-for further evaluation.  Patient will likely need outpatient PET scan for further work-up.  We will check LDH beta-hCG; AFP.  #We will order work-up for anemia.    # Regarding pericardial effusion CHF 2D echo for further evaluation.  # Thank you Dr.Zhang for allowing me to participate in the care of your pleasant patient. Please do not hesitate to contact me with questions or concerns in the interim.  Discussed with Dr. Roosevelt Locks; and also Dr. Denna Haggard. I discussed with the patient's sister, Ms. Primus Bravo regarding the above plan of care.  She is in agreement. My contact information was given to the patient's sister.  Addendum: Thoracentesis not done because of lack of patient's cooperation.   # I reviewed the blood work- with the patient's sister in detail; also reviewed the imaging independently [as summarized above]; and with the patient in detail.    [name], MD 05/05/2022 3:38 PM

## 2022-05-05 NOTE — TOC Initial Note (Addendum)
Transition of Care [facility]) - Initial/Assessment Note    Patient Details  Name: Taylor Johnson MRN: [id number] Date of Birth: March 17, 1946  [facility]) CM/SW Contact:    Laurena Slimmer, RN Phone Number: 05/05/2022, 3:42 PM  Clinical Narrative:                  Admitted EXH:BZJIRCVELFY mass Admitted from: Home with niece PCP: [facility]  Current home health/prior home health/DME: [name]    Expected Discharge Plan: Home/Self Care Barriers to Discharge: Continued Medical Work up   Patient Goals and CMS Choice Patient states their goals for this hospitalization and ongoing recovery are:: To return home      Expected Discharge Plan and Services Expected Discharge Plan: Home/Self Care       Living arrangements for the past 2 months: Single Family Home                                      Prior Living Arrangements/Services Living arrangements for the past 2 months: Single Family Home Lives with:: Self, Other (Comment) (Niece)   Do you feel safe going back to the place where you live?: Yes      Need for Family Participation in Patient Care: Yes (Comment) Care giver support system in place?: Yes (comment)   Criminal Activity/Legal Involvement Pertinent to Current Situation/Hospitalization: No - Comment as needed  Activities of Daily Living Home Assistive Devices/Equipment: Cane (specify quad or straight), CPAP ADL Screening (condition at time of admission) Patient's cognitive ability adequate to safely complete daily activities?: Yes Is the patient deaf or have difficulty hearing?: No Does the patient have difficulty seeing, even when wearing glasses/contacts?: No Does the patient have difficulty concentrating, remembering, or making decisions?: No Patient able to express need for assistance with ADLs?: Yes Does the patient have difficulty dressing or bathing?: No Independently performs ADLs?: Yes (appropriate for  developmental age) Does the patient have difficulty walking or climbing stairs?: Yes Weakness of Legs: None Weakness of Arms/Hands: None  Permission Sought/Granted                  Emotional Assessment Appearance:: Appears stated age Attitude/Demeanor/Rapport: Gracious, Engaged Affect (typically observed): Accepting Orientation: : Oriented to Self, Oriented to Place, Oriented to  Time, Oriented to Situation Alcohol / Substance Use: Not Applicable Psych Involvement: No (comment)  Admission diagnosis:  Pericardial effusion [I31.39] Mediastinal mass [J98.59] Pleural effusion [J90] Generalized abdominal pain [R10.84] Pleural effusion on left [J90] Patient Active Problem List   Diagnosis Date Noted   Mediastinal mass 05/04/2022   Obesity with body mass index (BMI) of 30.0 to 39.9 05/04/2022   Pleural effusion 05/04/2022   Pericardial effusion 05/04/2022   Depression with anxiety 05/04/2022   Dyspnea on exertion 02/28/2022   Abdominal pain, chronic, generalized 12/16/2021   GI bleeding 06/07/2021   CPAP use counseling 08/10/2020   Morbid obesity ([place]) 08/10/2020   GERD (gastroesophageal reflux disease) 11/07/2019   Dizziness 02/04/2019   Benign essential HTN 10/23/2018   Functional systolic murmur 05/03/5101   Developmental delay disorder 02/06/2017   Calculus of gallbladder without cholecystitis without obstruction 01/20/2017   Localized edema 04/15/2016   Microscopic hematuria 04/15/2016   OSA on CPAP 11/18/2015   Urge incontinence of urine 05/12/2015   Diverticulosis of colon 05/11/2015   Anxiety, generalized 05/11/2015   PCP:  [name], MD Pharmacy:  [facility], [place], [place] [address] Phone: [phone] Fax: [phone]  [place], [place] - [address] [place] [address] Phone: [phone] Fax: [place], [place] [address] [place] [address] Phone: [phone] Fax: [phone]  [facility] [place], [place] - [place] [facility] [address] [address] Phone: [phone] Fax: [phone]     Social Determinants of Health (SDOH) Interventions    Readmission Risk Interventions    05/05/2022    3:40 PM  Readmission Risk Prevention Plan  Transportation Screening Complete  HRI or [place] Complete  Social Work Consult for [place] Planning/Counseling Complete  Palliative Care Screening Not Applicable  Medication Review Press photographer) Complete

## 2022-05-05 NOTE — Care Management Important Message (Signed)
Important Message  Patient Details  Name: JEZLYN WESTERFIELD MRN: [id number] Date of Birth: [birth year]   Medicare Important Message Given:  N/A - LOS <3 / Initial given by admissions     Dannette Barbara 05/05/2022, 6:36 PM

## 2022-05-05 NOTE — Hospital Course (Signed)
Taylor Johnson is a 76 y.o. female with medical history significant of hypertension, depression with anxiety, OSA on CPAP, diverticulitis, developmental delay, obesity obesity BMI 37.76, chronic abdominal pain, who presents with chest pain. CTA showed mediastinal mass and pericardial effusion and pleural effusion.  Patient is seen by oncology, paracentesis was attempted without success due to patient agitation and small amount of fluids.  Patient is scheduled to have biopsy on 10/20.

## 2022-05-05 NOTE — Procedures (Signed)
PROCEDURE SUMMARY:  Unsuccessful image-guided left thoracentesis. Procedure was halted due to patient not tolerating procedure well with small pocket of fluid to target. No fluid was collected.  CXR ordered.  Lura Em PA-C 05/05/2022 10:37 AM

## 2022-05-05 NOTE — Plan of Care (Signed)
  Problem: Nutrition: Goal: Adequate nutrition will be maintained Outcome: Progressing   Problem: Elimination: Goal: Will not experience complications related to bowel motility Outcome: Progressing Goal: Will not experience complications related to urinary retention Outcome: Progressing   Problem: Elimination: Goal: Will not experience complications related to urinary retention Outcome: Progressing   Problem: Pain Managment: Goal: General experience of comfort will improve Outcome: Progressing   Problem: Safety: Goal: Ability to remain free from injury will improve Outcome: Progressing

## 2022-05-05 NOTE — Consult Note (Signed)
Chief Complaint: Patient was seen in consultation today for anterior mediastinal mass  Referring Physician(s): Charlaine Dalton, MD  Supervising Physician: Juliet Rude  Patient Status: [facility] - In-pt  History of Present Illness: Taylor Johnson is a 76 y.o. female with PMH of hypertension, depression, anxiety, developmental delay, OSA, diverticulitis, and chronic abdominal pain. She presented to the ED on 10/18 with chest pain x4 weeks. She also reports abdominal pain. 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 IR was consulted to evaluate the mediastinal mass.  Past Medical History:  Diagnosis Date   Abdominal pain 01/27/2017   Anxiety    Anxiety, generalized 05/11/2015   Cough 06/02/2016   Chronic - followed by Pulmonary   Cough 06/02/2016   Chronic - followed by Pulmonary   Depression    Developmental delay    Diverticulitis    Diverticulosis of colon 05/11/2015   Dysphagia 10/15/2015   Routine Ba Swallow normal; rec modified barium swallow    GERD (gastroesophageal reflux disease)    Hematochezia 11/07/2019   Hypertension    Hypoxia 09/12/2015   Leg swelling    Localized edema 04/15/2016   Microscopic hematuria 04/15/2016   Nausea and vomiting 01/29/2017   Orthostatic hypotension 03/16/2017   OSA on CPAP 11/18/2015   CPAP @ 18 cm H2O started 02/2016   Rectal bleeding 10/15/2018   Urge incontinence of urine 05/12/2015    Past Surgical History:  Procedure Laterality Date   ABDOMINAL HYSTERECTOMY     BREAST BIOPSY Left    neg   COLON SURGERY     COLONOSCOPY WITH PROPOFOL N/A 12/13/2019   Procedure: COLONOSCOPY WITH PROPOFOL;  Surgeon: Lin Landsman, MD;  Location: [facility] ENDOSCOPY;  Service: Endoscopy;  Laterality: N/A;   ESOPHAGOGASTRODUODENOSCOPY (EGD) WITH PROPOFOL N/A 01/29/2017   Procedure: ESOPHAGOGASTRODUODENOSCOPY (EGD) WITH PROPOFOL;  Surgeon: Wilford Corner, MD;   Location: [facility]COPY;  Service: Endoscopy;  Laterality: N/A;   ESOPHAGOGASTRODUODENOSCOPY (EGD) WITH PROPOFOL N/A 08/02/2021   Procedure: ESOPHAGOGASTRODUODENOSCOPY (EGD) WITH PROPOFOL;  Surgeon: Lin Landsman, MD;  Location: [facility] ENDOSCOPY;  Service: Gastroenterology;  Laterality: N/A;    Allergies: Patient has no known allergies.  Medications: Prior to Admission medications   Medication Sig Start Date End Date Taking? Authorizing Provider  hydrALAZINE (APRESOLINE) 25 MG tablet NEW PRESCRIPTION REQUEST:: TAKE ONE TABLET BY MOUTH THREE TIMES DAILY 01/26/22  Yes [name], MD  meclizine (ANTIVERT) 12.5 MG tablet TAKE 1 TABLET BY MOUTH 3 TIMES DAILY AS NEEDED FOR DIZZINESS 03/22/22  Yes [name], MD  metoCLOPramide (REGLAN) 5 MG tablet TAKE ONE TABLET BY MOUTH THREE TIMES A DAY 04/22/22  Yes [name], MD  OLANZapine (ZYPREXA) 2.5 MG tablet Take 1 tablet (2.5 mg total) by mouth at bedtime. 04/19/22  Yes [name], MD  ondansetron (ZOFRAN) 4 MG tablet NEW PRESCRIPTION REQUEST:: TAKE ONE TABLET BY MOUTH EVERY 8 HOURS AS NEEDED Patient not taking: Reported on 05/04/2022 01/06/22   [name], MD  ondansetron (ZOFRAN-ODT) 4 MG disintegrating tablet Take 1 tablet (4 mg total) by mouth every 8 (eight) hours as needed for nausea or vomiting. Patient not taking: Reported on 05/04/2022 04/03/22   Carrie Mew, MD  pantoprazole (PROTONIX) 40 MG tablet Take 1 tablet (40 mg total) by mouth 2 (two) times daily. 12/23/21 03/04/22  Nolberto Hanlon, MD     Family History  Problem Relation Age of Onset  Hypertension Mother    Kidney disease Mother    Dementia Mother    Cancer Father        lung   Bladder Cancer Neg Hx    Breast cancer Neg Hx     Social History   Socioeconomic History   Marital status: Single    Spouse name: Not on file   Number of children: 0   Years of education: Not on file   Highest education level: 5th grade  Occupational History     Employer: retired  Tobacco Use   Smoking status: Never   Smokeless tobacco: Never   Tobacco comments:    smoking cessation materials not required  Vaping Use   Vaping Use: Never used  Substance and Sexual Activity   Alcohol use: No    Alcohol/week: 0.0 standard drinks of alcohol   Drug use: No   Sexual activity: Never    Birth control/protection: Post-menopausal  Other Topics Concern   Not on file  Social History Narrative   Pt lives with her niece Taylor Johnson   Social Determinants of Health   Financial Resource Strain: Low Risk  (03/04/2022)   Overall Financial Resource Strain (CARDIA)    Difficulty of Paying Living Expenses: Not hard at all  Food Insecurity: No Food Insecurity (05/04/2022)   Hunger Vital Sign    Worried About Running Out of Food in the Last Year: Never true    [place] in the Last Year: Never true  Transportation Needs: No Transportation Needs (05/04/2022)   PRAPARE - Hydrologist (Medical): No    Lack of Transportation (Non-Medical): No  Physical Activity: Inactive (05/26/2021)   Exercise Vital Sign    Days of Exercise per Week: 0 days    Minutes of Exercise per Session: 0 min  Stress: No Stress Concern Present (05/26/2021)   [place]    Feeling of Stress : Only a little  Social Connections: Socially Isolated (05/26/2021)   Social Connection and Isolation Panel [NHANES]    Frequency of Communication with Friends and Family: More than three times a week    Frequency of Social Gatherings with Friends and Family: More than three times a week    Attends Religious Services: Never    Marine scientist or Organizations: No    Attends Archivist Meetings: Never    Marital Status: Never married    Review of Systems: A 12 point ROS discussed and pertinent positives are indicated in the HPI above.  All other systems are negative.  Review  of Systems  Constitutional:  Negative for chills and fever.  Respiratory:  Negative for chest tightness and shortness of breath.   Cardiovascular:  Positive for chest pain. Negative for leg swelling.  Gastrointestinal:  Positive for abdominal pain. Negative for diarrhea and nausea.  Neurological:  Negative for dizziness, light-headedness and headaches.  Psychiatric/Behavioral:  Positive for confusion.     Vital Signs: BP (!) 139/54 (BP Location: Left Arm)   Pulse 84   Temp 99.1 F (37.3 C) (Oral)   Resp (!) 24   Ht '5\' 4"'$  (1.626 m)   Wt 220 lb (99.8 kg)   SpO2 93%   BMI 37.76 kg/m    Physical Exam Vitals reviewed.  Musculoskeletal:     Right lower leg: Edema present.     Left lower leg: Edema present.     Comments: 1+ pitting edema  Skin:    General: Skin is warm and dry.  Neurological:     Mental Status: She is oriented to person, place, and time. Mental status is at baseline.  Psychiatric:        Mood and Affect: Mood normal.        Behavior: Behavior normal.     Imaging: ECHOCARDIOGRAM COMPLETE  Result Date: 05/05/2022    ECHOCARDIOGRAM REPORT   Patient Name:   [facility] Date of Exam: 05/04/2022 Medical Rec #:  [id number]     Height:       64.0 in Accession #:    [id number]    Weight:       220.0 lb Date of Birth:  September 02, 1945     BSA:          2.037 m Patient Age:    25 years      BP:           142/61 mmHg Patient Gender: F             HR:           64 bpm. Exam Location:  [facility] Procedure: 2D Echo, Cardiac Doppler and Color Doppler Indications:     I31.3 Pericardial, Effusion  History:         Patient has prior history of Echocardiogram examinations, most                  recent 03/15/2022. Signs/Symptoms:Murmur; Risk                  Factors:Hypertension and Sleep Apnea.  Sonographer:     Cresenciano Lick RDCS Referring Phys:  [place] Diagnosing Phys: Ida Rogue MD IMPRESSIONS  1. Left ventricular ejection fraction, by estimation, is 60 to 65%. The left  ventricle has normal function. The left ventricle has no regional wall motion abnormalities. There is mild left ventricular hypertrophy. Left ventricular diastolic parameters are consistent with Grade I diastolic dysfunction (impaired relaxation).  2. Right ventricular systolic function is normal. The right ventricular size is normal. Tricuspid regurgitation signal is inadequate for assessing PA pressure.  3. Left atrial size was moderately dilated.  4. Small to moderate pericardial effusionm measuring 1.47 cm. The pericardial effusion is circumferential. There is no evidence of cardiac tamponade.  5. The mitral valve is normal in structure. No evidence of mitral valve regurgitation. No evidence of mitral stenosis.  6. The aortic valve is normal in structure. Aortic valve regurgitation is not visualized. No aortic stenosis is present.  7. The inferior vena cava is dilated in size with <50% respiratory variability, suggesting right atrial pressure of 15 mmHg. FINDINGS  Left Ventricle: Left ventricular ejection fraction, by estimation, is 60 to 65%. The left ventricle has normal function. The left ventricle has no regional wall motion abnormalities. The left ventricular internal cavity size was normal in size. There is  mild left ventricular hypertrophy. Left ventricular diastolic parameters are consistent with Grade I diastolic dysfunction (impaired relaxation). Right Ventricle: The right ventricular size is normal. No increase in right ventricular wall thickness. Right ventricular systolic function is normal. Tricuspid regurgitation signal is inadequate for assessing PA pressure. Left Atrium: Left atrial size was moderately dilated. Right Atrium: Right atrial size was normal in size. Pericardium: A moderately sized pericardial effusion is present. The pericardial effusion is circumferential. There is no evidence of cardiac tamponade. Mitral Valve: The mitral valve is normal in structure. Mild mitral annular  calcification. No evidence of mitral  valve regurgitation. No evidence of mitral valve stenosis. Tricuspid Valve: The tricuspid valve is normal in structure. Tricuspid valve regurgitation is not demonstrated. No evidence of tricuspid stenosis. Aortic Valve: The aortic valve is normal in structure. Aortic valve regurgitation is not visualized. No aortic stenosis is present. Aortic valve mean gradient measures 10.5 mmHg. Aortic valve peak gradient measures 20.3 mmHg. Pulmonic Valve: The pulmonic valve was normal in structure. Pulmonic valve regurgitation is not visualized. No evidence of pulmonic stenosis. Aorta: The aortic root is normal in size and structure. Venous: The inferior vena cava is dilated in size with less than 50% respiratory variability, suggesting right atrial pressure of 15 mmHg. IAS/Shunts: No atrial level shunt detected by color flow Doppler.  LEFT VENTRICLE PLAX 2D LVIDd:         5.00 cm Diastology LVIDs:         3.40 cm LV e' medial:    7.83 cm/s LV PW:         1.00 cm LV E/e' medial:  13.1 LV IVS:        0.90 cm LV e' lateral:   7.51 cm/s                        LV E/e' lateral: 13.7  RIGHT VENTRICLE             IVC RV Basal diam:  3.30 cm     IVC diam: 2.30 cm RV S prime:     13.45 cm/s TAPSE (M-mode): 2.9 cm LEFT ATRIUM             Index        RIGHT ATRIUM           Index LA diam:        5.20 cm 2.55 cm/m   RA Area:     15.10 cm LA Vol (A2C):   64.1 ml 31.46 ml/m  RA Volume:   37.70 ml  18.51 ml/m LA Vol (A4C):   57.6 ml 28.27 ml/m LA Biplane Vol: 60.5 ml 29.70 ml/m  AORTIC VALVE AV Vmax:           225.25 cm/s AV Vmean:          150.000 cm/s AV VTI:            0.446 m AV Peak Grad:      20.3 mmHg AV Mean Grad:      10.5 mmHg LVOT Vmax:         200.00 cm/s LVOT Vmean:        128.000 cm/s LVOT VTI:          0.406 m LVOT/AV VTI ratio: 0.91  AORTA Ao Root diam: 3.10 cm MITRAL VALVE MV Area (PHT): 3.27 cm     SHUNTS MV Decel Time: 232 msec     Systemic VTI: 0.41 m MV E velocity: 102.55 cm/s  MV A velocity: 119.50 cm/s MV E/A ratio:  0.86 Ida Rogue MD Electronically signed by Ida Rogue MD Signature Date/Time: 05/05/2022/4:28:08 PM    Final    DG Chest Port 1 View  Result Date: 05/05/2022 CLINICAL DATA:  Status post attempted thoracentesis EXAM: PORTABLE CHEST 1 VIEW COMPARISON:  Previous chest radiographs done on 02/28/2022, CT done on 05/04/2022 FINDINGS: Transverse diameter of heart is increased. Central pulmonary vessels are prominent. There are no signs of alveolar pulmonary edema. There is increased density in left lower lung field. There is blunting of left lateral CP  angle. There is no pneumothorax. IMPRESSION: Cardiomegaly. Central pulmonary vessels are prominent without signs of alveolar pulmonary edema. Increased density in left lower lung fields suggests pleural effusion and possibly underlying atelectasis/pneumonia. Electronically Signed   By: Elmer Picker M.D.   On: 05/05/2022 11:30   [name] CHEST (PLEURAL EFFUSION)  Result Date: 05/05/2022 CLINICAL DATA:  Pleural effusion. EXAM: CHEST ULTRASOUND COMPARISON:  One-view chest x-ray 05/05/2022 FINDINGS: A left pleural effusion is identified. Patient was not able to tolerate sitting upright. Upon lean forward, fluid is no longer visible from the posterior approach. Thoracentesis could not be performed. IMPRESSION: 1. Left pleural effusion is not visible with the patient sitting upright and leaning forward. 2. Thoracentesis could not be performed. Electronically Signed   By: San Morelle M.D.   On: 05/05/2022 11:26   US ABDOMEN LIMITED RUQ (LIVER/GB)  Result Date: 05/04/2022 CLINICAL DATA:  [id number] Abdominal pain [id number] EXAM: ULTRASOUND ABDOMEN LIMITED RIGHT UPPER QUADRANT COMPARISON:  CT 05/04/2022 FINDINGS: Gallbladder: Cholelithiasis with largest stone measuring up to 13 mm. No wall thickening or pericholecystic fluid. The gallbladder is nondilated. Negative sonographic Murphy sign. Common bile duct:  Diameter: 4.7 mm, normal.  No intrahepatic ductal dilation. Liver: No focal lesion identified. Within normal limits in parenchymal echogenicity. Portal vein is patent on color Doppler imaging with normal direction of blood flow towards the liver. Other: Pericardial effusion and right pleural effusion. IMPRESSION: Cholelithiasis with largest stone measuring up to 13 mm. No evidence of acute cholecystitis. Pericardial effusion and right pleural effusion noted. Electronically Signed   By: Maurine Simmering M.D.   On: 05/04/2022 16:24   CT Angio Chest/Abd/Pel for Dissection W and/or Wo Contrast  Result Date: 05/04/2022 CLINICAL DATA:  Upper abdominal pain radiating to the back since yesterday. Clinical concern for acute aortic syndrome. EXAM: CT ANGIOGRAPHY CHEST, ABDOMEN AND PELVIS TECHNIQUE: Non-contrast CT of the chest was initially obtained. Multidetector CT imaging through the chest, abdomen and pelvis was performed using the standard protocol during bolus administration of intravenous contrast. Multiplanar reconstructed images and MIPs were obtained and reviewed to evaluate the vascular anatomy. RADIATION DOSE REDUCTION: This exam was performed according to the departmental dose-optimization program which includes automated exposure control, adjustment of the mA and/or kV according to patient size and/or use of iterative reconstruction technique. CONTRAST:  155m OMNIPAQUE IOHEXOL 350 MG/ML SOLN COMPARISON:  Abdomen and pelvis CT dated 12/31/2021. Chest radiographs dated 02/28/2022. FINDINGS: CTA CHEST FINDINGS Cardiovascular: Enlarged heart. Moderate-sized pericardial effusion measuring up to 0.9 cm in thickness. Mildly enlarged central pulmonary arteries with a main pulmonary artery diameter of 3.4 cm. Mitral valve annulus calcifications. Motion artifacts involving the heart and ascending thoracic aorta. No aneurysm or dissection seen. Mediastinum/Nodes: Normal appearing thyroid gland. Anterior mediastinal mass  on the left containing coarse calcifications. This mass measures 4.3 x 4.1 cm on image number [id number] and is separate from the thyroid gland and measures 3.9 cm in length on coronal image number 68/11. No enlarged lymph nodes elsewhere in the chest or axillary regions. Unremarkable esophagus. Lungs/Pleura: Small to moderate-sized left pleural effusion with associated compressive left lower lobe atelectasis. Mild dependent left upper lobe atelectasis. Minimal right pleural effusion and minimal right lower lobe atelectasis. Mildly prominent interstitial markings with bilateral centrilobular bullous changes. Musculoskeletal: Thoracic and lower cervical spine degenerative changes. Review of the MIP images confirms the above findings. CTA ABDOMEN AND PELVIS FINDINGS VASCULAR Aorta: Normal caliber aorta without aneurysm, dissection, vasculitis or significant stenosis. Celiac: Patent without evidence of aneurysm, dissection,  vasculitis or significant stenosis. SMA: Patent without evidence of aneurysm, dissection, vasculitis or significant stenosis. Renals: 2 renal arteries on both sides. Both renal arteries on each side are patent without evidence of aneurysm, dissection, vasculitis, fibromuscular dysplasia or significant stenosis. IMA: Patent without evidence of aneurysm, dissection, vasculitis or significant stenosis. Inflow: Patent without evidence of aneurysm, dissection, vasculitis or significant stenosis. Veins: No obvious venous abnormality within the limitations of this arterial phase study. Review of the MIP images confirms the above findings. NON-VASCULAR Hepatobiliary: 8 mm gallstone in the gallbladder neck without gallbladder wall thickening or pericholecystic fluid. Unremarkable liver. Pancreas: Unremarkable. No pancreatic ductal dilatation or surrounding inflammatory changes. Spleen: Normal in size without focal abnormality. Adrenals/Urinary Tract: Mild bilateral adrenal hyperplasia. Small cysts in both  kidneys without significant change. These are not need follow-up. Unremarkable urinary bladder and ureters. Stomach/Bowel: Large number of colonic diverticula without evidence of diverticulitis. Unremarkable stomach, small bowel and appendix. Lymphatic: Enlarged lymph nodes. Reproductive: Calcified uterine fibroids. The largest measures 4.9 cm. No adnexal masses. Other: No abdominal wall hernia or abnormality. No abdominopelvic ascites. Musculoskeletal: Lumbar spine degenerative changes. Review of the MIP images confirms the above findings. IMPRESSION: 1. No aortic aneurysm or dissection. 2. 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 This is separate from the thyroid gland. Differential considerations include germ cell neoplasm such as teratoma. This could be further evaluated with PET-CT or biopsy. 3. Cardiomegaly with a moderate-sized pericardial effusion. 4. Small to moderate-sized left pleural effusion with associated compressive left lower lobe atelectasis. 5. Minimal right pleural effusion and minimal right lower lobe atelectasis. 6. Centrilobular emphysema. 7. Cholelithiasis. 8. Colonic diverticulosis. Electronically Signed   By: Claudie Revering M.D.   On: 05/04/2022 14:55    Labs:  CBC: Recent Labs    02/28/22 1018 04/03/22 0806 05/04/22 1225 05/05/22 0548  WBC 5.9 7.4 6.7 5.8  HGB 11.3* 11.3* 10.1* 9.8*  HCT 38.0 37.2 33.6* 32.3*  PLT 212 172 302 312    COAGS: Recent Labs    05/04/22 1557  INR 1.1  APTT 28    BMP: Recent Labs    02/28/22 1018 04/03/22 0806 05/04/22 1225 05/05/22 0548  NA 138 140 141 141  K 3.9 3.8 3.8 3.8  CL 104 105 106 104  CO2 '28 27 29 29  '$ GLUCOSE 91 122* 105* 95  BUN 21 30* 21 21  CALCIUM 9.4 9.3 9.3 9.2  CREATININE 0.94 1.00 0.96 1.01*  GFRNONAA >60 59* >60 58*    LIVER FUNCTION TESTS: Recent Labs    12/17/21 0356 12/31/21 1530 04/03/22 0806 05/04/22 1225  BILITOT 0.4 0.6 0.4 0.6  AST 12* '23 17 19  '$ ALT  '9 19 12 16  '$ ALKPHOS 74 64 73 73  PROT 6.7 7.4 7.5 7.8  ALBUMIN 3.2* 3.6 3.6 3.5    TUMOR MARKERS: No results for input(s): "AFPTM", "CEA", "CA199", "CHROMGRNA" in the last 8760 hours.  Assessment and Plan:  Marlyce Mcdougald is a 76 yo female with PMH of hypertension, depression, anxiety, developmental delay, OSA, diverticulitis, and chronic abdominal pain being seen today for an anterior mediastinal mass. Case has been reviewed and approved by Dr Denna Haggard for CT-guided biopsy of mediastinal mass.  Risks and benefits of CT-guided mediastinal mass biopsy was discussed with the patient and patient's sister, [name], including, but not limited to bleeding, infection, damage to adjacent structures or low yield requiring additional tests.  All of the questions were answered and there is agreement  to proceed.  Consent signed and in IR APP office.   Thank you for this interesting consult.  I greatly enjoyed meeting Nakshatra O Hurrell and look forward to participating in their care.  A copy of this report was sent to the requesting provider on this date.  Electronically Signed: Lura Em, PA-C 05/05/2022, 4:59 PM   I spent a total of 40 Minutes    in face to face in clinical consultation, greater than 50% of which was counseling/coordinating care for anterior mediastinal mass.

## 2022-05-05 NOTE — Progress Notes (Signed)
  Progress Note   Patient: Taylor Johnson SEG:[id number] DOB: 03/25/1946 DOA: 05/04/2022     1 DOS: the patient was seen and examined on 05/05/2022   Brief hospital course: Taylor Johnson is a 76 y.o. female with medical history significant of hypertension, depression with anxiety, OSA on CPAP, diverticulitis, developmental delay, obesity obesity BMI 37.76, chronic abdominal pain, who presents with chest pain. CTA showed mediastinal mass and pericardial effusion and pleural effusion.  Patient is seen by oncology, paracentesis was attempted without success due to patient agitation and small amount of fluids.  Patient is scheduled to have biopsy on 10/20.  Assessment and Plan: * Mediastinal mass Pleural effusion Pericardial effusion Pleural effusion and pericardial effusion probably are secondary to lung mass.  Most likely malignancy. Paracentesis was attempted, without success. Echocardiogram is pending, CT scan does not have any evidence cardiac tamponade. Patient is scheduled for CT-guided lung biopsy tomorrow. Patient most likely will discharge home tomorrow after this is performed.  Small airway obstructions. CT chest showed significant small airway obstructions, consistent with aspiration.  Obtain speech evaluation.  Patient clinically does not seem to have a bacterial pneumonia.  Benign essential HTN Continue hydralazine.  Abdominal pain, chronic, generalized Diverticulosis. CT scan abdomen/pelvis did not show acute changes, patient has diffuse chronic diverticulosis without diverticulitis.  Depression with anxiety Continue home olanzapine  Obesity with body mass index (BMI) of 30.0 to 39.9  BMI= 37.76   and BW= 99.8 kg -Diet and exercise.            Subjective:  Patient is doing well today, does not have any cough or shortness of breath.  Physical Exam: Vitals:   05/05/22 0433 05/05/22 0805 05/05/22 1008 05/05/22 1034  BP: (!) 158/68 132/78 (!) 130/49 (!) 148/53   Pulse: 78 86 93 88  Resp: '20  20 20  '$ Temp: 98.6 F (37 C)     TempSrc:      SpO2: 100% 95% 93% 93%  Weight:      Height:       General exam: Appears calm and comfortable  Respiratory system: Clear to auscultation. Respiratory effort normal. Cardiovascular system: S1 & S2 heard, RRR. No JVD, murmurs, rubs, gallops or clicks. No pedal edema. Gastrointestinal system: Abdomen is nondistended, soft and nontender. No organomegaly or masses felt. Normal bowel sounds heard. Central nervous system: Alert and oriented x2. No focal neurological deficits. Extremities: Symmetric 5 x 5 power. Skin: No rashes, lesions or ulcers Psychiatry: Mood & affect appropriate.   Data Reviewed:  CT scan and the lab results reviewed.  Family Communication: Sisters updated at the bedside.  Disposition: Status is: Inpatient Remains inpatient appropriate because: Severity of disease, inpatient procedure.  Planned Discharge Destination: Home    Time spent: 35 minutes  Author: Sharen Hones, MD 05/05/2022 3:07 PM  For on call review [email or web address].

## 2022-05-06 ENCOUNTER — Inpatient Hospital Stay: Payer: Medicare Other

## 2022-05-06 DIAGNOSIS — J90 Pleural effusion, not elsewhere classified: Secondary | ICD-10-CM | POA: Diagnosis not present

## 2022-05-06 DIAGNOSIS — J9859 Other diseases of mediastinum, not elsewhere classified: Secondary | ICD-10-CM | POA: Diagnosis not present

## 2022-05-06 DIAGNOSIS — I3139 Other pericardial effusion (noninflammatory): Secondary | ICD-10-CM | POA: Diagnosis not present

## 2022-05-06 LAB — CBC WITH DIFFERENTIAL/PLATELET
Abs Immature Granulocytes: 0.02 10*3/uL (ref 0.00–0.07)
Basophils Absolute: 0 10*3/uL (ref 0.0–0.1)
Basophils Relative: 1 %
Eosinophils Absolute: 0.1 10*3/uL (ref 0.0–0.5)
Eosinophils Relative: 2 %
HCT: 32.2 % — ABNORMAL LOW (ref 36.0–46.0)
Hemoglobin: 9.8 g/dL — ABNORMAL LOW (ref 12.0–15.0)
Immature Granulocytes: 0 %
Lymphocytes Relative: 22 %
Lymphs Abs: 1.4 10*3/uL (ref 0.7–4.0)
MCH: 27.9 pg (ref 26.0–34.0)
MCHC: 30.4 g/dL (ref 30.0–36.0)
MCV: 91.7 fL (ref 80.0–100.0)
Monocytes Absolute: 1 10*3/uL (ref 0.1–1.0)
Monocytes Relative: 17 %
Neutro Abs: 3.7 10*3/uL (ref 1.7–7.7)
Neutrophils Relative %: 58 %
Platelets: 322 10*3/uL (ref 150–400)
RBC: 3.51 MIL/uL — ABNORMAL LOW (ref 3.87–5.11)
RDW: 15.1 % (ref 11.5–15.5)
WBC: 6.2 10*3/uL (ref 4.0–10.5)
nRBC: 0 % (ref 0.0–0.2)

## 2022-05-06 LAB — PROTIME-INR
INR: 1.2 (ref 0.8–1.2)
Prothrombin Time: 15.4 seconds — ABNORMAL HIGH (ref 11.4–15.2)

## 2022-05-06 LAB — PROCALCITONIN: Procalcitonin: <0.1 ng/mL

## 2022-05-06 LAB — BETA HCG QUANT (REF LAB): hCG Quant: <1 m[IU]/mL

## 2022-05-06 MED ORDER — AMOXICILLIN-POT CLAVULANATE 875-125 MG PO TABS
1.0000 | ORAL_TABLET | Freq: Two times a day (BID) | ORAL | Status: DC
  Administered 2022-05-06 – 2022-05-07 (×2): 1 via ORAL
  Filled 2022-05-06 (×2): qty 1

## 2022-05-06 MED ORDER — FENTANYL CITRATE (PF) 100 MCG/2ML IJ SOLN
INTRAMUSCULAR | Status: AC
  Filled 2022-05-06: qty 2

## 2022-05-06 MED ORDER — MIDAZOLAM HCL 2 MG/2ML IJ SOLN
INTRAMUSCULAR | Status: AC
  Filled 2022-05-06: qty 4

## 2022-05-06 MED ORDER — HEPARIN SODIUM (PORCINE) 5000 UNIT/ML IJ SOLN
5000.0000 [IU] | Freq: Three times a day (TID) | INTRAMUSCULAR | Status: DC
  Administered 2022-05-07: 5000 [IU] via SUBCUTANEOUS
  Filled 2022-05-06: qty 1

## 2022-05-06 MED ORDER — LIDOCAINE HCL (PF) 1 % IJ SOLN
INTRAMUSCULAR | Status: AC | PRN
  Administered 2022-05-06: 1 mL

## 2022-05-06 MED ORDER — FENTANYL CITRATE (PF) 100 MCG/2ML IJ SOLN
INTRAMUSCULAR | Status: AC | PRN
  Administered 2022-05-06: 50 ug via INTRAVENOUS

## 2022-05-06 MED ORDER — MIDAZOLAM HCL 2 MG/2ML IJ SOLN
INTRAMUSCULAR | Status: AC | PRN
  Administered 2022-05-06: 1 mg via INTRAVENOUS

## 2022-05-06 NOTE — Plan of Care (Signed)

## 2022-05-06 NOTE — Procedures (Signed)
Interventional Radiology Procedure Note  Procedure: CT guided bx of anterior mediastinal mass  Complications: None  Estimated Blood Loss: None  Recommendations: - Path sent   Signed,  Criselda Peaches, MD

## 2022-05-06 NOTE — Progress Notes (Signed)
Taylor Johnson   DOB:January 12, 1946   XF#:[id number]    Subjective: Patient unable to tolerate thoracentesis yesterday.  Otherwise, No acute events overnight.  Denies any chest pain.  Denies any worsening abdominal pain. Low-grade temperature x1.  Denies any worsening cough.    Objective:  Vitals:   05/06/22 1740 05/06/22 1935  BP: (!) 153/49 (!) 112/42  Pulse: 80 82  Resp: 18 20  Temp: 97.9 F (36.6 C) 98.9 F (37.2 C)  SpO2: 97% 93%    No intake or output data in the 24 hours ending 05/06/22 2214  Physical Exam Vitals and nursing note reviewed.  HENT:     Head: Normocephalic and atraumatic.     Mouth/Throat:     Pharynx: Oropharynx is clear.  Eyes:     Extraocular Movements: Extraocular movements intact.     Pupils: Pupils are equal, round, and reactive to light.  Cardiovascular:     Rate and Rhythm: Normal rate and regular rhythm.  Pulmonary:     Comments: Decreased breath sounds bilaterally.  Abdominal:     Palpations: Abdomen is soft.  Musculoskeletal:        General: Normal range of motion.     Cervical back: Normal range of motion.  Skin:    General: Skin is warm.  Neurological:     General: No focal deficit present.     Mental Status: She is alert and oriented to person, place, and time.  Psychiatric:        Behavior: Behavior normal.        Judgment: Judgment normal.      Labs:  Lab Results  Component Value Date   WBC 6.2 05/06/2022   HGB 9.8 (L) 05/06/2022   HCT 32.2 (L) 05/06/2022   MCV 91.7 05/06/2022   PLT 322 05/06/2022   NEUTROABS 3.7 05/06/2022    Lab Results  Component Value Date   NA 141 05/05/2022   K 3.8 05/05/2022   CL 104 05/05/2022   CO2 29 05/05/2022    Studies:  CT BIOPSY  Result Date: 05/06/2022 INDICATION: 76 year old female with anterior mediastinal mass EXAM: CT-guided biopsy MEDICATIONS: None. ANESTHESIA/SEDATION: Moderate (conscious) sedation was employed during this procedure. A total of Versed 1 mg and Fentanyl 50 mcg was  administered intravenously by the radiology nurse. Total intra-service moderate Sedation Time: 16 minutes. The patient's level of consciousness and vital signs were monitored continuously by radiology nursing throughout the procedure under my direct supervision. COMPLICATIONS: None immediate. PROCEDURE: Informed written consent was obtained from the patient after a thorough discussion of the procedural risks, benefits and alternatives. All questions were addressed. Maximal Sterile Barrier Technique was utilized including caps, mask, sterile gowns, sterile gloves, sterile drape, hand hygiene and skin antiseptic. A timeout was performed prior to the initiation of the procedure. A planning axial CT scan was performed. The anterior mediastinal mass was successfully identified. A suitable skin entry site was selected and marked. Local anesthesia was attained by infiltration with 1% lidocaine. A small dermatotomy was made. Under intermittent CT guidance, a 17 gauge introducer needle was advanced and positioned at the margin of the mass. 18 gauge core biopsies were then obtained coaxially. Biopsy specimens were placed in formalin and delivered to pathology for further analysis. Post biopsy CT imaging demonstrates no evidence of pneumothorax, mediastinal hemorrhage or other complication. The patient tolerated the procedure well. IMPRESSION: Successful CT-guided biopsy of anterior mediastinal mass. Electronically Signed   By: Jacqulynn Cadet M.D.   On: 05/06/2022 16:57  DG Chest Port 1 View  Result Date: 05/05/2022 CLINICAL DATA:  Status post attempted thoracentesis EXAM: PORTABLE CHEST 1 VIEW COMPARISON:  Previous chest radiographs done on 02/28/2022, CT done on 05/04/2022 FINDINGS: Transverse diameter of heart is increased. Central pulmonary vessels are prominent. There are no signs of alveolar pulmonary edema. There is increased density in left lower lung field. There is blunting of left lateral CP angle. There is  no pneumothorax. IMPRESSION: Cardiomegaly. Central pulmonary vessels are prominent without signs of alveolar pulmonary edema. Increased density in left lower lung fields suggests pleural effusion and possibly underlying atelectasis/pneumonia. Electronically Signed   By: Elmer Picker M.D.   On: 05/05/2022 11:30   [name] CHEST (PLEURAL EFFUSION)  Result Date: 05/05/2022 CLINICAL DATA:  Pleural effusion. EXAM: CHEST ULTRASOUND COMPARISON:  One-view chest x-ray 05/05/2022 FINDINGS: A left pleural effusion is identified. Patient was not able to tolerate sitting upright. Upon lean forward, fluid is no longer visible from the posterior approach. Thoracentesis could not be performed. IMPRESSION: 1. Left pleural effusion is not visible with the patient sitting upright and leaning forward. 2. Thoracentesis could not be performed. Electronically Signed   By: San Morelle M.D.   On: 05/05/2022 56:83    76 year old female patient with multiple medical problems-including CHF; obesity-intellectual disability and history of chronic abdominal pain is currently admitted to hospital for similar complaints.  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   Currently awaiting biopsy this morning.   #Chronic abdominal pain-unclear etiology no acute findings noted-   #CHF/moderate-sized pericardial effusion -mild to moderate left pleural effusions-unable to tolerate thoracentesis.  2D echo no cardiac tamponade.  #Mild anemia hemoglobin 9. ?  Etiology.  #Low-grade fever-defer further work-up to primary team.   #Intellectual disability  The plan of care was discussed with the patient's sister by the bedside.  Also discussed with Dr. Roosevelt Locks.   Addendum: Patient s/p anterior mediastinal mass biopsy.  We will plan  outpatient follow-up/including PET scan outpatient.    [name], MD 05/06/2022  10:14 PM

## 2022-05-06 NOTE — Consult Note (Signed)
MEDICATION RELATED CONSULT NOTE - INITIAL   Pharmacy Consult for post-IR procedure consult for resuming antithrombotics  No Known Allergies  Patient Measurements: Height: '5\' 4"'$  (162.6 cm) Weight: 99.8 kg (220 lb) IBW/kg (Calculated) : 54.7  Vital Signs: Temp: 97.9 F (36.6 C) (10/20 1740) Temp Source: Oral (10/20 1740) BP: 153/49 (10/20 1740) Pulse Rate: 80 (10/20 1740) Intake/Output from previous day: 10/19 0701 - 10/20 0700 In: 150 [P.O.:150] Out: -  Intake/Output from this shift: No intake/output data recorded.  Labs: Recent Labs    05/04/22 1225 05/04/22 1557 05/05/22 0548 05/06/22 0415  WBC 6.7  --  5.8 6.2  HGB 10.1*  --  9.8* 9.8*  HCT 33.6*  --  32.3* 32.2*  PLT 302  --  312 322  APTT  --  28  --   --   CREATININE 0.96  --  1.01*  --   ALBUMIN 3.5  --   --   --   PROT 7.8  --   --   --   AST 19  --   --   --   ALT 16  --   --   --   ALKPHOS 73  --   --   --   BILITOT 0.6  --   --   --    Estimated Creatinine Clearance: 55.2 mL/min (A) (by C-G formula based on SCr of 1.01 mg/dL (H)).   Microbiology: No results found for this or any previous visit (from the past 720 hour(s)).  Medical History: Past Medical History:  Diagnosis Date   Abdominal pain 01/27/2017   Anxiety    Anxiety, generalized 05/11/2015   Cough 06/02/2016   Chronic - followed by Pulmonary   Cough 06/02/2016   Chronic - followed by Pulmonary   Depression    Developmental delay    Diverticulitis    Diverticulosis of colon 05/11/2015   Dysphagia 10/15/2015   Routine Ba Swallow normal; rec modified barium swallow    GERD (gastroesophageal reflux disease)    Hematochezia 11/07/2019   Hypertension    Hypoxia 09/12/2015   Leg swelling    Localized edema 04/15/2016   Microscopic hematuria 04/15/2016   Nausea and vomiting 01/29/2017   Orthostatic hypotension 03/16/2017   OSA on CPAP 11/18/2015   CPAP @ 18 cm H2O started 02/2016   Rectal bleeding 10/15/2018   Urge incontinence  of urine 05/12/2015    Medications:  Heparin 5,000 subQ q8H  Assessment: 76 yo F with PMH hypertension, depression/anxiety, OSA on CPAP, diverticulitis, developmental delay, obesity underwent CT-guided biopsy of anterior mediastinal mass. Bleeding risk of procedure assessed to be "Standard" per MD. Subcutaneous heparin for DVT ppx is the only antithrombotic patient has ordered. No NSAIDs ordered.  Plan:  Resume DVT prophylaxis with subQ heparin tomorrow AM  Dara Hoyer, PharmD PGY-1 Pharmacy Resident 05/06/2022 6:09 PM

## 2022-05-06 NOTE — Progress Notes (Signed)
  Progress Note   Patient: Taylor Johnson GEX:[id number] DOB: Nov 16, 1945 DOA: 05/04/2022     2 DOS: the patient was seen and examined on 05/06/2022   Brief hospital course: Aaralynn Shepheard Armenteros is a 76 y.o. female with medical history significant of hypertension, depression with anxiety, OSA on CPAP, diverticulitis, developmental delay, obesity obesity BMI 37.76, chronic abdominal pain, who presents with chest pain. CTA showed mediastinal mass and pericardial effusion and pleural effusion.  Patient is seen by oncology, paracentesis was attempted without success due to patient agitation and small amount of fluids.  Patient is scheduled to have biopsy on 10/20.  Assessment and Plan: Mediastinal mass Pleural effusion Pericardial effusion Pleural effusion and pericardial effusion probably are secondary to lung mass.  Most likely malignancy. Paracentesis was attempted, without success. Echocardiogram confirmed pericardial effusion, ejection fraction normal, no evidence of cardiac tamponade. CT-guided biopsy is scheduled for today.   Bilateral lower lobe atelectasis. Small airway obstruction on CT abdomen/pelvis. Patient had low-grade fever last night with temperature 100.5.  Procalcitonin level less than 0.1. Could not rule out aspiration.  Speech therapy has seen patient, adequate oral pharyngeal phase swallowing function. For now, I will treat with 5 days of Augmentin.   Benign essential HTN Continue hydralazine.   Abdominal pain, chronic, generalized Diverticulosis. CT scan abdomen/pelvis did not show acute changes, patient has diffuse chronic diverticulosis without diverticulitis.   Depression with anxiety Continue home olanzapine   Obesity with body mass index (BMI) of 30.0 to 39.9   BMI= 37.76   and BW= 99.8 kg -Diet and exercise.          Subjective:  Patient does not have any short of breath or cough.  Had a low-grade fever last night.  Physical Exam: Vitals:   05/05/22  1535 05/05/22 2038 05/06/22 0344 05/06/22 0802  BP: (!) 139/54 (!) 120/40 (!) 128/55 127/64  Pulse: 84 87 76 78  Resp: (!) '24 20 20 '$ (!) 22  Temp: 99.1 F (37.3 C) (!) 100.5 F (38.1 C) 98.8 F (37.1 C) 99.2 F (37.3 C)  TempSrc: Oral Oral Oral Oral  SpO2: 93% 93% 97% 93%  Weight:      Height:       General exam: Appears calm and comfortable  Respiratory system: Mild crackles in left base. Respiratory effort normal. Cardiovascular system: S1 & S2 heard, RRR. No JVD, murmurs, rubs, gallops or clicks. No pedal edema. Gastrointestinal system: Abdomen is nondistended, soft and nontender. No organomegaly or masses felt. Normal bowel sounds heard. Central nervous system: Alert and oriented x2. No focal neurological deficits. Extremities: Symmetric 5 x 5 power. Skin: No rashes, lesions or ulcers Psychiatry: Mood & affect appropriate.   Data Reviewed:  Reviewed CT scan results again, reviewed lab results.  Family Communication: Daughter updated at bedside.  Disposition: Status is: Inpatient Remains inpatient appropriate because: Severity of disease, pending procedure.  Planned Discharge Destination: Home    Time spent: 35 minutes  Author: Sharen Hones, MD 05/06/2022 12:54 PM  For on call review [email or web address].

## 2022-05-07 ENCOUNTER — Telehealth: Payer: Self-pay | Admitting: Internal Medicine

## 2022-05-07 DIAGNOSIS — J9859 Other diseases of mediastinum, not elsewhere classified: Secondary | ICD-10-CM

## 2022-05-07 DIAGNOSIS — I3139 Other pericardial effusion (noninflammatory): Secondary | ICD-10-CM | POA: Diagnosis not present

## 2022-05-07 DIAGNOSIS — J90 Pleural effusion, not elsewhere classified: Secondary | ICD-10-CM | POA: Diagnosis not present

## 2022-05-07 LAB — AFP TUMOR MARKER: AFP, Serum, Tumor Marker: <1.8 ng/mL (ref 0.0–9.2)

## 2022-05-07 MED ORDER — AMOXICILLIN-POT CLAVULANATE 875-125 MG PO TABS: 1.0000 | ORAL_TABLET | Freq: Two times a day (BID) | ORAL | 0 refills | Status: DC

## 2022-05-07 NOTE — Telephone Encounter (Signed)
M-please schedule the patient for follow-up in the clinic-November 3rd; MD; no labs-   Please schedule a PET scan prior to next visit.   Please speak to patient's Sister [name] for appointments-as patient has intellectual disability.  Patient also scheduled for tumor conference on nov 2nd.   GB

## 2022-05-07 NOTE — Progress Notes (Signed)
Taylor Johnson   DOB:Sep 27, 1945   IR#:[id number]    Subjective: Patient resting in the bed comfortably.  No acute events overnight.  Denies any new onset of pain.  Chronic abdominal pain.  No diarrhea.  No fever no chills.  Objective:  Vitals:   05/07/22 0438 05/07/22 0822  BP: (!) 126/51 (!) 126/44  Pulse: 83 88  Resp: 18 18  Temp: 98.6 F (37 C) 99.4 F (37.4 C)  SpO2: 96% 90%    No intake or output data in the 24 hours ending 05/07/22 2205  Physical Exam Vitals and nursing note reviewed.  HENT:     Head: Normocephalic and atraumatic.     Mouth/Throat:     Pharynx: Oropharynx is clear.  Eyes:     Extraocular Movements: Extraocular movements intact.     Pupils: Pupils are equal, round, and reactive to light.  Cardiovascular:     Rate and Rhythm: Normal rate and regular rhythm.  Pulmonary:     Comments: Decreased breath sounds bilaterally.  Abdominal:     Palpations: Abdomen is soft.  Musculoskeletal:        General: Normal range of motion.     Cervical back: Normal range of motion.  Skin:    General: Skin is warm.  Neurological:     General: No focal deficit present.     Mental Status: She is alert and oriented to person, place, and time.  Psychiatric:        Behavior: Behavior normal.        Judgment: Judgment normal.      Labs:  Lab Results  Component Value Date   WBC 6.2 05/06/2022   HGB 9.8 (L) 05/06/2022   HCT 32.2 (L) 05/06/2022   MCV 91.7 05/06/2022   PLT 322 05/06/2022   NEUTROABS 3.7 05/06/2022    Lab Results  Component Value Date   NA 141 05/05/2022   K 3.8 05/05/2022   CL 104 05/05/2022   CO2 29 05/05/2022    Studies:  CT BIOPSY  Result Date: 05/06/2022 INDICATION: 76 year old female with anterior mediastinal mass EXAM: CT-guided biopsy MEDICATIONS: None. ANESTHESIA/SEDATION: Moderate (conscious) sedation was employed during this procedure. A total of Versed 1 mg and Fentanyl 50 mcg was administered intravenously by the radiology nurse.  Total intra-service moderate Sedation Time: 16 minutes. The patient's level of consciousness and vital signs were monitored continuously by radiology nursing throughout the procedure under my direct supervision. COMPLICATIONS: None immediate. PROCEDURE: Informed written consent was obtained from the patient after a thorough discussion of the procedural risks, benefits and alternatives. All questions were addressed. Maximal Sterile Barrier Technique was utilized including caps, mask, sterile gowns, sterile gloves, sterile drape, hand hygiene and skin antiseptic. A timeout was performed prior to the initiation of the procedure. A planning axial CT scan was performed. The anterior mediastinal mass was successfully identified. A suitable skin entry site was selected and marked. Local anesthesia was attained by infiltration with 1% lidocaine. A small dermatotomy was made. Under intermittent CT guidance, a 17 gauge introducer needle was advanced and positioned at the margin of the mass. 18 gauge core biopsies were then obtained coaxially. Biopsy specimens were placed in formalin and delivered to pathology for further analysis. Post biopsy CT imaging demonstrates no evidence of pneumothorax, mediastinal hemorrhage or other complication. The patient tolerated the procedure well. IMPRESSION: Successful CT-guided biopsy of anterior mediastinal mass. Electronically Signed   By: Jacqulynn Cadet M.D.   On: 05/06/2022 16:57    #  75 year old female patient with multiple medical problems-including CHF; obesity-intellectual disability and history of chronic abdominal pain is currently admitted to hospital for similar complaints.  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  Results pending.   #CHF/moderate-sized pericardial effusion -mild to moderate left pleural effusions -thoracentesis not performed lack of patient cooperation.  2D echo showed no evidence of any cardiac tamponade.   #Mild anemia hemoglobin 9. ?  Etiology.  Await work-up.   #Intellectual disability   #We will have the patient follow-up in the next 1 to 2 weeks to review the results of the biopsy.  Patient will also need outpatient PET scan.  Discussed the above plan of care with the patient and her sister in detail.   [name], MD 05/07/2022  10:05 PM

## 2022-05-07 NOTE — Progress Notes (Signed)
Taylor Johnson to be D/C'd Home per MD order.  Discussed with the patient and all questions fully answered.  VSS, Skin clean, dry and intact without evidence of skin break down, no evidence of skin tears noted. IV catheter discontinued intact. Site without signs and symptoms of complications. Dressing and pressure applied.  An After Visit Summary was printed and given to the patient. Patient sister prescription sent to pharmacy.  D/c education completed with patient/family including follow up instructions, medication list, d/c activities limitations if indicated, with other d/c instructions as indicated by MD - patient able to verbalize understanding, all questions fully answered.   Patient instructed to return to ED, call 911, or call MD for any changes in condition.   Patient escorted via [name], and D/C home via private auto.  Manuella Ghazi 05/07/2022 11:44 AM

## 2022-05-07 NOTE — Discharge Summary (Signed)
Physician Discharge Summary   Patient: Taylor Johnson MRN: [id number] DOB: [birth year]  Admit date:     05/04/2022  Discharge date: 05/07/22  Discharge Physician: Sharen Hones   PCP: [name], MD   Recommendations at discharge:   Follow-up with PCP in 1 week. Follow-up with oncology as scheduled.  Discharge Diagnoses: Principal Problem:   Mediastinal mass Active Problems:   Pleural effusion   Pericardial effusion   Benign essential HTN   Abdominal pain, chronic, generalized   Depression with anxiety   OSA on CPAP   Obesity with body mass index (BMI) of 30.0 to 39.9 Aspiration pneumonitis. Anemia of chronic disease. Resolved Problems:   * No resolved hospital problems. *  Hospital Course: Taylor Johnson is a 76 y.o. female with medical history significant of hypertension, depression with anxiety, OSA on CPAP, diverticulitis, developmental delay, obesity obesity BMI 37.76, chronic abdominal pain, who presents with chest pain. CTA showed mediastinal mass and pericardial effusion and pleural effusion.  Patient is seen by oncology, paracentesis was attempted without success due to patient agitation and small amount of fluids.  Patient is scheduled to have biopsy on 10/20. Biopsy was performed on 10/20 at evening time.  No complication.  Medically stable to be discharged. Assessment and Plan: Mediastinal mass Pleural effusion Pericardial effusion Pleural effusion and pericardial effusion probably are secondary to lung mass.  Most likely malignancy. Paracentesis was attempted, without success. Echocardiogram confirmed pericardial effusion, ejection fraction normal, no evidence of cardiac tamponade. CT-guided biopsy was performed, patient will be followed by oncology to review the results.     Bilateral lower lobe atelectasis. Small airway obstruction on CT abdomen/pelvis. Aspiration pneumonitis. Ruled in Patient initial CT scan showed lower airway obstructions, suggesting  aspiration.  She was seen by speech therapy, no oral or pharyngeal phase of dysphagia.  Most likely due to acid reflux.  Patient is spiked a fever while in the hospital, as result, she is started on Augmentin.  No additional fever afterwards.   Benign essential HTN Continue hydralazine.   Abdominal pain, chronic, generalized Diverticulosis. CT scan abdomen/pelvis did not show acute changes, patient has diffuse chronic diverticulosis without diverticulitis.   Depression with anxiety Continue home olanzapine   Obesity with body mass index (BMI) of 30.0 to 39.9   BMI= 37.76   and BW= 99.8 kg -Diet and exercise.         Consultants: Oncology. Procedures performed: Lung biopsy. Disposition: Home Diet recommendation:  Discharge Diet Orders (From admission, onward)     Start     Ordered   05/07/22 0000  Diet - low sodium heart healthy        05/07/22 1009           Cardiac diet DISCHARGE MEDICATION: Allergies as of 05/07/2022   No Known Allergies      Medication List     STOP taking these medications    ondansetron 4 MG disintegrating tablet Commonly known as: ZOFRAN-ODT   ondansetron 4 MG tablet Commonly known as: ZOFRAN       TAKE these medications    amoxicillin-clavulanate 875-125 MG tablet Commonly known as: AUGMENTIN Take 1 tablet by mouth every 12 (twelve) hours for 4 days.   hydrALAZINE 25 MG tablet Commonly known as: APRESOLINE NEW PRESCRIPTION REQUEST:: TAKE ONE TABLET BY MOUTH THREE TIMES DAILY   meclizine 12.5 MG tablet Commonly known as: ANTIVERT TAKE 1 TABLET BY MOUTH 3 TIMES DAILY AS NEEDED FOR DIZZINESS   metoCLOPramide 5  MG tablet Commonly known as: REGLAN TAKE ONE TABLET BY MOUTH THREE TIMES A DAY   OLANZapine 2.5 MG tablet Commonly known as: ZYPREXA Take 1 tablet (2.5 mg total) by mouth at bedtime.   pantoprazole 40 MG tablet Commonly known as: Protonix Take 1 tablet (40 mg total) by mouth 2 (two) times daily.                Discharge Care Instructions  (From admission, onward)           Start     Ordered   05/07/22 0000  Discharge wound care:       Comments: Follow with oncolgy   05/07/22 1009            Follow-up Information     [name], MD Follow up in 1 week(s).   Specialty: Internal Medicine Contact information: [address] [place] [address] [phone]         [name], MD Follow up.   Specialties: Internal Medicine, Oncology Why: as scheduled Contact information: [place] [address] [phone]                Discharge Exam: Danley Danker Weights   05/04/22 1224  Weight: 99.8 kg   General exam: Appears calm and comfortable  Respiratory system: Clear to auscultation. Respiratory effort normal. Cardiovascular system: S1 & S2 heard, RRR. No JVD, murmurs, rubs, gallops or clicks. No pedal edema. Gastrointestinal system: Abdomen is nondistended, soft and nontender. No organomegaly or masses felt. Normal bowel sounds heard. Central nervous system: Alert and oriented. No focal neurological deficits. Extremities: Symmetric 5 x 5 power. Skin: No rashes, lesions or ulcers Psychiatry: Judgement and insight appear normal. Mood & affect appropriate.    Condition at discharge: good  The results of significant diagnostics from this hospitalization (including imaging, microbiology, ancillary and laboratory) are listed below for reference.   Imaging Studies: CT BIOPSY  Result Date: 2022-05-24 INDICATION: 76 year old female with anterior mediastinal mass EXAM: CT-guided biopsy MEDICATIONS: None. ANESTHESIA/SEDATION: Moderate (conscious) sedation was employed during this procedure. A total of Versed 1 mg and Fentanyl 50 mcg was administered intravenously by the radiology nurse. Total intra-service moderate Sedation Time: 16 minutes. The patient's level of consciousness and vital signs were monitored continuously by  radiology nursing throughout the procedure under my direct supervision. COMPLICATIONS: None immediate. PROCEDURE: Informed written consent was obtained from the patient after a thorough discussion of the procedural risks, benefits and alternatives. All questions were addressed. Maximal Sterile Barrier Technique was utilized including caps, mask, sterile gowns, sterile gloves, sterile drape, hand hygiene and skin antiseptic. A timeout was performed prior to the initiation of the procedure. A planning axial CT scan was performed. The anterior mediastinal mass was successfully identified. A suitable skin entry site was selected and marked. Local anesthesia was attained by infiltration with 1% lidocaine. A small dermatotomy was made. Under intermittent CT guidance, a 17 gauge introducer needle was advanced and positioned at the margin of the mass. 18 gauge core biopsies were then obtained coaxially. Biopsy specimens were placed in formalin and delivered to pathology for further analysis. Post biopsy CT imaging demonstrates no evidence of pneumothorax, mediastinal hemorrhage or other complication. The patient tolerated the procedure well. IMPRESSION: Successful CT-guided biopsy of anterior mediastinal mass. Electronically Signed   By: Jacqulynn Cadet M.D.   On: May 24, 2022 16:57   ECHOCARDIOGRAM COMPLETE  Result Date: 05/05/2022    ECHOCARDIOGRAM REPORT   Patient Name:  Sherrilyn Rist Date of Exam: 05/04/2022 Medical Rec #:  [id number]     Height:       64.0 in Accession #:    [id number]    Weight:       220.0 lb Date of Birth:  1945/09/21     BSA:          2.037 m Patient Age:    69 years      BP:           142/61 mmHg Patient Gender: F             HR:           64 bpm. Exam Location:  [facility] Procedure: 2D Echo, Cardiac Doppler and Color Doppler Indications:     I31.3 Pericardial, Effusion  History:         Patient has prior history of Echocardiogram examinations, most                  recent 03/15/2022.  Signs/Symptoms:Murmur; Risk                  Factors:Hypertension and Sleep Apnea.  Sonographer:     Cresenciano Lick RDCS Referring Phys:  [place] Diagnosing Phys: Ida Rogue MD IMPRESSIONS  1. Left ventricular ejection fraction, by estimation, is 60 to 65%. The left ventricle has normal function. The left ventricle has no regional wall motion abnormalities. There is mild left ventricular hypertrophy. Left ventricular diastolic parameters are consistent with Grade I diastolic dysfunction (impaired relaxation).  2. Right ventricular systolic function is normal. The right ventricular size is normal. Tricuspid regurgitation signal is inadequate for assessing PA pressure.  3. Left atrial size was moderately dilated.  4. Small to moderate pericardial effusionm measuring 1.47 cm. The pericardial effusion is circumferential. There is no evidence of cardiac tamponade.  5. The mitral valve is normal in structure. No evidence of mitral valve regurgitation. No evidence of mitral stenosis.  6. The aortic valve is normal in structure. Aortic valve regurgitation is not visualized. No aortic stenosis is present.  7. The inferior vena cava is dilated in size with <50% respiratory variability, suggesting right atrial pressure of 15 mmHg. FINDINGS  Left Ventricle: Left ventricular ejection fraction, by estimation, is 60 to 65%. The left ventricle has normal function. The left ventricle has no regional wall motion abnormalities. The left ventricular internal cavity size was normal in size. There is  mild left ventricular hypertrophy. Left ventricular diastolic parameters are consistent with Grade I diastolic dysfunction (impaired relaxation). Right Ventricle: The right ventricular size is normal. No increase in right ventricular wall thickness. Right ventricular systolic function is normal. Tricuspid regurgitation signal is inadequate for assessing PA pressure. Left Atrium: Left atrial size was moderately dilated.  Right Atrium: Right atrial size was normal in size. Pericardium: A moderately sized pericardial effusion is present. The pericardial effusion is circumferential. There is no evidence of cardiac tamponade. Mitral Valve: The mitral valve is normal in structure. Mild mitral annular calcification. No evidence of mitral valve regurgitation. No evidence of mitral valve stenosis. Tricuspid Valve: The tricuspid valve is normal in structure. Tricuspid valve regurgitation is not demonstrated. No evidence of tricuspid stenosis. Aortic Valve: The aortic valve is normal in structure. Aortic valve regurgitation is not visualized. No aortic stenosis is present. Aortic valve mean gradient measures 10.5 mmHg. Aortic valve peak gradient measures 20.3 mmHg. Pulmonic Valve: The pulmonic valve was normal in structure. Pulmonic valve regurgitation is not visualized.  No evidence of pulmonic stenosis. Aorta: The aortic root is normal in size and structure. Venous: The inferior vena cava is dilated in size with less than 50% respiratory variability, suggesting right atrial pressure of 15 mmHg. IAS/Shunts: No atrial level shunt detected by color flow Doppler.  LEFT VENTRICLE PLAX 2D LVIDd:         5.00 cm Diastology LVIDs:         3.40 cm LV e' medial:    7.83 cm/s LV PW:         1.00 cm LV E/e' medial:  13.1 LV IVS:        0.90 cm LV e' lateral:   7.51 cm/s                        LV E/e' lateral: 13.7  RIGHT VENTRICLE             IVC RV Basal diam:  3.30 cm     IVC diam: 2.30 cm RV S prime:     13.45 cm/s TAPSE (M-mode): 2.9 cm LEFT ATRIUM             Index        RIGHT ATRIUM           Index LA diam:        5.20 cm 2.55 cm/m   RA Area:     15.10 cm LA Vol (A2C):   64.1 ml 31.46 ml/m  RA Volume:   37.70 ml  18.51 ml/m LA Vol (A4C):   57.6 ml 28.27 ml/m LA Biplane Vol: 60.5 ml 29.70 ml/m  AORTIC VALVE AV Vmax:           225.25 cm/s AV Vmean:          150.000 cm/s AV VTI:            0.446 m AV Peak Grad:      20.3 mmHg AV Mean Grad:       10.5 mmHg LVOT Vmax:         200.00 cm/s LVOT Vmean:        128.000 cm/s LVOT VTI:          0.406 m LVOT/AV VTI ratio: 0.91  AORTA Ao Root diam: 3.10 cm MITRAL VALVE MV Area (PHT): 3.27 cm     SHUNTS MV Decel Time: 232 msec     Systemic VTI: 0.41 m MV E velocity: 102.55 cm/s MV A velocity: 119.50 cm/s MV E/A ratio:  0.86 Ida Rogue MD Electronically signed by Ida Rogue MD Signature Date/Time: 05/05/2022/4:28:08 PM    Final    DG Chest Port 1 View  Result Date: 05/05/2022 CLINICAL DATA:  Status post attempted thoracentesis EXAM: PORTABLE CHEST 1 VIEW COMPARISON:  Previous chest radiographs done on 02/28/2022, CT done on 05/04/2022 FINDINGS: Transverse diameter of heart is increased. Central pulmonary vessels are prominent. There are no signs of alveolar pulmonary edema. There is increased density in left lower lung field. There is blunting of left lateral CP angle. There is no pneumothorax. IMPRESSION: Cardiomegaly. Central pulmonary vessels are prominent without signs of alveolar pulmonary edema. Increased density in left lower lung fields suggests pleural effusion and possibly underlying atelectasis/pneumonia. Electronically Signed   By: Elmer Picker M.D.   On: 05/05/2022 11:30   [name] CHEST (PLEURAL EFFUSION)  Result Date: 05/05/2022 CLINICAL DATA:  Pleural effusion. EXAM: CHEST ULTRASOUND COMPARISON:  One-view chest x-ray 05/05/2022 FINDINGS: A left pleural effusion is identified.  Patient was not able to tolerate sitting upright. Upon lean forward, fluid is no longer visible from the posterior approach. Thoracentesis could not be performed. IMPRESSION: 1. Left pleural effusion is not visible with the patient sitting upright and leaning forward. 2. Thoracentesis could not be performed. Electronically Signed   By: San Morelle M.D.   On: 05/05/2022 11:26   US ABDOMEN LIMITED RUQ (LIVER/GB)  Result Date: 05/04/2022 CLINICAL DATA:  [id number] Abdominal pain [id number] EXAM: ULTRASOUND  ABDOMEN LIMITED RIGHT UPPER QUADRANT COMPARISON:  CT 05/04/2022 FINDINGS: Gallbladder: Cholelithiasis with largest stone measuring up to 13 mm. No wall thickening or pericholecystic fluid. The gallbladder is nondilated. Negative sonographic Murphy sign. Common bile duct: Diameter: 4.7 mm, normal.  No intrahepatic ductal dilation. Liver: No focal lesion identified. Within normal limits in parenchymal echogenicity. Portal vein is patent on color Doppler imaging with normal direction of blood flow towards the liver. Other: Pericardial effusion and right pleural effusion. IMPRESSION: Cholelithiasis with largest stone measuring up to 13 mm. No evidence of acute cholecystitis. Pericardial effusion and right pleural effusion noted. Electronically Signed   By: Maurine Simmering M.D.   On: 05/04/2022 16:24   CT Angio Chest/Abd/Pel for Dissection W and/or Wo Contrast  Result Date: 05/04/2022 CLINICAL DATA:  Upper abdominal pain radiating to the back since yesterday. Clinical concern for acute aortic syndrome. EXAM: CT ANGIOGRAPHY CHEST, ABDOMEN AND PELVIS TECHNIQUE: Non-contrast CT of the chest was initially obtained. Multidetector CT imaging through the chest, abdomen and pelvis was performed using the standard protocol during bolus administration of intravenous contrast. Multiplanar reconstructed images and MIPs were obtained and reviewed to evaluate the vascular anatomy. RADIATION DOSE REDUCTION: This exam was performed according to the departmental dose-optimization program which includes automated exposure control, adjustment of the mA and/or kV according to patient size and/or use of iterative reconstruction technique. CONTRAST:  145m OMNIPAQUE IOHEXOL 350 MG/ML SOLN COMPARISON:  Abdomen and pelvis CT dated 12/31/2021. Chest radiographs dated 02/28/2022. FINDINGS: CTA CHEST FINDINGS Cardiovascular: Enlarged heart. Moderate-sized pericardial effusion measuring up to 0.9 cm in thickness. Mildly enlarged central pulmonary  arteries with a main pulmonary artery diameter of 3.4 cm. Mitral valve annulus calcifications. Motion artifacts involving the heart and ascending thoracic aorta. No aneurysm or dissection seen. Mediastinum/Nodes: Normal appearing thyroid gland. Anterior mediastinal mass on the left containing coarse calcifications. This mass measures 4.3 x 4.1 cm on image number 46/307 and is separate from the thyroid gland and measures 3.9 cm in length on coronal image number 68/11. No enlarged lymph nodes elsewhere in the chest or axillary regions. Unremarkable esophagus. Lungs/Pleura: Small to moderate-sized left pleural effusion with associated compressive left lower lobe atelectasis. Mild dependent left upper lobe atelectasis. Minimal right pleural effusion and minimal right lower lobe atelectasis. Mildly prominent interstitial markings with bilateral centrilobular bullous changes. Musculoskeletal: Thoracic and lower cervical spine degenerative changes. Review of the MIP images confirms the above findings. CTA ABDOMEN AND PELVIS FINDINGS VASCULAR Aorta: Normal caliber aorta without aneurysm, dissection, vasculitis or significant stenosis. Celiac: Patent without evidence of aneurysm, dissection, vasculitis or significant stenosis. SMA: Patent without evidence of aneurysm, dissection, vasculitis or significant stenosis. Renals: 2 renal arteries on both sides. Both renal arteries on each side are patent without evidence of aneurysm, dissection, vasculitis, fibromuscular dysplasia or significant stenosis. IMA: Patent without evidence of aneurysm, dissection, vasculitis or significant stenosis. Inflow: Patent without evidence of aneurysm, dissection, vasculitis or significant stenosis. Veins: No obvious venous abnormality within the limitations of this arterial phase study. Review  of the MIP images confirms the above findings. NON-VASCULAR Hepatobiliary: 8 mm gallstone in the gallbladder neck without gallbladder wall thickening or  pericholecystic fluid. Unremarkable liver. Pancreas: Unremarkable. No pancreatic ductal dilatation or surrounding inflammatory changes. Spleen: Normal in size without focal abnormality. Adrenals/Urinary Tract: Mild bilateral adrenal hyperplasia. Small cysts in both kidneys without significant change. These are not need follow-up. Unremarkable urinary bladder and ureters. Stomach/Bowel: Large number of colonic diverticula without evidence of diverticulitis. Unremarkable stomach, small bowel and appendix. Lymphatic: Enlarged lymph nodes. Reproductive: Calcified uterine fibroids. The largest measures 4.9 cm. No adnexal masses. Other: No abdominal wall hernia or abnormality. No abdominopelvic ascites. Musculoskeletal: Lumbar spine degenerative changes. Review of the MIP images confirms the above findings. IMPRESSION: 1. No aortic aneurysm or dissection. 2. 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 This is separate from the thyroid gland. Differential considerations include germ cell neoplasm such as teratoma. This could be further evaluated with PET-CT or biopsy. 3. Cardiomegaly with a moderate-sized pericardial effusion. 4. Small to moderate-sized left pleural effusion with associated compressive left lower lobe atelectasis. 5. Minimal right pleural effusion and minimal right lower lobe atelectasis. 6. Centrilobular emphysema. 7. Cholelithiasis. 8. Colonic diverticulosis. Electronically Signed   By: Claudie Revering M.D.   On: 05/04/2022 14:55    Microbiology: Results for orders placed or performed during the hospital encounter of 04/03/22  Resp Panel by RT-PCR (Flu A&B, Covid) Anterior Nasal Swab     Status: None   Collection Time: 04/03/22  8:06 AM   Specimen: Anterior Nasal Swab  Result Value Ref Range Status   SARS Coronavirus 2 by RT PCR NEGATIVE NEGATIVE Final    Comment: (NOTE) SARS-CoV-2 target nucleic acids are NOT DETECTED.  The SARS-CoV-2 RNA is generally  detectable in upper respiratory specimens during the acute phase of infection. The lowest concentration of SARS-CoV-2 viral copies this assay can detect is 138 copies/mL. A negative result does not preclude SARS-Cov-2 infection and should not be used as the sole basis for treatment or other patient management decisions. A negative result may occur with  improper specimen collection/handling, submission of specimen other than nasopharyngeal swab, presence of viral mutation(s) within the areas targeted by this assay, and inadequate number of viral copies(<138 copies/mL). A negative result must be combined with clinical observations, patient history, and epidemiological information. The expected result is Negative.  Fact Sheet for Patients:  EntrepreneurPulse.com.au  Fact Sheet for Healthcare Providers:  IncredibleEmployment.be  This test is no t yet approved or cleared by the [place] FDA and  has been authorized for detection and/or diagnosis of SARS-CoV-2 by FDA under an Emergency Use Authorization (EUA). This EUA will remain  in effect (meaning this test can be used) for the duration of the COVID-19 declaration under Section 564(b)(1) of the Act, 21 U.S.C.section 360bbb-3(b)(1), unless the authorization is terminated  or revoked sooner.       Influenza A by PCR NEGATIVE NEGATIVE Final   Influenza B by PCR NEGATIVE NEGATIVE Final    Comment: (NOTE) The Xpert Xpress SARS-CoV-2/FLU/RSV plus assay is intended as an aid in the diagnosis of influenza from Nasopharyngeal swab specimens and should not be used as a sole basis for treatment. Nasal washings and aspirates are unacceptable for Xpert Xpress SARS-CoV-2/FLU/RSV testing.  Fact Sheet for Patients: EntrepreneurPulse.com.au  Fact Sheet for Healthcare Providers: IncredibleEmployment.be  This test is not yet approved or cleared by the [place] FDA  and has been authorized for detection and/or  diagnosis of SARS-CoV-2 by FDA under an Emergency Use Authorization (EUA). This EUA will remain in effect (meaning this test can be used) for the duration of the COVID-19 declaration under Section 564(b)(1) of the Act, 21 U.S.C. section 360bbb-3(b)(1), unless the authorization is terminated or revoked.  Performed at [facility], [address] [place], [place] [address]     Labs: CBC: Recent Labs  Lab 05/04/22 1225 05/05/22 0548 05/06/22 0415  WBC 6.7 5.8 6.2  NEUTROABS  --   --  3.7  HGB 10.1* 9.8* 9.8*  HCT 33.6* 32.3* 32.2*  MCV 92.3 91.2 91.7  PLT 302 312 761   Basic Metabolic Panel: Recent Labs  Lab 05/04/22 1225 05/05/22 0548  NA 141 141  K 3.8 3.8  CL 106 104  CO2 29 29  GLUCOSE 105* 95  BUN 21 21  CREATININE 0.96 1.01*  CALCIUM 9.3 9.2   Liver Function Tests: Recent Labs  Lab 05/04/22 1225  AST 19  ALT 16  ALKPHOS 73  BILITOT 0.6  PROT 7.8  ALBUMIN 3.5   CBG: No results for input(s): "GLUCAP" in the last 168 hours.  Discharge time spent: greater than 30 minutes.  Signed: Sharen Hones, MD [facility] 05/07/2022

## 2022-05-09 ENCOUNTER — Telehealth: Payer: Self-pay | Admitting: *Deleted

## 2022-05-09 NOTE — Patient Outreach (Signed)
  Care Coordination [facility] Note Transition Care Management Unsuccessful Follow-up Telephone Call  Date of discharge and from where:  [facility] [phone]  Attempts:  1st Attempt  Reason for unsuccessful TCM follow-up call:  Left voice message  [place] [facility] Management [phone]

## 2022-05-09 NOTE — Patient Outreach (Signed)
  Care Coordination [facility] Note Transition Care Management Follow-up Telephone Call Date of discharge and from where: [facility] [phone] How have you been since you were released from the hospital? ok Any questions or concerns? No  Items Reviewed: Did the pt receive and understand the discharge instructions provided? Yes  Reiterated taking all atbx until completed Medications obtained and verified? Yes  Other? No  Any new allergies since your discharge? No  Dietary orders reviewed? Yes low sodium heart healthy diet Do you have support at home? Yes   Home Care and Equipment/Supplies: Were home health services ordered? no If so, what is the name of the agency? N/a  Has the agency set up a time to come to the patient's home? not applicable Were any new equipment or medical supplies ordered?  No What is the name of the medical supply agency? N/a Were you able to get the supplies/equipment? not applicable Do you have any questions related to the use of the equipment or supplies? No  Functional Questionnaire: (I = Independent and D = Dependent) ADLs: d  Bathing/Dressing- D  Maintaining continence- I  Transferring/Ambulation- I  Managing Meds- D  Follow up appointments reviewed:  PCP Hospital f/u appt confirmed? Y Dr Army Melia 05/27/2022 10:40 [facility] f/u appt confirmed? Yes  Dr Rogue Bussing [phone] . Are transportation arrangements needed? No  If their condition worsens, is the pt aware to call PCP or go to the Emergency Dept.? Yes Was the patient provided with contact information for the PCP's office or ED? Yes Was to pt encouraged to call back with questions or concerns? Yes  SDOH assessments and interventions completed:   Yes  Care Coordination Interventions Activated:  Yes   Care Coordination Interventions:  No Care Coordination interventions needed at this time.   Encounter Outcome:  Pt. Visit Completed    [place]  Management [phone]

## 2022-05-10 ENCOUNTER — Ambulatory Visit: Payer: Self-pay | Admitting: *Deleted

## 2022-05-10 ENCOUNTER — Inpatient Hospital Stay
Admission: EM | Admit: 2022-05-10 | Discharge: 2022-05-24 | DRG: 189 | Disposition: A | Discharge status: Skilled Nursing Facility | Payer: Medicare Other | Attending: Internal Medicine | Admitting: Internal Medicine

## 2022-05-10 ENCOUNTER — Other Ambulatory Visit: Payer: Self-pay

## 2022-05-10 ENCOUNTER — Emergency Department: Payer: Medicare Other

## 2022-05-10 ENCOUNTER — Observation Stay (HOSPITAL_COMMUNITY)
Admit: 2022-05-10 | Discharge: 2022-05-10 | Disposition: A | Discharge status: Home / Self Care | Payer: Medicare Other | Attending: Internal Medicine | Admitting: Internal Medicine

## 2022-05-10 DIAGNOSIS — J9601 Acute respiratory failure with hypoxia: Principal | ICD-10-CM | POA: Diagnosis present

## 2022-05-10 DIAGNOSIS — R609 Edema, unspecified: Secondary | ICD-10-CM | POA: Diagnosis not present

## 2022-05-10 DIAGNOSIS — R0602 Shortness of breath: Secondary | ICD-10-CM | POA: Diagnosis not present

## 2022-05-10 DIAGNOSIS — Z79899 Other long term (current) drug therapy: Secondary | ICD-10-CM

## 2022-05-10 DIAGNOSIS — Z801 Family history of malignant neoplasm of trachea, bronchus and lung: Secondary | ICD-10-CM

## 2022-05-10 DIAGNOSIS — M79672 Pain in left foot: Secondary | ICD-10-CM | POA: Diagnosis present

## 2022-05-10 DIAGNOSIS — R262 Difficulty in walking, not elsewhere classified: Secondary | ICD-10-CM | POA: Diagnosis present

## 2022-05-10 DIAGNOSIS — K219 Gastro-esophageal reflux disease without esophagitis: Secondary | ICD-10-CM | POA: Diagnosis present

## 2022-05-10 DIAGNOSIS — M79671 Pain in right foot: Secondary | ICD-10-CM | POA: Diagnosis present

## 2022-05-10 DIAGNOSIS — Z743 Need for continuous supervision: Secondary | ICD-10-CM | POA: Diagnosis not present

## 2022-05-10 DIAGNOSIS — N179 Acute kidney failure, unspecified: Secondary | ICD-10-CM | POA: Diagnosis not present

## 2022-05-10 DIAGNOSIS — M7989 Other specified soft tissue disorders: Secondary | ICD-10-CM | POA: Diagnosis present

## 2022-05-10 DIAGNOSIS — I3139 Other pericardial effusion (noninflammatory): Secondary | ICD-10-CM | POA: Diagnosis present

## 2022-05-10 DIAGNOSIS — I10 Essential (primary) hypertension: Secondary | ICD-10-CM | POA: Diagnosis present

## 2022-05-10 DIAGNOSIS — R222 Localized swelling, mass and lump, trunk: Secondary | ICD-10-CM | POA: Diagnosis present

## 2022-05-10 DIAGNOSIS — F411 Generalized anxiety disorder: Secondary | ICD-10-CM | POA: Diagnosis present

## 2022-05-10 DIAGNOSIS — Z841 Family history of disorders of kidney and ureter: Secondary | ICD-10-CM

## 2022-05-10 DIAGNOSIS — Z6835 Body mass index (BMI) 35.0-35.9, adult: Secondary | ICD-10-CM

## 2022-05-10 DIAGNOSIS — J9811 Atelectasis: Secondary | ICD-10-CM | POA: Diagnosis not present

## 2022-05-10 DIAGNOSIS — Z1152 Encounter for screening for COVID-19: Secondary | ICD-10-CM

## 2022-05-10 DIAGNOSIS — E669 Obesity, unspecified: Secondary | ICD-10-CM | POA: Diagnosis present

## 2022-05-10 DIAGNOSIS — R053 Chronic cough: Secondary | ICD-10-CM | POA: Diagnosis present

## 2022-05-10 DIAGNOSIS — F32A Depression, unspecified: Secondary | ICD-10-CM | POA: Diagnosis present

## 2022-05-10 DIAGNOSIS — J90 Pleural effusion, not elsewhere classified: Secondary | ICD-10-CM | POA: Diagnosis not present

## 2022-05-10 DIAGNOSIS — J9859 Other diseases of mediastinum, not elsewhere classified: Secondary | ICD-10-CM | POA: Diagnosis present

## 2022-05-10 DIAGNOSIS — G4733 Obstructive sleep apnea (adult) (pediatric): Secondary | ICD-10-CM | POA: Diagnosis present

## 2022-05-10 DIAGNOSIS — R531 Weakness: Secondary | ICD-10-CM | POA: Diagnosis present

## 2022-05-10 DIAGNOSIS — Z8249 Family history of ischemic heart disease and other diseases of the circulatory system: Secondary | ICD-10-CM

## 2022-05-10 DIAGNOSIS — Z90710 Acquired absence of both cervix and uterus: Secondary | ICD-10-CM

## 2022-05-10 DIAGNOSIS — W19XXXA Unspecified fall, initial encounter: Secondary | ICD-10-CM | POA: Diagnosis not present

## 2022-05-10 LAB — BASIC METABOLIC PANEL
Anion gap: 9 (ref 5–15)
BUN: 23 mg/dL (ref 8–23)
CO2: 28 mmol/L (ref 22–32)
Calcium: 9.5 mg/dL (ref 8.9–10.3)
Chloride: 99 mmol/L (ref 98–111)
Creatinine, Ser: 1.01 mg/dL — ABNORMAL HIGH (ref 0.44–1.00)
GFR, Estimated: 58 mL/min — ABNORMAL LOW (ref >60)
Glucose, Bld: 124 mg/dL — ABNORMAL HIGH (ref 70–99)
Potassium: 4.1 mmol/L (ref 3.5–5.1)
Sodium: 136 mmol/L (ref 135–145)

## 2022-05-10 LAB — CBC
HCT: 35.2 % — ABNORMAL LOW (ref 36.0–46.0)
Hemoglobin: 10.7 g/dL — ABNORMAL LOW (ref 12.0–15.0)
MCH: 27.7 pg (ref 26.0–34.0)
MCHC: 30.4 g/dL (ref 30.0–36.0)
MCV: 91.2 fL (ref 80.0–100.0)
Platelets: 293 10*3/uL (ref 150–400)
RBC: 3.86 MIL/uL — ABNORMAL LOW (ref 3.87–5.11)
RDW: 15.5 % (ref 11.5–15.5)
WBC: 12.3 10*3/uL — ABNORMAL HIGH (ref 4.0–10.5)
nRBC: 0 % (ref 0.0–0.2)

## 2022-05-10 LAB — BRAIN NATRIURETIC PEPTIDE: B Natriuretic Peptide: 134.8 pg/mL — ABNORMAL HIGH (ref 0.0–100.0)

## 2022-05-10 LAB — TROPONIN I (HIGH SENSITIVITY): Troponin I (High Sensitivity): 15 ng/L (ref <18)

## 2022-05-10 MED ORDER — ACETAMINOPHEN 650 MG RE SUPP: 650.0000 mg | Freq: Four times a day (QID) | RECTAL | Status: DC | PRN

## 2022-05-10 MED ORDER — PANTOPRAZOLE SODIUM 40 MG PO TBEC
40.0000 mg | DELAYED_RELEASE_TABLET | Freq: Two times a day (BID) | ORAL | Status: DC
  Administered 2022-05-10 – 2022-05-24 (×28): 40 mg via ORAL
  Filled 2022-05-10 (×29): qty 1

## 2022-05-10 MED ORDER — HYDRALAZINE HCL 50 MG PO TABS
25.0000 mg | ORAL_TABLET | Freq: Three times a day (TID) | ORAL | Status: DC
  Administered 2022-05-10 – 2022-05-12 (×5): 25 mg via ORAL
  Filled 2022-05-10 (×6): qty 1

## 2022-05-10 MED ORDER — SODIUM CHLORIDE 0.9% FLUSH
3.0000 mL | Freq: Two times a day (BID) | INTRAVENOUS | Status: DC
  Administered 2022-05-10 – 2022-05-13 (×7): 3 mL via INTRAVENOUS

## 2022-05-10 MED ORDER — ACETAMINOPHEN 325 MG PO TABS
650.0000 mg | ORAL_TABLET | Freq: Four times a day (QID) | ORAL | Status: DC | PRN
  Administered 2022-05-12 – 2022-05-22 (×6): 650 mg via ORAL
  Filled 2022-05-10 (×7): qty 2

## 2022-05-10 MED ORDER — FUROSEMIDE 10 MG/ML IJ SOLN
40.0000 mg | Freq: Once | INTRAMUSCULAR | Status: AC
  Administered 2022-05-10: 40 mg via INTRAVENOUS
  Filled 2022-05-10: qty 4

## 2022-05-10 MED ORDER — OLANZAPINE 2.5 MG PO TABS
2.5000 mg | ORAL_TABLET | Freq: Every day | ORAL | Status: DC
  Administered 2022-05-10 – 2022-05-23 (×13): 2.5 mg via ORAL
  Filled 2022-05-10 (×17): qty 1

## 2022-05-10 MED ORDER — FUROSEMIDE 10 MG/ML IJ SOLN: 40.0000 mg | Freq: Every day | INTRAMUSCULAR | Status: DC

## 2022-05-10 MED ORDER — POLYETHYLENE GLYCOL 3350 17 G PO PACK
17.0000 g | PACK | Freq: Every day | ORAL | Status: DC | PRN
  Administered 2022-05-15 – 2022-05-16 (×2): 17 g via ORAL
  Filled 2022-05-10 (×3): qty 1

## 2022-05-10 MED ORDER — ADULT MULTIVITAMIN W/MINERALS CH
1.0000 | ORAL_TABLET | Freq: Every day | ORAL | Status: DC
  Administered 2022-05-10 – 2022-05-24 (×14): 1 via ORAL
  Filled 2022-05-10 (×14): qty 1

## 2022-05-10 MED ORDER — ENOXAPARIN SODIUM 60 MG/0.6ML IJ SOSY
0.5000 mg/kg | PREFILLED_SYRINGE | INTRAMUSCULAR | Status: DC
  Administered 2022-05-10 – 2022-05-12 (×3): 50 mg via SUBCUTANEOUS
  Filled 2022-05-10 (×3): qty 0.6

## 2022-05-10 NOTE — Assessment & Plan Note (Signed)
Patient complaining of lower extremity weakness due to edema, however on my examination she has global weakness in all 4 extremities.  I suspect this is due to deconditioning given recent hospitalization and sedentary lifestyle since being home.  Low suspicion for given lack of focal findings, low suspicion for CVA.  -PT/OT

## 2022-05-10 NOTE — Assessment & Plan Note (Addendum)
-   hold oral hydralazine due to low normal BP

## 2022-05-10 NOTE — Assessment & Plan Note (Addendum)
-   Oncology consulted on previous admission with outpatient follow-up established - Pathology obtained

## 2022-05-10 NOTE — ED Provider Notes (Signed)
[facility] Provider Note    Event Date/Time   First MD Initiated Contact with Patient 05/10/22 1258     (approximate)   History   Leg Swelling   HPI  Taylor Johnson is a 76 y.o. female with a history of developmental delay, pleural effusion, pericardial effusion, mediastinal mass, recently biopsied who presents with complaints of shortness of breath, lower extremity swelling.  Patient found to be hypoxic in triage, started on 2 L nasal cannula with improvement.  Patient is unable to give significant history.  Review of record demonstrates the patient had recent CT-guided biopsy of mediastinal mass.  She has known history of pleural effusion.  CT also demonstrated pericardial effusion     Physical Exam   Triage Vital Signs: ED Triage Vitals  Enc Vitals Group     BP 05/10/22 1225 (!) 146/64     Pulse Rate 05/10/22 1225 (!) 102     Resp 05/10/22 1225 20     Temp 05/10/22 1225 98 F (36.7 C)     Temp src --      SpO2 05/10/22 1225 94 %     Weight --      Height --      Head Circumference --      Peak Flow --      Pain Score 05/10/22 1223 6     Pain Loc --      Pain Edu? --      Excl. in [place]? --     Most recent vital signs: Vitals:   05/10/22 1225 05/10/22 1313  BP: (!) 146/64   Pulse: (!) 102   Resp: 20   Temp: 98 F (36.7 C)   SpO2: 94% 97%     General: Awake, no distress.  CV:  Good peripheral perfusion.  Resp:  Mild tachypnea no Rales Abd:  No distention.  Other:  1+ lower extremity swelling bilaterally   ED Results / Procedures / Treatments   Labs (all labs ordered are listed, but only abnormal results are displayed) Labs Reviewed  BASIC METABOLIC PANEL - Abnormal; Notable for the following components:      Result Value   Glucose, Bld 124 (*)    Creatinine, Ser 1.01 (*)    GFR, Estimated 58 (*)    All other components within normal limits  CBC - Abnormal; Notable for the following components:   WBC 12.3 (*)    RBC 3.86  (*)    Hemoglobin 10.7 (*)    HCT 35.2 (*)    All other components within normal limits  BRAIN NATRIURETIC PEPTIDE - Abnormal; Notable for the following components:   B Natriuretic Peptide 134.8 (*)    All other components within normal limits  TROPONIN I (HIGH SENSITIVITY)     EKG  ED ECG REPORT I, Lavonia Drafts, the attending physician, personally viewed and interpreted this ECG.  Date: 05/10/2022  Rhythm: Sinus tachycardia QRS Axis: normal Intervals: normal ST/T Wave abnormalities: normal Narrative Interpretation: no evidence of acute ischemia    RADIOLOGY Chest x-ray viewed interpret by me, pleural effusion, no significant change    PROCEDURES:  Critical Care performed: yes  CRITICAL CARE Performed by: Lavonia Drafts   Total critical care time: 30 minutes  Critical care time was exclusive of separately billable procedures and treating other patients.  Critical care was necessary to treat or prevent imminent or life-threatening deterioration.  Critical care was time spent personally by me on the following activities:  development of treatment plan with patient and/or surrogate as well as nursing, discussions with consultants, evaluation of patient's response to treatment, examination of patient, obtaining history from patient or surrogate, ordering and performing treatments and interventions, ordering and review of laboratory studies, ordering and review of radiographic studies, pulse oximetry and re-evaluation of patient's condition.   Procedures   MEDICATIONS ORDERED IN ED: Medications  furosemide (LASIX) injection 40 mg (40 mg Intravenous Given 05/10/22 1428)     IMPRESSION / MDM / ASSESSMENT AND PLAN / ED COURSE  I reviewed the triage vital signs and the nursing notes. Patient's presentation is most consistent with acute presentation with potential threat to life or bodily function.   Patient presents with shortness of breath and lower extremity  swelling.  History is severely limited.  Medical records demonstrate recent mediastinal mass has been biopsied.  Shortness of breath could be caused by CHF, pneumothorax, pericardial effusion, pneumonia  Chest x-ray does not appear significantly changed from prior she still has pleural effusion  We will give a dose of IV Lasix her BNP is mildly elevated.  Given that she has a new oxygen requirement will admit to the hospitalist service for further work-up and treatment       FINAL CLINICAL IMPRESSION(S) / ED DIAGNOSES   Final diagnoses:  Shortness of breath  Leg swelling  Pericardial effusion  Pleural effusion  Acute respiratory failure with hypoxia ([place])     Rx / DC Orders   ED Discharge Orders     None        Note:  This document was prepared using Dragon voice recognition software and may include unintentional dictation errors.   Lavonia Drafts, MD 05/10/22 [phone]

## 2022-05-10 NOTE — Progress Notes (Signed)
Patient arrived to unit in hospital bed in stable condition. Sister at bedside.

## 2022-05-10 NOTE — Assessment & Plan Note (Signed)
Patient presented with chest pain on 10/18 and found to have a mediastinal mass with pericardial effusion.  --current Echo showed the same small to mod pericardial effusion

## 2022-05-10 NOTE — H&P (Signed)
History and Physical    PatientMarland Kitchen Leyla Soliz Johnson ERX:[id number] DOB: 12/23/1945 DOA: 05/10/2022 DOS: the patient was seen and examined on 05/10/2022 PCP: [name], MD  Patient coming from: Home  Chief Complaint:  Chief Complaint  Patient presents with   Leg Swelling   HPI: Taylor Johnson is a 76 y.o. female with medical history significant of recently discovered mediastinal mass, hypertension, OSA on CPAP, developmental delay who presents to the ED with complaints of leg swelling.  Patient recently admitted from 10/18-10/21 after presenting with chest pain and discovered to have a mediastinal mass.  She underwent IR biopsy with results pending.  Oncology was consulted.  Additional findings include pericardial effusion with no evidence of tamponade at that time in addition to pleural effusion unable to undergo thoracentesis.  Prior to discharge, she spiked a fever and was started on Augmentin for aspiration coverage.  [name] states that since being home for the last 3 days, she has noticed rapidly progressive lower extremity edema that makes her legs feel heavy and weak.  Due to this, she has been struggling to walk around her home.  Her sister went to pick her up today for a dental appointment, she was unable to walk to be able to get out of the house.  [name] also endorses shortness of breath over the last 3 days.  She denies any orthopnea.  She states that SOB is accompanied by cough, but however the cough is chronic and unchanged in nature.  She denies any fevers, chills, nausea, vomiting, diarrhea, abdominal pain.  ED course: On EMS arrival, patient was noted to be saturating at 88-89% on room air.  Due to this, she was placed on 2 L nasal cannula.  On ED arrival, patient was saturating at 94% on 2 L.  She was hypertensive at 146/64 with heart rate of 102.  She was afebrile 98.4.  Initial work-up remarkable for BNP at 134, white blood count of 12.3, hemoglobin of 10.7.  Chest  x-ray was obtained that showed stable left pleural effusion.  Due to new oxygen requirement and inability to ambulate, [facility] contacted for admission.  Review of Systems: As mentioned in the history of present illness. All other systems reviewed and are negative.  Past Medical History:  Diagnosis Date   Abdominal pain 01/27/2017   Anxiety    Anxiety, generalized 05/11/2015   Cough 06/02/2016   Chronic - followed by Pulmonary   Cough 06/02/2016   Chronic - followed by Pulmonary   Depression    Developmental delay    Diverticulitis    Diverticulosis of colon 05/11/2015   Dysphagia 10/15/2015   Routine Ba Swallow normal; rec modified barium swallow    GERD (gastroesophageal reflux disease)    Hematochezia 11/07/2019   Hypertension    Hypoxia 09/12/2015   Leg swelling    Localized edema 04/15/2016   Microscopic hematuria 04/15/2016   Nausea and vomiting 01/29/2017   Orthostatic hypotension 03/16/2017   OSA on CPAP 11/18/2015   CPAP @ 18 cm H2O started 02/2016   Rectal bleeding 10/15/2018   Urge incontinence of urine 05/12/2015   Past Surgical History:  Procedure Laterality Date   ABDOMINAL HYSTERECTOMY     BREAST BIOPSY Left    neg   COLON SURGERY     COLONOSCOPY WITH PROPOFOL N/A 12/13/2019   Procedure: COLONOSCOPY WITH PROPOFOL;  Surgeon: Lin Landsman, MD;  Location: [facility];  Service: Endoscopy;  Laterality: N/A;   ESOPHAGOGASTRODUODENOSCOPY (EGD)  WITH PROPOFOL N/A 01/29/2017   Procedure: ESOPHAGOGASTRODUODENOSCOPY (EGD) WITH PROPOFOL;  Surgeon: Wilford Corner, MD;  Location: [facility] ENDOSCOPY;  Service: Endoscopy;  Laterality: N/A;   ESOPHAGOGASTRODUODENOSCOPY (EGD) WITH PROPOFOL N/A 08/02/2021   Procedure: ESOPHAGOGASTRODUODENOSCOPY (EGD) WITH PROPOFOL;  Surgeon: Lin Landsman, MD;  Location: [facility] ENDOSCOPY;  Service: Gastroenterology;  Laterality: N/A;   Social History:  reports that she has never smoked. She has never used smokeless tobacco. She reports  that she does not drink alcohol and does not use drugs.  No Known Allergies  Family History  Problem Relation Age of Onset   Hypertension Mother    Kidney disease Mother    Dementia Mother    Cancer Father        lung   Bladder Cancer Neg Hx    Breast cancer Neg Hx     Prior to Admission medications   Medication Sig Start Date End Date Taking? Authorizing Provider  amoxicillin-clavulanate (AUGMENTIN) 875-125 MG tablet Take 1 tablet by mouth every 12 (twelve) hours for 4 days. 05/07/22 05/11/22 Yes Sharen Hones, MD  hydrALAZINE (APRESOLINE) 25 MG tablet NEW PRESCRIPTION REQUEST:: TAKE ONE TABLET BY MOUTH THREE TIMES DAILY 01/26/22  Yes [name], MD  meclizine (ANTIVERT) 12.5 MG tablet TAKE 1 TABLET BY MOUTH 3 TIMES DAILY AS NEEDED FOR DIZZINESS 03/22/22  Yes [name], MD  OLANZapine (ZYPREXA) 2.5 MG tablet Take 1 tablet (2.5 mg total) by mouth at bedtime. 04/19/22  Yes [name], MD  pantoprazole (PROTONIX) 40 MG tablet Take 1 tablet (40 mg total) by mouth 2 (two) times daily. 12/23/21 05/10/22 Yes Nolberto Hanlon, MD  metoCLOPramide (REGLAN) 5 MG tablet TAKE ONE TABLET BY MOUTH THREE TIMES A DAY 04/22/22   [name], MD   Physical Exam: Vitals:   05/10/22 1225 05/10/22 1313 05/10/22 1549 05/10/22 1711  BP: (!) 146/64  132/60 (!) 137/51  Pulse: (!) 102  94 92  Resp: 20  (!) 22 20  Temp: 98 F (36.7 C)  98.4 F (36.9 C) 98.5 F (36.9 C)  TempSrc:   Oral Oral  SpO2: 94% 97% 96% 99%  Weight:    97 kg  Height:    '5\' 4"'$  (1.626 m)   Physical Exam Vitals and nursing note reviewed.  Constitutional:      General: She is not in acute distress.    Appearance: She is normal weight. She is not toxic-appearing.  HENT:     Head: Normocephalic and atraumatic.     Mouth/Throat:     Mouth: Mucous membranes are moist.     Pharynx: Oropharynx is clear.  Eyes:     Conjunctiva/sclera: Conjunctivae normal.     Pupils: Pupils are equal, round, and reactive to light.   Cardiovascular:     Rate and Rhythm: Normal rate and regular rhythm.     Heart sounds: No murmur heard. Pulmonary:     Effort: Pulmonary effort is normal.     Breath sounds: Examination of the left-middle field reveals rales. Examination of the left-lower field reveals rales. Rales present. No decreased breath sounds, wheezing or rhonchi.  Musculoskeletal:     Cervical back: Neck supple.     Right lower leg: 1+ Pitting Edema (up to mid shin) present.     Left lower leg: 1+ Pitting Edema (up to mid shin) present.  Skin:    General: Skin is warm and dry.  Neurological:     Mental Status: She is alert.  Cranial Nerves: No dysarthria or facial asymmetry.     Sensory: Sensation is intact.     Comments:  Alert and oriented x3. Slow to respond to question but at baseline mentation per sister at bedside  3/5 strength in all 4 extremities    Psychiatric:        Attention and Perception: Attention normal.        Mood and Affect: Mood normal.        Speech: Speech is delayed.        Behavior: Behavior is slowed.    Data Reviewed: CBC notable for WBC elevated 12.3, hemoglobin of 10.7. Troponin within normal limits at 15. BNP elevated 134.   Chest xray obtained with results remarkable for stable enlargement of the cardio-pericardial silhouette, in addition to small left pleural effusion with associated atelectasis.   EKG with sinus rhythm with rate of 104. Intermittent PACs. No ST or T wave inversions concerning for acute ischemia.   Results are pending, will review when available.  Assessment and Plan: * Acute respiratory failure with hypoxia [facility]) Patient presenting with 3-day history of worsening shortness of breath and lower extremity edema.  BNP is elevated but slightly decreased from several days ago.  On examination, she has minimal Rales limited to the left side where known pleural effusion is present in addition to 1+ pitting edema bilaterally.  Differential for hypoxia  includes worsening pericardial effusion versus HFpEF exacerbation.  Patient was previously on Augmentin for coverage of aspiration pneumonitis however chest x-ray today with no evidence of opacities.  No indication for continued antimicrobials at this time.  There may be an aspect of deconditioning with poor respiratory effort.  Will order an incentive spirometer.  - Continue supplemental oxygen to maintain O2 saturation above 90% - Wean as tolerated - Ambulatory oxygen saturation measurement - TTE pending for evaluation of pericardial effusion - S/p Lasix 40 mg IV - Daily weights - Strict in and outs  Pericardial effusion Patient presented with chest pain on 10/18 and found to have a mediastinal mass with pericardial effusion.  Given shortness of breath with hypoxia today, will obtain repeat TTE to ensure effusion has not enlarged.   -TTE pending  Generalized weakness Patient complaining of lower extremity weakness due to edema, however on my examination she has global weakness in all 4 extremities.  I suspect this is due to deconditioning given recent hospitalization and sedentary lifestyle since being home.  Low suspicion for given lack of focal findings, low suspicion for CVA.  -PT/OT  Mediastinal mass - Oncology consulted on previous admission with outpatient follow-up established - Pathology still pending  OSA on CPAP - CPAP at nighttime  Benign essential HTN - Continue home antihypertensives   Advance Care Planning:   Code Status: Full Code   Consults: None  Family Communication: Patient's sister updated at bedside.   Severity of Illness: The appropriate patient status for this patient is OBSERVATION. Observation status is judged to be reasonable and necessary in order to provide the required intensity of service to ensure the patient's safety. The patient's presenting symptoms, physical exam findings, and initial radiographic and laboratory data in the context of their  medical condition is felt to place them at decreased risk for further clinical deterioration. Furthermore, it is anticipated that the patient will be medically stable for discharge from the hospital within 2 midnights of admission.   Author: Jose Persia, MD 05/10/2022 6:19 PM  For on call review [email or web address].

## 2022-05-10 NOTE — Progress Notes (Signed)
PHARMACIST - PHYSICIAN COMMUNICATION  CONCERNING:  Enoxaparin (Lovenox) for DVT Prophylaxis    RECOMMENDATION: Patient was prescribed enoxaprin '40mg'$  q24 hours for VTE prophylaxis.   There were no vitals filed for this visit.  There is no height or weight on file to calculate BMI.  Estimated Creatinine Clearance: 55.2 mL/min (A) (by C-G formula based on SCr of 1.01 mg/dL (H)).  Based on [place] patient is candidate for enoxaparin 0.'5mg'$ /kg TBW SQ every 24 hours based on BMI being >30.  DESCRIPTION: Pharmacy has adjusted enoxaparin dose per [facility] policy.  Patient is now receiving enoxaparin 50 mg every 24 hours    Benita Gutter 05/10/2022 4:15 PM

## 2022-05-10 NOTE — Assessment & Plan Note (Addendum)
Patient presenting with 3-day history of worsening shortness of breath and lower extremity edema.  BNP only mildly elevated at 134.  CXR no acute finding.  Pt was found to desat to 87% on room air while at rest, unclear etiology.  No hx of asthma or COPD. --There may be an aspect of deconditioning with poor respiratory effort.   --repeat Echo during this hospitalization with again normal LVEF, and showed again small to mod pericardial effusion. --Cr and BUN both increased with just IV lasix 40 mg x1 on admission, but subsequently tolerated it with stable kidney function --weaned down to room air on 10/30 Plan: --cont IV lasix daily --cont incentive spirometer.

## 2022-05-10 NOTE — ED Triage Notes (Signed)
Pt comes via EMS from home with fluid on lungs, bilateral leg swelling. Pt on 2L at 99%. Pt was 89% RA.  VSS  Lungs clear per EMS.  Pt states sob. Pt is slow to respond but at baseline per sister.

## 2022-05-10 NOTE — Telephone Encounter (Signed)
Chief Complaint: a lot of swelling in legs Symptoms: denies SOB, not been up Frequency: started after discharge from hosp Pertinent Negatives: Patient denies sob Disposition: '[x]'$ ED /'[]'$ Urgent Care (no appt availability in office) / '[]'$ Appointment(In office/virtual)/ '[]'$  [facility]/ '[]'$ Home Care/ '[]'$ Refused Recommended Disposition /'[]'$ [facility]/ '[]'$  Follow-up with PCP Additional Notes: family can not get pt up and in the car. Pt has pitting edema at least up to her knees, unclear if further, states she is not SOB but has not been up. Was given lasix IV in the hospital but not on it at home. Hospitalized with pleural effusion and pericardial effusion. Pt's sister is going to call 911 and have them evaluate her and take her to ED if she needs to go since she can not get her in the car.   Reason for Disposition  Patient sounds very sick or weak to the triager  Answer Assessment - Initial Assessment Questions 1. ONSET: "When did the swelling start?" (e.g., minutes, hours, days)     Dc from hosp 4 days ago 2. LOCATION: "What part of the leg is swollen?"  "Are both legs swollen or just one leg?"     Both legs 3. SEVERITY: "How bad is the swelling?" (e.g., localized; mild, moderate, severe)   - Localized: Small area of swelling localized to one leg.   - MILD pedal edema: Swelling limited to foot and ankle, pitting edema < 1/4 inch (6 mm) deep, rest and elevation eliminate most or all swelling.   - MODERATE edema: Swelling of lower leg to knee, pitting edema > 1/4 inch (6 mm) deep, rest and elevation only partially reduce swelling.   - SEVERE edema: Swelling extends above knee, facial or hand swelling present.      Very swollen 4. REDNESS: "Does the swelling look red or infected?"     no 5. PAIN: "Is the swelling painful to touch?" If Yes, ask: "How painful is it?"   (Scale 1-10; mild, moderate or severe)     no 6. FEVER: "Do you have a fever?" If Yes, ask: "What is it, how was  it measured, and when did it start?"      maybe 7. CAUSE: "What do you think is causing the leg swelling?"     unsure 8. MEDICAL HISTORY: "Do you have a history of blood clots (e.g., DVT), cancer, heart failure, kidney disease, or liver failure?"     no 9. RECURRENT SYMPTOM: "Have you had leg swelling before?" If Yes, ask: "When was the last time?" "What happened that time?"     no 10. OTHER SYMPTOMS: "Do you have any other symptoms?" (e.g., chest pain, difficulty breathing)       no 11. PREGNANCY: "Is there any chance you are pregnant?" "When was your last menstrual period?"       no  Answer Assessment - Initial Assessment Questions 1. MAIN CONCERN OR SYMPTOM:  "What is your main concern right now?" "What questions do you have?" "What's the main symptom you're worried about?" (e.g., breathing difficulty, ankle swelling, weight gain.)     Legs swelling up to knees not sob 2. ONSET: "When did the  swelling  start?"     After discharge 3. BREATHING DIFFICULTY: "Are you having any difficulty breathing?" If Yes, ask: "How bad is it?"  (e.g., none, mild, moderate, severe)   - MILD: No SOB at rest, mild SOB with walking, speaks normally in sentences, able to lie down, no retractions, pulse <  100.   - MODERATE: SOB at rest, SOB with minimal exertion and prefers to sit, cannot lie down flat, speaks in phrases, mild retractions, audible wheezing, pulse 100-120.   - SEVERE: Very SOB at rest, speaks in single words, struggling to breathe, sitting hunched forward, retractions, pulse > 120      No, has not been out of bed 4. BETTER-SAME-WORSE: "Are you getting better, staying the same, or getting worse compared to the day you were discharged?"     Legs getting larger 5. HOSPITALIZATION: "How long were you hospitalized?" (e.g., days)     4 days 6. DISCHARGE DATE: "What date were you discharged from the hospital?"      7. DISCHARGE DOCTOR: "Who is the main doctor taking care of you now?"     Halina Maidens 8. DISCHARGE APPOINTMENT: "Have you scheduled a follow-up discharge appointment with your doctor?"     yes 9. DISCHARGE MEDICINES: "Did the physician who discharged you order any new medicines for you to use?" If Yes, ask: "Have you filled the prescription and started taking the medicine?"      yes 10. WEIGHT - DISCHARGE:  "Do you know your weight when you were discharged from the hospital?"        Unknown, can not get up 11. WEIGHT - TARGET:  "Do you have a target weight?"        no 12. WEIGHT - CURRENT:  "What is your current weight?"        no 13. OTHER SYMPTOMS: "Do you have any other symptoms?" (e.g., depression, weakness)       no 14. O2 SATURATION MONITOR:  "Do you use an oxygen saturation monitor (pulse oximeter) at home?" If Yes, "What is your reading (oxygen level) today?" "What is your usual oxygen saturation reading?" (e.g., 95%)       no  Protocols used: Leg Swelling and Edema-A-AH, Heart Failure Post-Hospitalization Follow-up Call-A-AH

## 2022-05-10 NOTE — Assessment & Plan Note (Signed)
-   CPAP at nighttime

## 2022-05-10 NOTE — Telephone Encounter (Signed)
Noted   Pt going to ED.  KP

## 2022-05-11 ENCOUNTER — Observation Stay: Payer: Medicare Other

## 2022-05-11 DIAGNOSIS — M79604 Pain in right leg: Secondary | ICD-10-CM | POA: Diagnosis not present

## 2022-05-11 DIAGNOSIS — M79605 Pain in left leg: Secondary | ICD-10-CM | POA: Diagnosis not present

## 2022-05-11 DIAGNOSIS — J9601 Acute respiratory failure with hypoxia: Secondary | ICD-10-CM | POA: Diagnosis not present

## 2022-05-11 LAB — ECHOCARDIOGRAM LIMITED
Height: 64 in
S' Lateral: 2.4 cm
Weight: 3421.54 oz

## 2022-05-11 LAB — CBC
HCT: 29.9 % — ABNORMAL LOW (ref 36.0–46.0)
Hemoglobin: 9.2 g/dL — ABNORMAL LOW (ref 12.0–15.0)
MCH: 27.7 pg (ref 26.0–34.0)
MCHC: 30.8 g/dL (ref 30.0–36.0)
MCV: 90.1 fL (ref 80.0–100.0)
Platelets: 273 10*3/uL (ref 150–400)
RBC: 3.32 MIL/uL — ABNORMAL LOW (ref 3.87–5.11)
RDW: 15.6 % — ABNORMAL HIGH (ref 11.5–15.5)
WBC: 9.6 10*3/uL (ref 4.0–10.5)
nRBC: 0 % (ref 0.0–0.2)

## 2022-05-11 LAB — BASIC METABOLIC PANEL
Anion gap: 8 (ref 5–15)
BUN: 30 mg/dL — ABNORMAL HIGH (ref 8–23)
CO2: 30 mmol/L (ref 22–32)
Calcium: 9.2 mg/dL (ref 8.9–10.3)
Chloride: 102 mmol/L (ref 98–111)
Creatinine, Ser: 1.35 mg/dL — ABNORMAL HIGH (ref 0.44–1.00)
GFR, Estimated: 41 mL/min — ABNORMAL LOW (ref >60)
Glucose, Bld: 130 mg/dL — ABNORMAL HIGH (ref 70–99)
Potassium: 4.4 mmol/L (ref 3.5–5.1)
Sodium: 140 mmol/L (ref 135–145)

## 2022-05-11 LAB — SURGICAL PATHOLOGY

## 2022-05-11 NOTE — TOC CM/SW Note (Signed)
  Transition of Care (TOC) Screening Note   Patient Details  Name: Taylor Johnson Date of Birth: [birth year]   Transition of Care [facility]) CM/SW Contact:    Colen Darling, [place] Phone Number: 05/11/2022, 4:55 PM    Transition of Care Department [facility]) has reviewed patient and no TOC needs have been identified at this time. We will continue to monitor patient advancement through interdisciplinary progression rounds. If new patient transition needs arise, please place a TOC consult.

## 2022-05-11 NOTE — Evaluation (Addendum)
Physical Therapy Evaluation Patient Details Name: Taylor Johnson MRN: [id number] DOB: 1945/07/31 Today's Date: 05/11/2022  History of Present Illness  76 yo female pt presents with bilateral LE swelling/ weakness and SOB  s/p acute respiratory failure and hypoxia. PMH includes developmental delay, plural effusion, pericadial effusion, SOB, and Mediastinal Mass bipopsy (pending results)  Clinical Impression  Pt presented lethargic and sleepy. She was oriented to person and place. Bilateral LE swelling and warmth to touch noted (MD and nurse notified.). Pt responded with a grimacing face when left calf was touched, but it diminished throughout session. Supine<> sit with mod - max assist and multiple verbal and tactile cues. Initially pt required maxA for sitting balance, did progress to occasionally maintaining with CGA-minA. Pt would benefit from skilled PT intervention to maximize function, safety, balance, and mobility to return to PLOF. Current recommendation is SNF.   Pt placed on room air at start of session per MD request, desaturated to 86% on room air at rest.    Recommendations for follow up therapy are one component of a multi-disciplinary discharge planning process, led by the attending physician.  Recommendations may be updated based on patient status, additional functional criteria and insurance authorization.  Follow Up Recommendations Skilled nursing-short term rehab (<3 hours/day) Can patient physically be transported by private vehicle: No    Assistance Recommended at Discharge Frequent or constant Supervision/Assistance  Patient can return home with the following  Direct supervision/assist for medications management;Direct supervision/assist for financial management;Two people to help with bathing/dressing/bathroom;Two people to help with walking and/or transfers;Assistance with cooking/housework;Help with stairs or ramp for entrance;Assist for transportation    Equipment  Recommendations Other (comment) (TBD)  Recommendations for Other Services       Functional Status Assessment Patient has had a recent decline in their functional status and demonstrates the ability to make significant improvements in function in a reasonable and predictable amount of time.     Precautions / Restrictions Precautions Precautions: Fall Restrictions Weight Bearing Restrictions: No      Mobility  Bed Mobility Overal bed mobility: Needs Assistance Bed Mobility: Supine to Sit     Supine to sit: Mod assist Sit to supine: +2 for safety/equipment, Max assist        Transfers Overall transfer level: Needs assistance Equipment used: Rolling walker (2 wheels) Transfers: Sit to/from Stand Sit to Stand: Max assist, +2 physical assistance                Ambulation/Gait                  Stairs            Wheelchair Mobility    Modified Rankin (Stroke Patients Only)       Balance Overall balance assessment: Needs assistance Sitting-balance support: Feet supported Sitting balance-Leahy Scale: Poor Sitting balance - Comments: Pnt required max assist at times to maintain seated upright posture at EOB   Standing balance support: Bilateral upper extremity supported, Reliant on assistive device for balance Standing balance-Leahy Scale: Zero                               Pertinent Vitals/Pain Pain Assessment Pain Assessment: Faces Faces Pain Scale: Hurts even more Pain Location: left calf Pain Intervention(s): Monitored during session, Limited activity within patient's tolerance    Home Living Family/patient expects to be discharged to:: Private residence Living Arrangements: Other relatives (Niece) Available Help at  Discharge: Family;Available PRN/intermittently (Family reports they will not be able to physically assist patient at discharge.) Type of Home: House Home Access: Stairs to enter;Ramped entrance Entrance  Stairs-Rails: Right;Left Entrance Stairs-Number of Steps: 3   Home Layout: One level Home Equipment: Conservation officer, nature (2 wheels);Cane - single point;Grab bars - tub/shower;Toilet riser Additional Comments: Pt poor historian, unable to assist in PLOF    Prior Function Prior Level of Function : Independent/Modified Independent             Mobility Comments: Pt uses SPC in community but nothing inside of home for functional mobility. ADLs Comments: Family reposrts she is able to complete ADLs but does not drive prior to last hospital admission     Hand Dominance        Extremity/Trunk Assessment   Upper Extremity Assessment Upper Extremity Assessment: Generalized weakness    Lower Extremity Assessment Lower Extremity Assessment: Generalized weakness    Cervical / Trunk Assessment Cervical / Trunk Assessment: Normal  Communication   Communication: Expressive difficulties;Receptive difficulties  Cognition Arousal/Alertness: Lethargic Behavior During Therapy: Flat affect Overall Cognitive Status: History of cognitive impairments - at baseline                                 General Comments: pt was able to follow simple verbal cues when repeated multiple times and with family encouragement        General Comments      Exercises General Exercises - Lower Extremity Ankle Circles/Pumps: AROM, Right, Left, 10 reps Heel Slides: AROM, Right, Left, 10 reps   Assessment/Plan    PT Assessment Patient needs continued PT services  PT Problem List Decreased strength;Decreased cognition;Decreased range of motion;Decreased knowledge of use of DME;Decreased activity tolerance;Decreased safety awareness;Decreased balance;Decreased mobility       PT Treatment Interventions DME instruction;Balance training;Modalities;Gait training;Neuromuscular re-education;Stair training;Functional mobility training;Patient/family education;Therapeutic activities;Therapeutic exercise     PT Goals (Current goals can be found in the Care Plan section)  Acute Rehab PT Goals Patient Stated Goal: to return to PLOF PT Goal Formulation: With patient/family Time For Goal Achievement: 05/25/22 Potential to Achieve Goals: Fair    Frequency Min 2X/week     Co-evaluation               AM-PAC PT "6 Clicks" Mobility  Outcome Measure Help needed turning from your back to your side while in a flat bed without using bedrails?: A Lot Help needed moving from lying on your back to sitting on the side of a flat bed without using bedrails?: A Lot Help needed moving to and from a bed to a chair (including a wheelchair)?: A Lot Help needed standing up from a chair using your arms (e.g., wheelchair or bedside chair)?: Total Help needed to walk in hospital room?: Total Help needed climbing 3-5 steps with a railing? : Total 6 Click Score: 9    End of Session Equipment Utilized During Treatment: Gait belt Activity Tolerance: Patient limited by fatigue;Patient limited by lethargy Patient left: in bed;with family/visitor present;with call bell/phone within reach;with bed alarm set Nurse Communication: Mobility status PT Visit Diagnosis: Other abnormalities of gait and mobility (R26.89);Unsteadiness on feet (R26.81);Muscle weakness (generalized) (M62.81);Difficulty in walking, not elsewhere classified (R26.2)    Time: 8546-2703 PT Time Calculation (min) (ACUTE ONLY): 32 min   Charges:   PT Evaluation $PT Eval Low Complexity: 1 Low PT Treatments $Therapeutic Exercise: 8-22 mins $Therapeutic Activity:  8-22 mins       [employer], SPT 05/11/2022, 1:35 PM

## 2022-05-11 NOTE — Evaluation (Signed)
Occupational Therapy Evaluation Patient Details Name: Taylor Johnson MRN: [id number] DOB: 11-19-1945 Today's Date: 05/11/2022   History of Present Illness 76 yo female pt presents with bilateral LE swelling/ weakness and SOB  s/p acute respiratory failure and hypoxia. PMH includes developmental delay, plural effusion, pericadial effusion, SOB, and Mediastinal Mass bipopsy (pending results)   Clinical Impression   Patient presenting with decreased independence in self-care, functional mobility, balance, safety, and strength/endurance. Patient poor historian, family in room assisting in providing PLOF. At discharge family  reports they will be able to assist as needed, but not physically. Reporting at baseline patient was independent, only used SPC to ambulate in the community. Patient has a toilet riser, SPC, and RW. Patient current functioning at mod A for bed mobility also requiring multimodal cues to follow commands. Swelling in both BLE observed, Max A for LB dressing. Once sitting EOB patient required mod A for support (posterior lean observed), but eventually progressing to min A. Grooming tasks performed sitting EOB with set up (brushing teeth and washing face). Attempted to transfer sit>stand, but patient was unable stating "I can't do it". +2 assist anticipated. Nurse notified. Patient placed back in supine with mod A. Left in bed with call bell in reach, bed alarm set, family present in room, and all needs met. Patient will benefit from acute OT to increase overall independence in the areas of ADLs, functional mobility, in order to safely discharge to the next venue of care.       Recommendations for follow up therapy are one component of a multi-disciplinary discharge planning process, led by the attending physician.  Recommendations may be updated based on patient status, additional functional criteria and insurance authorization.   Follow Up Recommendations  Skilled nursing-short term  rehab (<3 hours/day)    Assistance Recommended at Discharge Frequent or constant Supervision/Assistance  Patient can return home with the following Two people to help with walking and/or transfers;A little help with bathing/dressing/bathroom;Direct supervision/assist for financial management;Help with stairs or ramp for entrance;Direct supervision/assist for medications management;Assist for transportation    Functional Status Assessment  Patient has had a recent decline in their functional status and demonstrates the ability to make significant improvements in function in a reasonable and predictable amount of time.  Equipment Recommendations  Other (comment) (Defer to next venue of care.)       Precautions / Restrictions Precautions Precautions: Fall Restrictions Weight Bearing Restrictions: No      Mobility Bed Mobility Overal bed mobility: Needs Assistance Bed Mobility: Supine to Sit, Sit to Supine     Supine to sit: Mod assist Sit to supine: Mod assist        Transfers                   General transfer comment: anticipate +2 for safety      Balance Overall balance assessment: Needs assistance Sitting-balance support: Feet supported Sitting balance-Leahy Scale: Poor Sitting balance - Comments: Patient  required mod assist at times to maintain seated upright posture at EOB, eventually progressed to min A Postural control: Posterior lean                                 ADL either performed or assessed with clinical judgement   ADL Overall ADL's : Needs assistance/impaired     Grooming: Oral care;Wash/dry face;Set up;Sitting;Bed level  Vision Patient Visual Report: No change from baseline              Pertinent Vitals/Pain Pain Assessment Pain Assessment: Faces Faces Pain Scale: Hurts even more Pain Location: left calf Pain Intervention(s): Limited activity within patient's  tolerance, Monitored during session     Hand Dominance     Extremity/Trunk Assessment Upper Extremity Assessment Upper Extremity Assessment: Generalized weakness   Lower Extremity Assessment Lower Extremity Assessment: Generalized weakness (swelling/edema in both LE)       Communication Communication Communication: Expressive difficulties;Receptive difficulties   Cognition Arousal/Alertness: Lethargic Behavior During Therapy: Flat affect Overall Cognitive Status: History of cognitive impairments - at baseline                                 General Comments: pt was able to follow simple verbal cues when repeated multiple times and with family encouragement                Home Living Family/patient expects to be discharged to:: Private residence Living Arrangements: Other relatives (Niece) Available Help at Discharge: Family;Available PRN/intermittently (Family reports they will not be able to physically assist patient at discharge.) Type of Home: House Home Access: Stairs to enter;Ramped entrance Entrance Stairs-Number of Steps: 3 Entrance Stairs-Rails: Right;Left Home Layout: One level     Bathroom Shower/Tub: Teacher, early years/pre: Standard     Home Equipment: Conservation officer, nature (2 wheels);Cane - single point;Grab bars - tub/shower;Toilet riser   Additional Comments: Pt poor historian, unable to assist in PLOF      Prior Functioning/Environment Prior Level of Function : Independent/Modified Independent             Mobility Comments: Pt uses SPC in community but nothing inside of home for functional mobility. ADLs Comments: Family reposrts she is able to complete ADLs but does not drive prior to last hospital admission        OT Problem List: Decreased strength;Increased edema;Decreased activity tolerance;Decreased safety awareness      OT Treatment/Interventions: Self-care/ADL training;Therapeutic exercise;Patient/family  education;Energy conservation;Therapeutic activities;Cognitive remediation/compensation    OT Goals(Current goals can be found in the care plan section) Acute Rehab OT Goals Patient Stated Goal: to get better OT Goal Formulation: With patient/family Time For Goal Achievement: 05/25/22 Potential to Achieve Goals: Fair ADL Goals Pt Will Perform Lower Body Bathing: with min assist Pt Will Perform Lower Body Dressing: with min assist Pt Will Transfer to Toilet: with min assist Pt Will Perform Toileting - Clothing Manipulation and hygiene: with min assist  OT Frequency: Min 2X/week       AM-PAC OT "6 Clicks" Daily Activity     Outcome Measure Help from another person eating meals?: None Help from another person taking care of personal grooming?: A Little Help from another person toileting, which includes using toliet, bedpan, or urinal?: A Lot Help from another person bathing (including washing, rinsing, drying)?: A Little Help from another person to put on and taking off regular upper body clothing?: A Little Help from another person to put on and taking off regular lower body clothing?: A Lot 6 Click Score: 17   End of Session Equipment Utilized During Treatment: Rolling walker (2 wheels) Nurse Communication: Mobility status  Activity Tolerance: Patient limited by lethargy;Patient tolerated treatment well Patient left: in bed;with call bell/phone within reach;with bed alarm set;with family/visitor present  OT Visit Diagnosis: Unsteadiness on feet (R26.81);Muscle weakness (  generalized) (M62.81)                Time: 2820-6015 OT Time Calculation (min): 29 min Charges:  OT General Charges $OT Visit: 1 Visit OT Evaluation $OT Eval Moderate Complexity: 1 Mod OT Treatments $Self Care/Home Management : 8-22 mins    Dalene Robards, OTS 05/11/2022, 1:22 PM

## 2022-05-11 NOTE — Progress Notes (Signed)
PROGRESS NOTE    Taylor Johnson  JTT:[id number] DOB: Dec 14, 1945 DOA: 05/10/2022 PCP: [name], MD  107A/[id number]-AA  LOS: 0 days   Brief hospital course:   Assessment & Plan: Taylor Johnson is a 76 y.o. female with medical history significant of recently discovered mediastinal mass, hypertension, OSA on CPAP, developmental delay who presents to the ED with complaints of leg swelling interfering with her walking.   * Acute respiratory failure with hypoxia [facility]) Patient presenting with 3-day history of worsening shortness of breath and lower extremity edema.  BNP only mildly elevated at 134.  CXR no acute finding.  Pt was found to desat to 87% on room air while at rest, unclear etiology.  No hx of asthma or COPD. --There may be an aspect of deconditioning with poor respiratory effort.   --repeat Echo during this hospitalization with again normal LVEF, and showed again small to mod pericardial effusion. --Cr and BUN both increased with just IV lasix 40 mg x1. Plan: --Continue supplemental O2 to keep sats >=90%, wean as tolerated --hold diuretic for now --cont incentive spirometer.  Pericardial effusion Patient presented with chest pain on 10/18 and found to have a mediastinal mass with pericardial effusion.  --current Echo showed the same small to mod pericardial effusion  Generalized weakness Patient complaining of lower extremity weakness due to edema, however on my examination she has global weakness in all 4 extremities.  I suspect this is due to deconditioning given recent hospitalization and sedentary lifestyle since being home.  Low suspicion for CVA given lack of focal findings. -PT/OT --SNF rehab  Mediastinal mass - Oncology consulted on previous admission with outpatient follow-up established - Pathology obtained  OSA on CPAP - CPAP at nighttime  Benign essential HTN - Continue oral hydralazine   DVT prophylaxis: Lovenox SQ Code Status: Full code  Family  Communication: sister updated at bedside today Level of care: Telemetry Medical Dispo:   The patient is from: home Anticipated d/c is to: SNF rehab Anticipated d/c date is: 1-2 days   Subjective and Interval History:  Denied dyspnea.  Swelling reported to be improved.  Was eating her meal and didn't want to talk, so her sister answered most questions for her.  Per PT, pt desat to 87% on room air at rest.   Objective: Vitals:   05/10/22 1936 05/11/22 0358 05/11/22 0813 05/11/22 1635  BP: (!) 110/44 (!) 103/45 (!) 119/50 103/65  Pulse: 85 86 85 94  Resp: '18 17 16 20  '$ Temp: 98.6 F (37 C) 98.2 F (36.8 C) 98.2 F (36.8 C) 98.6 F (37 C)  TempSrc:  Oral    SpO2: 100% 97% 100% 99%  Weight:      Height:        Intake/Output Summary (Last 24 hours) at 05/11/2022 1822 Last data filed at 05/11/2022 1739 Gross per 24 hour  Intake 120 ml  Output 600 ml  Net -480 ml   Filed Weights   05/10/22 1711  Weight: 97 kg    Examination:   Constitutional: NAD, alert, appeared to be oriented HEENT: conjunctivae and lids normal, EOMI CV: No cyanosis.   RESP: normal respiratory effort, on 2L Extremities: mild non-pitting edema in BLE SKIN: warm, dry Neuro: II - XII grossly intact.   Psych: flat mood and affect.     Data Reviewed: I have personally reviewed labs and imaging studies  Time spent: 50 minutes  Enzo Bi, MD [facility] If 7PM-7AM, please contact night-coverage  05/11/2022, 6:22 PM

## 2022-05-12 DIAGNOSIS — J9601 Acute respiratory failure with hypoxia: Secondary | ICD-10-CM | POA: Diagnosis not present

## 2022-05-12 DIAGNOSIS — R531 Weakness: Secondary | ICD-10-CM

## 2022-05-12 DIAGNOSIS — M79672 Pain in left foot: Secondary | ICD-10-CM | POA: Diagnosis present

## 2022-05-12 DIAGNOSIS — M79671 Pain in right foot: Secondary | ICD-10-CM | POA: Diagnosis present

## 2022-05-12 DIAGNOSIS — Z1152 Encounter for screening for COVID-19: Secondary | ICD-10-CM | POA: Diagnosis not present

## 2022-05-12 DIAGNOSIS — F411 Generalized anxiety disorder: Secondary | ICD-10-CM | POA: Diagnosis present

## 2022-05-12 DIAGNOSIS — K219 Gastro-esophageal reflux disease without esophagitis: Secondary | ICD-10-CM | POA: Diagnosis not present

## 2022-05-12 DIAGNOSIS — Z801 Family history of malignant neoplasm of trachea, bronchus and lung: Secondary | ICD-10-CM | POA: Diagnosis not present

## 2022-05-12 DIAGNOSIS — R0602 Shortness of breath: Secondary | ICD-10-CM | POA: Diagnosis present

## 2022-05-12 DIAGNOSIS — Z79899 Other long term (current) drug therapy: Secondary | ICD-10-CM | POA: Diagnosis not present

## 2022-05-12 DIAGNOSIS — J9690 Respiratory failure, unspecified, unspecified whether with hypoxia or hypercapnia: Secondary | ICD-10-CM | POA: Diagnosis not present

## 2022-05-12 DIAGNOSIS — N179 Acute kidney failure, unspecified: Secondary | ICD-10-CM | POA: Diagnosis not present

## 2022-05-12 DIAGNOSIS — R222 Localized swelling, mass and lump, trunk: Secondary | ICD-10-CM | POA: Diagnosis present

## 2022-05-12 DIAGNOSIS — R053 Chronic cough: Secondary | ICD-10-CM | POA: Diagnosis present

## 2022-05-12 DIAGNOSIS — Z8249 Family history of ischemic heart disease and other diseases of the circulatory system: Secondary | ICD-10-CM | POA: Diagnosis not present

## 2022-05-12 DIAGNOSIS — Z7401 Bed confinement status: Secondary | ICD-10-CM | POA: Diagnosis not present

## 2022-05-12 DIAGNOSIS — F32A Depression, unspecified: Secondary | ICD-10-CM | POA: Diagnosis present

## 2022-05-12 DIAGNOSIS — J90 Pleural effusion, not elsewhere classified: Secondary | ICD-10-CM | POA: Diagnosis present

## 2022-05-12 DIAGNOSIS — Z90710 Acquired absence of both cervix and uterus: Secondary | ICD-10-CM | POA: Diagnosis not present

## 2022-05-12 DIAGNOSIS — M1000 Idiopathic gout, unspecified site: Secondary | ICD-10-CM | POA: Diagnosis not present

## 2022-05-12 DIAGNOSIS — M7989 Other specified soft tissue disorders: Secondary | ICD-10-CM | POA: Diagnosis present

## 2022-05-12 DIAGNOSIS — R6889 Other general symptoms and signs: Secondary | ICD-10-CM | POA: Diagnosis not present

## 2022-05-12 DIAGNOSIS — I10 Essential (primary) hypertension: Secondary | ICD-10-CM | POA: Diagnosis not present

## 2022-05-12 DIAGNOSIS — K5900 Constipation, unspecified: Secondary | ICD-10-CM | POA: Diagnosis not present

## 2022-05-12 DIAGNOSIS — F01518 Vascular dementia, unspecified severity, with other behavioral disturbance: Secondary | ICD-10-CM | POA: Diagnosis not present

## 2022-05-12 DIAGNOSIS — R262 Difficulty in walking, not elsewhere classified: Secondary | ICD-10-CM | POA: Diagnosis present

## 2022-05-12 DIAGNOSIS — R1312 Dysphagia, oropharyngeal phase: Secondary | ICD-10-CM | POA: Diagnosis not present

## 2022-05-12 DIAGNOSIS — I3139 Other pericardial effusion (noninflammatory): Secondary | ICD-10-CM | POA: Diagnosis present

## 2022-05-12 DIAGNOSIS — Z841 Family history of disorders of kidney and ureter: Secondary | ICD-10-CM | POA: Diagnosis not present

## 2022-05-12 DIAGNOSIS — Z743 Need for continuous supervision: Secondary | ICD-10-CM | POA: Diagnosis not present

## 2022-05-12 DIAGNOSIS — M6259 Muscle wasting and atrophy, not elsewhere classified, multiple sites: Secondary | ICD-10-CM | POA: Diagnosis not present

## 2022-05-12 DIAGNOSIS — Z741 Need for assistance with personal care: Secondary | ICD-10-CM | POA: Diagnosis not present

## 2022-05-12 DIAGNOSIS — Z6835 Body mass index (BMI) 35.0-35.9, adult: Secondary | ICD-10-CM | POA: Diagnosis not present

## 2022-05-12 DIAGNOSIS — R601 Generalized edema: Secondary | ICD-10-CM | POA: Diagnosis not present

## 2022-05-12 DIAGNOSIS — G4733 Obstructive sleep apnea (adult) (pediatric): Secondary | ICD-10-CM | POA: Diagnosis present

## 2022-05-12 DIAGNOSIS — E669 Obesity, unspecified: Secondary | ICD-10-CM | POA: Diagnosis present

## 2022-05-12 LAB — BASIC METABOLIC PANEL
Anion gap: 8 (ref 5–15)
BUN: 35 mg/dL — ABNORMAL HIGH (ref 8–23)
CO2: 30 mmol/L (ref 22–32)
Calcium: 9 mg/dL (ref 8.9–10.3)
Chloride: 102 mmol/L (ref 98–111)
Creatinine, Ser: 1.08 mg/dL — ABNORMAL HIGH (ref 0.44–1.00)
GFR, Estimated: 54 mL/min — ABNORMAL LOW (ref >60)
Glucose, Bld: 118 mg/dL — ABNORMAL HIGH (ref 70–99)
Potassium: 4.3 mmol/L (ref 3.5–5.1)
Sodium: 140 mmol/L (ref 135–145)

## 2022-05-12 LAB — CBC
HCT: 29.6 % — ABNORMAL LOW (ref 36.0–46.0)
Hemoglobin: 9.1 g/dL — ABNORMAL LOW (ref 12.0–15.0)
MCH: 27.7 pg (ref 26.0–34.0)
MCHC: 30.7 g/dL (ref 30.0–36.0)
MCV: 90 fL (ref 80.0–100.0)
Platelets: 315 10*3/uL (ref 150–400)
RBC: 3.29 MIL/uL — ABNORMAL LOW (ref 3.87–5.11)
RDW: 15.7 % — ABNORMAL HIGH (ref 11.5–15.5)
WBC: 8.1 10*3/uL (ref 4.0–10.5)
nRBC: 0 % (ref 0.0–0.2)

## 2022-05-12 LAB — MAGNESIUM: Magnesium: 2.2 mg/dL (ref 1.7–2.4)

## 2022-05-12 LAB — PROCALCITONIN: Procalcitonin: <0.1 ng/mL

## 2022-05-12 LAB — SARS CORONAVIRUS 2 BY RT PCR: SARS Coronavirus 2 by RT PCR: NEGATIVE

## 2022-05-12 MED ORDER — FUROSEMIDE 10 MG/ML IJ SOLN
40.0000 mg | Freq: Once | INTRAMUSCULAR | Status: AC
  Administered 2022-05-12: 40 mg via INTRAVENOUS
  Filled 2022-05-12: qty 4

## 2022-05-12 NOTE — Progress Notes (Signed)
  PROGRESS NOTE    Taylor Johnson  WPY:[id number] DOB: 06/05/1946 DOA: 05/10/2022 PCP: Taylor Hess, MD  107A/107A-AA  LOS: 0 days   Brief hospital course:   Assessment & Plan: Taylor Johnson is a 76 y.o. female with medical history significant of recently discovered mediastinal mass, hypertension, OSA on CPAP, developmental delay who presents to the ED with complaints of leg swelling interfering with her walking.   * Acute respiratory failure with hypoxia [facility]) Patient presenting with 3-day history of worsening shortness of breath and lower extremity edema.  BNP only mildly elevated at 134.  CXR no acute finding.  Pt was found to desat to 87% on room air while at rest, unclear etiology.  No hx of asthma or COPD. --There may be an aspect of deconditioning with poor respiratory effort.   --repeat Echo during this hospitalization with again normal LVEF, and showed again small to mod pericardial effusion. --Cr and BUN both increased with just IV lasix 40 mg x1. Plan: --Continue supplemental O2 to keep sats >=90%, wean as tolerated --IV lasix 40 mg x1 today --cont incentive spirometer.  Pericardial effusion Patient presented with chest pain on 10/18 and found to have a mediastinal mass with pericardial effusion.  --current Echo showed the same small to mod pericardial effusion  Generalized weakness Patient complaining of lower extremity weakness due to edema, however on my examination she has global weakness in all 4 extremities.  I suspect this is due to deconditioning given recent hospitalization and sedentary lifestyle since being home.  Low suspicion for CVA given lack of focal findings. -PT/OT --SNF rehab  Mediastinal mass - Oncology consulted on previous admission with outpatient follow-up established - Pathology obtained  OSA on CPAP - CPAP at nighttime  Benign essential HTN - Continue oral hydralazine   DVT prophylaxis: Lovenox SQ Code Status: Full code  Family  Communication: sister updated at bedside today Level of care: Med-Surg Dispo:   The patient is from: home Anticipated d/c is to: SNF rehab Anticipated d/c date is: whenever bed available   Subjective and Interval History:  Per vital charting, pt had a fever to 101 early this morning.    Pt complained of pain when standing, therefore resisted standing.     Objective: Vitals:   05/12/22 1330 05/12/22 1338 05/12/22 1400 05/12/22 1608  BP: 105/60   (!) 141/49  Pulse: 80   93  Resp:    19  Temp:    99.2 F (37.3 C)  TempSrc:      SpO2: 94% (!) 89% 94% 95%  Weight:      Height:        Intake/Output Summary (Last 24 hours) at 05/12/2022 1722 Last data filed at 05/12/2022 1000 Gross per 24 hour  Intake 120 ml  Output 800 ml  Net -680 ml   Filed Weights   05/10/22 1711  Weight: 97 kg    Examination:   Constitutional: NAD, awake, but very minimal interaction.   HEENT: conjunctivae and lids normal, EOMI CV: No cyanosis.   RESP: normal respiratory effort, on 2L Extremities: tenderness to palpation to bottom of both feet SKIN: warm, dry Neuro: II - XII grossly intact.   Psych: subdued mood and affect.     Data Reviewed: I have personally reviewed labs and imaging studies  Time spent: 35 minutes  Enzo Bi, MD [facility] If 7PM-7AM, please contact night-coverage 05/12/2022, 5:22 PM

## 2022-05-12 NOTE — Progress Notes (Addendum)
Physical Therapy Treatment Patient Details Name: Taylor Johnson MRN: [id number] DOB: October 25, 1945 Today's Date: 05/12/2022   History of Present Illness 76 yo female pt presents with bilateral LE swelling/ weakness and SOB  s/p acute respiratory failure and hypoxia. PMH includes developmental delay, plural effusion, pericadial effusion, SOB, and Mediastinal Mass bipopsy (pending results)    PT Comments    Pt in bed upon PT entry. Supine<>EOB max x2 assist. Sit<>Stand with total x2 assist and active resistance from pt. Pt required max encouragement from PT to participate in treatment session. PT attempted to wean pt off oxygen per nurse request, but O2 decreased to 84 after returning to bed and pt returned to [facility] after .5 L of oxygen via nasal cannula administered. RN notified. Pt would benefit from skilled physical therapy in order to achieve more independence and functionality with ADLs. Current recommendation remains SNF.    Recommendations for follow up therapy are one component of a multi-disciplinary discharge planning process, led by the attending physician.  Recommendations may be updated based on patient status, additional functional criteria and insurance authorization.  Follow Up Recommendations  Skilled nursing-short term rehab (<3 hours/day) Can patient physically be transported by private vehicle: No   Assistance Recommended at Discharge Frequent or constant Supervision/Assistance  Patient can return home with the following Direct supervision/assist for medications management;Direct supervision/assist for financial management;Two people to help with bathing/dressing/bathroom;Two people to help with walking and/or transfers;Assistance with cooking/housework;Help with stairs or ramp for entrance;Assist for transportation   Equipment Recommendations  Other (comment) (TBD)    Recommendations for Other Services       Precautions / Restrictions Precautions Precautions:  Fall Restrictions Weight Bearing Restrictions: No     Mobility  Bed Mobility Overal bed mobility: Needs Assistance Bed Mobility: Supine to Sit, Sit to Supine     Supine to sit: +2 for physical assistance, Max assist Sit to supine: Max assist, +2 for physical assistance        Transfers Overall transfer level: Needs assistance Equipment used: Rolling walker (2 wheels) Transfers: Sit to/from Stand Sit to Stand: +2 physical assistance, Total assist           General transfer comment: actively resisted sit to stand transfer    Ambulation/Gait                   Stairs             Wheelchair Mobility    Modified Rankin (Stroke Patients Only)       Balance Overall balance assessment: Needs assistance Sitting-balance support: Feet supported Sitting balance-Leahy Scale: Fair Sitting balance - Comments: Pt was able to maintain seated balance at EOB with supervision for majority of session Postural control: Posterior lean   Standing balance-Leahy Scale: Zero Standing balance comment: pt would not fully stand                            Cognition Arousal/Alertness: Awake/alert Behavior During Therapy: Flat affect Overall Cognitive Status: History of cognitive impairments - at baseline                                 General Comments: pt was able to follow simple commands with increased time and speaks minimally with PT        Exercises General Exercises - Lower Extremity Ankle Circles/Pumps: AROM, 5 reps, Right, Left, Strengthening  Long Arc Quad: AROM, Right, Left, 10 reps, Strengthening    General Comments        Pertinent Vitals/Pain Pain Assessment Pain Assessment: Faces Faces Pain Scale: Hurts even more Pain Location: bottom of feet Pain Descriptors / Indicators: Grimacing, Moaning Pain Intervention(s): Limited activity within patient's tolerance, Monitored during session    Home Living                           Prior Function            PT Goals (current goals can now be found in the care plan section) Progress towards PT goals: Progressing toward goals    Frequency    Min 2X/week      PT Plan Current plan remains appropriate    Co-evaluation              AM-PAC PT "6 Clicks" Mobility   Outcome Measure  Help needed turning from your back to your side while in a flat bed without using bedrails?: A Lot Help needed moving from lying on your back to sitting on the side of a flat bed without using bedrails?: A Lot Help needed moving to and from a bed to a chair (including a wheelchair)?: A Lot Help needed standing up from a chair using your arms (e.g., wheelchair or bedside chair)?: Total Help needed to walk in hospital room?: Total Help needed climbing 3-5 steps with a railing? : Total 6 Click Score: 9    End of Session Equipment Utilized During Treatment: Gait belt Activity Tolerance: Patient limited by fatigue Patient left: in bed;with call bell/phone within reach;with bed alarm set Nurse Communication: Mobility status PT Visit Diagnosis: Other abnormalities of gait and mobility (R26.89);Unsteadiness on feet (R26.81);Muscle weakness (generalized) (M62.81);Difficulty in walking, not elsewhere classified (R26.2)     Time: 7357-8978 PT Time Calculation (min) (ACUTE ONLY): 25 min  Charges:  $Therapeutic Activity: 23-37 mins                        [employer], SPT 05/12/2022, 2:55 PM

## 2022-05-12 NOTE — Plan of Care (Signed)
Held 25 mg dose of hydralazine.  Pt's BP was 106/41 and 114/58.  Afternoon dose was also held.  Ran this by Sharion Settler, NP

## 2022-05-12 NOTE — TOC Progression Note (Addendum)
Transition of Care [facility]) - Progression Note    Patient Details  Name: Taylor Johnson MRN: [id number] Date of Birth: 12-11-1945  Transition of Care [facility]) CM/SW [place], [place] Phone Number: 05/12/2022, 3:55 PM  Clinical Narrative:      Rehab is recommending short term rehab at [facility]. SW calling family about the plan and referrals.   1600: SW spoke to the family and they are interested in referrals to SNF. Family requesting [facility] in [place], [place].    Expected Discharge Plan and Services        SNF                                         Social Determinants of Health (SDOH) Interventions    Readmission Risk Interventions    05/05/2022    3:40 PM  Readmission Risk Prevention Plan  Transportation Screening Complete  HRI or [place] Complete  Social Work Consult for [place] Planning/Counseling Complete  Palliative Care Screening Not Applicable  Medication Review Press photographer) Complete

## 2022-05-13 DIAGNOSIS — J9601 Acute respiratory failure with hypoxia: Secondary | ICD-10-CM | POA: Diagnosis not present

## 2022-05-13 DIAGNOSIS — N179 Acute kidney failure, unspecified: Secondary | ICD-10-CM

## 2022-05-13 LAB — BLOOD CULTURE ID PANEL (REFLEXED) - BCID2

## 2022-05-13 LAB — CBC
HCT: 29.7 % — ABNORMAL LOW (ref 36.0–46.0)
Hemoglobin: 9.1 g/dL — ABNORMAL LOW (ref 12.0–15.0)
MCH: 27.5 pg (ref 26.0–34.0)
MCHC: 30.6 g/dL (ref 30.0–36.0)
MCV: 89.7 fL (ref 80.0–100.0)
Platelets: 340 10*3/uL (ref 150–400)
RBC: 3.31 MIL/uL — ABNORMAL LOW (ref 3.87–5.11)
RDW: 15.4 % (ref 11.5–15.5)
WBC: 8 10*3/uL (ref 4.0–10.5)
nRBC: 0 % (ref 0.0–0.2)

## 2022-05-13 LAB — BASIC METABOLIC PANEL
Anion gap: 8 (ref 5–15)
BUN: 31 mg/dL — ABNORMAL HIGH (ref 8–23)
CO2: 30 mmol/L (ref 22–32)
Calcium: 9.1 mg/dL (ref 8.9–10.3)
Chloride: 99 mmol/L (ref 98–111)
Creatinine, Ser: 0.96 mg/dL (ref 0.44–1.00)
GFR, Estimated: >60 mL/min (ref >60)
Glucose, Bld: 123 mg/dL — ABNORMAL HIGH (ref 70–99)
Potassium: 4.5 mmol/L (ref 3.5–5.1)
Sodium: 137 mmol/L (ref 135–145)

## 2022-05-13 LAB — MAGNESIUM: Magnesium: 2.3 mg/dL (ref 1.7–2.4)

## 2022-05-13 MED ORDER — ENOXAPARIN SODIUM 60 MG/0.6ML IJ SOSY
45.0000 mg | PREFILLED_SYRINGE | INTRAMUSCULAR | Status: DC
  Administered 2022-05-13 – 2022-05-23 (×11): 45 mg via SUBCUTANEOUS
  Filled 2022-05-13 (×11): qty 0.6

## 2022-05-13 MED ORDER — FUROSEMIDE 10 MG/ML IJ SOLN
40.0000 mg | Freq: Once | INTRAMUSCULAR | Status: AC
  Administered 2022-05-13: 40 mg via INTRAVENOUS
  Filled 2022-05-13: qty 4

## 2022-05-13 NOTE — Assessment & Plan Note (Signed)
--  Cr increased from 1.01 to 1.35 after IV lasix 40 x1 dose.

## 2022-05-13 NOTE — Progress Notes (Addendum)
PROGRESS NOTE    Taylor Johnson  ALP:[id number] DOB: 01/28/1946 DOA: 05/10/2022 PCP: [name], MD  107A/107A-AA  LOS: 1 day   Brief hospital course:   Assessment & Plan: Taylor Johnson is a 76 y.o. female with medical history significant of recently discovered mediastinal mass, hypertension, OSA on CPAP, developmental delay who presents to the ED with complaints of leg swelling interfering with her walking.   * Acute respiratory failure with hypoxia [facility]) Patient presenting with 3-day history of worsening shortness of breath and lower extremity edema.  BNP only mildly elevated at 134.  CXR no acute finding.  Pt was found to desat to 87% on room air while at rest, unclear etiology.  No hx of asthma or COPD. --There may be an aspect of deconditioning with poor respiratory effort.   --repeat Echo during this hospitalization with again normal LVEF, and showed again small to mod pericardial effusion. --Cr and BUN both increased with just IV lasix 40 mg x1 on admission. Plan: --Continue supplemental O2 to keep sats >=90%, wean as tolerated --repeat IV lasix 40 mg x1 today --cont incentive spirometer.  Pericardial effusion Patient presented with chest pain on 10/18 and found to have a mediastinal mass with pericardial effusion.  --current Echo showed the same small to mod pericardial effusion  Generalized weakness Patient complaining of lower extremity weakness due to edema, however on my examination she has global weakness in all 4 extremities.  I suspect this is due to deconditioning given recent hospitalization and sedentary lifestyle since being home.  Low suspicion for CVA given lack of focal findings. -PT/OT --SNF rehab  Mediastinal mass - Oncology consulted on previous admission with outpatient follow-up established - Pathology obtained  OSA on CPAP - CPAP at nighttime  Benign essential HTN - hold oral hydralazine due to low normal BP  Obesity with body mass index  (BMI) of 30.0 to 39.9 BMI:  35.19  AKI (acute kidney injury) ([place]) --Cr increased from 1.01 to 1.35 after IV lasix 40 x1 dose.    DVT prophylaxis: Lovenox SQ Code Status: Full code  Family Communication: sister updated at bedside today Level of care: Med-Surg Dispo:   The patient is from: home Anticipated d/c is to: SNF rehab Anticipated d/c date is: whenever bed available   Subjective and Interval History:  Pt was more alert and interactive during rounds today.  Still complained of pain in both feet.  No dyspnea.   Objective: Vitals:   05/13/22 0103 05/13/22 0514 05/13/22 0824 05/13/22 1612  BP:  (!) 122/43 (!) 112/46 (!) 126/57  Pulse:  79 83 84  Resp:  '20 16 16  '$ Temp:  97.7 F (36.5 C) 99 F (37.2 C) 98.7 F (37.1 C)  TempSrc:  Oral    SpO2:  92% 100% 99%  Weight: 93 kg     Height:       No intake or output data in the 24 hours ending 05/13/22 1803  Filed Weights   05/10/22 1711 05/13/22 0103  Weight: 97 kg 93 kg    Examination:   Constitutional: NAD, AAOx3, sitting in recliner HEENT: conjunctivae and lids normal, EOMI CV: No cyanosis.   RESP: normal respiratory effort, on 2L Neuro: II - XII grossly intact.   Psych: subdued mood and affect.       Data Reviewed: I have personally reviewed labs and imaging studies  Time spent: 35 minutes  Enzo Bi, MD [facility] If 7PM-7AM, please contact night-coverage 05/13/2022,  6:03 PM

## 2022-05-13 NOTE — Progress Notes (Signed)
Mobility Specialist - Progress Note     05/13/22 1442  Mobility  Activity Transferred from chair to bed;Turned to left side;Turned to right side  Level of Assistance Total care  Distance Ambulated (ft) 0 ft  Mobility Referral No  $Mobility charge 1 Mobility   Pt in recliner upon arrival. Pt agreeable to transfer to bed. Two failed attempts at 2+ max assist STS with walker; no effort from patient to assist with standing attempt. Pt slide transfer to bed from chair 2+ total assist. Rolled left and right max assist for bedpan. RN alert pt on bedpan and pt left with needs at reach and alarm set. This note was written under supervision of [place].   Taylor Johnson Mobility Specialist 05/13/22, 2:57 PM

## 2022-05-13 NOTE — Progress Notes (Signed)
Occupational Therapy Treatment Patient Details Name: Taylor Johnson MRN: [id number] DOB: 20-May-1946 Today's Date: 05/13/2022   History of present illness 76 yo female pt presents with bilateral LE swelling/ weakness and SOB  s/p acute respiratory failure and hypoxia. PMH includes developmental delay, plural effusion, pericadial effusion, SOB, and Mediastinal Mass bipopsy (pending results)   OT comments  Patient in bed upon arrival, family present, and agreeable to OT treatment. Patient and linen soiled in urine upon arrival. Patient performed bed mobility with max A +2. Squat pivot performed to transfer to recliner with total A +2 for physical assist. Linen removed. SP02 removed during transfer SP02 at 85% after transfer, placed back on at 0.5L SP02 at 92%. Purewick malfunctioned and removed, RN notified. Patient left in recliner with call bell in reach, chair alarm set, family in room, and all needs met.    Recommendations for follow up therapy are one component of a multi-disciplinary discharge planning process, led by the attending physician.  Recommendations may be updated based on patient status, additional functional criteria and insurance authorization.    Follow Up Recommendations  Skilled nursing-short term rehab (<3 hours/day)    Assistance Recommended at Discharge Frequent or constant Supervision/Assistance  Patient can return home with the following  Two people to help with walking and/or transfers;Direct supervision/assist for financial management;Help with stairs or ramp for entrance;Direct supervision/assist for medications management;Assist for transportation;A lot of help with bathing/dressing/bathroom;Assistance with cooking/housework   Equipment Recommendations  Other (comment) (Defer to next venue of care.)       Precautions / Restrictions Precautions Precautions: Fall Restrictions Weight Bearing Restrictions: No       Mobility Bed Mobility Overal bed mobility:  Needs Assistance Bed Mobility: Supine to Sit     Supine to sit: +2 for physical assistance, Max assist          Transfers Overall transfer level: Needs assistance Equipment used: Rolling walker (2 wheels) Transfers: Bed to chair/wheelchair/BSC     Squat pivot transfers: Total assist, +2 physical assistance             Balance Overall balance assessment: Needs assistance Sitting-balance support: Feet supported Sitting balance-Leahy Scale: Fair                                     ADL either performed or assessed with clinical judgement    Extremity/Trunk Assessment Upper Extremity Assessment Upper Extremity Assessment: Generalized weakness   Lower Extremity Assessment Lower Extremity Assessment: Generalized weakness   Cervical / Trunk Assessment Cervical / Trunk Assessment: Normal    Vision Patient Visual Report: No change from baseline            Cognition Arousal/Alertness: Awake/alert Behavior During Therapy: Flat affect Overall Cognitive Status: History of cognitive impairments - at baseline                                 General Comments: pt was able to follow simple commands with increased time                   Pertinent Vitals/ Pain       Pain Assessment Pain Assessment: Faces Faces Pain Scale: Hurts even more Pain Location: bottom of feet Pain Descriptors / Indicators: Grimacing, Moaning Pain Intervention(s): Limited activity within patient's tolerance, Monitored during session, Repositioned   Frequency  Min 2X/week        Progress Toward Goals  OT Goals(current goals can now be found in the care plan section)  Progress towards OT goals: Progressing toward goals  Acute Rehab OT Goals Patient Stated Goal: to get better OT Goal Formulation: With patient/family Time For Goal Achievement: 05/25/22 Potential to Achieve Goals: Fair   AM-PAC OT "6 Clicks" Daily Activity     Outcome Measure   Help  from another person eating meals?: None Help from another person taking care of personal grooming?: A Little Help from another person toileting, which includes using toliet, bedpan, or urinal?: A Lot Help from another person bathing (including washing, rinsing, drying)?: A Lot Help from another person to put on and taking off regular upper body clothing?: A Little Help from another person to put on and taking off regular lower body clothing?: A Lot 6 Click Score: 16    End of Session Equipment Utilized During Treatment: Other (comment) (gait belt)  OT Visit Diagnosis: Unsteadiness on feet (R26.81);Muscle weakness (generalized) (M62.81)   Activity Tolerance Patient limited by lethargy;Patient tolerated treatment well   Patient Left in chair;with call bell/phone within reach;with chair alarm set;with family/visitor present   Nurse Communication Mobility status        Time: 1100-1125 OT Time Calculation (min): 25 min  Charges:      Tomasa Blase, OTS 05/13/2022, 12:26 PM

## 2022-05-13 NOTE — Assessment & Plan Note (Signed)
BMI:  35.19

## 2022-05-13 NOTE — TOC Progression Note (Addendum)
Transition of Care [facility]) - Progression Note    Patient Details  Name: Taylor Johnson MRN: [id number] Date of Birth: Dec 10, 1945  Transition of Care [facility]) CM/SW [place], [place] Phone Number: 05/13/2022, 2:00 PM  Clinical Narrative:      SW left a message for [facility] medical records and requested a call back. SW needs to verify the Social Security Number of the patient. [place] [employer] has his social security pulling up another person.   SW will complete Fl2 after confirming SSN.  1450: Medical Records does not have a copy of the SS card. SW calling registration, patient and his family.    1623: SW is sending the [place] Must [employer] a copy of the patient's facesheet which includes a copy of the social security number. SW also talked to hospital registration and the patient's health insurance is always verified along with the social security number.    Expected Discharge Plan and Services      SNF                                           Social Determinants of Health (SDOH) Interventions    Readmission Risk Interventions    05/05/2022    3:40 PM  Readmission Risk Prevention Plan  Transportation Screening Complete  HRI or [place] Complete  Social Work Consult for [place] Planning/Counseling Complete  Palliative Care Screening Not Applicable  Medication Review Press photographer) Complete

## 2022-05-13 NOTE — Progress Notes (Signed)
PHARMACY - PHYSICIAN COMMUNICATION CRITICAL VALUE ALERT - BLOOD CULTURE IDENTIFICATION (BCID)  Taylor Johnson is an 76 y.o. female who presented to [facility] on 05/10/2022 with a chief complaint of acute respiratory failure  Assessment:  1/4 bottles GPC. BCID detected MRSE. Suspect contamination.  Name of physician (or Provider) Contacted: Dr. Billie Ruddy  Current antibiotics: None  Changes to prescribed antibiotics recommended:  Do not recommend initiation of antibiotics based on this result. Patient is afebrile, no leukocytosis, PCT < 0.10 and hemodynamically stable. Continue to monitor off antibiotics  Results for orders placed or performed during the hospital encounter of 05/10/22  Blood Culture ID Panel (Reflexed) (Collected: 05/12/2022  8:47 AM)  Result Value Ref Range   Enterococcus faecalis NOT DETECTED NOT DETECTED   Enterococcus Faecium NOT DETECTED NOT DETECTED   Listeria monocytogenes NOT DETECTED NOT DETECTED   Staphylococcus species DETECTED (A) NOT DETECTED   Staphylococcus aureus (BCID) NOT DETECTED NOT DETECTED   Staphylococcus epidermidis DETECTED (A) NOT DETECTED   Staphylococcus lugdunensis NOT DETECTED NOT DETECTED   Streptococcus species NOT DETECTED NOT DETECTED   Streptococcus agalactiae NOT DETECTED NOT DETECTED   Streptococcus pneumoniae NOT DETECTED NOT DETECTED   Streptococcus pyogenes NOT DETECTED NOT DETECTED   A.calcoaceticus-baumannii NOT DETECTED NOT DETECTED   Bacteroides fragilis NOT DETECTED NOT DETECTED   Enterobacterales NOT DETECTED NOT DETECTED   Enterobacter cloacae complex NOT DETECTED NOT DETECTED   Escherichia coli NOT DETECTED NOT DETECTED   Klebsiella aerogenes NOT DETECTED NOT DETECTED   Klebsiella oxytoca NOT DETECTED NOT DETECTED   Klebsiella pneumoniae NOT DETECTED NOT DETECTED   Proteus species NOT DETECTED NOT DETECTED   Salmonella species NOT DETECTED NOT DETECTED   Serratia marcescens NOT DETECTED NOT DETECTED   Haemophilus  influenzae NOT DETECTED NOT DETECTED   Neisseria meningitidis NOT DETECTED NOT DETECTED   Pseudomonas aeruginosa NOT DETECTED NOT DETECTED   Stenotrophomonas maltophilia NOT DETECTED NOT DETECTED   Candida albicans NOT DETECTED NOT DETECTED   Candida auris NOT DETECTED NOT DETECTED   Candida glabrata NOT DETECTED NOT DETECTED   Candida krusei NOT DETECTED NOT DETECTED   Candida parapsilosis NOT DETECTED NOT DETECTED   Candida tropicalis NOT DETECTED NOT DETECTED   Cryptococcus neoformans/gattii NOT DETECTED NOT DETECTED   Methicillin resistance mecA/C DETECTED (A) NOT DETECTED    Benita Gutter 05/13/2022  1:45 PM

## 2022-05-14 DIAGNOSIS — J9601 Acute respiratory failure with hypoxia: Secondary | ICD-10-CM | POA: Diagnosis not present

## 2022-05-14 LAB — CBC
HCT: 31.3 % — ABNORMAL LOW (ref 36.0–46.0)
Hemoglobin: 9.5 g/dL — ABNORMAL LOW (ref 12.0–15.0)
MCH: 28 pg (ref 26.0–34.0)
MCHC: 30.4 g/dL (ref 30.0–36.0)
MCV: 92.3 fL (ref 80.0–100.0)
Platelets: 378 10*3/uL (ref 150–400)
RBC: 3.39 MIL/uL — ABNORMAL LOW (ref 3.87–5.11)
RDW: 15.5 % (ref 11.5–15.5)
WBC: 6.6 10*3/uL (ref 4.0–10.5)
nRBC: 0 % (ref 0.0–0.2)

## 2022-05-14 LAB — BASIC METABOLIC PANEL
Anion gap: 7 (ref 5–15)
BUN: 31 mg/dL — ABNORMAL HIGH (ref 8–23)
CO2: 34 mmol/L — ABNORMAL HIGH (ref 22–32)
Calcium: 9.7 mg/dL (ref 8.9–10.3)
Chloride: 99 mmol/L (ref 98–111)
Creatinine, Ser: 0.95 mg/dL (ref 0.44–1.00)
GFR, Estimated: >60 mL/min (ref >60)
Glucose, Bld: 119 mg/dL — ABNORMAL HIGH (ref 70–99)
Potassium: 4.5 mmol/L (ref 3.5–5.1)
Sodium: 140 mmol/L (ref 135–145)

## 2022-05-14 LAB — MAGNESIUM: Magnesium: 2.4 mg/dL (ref 1.7–2.4)

## 2022-05-14 MED ORDER — FUROSEMIDE 10 MG/ML IJ SOLN
40.0000 mg | Freq: Every day | INTRAMUSCULAR | Status: DC
  Administered 2022-05-14 – 2022-05-18 (×5): 40 mg via INTRAVENOUS
  Filled 2022-05-14 (×5): qty 4

## 2022-05-14 NOTE — Progress Notes (Signed)
Physical Therapy Treatment Patient Details Name: Taylor Johnson MRN: [id number] DOB: 1945-12-29 Today's Date: 05/14/2022   History of Present Illness 76 yo female pt presents with bilateral LE swelling/ weakness and SOB  s/p acute respiratory failure and hypoxia. PMH includes developmental delay, plural effusion, pericadial effusion, SOB, and Mediastinal Mass bipopsy (pending results)    PT Comments    Pt received in bed, assisted nursing with bed mobility for hygiene. Pt required MaxA and use of rail to perform a "modified" roll L<>R several times, unable to maneuver LE's to assist. Pt declined sitting EOB due to significant pain in B feet, L>R, also warm to touch, nursing notified. Pt required assistance for B LE PROM due to c/o pain. Will try to progress as able. Continue to recommend SNF once medically stable. At current time, pt will need [employer] for OOB transfers.    Recommendations for follow up therapy are one component of a multi-disciplinary discharge planning process, led by the attending physician.  Recommendations may be updated based on patient status, additional functional criteria and insurance authorization.  Follow Up Recommendations  Skilled nursing-short term rehab (<3 hours/day) Can patient physically be transported by private vehicle: No   Assistance Recommended at Discharge Frequent or constant Supervision/Assistance  Patient can return home with the following Direct supervision/assist for medications management;Direct supervision/assist for financial management;Two people to help with bathing/dressing/bathroom;Two people to help with walking and/or transfers;Assistance with cooking/housework;Help with stairs or ramp for entrance;Assist for transportation   Equipment Recommendations  None recommended by PT;Other (comment) (TBD)    Recommendations for Other Services       Precautions / Restrictions Precautions Precautions: Fall Restrictions Weight Bearing  Restrictions: No     Mobility  Bed Mobility Overal bed mobility: Needs Assistance Bed Mobility: Rolling Rolling: Max assist (with use of railing, pt unable to manage LE's)         General bed mobility comments:  (Pt unable to tolerate sitting EOB due to pain)    Transfers                        Ambulation/Gait               General Gait Details:  (Non-ambulatory)   Stairs             Wheelchair Mobility    Modified Rankin (Stroke Patients Only)       Balance                                            Cognition Arousal/Alertness: Awake/alert Behavior During Therapy: Flat affect Overall Cognitive Status: History of cognitive impairments - at baseline                                 General Comments: pt was able to follow simple commands with increased time        Exercises General Exercises - Lower Extremity Ankle Circles/Pumps: PROM, Both, 10 reps Heel Slides: PROM, 5 reps, Both Hip ABduction/ADduction: PROM, Both, 5 reps    General Comments General comments (skin integrity, edema, etc.): pt atrophied throughout, B feet warm and painful to light touch L>R      Pertinent Vitals/Pain Pain Assessment Pain Assessment: Faces Faces Pain Scale: Hurts even more Pain Location: bottom  of feet Pain Descriptors / Indicators: Grimacing, Moaning Pain Intervention(s): Limited activity within patient's tolerance, Monitored during session, Patient requesting pain meds-RN notified, Repositioned    Home Living                          Prior Function            PT Goals (current goals can now be found in the care plan section) Acute Rehab PT Goals Patient Stated Goal: to return to PLOF    Frequency    Min 2X/week      PT Plan Current plan remains appropriate    Co-evaluation              AM-PAC PT "6 Clicks" Mobility   Outcome Measure  Help needed turning from your back to your  side while in a flat bed without using bedrails?: A Lot Help needed moving from lying on your back to sitting on the side of a flat bed without using bedrails?: A Lot Help needed moving to and from a bed to a chair (including a wheelchair)?: A Lot Help needed standing up from a chair using your arms (e.g., wheelchair or bedside chair)?: Total Help needed to walk in hospital room?: Total Help needed climbing 3-5 steps with a railing? : Total 6 Click Score: 9    End of Session         PT Visit Diagnosis: Other abnormalities of gait and mobility (R26.89);Unsteadiness on feet (R26.81);Muscle weakness (generalized) (M62.81);Difficulty in walking, not elsewhere classified (R26.2)     Time: 7353-2992 PT Time Calculation (min) (ACUTE ONLY): 25 min  Charges:  $Therapeutic Exercise: 8-22 mins $Therapeutic Activity: 8-22 mins                    Mikel Cella, PTA    Josie Dixon 05/14/2022, 11:26 AM

## 2022-05-14 NOTE — Progress Notes (Signed)
PROGRESS NOTE    Taylor Johnson  [id number] DOB: January 08, 1946 DOA: 05/10/2022 PCP: [name], MD  107A/107A-AA  LOS: 2 days   Brief hospital course:   Assessment & Plan: Taylor Johnson is a 76 y.o. female with medical history significant of recently discovered mediastinal mass, hypertension, OSA on CPAP, developmental delay who presents to the ED with complaints of leg swelling interfering with her walking.   * Acute respiratory failure with hypoxia [facility]) Patient presenting with 3-day history of worsening shortness of breath and lower extremity edema.  BNP only mildly elevated at 134.  CXR no acute finding.  Pt was found to desat to 87% on room air while at rest, unclear etiology.  No hx of asthma or COPD. --There may be an aspect of deconditioning with poor respiratory effort.   --repeat Echo during this hospitalization with again normal LVEF, and showed again small to mod pericardial effusion. --Cr and BUN both increased with just IV lasix 40 mg x1 on admission, but subsequently tolerated it with stable kidney function Plan: --Continue supplemental O2 to keep sats >=90%, wean as tolerated --cont IV lasix daily --cont incentive spirometer.  Pericardial effusion Patient presented with chest pain on 10/18 and found to have a mediastinal mass with pericardial effusion.  --current Echo showed the same small to mod pericardial effusion  Generalized weakness Patient complaining of lower extremity weakness due to edema, however on my examination she has global weakness in all 4 extremities.  I suspect this is due to deconditioning given recent hospitalization and sedentary lifestyle since being home.  Low suspicion for CVA given lack of focal findings. -PT/OT --SNF rehab  Mediastinal mass - Oncology consulted on previous admission with outpatient follow-up established - Pathology obtained  OSA on CPAP - CPAP at nighttime  Benign essential HTN - hold oral hydralazine due to  low normal BP  Obesity with body mass index (BMI) of 30.0 to 39.9 BMI:  35.19  AKI (acute kidney injury) ([place]) --Cr increased from 1.01 to 1.35 after IV lasix 40 x1 dose.  AKI resolved.   DVT prophylaxis: Lovenox SQ Code Status: Full code  Family Communication: sister updated at bedside today Level of care: Med-Surg Dispo:   The patient is from: home Anticipated d/c is to: SNF rehab Anticipated d/c date is: whenever bed available   Subjective and Interval History:  Pt reported increased urine output with IV lasix, however, urine couldn't be charted due to incontinence.    Very limited mobility today with PT due to pain in her feet.   Objective: Vitals:   05/13/22 2032 05/14/22 0500 05/14/22 0554 05/14/22 0810  BP: (!) 126/52  137/60 125/60  Pulse: 81  79 81  Resp: '18  20 18  '$ Temp: 99.4 F (37.4 C)  99 F (37.2 C) 99.5 F (37.5 C)  TempSrc: Oral   Axillary  SpO2: 100%  100% 94%  Weight:  95.7 kg    Height:        Intake/Output Summary (Last 24 hours) at 05/14/2022 1424 Last data filed at 05/14/2022 1330 Gross per 24 hour  Intake 360 ml  Output --  Net 360 ml    Filed Weights   05/10/22 1711 05/13/22 0103 05/14/22 0500  Weight: 97 kg 93 kg 95.7 kg    Examination:   Constitutional: NAD, AAOx3 HEENT: conjunctivae and lids normal, EOMI CV: No cyanosis.   RESP: normal respiratory effort, on RA Extremities: improved edema in BLE SKIN: warm, dry Neuro:  II - XII grossly intact.   Psych: flat mood and affect.       Data Reviewed: I have personally reviewed labs and imaging studies  Time spent: 35 minutes  Enzo Bi, MD [facility] If 7PM-7AM, please contact night-coverage 05/14/2022, 2:24 PM

## 2022-05-15 DIAGNOSIS — J9601 Acute respiratory failure with hypoxia: Secondary | ICD-10-CM | POA: Diagnosis not present

## 2022-05-15 LAB — CBC
HCT: 32.9 % — ABNORMAL LOW (ref 36.0–46.0)
Hemoglobin: 10 g/dL — ABNORMAL LOW (ref 12.0–15.0)
MCH: 27.9 pg (ref 26.0–34.0)
MCHC: 30.4 g/dL (ref 30.0–36.0)
MCV: 91.6 fL (ref 80.0–100.0)
Platelets: 398 10*3/uL (ref 150–400)
RBC: 3.59 MIL/uL — ABNORMAL LOW (ref 3.87–5.11)
RDW: 15.3 % (ref 11.5–15.5)
WBC: 6.4 10*3/uL (ref 4.0–10.5)
nRBC: 0 % (ref 0.0–0.2)

## 2022-05-15 LAB — CULTURE, BLOOD (ROUTINE X 2)

## 2022-05-15 LAB — BASIC METABOLIC PANEL
Anion gap: 8 (ref 5–15)
BUN: 33 mg/dL — ABNORMAL HIGH (ref 8–23)
CO2: 32 mmol/L (ref 22–32)
Calcium: 9.5 mg/dL (ref 8.9–10.3)
Chloride: 96 mmol/L — ABNORMAL LOW (ref 98–111)
Creatinine, Ser: 0.96 mg/dL (ref 0.44–1.00)
GFR, Estimated: >60 mL/min (ref >60)
Glucose, Bld: 113 mg/dL — ABNORMAL HIGH (ref 70–99)
Potassium: 4.3 mmol/L (ref 3.5–5.1)
Sodium: 136 mmol/L (ref 135–145)

## 2022-05-15 LAB — MAGNESIUM: Magnesium: 2.2 mg/dL (ref 1.7–2.4)

## 2022-05-15 NOTE — Progress Notes (Signed)
PROGRESS NOTE    Taylor Johnson  [id number] DOB: 1946/05/21 DOA: 05/10/2022 PCP: [name], MD  107A/107A-AA  LOS: 3 days   Brief hospital course:   Assessment & Plan: Taylor Johnson is a 76 y.o. female with medical history significant of recently discovered mediastinal mass, hypertension, OSA on CPAP, developmental delay who presents to the ED with complaints of leg swelling interfering with her walking.   * Acute respiratory failure with hypoxia [facility]) Patient presenting with 3-day history of worsening shortness of breath and lower extremity edema.  BNP only mildly elevated at 134.  CXR no acute finding.  Pt was found to desat to 87% on room air while at rest, unclear etiology.  No hx of asthma or COPD. --There may be an aspect of deconditioning with poor respiratory effort.   --repeat Echo during this hospitalization with again normal LVEF, and showed again small to mod pericardial effusion. --Cr and BUN both increased with just IV lasix 40 mg x1 on admission, but subsequently tolerated it with stable kidney function Plan: --Continue supplemental O2 to keep sats >=90%, wean as tolerated --cont IV lasix daily --cont incentive spirometer.  Pericardial effusion Patient presented with chest pain on 10/18 and found to have a mediastinal mass with pericardial effusion.  --current Echo showed the same small to mod pericardial effusion  Generalized weakness Patient complaining of lower extremity weakness due to edema, however on my examination she has global weakness in all 4 extremities.  I suspect this is due to deconditioning given recent hospitalization and sedentary lifestyle since being home.  Low suspicion for CVA given lack of focal findings. -PT/OT --SNF rehab  Mediastinal mass - Oncology consulted on previous admission with outpatient follow-up established - Pathology obtained  OSA on CPAP - CPAP at nighttime  Benign essential HTN - hold oral hydralazine due to  low normal BP  Obesity with body mass index (BMI) of 30.0 to 39.9 BMI:  35.19  AKI (acute kidney injury) ([place]) --Cr increased from 1.01 to 1.35 after IV lasix 40 x1 dose.  AKI resolved.   DVT prophylaxis: Lovenox SQ Code Status: Full code  Family Communication: sister updated at bedside today Level of care: Med-Surg Dispo:   The patient is from: home Anticipated d/c is to: SNF rehab Anticipated d/c date is: whenever bed available   Subjective and Interval History:  Continued to have foot pain.  BLE swelling improved.     Objective: Vitals:   05/14/22 2136 05/15/22 0500 05/15/22 0656 05/15/22 0829  BP: (!) 117/56  (!) 147/79 126/61  Pulse: 89  78 80  Resp: '18  18 20  '$ Temp: 98.1 F (36.7 C)  97.9 F (36.6 C) 99.1 F (37.3 C)  TempSrc: Oral  Oral   SpO2: 100%  100% 99%  Weight:  95.7 kg    Height:        Intake/Output Summary (Last 24 hours) at 05/15/2022 1656 Last data filed at 05/15/2022 1406 Gross per 24 hour  Intake 360 ml  Output --  Net 360 ml    Filed Weights   05/13/22 0103 05/14/22 0500 05/15/22 0500  Weight: 93 kg 95.7 kg 95.7 kg    Examination:   Constitutional: NAD, AAOx3 HEENT: conjunctivae and lids normal, EOMI CV: No cyanosis.   RESP: normal respiratory effort, on 1L Extremities: improved edema in BLE SKIN: warm, dry Neuro: II - XII grossly intact.   Psych: flat mood and affect.     Data Reviewed: I  have personally reviewed labs and imaging studies  Time spent: 35 minutes  Enzo Bi, MD [facility] If 7PM-7AM, please contact night-coverage 05/15/2022, 4:56 PM

## 2022-05-16 DIAGNOSIS — M7989 Other specified soft tissue disorders: Secondary | ICD-10-CM

## 2022-05-16 DIAGNOSIS — J9601 Acute respiratory failure with hypoxia: Secondary | ICD-10-CM | POA: Diagnosis not present

## 2022-05-16 LAB — CBC
HCT: 33.6 % — ABNORMAL LOW (ref 36.0–46.0)
Hemoglobin: 10 g/dL — ABNORMAL LOW (ref 12.0–15.0)
MCH: 26.8 pg (ref 26.0–34.0)
MCHC: 29.8 g/dL — ABNORMAL LOW (ref 30.0–36.0)
MCV: 90.1 fL (ref 80.0–100.0)
Platelets: 446 10*3/uL — ABNORMAL HIGH (ref 150–400)
RBC: 3.73 MIL/uL — ABNORMAL LOW (ref 3.87–5.11)
RDW: 15.3 % (ref 11.5–15.5)
WBC: 6.9 10*3/uL (ref 4.0–10.5)
nRBC: 0 % (ref 0.0–0.2)

## 2022-05-16 LAB — BASIC METABOLIC PANEL
Anion gap: 9 (ref 5–15)
BUN: 33 mg/dL — ABNORMAL HIGH (ref 8–23)
CO2: 31 mmol/L (ref 22–32)
Calcium: 9.6 mg/dL (ref 8.9–10.3)
Chloride: 96 mmol/L — ABNORMAL LOW (ref 98–111)
Creatinine, Ser: 0.82 mg/dL (ref 0.44–1.00)
GFR, Estimated: >60 mL/min (ref >60)
Glucose, Bld: 130 mg/dL — ABNORMAL HIGH (ref 70–99)
Potassium: 4.3 mmol/L (ref 3.5–5.1)
Sodium: 136 mmol/L (ref 135–145)

## 2022-05-16 LAB — MAGNESIUM: Magnesium: 2.3 mg/dL (ref 1.7–2.4)

## 2022-05-16 MED ORDER — POLYETHYLENE GLYCOL 3350 17 G PO PACK
34.0000 g | PACK | Freq: Two times a day (BID) | ORAL | Status: DC
  Administered 2022-05-16 – 2022-05-19 (×6): 34 g via ORAL
  Filled 2022-05-16 (×7): qty 2

## 2022-05-16 NOTE — Assessment & Plan Note (Signed)
--  reportedly causing pain and inability to walk --swelling improved with IV lasix --cont IV lasix 40 mg daily

## 2022-05-16 NOTE — Progress Notes (Signed)
PROGRESS NOTE    Taylor Johnson  [id number] DOB: 07-03-1946 DOA: 05/10/2022 PCP: Taylor Hess, MD  107A/107A-AA  LOS: 4 days   Brief hospital course:   Assessment & Plan: Taylor Johnson is a 76 y.o. female with medical history significant of recently discovered mediastinal mass, hypertension, OSA on CPAP, developmental delay who presents to the ED with complaints of leg swelling interfering with her walking.   * Acute respiratory failure with hypoxia [facility]) Patient presenting with 3-day history of worsening shortness of breath and lower extremity edema.  BNP only mildly elevated at 134.  CXR no acute finding.  Pt was found to desat to 87% on room air while at rest, unclear etiology.  No hx of asthma or COPD. --There may be an aspect of deconditioning with poor respiratory effort.   --repeat Echo during this hospitalization with again normal LVEF, and showed again small to mod pericardial effusion. --Cr and BUN both increased with just IV lasix 40 mg x1 on admission, but subsequently tolerated it with stable kidney function --on room air today Plan: --cont IV lasix daily --cont incentive spirometer.  Pericardial effusion Patient presented with chest pain on 10/18 and found to have a mediastinal mass with pericardial effusion.  --current Echo showed the same small to mod pericardial effusion  Generalized weakness Patient complaining of lower extremity weakness due to edema, however on my examination she has global weakness in all 4 extremities.  I suspect this is due to deconditioning given recent hospitalization and sedentary lifestyle since being home.  Low suspicion for CVA given lack of focal findings. -PT/OT --SNF rehab  Mediastinal mass - Oncology consulted on previous admission with outpatient follow-up established - Pathology obtained  OSA on CPAP - CPAP at nighttime  Benign essential HTN - hold oral hydralazine due to low normal BP  Obesity with body mass  index (BMI) of 30.0 to 39.9 BMI:  35.19  Swelling of both lower extremities --reportedly causing pain and inability to walk --swelling improved with IV lasix --cont IV lasix 40 mg daily  AKI (acute kidney injury) ([facility]) --Cr increased from 1.01 to 1.35 after IV lasix 40 x1 dose.  AKI resolved.   DVT prophylaxis: Lovenox SQ Code Status: Full code  Family Communication: sister updated at bedside today Level of care: Med-Surg Dispo:   The patient is from: home Anticipated d/c is to: SNF rehab Anticipated d/c date is: whenever bed available   Subjective and Interval History:  BLE swelling improved.  Pt now on RA.   Objective: Vitals:   05/16/22 0500 05/16/22 0537 05/16/22 0734 05/16/22 1558  BP:  (!) 127/53 130/83 (!) 105/54  Pulse:  76 63 79  Resp:  '18 18 18  '$ Temp:   98.5 F (36.9 C) 99.4 F (37.4 C)  TempSrc:   Oral   SpO2:  98% 96% 95%  Weight: 94.9 kg     Height:        Intake/Output Summary (Last 24 hours) at 05/16/2022 1909 Last data filed at 05/16/2022 0800 Gross per 24 hour  Intake 120 ml  Output 350 ml  Net -230 ml    Filed Weights   05/14/22 0500 05/15/22 0500 05/16/22 0500  Weight: 95.7 kg 95.7 kg 94.9 kg    Examination:   Constitutional: NAD, AAOx3 HEENT: conjunctivae and lids normal, EOMI CV: No cyanosis.   RESP: normal respiratory effort, on RA Extremities: non-pitting edema improved in BLE SKIN: warm, dry Neuro: II - XII grossly intact.  Psych: flat mood and affect.     Data Reviewed: I have personally reviewed labs and imaging studies  Time spent: 35 minutes  Enzo Bi, MD [facility] If 7PM-7AM, please contact night-coverage 05/16/2022, 7:09 PM

## 2022-05-16 NOTE — Care Management Important Message (Signed)
Important Message  Patient Details  Name: LUCCIA REINHEIMER MRN: [id number] Date of Birth: 1945-10-14   Medicare Important Message Given:  Yes     Dannette Barbara 05/16/2022, 10:48 AM

## 2022-05-16 NOTE — Progress Notes (Signed)
Occupational Therapy Treatment Patient Details Name: SHAQUANNA LYCAN MRN: [id number] DOB: 01/28/1946 Today's Date: 05/16/2022   History of present illness 76 yo female pt presents with bilateral LE swelling/ weakness and SOB  s/p acute respiratory failure and hypoxia. PMH includes developmental delay, plural effusion, pericadial effusion, SOB, and Mediastinal Mass bipopsy (pending results)   OT comments  Upon entering the room, pt supine in bed and initially agreeable to OT intervention. Pt does report fatigue but no pain. OT attempting to assist pt with moving B LEs towards EOB with pt grimacing and calling out in pain. Bed mobility efforts requiring total A this session. Pt refused B LE exercises and attempts to transfer secondary to pain but also refusing pain medication. B UE exercises with use of towel pulled taunt for 10 reps of shoulder elevation with pt needing multiple cues for encouragement and participation. Pt then just closing eyes and reporting fatigue. Pt remains on RA during session with O2 saturation at or above 93%. Session limited secondary to fatigue and pain.    Recommendations for follow up therapy are one component of a multi-disciplinary discharge planning process, led by the attending physician.  Recommendations may be updated based on patient status, additional functional criteria and insurance authorization.    Follow Up Recommendations  Skilled nursing-short term rehab (<3 hours/day)    Assistance Recommended at Discharge Frequent or constant Supervision/Assistance  Patient can return home with the following  Two people to help with walking and/or transfers;Direct supervision/assist for financial management;Help with stairs or ramp for entrance;Direct supervision/assist for medications management;Assist for transportation;A lot of help with bathing/dressing/bathroom;Assistance with cooking/housework   Equipment Recommendations  Other (comment) (defer to next venue of  care)       Precautions / Restrictions Precautions Precautions: Fall Restrictions Weight Bearing Restrictions: No       Mobility Bed Mobility Overal bed mobility: Needs Assistance Bed Mobility: Rolling Rolling: Max assist              Transfers                             ADL either performed or assessed with clinical judgement    Extremity/Trunk Assessment Upper Extremity Assessment Upper Extremity Assessment: Generalized weakness   Lower Extremity Assessment Lower Extremity Assessment: Generalized weakness        Vision Patient Visual Report: No change from baseline            Cognition Arousal/Alertness: Lethargic Behavior During Therapy: Flat affect Overall Cognitive Status: History of cognitive impairments - at baseline                                 General Comments: pt was able to follow simple commands with increased time                   Pertinent Vitals/ Pain       Pain Assessment Pain Assessment: Faces Faces Pain Scale: Hurts even more Pain Location: B LEs Pain Descriptors / Indicators: Grimacing, Moaning Pain Intervention(s): Limited activity within patient's tolerance, Monitored during session, Repositioned  Home Living Family/patient expects to be discharged to:: Private residence  Frequency  Min 2X/week        Progress Toward Goals  OT Goals(current goals can now be found in the care plan section)  Progress towards OT goals: Progressing toward goals  Acute Rehab OT Goals Patient Stated Goal: to return to PLOF OT Goal Formulation: With patient/family Time For Goal Achievement: 05/25/22 Potential to Achieve Goals: [place] Discharge plan remains appropriate;Frequency remains appropriate       AM-PAC OT "6 Clicks" Daily Activity     Outcome Measure   Help from another person eating meals?: None Help from another person  taking care of personal grooming?: A Little Help from another person toileting, which includes using toliet, bedpan, or urinal?: Total Help from another person bathing (including washing, rinsing, drying)?: A Lot Help from another person to put on and taking off regular upper body clothing?: A Little Help from another person to put on and taking off regular lower body clothing?: Total 6 Click Score: 14    End of Session    OT Visit Diagnosis: Unsteadiness on feet (R26.81);Muscle weakness (generalized) (M62.81)   Activity Tolerance Patient limited by lethargy;Patient tolerated treatment well   Patient Left in chair;with call bell/phone within reach;with chair alarm set;with family/visitor present   Nurse Communication Mobility status        Time: 7078-6754 OT Time Calculation (min): 11 min  Charges: OT General Charges $OT Visit: 1 Visit OT Treatments $Therapeutic Activity: 8-22 mins  Darleen Crocker, MS, OTR/L , CBIS ascom [phone]  05/16/22, 11:55 AM

## 2022-05-16 NOTE — Progress Notes (Signed)
Physical Therapy Treatment Patient Details Name: Taylor Johnson MRN: [id number] DOB: 1946/06/10 Today's Date: 05/16/2022   History of Present Illness 76 yo female pt presents with bilateral LE swelling/ weakness and SOB  s/p acute respiratory failure and hypoxia. PMH includes developmental delay, plural effusion, pericadial effusion, SOB, and Mediastinal Mass bipopsy (pending results)    PT Comments    Patient is agreeable to PT with encouragement. Sister encouraged patient to participate also. The patient participated with LE exercises for strengthening with assistance. Assistance required for bed mobility with fair sitting balance. Standing not attempted due to left foot pain and wanting to return to bed. The patient will need SNF placement at discharge. PT will continue to follow to maximize independence and facilitate return to prior level of function.    Recommendations for follow up therapy are one component of a multi-disciplinary discharge planning process, led by the attending physician.  Recommendations may be updated based on patient status, additional functional criteria and insurance authorization.  Follow Up Recommendations  Skilled nursing-short term rehab (<3 hours/day) Can patient physically be transported by private vehicle: No   Assistance Recommended at Discharge Frequent or constant Supervision/Assistance  Patient can return home with the following Direct supervision/assist for medications management;Direct supervision/assist for financial management;Two people to help with bathing/dressing/bathroom;Two people to help with walking and/or transfers;Assistance with cooking/housework;Help with stairs or ramp for entrance;Assist for transportation   Equipment Recommendations  None recommended by PT    Recommendations for Other Services       Precautions / Restrictions Precautions Precautions: Fall Restrictions Weight Bearing Restrictions: No     Mobility  Bed  Mobility Overal bed mobility: Needs Assistance       Supine to sit: Max assist Sit to supine: Max assist   General bed mobility comments: assistance for BLE support. verbal cues for technique, sequencing. increased time and effort required    Transfers                   General transfer comment: not attempted    Ambulation/Gait                   Stairs             Wheelchair Mobility    Modified Rankin (Stroke Patients Only)       Balance Overall balance assessment: Needs assistance Sitting-balance support: Feet supported Sitting balance-Leahy Scale: Fair                                      Cognition Arousal/Alertness: Lethargic Behavior During Therapy: Flat affect Overall Cognitive Status: History of cognitive impairments - at baseline                                 General Comments: pt was able to follow simple commands with increased time        Exercises General Exercises - Lower Extremity Ankle Circles/Pumps: AAROM, Strengthening, Both, 10 reps, PROM, Supine, Limitations Ankle Circles/Pumps Limitations: pain in left foot. PROM/AAROM as patient will participate Heel Slides: AAROM, Strengthening, Both, 10 reps, Supine Hip ABduction/ADduction: AAROM, Strengthening, Both, 10 reps, Supine Other Exercises Other Exercises: verbal and visual cues for initiaion and follow through of exercise    General Comments        Pertinent Vitals/Pain Pain Assessment Pain Assessment: PAINAD Faces  Pain Scale: Hurts even more Pain Location: Left foot Pain Descriptors / Indicators: Grimacing Pain Intervention(s): Limited activity within patient's tolerance    Home Living Family/patient expects to be discharged to:: Private residence                        Prior Function            PT Goals (current goals can now be found in the care plan section) Acute Rehab PT Goals Patient Stated Goal: none  stated PT Goal Formulation: With patient/family Time For Goal Achievement: 05/25/22 Potential to Achieve Goals: Fair Progress towards PT goals: Progressing toward goals    Frequency    Min 2X/week      PT Plan Current plan remains appropriate    Co-evaluation              AM-PAC PT "6 Clicks" Mobility   Outcome Measure  Help needed turning from your back to your side while in a flat bed without using bedrails?: A Lot Help needed moving from lying on your back to sitting on the side of a flat bed without using bedrails?: A Lot Help needed moving to and from a bed to a chair (including a wheelchair)?: A Lot Help needed standing up from a chair using your arms (e.g., wheelchair or bedside chair)?: Total Help needed to walk in hospital room?: Total Help needed climbing 3-5 steps with a railing? : Total 6 Click Score: 9    End of Session   Activity Tolerance: Patient limited by fatigue;Patient limited by lethargy Patient left: in bed;with call bell/phone within reach;with bed alarm set;with family/visitor present   PT Visit Diagnosis: Other abnormalities of gait and mobility (R26.89);Unsteadiness on feet (R26.81);Muscle weakness (generalized) (M62.81);Difficulty in walking, not elsewhere classified (R26.2)     Time: 1749-4496 PT Time Calculation (min) (ACUTE ONLY): 22 min  Charges:  $Therapeutic Activity: 8-22 mins                    Minna Merritts, PT, MPT    Percell Locus 05/16/2022, 1:05 PM

## 2022-05-17 DIAGNOSIS — J9601 Acute respiratory failure with hypoxia: Secondary | ICD-10-CM | POA: Diagnosis not present

## 2022-05-17 DIAGNOSIS — M79671 Pain in right foot: Secondary | ICD-10-CM

## 2022-05-17 LAB — CULTURE, BLOOD (ROUTINE X 2)
Culture: NO GROWTH
Special Requests: ADEQUATE

## 2022-05-17 MED ORDER — GABAPENTIN 600 MG PO TABS
600.0000 mg | ORAL_TABLET | Freq: Once | ORAL | Status: AC
  Administered 2022-05-17: 600 mg via ORAL
  Filled 2022-05-17: qty 1

## 2022-05-17 MED ORDER — DICLOFENAC SODIUM 1 % EX GEL
2.0000 g | Freq: Four times a day (QID) | CUTANEOUS | Status: DC | PRN
  Filled 2022-05-17 (×2): qty 100

## 2022-05-17 NOTE — TOC Progression Note (Addendum)
Transition of Care [facility]) - Progression Note    Patient Details  Name: ZAIAH CREDEUR MRN: [id number] Date of Birth: May 13, 1946  Transition of Care [facility]) CM/SW [place], [place] Phone Number: 05/17/2022, 12:55 PM  Clinical Narrative:     SW spoke to registration at [facility] [phone] and explained that TOC needs a copy of the registration showing the patient's social security number to show  Must PASSR. SW explained to registration that the patient cannot discharge to SNF without a PASSR number.  Registration will call SW back.  Family states that the patient's SSN is [id number].       Expected Discharge Plan and Services                              SNF                   Social Determinants of Health (SDOH) Interventions    Readmission Risk Interventions    05/05/2022    3:40 PM  Readmission Risk Prevention Plan  Transportation Screening Complete  HRI or [place] Complete  Social Work Consult for [place] Planning/Counseling Complete  Palliative Care Screening Not Applicable  Medication Review Press photographer) Complete

## 2022-05-17 NOTE — Progress Notes (Signed)
PROGRESS NOTE    Taylor Johnson  URK:[id number] DOB: 1946/06/08 DOA: 05/10/2022 PCP: [name], MD  107A/107A-AA  LOS: 5 days   Brief hospital course:   Assessment & Plan: Taylor Johnson is a 76 y.o. female with medical history significant of recently discovered mediastinal mass, hypertension, OSA on CPAP, developmental delay who presents to the ED with complaints of leg swelling interfering with her walking.   * Acute respiratory failure with hypoxia [facility]) Patient presenting with 3-day history of worsening shortness of breath and lower extremity edema.  BNP only mildly elevated at 134.  CXR no acute finding.  Pt was found to desat to 87% on room air while at rest, unclear etiology.  No hx of asthma or COPD. --There may be an aspect of deconditioning with poor respiratory effort.   --repeat Echo during this hospitalization with again normal LVEF, and showed again small to mod pericardial effusion. --Cr and BUN both increased with just IV lasix 40 mg x1 on admission, but subsequently tolerated it with stable kidney function --weaned down to room air on 10/30 Plan: --cont IV lasix daily --cont incentive spirometer.  Pericardial effusion Patient presented with chest pain on 10/18 and found to have a mediastinal mass with pericardial effusion.  --current Echo showed the same small to mod pericardial effusion  Generalized weakness Patient complaining of lower extremity weakness due to edema, however on my examination she has global weakness in all 4 extremities.  I suspect this is due to deconditioning given recent hospitalization and sedentary lifestyle since being home.  Low suspicion for CVA given lack of focal findings. -PT/OT --SNF rehab  Mediastinal mass - Oncology consulted on previous admission with outpatient follow-up established - Pathology obtained  OSA on CPAP - CPAP at nighttime  Benign essential HTN - hold oral hydralazine due to low normal BP  Obesity with  body mass index (BMI) of 30.0 to 39.9 BMI:  35.19  Pain in both feet --Pain with light palpation of bottom of feet and dorsiflexion of feet.  Foot pain limiting pt's mobility. --possibly due to prolonged immobility and development of contracture Plan: --encourage ROM of both feet --encourage family to give pt foot massages  Swelling of both lower extremities --reportedly causing pain and inability to walk --swelling improved with IV lasix --cont IV lasix 40 mg daily  AKI (acute kidney injury) ([place]) --Cr increased from 1.01 to 1.35 after IV lasix 40 x1 dose.  AKI resolved.   DVT prophylaxis: Lovenox SQ Code Status: Full code  Family Communication: sister updated at bedside today Level of care: Med-Surg Dispo:   The patient is from: home Anticipated d/c is to: SNF rehab Anticipated d/c date is: whenever bed available   Subjective and Interval History:  Persistent bilateral foot pain, with palpation or any dorsiflexion.     Objective: Vitals:   05/16/22 2101 05/17/22 0537 05/17/22 0747 05/17/22 1523  BP: 128/60 (!) 113/50 (!) 126/58 (!) 119/43  Pulse: 81 73 68 73  Resp: '16 16 16 17  '$ Temp: 98.9 F (37.2 C) 98.8 F (37.1 C) 98.9 F (37.2 C) 98.6 F (37 C)  TempSrc:      SpO2: 96% 97% 95% 93%  Weight:  91 kg    Height:        Intake/Output Summary (Last 24 hours) at 05/17/2022 2026 Last data filed at 05/17/2022 1428 Gross per 24 hour  Intake 360 ml  Output 350 ml  Net 10 ml    Danley Danker  Weights   05/15/22 0500 05/16/22 0500 05/17/22 0537  Weight: 95.7 kg 94.9 kg 91 kg    Examination:   Constitutional: NAD, AAOx3 HEENT: conjunctivae and lids normal, EOMI CV: No cyanosis.   RESP: normal respiratory effort, on RA Extremities: non-pitting edema improved in BLE.  Pain with light palpation of bottom of feet and dorsiflexion of feet SKIN: warm, dry Neuro: II - XII grossly intact.   Psych: flat mood and affect.     Data Reviewed: I have personally reviewed  labs and imaging studies  Time spent: 25 minutes  Enzo Bi, MD [facility] If 7PM-7AM, please contact night-coverage 05/17/2022, 8:26 PM

## 2022-05-17 NOTE — Progress Notes (Signed)
       CROSS COVER NOTE  NAME: LYNDIA BURY MRN: [id number] DOB : July 15, 1946 ATTENDING PHYSICIAN: Enzo Bi, MD    Date of Service   05/17/2022   HPI/Events of Note   Medication request received for ongoing foot pain refractory to tylenol  Interventions   Assessment/Plan:  Voltaren gel Gabapentin     This document was prepared using Dragon voice recognition software and may include unintentional dictation errors.  Neomia Glass DNP, MBA, FNP-BC Nurse Practitioner [facility] Pager [phone]

## 2022-05-17 NOTE — Progress Notes (Signed)
Physical Therapy Treatment Patient Details Name: Taylor Johnson MRN: [id number] DOB: 20-Aug-1945 Today's Date: 05/17/2022   History of Present Illness 76 yo female pt presents with bilateral LE swelling/ weakness and SOB  s/p acute respiratory failure and hypoxia. PMH includes developmental delay, plural effusion, pericadial effusion, SOB, and Mediastinal Mass bipopsy (pending results)    PT Comments    Patient agreeable to PT with encouragement. She declined getting OOB but was agreeable LE exercises for strengthening with encouragement. Maximal assistance for repositioning in bed for pressure relief. Recommend to continue PT to maximize independence and facilitate return to prior level of function.    Recommendations for follow up therapy are one component of a multi-disciplinary discharge planning process, led by the attending physician.  Recommendations may be updated based on patient status, additional functional criteria and insurance authorization.  Follow Up Recommendations  Skilled nursing-short term rehab (<3 hours/day) Can patient physically be transported by private vehicle: No   Assistance Recommended at Discharge Frequent or constant Supervision/Assistance  Patient can return home with the following Direct supervision/assist for medications management;Direct supervision/assist for financial management;Two people to help with bathing/dressing/bathroom;Two people to help with walking and/or transfers;Assistance with cooking/housework;Help with stairs or ramp for entrance;Assist for transportation   Equipment Recommendations  None recommended by PT    Recommendations for Other Services       Precautions / Restrictions Precautions Precautions: Fall Restrictions Weight Bearing Restrictions: No     Mobility  Bed Mobility Overal bed mobility: Needs Assistance Bed Mobility: Rolling Rolling: Max assist         General bed mobility comments: maximal assistance for  rolling to the right. faciliaton and cues for reaching across midline to facilitate rolling. patient declined getting up to chair    Transfers                        Ambulation/Gait                   Stairs             Wheelchair Mobility    Modified Rankin (Stroke Patients Only)       Balance                                            Cognition Arousal/Alertness: Lethargic Behavior During Therapy: Flat affect Overall Cognitive Status: History of cognitive impairments - at baseline                                 General Comments: pt was able to follow simple commands with increased time        Exercises General Exercises - Lower Extremity Ankle Circles/Pumps: AAROM, Strengthening, Both, 10 reps, Supine Ankle Circles/Pumps Limitations: pain in left foot. PROM/AAROM as patient will participate Heel Slides: AAROM, Strengthening, Both, 10 reps, Supine Hip ABduction/ADduction: AAROM, Strengthening, Both, 10 reps, Supine Other Exercises Other Exercises: verbal cues for technique for strengthening. patient needs maximal cues for encouragement to participate    General Comments        Pertinent Vitals/Pain Pain Assessment Pain Assessment: Faces Faces Pain Scale: Hurts little more Pain Location: Left foot Pain Descriptors / Indicators: Grimacing Pain Intervention(s): Limited activity within patient's tolerance    Home Living  Prior Function            PT Goals (current goals can now be found in the care plan section) Acute Rehab PT Goals Patient Stated Goal: none stated PT Goal Formulation: Patient unable to participate in goal setting Time For Goal Achievement: 05/25/22 Potential to Achieve Goals: Fair Progress towards PT goals: Progressing toward goals    Frequency    Min 2X/week      PT Plan Current plan remains appropriate    Co-evaluation               AM-PAC PT "6 Clicks" Mobility   Outcome Measure  Help needed turning from your back to your side while in a flat bed without using bedrails?: A Lot Help needed moving from lying on your back to sitting on the side of a flat bed without using bedrails?: A Lot Help needed moving to and from a bed to a chair (including a wheelchair)?: A Lot Help needed standing up from a chair using your arms (e.g., wheelchair or bedside chair)?: Total Help needed to walk in hospital room?: Total Help needed climbing 3-5 steps with a railing? : Total 6 Click Score: 9    End of Session   Activity Tolerance:  (self limiting) Patient left: in bed;with call bell/phone within reach;with bed alarm set Nurse Communication: Mobility status PT Visit Diagnosis: Other abnormalities of gait and mobility (R26.89);Unsteadiness on feet (R26.81);Muscle weakness (generalized) (M62.81);Difficulty in walking, not elsewhere classified (R26.2)     Time: 6333-5456 PT Time Calculation (min) (ACUTE ONLY): 8 min  Charges:  $Therapeutic Exercise: 8-22 mins                     Minna Merritts, PT, MPT    Percell Locus 05/17/2022, 4:08 PM

## 2022-05-17 NOTE — Assessment & Plan Note (Signed)
--  Pain with light palpation of bottom of feet and dorsiflexion of feet.  Foot pain limiting pt's mobility. --possibly due to prolonged immobility and development of contracture Plan: --encourage ROM of both feet --encourage family to give pt foot massages

## 2022-05-18 DIAGNOSIS — J9601 Acute respiratory failure with hypoxia: Secondary | ICD-10-CM | POA: Diagnosis not present

## 2022-05-18 LAB — BASIC METABOLIC PANEL
Anion gap: 7 (ref 5–15)
BUN: 33 mg/dL — ABNORMAL HIGH (ref 8–23)
CO2: 34 mmol/L — ABNORMAL HIGH (ref 22–32)
Calcium: 9.9 mg/dL (ref 8.9–10.3)
Chloride: 97 mmol/L — ABNORMAL LOW (ref 98–111)
Creatinine, Ser: 1.08 mg/dL — ABNORMAL HIGH (ref 0.44–1.00)
GFR, Estimated: 54 mL/min — ABNORMAL LOW (ref >60)
Glucose, Bld: 118 mg/dL — ABNORMAL HIGH (ref 70–99)
Potassium: 4.3 mmol/L (ref 3.5–5.1)
Sodium: 138 mmol/L (ref 135–145)

## 2022-05-18 LAB — CBC
HCT: 34.9 % — ABNORMAL LOW (ref 36.0–46.0)
Hemoglobin: 10.3 g/dL — ABNORMAL LOW (ref 12.0–15.0)
MCH: 27.3 pg (ref 26.0–34.0)
MCHC: 29.5 g/dL — ABNORMAL LOW (ref 30.0–36.0)
MCV: 92.6 fL (ref 80.0–100.0)
Platelets: 450 10*3/uL — ABNORMAL HIGH (ref 150–400)
RBC: 3.77 MIL/uL — ABNORMAL LOW (ref 3.87–5.11)
RDW: 15.4 % (ref 11.5–15.5)
WBC: 7.4 10*3/uL (ref 4.0–10.5)
nRBC: 0 % (ref 0.0–0.2)

## 2022-05-18 LAB — MAGNESIUM: Magnesium: 2.4 mg/dL (ref 1.7–2.4)

## 2022-05-18 MED ORDER — FUROSEMIDE 40 MG PO TABS
40.0000 mg | ORAL_TABLET | Freq: Every day | ORAL | Status: DC
  Administered 2022-05-19 – 2022-05-21 (×3): 40 mg via ORAL
  Filled 2022-05-18 (×3): qty 1

## 2022-05-18 NOTE — TOC Progression Note (Signed)
Transition of Care [facility] - Progression Note    Patient Details  Name: Taylor Johnson MRN: [id number] Date of Birth: [birth year]  Transition of Care [facility]) CM/SW [place], [place] Phone Number: 05/18/2022, 5:25 PM  Clinical Narrative:      The patient triggered a level II PASSR. Patient has a PET scan scheduled and it may need to be rescheduled due to additional discharge planning needs.      Expected Discharge Plan and Services      SNF                                           Social Determinants of Health (SDOH) Interventions    Readmission Risk Interventions    05/05/2022    3:40 PM  Readmission Risk Prevention Plan  Transportation Screening Complete  HRI or [place] Complete  Social Work Consult for [place] Planning/Counseling Complete  Palliative Care Screening Not Applicable  Medication Review Press photographer) Complete

## 2022-05-18 NOTE — Progress Notes (Signed)
Occupational Therapy Treatment Patient Details Name: Taylor Johnson MRN: [id number] DOB: 01-Jul-1946 Today's Date: 05/18/2022   History of present illness 76 yo female pt presents with bilateral LE swelling/ weakness and SOB  s/p acute respiratory failure and hypoxia. PMH includes developmental delay, plural effusion, pericadial effusion, SOB, and Mediastinal Mass bipopsy (pending results)   OT comments  Patient in bed, flat affect, but awake upon arrival, and agreeable to OT services. Patient and linen soiled in urine upon arrival. Max A +2 for bed mobility supine <> sit. Patient transferred sit <> stand x2 with total A +2 using RW. VC for hand placement on RW. Patient not able to come into full standing, requiring manual facilitation due to posterior lean. Patient cleaned, linen changed, and peri care perform with total A. Patient was able to roll from left to right with max A for peri-care. Skin breakdown on buttocks, barrier cream applied. Patient left in bed with call bell in reach, nurse present, and all needs met.    Recommendations for follow up therapy are one component of a multi-disciplinary discharge planning process, led by the attending physician.  Recommendations may be updated based on patient status, additional functional criteria and insurance authorization.    Follow Up Recommendations  Skilled nursing-short term rehab (<3 hours/day)    Assistance Recommended at Discharge Frequent or constant Supervision/Assistance  Patient can return home with the following  Two people to help with walking and/or transfers;Direct supervision/assist for financial management;Help with stairs or ramp for entrance;Direct supervision/assist for medications management;Assist for transportation;A lot of help with bathing/dressing/bathroom;Assistance with cooking/housework   Equipment Recommendations  Other (comment) (Defer to next venue of care.)       Precautions / Restrictions  Precautions Precautions: Fall Restrictions Weight Bearing Restrictions: No       Mobility Bed Mobility Overal bed mobility: Needs Assistance Bed Mobility: Rolling, Supine to Sit, Sit to Supine Rolling: Max assist   Supine to sit: Max assist, +2 for physical assistance Sit to supine: Max assist, +2 for physical assistance        Transfers   Equipment used: Rolling walker (2 wheels) Transfers: Sit to/from Stand Sit to Stand: +2 physical assistance, Total assist                 Balance Overall balance assessment: Needs assistance Sitting-balance support: Feet supported Sitting balance-Leahy Scale: Fair   Postural control: Posterior lean Standing balance support: Bilateral upper extremity supported, Reliant on assistive device for balance Standing balance-Leahy Scale: Zero Standing balance comment: pt unable to fully stand.                           ADL either performed or assessed with clinical judgement   ADL Overall ADL's : Needs assistance/impaired                 Upper Body Dressing : Minimal assistance   Lower Body Dressing: Total assistance       Toileting- Clothing Manipulation and Hygiene: Total assistance              Extremity/Trunk Assessment Upper Extremity Assessment Upper Extremity Assessment: Generalized weakness   Lower Extremity Assessment Lower Extremity Assessment: Generalized weakness        Vision Patient Visual Report: No change from baseline            Cognition Arousal/Alertness: Lethargic, Awake/alert Behavior During Therapy: Flat affect Overall Cognitive Status: History of cognitive impairments - at  baseline                                 General Comments: pt was able to follow simple commands with increased time                   Pertinent Vitals/ Pain       Pain Assessment Faces Pain Scale: Hurts little more Pain Location: LE Pain Descriptors / Indicators:  Grimacing Pain Intervention(s): Limited activity within patient's tolerance, Repositioned, Monitored during session   Frequency  Min 2X/week        Progress Toward Goals  OT Goals(current goals can now be found in the care plan section)  Progress towards OT goals: Progressing toward goals  Acute Rehab OT Goals Patient Stated Goal: to return to PLOF OT Goal Formulation: With patient/family Time For Goal Achievement: 05/25/22 Potential to Achieve Goals: [place] Discharge plan remains appropriate;Frequency remains appropriate       AM-PAC OT "6 Clicks" Daily Activity     Outcome Measure   Help from another person eating meals?: None Help from another person taking care of personal grooming?: A Little Help from another person toileting, which includes using toliet, bedpan, or urinal?: Total Help from another person bathing (including washing, rinsing, drying)?: A Lot Help from another person to put on and taking off regular upper body clothing?: A Little Help from another person to put on and taking off regular lower body clothing?: Total 6 Click Score: 14    End of Session Equipment Utilized During Treatment: Gait belt;Rolling walker (2 wheels)  OT Visit Diagnosis: Unsteadiness on feet (R26.81);Muscle weakness (generalized) (M62.81)   Activity Tolerance Patient limited by lethargy;Patient tolerated treatment well   Patient Left in bed;with call bell/phone within reach;with nursing/sitter in room   Nurse Communication Mobility status        Time: 4327-6147 OT Time Calculation (min): 23 min  Charges: OT General Charges $OT Visit: 1 Visit OT Treatments $Self Care/Home Management : 8-22 mins $Therapeutic Activity: 8-22 mins    Adilene Areola, OTS 05/18/2022, 12:48 PM

## 2022-05-18 NOTE — Progress Notes (Signed)
[place] [facility] [place] NAME: Taylor Johnson    MR#:  [id number]  DATE OF BIRTH:  1945-12-09  SUBJECTIVE:   Denies any complaints. No new issues per RN. No family at bedside.   VITALS:  Blood pressure (!) 138/54, pulse 71, temperature 97.7 F (36.5 C), temperature source Oral, resp. rate 18, height '5\' 4"'$  (1.626 m), weight 91.7 kg, SpO2 97 %.  PHYSICAL EXAMINATION:   GENERAL:  76 y.o.-year-old patient lying in the bed with no acute distress.  LUNGS: Normal breath sounds bilaterally, no wheezing CARDIOVASCULAR: S1, S2 normal. No murmurs,   ABDOMEN: Soft, nontender, nondistended. Bowel sounds present.  EXTREMITIES: chronic lower extremity edema    NEUROLOGIC: nonfocal  patient is alert and awake SKIN: No obvious rash, lesion, or ulcer.   LABORATORY PANEL:  CBC Recent Labs  Lab 05/18/22 0455  WBC 7.4  HGB 10.3*  HCT 34.9*  PLT 450*    Chemistries  Recent Labs  Lab 05/18/22 0455  NA 138  K 4.3  CL 97*  CO2 34*  GLUCOSE 118*  BUN 33*  CREATININE 1.08*  CALCIUM 9.9  MG 2.4   Assessment and Plan Taylor Johnson is a 76 y.o. female with medical history significant of recently discovered mediastinal mass, hypertension, OSA on CPAP, developmental delay who presents to the ED with complaints of leg swelling interfering with her walking.   Acute respiratory failure with hypoxia [facility]) Patient presenting with 3-day history of worsening shortness of breath and lower extremity edema.  BNP only mildly elevated at 134.  CXR no acute finding.  Pt was found to desat to 87% on room air while at rest, unclear etiology.  No hx of asthma or COPD. --repeat Echo during this hospitalization with again normal LVEF, and showed again small to mod pericardial effusion. --Cr and BUN both increased with just IV lasix 40 mg x1 on admission, but subsequently tolerated it with stable kidney function --weaned down to room air on 10/30 --change to po lasix  daily --cont incentive spirometer.   Pericardial effusion Patient presented with chest pain on 10/18 and found to have a mediastinal mass with pericardial effusion.  --current Echo showed the same small to mod pericardial effusion   Generalized weakness Patient complaining of lower extremity weakness due to edema, however on my examination she has global weakness in all 4 extremities.  I suspect this is due to deconditioning given recent hospitalization and sedentary lifestyle since being home.  Low suspicion for CVA given lack of focal findings. -PT/OT --SNF rehab   Mediastinal mass - Oncology consulted on previous admission with outpatient follow-up established - Pathology obtained   OSA on CPAP - CPAP at nighttime   Benign essential HTN - hold oral hydralazine due to low normal BP   Obesity with body mass index (BMI) of 30.0 to 39.9 BMI:  35.19   Pain in both feet --Pain with light palpation of bottom of feet and dorsiflexion of feet.  Foot pain limiting pt's mobility. --possibly due to prolonged immobility and development of contracture --encourage ROM of both feet --encourage family to give pt foot massages   Swelling of both lower extremities --reportedly causing pain and inability to walk --swelling improved with lasix   AKI (acute kidney injury) ([place]) --Cr increased from 1.01 to 1.35 after IV lasix 40 x1 dose.  AKI resolved.     DVT prophylaxis: Lovenox SQ Code Status: Full code  Family Communication: none today  Level of care: Med-Surg Dispo:   The patient is from: home Anticipated d/c is to: SNF rehab Anticipated d/c date is: whenever bed available        TOTAL TIME TAKING CARE OF THIS PATIENT: 25 minutes.  >50% time spent on counselling and coordination of care  Note: This dictation was prepared with Dragon dictation along with smaller phrase technology. Any transcriptional errors that result from this process are unintentional.  Fritzi Mandes M.D     [facility]   CC: Primary care physician; [name], MD

## 2022-05-19 ENCOUNTER — Ambulatory Visit: Payer: Medicare Other

## 2022-05-19 ENCOUNTER — Inpatient Hospital Stay: Payer: Medicare Other | Attending: Internal Medicine

## 2022-05-19 DIAGNOSIS — R109 Unspecified abdominal pain: Secondary | ICD-10-CM | POA: Insufficient documentation

## 2022-05-19 DIAGNOSIS — I10 Essential (primary) hypertension: Secondary | ICD-10-CM | POA: Insufficient documentation

## 2022-05-19 DIAGNOSIS — K219 Gastro-esophageal reflux disease without esophagitis: Secondary | ICD-10-CM | POA: Insufficient documentation

## 2022-05-19 DIAGNOSIS — Z79899 Other long term (current) drug therapy: Secondary | ICD-10-CM | POA: Insufficient documentation

## 2022-05-19 DIAGNOSIS — R222 Localized swelling, mass and lump, trunk: Secondary | ICD-10-CM | POA: Insufficient documentation

## 2022-05-19 DIAGNOSIS — J9601 Acute respiratory failure with hypoxia: Secondary | ICD-10-CM | POA: Diagnosis not present

## 2022-05-19 MED ORDER — DIPHENOXYLATE-ATROPINE 2.5-0.025 MG PO TABS
1.0000 | ORAL_TABLET | Freq: Four times a day (QID) | ORAL | Status: DC | PRN
  Administered 2022-05-19 – 2022-05-20 (×3): 1 via ORAL
  Filled 2022-05-19 (×3): qty 1

## 2022-05-19 MED ORDER — DICLOFENAC SODIUM 1 % EX GEL
2.0000 g | Freq: Three times a day (TID) | CUTANEOUS | Status: DC
  Administered 2022-05-19 – 2022-05-24 (×14): 2 g via TOPICAL
  Filled 2022-05-19: qty 100

## 2022-05-19 NOTE — Progress Notes (Signed)
Physical Therapy Treatment Patient Details Name: Taylor Johnson MRN: [id number] DOB: 11-21-1945 Today's Date: 05/19/2022   History of Present Illness 76 yo female pt presents with bilateral LE swelling/ weakness and SOB  s/p acute respiratory failure and hypoxia. PMH includes developmental delay, plural effusion, pericadial effusion, SOB, and Mediastinal Mass bipopsy (pending results)    PT Comments    Patient tolerated session fair today. She was limited by foot discomfort, and was noted to be soiled, requiring extensive rolling and peri care for cleanup. Patient was co-treated today with OT for safety and due to physical assistance requiring for mobility. Patient required maximum assist x 2 for supine to sit. Patient sat edge of bed x8 minutes with moderate assist, progressing to standby assist with cues for forward lean/core activation. See OT note for dynamic reaching/ADL activities performed. Lateral scooting performed along edge of bed with maximum assist x 2, before returning the patient to supine with total assist x 2. Patient remains appropriate to D/C to SNF secondary to mobility impairments and overall activity tolerance.    Recommendations for follow up therapy are one component of a multi-disciplinary discharge planning process, led by the attending physician.  Recommendations may be updated based on patient status, additional functional criteria and insurance authorization.  Follow Up Recommendations  Skilled nursing-short term rehab (<3 hours/day) Can patient physically be transported by private vehicle: No   Assistance Recommended at Discharge Frequent or constant Supervision/Assistance  Patient can return home with the following Direct supervision/assist for medications management;Direct supervision/assist for financial management;Two people to help with bathing/dressing/bathroom;Two people to help with walking and/or transfers;Assistance with cooking/housework;Help with stairs or  ramp for entrance;Assist for transportation   Equipment Recommendations  Other (comment) (to be determined by next facility)    Recommendations for Other Services       Precautions / Restrictions Precautions Precautions: Fall Restrictions Weight Bearing Restrictions: No     Mobility  Bed Mobility Overal bed mobility: Needs Assistance Bed Mobility: Rolling, Supine to Sit, Sit to Supine Rolling: Max assist   Supine to sit: Max assist, +2 for physical assistance Sit to supine: +2 for physical assistance, Total assist   General bed mobility comments: verbal and tactile cues for details    Transfers Overall transfer level: Needs assistance   Lateral scoot along EOB with maximum assist x 2                    Ambulation/Gait                   Stairs             Wheelchair Mobility    Modified Rankin (Stroke Patients Only)       Balance Overall balance assessment: Needs assistance Sitting-balance support: Bilateral upper extremity supported, Feet supported Sitting balance-Leahy Scale: Fair Sitting balance - Comments:  (initially required moderate assist for edge of bed support, progressing to SBA with cues and core activation)                                    Cognition Arousal/Alertness: Lethargic, Awake/alert Behavior During Therapy: Flat affect Overall Cognitive Status: History of cognitive impairments - at baseline  Exercises      General Comments General comments (skin integrity, edema, etc.):  (small skin tears noted in inner thighs when replacing purewick), RN notified      Pertinent Vitals/Pain Pain Assessment Faces Pain Scale: Hurts little more    Home Living                          Prior Function            PT Goals (current goals can now be found in the care plan section) Acute Rehab PT Goals Patient Stated Goal: decreased pain,  return home PT Goal Formulation: Patient unable to participate in goal setting Time For Goal Achievement: 05/26/22 Potential to Achieve Goals: Fair    Frequency    Min 2X/week      PT Plan Current plan remains appropriate    Co-evaluation PT/OT/SLP Co-Evaluation/Treatment: Yes Reason for Co-Treatment: For patient/therapist safety;To address functional/ADL transfers PT goals addressed during session: Mobility/safety with mobility;Balance OT goals addressed during session: ADL's and self-care      AM-PAC PT "6 Clicks" Mobility   Outcome Measure  Help needed turning from your back to your side while in a flat bed without using bedrails?: A Lot Help needed moving from lying on your back to sitting on the side of a flat bed without using bedrails?: A Lot Help needed moving to and from a bed to a chair (including a wheelchair)?: Total Help needed standing up from a chair using your arms (e.g., wheelchair or bedside chair)?: Total Help needed to walk in hospital room?: Total Help needed climbing 3-5 steps with a railing? : Total 6 Click Score: 8    End of Session   Activity Tolerance: Patient limited by pain;Patient limited by lethargy;Patient limited by fatigue Patient left: in bed;with bed alarm set;with call bell/phone within reach Nurse Communication: Mobility status PT Visit Diagnosis: Other abnormalities of gait and mobility (R26.89);Unsteadiness on feet (R26.81);Muscle weakness (generalized) (M62.81);Difficulty in walking, not elsewhere classified (R26.2)     Time: 1430-1455 PT Time Calculation (min) (ACUTE ONLY): 25 min  Charges:  $Therapeutic Activity: 8-22 mins                     Clemetine Marker, PT, DPT, CCS, GCS    Derrell Lolling 05/19/2022, 3:53 PM

## 2022-05-19 NOTE — Care Management Important Message (Signed)
Important Message  Patient Details  Name: DEETRA BOOTON MRN: [id number] Date of Birth: 03/30/1946   Medicare Important Message Given:  Yes     Juliann Pulse A Emry Tobin 05/19/2022, 11:41 AM

## 2022-05-19 NOTE — Progress Notes (Signed)
[place] [facility] [place] NAME: Taylor Johnson    MR#:  [id number]  DATE OF BIRTH:  1946/05/24  SUBJECTIVE:   Denies any complaints. No new issues per RN. No family at bedside.   VITALS:  Blood pressure 127/63, pulse 75, temperature 99 F (37.2 C), temperature source Oral, resp. rate 17, height '5\' 4"'$  (1.626 m), weight 92.9 kg, SpO2 100 %.  PHYSICAL EXAMINATION:   GENERAL:  76 y.o.-year-old patient lying in the bed with no acute distress.  LUNGS: Normal breath sounds bilaterally, no wheezing CARDIOVASCULAR: S1, S2 normal. No murmurs,   ABDOMEN: Soft, nontender, nondistended. Bowel sounds present.  EXTREMITIES: chronic lower extremity edema    NEUROLOGIC: nonfocal  patient is alert and awake SKIN: No obvious rash, lesion, or ulcer.   LABORATORY PANEL:  CBC Recent Labs  Lab 05/18/22 0455  WBC 7.4  HGB 10.3*  HCT 34.9*  PLT 450*     Chemistries  Recent Labs  Lab 05/18/22 0455  NA 138  K 4.3  CL 97*  CO2 34*  GLUCOSE 118*  BUN 33*  CREATININE 1.08*  CALCIUM 9.9  MG 2.4    Assessment and Plan Taylor Johnson is a 76 y.o. female with medical history significant of recently discovered mediastinal mass, hypertension, OSA on CPAP, developmental delay who presents to the ED with complaints of leg swelling interfering with her walking.   Acute respiratory failure with hypoxia [facility]) Patient presenting with 3-day history of worsening shortness of breath and lower extremity edema.  BNP only mildly elevated at 134.  CXR no acute finding.  Pt was found to desat to 87% on room air while at rest, unclear etiology.  No hx of asthma or COPD. --repeat Echo during this hospitalization with again normal LVEF, and showed again small to mod pericardial effusion. --Cr and BUN both increased with just IV lasix 40 mg x1 on admission, but subsequently tolerated it with stable kidney function --weaned down to room air on 10/30 --change to po lasix  daily --cont incentive spirometer.   Pericardial effusion Patient presented with chest pain on 10/18 and found to have a mediastinal mass with pericardial effusion.  --current Echo showed the same small to mod pericardial effusion   Generalized weakness Patient complaining of lower extremity weakness due to edema, however on my examination she has global weakness in all 4 extremities.  I suspect this is due to deconditioning given recent hospitalization and sedentary lifestyle since being home.  Low suspicion for CVA given lack of focal findings. -PT/OT --SNF rehab   Mediastinal mass - Oncology consulted on previous admission with outpatient follow-up established - Pathology obtained   OSA on CPAP - CPAP at nighttime   Benign essential HTN - hold oral hydralazine due to low normal BP   Obesity with body mass index (BMI) of 30.0 to 39.9 BMI:  35.19   Pain in both feet --Pain with light palpation of bottom of feet and dorsiflexion of feet.  Foot pain limiting pt's mobility. --possibly due to prolonged immobility and development of contracture --encourage ROM of both feet --encourage family to give pt foot massages   Swelling of both lower extremities --reportedly causing pain and inability to walk --swelling improved with lasix   AKI (acute kidney injury) ([place]) --Cr increased from 1.01 to 1.35 after IV lasix 40 x1 dose.  AKI resolved.     DVT prophylaxis: Lovenox SQ Code Status: Full code  Family Communication: sister  at bedside Level of care: Med-Surg Dispo:   The patient is from: home Anticipated d/c is to: SNF rehab Anticipated d/c date is: whenever [place] and insurance auth available        TOTAL TIME TAKING CARE OF THIS PATIENT: 25 minutes.  >50% time spent on counselling and coordination of care  Note: This dictation was prepared with Dragon dictation along with smaller phrase technology. Any transcriptional errors that result from this process are  unintentional.  Fritzi Mandes M.D    [facility]   CC: Primary care physician; [name], MD

## 2022-05-19 NOTE — Progress Notes (Signed)
Occupational Therapy Treatment Patient Details Name: Taylor Johnson MRN: [id number] DOB: January 16, 1946 Today's Date: 05/19/2022   History of present illness 76 yo female pt presents with bilateral LE swelling/ weakness and SOB  s/p acute respiratory failure and hypoxia. PMH includes developmental delay, plural effusion, pericadial effusion, SOB, and Mediastinal Mass bipopsy (pending results)   OT comments  Pt seen for OT tx and cotx with PT to optimize pt outcomes. Pt required MAX A +2 for rolling during bed level toileting/pericare and for sidelying to sit EOB, TOTAL A +2 for sit>sidelying at end of session. Pt initially requiring significant assist from PT To maintain static sitting balance, improving with dyn reaching during grooming tasks. Pt set up for brushing teeth/gums and washing face. Pt tolerated sitting EOB ~8-66mn total. MAX A +2 for lateral scooting EOB. Pt endorsed significant fatigue with session. Did well following simple commands with increased time to process. Endorsed bilat feet pain but also noted with some knee pain with movement as well. Pt continues to benefit from skilled OT services. Continue to recommend SNF.    Recommendations for follow up therapy are one component of a multi-disciplinary discharge planning process, led by the attending physician.  Recommendations may be updated based on patient status, additional functional criteria and insurance authorization.    Follow Up Recommendations  Skilled nursing-short term rehab (<3 hours/day)    Assistance Recommended at Discharge Frequent or constant Supervision/Assistance  Patient can return home with the following  Two people to help with walking and/or transfers;Direct supervision/assist for financial management;Help with stairs or ramp for entrance;Direct supervision/assist for medications management;Assist for transportation;A lot of help with bathing/dressing/bathroom;Assistance with cooking/housework   Equipment  Recommendations  Other (comment) (defer to next venue)    Recommendations for Other Services      Precautions / Restrictions Precautions Precautions: Fall Restrictions Weight Bearing Restrictions: No       Mobility Bed Mobility Overal bed mobility: Needs Assistance Bed Mobility: Rolling, Sidelying to Sit, Sit to Sidelying Rolling: Max assist, +2 for physical assistance Sidelying to sit: Max assist, +2 for physical assistance     Sit to sidelying: Total assist, +2 for physical assistance General bed mobility comments: VC sequencing, hand over hand for bed rail use    Transfers Overall transfer level: Needs assistance Equipment used: None Transfers: Bed to chair/wheelchair/BSC            Lateral/Scoot Transfers: +2 physical assistance, Max assist General transfer comment: VC sequencing for lateral scoot along side of bed     Balance Overall balance assessment: Needs assistance Sitting-balance support: Feet supported Sitting balance-Leahy Scale: Fair Sitting balance - Comments: initially poor, requiring MODA  from PT, improving to CGA after dyn reaching to improve midling upright posture                                   ADL either performed or assessed with clinical judgement   ADL Overall ADL's : Needs assistance/impaired     Grooming: Oral care;Wash/dry face;Set up;Sitting Grooming Details (indicate cue type and reason): MOD A improving to CGA for static sitting by PT but improved with dyn balance during grooming tasks with OT                 Toilet Transfer: Maximal assistance;+2 for physical assistance Toilet Transfer Details (indicate cue type and reason): MAX A +2 for rolling side to side for pericare and bed  pan Toileting- Clothing Manipulation and Hygiene: Total assistance              Extremity/Trunk Assessment              Vision       Perception     Praxis      Cognition Arousal/Alertness:  Awake/alert Behavior During Therapy: Flat affect Overall Cognitive Status: History of cognitive impairments - at baseline                                 General Comments: pt followed simple commands with VC and increased processing time        Exercises Other Exercises Other Exercises: pt completed grooming tasks EOB incorporating dynamic reaching Other Exercises: toileting from bed level after pt noted with BM    Shoulder Instructions       General Comments  (small skin tears noted in inner thighs when replacing purewick)    Pertinent Vitals/ Pain       Pain Assessment Pain Assessment: Faces Faces Pain Scale: Hurts even more Pain Location: bilat feet, knees with movement Pain Descriptors / Indicators: Grimacing, Aching Pain Intervention(s): Limited activity within patient's tolerance, Monitored during session, Repositioned  Home Living                                          Prior Functioning/Environment              Frequency  Min 2X/week        Progress Toward Goals  OT Goals(current goals can now be found in the care plan section)  Progress towards OT goals: Progressing toward goals  Acute Rehab OT Goals Patient Stated Goal: return to PLOF OT Goal Formulation: With patient/family Time For Goal Achievement: 05/25/22 Potential to Achieve Goals: [place] Discharge plan remains appropriate;Frequency remains appropriate    Co-evaluation    PT/OT/SLP Co-Evaluation/Treatment: Yes Reason for Co-Treatment: For patient/therapist safety;To address functional/ADL transfers PT goals addressed during session: Mobility/safety with mobility;Balance OT goals addressed during session: ADL's and self-care      AM-PAC OT "6 Clicks" Daily Activity     Outcome Measure   Help from another person eating meals?: None Help from another person taking care of personal grooming?: A Little Help from another person toileting, which  includes using toliet, bedpan, or urinal?: Total Help from another person bathing (including washing, rinsing, drying)?: A Lot Help from another person to put on and taking off regular upper body clothing?: A Little Help from another person to put on and taking off regular lower body clothing?: Total 6 Click Score: 14    End of Session    OT Visit Diagnosis: Unsteadiness on feet (R26.81);Muscle weakness (generalized) (M62.81)   Activity Tolerance Patient tolerated treatment well   Patient Left in bed;with call bell/phone within reach;with bed alarm set   Nurse Communication Mobility status;Other (comment) (needs sacral pad, skin abrasion to L inner thigh)        Time: 7322-0254 OT Time Calculation (min): 29 min  Charges: OT General Charges $OT Visit: 1 Visit OT Treatments $Self Care/Home Management : 8-22 mins  Ardeth Perfect., MPH, MS, OTR/L ascom [phone] 05/19/22, 3:55 PM

## 2022-05-19 NOTE — NC FL2 (Signed)
[place] LEVEL OF CARE SCREENING TOOL     IDENTIFICATION  Patient Name: Taylor Johnson Birthdate: 02-Dec-1945 Sex: female Admission Date (Current Location): 05/10/2022  [facility] and [place] Number:  Engineering geologist and Address:  [facility], [address], [place], [place] [address]      Provider Number: [id number]  Attending Physician Name and Address:  Fritzi Mandes, MD  Relative Name and Phone Number:  [name] [phone]    Current Level of Care: Hospital Recommended Level of Care: [place] Prior Approval Number:  ([employer])  Date Approved/Denied:   PASRR Number: pending  Discharge Plan: SNF    Current Diagnoses: Patient Active Problem List   Diagnosis Date Noted   Pain in both feet 05/17/2022   Swelling of both lower extremities 05/16/2022   AKI (acute kidney injury) ([place]) 05/13/2022   Weakness 05/12/2022   Acute respiratory failure with hypoxia ([place]) 05/10/2022   Mediastinal mass 05/04/2022   Obesity with body mass index (BMI) of 30.0 to 39.9 05/04/2022   Pleural effusion 05/04/2022   Pericardial effusion 05/04/2022   Depression with anxiety 05/04/2022   Dyspnea on exertion 02/28/2022   Abdominal pain, chronic, generalized 12/16/2021   GI bleeding 06/07/2021   CPAP use counseling 08/10/2020   Morbid obesity ([place]) 08/10/2020   GERD (gastroesophageal reflux disease) 11/07/2019   Dizziness 02/04/2019   Generalized weakness 01/14/2019   Benign essential HTN 10/23/2018   Functional systolic murmur 18/56/3149   Developmental delay disorder 02/06/2017   Calculus of gallbladder without cholecystitis without obstruction 01/20/2017   Localized edema 04/15/2016   Microscopic hematuria 04/15/2016   OSA on CPAP 11/18/2015   Urge incontinence of urine 05/12/2015   Diverticulosis of colon 05/11/2015   Anxiety, generalized 05/11/2015    Orientation RESPIRATION BLADDER Height & Weight     Self,  Situation, Place  Normal External catheter Weight: 204 lb 12.9 oz (92.9 kg) Height:  '5\' 4"'$  (162.6 cm)  BEHAVIORAL SYMPTOMS/MOOD NEUROLOGICAL BOWEL NUTRITION STATUS   (none)  (poor attention, memory, developmental delay) Continent Diet (regular)  AMBULATORY STATUS COMMUNICATION OF NEEDS Skin   Extensive Assist (2 person assist)   Skin abrasions (abrasion on buttocks)                       Personal Care Assistance Level of Assistance  Bathing, Feeding, Dressing Bathing Assistance: Maximum assistance Feeding assistance: Limited assistance Dressing Assistance: Maximum assistance     Functional Limitations Info  Speech, Hearing, Sight Sight Info: Adequate Hearing Info: Adequate Speech Info: Impaired (delayed speech)    SPECIAL CARE FACTORS FREQUENCY  PT (By licensed PT), OT (By licensed OT)     PT Frequency: 5x a week OT Frequency: 5x a week            Contractures Contractures Info: Present (feet)    Additional Factors Info  Code Status Code Status Info: Full             Current Medications (05/19/2022):  This is the current hospital active medication list Current Facility-Administered Medications  Medication Dose Route Frequency Provider Last Rate Last Admin   acetaminophen (TYLENOL) tablet 650 mg  650 mg Oral Q6H PRN Jose Persia, MD   650 mg at 05/17/22 2046   Or   acetaminophen (TYLENOL) suppository 650 mg  650 mg Rectal Q6H PRN Jose Persia, MD       diclofenac Sodium (VOLTAREN) 1 % topical gel 2 g  2 g Topical TID Fritzi Mandes, MD   2 g at 05/19/22 1419   enoxaparin (LOVENOX) injection 45 mg  45 mg Subcutaneous Q24H Hallaji, Sheema M, RPH   45 mg at 05/18/22 2014   furosemide (LASIX) tablet 40 mg  40 mg Oral Daily Fritzi Mandes, MD   40 mg at 05/19/22 1833   multivitamin with minerals tablet 1 tablet  1 tablet Oral Daily Jose Persia, MD   1 tablet at 05/19/22 0905   OLANZapine (ZYPREXA) tablet 2.5 mg  2.5 mg Oral QHS Jose Persia, MD   2.5 mg at  05/18/22 2239   pantoprazole (PROTONIX) EC tablet 40 mg  40 mg Oral BID Jose Persia, MD   40 mg at 05/19/22 0905   polyethylene glycol (MIRALAX / GLYCOLAX) packet 17 g  17 g Oral Daily PRN Jose Persia, MD   17 g at 05/16/22 0735   polyethylene glycol (MIRALAX / GLYCOLAX) packet 34 g  34 g Oral BID Enzo Bi, MD   34 g at 05/19/22 5825     Discharge Medications: Please see discharge summary for a list of discharge medications.  Relevant Imaging Results:  Relevant Lab Results:   Additional Information    Colen Darling, LCSWA

## 2022-05-19 NOTE — Plan of Care (Signed)

## 2022-05-19 NOTE — Consult Note (Signed)
   [facility] CM Inpatient Consult   05/19/2022  Carroll Ranney Overall [birth year] [id number]  [facility] [ACO] Patient [employer] Medicare  [facility] Liaison remote coverage review for patient at [facility]  Primary Care Provider:  [name], MD with [place] at [facility]   Patient screened for length of stay with less than 7 days readmission hospitalization with noted extreme high risk score for unplanned readmission risk and to assess for potential [place] Management service needs for post hospital transition.  Review of patient's medical record reveals patient is currently being recommended for a skilled nursing facility level of care.  Reviewed PT/OT evaluations, MD Progress notes, and inpatient [facility] team notes with latest SDOH.   Plan:  Continue to follow progress and disposition to assess for post hospital care management needs.  If patient transitions to a [facility] affiliated facility then the [facility] RN can be alerted to follow for return to community for care coordination needs.  For questions contact:   Natividad Brood, RN BSN [facility] Liaison  [phone] business mobile phone Toll free office [phone]  Fax number: [phone] [email or web address]

## 2022-05-19 NOTE — Care Management Important Message (Signed)
Important Message  Patient Details  Name: Taylor Johnson MRN: [id number] Date of Birth: Aug 25, 1945   Medicare Important Message Given:  Yes     Juliann Pulse A Brianca Fortenberry 05/19/2022, 11:39 AM

## 2022-05-20 ENCOUNTER — Inpatient Hospital Stay: Payer: Medicare Other | Admitting: Internal Medicine

## 2022-05-20 DIAGNOSIS — J9601 Acute respiratory failure with hypoxia: Secondary | ICD-10-CM | POA: Diagnosis not present

## 2022-05-20 NOTE — NC FL2 (Signed)
[place] LEVEL OF CARE SCREENING TOOL     IDENTIFICATION  Patient Name: Taylor Johnson Birthdate: [birth year] Sex: female Admission Date (Current Location): 05/10/2022  [facility] and [place] Number:  Engineering geologist and Address:  [facility], [address] [place], [place] [address]      Provider Number: [id number]  Attending Physician Name and Address:  Fritzi Mandes, MD  Relative Name and Phone Number:  [name] [phone]    Current Level of Care: Hospital Recommended Level of Care: [place] Prior Approval Number:  ([employer])  Date Approved/Denied:   PASRR Number: [id number]  Discharge Plan: SNF    Current Diagnoses: Patient Active Problem List   Diagnosis Date Noted   Pain in both feet 05/17/2022   Swelling of both lower extremities 05/16/2022   AKI (acute kidney injury) ([place]) 05/13/2022   Weakness 05/12/2022   Acute respiratory failure with hypoxia ([place]) 05/10/2022   Mediastinal mass 05/04/2022   Obesity with body mass index (BMI) of 30.0 to 39.9 05/04/2022   Pleural effusion 05/04/2022   Pericardial effusion 05/04/2022   Depression with anxiety 05/04/2022   Dyspnea on exertion 02/28/2022   Abdominal pain, chronic, generalized 12/16/2021   GI bleeding 06/07/2021   CPAP use counseling 08/10/2020   Morbid obesity ([place]) 08/10/2020   GERD (gastroesophageal reflux disease) 11/07/2019   Dizziness 02/04/2019   Generalized weakness 01/14/2019   Benign essential HTN 10/23/2018   Functional systolic murmur 32/20/2542   Developmental delay disorder 02/06/2017   Calculus of gallbladder without cholecystitis without obstruction 01/20/2017   Localized edema 04/15/2016   Microscopic hematuria 04/15/2016   OSA on CPAP 11/18/2015   Urge incontinence of urine 05/12/2015   Diverticulosis of colon 05/11/2015   Anxiety, generalized 05/11/2015    Orientation RESPIRATION BLADDER Height & Weight      Self, Situation, Place  Normal External catheter Weight: 195 lb 5.2 oz (88.6 kg) Height:  '5\' 4"'$  (162.6 cm)  BEHAVIORAL SYMPTOMS/MOOD NEUROLOGICAL BOWEL NUTRITION STATUS   (none)  (none) Continent Diet (regular diet)  AMBULATORY STATUS COMMUNICATION OF NEEDS Skin   Extensive Assist Verbally Skin abrasions (abrasions on buttocks)                       Personal Care Assistance Level of Assistance  Bathing, Feeding, Dressing Bathing Assistance: Maximum assistance Feeding assistance: Limited assistance Dressing Assistance: Maximum assistance     Functional Limitations Info  Sight, Hearing, Speech Sight Info: Adequate Hearing Info: Adequate Speech Info: Impaired    SPECIAL CARE FACTORS FREQUENCY  OT (By licensed OT), PT (By licensed PT)     PT Frequency: 5x a week OT Frequency: 5x a week            Contractures Contractures Info: Present    Additional Factors Info  Code Status Code Status Info: Full Code             Current Medications (05/20/2022):  This is the current hospital active medication list Current Facility-Administered Medications  Medication Dose Route Frequency Provider Last Rate Last Admin   acetaminophen (TYLENOL) tablet 650 mg  650 mg Oral Q6H PRN Jose Persia, MD   650 mg at 05/17/22 2046   Or   acetaminophen (TYLENOL) suppository 650 mg  650 mg Rectal Q6H PRN Jose Persia, MD       diclofenac Sodium (VOLTAREN) 1 % topical gel 2 g  2 g Topical TID Fritzi Mandes, MD  2 g at 05/20/22 0858   diphenoxylate-atropine (LOMOTIL) 2.5-0.025 MG per tablet 1 tablet  1 tablet Oral QID PRN Fritzi Mandes, MD   1 tablet at 05/20/22 1017   enoxaparin (LOVENOX) injection 45 mg  45 mg Subcutaneous Q24H Hallaji, Sheema M, RPH   45 mg at 05/19/22 1955   furosemide (LASIX) tablet 40 mg  40 mg Oral Daily Fritzi Mandes, MD   40 mg at 05/20/22 0851   multivitamin with minerals tablet 1 tablet  1 tablet Oral Daily Jose Persia, MD   1 tablet at 05/20/22 0851    OLANZapine (ZYPREXA) tablet 2.5 mg  2.5 mg Oral QHS Jose Persia, MD   2.5 mg at 05/18/22 2239   pantoprazole (PROTONIX) EC tablet 40 mg  40 mg Oral BID Jose Persia, MD   40 mg at 05/20/22 0851   polyethylene glycol (MIRALAX / GLYCOLAX) packet 17 g  17 g Oral Daily PRN Jose Persia, MD   17 g at 05/16/22 6203     Discharge Medications: Please see discharge summary for a list of discharge medications.  Relevant Imaging Results:  Relevant Lab Results:   Additional Information SSN [id number]  Colen Darling, LCSWA

## 2022-05-20 NOTE — Plan of Care (Signed)
  Problem: Education: Goal: Knowledge of General Education information will improve Description: Including pain rating scale, medication(s)/side effects and non-pharmacologic comfort measures 05/20/2022 2227 by Abigail Butts, RN Outcome: Progressing 05/20/2022 2204 by Abigail Butts, RN Outcome: Progressing   Problem: Health Behavior/Discharge Planning: Goal: Ability to manage health-related needs will improve 05/20/2022 2227 by Abigail Butts, RN Outcome: Progressing 05/20/2022 2204 by Abigail Butts, RN Outcome: Progressing   Problem: Clinical Measurements: Goal: Ability to maintain clinical measurements within normal limits will improve 05/20/2022 2227 by Abigail Butts, RN Outcome: Progressing 05/20/2022 2204 by Abigail Butts, RN Outcome: Progressing Goal: Will remain free from infection 05/20/2022 2227 by Abigail Butts, RN Outcome: Progressing 05/20/2022 2204 by Abigail Butts, RN Outcome: Progressing Goal: Diagnostic test results will improve 05/20/2022 2227 by Abigail Butts, RN Outcome: Progressing 05/20/2022 2204 by Abigail Butts, RN Outcome: Progressing Goal: Respiratory complications will improve 05/20/2022 2227 by Abigail Butts, RN Outcome: Progressing 05/20/2022 2204 by Abigail Butts, RN Outcome: Progressing Goal: Cardiovascular complication will be avoided 05/20/2022 2227 by Abigail Butts, RN Outcome: Progressing 05/20/2022 2204 by Abigail Butts, RN Outcome: Progressing   Problem: Activity: Goal: Risk for activity intolerance will decrease 05/20/2022 2227 by Abigail Butts, RN Outcome: Progressing 05/20/2022 2204 by Abigail Butts, RN Outcome: Progressing   Problem: Nutrition: Goal: Adequate nutrition will be maintained 05/20/2022 2227 by Abigail Butts, RN Outcome: Progressing 05/20/2022 2204 by Abigail Butts, RN Outcome: Progressing   Problem: Coping: Goal: Level of anxiety will decrease 05/20/2022 2227 by Abigail Butts, RN Outcome: Progressing 05/20/2022 2204 by Abigail Butts, RN Outcome: Progressing   Problem: Elimination: Goal: Will not experience complications related to bowel motility 05/20/2022 2227 by Abigail Butts, RN Outcome: Progressing 05/20/2022 2204 by Abigail Butts, RN Outcome: Progressing Goal: Will not experience complications related to urinary retention 05/20/2022 2227 by Abigail Butts, RN Outcome: Progressing 05/20/2022 2204 by Abigail Butts, RN Outcome: Progressing   Problem: Pain Managment: Goal: General experience of comfort will improve 05/20/2022 2227 by Abigail Butts, RN Outcome: Progressing 05/20/2022 2204 by Abigail Butts, RN Outcome: Progressing   Problem: Safety: Goal: Ability to remain free from injury will improve 05/20/2022 2227 by Abigail Butts, RN Outcome: Progressing 05/20/2022 2204 by Abigail Butts, RN Outcome: Progressing   Problem: Skin Integrity: Goal: Risk for impaired skin integrity will decrease 05/20/2022 2227 by Abigail Butts, RN Outcome: Progressing 05/20/2022 2204 by Abigail Butts, RN Outcome: Progressing

## 2022-05-20 NOTE — Progress Notes (Signed)
[place] [facility] [place] NAME: Taylor Johnson    MR#:  [id number]  DATE OF BIRTH:  03/09/1946  SUBJECTIVE:   Patient's younger sister at bedside. Attempted to health PT work with patient. Patient was reluctant to work. She had a crying spell and did not want to work with PT. Tried times two. Sister was present.  VITALS:  Blood pressure (!) 124/56, pulse 72, temperature 98.7 F (37.1 C), temperature source Axillary, resp. rate 20, height '5\' 4"'$  (1.626 m), weight 88.6 kg, SpO2 98 %.  PHYSICAL EXAMINATION:   GENERAL:  76 y.o.-year-old patient lying in the bed with no acute distress. Morbid obesity LUNGS: Normal breath sounds bilaterally, no wheezing CARDIOVASCULAR: S1, S2 normal. No murmurs,   ABDOMEN: Soft, nontender, nondistended. Bowel sounds present.  EXTREMITIES: chronic lower extremity edema    NEUROLOGIC: nonfocal  patient is alert and awake SKIN: No obvious rash, lesion, or ulcer.   LABORATORY PANEL:  CBC Recent Labs  Lab 05/18/22 0455  WBC 7.4  HGB 10.3*  HCT 34.9*  PLT 450*     Chemistries  Recent Labs  Lab 05/18/22 0455  NA 138  K 4.3  CL 97*  CO2 34*  GLUCOSE 118*  BUN 33*  CREATININE 1.08*  CALCIUM 9.9  MG 2.4    Assessment and Plan Taylor Johnson is a 76 y.o. female with medical history significant of recently discovered mediastinal mass, hypertension, OSA on CPAP, developmental delay who presents to the ED with complaints of leg swelling interfering with her walking.   Acute respiratory failure with hypoxia [facility]) Patient presenting with 3-day history of worsening shortness of breath and lower extremity edema.  BNP only mildly elevated at 134.  CXR no acute finding.  Pt was found to desat to 87% on room air while at rest, unclear etiology.  No hx of asthma or COPD. --repeat Echo during this hospitalization with again normal LVEF, and showed again small to mod pericardial effusion. --Cr and BUN both increased with  just IV lasix 40 mg x1 on admission, but subsequently tolerated it with stable kidney function --weaned down to room air on 10/30 --change to po lasix daily --cont incentive spirometer.   Pericardial effusion Patient presented with chest pain on 10/18 and found to have a mediastinal mass with pericardial effusion.  --current Echo showed the same small to mod pericardial effusion   Generalized weakness Patient complaining of lower extremity weakness due to edema, however on my examination she has global weakness in all 4 extremities.  I suspect this is due to deconditioning given recent hospitalization and sedentary lifestyle since being home.  Low suspicion for CVA given lack of focal findings. -PT/OT --SNF rehab   Mediastinal mass - Oncology consulted on previous admission with outpatient follow-up established - Pathology obtained   OSA on CPAP - CPAP at nighttime   Benign essential HTN - hold oral hydralazine due to low normal BP   Obesity with body mass index (BMI) of 30.0 to 39.9 BMI:  35.19   Pain in both feet --Pain with light palpation of bottom of feet and dorsiflexion of feet.  Foot pain limiting pt's mobility. --possibly due to prolonged immobility and development of contracture --encourage ROM of both feet --encourage family to give pt foot massages   Swelling of both lower extremities --reportedly causing pain and inability to walk --swelling improved with lasix   AKI (acute kidney injury) ([place]) --Cr increased from 1.01 to 1.35  after IV lasix 40 x1 dose.  AKI resolved.     DVT prophylaxis: Lovenox SQ Code Status: Full code  Family Communication: sister at bedside Level of care: Med-Surg Dispo:   The patient is from: home Anticipated d/c is to: SNF rehab Anticipated d/c date is: whenever [place] and insurance auth available        TOTAL TIME TAKING CARE OF THIS PATIENT: 25 minutes.  >50% time spent on counselling and coordination of care  Note: This  dictation was prepared with Dragon dictation along with smaller phrase technology. Any transcriptional errors that result from this process are unintentional.  Fritzi Mandes M.D    [facility]   CC: Primary care physician; [name], MD

## 2022-05-20 NOTE — Plan of Care (Signed)

## 2022-05-20 NOTE — Progress Notes (Signed)
Physical Therapy Treatment Patient Details Name: Taylor Johnson MRN: [id number] DOB: 1945-07-19 Today's Date: 05/20/2022   History of Present Illness 76 yo female pt presents with bilateral LE swelling/ weakness and SOB  s/p acute respiratory failure and hypoxia. PMH includes developmental delay, plural effusion, pericadial effusion, SOB, and Mediastinal Mass bipopsy (pending results)    PT Comments    Several attempts to progress mobility this date. Pt continues to be self limiting.  She was apparently walking 10 days ago per pt's sister and currently resists helping staff attempt standing at [place] with MaxA x 2. Pt educated at length regarding importance of participating and pushing self. Pt states she just doesn't want to move. Pt denied poor mood or depression. Continue to recommend SNF placement once medically cleared for d/c.    Recommendations for follow up therapy are one component of a multi-disciplinary discharge planning process, led by the attending physician.  Recommendations may be updated based on patient status, additional functional criteria and insurance authorization.  Follow Up Recommendations  Skilled nursing-short term rehab (<3 hours/day) Can patient physically be transported by private vehicle: No   Assistance Recommended at Discharge Frequent or constant Supervision/Assistance  Patient can return home with the following Direct supervision/assist for medications management;Direct supervision/assist for financial management;Two people to help with bathing/dressing/bathroom;Two people to help with walking and/or transfers;Assistance with cooking/housework;Help with stairs or ramp for entrance;Assist for transportation   Equipment Recommendations  Other (comment) (TBD at next facility)    Recommendations for Other Services       Precautions / Restrictions Precautions Precautions: Fall Restrictions Weight Bearing Restrictions: No     Mobility  Bed Mobility Overal  bed mobility: Needs Assistance Bed Mobility: Rolling, Sidelying to Sit, Sit to Sidelying Rolling: Max assist, +2 for physical assistance Sidelying to sit: Max assist, +2 for physical assistance Supine to sit: Max assist, +2 for physical assistance Sit to supine: +2 for physical assistance, Total assist Sit to sidelying: +2 for physical assistance, Max assist General bed mobility comments: VC sequencing, hand over hand for bed rail use    Transfers Overall transfer level: Needs assistance Equipment used: Rolling walker (2 wheels) Transfers: Sit to/from Stand Sit to Stand: Max assist, +2 physical assistance           General transfer comment: Pt required max vc's and encouragement. Pt resistant to transfer, pushing back, and not taking weight through LE's    Ambulation/Gait               General Gait Details: Pt was walking without difficulty 10 days ago   Marine scientist [place] (Stroke Patients Only)       Balance Overall balance assessment: Needs assistance Sitting-balance support: Feet supported Sitting balance-Leahy Scale: Fair Sitting balance - Comments: No LOB in static supported sitting with strong vc's to maintain   Standing balance support:  (Unable to attain with RW support)                                Cognition Arousal/Alertness: Awake/alert Behavior During Therapy: Flat affect Overall Cognitive Status: History of cognitive impairments - at baseline                                 General Comments: pt followed  simple commands with VC and increased processing time        Exercises General Exercises - Lower Extremity Ankle Circles/Pumps: AAROM, Strengthening, Both, 10 reps, Supine Ankle Circles/Pumps Limitations: pain in left foot. PROM/AAROM as patient will participate Heel Slides: AAROM, Both, 5 reps Hip ABduction/ADduction: AAROM, Both, 5 reps    General Comments  General comments (skin integrity, edema, etc.):  (Pt denies feeling depressed yet is very unmotivated and resistant to pushing herself to progress. Max education provided to pt and pt's sister who is in agreement that pt is self limiting.)      Pertinent Vitals/Pain Pain Assessment Pain Assessment: Faces Faces Pain Scale: Hurts a little bit Pain Location: bilat feet, knees with movement Pain Descriptors / Indicators: Grimacing, Aching Pain Intervention(s): Monitored during session    Home Living                          Prior Function            PT Goals (current goals can now be found in the care plan section) Acute Rehab PT Goals Patient Stated Goal: decreased pain, return home    Frequency    Min 2X/week      PT Plan Current plan remains appropriate    Co-evaluation PT/OT/SLP Co-Evaluation/Treatment: Yes            AM-PAC PT "6 Clicks" Mobility   Outcome Measure  Help needed turning from your back to your side while in a flat bed without using bedrails?: A Lot Help needed moving from lying on your back to sitting on the side of a flat bed without using bedrails?: A Lot Help needed moving to and from a bed to a chair (including a wheelchair)?: Total Help needed standing up from a chair using your arms (e.g., wheelchair or bedside chair)?: Total Help needed to walk in hospital room?: Total Help needed climbing 3-5 steps with a railing? : Total 6 Click Score: 8    End of Session Equipment Utilized During Treatment: Gait belt Activity Tolerance: Patient limited by fatigue Patient left: in bed;with call bell/phone within reach;with bed alarm set;with family/visitor present Nurse Communication: Mobility status PT Visit Diagnosis: Other abnormalities of gait and mobility (R26.89);Unsteadiness on feet (R26.81);Muscle weakness (generalized) (M62.81);Difficulty in walking, not elsewhere classified (R26.2)     Time: 7628-3151 PT Time Calculation (min)  (ACUTE ONLY): 26 min  Charges:  $Therapeutic Exercise: 8-22 mins $Therapeutic Activity: 8-22 mins                    Taylor Johnson, PTA    Taylor Johnson 05/20/2022, 3:33 PM

## 2022-05-20 NOTE — TOC Progression Note (Addendum)
Transition of Care [facility]) - Progression Note    Patient Details  Name: Taylor Johnson MRN: [id number] Date of Birth: 04/24/1946  Transition of Care [facility]) CM/SW [place], [place] Phone Number: 05/20/2022, 10:20 AM  Clinical Narrative:        TOC faxed level II PASSR to [employer] Must at [phone].  1421: TOC has faxed additional medical progress notes to [place] Must.  1524: PASSR is [id number] [id number].  1616: TOC is starting [facility] insurance authorization.    Expected Discharge Plan and Services      SNF                                           Social Determinants of Health (SDOH) Interventions    Readmission Risk Interventions    05/05/2022    3:40 PM  Readmission Risk Prevention Plan  Transportation Screening Complete  HRI or [place] Complete  Social Work Consult for [place] Planning/Counseling Complete  Palliative Care Screening Not Applicable  Medication Review [employer]) Complete

## 2022-05-21 DIAGNOSIS — J9601 Acute respiratory failure with hypoxia: Secondary | ICD-10-CM | POA: Diagnosis not present

## 2022-05-21 MED ORDER — FUROSEMIDE 20 MG PO TABS: 20.0000 mg | ORAL_TABLET | Freq: Every day | ORAL | Status: DC | PRN

## 2022-05-21 NOTE — Progress Notes (Signed)
Taylor Johnson at [place] NAME: Taylor Johnson    MR#:  [id number]  DATE OF BIRTH:  1945-08-06  SUBJECTIVE:   Patient's sister at bedside. Discussed importance of PT  VITALS:  Blood pressure (!) 141/65, pulse 64, temperature 98.7 F (37.1 C), resp. rate 17, height '5\' 4"'$  (1.626 m), weight 93.1 kg, SpO2 97 %.  PHYSICAL EXAMINATION:   GENERAL:  76 y.o.-year-old patient lying in the bed with no acute distress. Morbid obesity LUNGS: Normal breath sounds bilaterally, no wheezing CARDIOVASCULAR: S1, S2 normal. No murmurs,   EXTREMITIES: chronic lower extremity edema   1+ NEUROLOGIC: nonfocal  patient is alert and awake SKIN: No obvious rash, lesion, or ulcer.   LABORATORY PANEL:  CBC Recent Labs  Lab 05/18/22 0455  WBC 7.4  HGB 10.3*  HCT 34.9*  PLT 450*     Chemistries  Recent Labs  Lab 05/18/22 0455  NA 138  K 4.3  CL 97*  CO2 34*  GLUCOSE 118*  BUN 33*  CREATININE 1.08*  CALCIUM 9.9  MG 2.4    Assessment and Plan Taylor Johnson is a 76 y.o. female with medical history significant of recently discovered mediastinal mass, hypertension, OSA on CPAP, developmental delay who presents to the ED with complaints of leg swelling interfering with her walking.   Acute respiratory failure with hypoxia [facility]) Patient presenting with 3-day history of worsening shortness of breath and lower extremity edema.  BNP only mildly elevated at 134.  CXR no acute finding.  Pt was found to desat to 87% on room air while at rest, unclear etiology.  No hx of asthma or COPD. --repeat Echo during this hospitalization with again normal LVEF, and showed again small to mod pericardial effusion. --Cr and BUN both increased with just IV lasix 40 mg x1 on admission, but subsequently tolerated it with stable kidney function --weaned down to room air on 10/30 --change to po lasix --now d/ced (not on at home) --cont incentive spirometer. --resp failure resolved    Pericardial effusion Patient presented with chest pain on 10/18 and found to have a mediastinal mass with pericardial effusion.  --current Echo showed the same small to mod pericardial effusion   Generalized weakness Patient complaining of lower extremity weakness due to edema, however on my examination she has global weakness in all 4 extremities.  I suspect this is due to deconditioning given recent hospitalization and sedentary lifestyle since being home.  Low suspicion for CVA given lack of focal findings. -PT/OT --SNF rehab   Mediastinal mass - Oncology consulted on previous admission with outpatient follow-up established - Pathology obtained   OSA on CPAP - CPAP at nighttime   Benign essential HTN - hold oral hydralazine due to low normal BP   Obesity with body mass index (BMI) of 30.0 to 39.9 BMI:  35.19   Pain in both feet --Pain with light palpation of bottom of feet and dorsiflexion of feet.  Foot pain limiting pt's mobility. --possibly due to prolonged immobility and development of contracture --encourage ROM of both feet --encourage family to give pt foot massages   Swelling of both lower extremities --reportedly causing pain and inability to walk --swelling improved with lasix--d/c now can use prn lasix   AKI (acute kidney injury) ([place]) --Cr increased from 1.01 to 1.35 after IV lasix 40 x1 dose.  AKI resolved.     DVT prophylaxis: Lovenox SQ Code Status: Full code  Family Communication: sister at  bedside Level of care: Med-Surg Dispo:   The patient is from: home Anticipated d/c is to: SNF rehab Anticipated d/c date is: whenever Passar and insurance auth available        TOTAL TIME TAKING CARE OF THIS PATIENT: 25 minutes.  >50% time spent on counselling and coordination of care  Note: This dictation was prepared with Dragon dictation along with smaller phrase technology. Any transcriptional errors that result from this process are  unintentional.  Fritzi Mandes M.D    [facility]   CC: Primary care physician; [name], MD

## 2022-05-22 DIAGNOSIS — J9601 Acute respiratory failure with hypoxia: Secondary | ICD-10-CM | POA: Diagnosis not present

## 2022-05-22 NOTE — Progress Notes (Signed)
Physical Therapy Treatment Patient Details Name: Taylor Johnson MRN: [id number] DOB: 28-Apr-1946 Today's Date: 05/22/2022   History of Present Illns 76 yo female pt presents with bilateral LE swelling/ weakness and SOB  s/p acute respiratory failure and hypoxia. PMH includes developmental delay, plural effusion, pericadial effusion, SOB, and Mediastinal Mass bipopsy (pending results)    PT Comments    Pt in bed.  Avoids eye contact during session.  She is able to get to EOB with min a x 1.  Mostly for verbal and tactile cues to initiate movement.  Once sitting she says "I can't" and tries to lay back down.  Verbal and tactile cues to remain upright.  She is steady x 20 minutes sitting EOB with occasional LUE propping.  She is encouraged to try to stand during that time and +2 is available.  She continues to say "I can't" and when prompted to try she makes no movement to try to lean forward or engage quads/glutes to stand.   She is given several attempts but further attempts deferred for pt and staff safety.  She is able to make some lateral scoot movements to move towards HOB in sitting but needs much encouragement to attempt and is only able to move a few inches at most.  She is able to return to supine with verbal cues but is able to lift her legs on her own without assist and is able to pull herself up in bed in supine with cues and assist to keep legs bent to help.    Pt does seem to have the physical strength to attempt task.  Developmental delay may be more of a barrier than physical at this time.  Will continue to encourage mobility but pt may need to be Hoyer lift  OOB at this time for pt and staff safety.  She would benefit from continues therapy at SNF as she was walking 10 days ago without assist.   Recommendations for follow up therapy are one component of a multi-disciplinary discharge planning process, led by the attending physician.  Recommendations may be updated based on patient status,  additional functional criteria and insurance authorization.  Follow Up Recommendations  Skilled nursing-short term rehab (<3 hours/day)     Assistance Recommended at Discharge Frequent or constant Supervision/Assistance  Patient can return home with the following Direct supervision/assist for medications management;Direct supervision/assist for financial management;Two people to help with bathing/dressing/bathroom;Two people to help with walking and/or transfers;Assistance with cooking/housework;Help with stairs or ramp for entrance;Assist for transportation   Equipment Recommendations       Recommendations for Other Services       Precautions / Restrictions Precautions Precautions: Fall Restrictions Weight Bearing Restrictions: No     Mobility  Bed Mobility Overal bed mobility: Needs Assistance Bed Mobility: Supine to Sit, Sit to Supine     Supine to sit: Min assist Sit to supine: Min guard   General bed mobility comments: VC sequencing, hand over hand for bed rail use    Transfers Overall transfer level: Needs assistance Equipment used: Rolling walker (2 wheels) Transfers: Sit to/from Stand Sit to Stand: Total assist           General transfer comment: no physical effort or engagement from pt to try to assst    Ambulation/Gait                   Stairs             Wheelchair Mobility  Modified Rankin (Stroke Patients Only)       Balance Overall balance assessment: Needs assistance Sitting-balance support: Feet supported Sitting balance-Leahy Scale: Good Sitting balance - Comments: no  LOB sitting x 20 minutes - does use L hand at times to prop herself up                                    Cognition Arousal/Alertness: Awake/alert Behavior During Therapy: Flat affect Overall Cognitive Status: History of cognitive impairments - at baseline                                          Exercises       General Comments        Pertinent Vitals/Pain Pain Assessment Pain Assessment: No/denies pain    Home Living                          Prior Function            PT Goals (current goals can now be found in the care plan section) Progress towards PT goals: Progressing toward goals    Frequency    Min 2X/week      PT Plan Current plan remains appropriate    Co-evaluation              AM-PAC PT "6 Clicks" Mobility   Outcome Measure  Help needed turning from your back to your side while in a flat bed without using bedrails?: A Little Help needed moving from lying on your back to sitting on the side of a flat bed without using bedrails?: A Little Help needed moving to and from a bed to a chair (including a wheelchair)?: Total Help needed standing up from a chair using your arms (e.g., wheelchair or bedside chair)?: Total Help needed to walk in hospital room?: Total Help needed climbing 3-5 steps with a railing? : Total 6 Click Score: 10    End of Session Equipment Utilized During Treatment: Gait belt Activity Tolerance: Patient tolerated treatment well Patient left: in bed;with call bell/phone within reach;with bed alarm set;with family/visitor present Nurse Communication: Mobility status PT Visit Diagnosis: Other abnormalities of gait and mobility (R26.89);Unsteadiness on feet (R26.81);Muscle weakness (generalized) (M62.81);Difficulty in walking, not elsewhere classified (R26.2)     Time: 1245-8099 PT Time Calculation (min) (ACUTE ONLY): 27 min  Charges:  $Therapeutic Activity: 23-37 mins                   Chesley Noon, PTA 05/22/22, 12:53 PM

## 2022-05-22 NOTE — Progress Notes (Signed)
[place] at [place] NAME: Taylor Johnson    MR#:  [id number]  DATE OF BIRTH:  [birth year]  SUBJECTIVE:   Patient's sister at bedside. Discussed importance of PT and she is agreeable to work today--PT informed  VITALS:  Blood pressure (!) 120/50, pulse 71, temperature 98.3 F (36.8 C), resp. rate 18, height '5\' 4"'$  (1.626 m), weight 93.1 kg, SpO2 94 %.  PHYSICAL EXAMINATION:   GENERAL:  76 y.o.-year-old patient lying in the bed with no acute distress. Morbid obesity LUNGS: Normal breath sounds bilaterally, no wheezing CARDIOVASCULAR: S1, S2 normal. No murmurs,   EXTREMITIES: chronic lower extremity edema   1+ NEUROLOGIC: nonfocal  patient is alert and awake SKIN: No obvious rash, lesion, or ulcer.   LABORATORY PANEL:  CBC Recent Labs  Lab 05/18/22 0455  WBC 7.4  HGB 10.3*  HCT 34.9*  PLT 450*     Chemistries  Recent Labs  Lab 05/18/22 0455  NA 138  K 4.3  CL 97*  CO2 34*  GLUCOSE 118*  BUN 33*  CREATININE 1.08*  CALCIUM 9.9  MG 2.4    Assessment and Plan Taylor Johnson is a 76 y.o. female with medical history significant of recently discovered mediastinal mass, hypertension, OSA on CPAP, developmental delay who presents to the ED with complaints of leg swelling interfering with her walking.   Acute respiratory failure with hypoxia [facility]) Patient presenting with 3-day history of worsening shortness of breath and lower extremity edema.  BNP only mildly elevated at 134.  CXR no acute finding.  Pt was found to desat to 87% on room air while at rest, unclear etiology.  No hx of asthma or COPD. --repeat Echo during this hospitalization with again normal LVEF, and showed again small to mod pericardial effusion. --Cr and BUN both increased with just IV lasix 40 mg x1 on admission, but subsequently tolerated it with stable kidney function --weaned down to room air on 10/30 --change to po lasix --now d/ced (not on at home) --cont  incentive spirometer. --resp failure resolved   Pericardial effusion Patient presented with chest pain on 10/18 and found to have a mediastinal mass with pericardial effusion.  --current Echo showed the same small to mod pericardial effusion   Generalized weakness Patient complaining of lower extremity weakness due to edema, however on my examination she has global weakness in all 4 extremities.  I suspect this is due to deconditioning given recent hospitalization and sedentary lifestyle since being home.  Low suspicion for CVA given lack of focal findings. -PT/OT recsSNF rehab   Mediastinal mass - Oncology consulted on previous admission with outpatient follow-up established - Pathology obtained   OSA on CPAP - CPAP at nighttime   Benign essential HTN - hold oral hydralazine due to low normal BP   Obesity with body mass index (BMI) of 30.0 to 39.9 BMI:  35.19   Pain in both feet --Pain with light palpation of bottom of feet and dorsiflexion of feet.  Foot pain limiting pt's mobility. --possibly due to prolonged immobility and development of contracture --encourage ROM of both feet --encourage family to give pt foot massages   Swelling of both lower extremities --reportedly causing pain and inability to walk --swelling improved with lasix--d/c now can use prn lasix   AKI (acute kidney injury) ([place]) --Cr increased from 1.01 to 1.35 after IV lasix 40 x1 dose.  AKI resolved.     DVT prophylaxis: Lovenox SQ Code  Status: Full code  Family Communication: sister at bedside Level of care: Med-Surg Dispo:   The patient is from: home Anticipated d/c is to: SNF rehab Anticipated d/c date is: whenever Passar and insurance auth available        TOTAL TIME TAKING CARE OF THIS PATIENT: 25 minutes.  >50% time spent on counselling and coordination of care  Note: This dictation was prepared with Dragon dictation along with smaller phrase technology. Any transcriptional errors  that result from this process are unintentional.  Fritzi Mandes M.D    [facility]   CC: Primary care physician; [name], MD

## 2022-05-23 DIAGNOSIS — J9601 Acute respiratory failure with hypoxia: Secondary | ICD-10-CM | POA: Diagnosis not present

## 2022-05-23 NOTE — Progress Notes (Signed)
[place] at [place] NAME: Taylor Johnson    MR#:  [id number]  DATE OF BIRTH:  04/18/1946  SUBJECTIVE:   Patient's sister at bedside.pt has not been working much with PT VITALS:  Blood pressure (!) 141/65, pulse (!) 58, temperature 98.1 F (36.7 C), resp. rate 16, height '5\' 4"'$  (1.626 m), weight 92.6 kg, SpO2 100 %.  PHYSICAL EXAMINATION:   GENERAL:  76 y.o.-year-old patient lying in the bed with no acute distress. Morbid obesity LUNGS: Normal breath sounds bilaterally, no wheezing CARDIOVASCULAR: S1, S2 normal. No murmurs,   EXTREMITIES: chronic lower extremity edema   1+ NEUROLOGIC: nonfocal  patient is alert and awake  LABORATORY PANEL:  CBC Recent Labs  Lab 05/18/22 0455  WBC 7.4  HGB 10.3*  HCT 34.9*  PLT 450*     Chemistries  Recent Labs  Lab 05/18/22 0455  NA 138  K 4.3  CL 97*  CO2 34*  GLUCOSE 118*  BUN 33*  CREATININE 1.08*  CALCIUM 9.9  MG 2.4    Assessment and Plan Taylor Johnson is a 76 y.o. female with medical history significant of recently discovered mediastinal mass, hypertension, OSA on CPAP, developmental delay who presents to the ED with complaints of leg swelling interfering with her walking.   Acute respiratory failure with hypoxia [facility]) Patient presenting with 3-day history of worsening shortness of breath and lower extremity edema.  BNP only mildly elevated at 134.  CXR no acute finding.  Pt was found to desat to 87% on room air while at rest, unclear etiology.  No hx of asthma or COPD. --repeat Echo during this hospitalization with again normal LVEF, and showed again small to mod pericardial effusion. --Cr and BUN both increased with just IV lasix 40 mg x1 on admission, but subsequently tolerated it with stable kidney function --weaned down to room air on 10/30 --change to po lasix --now d/ced (not on at home) --cont incentive spirometer. --resp failure resolved   Pericardial effusion Patient  presented with chest pain on 10/18 and found to have a mediastinal mass with pericardial effusion.  --current Echo showed the same small to mod pericardial effusion   Generalized weakness -PT/OT recsSNF rehab   Mediastinal mass - Oncology consulted on previous admission with outpatient follow-up established - s/p biopsy--to follow Dr Rogue Bussing   OSA on CPAP - CPAP at nighttime   Benign essential HTN - hold oral hydralazine due to low normal BP   Obesity with body mass index (BMI) of 30.0 to 39.9 BMI:  35.19   Swelling of both lower extremities --reportedly causing pain and inability to walk --swelling improved with lasix--d/c now can use prn lasix   AKI (acute kidney injury) ([place]) --Cr increased from 1.01 to 1.35 after IV lasix 40 x1 dose.  AKI resolved.     DVT prophylaxis: Lovenox SQ Code Status: Full code  Family Communication: sister at bedside Level of care: Med-Surg Dispo:   The patient is from: home Anticipated d/c is to: SNF rehab Anticipated d/c date is: whenever Passar and insurance auth available    Patient is medically best at baseline for discharge.    TOTAL TIME TAKING CARE OF THIS PATIENT: 25 minutes.  >50% time spent on counselling and coordination of care  Note: This dictation was prepared with Dragon dictation along with smaller phrase technology. Any transcriptional errors that result from this process are unintentional.  Fritzi Mandes M.D    [facility]  CC: Primary care physician; [name], MD

## 2022-05-23 NOTE — Progress Notes (Signed)
Physical Therapy Treatment Patient Details Name: Taylor Johnson MRN: [id number] DOB: [birth year] Today's Date: 05/23/2022   History of Present Illness 76 yo female pt presents with bilateral LE swelling/ weakness and SOB  s/p acute respiratory failure and hypoxia. PMH includes developmental delay, plural effusion, pericadial effusion, SOB, and Mediastinal Mass bipopsy (pending results)    PT Comments    Pt's brother Gerald Stabs present for session to help encourage pt to functionally improve and identify why pt is self limiting herself. Attempted standing at EOB with assist from OT as well. Pt unable to raise buttocks off of raised bed and was resisting transition. When asked, pt just states she does not want to try. Unable to significantly progress pt functionally since admission. Continue to recommend SNF upon d/c.    Recommendations for follow up therapy are one component of a multi-disciplinary discharge planning process, led by the attending physician.  Recommendations may be updated based on patient status, additional functional criteria and insurance authorization.  Follow Up Recommendations  Skilled nursing-short term rehab (<3 hours/day) Can patient physically be transported by private vehicle: No   Assistance Recommended at Discharge Frequent or constant Supervision/Assistance  Patient can return home with the following Direct supervision/assist for medications management;Direct supervision/assist for financial management;Two people to help with bathing/dressing/bathroom;Two people to help with walking and/or transfers;Assistance with cooking/housework;Help with stairs or ramp for entrance;Assist for transportation   Equipment Recommendations  Other (comment) (TBD at next facility)    Recommendations for Other Services       Precautions / Restrictions Precautions Precautions: Fall Precaution Comments:  (Pt resists when staff try to help her stand) Restrictions Weight Bearing  Restrictions: No     Mobility  Bed Mobility Overal bed mobility: Needs Assistance Bed Mobility: Rolling, Supine to Sit, Sit to Supine Rolling: Mod assist, +2 for physical assistance Sidelying to sit: Max assist, +2 for physical assistance Supine to sit: Mod assist     General bed mobility comments: Pt is self limiting    Transfers Overall transfer level: Needs assistance Equipment used: Rolling walker (2 wheels) Transfers: Sit to/from Stand Sit to Stand: Max assist, +2 physical assistance           General transfer comment: does assist with standing today and is able to clear hips on 2/3 stands but never gets fully upright.    Ambulation/Gait               General Gait Details: Pt was walking without difficulty prior to admission   Stairs             Wheelchair Mobility    Modified Rankin (Stroke Patients Only)       Balance Overall balance assessment: Needs assistance Sitting-balance support: Feet supported Sitting balance-Leahy Scale: Good Sitting balance - Comments: no  LOB sitting x 6 minutes - does use L hand at times to prop herself up                                    Cognition Arousal/Alertness: Awake/alert Behavior During Therapy: Flat affect Overall Cognitive Status: History of cognitive impairments - at baseline                                 General Comments: pt followed simple commands with VC and increased processing time  Exercises      General Comments General comments (skin integrity, edema, etc.): Education provided at length regarding pt's need to start participating and helping staff with mobility with good understanding however will not assist      Pertinent Vitals/Pain Pain Assessment Pain Assessment: No/denies pain    Home Living                          Prior Function            PT Goals (current goals can now be found in the care plan section) Acute Rehab  PT Goals Patient Stated Goal: decreased pain, return home Progress towards PT goals: Progressing toward goals    Frequency    Min 2X/week      PT Plan Current plan remains appropriate    Co-evaluation PT/OT/SLP Co-Evaluation/Treatment: Yes Reason for Co-Treatment: For patient/therapist safety;To address functional/ADL transfers PT goals addressed during session: Mobility/safety with mobility        AM-PAC PT "6 Clicks" Mobility   Outcome Measure  Help needed turning from your back to your side while in a flat bed without using bedrails?: Total Help needed moving from lying on your back to sitting on the side of a flat bed without using bedrails?: Total Help needed moving to and from a bed to a chair (including a wheelchair)?: Total Help needed standing up from a chair using your arms (e.g., wheelchair or bedside chair)?: Total Help needed to walk in hospital room?: Total Help needed climbing 3-5 steps with a railing? : Total 6 Click Score: 6    End of Session Equipment Utilized During Treatment: Gait belt Activity Tolerance: Other (comment) (Poor tolerance and motivation for improving mobility) Patient left: in bed;with call bell/phone within reach;with bed alarm set;with family/visitor present Nurse Communication: Mobility status PT Visit Diagnosis: Other abnormalities of gait and mobility (R26.89);Unsteadiness on feet (R26.81);Muscle weakness (generalized) (M62.81);Difficulty in walking, not elsewhere classified (R26.2)     Time: 6381-7711 PT Time Calculation (min) (ACUTE ONLY): 23 min  Charges:  $Therapeutic Activity: 8-22 mins                    Mikel Cella, PTA    Taylor Johnson 05/23/2022, 3:49 PM

## 2022-05-23 NOTE — Care Management Important Message (Signed)
Important Message  Patient Details  Name: Taylor Johnson MRN: [id number] Date of Birth: Dec 13, 1945   Medicare Important Message Given:  Yes     [name] 05/23/2022, 11:34 AM

## 2022-05-23 NOTE — TOC Progression Note (Addendum)
Transition of Care [facility]) - Progression Note    Patient Details  Name: Taylor Johnson MRN: [id number] Date of Birth: 09-19-1945  Transition of Care [facility]) CM/SW [place], [place] Phone Number: 05/23/2022, 2:06 PM  Clinical Narrative:      TOC spoke to the patient's family and she currently lives with her niece. TOC informed patient and family that we are working on Ship [employer] with [employer]. TOC reaching out to PT regarding insurance authorization questions.  1448: TOC submitted authorization for SNF. Authorization is pending on [facility] portal.      Expected Discharge Plan and Services        SNF                                         Social Determinants of Health (SDOH) Interventions    Readmission Risk Interventions    05/05/2022    3:40 PM  Readmission Risk Prevention Plan  Transportation Screening Complete  HRI or [place] Complete  Social Work Consult for [place] Planning/Counseling Complete  Palliative Care Screening Not Applicable  Medication Review Press photographer) Complete

## 2022-05-23 NOTE — Progress Notes (Signed)
Occupational Therapy Treatment Patient Details Name: Taylor Johnson MRN: [id number] DOB: 12-15-1945 Today's Date: 05/23/2022   History of present illness 75 yo female pt presents with bilateral LE swelling/ weakness and SOB  s/p acute respiratory failure and hypoxia. PMH includes developmental delay, plural effusion, pericadial effusion, SOB, and Mediastinal Mass bipopsy (pending results)   OT comments  Patient in bed supine upon arrival and brother present during session to assist in encouraging patient to functionally participate in therapy. Patient required mod-max A  for bed mobility. Patient required max encouragement for transfers sit<>stand x3 with max A +2. Patient unable to come into full standing (posterior lean), with manual facilitation. Patient stated "I don't feel like standing" even with max encouragement from therapist and family. Patient left in bed with call bell in reach, bed alarm set, and all needs met.    Recommendations for follow up therapy are one component of a multi-disciplinary discharge planning process, led by the attending physician.  Recommendations may be updated based on patient status, additional functional criteria and insurance authorization.    Follow Up Recommendations  Skilled nursing-short term rehab (<3 hours/day)    Assistance Recommended at Discharge Frequent or constant Supervision/Assistance  Patient can return home with the following  Two people to help with walking and/or transfers;Direct supervision/assist for financial management;Help with stairs or ramp for entrance;Direct supervision/assist for medications management;Assist for transportation;A lot of help with bathing/dressing/bathroom;Assistance with cooking/housework   Equipment Recommendations  Other (comment) (Defer to next venue of care)       Precautions / Restrictions Precautions Precautions: Fall Precaution Comments:  (Pt resists when staff try to help her  stand) Restrictions Weight Bearing Restrictions: No       Mobility Bed Mobility Overal bed mobility: Needs Assistance Bed Mobility: Supine to Sit, Sit to Supine Rolling: Mod assist Sidelying to sit: Max assist, +2 for physical assistance   Sit to supine: Mod assist        Transfers Overall transfer level: Needs assistance Equipment used: Rolling walker (2 wheels) Transfers: Sit to/from Stand Sit to Stand: Max assist, +2 physical assistance                 Balance Overall balance assessment: Needs assistance Sitting-balance support: Feet supported Sitting balance-Leahy Scale: Good Sitting balance - Comments: no  LOB sitting x 6 minutes - does use L hand at times to prop herself up   Standing balance support: Bilateral upper extremity supported Standing balance-Leahy Scale: Zero                             ADL either performed or assessed with clinical judgement    Extremity/Trunk Assessment Upper Extremity Assessment Upper Extremity Assessment: Generalized weakness   Lower Extremity Assessment Lower Extremity Assessment: Generalized weakness        Vision Patient Visual Report: No change from baseline            Cognition Arousal/Alertness: Awake/alert Behavior During Therapy: Flat affect Overall Cognitive Status: History of cognitive impairments - at baseline                                 General Comments: pt followed simple commands with VC and increased processing time              General Comments Education provided at length regarding pt's need to start participating and helping  staff with mobility with good understanding however will not assist    Pertinent Vitals/ Pain       Pain Assessment Pain Assessment: No/denies pain   Frequency  Min 2X/week        Progress Toward Goals  OT Goals(current goals can now be found in the care plan section)     Acute Rehab OT Goals Patient Stated Goal: get  better OT Goal Formulation: With patient/family Time For Goal Achievement: 05/25/22 Potential to Achieve Goals: [place] Discharge plan remains appropriate;Frequency remains appropriate    Co-evaluation    PT/OT/SLP Co-Evaluation/Treatment: Yes Reason for Co-Treatment: For patient/therapist safety;To address functional/ADL transfers PT goals addressed during session: Mobility/safety with mobility OT goals addressed during session: ADL's and self-care      AM-PAC OT "6 Clicks" Daily Activity     Outcome Measure   Help from another person eating meals?: None Help from another person taking care of personal grooming?: A Little Help from another person toileting, which includes using toliet, bedpan, or urinal?: Total Help from another person bathing (including washing, rinsing, drying)?: A Lot Help from another person to put on and taking off regular upper body clothing?: A Little Help from another person to put on and taking off regular lower body clothing?: Total 6 Click Score: 14    End of Session Equipment Utilized During Treatment: Gait belt;Rolling walker (2 wheels)  OT Visit Diagnosis: Unsteadiness on feet (R26.81);Muscle weakness (generalized) (M62.81)   Activity Tolerance Patient tolerated treatment well   Patient Left in bed;with call bell/phone within reach;with bed alarm set   Nurse Communication Mobility status        Time: 1700-1749 OT Time Calculation (min): 23 min  Charges: OT General Charges $OT Visit: 1 Visit OT Treatments $Therapeutic Activity: 8-22 mins    Mignon Bechler, OTS 05/23/2022, 4:19 PM

## 2022-05-23 NOTE — Plan of Care (Signed)
  Problem: Clinical Measurements: Goal: Will remain free from infection Outcome: Progressing   Problem: Clinical Measurements: Goal: Diagnostic test results will improve Outcome: Progressing   Problem: Clinical Measurements: Goal: Respiratory complications will improve Outcome: Progressing   Problem: Activity: Goal: Risk for activity intolerance will decrease Outcome: Progressing   Problem: Nutrition: Goal: Adequate nutrition will be maintained Outcome: Progressing   

## 2022-05-23 NOTE — Progress Notes (Signed)
Physical Therapy Treatment Patient Details Name: Taylor Johnson MRN: [id number] DOB: September 07, 1945 Today's Date: 05/23/2022   History of Present Illness 76 yo female pt presents with bilateral LE swelling/ weakness and SOB  s/p acute respiratory failure and hypoxia. PMH includes developmental delay, plural effusion, pericadial effusion, SOB, and Mediastinal Mass bipopsy (pending results)    PT Comments    Pt in chair via lift with nursing staff this am.  She is inc of BM in chair and tech in need of assist.  Viking lift back to bed and pt rolling left/right for care.  Once finished she is able to transition to EOB with mod tactile cues and light assist. Steady once sitting.  She is able to remain sitting without assist again today and does not prop herself up with hands like yesterday.  She is able to stand 2/3 attempts and does clear hips today but does not stand fully upright.  She does however put good effort in today for standing.  Returns to supine with min guard and encouragement and is able to assist with scooting up in bed in supine using arms to pull and feet to push once they are positioned for her.  Overall improvement in session today but remains far from baseline.  SNF remains appropriate for transition.   Recommendations for follow up therapy are one component of a multi-disciplinary discharge planning process, led by the attending physician.  Recommendations may be updated based on patient status, additional functional criteria and insurance authorization.  Follow Up Recommendations  Skilled nursing-short term rehab (<3 hours/day)     Assistance Recommended at Discharge Frequent or constant Supervision/Assistance  Patient can return home with the following Direct supervision/assist for medications management;Direct supervision/assist for financial management;Two people to help with bathing/dressing/bathroom;Two people to help with walking and/or transfers;Assistance with  cooking/housework;Help with stairs or ramp for entrance;Assist for transportation   Equipment Recommendations       Recommendations for Other Services       Precautions / Restrictions Precautions Precautions: Fall Restrictions Weight Bearing Restrictions: No     Mobility  Bed Mobility Overal bed mobility: Needs Assistance Bed Mobility: Rolling, Supine to Sit, Sit to Supine Rolling: Mod assist, +2 for physical assistance   Supine to sit: Min assist, Mod assist Sit to supine: Min guard   General bed mobility comments: VC sequencing, hand over hand for bed rail use  mostly tactile cues for assist for supine to sit today.    Transfers Overall transfer level: Needs assistance Equipment used: Rolling walker (2 wheels) Transfers: Sit to/from Stand, Bed to chair/wheelchair/BSC Sit to Stand: Mod assist, Max assist, +2 physical assistance          Lateral/Scoot Transfers: +2 physical assistance, Max assist General transfer comment: does assist with standing today and is able to clear hips on 2/3 stands but never gets fully upright.    Ambulation/Gait               General Gait Details: Pt was walking without difficulty prior to admission   Stairs             Wheelchair Mobility    Modified Rankin (Stroke Patients Only)       Balance Overall balance assessment: Needs assistance Sitting-balance support: Feet supported Sitting balance-Leahy Scale: Good     Standing balance support: Bilateral upper extremity supported Standing balance-Leahy Scale: Zero Standing balance comment: stands 2/3 attempts but never gets fully upright.  Cognition Arousal/Alertness: Awake/alert Behavior During Therapy: Flat affect Overall Cognitive Status: History of cognitive impairments - at baseline                                 General Comments: pt followed simple commands with VC and increased processing time         Exercises      General Comments        Pertinent Vitals/Pain Pain Assessment Pain Assessment: No/denies pain    Home Living                          Prior Function            PT Goals (current goals can now be found in the care plan section) Progress towards PT goals: Progressing toward goals    Frequency    Min 2X/week      PT Plan Current plan remains appropriate    Co-evaluation              AM-PAC PT "6 Clicks" Mobility   Outcome Measure  Help needed turning from your back to your side while in a flat bed without using bedrails?: A Little Help needed moving from lying on your back to sitting on the side of a flat bed without using bedrails?: A Little Help needed moving to and from a bed to a chair (including a wheelchair)?: Total Help needed standing up from a chair using your arms (e.g., wheelchair or bedside chair)?: Total Help needed to walk in hospital room?: Total Help needed climbing 3-5 steps with a railing? : Total 6 Click Score: 10    End of Session Equipment Utilized During Treatment: Gait belt Activity Tolerance: Patient tolerated treatment well Patient left: in bed;with call bell/phone within reach;with bed alarm set;with family/visitor present Nurse Communication: Mobility status PT Visit Diagnosis: Other abnormalities of gait and mobility (R26.89);Unsteadiness on feet (R26.81);Muscle weakness (generalized) (M62.81);Difficulty in walking, not elsewhere classified (R26.2)     Time: 0223-3612 PT Time Calculation (min) (ACUTE ONLY): 25 min  Charges:  $Therapeutic Activity: 23-37 mins                   Chesley Noon, PTA 05/23/22, 12:17 PM

## 2022-05-24 DIAGNOSIS — I3139 Other pericardial effusion (noninflammatory): Secondary | ICD-10-CM | POA: Diagnosis not present

## 2022-05-24 DIAGNOSIS — Z7401 Bed confinement status: Secondary | ICD-10-CM | POA: Diagnosis not present

## 2022-05-24 DIAGNOSIS — R1312 Dysphagia, oropharyngeal phase: Secondary | ICD-10-CM | POA: Diagnosis not present

## 2022-05-24 DIAGNOSIS — F01518 Vascular dementia, unspecified severity, with other behavioral disturbance: Secondary | ICD-10-CM | POA: Diagnosis not present

## 2022-05-24 DIAGNOSIS — J9859 Other diseases of mediastinum, not elsewhere classified: Secondary | ICD-10-CM | POA: Diagnosis not present

## 2022-05-24 DIAGNOSIS — I5032 Chronic diastolic (congestive) heart failure: Secondary | ICD-10-CM | POA: Diagnosis not present

## 2022-05-24 DIAGNOSIS — Z743 Need for continuous supervision: Secondary | ICD-10-CM | POA: Diagnosis not present

## 2022-05-24 DIAGNOSIS — K219 Gastro-esophageal reflux disease without esophagitis: Secondary | ICD-10-CM | POA: Diagnosis not present

## 2022-05-24 DIAGNOSIS — R601 Generalized edema: Secondary | ICD-10-CM | POA: Diagnosis not present

## 2022-05-24 DIAGNOSIS — K5900 Constipation, unspecified: Secondary | ICD-10-CM | POA: Diagnosis not present

## 2022-05-24 DIAGNOSIS — J9601 Acute respiratory failure with hypoxia: Secondary | ICD-10-CM | POA: Diagnosis not present

## 2022-05-24 DIAGNOSIS — R222 Localized swelling, mass and lump, trunk: Secondary | ICD-10-CM | POA: Diagnosis not present

## 2022-05-24 DIAGNOSIS — J9690 Respiratory failure, unspecified, unspecified whether with hypoxia or hypercapnia: Secondary | ICD-10-CM | POA: Diagnosis not present

## 2022-05-24 DIAGNOSIS — Z79899 Other long term (current) drug therapy: Secondary | ICD-10-CM | POA: Diagnosis not present

## 2022-05-24 DIAGNOSIS — K314 Gastric diverticulum: Secondary | ICD-10-CM | POA: Diagnosis not present

## 2022-05-24 DIAGNOSIS — I340 Nonrheumatic mitral (valve) insufficiency: Secondary | ICD-10-CM | POA: Diagnosis not present

## 2022-05-24 DIAGNOSIS — I10 Essential (primary) hypertension: Secondary | ICD-10-CM | POA: Diagnosis not present

## 2022-05-24 DIAGNOSIS — R6889 Other general symptoms and signs: Secondary | ICD-10-CM | POA: Diagnosis not present

## 2022-05-24 DIAGNOSIS — G4733 Obstructive sleep apnea (adult) (pediatric): Secondary | ICD-10-CM | POA: Diagnosis not present

## 2022-05-24 DIAGNOSIS — M6259 Muscle wasting and atrophy, not elsewhere classified, multiple sites: Secondary | ICD-10-CM | POA: Diagnosis not present

## 2022-05-24 DIAGNOSIS — M1000 Idiopathic gout, unspecified site: Secondary | ICD-10-CM | POA: Diagnosis not present

## 2022-05-24 DIAGNOSIS — R600 Localized edema: Secondary | ICD-10-CM | POA: Diagnosis not present

## 2022-05-24 DIAGNOSIS — Z741 Need for assistance with personal care: Secondary | ICD-10-CM | POA: Diagnosis not present

## 2022-05-24 DIAGNOSIS — R109 Unspecified abdominal pain: Secondary | ICD-10-CM | POA: Diagnosis not present

## 2022-05-24 MED ORDER — FUROSEMIDE 20 MG PO TABS: 20.0000 mg | ORAL_TABLET | Freq: Every day | ORAL | 0 refills | Status: AC | PRN

## 2022-05-24 NOTE — TOC Transition Note (Signed)
Transition of Care [facility]) - CM/SW Discharge Note   Patient Details  Name: Taylor Johnson MRN: [id number] Date of Birth: August 26, 1945  Transition of Care [facility]) CM/SW Contact:  Colen Darling, [place] Phone Number: 05/24/2022, 2:06 PM   Clinical Narrative:     [facility] informed family member [name] [phone] that the patient will transport to facility today. RN provided the number for report. DC summary sent to SNF. Patient has a room assignment at [facility].  Final next level of care: Skilled Nursing Facility Barriers to Discharge: Barriers Resolved   Patient Goals and CMS Choice Patient states their goals for this hospitalization and ongoing recovery are:: attend SNF CMS Medicare.gov Compare Post Acute Care list provided to:: Patient Represenative (must comment) [name])    Discharge Placement PASRR number recieved: 05/24/22 Existing PASRR number confirmed : 05/24/22            Patient to be transferred to facility by: [place] Name of family member notified: [name] [phone] Patient and family notified of of transfer: 05/24/22  Discharge Plan and Services                   [facility]                  Social Determinants of Health (SDOH) Interventions     Readmission Risk Interventions    05/05/2022    3:40 PM  Readmission Risk Prevention Plan  Transportation Screening Complete  HRI or [place] Complete  Social Work Consult for [place] Planning/Counseling Complete  Palliative Care Screening Not Applicable  Medication Review Press photographer) Complete

## 2022-05-24 NOTE — TOC Progression Note (Addendum)
Transition of Care [facility]) - Progression Note    Patient Details  Name: Taylor Johnson MRN: [id number] Date of Birth: 11-12-1945  Transition of Care [facility]) CM/SW [place], [place] Phone Number: 05/24/2022, 11:29 AM  Clinical Narrative:     TOC sent authorization number to SNF. Auth approved: [id number]. Patient will discharge to [facility]. TOC informing provider and arranging discharge.  1322: TOC called the SNF and they will call back with updates regarding discharge. TOC is inquiring on the room assignment.       Expected Discharge Plan and Services                            [facility]                     Social Determinants of Health (SDOH) Interventions    Readmission Risk Interventions    05/05/2022    3:40 PM  Readmission Risk Prevention Plan  Transportation Screening Complete  HRI or [place] Complete  Social Work Consult for [place] Planning/Counseling Complete  Palliative Care Screening Not Applicable  Medication Review Press photographer) Complete

## 2022-05-24 NOTE — Progress Notes (Signed)
   05/24/22 1318  Medical Necessity for Transport Certificate --- IF THIS TRANSPORT IS ROUND TRIP OR SCHEDULED AND REPEATED, A PHYSICIAN MUST COMPLETE THIS FORM  Date of Transport Service 05/24/22  Name of [facility]  Round Trip Transport? No  Reason for Transport Discharge  Is this a hospice patient? No  Describe the Medical Condition acute respiratory failure with hypoxia, pericardial effusion, weakness, OSA on CPAP, obesity, AKI  Q1 Are ALL the following "true"? 1. Patient unable to get up from bed without assistance  AND  2. Unable to ambulate  AND  3. Unable to sit in a chair, including wheelchair. Yes  Q2 Could the patient be transported safely by other means of transportation (I.E., wheelchair van)? No  Q3 Please check any of the following conditions that apply at the time of transport: Risk of injury to self and/or others;Obesity;Comatose and requires monitoring;Other (Comment) (OSA on CPAP, AKI, pericardial effusion, respiratory failure)  Electronic Signature Colen Darling  Credentials DP  Date Signed 05/24/22

## 2022-05-24 NOTE — Discharge Summary (Addendum)
Physician Discharge Summary   Patient: Taylor Johnson MRN: [id number] DOB: January 31, 1946  Admit date:     05/10/2022  Discharge date: 05/24/22  Discharge Physician: Fritzi Mandes   PCP: [name], MD   Recommendations at discharge:    F/u PCP in 1-2 weeks  Discharge Diagnoses: Principal Problem:   Acute respiratory failure with hypoxia [facility]) Active Problems:   Pericardial effusion   Generalized weakness   Mediastinal mass   OSA on CPAP   Benign essential HTN   Obesity with body mass index (BMI) of 30.0 to 39.9   Weakness   AKI (acute kidney injury) ([place])   Swelling of both lower extremities   Pain in both feet   Hospital Course: Taylor Johnson is a 76 y.o. female with medical history significant of recently discovered mediastinal mass, hypertension, OSA on CPAP, developmental delay who presents to the ED with complaints of leg swelling interfering with her walking.   Acute respiratory failure with hypoxia [facility]) Patient presenting with 3-day history of worsening shortness of breath and lower extremity edema.  BNP only mildly elevated at 134.  CXR no acute finding.  Pt was found to desat to 87% on room air while at rest, unclear etiology.  No hx of asthma or COPD. --repeat Echo during this hospitalization with again normal LVEF, and showed again small to mod pericardial effusion. --Cr and BUN both increased with just IV lasix 40 mg x1 on admission, but subsequently tolerated it with stable kidney function --weaned down to room air on 10/30 --change to po lasix --now d/ced (not on at home) use prn for leg edema --cont incentive spirometer. --resp failure resolved   Pericardial effusion Patient presented with chest pain on 10/18 and found to have a mediastinal mass with pericardial effusion.  --current Echo showed the same small to mod pericardial effusion   Generalized weakness -PT/OT recsSNF rehab   Mediastinal mass - Oncology consulted on previous admission with  outpatient follow-up established - s/p biopsy--to follow Dr Rogue Bussing as out pt   OSA on CPAP - CPAP at nighttime   Benign essential HTN - hold oral hydralazine due to low normal BP   Obesity with body mass index (BMI) of 30.0 to 39.9 BMI:  35.19   Swelling of both lower extremities --reportedly causing pain and inability to walk --swelling improved with lasix--d/c now can use prn lasix   AKI (acute kidney injury) ([place]) --Cr increased from 1.01 to 1.35 after IV lasix 40 x1 dose.  AKI resolved.     DVT prophylaxis: Lovenox SQ Code Status: Full code  Family Communication: sister at bedside Level of care: Med-Surg Dispo:   The patient is from: home Anticipated d/c is to: SNF rehab today since insurance approval obtained      Disposition: Skilled nursing facility Diet recommendation:  Discharge Diet Orders (From admission, onward)     Start     Ordered   05/24/22 0000  Diet - low sodium heart healthy        05/24/22 1148           Cardiac diet DISCHARGE MEDICATION: Allergies as of 05/24/2022   No Known Allergies      Medication List     STOP taking these medications    amoxicillin-clavulanate 875-125 MG tablet Commonly known as: AUGMENTIN       TAKE these medications    furosemide 20 MG tablet Commonly known as: LASIX Take 1 tablet (20 mg total) by mouth daily  as needed for edema.   hydrALAZINE 25 MG tablet Commonly known as: APRESOLINE NEW PRESCRIPTION REQUEST:: TAKE ONE TABLET BY MOUTH THREE TIMES DAILY   meclizine 12.5 MG tablet Commonly known as: ANTIVERT TAKE 1 TABLET BY MOUTH 3 TIMES DAILY AS NEEDED FOR DIZZINESS   metoCLOPramide 5 MG tablet Commonly known as: REGLAN TAKE ONE TABLET BY MOUTH THREE TIMES A DAY   OLANZapine 2.5 MG tablet Commonly known as: ZYPREXA Take 1 tablet (2.5 mg total) by mouth at bedtime.   pantoprazole 40 MG tablet Commonly known as: Protonix Take 1 tablet (40 mg total) by mouth 2 (two) times daily.         Contact information for follow-up providers     [name], MD. Schedule an appointment as soon as possible for a visit in 1 week(s).   Specialty: Internal Medicine Contact information: [address] [place] [address] [phone]              Contact information for after-discharge care     Destination     [facility] .   Service: Skilled Nursing Contact information: [address] [place] [phone]                    Discharge Exam: Danley Danker Weights   05/21/22 0500 05/23/22 0500 05/24/22 0419  Weight: 93.1 kg 92.6 kg 91.7 kg     Condition at discharge: fair  The results of significant diagnostics from this hospitalization (including imaging, microbiology, ancillary and laboratory) are listed below for reference.   Imaging Studies: US Venous Img Lower Bilateral (DVT)  Result Date: 05/11/2022 CLINICAL DATA:  Bilateral lower extremity pain and edema. Evaluate for DVT. EXAM: BILATERAL LOWER EXTREMITY VENOUS DOPPLER ULTRASOUND TECHNIQUE: Gray-scale sonography with graded compression, as well as color Doppler and duplex ultrasound were performed to evaluate the lower extremity deep venous systems from the level of the common femoral vein and including the common femoral, femoral, profunda femoral, popliteal and calf veins including the posterior tibial, peroneal and gastrocnemius veins when visible. The superficial great saphenous vein was also interrogated. Spectral Doppler was utilized to evaluate flow at rest and with distal augmentation maneuvers in the common femoral, femoral and popliteal veins. COMPARISON:  None Available. FINDINGS: RIGHT LOWER EXTREMITY Common Femoral Vein: No evidence of thrombus. Normal compressibility, respiratory phasicity and response to augmentation. Saphenofemoral Junction: No evidence of thrombus. Normal compressibility and flow on color Doppler  imaging. Profunda Femoral Vein: No evidence of thrombus. Normal compressibility and flow on color Doppler imaging. Femoral Vein: No evidence of thrombus. Normal compressibility, respiratory phasicity and response to augmentation. Popliteal Vein: No evidence of thrombus. Normal compressibility, respiratory phasicity and response to augmentation. Calf Veins: No evidence of thrombus. Normal compressibility and flow on color Doppler imaging. Superficial Great Saphenous Vein: No evidence of thrombus. Normal compressibility. Other Findings:  None. LEFT LOWER EXTREMITY Common Femoral Vein: No evidence of thrombus. Normal compressibility, respiratory phasicity and response to augmentation. Saphenofemoral Junction: No evidence of thrombus. Normal compressibility and flow on color Doppler imaging. Profunda Femoral Vein: No evidence of thrombus. Normal compressibility and flow on color Doppler imaging. Femoral Vein: No evidence of thrombus. Normal compressibility, respiratory phasicity and response to augmentation. Popliteal Vein: No evidence of thrombus. Normal compressibility, respiratory phasicity and response to augmentation. Calf Veins: No evidence of thrombus. Normal compressibility and flow on color Doppler imaging. Superficial Great Saphenous Vein: No evidence of thrombus. Normal compressibility. Other  Findings: Note is made of an approximately 2.3 x 2.1 x 0.7 cm fluid collection within the left popliteal fossa compatible with a Baker's cyst IMPRESSION: 1. No evidence of DVT within either lower extremity. 2. Incidentally noted approximately 2.3 cm left-sided Baker's cyst. Electronically Signed   By: Sandi Mariscal M.D.   On: 05/11/2022 15:40   ECHOCARDIOGRAM LIMITED  Result Date: 05/11/2022    ECHOCARDIOGRAM LIMITED REPORT   Patient Name:   KYLII ENNIS [facility] Date of Exam: 05/10/2022 Medical Rec #:  [id number]     Height:       64.0 in Accession #:    [id number]    Weight:       213.8 lb Date of Birth:  [birth year]     BSA:           2.013 m Patient Age:    62 years      BP:           110/44 mmHg Patient Gender: F             HR:           83 bpm. Exam Location:  [facility] Procedure: 2D Echo, Limited Echo and Limited Color Doppler Indications:     I31.3 Pericardial Effusion  History:         Patient has prior history of Echocardiogram examinations, most                  recent 05/04/2022. Signs/Symptoms:Murmur; Risk                  Factors:Hypertension and Sleep Apnea.  Sonographer:     Cresenciano Lick RDCS Referring Phys:  [id number] Jose Persia Diagnosing Phys: Nelva Bush MD IMPRESSIONS  1. Left ventricular ejection fraction, by estimation, is 60 to 65%. The left ventricle has normal function. The left ventricle has no regional wall motion abnormalities. There is moderate left ventricular hypertrophy.  2. Right ventricular systolic function is normal. The right ventricular size is normal. Moderately increased right ventricular wall thickness.  3. There is a small to moderate pericardial effusion. The pericardial effusion is circumferential. There is no evidence of cardiac tamponade.  4. Trivial mitral valve regurgitation. No evidence of mitral stenosis.  5. Aortic valve regurgitation is not visualized. No aortic stenosis is present.  6. The inferior vena cava is normal in size with greater than 50% respiratory variability, suggesting right atrial pressure of 3 mmHg. Comparison(s): A prior study was performed on 05/04/2022. No significant change since prior study. FINDINGS  Left Ventricle: Left ventricular ejection fraction, by estimation, is 60 to 65%. The left ventricle has normal function. The left ventricle has no regional wall motion abnormalities. The left ventricular internal cavity size was normal in size. There is  moderate left ventricular hypertrophy. Right Ventricle: The right ventricular size is normal. Moderately increased right ventricular wall thickness. Right ventricular systolic function is normal.  Pericardium: There is a small to moderate pericardial effusion. The pericardial effusion is circumferential. There is no evidence of cardiac tamponade. Mitral Valve: There is mild thickening of the mitral valve leaflet(s). Trivial mitral valve regurgitation. No evidence of mitral valve stenosis. Tricuspid Valve: The tricuspid valve is normal in structure. Tricuspid valve regurgitation is mild. Aortic Valve: Aortic valve regurgitation is not visualized. No aortic stenosis is present. Aorta: The aortic root is normal in size and structure. Venous: The inferior vena cava is normal in size with greater than 50% respiratory variability, suggesting right atrial pressure of  3 mmHg. Additional Comments: There is a small pleural effusion in the left lateral region. LEFT VENTRICLE PLAX 2D LVIDd:         3.90 cm LVIDs:         2.40 cm LV PW:         1.40 cm LV IVS:        1.40 cm  LEFT ATRIUM         Index LA diam:    4.00 cm 1.99 cm/m   AORTA Ao Root diam: 3.30 cm Nelva Bush MD Electronically signed by Nelva Bush MD Signature Date/Time: 05/11/2022/7:00:46 AM    Final    DG Chest 2 View  Result Date: 05/10/2022 CLINICAL DATA:  Bilateral leg swelling.  Short of breath. EXAM: CHEST - 2 VIEW COMPARISON:  05/05/2022.  CT, 05/04/2022. FINDINGS: Stable enlargement of the cardiopericardial silhouette. No mediastinal or hilar masses. No evidence of adenopathy. Small left pleural effusion. Associated left lung base opacity consistent with atelectasis. Remainder of the lungs is clear. No pneumothorax. Skeletal structures are intact. IMPRESSION: 1. No acute cardiopulmonary disease. 2. Stable enlargement of the cardiopericardial silhouette. This was found to be in part due to a pericardial effusion on the prior CT. 3. Small left pleural effusion with associated basilar atelectasis similar to the previous exam. Electronically Signed   By: Lajean Manes M.D.   On: 05/10/2022 13:11   CT BIOPSY  Result Date:  05/06/2022 INDICATION: 76 year old female with anterior mediastinal mass EXAM: CT-guided biopsy MEDICATIONS: None. ANESTHESIA/SEDATION: Moderate (conscious) sedation was employed during this procedure. A total of Versed 1 mg and Fentanyl 50 mcg was administered intravenously by the radiology nurse. Total intra-service moderate Sedation Time: 16 minutes. The patient's level of consciousness and vital signs were monitored continuously by radiology nursing throughout the procedure under my direct supervision. COMPLICATIONS: None immediate. PROCEDURE: Informed written consent was obtained from the patient after a thorough discussion of the procedural risks, benefits and alternatives. All questions were addressed. Maximal Sterile Barrier Technique was utilized including caps, mask, sterile gowns, sterile gloves, sterile drape, hand hygiene and skin antiseptic. A timeout was performed prior to the initiation of the procedure. A planning axial CT scan was performed. The anterior mediastinal mass was successfully identified. A suitable skin entry site was selected and marked. Local anesthesia was attained by infiltration with 1% lidocaine. A small dermatotomy was made. Under intermittent CT guidance, a 17 gauge introducer needle was advanced and positioned at the margin of the mass. 18 gauge core biopsies were then obtained coaxially. Biopsy specimens were placed in formalin and delivered to pathology for further analysis. Post biopsy CT imaging demonstrates no evidence of pneumothorax, mediastinal hemorrhage or other complication. The patient tolerated the procedure well. IMPRESSION: Successful CT-guided biopsy of anterior mediastinal mass. Electronically Signed   By: Jacqulynn Cadet M.D.   On: 05/06/2022 16:57   ECHOCARDIOGRAM COMPLETE  Result Date: 05/05/2022    ECHOCARDIOGRAM REPORT   Patient Name:   BRYELLE SPIEWAK [facility] Date of Exam: 05/04/2022 Medical Rec #:  [id number]     Height:       64.0 in Accession #:     [id number]    Weight:       220.0 lb Date of Birth:  September 04, 1945     BSA:          2.037 m Patient Age:    62 years      BP:           142/61 mmHg  Patient Gender: F             HR:           64 bpm. Exam Location:  [facility] Procedure: 2D Echo, Cardiac Doppler and Color Doppler Indications:     I31.3 Pericardial, Effusion  History:         Patient has prior history of Echocardiogram examinations, most                  recent 03/15/2022. Signs/Symptoms:Murmur; Risk                  Factors:Hypertension and Sleep Apnea.  Sonographer:     Cresenciano Lick RDCS Referring Phys:  [place] Diagnosing Phys: Ida Rogue MD IMPRESSIONS  1. Left ventricular ejection fraction, by estimation, is 60 to 65%. The left ventricle has normal function. The left ventricle has no regional wall motion abnormalities. There is mild left ventricular hypertrophy. Left ventricular diastolic parameters are consistent with Grade I diastolic dysfunction (impaired relaxation).  2. Right ventricular systolic function is normal. The right ventricular size is normal. Tricuspid regurgitation signal is inadequate for assessing PA pressure.  3. Left atrial size was moderately dilated.  4. Small to moderate pericardial effusionm measuring 1.47 cm. The pericardial effusion is circumferential. There is no evidence of cardiac tamponade.  5. The mitral valve is normal in structure. No evidence of mitral valve regurgitation. No evidence of mitral stenosis.  6. The aortic valve is normal in structure. Aortic valve regurgitation is not visualized. No aortic stenosis is present.  7. The inferior vena cava is dilated in size with <50% respiratory variability, suggesting right atrial pressure of 15 mmHg. FINDINGS  Left Ventricle: Left ventricular ejection fraction, by estimation, is 60 to 65%. The left ventricle has normal function. The left ventricle has no regional wall motion abnormalities. The left ventricular internal cavity size was normal in size.  There is  mild left ventricular hypertrophy. Left ventricular diastolic parameters are consistent with Grade I diastolic dysfunction (impaired relaxation). Right Ventricle: The right ventricular size is normal. No increase in right ventricular wall thickness. Right ventricular systolic function is normal. Tricuspid regurgitation signal is inadequate for assessing PA pressure. Left Atrium: Left atrial size was moderately dilated. Right Atrium: Right atrial size was normal in size. Pericardium: A moderately sized pericardial effusion is present. The pericardial effusion is circumferential. There is no evidence of cardiac tamponade. Mitral Valve: The mitral valve is normal in structure. Mild mitral annular calcification. No evidence of mitral valve regurgitation. No evidence of mitral valve stenosis. Tricuspid Valve: The tricuspid valve is normal in structure. Tricuspid valve regurgitation is not demonstrated. No evidence of tricuspid stenosis. Aortic Valve: The aortic valve is normal in structure. Aortic valve regurgitation is not visualized. No aortic stenosis is present. Aortic valve mean gradient measures 10.5 mmHg. Aortic valve peak gradient measures 20.3 mmHg. Pulmonic Valve: The pulmonic valve was normal in structure. Pulmonic valve regurgitation is not visualized. No evidence of pulmonic stenosis. Aorta: The aortic root is normal in size and structure. Venous: The inferior vena cava is dilated in size with less than 50% respiratory variability, suggesting right atrial pressure of 15 mmHg. IAS/Shunts: No atrial level shunt detected by color flow Doppler.  LEFT VENTRICLE PLAX 2D LVIDd:         5.00 cm Diastology LVIDs:         3.40 cm LV e' medial:    7.83 cm/s LV PW:  1.00 cm LV E/e' medial:  13.1 LV IVS:        0.90 cm LV e' lateral:   7.51 cm/s                        LV E/e' lateral: 13.7  RIGHT VENTRICLE             IVC RV Basal diam:  3.30 cm     IVC diam: 2.30 cm RV S prime:     13.45 cm/s TAPSE  (M-mode): 2.9 cm LEFT ATRIUM             Index        RIGHT ATRIUM           Index LA diam:        5.20 cm 2.55 cm/m   RA Area:     15.10 cm LA Vol (A2C):   64.1 ml 31.46 ml/m  RA Volume:   37.70 ml  18.51 ml/m LA Vol (A4C):   57.6 ml 28.27 ml/m LA Biplane Vol: 60.5 ml 29.70 ml/m  AORTIC VALVE AV Vmax:           225.25 cm/s AV Vmean:          150.000 cm/s AV VTI:            0.446 m AV Peak Grad:      20.3 mmHg AV Mean Grad:      10.5 mmHg LVOT Vmax:         200.00 cm/s LVOT Vmean:        128.000 cm/s LVOT VTI:          0.406 m LVOT/AV VTI ratio: 0.91  AORTA Ao Root diam: 3.10 cm MITRAL VALVE MV Area (PHT): 3.27 cm     SHUNTS MV Decel Time: 232 msec     Systemic VTI: 0.41 m MV E velocity: 102.55 cm/s MV A velocity: 119.50 cm/s MV E/A ratio:  0.86 Ida Rogue MD Electronically signed by Ida Rogue MD Signature Date/Time: 05/05/2022/4:28:08 PM    Final    DG Chest Port 1 View  Result Date: 05/05/2022 CLINICAL DATA:  Status post attempted thoracentesis EXAM: PORTABLE CHEST 1 VIEW COMPARISON:  Previous chest radiographs done on 02/28/2022, CT done on 05/04/2022 FINDINGS: Transverse diameter of heart is increased. Central pulmonary vessels are prominent. There are no signs of alveolar pulmonary edema. There is increased density in left lower lung field. There is blunting of left lateral CP angle. There is no pneumothorax. IMPRESSION: Cardiomegaly. Central pulmonary vessels are prominent without signs of alveolar pulmonary edema. Increased density in left lower lung fields suggests pleural effusion and possibly underlying atelectasis/pneumonia. Electronically Signed   By: Elmer Picker M.D.   On: 05/05/2022 11:30   [name] CHEST (PLEURAL EFFUSION)  Result Date: 05/05/2022 CLINICAL DATA:  Pleural effusion. EXAM: CHEST ULTRASOUND COMPARISON:  One-view chest x-ray 05/05/2022 FINDINGS: A left pleural effusion is identified. Patient was not able to tolerate sitting upright. Upon lean forward, fluid is  no longer visible from the posterior approach. Thoracentesis could not be performed. IMPRESSION: 1. Left pleural effusion is not visible with the patient sitting upright and leaning forward. 2. Thoracentesis could not be performed. Electronically Signed   By: San Morelle M.D.   On: 05/05/2022 11:26   US ABDOMEN LIMITED RUQ (LIVER/GB)  Result Date: 05/04/2022 CLINICAL DATA:  [id number] Abdominal pain [id number] EXAM: ULTRASOUND ABDOMEN LIMITED RIGHT UPPER QUADRANT COMPARISON:  CT 05/04/2022 FINDINGS: Gallbladder:  Cholelithiasis with largest stone measuring up to 13 mm. No wall thickening or pericholecystic fluid. The gallbladder is nondilated. Negative sonographic Murphy sign. Common bile duct: Diameter: 4.7 mm, normal.  No intrahepatic ductal dilation. Liver: No focal lesion identified. Within normal limits in parenchymal echogenicity. Portal vein is patent on color Doppler imaging with normal direction of blood flow towards the liver. Other: Pericardial effusion and right pleural effusion. IMPRESSION: Cholelithiasis with largest stone measuring up to 13 mm. No evidence of acute cholecystitis. Pericardial effusion and right pleural effusion noted. Electronically Signed   By: Maurine Simmering M.D.   On: 05/04/2022 16:24   CT Angio Chest/Abd/Pel for Dissection W and/or Wo Contrast  Result Date: 05/04/2022 CLINICAL DATA:  Upper abdominal pain radiating to the back since yesterday. Clinical concern for acute aortic syndrome. EXAM: CT ANGIOGRAPHY CHEST, ABDOMEN AND PELVIS TECHNIQUE: Non-contrast CT of the chest was initially obtained. Multidetector CT imaging through the chest, abdomen and pelvis was performed using the standard protocol during bolus administration of intravenous contrast. Multiplanar reconstructed images and MIPs were obtained and reviewed to evaluate the vascular anatomy. RADIATION DOSE REDUCTION: This exam was performed according to the departmental dose-optimization program which includes  automated exposure control, adjustment of the mA and/or kV according to patient size and/or use of iterative reconstruction technique. CONTRAST:  123m OMNIPAQUE IOHEXOL 350 MG/ML SOLN COMPARISON:  Abdomen and pelvis CT dated 12/31/2021. Chest radiographs dated 02/28/2022. FINDINGS: CTA CHEST FINDINGS Cardiovascular: Enlarged heart. Moderate-sized pericardial effusion measuring up to 0.9 cm in thickness. Mildly enlarged central pulmonary arteries with a main pulmonary artery diameter of 3.4 cm. Mitral valve annulus calcifications. Motion artifacts involving the heart and ascending thoracic aorta. No aneurysm or dissection seen. Mediastinum/Nodes: Normal appearing thyroid gland. Anterior mediastinal mass on the left containing coarse calcifications. This mass measures 4.3 x 4.1 cm on image number [id number] and is separate from the thyroid gland and measures 3.9 cm in length on coronal image number 68/11. No enlarged lymph nodes elsewhere in the chest or axillary regions. Unremarkable esophagus. Lungs/Pleura: Small to moderate-sized left pleural effusion with associated compressive left lower lobe atelectasis. Mild dependent left upper lobe atelectasis. Minimal right pleural effusion and minimal right lower lobe atelectasis. Mildly prominent interstitial markings with bilateral centrilobular bullous changes. Musculoskeletal: Thoracic and lower cervical spine degenerative changes. Review of the MIP images confirms the above findings. CTA ABDOMEN AND PELVIS FINDINGS VASCULAR Aorta: Normal caliber aorta without aneurysm, dissection, vasculitis or significant stenosis. Celiac: Patent without evidence of aneurysm, dissection, vasculitis or significant stenosis. SMA: Patent without evidence of aneurysm, dissection, vasculitis or significant stenosis. Renals: 2 renal arteries on both sides. Both renal arteries on each side are patent without evidence of aneurysm, dissection, vasculitis, fibromuscular dysplasia or significant  stenosis. IMA: Patent without evidence of aneurysm, dissection, vasculitis or significant stenosis. Inflow: Patent without evidence of aneurysm, dissection, vasculitis or significant stenosis. Veins: No obvious venous abnormality within the limitations of this arterial phase study. Review of the MIP images confirms the above findings. NON-VASCULAR Hepatobiliary: 8 mm gallstone in the gallbladder neck without gallbladder wall thickening or pericholecystic fluid. Unremarkable liver. Pancreas: Unremarkable. No pancreatic ductal dilatation or surrounding inflammatory changes. Spleen: Normal in size without focal abnormality. Adrenals/Urinary Tract: Mild bilateral adrenal hyperplasia. Small cysts in both kidneys without significant change. These are not need follow-up. Unremarkable urinary bladder and ureters. Stomach/Bowel: Large number of colonic diverticula without evidence of diverticulitis. Unremarkable stomach, small bowel and appendix. Lymphatic: Enlarged lymph nodes. Reproductive: Calcified uterine fibroids. The  largest measures 4.9 cm. No adnexal masses. Other: No abdominal wall hernia or abnormality. No abdominopelvic ascites. Musculoskeletal: Lumbar spine degenerative changes. Review of the MIP images confirms the above findings. IMPRESSION: 1. No aortic aneurysm or dissection. 2. 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 This is separate from the thyroid gland. Differential considerations include germ cell neoplasm such as teratoma. This could be further evaluated with PET-CT or biopsy. 3. Cardiomegaly with a moderate-sized pericardial effusion. 4. Small to moderate-sized left pleural effusion with associated compressive left lower lobe atelectasis. 5. Minimal right pleural effusion and minimal right lower lobe atelectasis. 6. Centrilobular emphysema. 7. Cholelithiasis. 8. Colonic diverticulosis. Electronically Signed   By: Claudie Revering M.D.   On: 05/04/2022 14:55     Microbiology: Results for orders placed or performed during the hospital encounter of 05/10/22  Culture, blood (Routine X 2) w Reflex to ID Panel     Status: Abnormal   Collection Time: 05/12/22  8:47 AM   Specimen: BLOOD  Result Value Ref Range Status   Specimen Description   Final    BLOOD RIGHT ANTECUBITAL Performed at [facility], [address] [place], [place] [address]    Special Requests   Final    BOTTLES DRAWN AEROBIC AND ANAEROBIC Blood Culture results may not be optimal due to an excessive volume of blood received in culture bottles Performed at [facility], [address] [place], [place] [address]    Culture  Setup Time   Final    ANAEROBIC BOTTLE ONLY GRAM POSITIVE COCCI CRITICAL RESULT CALLED TO, READ BACK BY AND VERIFIED WITH: TREY GREENWOOD 05/13/2022 AT 1343 SRR    Culture (A)  Final    STAPHYLOCOCCUS EPIDERMIDIS THE SIGNIFICANCE OF ISOLATING THIS ORGANISM FROM A SINGLE SET OF BLOOD CULTURES WHEN MULTIPLE SETS ARE DRAWN IS UNCERTAIN. PLEASE NOTIFY THE MICROBIOLOGY DEPARTMENT WITHIN ONE WEEK IF SPECIATION AND SENSITIVITIES ARE REQUIRED. Performed at [facility], [place] [address] [place], [place] [address]    Report Status 05/15/2022 FINAL  Final  Culture, blood (Routine X 2) w Reflex to ID Panel     Status: None   Collection Time: 05/12/22  8:47 AM   Specimen: BLOOD  Result Value Ref Range Status   Specimen Description BLOOD LEFT ANTECUBITAL  Final   Special Requests   Final    BOTTLES DRAWN AEROBIC AND ANAEROBIC Blood Culture adequate volume   Culture   Final    NO GROWTH 5 DAYS Performed at [facility], [address] [place], [place] [address]    Report Status 05/17/2022 FINAL  Final  Blood Culture ID Panel (Reflexed)     Status: Abnormal   Collection Time: 05/12/22  8:47 AM  Result Value Ref Range Status   Enterococcus faecalis NOT DETECTED NOT DETECTED Final   Enterococcus Faecium NOT DETECTED NOT DETECTED  Final   Listeria monocytogenes NOT DETECTED NOT DETECTED Final   Staphylococcus species DETECTED (A) NOT DETECTED Final    Comment: CRITICAL RESULT CALLED TO, READ BACK BY AND VERIFIED WITH: TREY GREENWOOD 05/13/2022 AT 1343 SRR    Staphylococcus aureus (BCID) NOT DETECTED NOT DETECTED Final   Staphylococcus epidermidis DETECTED (A) NOT DETECTED Final    Comment: Methicillin (oxacillin) resistant coagulase negative staphylococcus. Possible blood culture contaminant (unless isolated from more than one blood culture draw or clinical case suggests pathogenicity). No antibiotic treatment is indicated for blood  culture contaminants. CRITICAL RESULT CALLED TO, READ BACK  BY AND VERIFIED WITH: TREY GREENWOOD 05/13/2022 AT 1343 SRR    Staphylococcus lugdunensis NOT DETECTED NOT DETECTED Final   Streptococcus species NOT DETECTED NOT DETECTED Final   Streptococcus agalactiae NOT DETECTED NOT DETECTED Final   Streptococcus pneumoniae NOT DETECTED NOT DETECTED Final   Streptococcus pyogenes NOT DETECTED NOT DETECTED Final   A.calcoaceticus-baumannii NOT DETECTED NOT DETECTED Final   Bacteroides fragilis NOT DETECTED NOT DETECTED Final   Enterobacterales NOT DETECTED NOT DETECTED Final   Enterobacter cloacae complex NOT DETECTED NOT DETECTED Final   Escherichia coli NOT DETECTED NOT DETECTED Final   Klebsiella aerogenes NOT DETECTED NOT DETECTED Final   Klebsiella oxytoca NOT DETECTED NOT DETECTED Final   Klebsiella pneumoniae NOT DETECTED NOT DETECTED Final   Proteus species NOT DETECTED NOT DETECTED Final   Salmonella species NOT DETECTED NOT DETECTED Final   Serratia marcescens NOT DETECTED NOT DETECTED Final   Haemophilus influenzae NOT DETECTED NOT DETECTED Final   Neisseria meningitidis NOT DETECTED NOT DETECTED Final   Pseudomonas aeruginosa NOT DETECTED NOT DETECTED Final   Stenotrophomonas maltophilia NOT DETECTED NOT DETECTED Final   Candida albicans NOT DETECTED NOT DETECTED Final    Candida auris NOT DETECTED NOT DETECTED Final   Candida glabrata NOT DETECTED NOT DETECTED Final   Candida krusei NOT DETECTED NOT DETECTED Final   Candida parapsilosis NOT DETECTED NOT DETECTED Final   Candida tropicalis NOT DETECTED NOT DETECTED Final   Cryptococcus neoformans/gattii NOT DETECTED NOT DETECTED Final   Methicillin resistance mecA/C DETECTED (A) NOT DETECTED Final    Comment: CRITICAL RESULT CALLED TO, READ BACK BY AND VERIFIED WITH: TREY GREENWOOD 05/13/2022 AT 1343 SRR Performed at [facility], [address] [place], [place] [address]   SARS Coronavirus 2 by RT PCR (hospital order, performed in [place] hospital lab) *cepheid single result test* Anterior Nasal Swab     Status: None   Collection Time: 05/12/22  9:10 AM   Specimen: Anterior Nasal Swab  Result Value Ref Range Status   SARS Coronavirus 2 by RT PCR NEGATIVE NEGATIVE Final    Comment: (NOTE) SARS-CoV-2 target nucleic acids are NOT DETECTED.  The SARS-CoV-2 RNA is generally detectable in upper and lower respiratory specimens during the acute phase of infection. The lowest concentration of SARS-CoV-2 viral copies this assay can detect is 250 copies / mL. A negative result does not preclude SARS-CoV-2 infection and should not be used as the sole basis for treatment or other patient management decisions.  A negative result may occur with improper specimen collection / handling, submission of specimen other than nasopharyngeal swab, presence of viral mutation(s) within the areas targeted by this assay, and inadequate number of viral copies (<250 copies / mL). A negative result must be combined with clinical observations, patient history, and epidemiological information.  Fact Sheet for Patients:   https://www..info/  Fact Sheet for Healthcare Providers: https://hall.com/  This test is not yet approved or  cleared by the [place] FDA  and has been authorized for detection and/or diagnosis of SARS-CoV-2 by FDA under an Emergency Use Authorization (EUA).  This EUA will remain in effect (meaning this test can be used) for the duration of the COVID-19 declaration under Section 564(b)(1) of the Act, 21 U.S.C. section 360bbb-3(b)(1), unless the authorization is terminated or revoked sooner.  Performed at [facility], [address] [place], [place] [address]     Labs: CBC: Recent Labs  Lab 05/18/22 0455  WBC 7.4  HGB 10.3*  HCT 34.9*  MCV 92.6  PLT 022*   Basic Metabolic Panel: Recent Labs  Lab 05/18/22 0455  NA 138  K 4.3  CL 97*  CO2 34*  GLUCOSE 118*  BUN 33*  CREATININE 1.08*  CALCIUM 9.9  MG 2.4   Liver Function Tests: No results for input(s): "AST", "ALT", "ALKPHOS", "BILITOT", "PROT", "ALBUMIN" in the last 168 hours. CBG: No results for input(s): "GLUCAP" in the last 168 hours.  Discharge time spent: greater than 30 minutes.  Signed: Fritzi Mandes, MD [facility] 05/24/2022

## 2022-05-25 DIAGNOSIS — K314 Gastric diverticulum: Secondary | ICD-10-CM | POA: Diagnosis not present

## 2022-05-25 DIAGNOSIS — I10 Essential (primary) hypertension: Secondary | ICD-10-CM | POA: Diagnosis not present

## 2022-05-26 ENCOUNTER — Telehealth: Payer: Self-pay

## 2022-05-26 NOTE — Telephone Encounter (Signed)
Spoke to Nina with [employer] and requested download.  Will await fax.

## 2022-05-27 ENCOUNTER — Encounter: Payer: Self-pay | Admitting: Primary Care

## 2022-05-27 ENCOUNTER — Ambulatory Visit (INDEPENDENT_AMBULATORY_CARE_PROVIDER_SITE_OTHER): Payer: Medicare Other | Admitting: Primary Care

## 2022-05-27 ENCOUNTER — Encounter: Payer: Medicare Other | Admitting: Internal Medicine

## 2022-05-27 ENCOUNTER — Other Ambulatory Visit: Payer: Self-pay | Admitting: *Deleted

## 2022-05-27 VITALS — BP 138/86 | HR 100 | Temp 98.0°F | Ht 64.0 in | Wt 214.0 lb

## 2022-05-27 DIAGNOSIS — G4733 Obstructive sleep apnea (adult) (pediatric): Secondary | ICD-10-CM | POA: Diagnosis not present

## 2022-05-27 NOTE — Patient Outreach (Signed)
Ms. Hatlestad resides in [facility]. Screening for potential [facility] care coordination services as benefit of insurance plan and PCP.   Secure communication sent to [facility] social worker to make aware writer is following for transition plans and potential [facility] needs.   Will follow.   Marthenia Rolling, MSN, RN,BSN [facility] Coordinator [phone] (Direct dial)

## 2022-05-27 NOTE — Assessment & Plan Note (Signed)
-   Current on CPAP at 8cm, having airleaks and residual AHI 18 - Placing an order for new CPAP machine auto 5-10cm h20 - DME company to provide patient with [employer] resp[employer]ics dreamwear hybrid full face mask size med with headgear   Follow-up: - 6 months with Eustaquio Maize NP

## 2022-05-27 NOTE — Progress Notes (Unsigned)
$'@Patient'S$  ID: Taylor Johnson, female    DOB: May 21, 1946, 76 y.o.   MRN: [id number]  Chief Complaint  Patient presents with   Follow-up    CPAP. Mask fits good. Sleeps good.    Referring provider: [name], MD  HPI: 76 year old female, never smoked.  Past medical history significant for hypertension, OSA on CPAP, GERD, obesity.  Previous [facility] pulmonary encounter: 02/28/2022 Patient presents today for new sleep consult.  She is currently on CPAP for history of obstructive sleep apnea.  She had a sleep study 03/16/2017 which showed evidence of mild obstructive sleep apnea, total AHI was 5.1/hr (events predominantly occurring during REM 21.1/hr).   Accompanied by her sister. She has been having issues with current full face mask. She has a hard time getting her mask on at night, needs something more simple. She is not having any issues with current pressure settings. Overall she is sleeping ok at night, feels for the most part well rested in the morning when she wakes up. She will occasionally take a nap mid day. Previous CPAP pressure was 8 cm H2O. She is looking to get new mask. States that current DME company does not take her insurance. She is established with [employer].  She also reports shortness of breath when walking to the mail box. She has a dry non-productive cough. She has no shortness of breath when laying flat at night or when at rest. Denies F/C/S, chest tightness or wheezing.   Sleep questionnaire Symptoms-  Hx sleep apnea, current on CPAP. Symptoms of loud snoring, daytime sleepiness Prior sleep study- August 2018 Bedtime- 10pm Time to fall asleep- Unsure  Nocturnal awakenings- three times Out of bed/start of day- 6am  Weight changes- None Do you operate heavy machinery- No Do you currently wear CPAP- Yes Do you current wear oxygen- No [facility]- 10 Medications- Zyprexa 2.'5mg'$  at bedtime    05/27/2022- Interim hx  Patient presents today of OSA follow-up. Newly  dx mediastinal mass. She was admitted in October for 12 days d/t respiratory failure and pericardial effusion. Following with Dr. Rogue Bussing with oncology for mediastinal mass, needs to reschedule PET scan and apt.  CXR was normal. Echocardogram showed normal LVEF, small or moderate pericardial effusion. Discharged on lasix prn.   She has an airsense 10 CPAP  She is on CPAP at 8cm h20.  She is sleeping well at night She is having some residual apneas She has a dark mark on her nose which is not bothering her   Airview download 03/28/21-03/27/22 Usage 352/365 days used Average usage 5 hours 16 minutes Pressure 8 cm H2O Air leaks 119 L/min (95%) AHI 18.1  No Known Allergies  Immunization History  Administered Date(s) Administered   Fluad Quad(high Dose 65+) 05/06/2022   Influenza Inj Mdck Quad Pf 05/08/2019   Influenza, High Dose Seasonal PF 04/13/2018, 04/24/2020, 05/02/2021   Influenza,inj,Quad PF,6+ Mos 05/12/2015, 04/05/2016, 03/16/2017   Influenza-Unspecified 05/10/2019   Moderna Covid-19 Vaccine Bivalent Booster 64yr & up 05/06/2021   Moderna SARS-COV2 Booster Vaccination 06/30/2020   Moderna Sars-Covid-2 Vaccination 08/22/2019, 09/19/2019   Pneumococcal Conjugate-13 04/05/2016   Pneumococcal Polysaccharide-23 01/06/2014, 09/13/2015   Zoster Recombinat (Shingrix) 05/08/2019    Past Medical History:  Diagnosis Date   Abdominal pain 01/27/2017   Anxiety    Anxiety, generalized 05/11/2015   Cough 06/02/2016   Chronic - followed by Pulmonary   Cough 06/02/2016   Chronic - followed by Pulmonary   Depression    Developmental delay  Diverticulitis    Diverticulosis of colon 05/11/2015   Dysphagia 10/15/2015   Routine Ba Swallow normal; rec modified barium swallow    GERD (gastroesophageal reflux disease)    Hematochezia 11/07/2019   Hypertension    Hypoxia 09/12/2015   Leg swelling    Localized edema 04/15/2016   Microscopic hematuria 04/15/2016   Nausea and  vomiting 01/29/2017   Orthostatic hypotension 03/16/2017   OSA on CPAP 11/18/2015   CPAP @ 18 cm H2O started 02/2016   Rectal bleeding 10/15/2018   Urge incontinence of urine 05/12/2015    Tobacco History: Social History   Tobacco Use  Smoking Status Never  Smokeless Tobacco Never  Tobacco Comments   smoking cessation materials not required   Counseling given: Not Answered Tobacco comments: smoking cessation materials not required   Outpatient Medications Prior to Visit  Medication Sig Dispense Refill   furosemide (LASIX) 20 MG tablet Take 1 tablet (20 mg total) by mouth daily as needed for edema. 30 tablet 0   hydrALAZINE (APRESOLINE) 25 MG tablet NEW PRESCRIPTION REQUEST:: TAKE ONE TABLET BY MOUTH THREE TIMES DAILY 270 tablet 0   meclizine (ANTIVERT) 12.5 MG tablet TAKE 1 TABLET BY MOUTH 3 TIMES DAILY AS NEEDED FOR DIZZINESS 30 tablet 0   metoCLOPramide (REGLAN) 5 MG tablet TAKE ONE TABLET BY MOUTH THREE TIMES A DAY 90 tablet 1   OLANZapine (ZYPREXA) 2.5 MG tablet Take 1 tablet (2.5 mg total) by mouth at bedtime. 90 tablet 1   pantoprazole (PROTONIX) 40 MG tablet Take 1 tablet (40 mg total) by mouth 2 (two) times daily. 60 tablet 1   No facility-administered medications prior to visit.      Review of Systems  Review of Systems  Constitutional:  Negative for fatigue.  HENT: Negative.    Respiratory: Negative.  Negative for cough and shortness of breath.   Cardiovascular:  Positive for leg swelling.  Psychiatric/Behavioral:  Negative for sleep disturbance.      Physical Exam  BP 138/86 (BP Location: Left Arm, Cuff Size: Normal)   Pulse 100   Temp 98 F (36.7 C)   Ht '5\' 4"'$  (1.626 m)   Wt 214 lb (97.1 kg) Comment: last recoreded weight at [facility]. patient in a wheelchair.  SpO2 96%   BMI 36.73 kg/m  Physical Exam Constitutional:      Appearance: Normal appearance. She is not ill-appearing.  Cardiovascular:     Rate and Rhythm: Normal rate.     Comments: +1 BLE  edema Pulmonary:     Effort: Pulmonary effort is normal.     Breath sounds: Normal breath sounds.  Neurological:     General: No focal deficit present.     Mental Status: She is alert and oriented to person, place, and time. Mental status is at baseline.  Psychiatric:        Mood and Affect: Mood normal.        Behavior: Behavior normal.        Thought Content: Thought content normal.        Judgment: Judgment normal.      Lab Results:  CBC    Component Value Date/Time   WBC 7.4 05/18/2022 0455   RBC 3.77 (L) 05/18/2022 0455   HGB 10.3 (L) 05/18/2022 0455   HGB 11.7 12/29/2021 1340   HCT 34.9 (L) 05/18/2022 0455   HCT 37.7 12/29/2021 1340   PLT 450 (H) 05/18/2022 0455   PLT 260 12/29/2021 1340   MCV 92.6 05/18/2022 0455  MCV 89 12/29/2021 1340   MCV 90 02/01/2014 2220   MCH 27.3 05/18/2022 0455   MCHC 29.5 (L) 05/18/2022 0455   RDW 15.4 05/18/2022 0455   RDW 15.8 (H) 12/29/2021 1340   RDW 15.9 (H) 02/01/2014 2220   LYMPHSABS 1.4 05/06/2022 0415   LYMPHSABS 1.9 12/29/2021 1340   LYMPHSABS 2.1 02/01/2014 2220   MONOABS 1.0 05/06/2022 0415   MONOABS 0.7 02/01/2014 2220   EOSABS 0.1 05/06/2022 0415   EOSABS 0.3 12/29/2021 1340   EOSABS 0.3 02/01/2014 2220   BASOSABS 0.0 05/06/2022 0415   BASOSABS 0.0 12/29/2021 1340   BASOSABS 0.0 02/01/2014 2220    BMET    Component Value Date/Time   NA 138 05/18/2022 0455   NA 140 12/29/2021 1340   NA 140 02/01/2014 2220   K 4.3 05/18/2022 0455   K 3.5 02/01/2014 2220   CL 97 (L) 05/18/2022 0455   CL 105 02/01/2014 2220   CO2 34 (H) 05/18/2022 0455   CO2 29 02/01/2014 2220   GLUCOSE 118 (H) 05/18/2022 0455   GLUCOSE 103 (H) 02/01/2014 2220   BUN 33 (H) 05/18/2022 0455   BUN 15 12/29/2021 1340   BUN 14 02/01/2014 2220   CREATININE 1.08 (H) 05/18/2022 0455   CREATININE 1.04 02/01/2014 2220   CALCIUM 9.9 05/18/2022 0455   CALCIUM 9.2 02/01/2014 2220   GFRNONAA 54 (L) 05/18/2022 0455   GFRNONAA 56 (L) 02/01/2014 2220    GFRAA 71 06/04/2020 1140   GFRAA >60 02/01/2014 2220    BNP    Component Value Date/Time   BNP 134.8 (H) 05/10/2022 1224    ProBNP No results found for: "PROBNP"  Imaging: US Venous Img Lower Bilateral (DVT)  Result Date: 05/11/2022 CLINICAL DATA:  Bilateral lower extremity pain and edema. Evaluate for DVT. EXAM: BILATERAL LOWER EXTREMITY VENOUS DOPPLER ULTRASOUND TECHNIQUE: Gray-scale sonography with graded compression, as well as color Doppler and duplex ultrasound were performed to evaluate the lower extremity deep venous systems from the level of the common femoral vein and including the common femoral, femoral, profunda femoral, popliteal and calf veins including the posterior tibial, peroneal and gastrocnemius veins when visible. The superficial great saphenous vein was also interrogated. Spectral Doppler was utilized to evaluate flow at rest and with distal augmentation maneuvers in the common femoral, femoral and popliteal veins. COMPARISON:  None Available. FINDINGS: RIGHT LOWER EXTREMITY Common Femoral Vein: No evidence of thrombus. Normal compressibility, respiratory phasicity and response to augmentation. Saphenofemoral Junction: No evidence of thrombus. Normal compressibility and flow on color Doppler imaging. Profunda Femoral Vein: No evidence of thrombus. Normal compressibility and flow on color Doppler imaging. Femoral Vein: No evidence of thrombus. Normal compressibility, respiratory phasicity and response to augmentation. Popliteal Vein: No evidence of thrombus. Normal compressibility, respiratory phasicity and response to augmentation. Calf Veins: No evidence of thrombus. Normal compressibility and flow on color Doppler imaging. Superficial Great Saphenous Vein: No evidence of thrombus. Normal compressibility. Other Findings:  None. LEFT LOWER EXTREMITY Common Femoral Vein: No evidence of thrombus. Normal compressibility, respiratory phasicity and response to augmentation.  Saphenofemoral Junction: No evidence of thrombus. Normal compressibility and flow on color Doppler imaging. Profunda Femoral Vein: No evidence of thrombus. Normal compressibility and flow on color Doppler imaging. Femoral Vein: No evidence of thrombus. Normal compressibility, respiratory phasicity and response to augmentation. Popliteal Vein: No evidence of thrombus. Normal compressibility, respiratory phasicity and response to augmentation. Calf Veins: No evidence of thrombus. Normal compressibility and flow on color Doppler imaging. Superficial  Great Saphenous Vein: No evidence of thrombus. Normal compressibility. Other Findings: Note is made of an approximately 2.3 x 2.1 x 0.7 cm fluid collection within the left popliteal fossa compatible with a Baker's cyst IMPRESSION: 1. No evidence of DVT within either lower extremity. 2. Incidentally noted approximately 2.3 cm left-sided Baker's cyst. Electronically Signed   By: Sandi Mariscal M.D.   On: 05/11/2022 15:40   ECHOCARDIOGRAM LIMITED  Result Date: 05/11/2022    ECHOCARDIOGRAM LIMITED REPORT   Patient Name:   Taylor Johnson [facility] Date of Exam: 05/10/2022 Medical Rec #:  [id number]     Height:       64.0 in Accession #:    [id number]    Weight:       213.8 lb Date of Birth:  August 19, 1945     BSA:          2.013 m Patient Age:    38 years      BP:           110/44 mmHg Patient Gender: F             HR:           83 bpm. Exam Location:  [facility] Procedure: 2D Echo, Limited Echo and Limited Color Doppler Indications:     I31.3 Pericardial Effusion  History:         Patient has prior history of Echocardiogram examinations, most                  recent 05/04/2022. Signs/Symptoms:Murmur; Risk                  Factors:Hypertension and Sleep Apnea.  Sonographer:     Cresenciano Lick RDCS Referring Phys:  [id number] Jose [place] Diagnosing Phys: Nelva Bush MD IMPRESSIONS  1. Left ventricular ejection fraction, by estimation, is 60 to 65%. The left ventricle has normal  function. The left ventricle has no regional wall motion abnormalities. There is moderate left ventricular hypertrophy.  2. Right ventricular systolic function is normal. The right ventricular size is normal. Moderately increased right ventricular wall thickness.  3. There is a small to moderate pericardial effusion. The pericardial effusion is circumferential. There is no evidence of cardiac tamponade.  4. Trivial mitral valve regurgitation. No evidence of mitral stenosis.  5. Aortic valve regurgitation is not visualized. No aortic stenosis is present.  6. The inferior vena cava is normal in size with greater than 50% respiratory variability, suggesting right atrial pressure of 3 mmHg. Comparison(s): A prior study was performed on 05/04/2022. No significant change since prior study. FINDINGS  Left Ventricle: Left ventricular ejection fraction, by estimation, is 60 to 65%. The left ventricle has normal function. The left ventricle has no regional wall motion abnormalities. The left ventricular internal cavity size was normal in size. There is  moderate left ventricular hypertrophy. Right Ventricle: The right ventricular size is normal. Moderately increased right ventricular wall thickness. Right ventricular systolic function is normal. Pericardium: There is a small to moderate pericardial effusion. The pericardial effusion is circumferential. There is no evidence of cardiac tamponade. Mitral Valve: There is mild thickening of the mitral valve leaflet(s). Trivial mitral valve regurgitation. No evidence of mitral valve stenosis. Tricuspid Valve: The tricuspid valve is normal in structure. Tricuspid valve regurgitation is mild. Aortic Valve: Aortic valve regurgitation is not visualized. No aortic stenosis is present. Aorta: The aortic root is normal in size and structure. Venous: The inferior vena cava is normal in size with greater  than 50% respiratory variability, suggesting right atrial pressure of 3 mmHg.  Additional Comments: There is a small pleural effusion in the left lateral region. LEFT VENTRICLE PLAX 2D LVIDd:         3.90 cm LVIDs:         2.40 cm LV PW:         1.40 cm LV IVS:        1.40 cm  LEFT ATRIUM         Index LA diam:    4.00 cm 1.99 cm/m   AORTA Ao Root diam: 3.30 cm Nelva Bush MD Electronically signed by Nelva Bush MD Signature Date/Time: 05/11/2022/7:00:46 AM    Final    DG Chest 2 View  Result Date: 05/10/2022 CLINICAL DATA:  Bilateral leg swelling.  Short of breath. EXAM: CHEST - 2 VIEW COMPARISON:  05/05/2022.  CT, 05/04/2022. FINDINGS: Stable enlargement of the cardiopericardial silhouette. No mediastinal or hilar masses. No evidence of adenopathy. Small left pleural effusion. Associated left lung base opacity consistent with atelectasis. Remainder of the lungs is clear. No pneumothorax. Skeletal structures are intact. IMPRESSION: 1. No acute cardiopulmonary disease. 2. Stable enlargement of the cardiopericardial silhouette. This was found to be in part due to a pericardial effusion on the prior CT. 3. Small left pleural effusion with associated basilar atelectasis similar to the previous exam. Electronically Signed   By: Lajean Manes M.D.   On: 05/10/2022 13:11   CT BIOPSY  Result Date: 05/06/2022 INDICATION: 76 year old female with anterior mediastinal mass EXAM: CT-guided biopsy MEDICATIONS: None. ANESTHESIA/SEDATION: Moderate (conscious) sedation was employed during this procedure. A total of Versed 1 mg and Fentanyl 50 mcg was administered intravenously by the radiology nurse. Total intra-service moderate Sedation Time: 16 minutes. The patient's level of consciousness and vital signs were monitored continuously by radiology nursing throughout the procedure under my direct supervision. COMPLICATIONS: None immediate. PROCEDURE: Informed written consent was obtained from the patient after a thorough discussion of the procedural risks, benefits and alternatives. All  questions were addressed. Maximal Sterile Barrier Technique was utilized including caps, mask, sterile gowns, sterile gloves, sterile drape, hand hygiene and skin antiseptic. A timeout was performed prior to the initiation of the procedure. A planning axial CT scan was performed. The anterior mediastinal mass was successfully identified. A suitable skin entry site was selected and marked. Local anesthesia was attained by infiltration with 1% lidocaine. A small dermatotomy was made. Under intermittent CT guidance, a 17 gauge introducer needle was advanced and positioned at the margin of the mass. 18 gauge core biopsies were then obtained coaxially. Biopsy specimens were placed in formalin and delivered to pathology for further analysis. Post biopsy CT imaging demonstrates no evidence of pneumothorax, mediastinal hemorrhage or other complication. The patient tolerated the procedure well. IMPRESSION: Successful CT-guided biopsy of anterior mediastinal mass. Electronically Signed   By: Jacqulynn Cadet M.D.   On: 05/06/2022 16:57   ECHOCARDIOGRAM COMPLETE  Result Date: 05/05/2022    ECHOCARDIOGRAM REPORT   Patient Name:   Taylor Johnson [facility] Date of Exam: 05/04/2022 Medical Rec #:  [id number]     Height:       64.0 in Accession #:    [id number]    Weight:       220.0 lb Date of Birth:  1946/06/20     BSA:          2.037 m Patient Age:    6 years      BP:  142/61 mmHg Patient Gender: F             HR:           64 bpm. Exam Location:  [facility] Procedure: 2D Echo, Cardiac Doppler and Color Doppler Indications:     I31.3 Pericardial, Effusion  History:         Patient has prior history of Echocardiogram examinations, most                  recent 03/15/2022. Signs/Symptoms:Murmur; Risk                  Factors:Hypertension and Sleep Apnea.  Sonographer:     Cresenciano Lick RDCS Referring Phys:  [place] Diagnosing Phys: Ida Rogue MD IMPRESSIONS  1. Left ventricular ejection fraction, by estimation, is  60 to 65%. The left ventricle has normal function. The left ventricle has no regional wall motion abnormalities. There is mild left ventricular hypertrophy. Left ventricular diastolic parameters are consistent with Grade I diastolic dysfunction (impaired relaxation).  2. Right ventricular systolic function is normal. The right ventricular size is normal. Tricuspid regurgitation signal is inadequate for assessing PA pressure.  3. Left atrial size was moderately dilated.  4. Small to moderate pericardial effusionm measuring 1.47 cm. The pericardial effusion is circumferential. There is no evidence of cardiac tamponade.  5. The mitral valve is normal in structure. No evidence of mitral valve regurgitation. No evidence of mitral stenosis.  6. The aortic valve is normal in structure. Aortic valve regurgitation is not visualized. No aortic stenosis is present.  7. The inferior vena cava is dilated in size with <50% respiratory variability, suggesting right atrial pressure of 15 mmHg. FINDINGS  Left Ventricle: Left ventricular ejection fraction, by estimation, is 60 to 65%. The left ventricle has normal function. The left ventricle has no regional wall motion abnormalities. The left ventricular internal cavity size was normal in size. There is  mild left ventricular hypertrophy. Left ventricular diastolic parameters are consistent with Grade I diastolic dysfunction (impaired relaxation). Right Ventricle: The right ventricular size is normal. No increase in right ventricular wall thickness. Right ventricular systolic function is normal. Tricuspid regurgitation signal is inadequate for assessing PA pressure. Left Atrium: Left atrial size was moderately dilated. Right Atrium: Right atrial size was normal in size. Pericardium: A moderately sized pericardial effusion is present. The pericardial effusion is circumferential. There is no evidence of cardiac tamponade. Mitral Valve: The mitral valve is normal in structure. Mild  mitral annular calcification. No evidence of mitral valve regurgitation. No evidence of mitral valve stenosis. Tricuspid Valve: The tricuspid valve is normal in structure. Tricuspid valve regurgitation is not demonstrated. No evidence of tricuspid stenosis. Aortic Valve: The aortic valve is normal in structure. Aortic valve regurgitation is not visualized. No aortic stenosis is present. Aortic valve mean gradient measures 10.5 mmHg. Aortic valve peak gradient measures 20.3 mmHg. Pulmonic Valve: The pulmonic valve was normal in structure. Pulmonic valve regurgitation is not visualized. No evidence of pulmonic stenosis. Aorta: The aortic root is normal in size and structure. Venous: The inferior vena cava is dilated in size with less than 50% respiratory variability, suggesting right atrial pressure of 15 mmHg. IAS/Shunts: No atrial level shunt detected by color flow Doppler.  LEFT VENTRICLE PLAX 2D LVIDd:         5.00 cm Diastology LVIDs:         3.40 cm LV e' medial:    7.83 cm/s LV PW:  1.00 cm LV E/e' medial:  13.1 LV IVS:        0.90 cm LV e' lateral:   7.51 cm/s                        LV E/e' lateral: 13.7  RIGHT VENTRICLE             IVC RV Basal diam:  3.30 cm     IVC diam: 2.30 cm RV S prime:     13.45 cm/s TAPSE (M-mode): 2.9 cm LEFT ATRIUM             Index        RIGHT ATRIUM           Index LA diam:        5.20 cm 2.55 cm/m   RA Area:     15.10 cm LA Vol (A2C):   64.1 ml 31.46 ml/m  RA Volume:   37.70 ml  18.51 ml/m LA Vol (A4C):   57.6 ml 28.27 ml/m LA Biplane Vol: 60.5 ml 29.70 ml/m  AORTIC VALVE AV Vmax:           225.25 cm/s AV Vmean:          150.000 cm/s AV VTI:            0.446 m AV Peak Grad:      20.3 mmHg AV Mean Grad:      10.5 mmHg LVOT Vmax:         200.00 cm/s LVOT Vmean:        128.000 cm/s LVOT VTI:          0.406 m LVOT/AV VTI ratio: 0.91  AORTA Ao Root diam: 3.10 cm MITRAL VALVE MV Area (PHT): 3.27 cm     SHUNTS MV Decel Time: 232 msec     Systemic VTI: 0.41 m MV E  velocity: 102.55 cm/s MV A velocity: 119.50 cm/s MV E/A ratio:  0.86 Ida Rogue MD Electronically signed by Ida Rogue MD Signature Date/Time: 05/05/2022/4:28:08 PM    Final    DG Chest Port 1 View  Result Date: 05/05/2022 CLINICAL DATA:  Status post attempted thoracentesis EXAM: PORTABLE CHEST 1 VIEW COMPARISON:  Previous chest radiographs done on 02/28/2022, CT done on 05/04/2022 FINDINGS: Transverse diameter of heart is increased. Central pulmonary vessels are prominent. There are no signs of alveolar pulmonary edema. There is increased density in left lower lung field. There is blunting of left lateral CP angle. There is no pneumothorax. IMPRESSION: Cardiomegaly. Central pulmonary vessels are prominent without signs of alveolar pulmonary edema. Increased density in left lower lung fields suggests pleural effusion and possibly underlying atelectasis/pneumonia. Electronically Signed   By: Elmer Picker M.D.   On: 05/05/2022 11:30   [name] CHEST (PLEURAL EFFUSION)  Result Date: 05/05/2022 CLINICAL DATA:  Pleural effusion. EXAM: CHEST ULTRASOUND COMPARISON:  One-view chest x-ray 05/05/2022 FINDINGS: A left pleural effusion is identified. Patient was not able to tolerate sitting upright. Upon lean forward, fluid is no longer visible from the posterior approach. Thoracentesis could not be performed. IMPRESSION: 1. Left pleural effusion is not visible with the patient sitting upright and leaning forward. 2. Thoracentesis could not be performed. Electronically Signed   By: San Morelle M.D.   On: 05/05/2022 11:26   US ABDOMEN LIMITED RUQ (LIVER/GB)  Result Date: 05/04/2022 CLINICAL DATA:  [id number] Abdominal pain [id number] EXAM: ULTRASOUND ABDOMEN LIMITED RIGHT UPPER QUADRANT COMPARISON:  CT 05/04/2022 FINDINGS: Gallbladder: Cholelithiasis  with largest stone measuring up to 13 mm. No wall thickening or pericholecystic fluid. The gallbladder is nondilated. Negative sonographic Murphy sign.  Common bile duct: Diameter: 4.7 mm, normal.  No intrahepatic ductal dilation. Liver: No focal lesion identified. Within normal limits in parenchymal echogenicity. Portal vein is patent on color Doppler imaging with normal direction of blood flow towards the liver. Other: Pericardial effusion and right pleural effusion. IMPRESSION: Cholelithiasis with largest stone measuring up to 13 mm. No evidence of acute cholecystitis. Pericardial effusion and right pleural effusion noted. Electronically Signed   By: Maurine Simmering M.D.   On: 05/04/2022 16:24   CT Angio Chest/Abd/Pel for Dissection W and/or Wo Contrast  Result Date: 05/04/2022 CLINICAL DATA:  Upper abdominal pain radiating to the back since yesterday. Clinical concern for acute aortic syndrome. EXAM: CT ANGIOGRAPHY CHEST, ABDOMEN AND PELVIS TECHNIQUE: Non-contrast CT of the chest was initially obtained. Multidetector CT imaging through the chest, abdomen and pelvis was performed using the standard protocol during bolus administration of intravenous contrast. Multiplanar reconstructed images and MIPs were obtained and reviewed to evaluate the vascular anatomy. RADIATION DOSE REDUCTION: This exam was performed according to the departmental dose-optimization program which includes automated exposure control, adjustment of the mA and/or kV according to patient size and/or use of iterative reconstruction technique. CONTRAST:  131m OMNIPAQUE IOHEXOL 350 MG/ML SOLN COMPARISON:  Abdomen and pelvis CT dated 12/31/2021. Chest radiographs dated 02/28/2022. FINDINGS: CTA CHEST FINDINGS Cardiovascular: Enlarged heart. Moderate-sized pericardial effusion measuring up to 0.9 cm in thickness. Mildly enlarged central pulmonary arteries with a main pulmonary artery diameter of 3.4 cm. Mitral valve annulus calcifications. Motion artifacts involving the heart and ascending thoracic aorta. No aneurysm or dissection seen. Mediastinum/Nodes: Normal appearing thyroid gland. Anterior  mediastinal mass on the left containing coarse calcifications. This mass measures 4.3 x 4.1 cm on image number 46/307 and is separate from the thyroid gland and measures 3.9 cm in length on coronal image number 68/11. No enlarged lymph nodes elsewhere in the chest or axillary regions. Unremarkable esophagus. Lungs/Pleura: Small to moderate-sized left pleural effusion with associated compressive left lower lobe atelectasis. Mild dependent left upper lobe atelectasis. Minimal right pleural effusion and minimal right lower lobe atelectasis. Mildly prominent interstitial markings with bilateral centrilobular bullous changes. Musculoskeletal: Thoracic and lower cervical spine degenerative changes. Review of the MIP images confirms the above findings. CTA ABDOMEN AND PELVIS FINDINGS VASCULAR Aorta: Normal caliber aorta without aneurysm, dissection, vasculitis or significant stenosis. Celiac: Patent without evidence of aneurysm, dissection, vasculitis or significant stenosis. SMA: Patent without evidence of aneurysm, dissection, vasculitis or significant stenosis. Renals: 2 renal arteries on both sides. Both renal arteries on each side are patent without evidence of aneurysm, dissection, vasculitis, fibromuscular dysplasia or significant stenosis. IMA: Patent without evidence of aneurysm, dissection, vasculitis or significant stenosis. Inflow: Patent without evidence of aneurysm, dissection, vasculitis or significant stenosis. Veins: No obvious venous abnormality within the limitations of this arterial phase study. Review of the MIP images confirms the above findings. NON-VASCULAR Hepatobiliary: 8 mm gallstone in the gallbladder neck without gallbladder wall thickening or pericholecystic fluid. Unremarkable liver. Pancreas: Unremarkable. No pancreatic ductal dilatation or surrounding inflammatory changes. Spleen: Normal in size without focal abnormality. Adrenals/Urinary Tract: Mild bilateral adrenal hyperplasia. Small  cysts in both kidneys without significant change. These are not need follow-up. Unremarkable urinary bladder and ureters. Stomach/Bowel: Large number of colonic diverticula without evidence of diverticulitis. Unremarkable stomach, small bowel and appendix. Lymphatic: Enlarged lymph nodes. Reproductive: Calcified uterine fibroids. The  largest measures 4.9 cm. No adnexal masses. Other: No abdominal wall hernia or abnormality. No abdominopelvic ascites. Musculoskeletal: Lumbar spine degenerative changes. Review of the MIP images confirms the above findings. IMPRESSION: 1. No aortic aneurysm or dissection. 2. 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 This is separate from the thyroid gland. Differential considerations include germ cell neoplasm such as teratoma. This could be further evaluated with PET-CT or biopsy. 3. Cardiomegaly with a moderate-sized pericardial effusion. 4. Small to moderate-sized left pleural effusion with associated compressive left lower lobe atelectasis. 5. Minimal right pleural effusion and minimal right lower lobe atelectasis. 6. Centrilobular emphysema. 7. Cholelithiasis. 8. Colonic diverticulosis. Electronically Signed   By: Claudie Revering M.D.   On: 05/04/2022 14:55     Assessment & Plan:   OSA on CPAP - Current on CPAP at 8cm, having airleaks and residual AHI 18 - Placing an order for new CPAP machine auto 5-10cm h20 - DME company to provide patient with [employer] respironics dreamwear hybrid full face mask size med with headgear   Follow-up: - 6 months with Beth NP      Martyn Ehrich, NP 05/27/2022

## 2022-05-27 NOTE — Patient Instructions (Addendum)
Orders: - Please place order for new  CPAP machine auto 5-10cm h20 - Please provide patient with [employer] respironics dreamwear hybrid full face mask size med with headgear   Follow-up: - 6 months with Eustaquio Maize NP

## 2022-05-27 NOTE — Telephone Encounter (Signed)
Download has been received. Will close encounter.

## 2022-05-30 ENCOUNTER — Ambulatory Visit: Payer: Medicare Other

## 2022-05-30 DIAGNOSIS — J9690 Respiratory failure, unspecified, unspecified whether with hypoxia or hypercapnia: Secondary | ICD-10-CM | POA: Diagnosis not present

## 2022-05-30 DIAGNOSIS — I10 Essential (primary) hypertension: Secondary | ICD-10-CM | POA: Diagnosis not present

## 2022-06-01 ENCOUNTER — Encounter: Payer: Self-pay | Admitting: Medical Oncology

## 2022-06-01 ENCOUNTER — Inpatient Hospital Stay (HOSPITAL_BASED_OUTPATIENT_CLINIC_OR_DEPARTMENT_OTHER): Payer: Medicare Other | Admitting: Medical Oncology

## 2022-06-01 VITALS — BP 138/70 | HR 70 | Temp 97.6°F | Wt 214.0 lb

## 2022-06-01 DIAGNOSIS — R222 Localized swelling, mass and lump, trunk: Secondary | ICD-10-CM | POA: Diagnosis not present

## 2022-06-01 DIAGNOSIS — J9859 Other diseases of mediastinum, not elsewhere classified: Secondary | ICD-10-CM

## 2022-06-01 DIAGNOSIS — I10 Essential (primary) hypertension: Secondary | ICD-10-CM | POA: Diagnosis not present

## 2022-06-01 DIAGNOSIS — Z79899 Other long term (current) drug therapy: Secondary | ICD-10-CM | POA: Diagnosis not present

## 2022-06-01 DIAGNOSIS — K219 Gastro-esophageal reflux disease without esophagitis: Secondary | ICD-10-CM | POA: Diagnosis not present

## 2022-06-01 DIAGNOSIS — R109 Unspecified abdominal pain: Secondary | ICD-10-CM | POA: Diagnosis not present

## 2022-06-01 NOTE — Progress Notes (Signed)
Hematology and Oncology Follow Up Visit  Taylor Johnson [id number] 1945-11-02 76 y.o. 06/01/2022  Past Medical History:  Diagnosis Date   Abdominal pain 01/27/2017   Anxiety    Anxiety, generalized 05/11/2015   Cough 06/02/2016   Chronic - followed by Pulmonary   Cough 06/02/2016   Chronic - followed by Pulmonary   Depression    Developmental delay    Diverticulitis    Diverticulosis of colon 05/11/2015   Dysphagia 10/15/2015   Routine Ba Swallow normal; rec modified barium swallow    GERD (gastroesophageal reflux disease)    Hematochezia 11/07/2019   Hypertension    Hypoxia 09/12/2015   Leg swelling    Localized edema 04/15/2016   Microscopic hematuria 04/15/2016   Nausea and vomiting 01/29/2017   Orthostatic hypotension 03/16/2017   OSA on CPAP 11/18/2015   CPAP @ 18 cm H2O started 02/2016   Rectal bleeding 10/15/2018   Urge incontinence of urine 05/12/2015    Principle Diagnosis:  Mediastinal Mass   Current Therapy:   None  PriorTherapy:   None    Interim History:  Taylor Johnson is back for follow-up for hospital follow up from her recent hospital stay on 05/10/2022 discharged on 05/24/2022 at [facility] for acute respiratory failure with hypoxia. Unknown cause. During her stay she was given IV lasix then to PO lasix. Symptoms improved and she was weaned onto room air.   She presents with one of her sisters who gives history for patient given her intellectual disability.   Since her hospitalization they report that she is doing well. No SOB, chest pain, peripheral edema, hemoptysis of concern. They ask what our next step is in terms of management of her mediastinal mass. It appears that she has a PET scan ordered in the system but this has not yet been scheduled. I alerted them that this is our next step.      Wt Readings from Last 3 Encounters:  06/01/22 214 lb (97.1 kg)  05/27/22 214 lb (97.1 kg)  05/24/22 202 lb 2.6 oz (91.7 kg)     Medications:   Current  Outpatient Medications:    furosemide (LASIX) 20 MG tablet, Take 1 tablet (20 mg total) by mouth daily as needed for edema., Disp: 30 tablet, Rfl: 0   hydrALAZINE (APRESOLINE) 25 MG tablet, NEW PRESCRIPTION REQUEST:: TAKE ONE TABLET BY MOUTH THREE TIMES DAILY, Disp: 270 tablet, Rfl: 0   meclizine (ANTIVERT) 12.5 MG tablet, TAKE 1 TABLET BY MOUTH 3 TIMES DAILY AS NEEDED FOR DIZZINESS, Disp: 30 tablet, Rfl: 0   metoCLOPramide (REGLAN) 5 MG tablet, TAKE ONE TABLET BY MOUTH THREE TIMES A DAY, Disp: 90 tablet, Rfl: 1   OLANZapine (ZYPREXA) 2.5 MG tablet, Take 1 tablet (2.5 mg total) by mouth at bedtime., Disp: 90 tablet, Rfl: 1   pantoprazole (PROTONIX) 40 MG tablet, Take 1 tablet (40 mg total) by mouth 2 (two) times daily., Disp: 60 tablet, Rfl: 1  Allergies: No Known Allergies  Past Medical History, Surgical history, Social history, and Family History were reviewed and updated.  Review of Systems: Review of Systems  Constitutional:  Negative for appetite change, chills, fatigue and fever.  Respiratory:  Negative for chest tightness, cough, hemoptysis, shortness of breath and wheezing.   Cardiovascular:  Negative for chest pain and leg swelling.  Gastrointestinal:  Negative for abdominal distention.  Endocrine: Negative for hot flashes.   Physical Exam:  weight is 214 lb (97.1 kg). Her tympanic temperature is 97.6 F (36.4 C).  Her blood pressure is 138/70 and her pulse is 70.   Physical Exam Vitals and nursing note reviewed.  Constitutional:      General: She is not in acute distress.    Appearance: Normal appearance. She is not ill-appearing, toxic-appearing or diaphoretic.     Comments: In a wheelchair. Able to give some history   HENT:     Head: Normocephalic and atraumatic.  Eyes:     General: No scleral icterus. Cardiovascular:     Rate and Rhythm: Normal rate and regular rhythm.     Heart sounds: Normal heart sounds.  Pulmonary:     Effort: Pulmonary effort is normal.      Breath sounds: Normal breath sounds.  Musculoskeletal:     Right lower leg: No edema.     Left lower leg: No edema.  Skin:    General: Skin is warm.     Coloration: Skin is not jaundiced or pale.     Findings: No rash.  Neurological:     Mental Status: She is alert.       Lab Results  Component Value Date   WBC 7.4 05/18/2022   HGB 10.3 (L) 05/18/2022   HCT 34.9 (L) 05/18/2022   MCV 92.6 05/18/2022   PLT 450 (H) 05/18/2022     Chemistry      Component Value Date/Time   NA 138 05/18/2022 0455   NA 140 12/29/2021 1340   NA 140 02/01/2014 2220   K 4.3 05/18/2022 0455   K 3.5 02/01/2014 2220   CL 97 (L) 05/18/2022 0455   CL 105 02/01/2014 2220   CO2 34 (H) 05/18/2022 0455   CO2 29 02/01/2014 2220   BUN 33 (H) 05/18/2022 0455   BUN 15 12/29/2021 1340   BUN 14 02/01/2014 2220   CREATININE 1.08 (H) 05/18/2022 0455   CREATININE 1.04 02/01/2014 2220      Component Value Date/Time   CALCIUM 9.9 05/18/2022 0455   CALCIUM 9.2 02/01/2014 2220   ALKPHOS 73 05/04/2022 1225   ALKPHOS 106 02/01/2014 2220   AST 19 05/04/2022 1225   AST 22 02/01/2014 2220   ALT 16 05/04/2022 1225   ALT 22 02/01/2014 2220   BILITOT 0.6 05/04/2022 1225   BILITOT 0.2 06/04/2020 1140   BILITOT 0.2 02/01/2014 2220      Impression and Plan: 1. Mediastinal mass - Ambulatory referral to Pulmonology   At this time our focus needs to be on getting her PET scan and referring her to Pulmonology to see if biopsy is reccommended or needed. I have also recommended that she follow up closely with her PCP in terms of her recent hospital stay. They are agreeable.   Disposition: PET to be scheduled today- order already in the system RTC 2 weeks MD to discuss PET scan results    Nelwyn Salisbury PA-C 11/15/20232:30 PM

## 2022-06-02 ENCOUNTER — Telehealth: Payer: Self-pay | Admitting: Internal Medicine

## 2022-06-02 DIAGNOSIS — J9690 Respiratory failure, unspecified, unspecified whether with hypoxia or hypercapnia: Secondary | ICD-10-CM | POA: Diagnosis not present

## 2022-06-02 DIAGNOSIS — R600 Localized edema: Secondary | ICD-10-CM | POA: Diagnosis not present

## 2022-06-02 DIAGNOSIS — I10 Essential (primary) hypertension: Secondary | ICD-10-CM | POA: Diagnosis not present

## 2022-06-02 NOTE — Telephone Encounter (Signed)
Pt is calling to cancel AWV for tomorrow. Please advise

## 2022-06-03 ENCOUNTER — Other Ambulatory Visit: Payer: Self-pay | Admitting: *Deleted

## 2022-06-03 ENCOUNTER — Ambulatory Visit: Payer: Medicare Other

## 2022-06-03 NOTE — Patient Outreach (Signed)
Per [facility] Ms. Olds resides in [facility]. Screening for potential [facility] care coordination services as benefit of insurance plan and PCP.  Attempted to reach Ms. Audi by phone. No answer. Phone rang and rang. Update received from SNF social worker indicating transition plans are not known at this time.   Will continue to follow and attempt to reach Ms. Malveaux regarding [facility] care coordination services.   Marthenia Rolling, MSN, RN,BSN [place] Acute Care Coordinator [phone] (Direct dial)

## 2022-06-07 DIAGNOSIS — R600 Localized edema: Secondary | ICD-10-CM | POA: Diagnosis not present

## 2022-06-07 DIAGNOSIS — J9690 Respiratory failure, unspecified, unspecified whether with hypoxia or hypercapnia: Secondary | ICD-10-CM | POA: Diagnosis not present

## 2022-06-07 DIAGNOSIS — I10 Essential (primary) hypertension: Secondary | ICD-10-CM | POA: Diagnosis not present

## 2022-06-08 ENCOUNTER — Encounter: Payer: Self-pay | Admitting: Cardiovascular Disease

## 2022-06-08 ENCOUNTER — Ambulatory Visit: Payer: Medicare Other | Attending: Cardiovascular Disease | Admitting: Cardiovascular Disease

## 2022-06-08 VITALS — BP 156/77 | HR 82 | Ht 64.0 in | Wt 202.2 lb

## 2022-06-08 DIAGNOSIS — I5032 Chronic diastolic (congestive) heart failure: Secondary | ICD-10-CM

## 2022-06-08 DIAGNOSIS — I3139 Other pericardial effusion (noninflammatory): Secondary | ICD-10-CM | POA: Diagnosis not present

## 2022-06-08 DIAGNOSIS — I340 Nonrheumatic mitral (valve) insufficiency: Secondary | ICD-10-CM | POA: Diagnosis not present

## 2022-06-08 NOTE — Progress Notes (Signed)
Cardiology Office Note   Date:  06/08/2022   ID:  Taylor Johnson, DOB 1946-06-09, MRN [id number]  PCP:  [name], MD  Cardiologist:   Kathlyn Sacramento, MD   Chief Complaint  Patient presents with   Other    ABN Echo. Meds reviewed verbally with pt.      History of Present Illness: Taylor Johnson is a 76 y.o. female who was originally referred by Geraldo Pitter from pulmonary in August for evaluation of an abnormal echocardiogram which showed normal LV systolic function, moderate pulmonary hypertension and mild to moderate mitral regurgitation. She has chronic medical conditions that include developmental delay, recently diagnosed mediastinal mass, asthma, sleep apnea on CPAP, essential hypertension She was seen by me in 2018 for exertional dyspnea.  She underwent a Lexiscan Myoview that showed no evidence of ischemia.  An echocardiogram showed normal LV systolic function with mild LVOT obstruction with peak gradient of 19 mmHg.  She was hospitalized in early October with chest pain.  CT chest showed mediastinal mass and small to moderate pericardial and pleural effusion.  Thoracentesis was attempted but not successful due to patient's agitation. She was hospitalized again in early November  with worsening shortness of breath and lower extremity edema.  BNP was mildly elevated at 134.  She was noted to be mildly hypoxic.  Echocardiogram showed normal LV systolic function with small to moderate pericardial effusion.  She improved with diuresis. CT-guided biopsy of mediastinal mass was nondiagnostic.  She is scheduled for a PET scan and follow-up with oncology after that.  The patient is at [facility] at the present time to get physical therapy.  She has been deconditioned and not able to walk on her own.  She is overall a poor historian and has developmental delay.  According to her sister, she has been losing weight with poor appetite.  She does have lower extremity edema and  they have furosemide to be used as needed.  She denies chest pain or shortness of breath.   Past Medical History:  Diagnosis Date   Abdominal pain 01/27/2017   Anxiety    Anxiety, generalized 05/11/2015   Cough 06/02/2016   Chronic - followed by Pulmonary   Cough 06/02/2016   Chronic - followed by Pulmonary   Depression    Developmental delay    Diverticulitis    Diverticulosis of colon 05/11/2015   Dysphagia 10/15/2015   Routine Ba Swallow normal; rec modified barium swallow    GERD (gastroesophageal reflux disease)    Hematochezia 11/07/2019   Hypertension    Hypoxia 09/12/2015   Leg swelling    Localized edema 04/15/2016   Microscopic hematuria 04/15/2016   Nausea and vomiting 01/29/2017   Orthostatic hypotension 03/16/2017   OSA on CPAP 11/18/2015   CPAP @ 18 cm H2O started 02/2016   Rectal bleeding 10/15/2018   Urge incontinence of urine 05/12/2015    Past Surgical History:  Procedure Laterality Date   ABDOMINAL HYSTERECTOMY     BREAST BIOPSY Left    neg   COLON SURGERY     COLONOSCOPY WITH PROPOFOL N/A 12/13/2019   Procedure: COLONOSCOPY WITH PROPOFOL;  Surgeon: Lin Landsman, MD;  Location: [facility];  Service: Endoscopy;  Laterality: N/A;   ESOPHAGOGASTRODUODENOSCOPY (EGD) WITH PROPOFOL N/A 01/29/2017   Procedure: ESOPHAGOGASTRODUODENOSCOPY (EGD) WITH PROPOFOL;  Surgeon: Wilford Corner, MD;  Location[facility];  Service: Endoscopy;  Laterality: N/A;   ESOPHAGOGASTRODUODENOSCOPY (EGD) WITH PROPOFOL N/A 08/02/2021   Procedure:  ESOPHAGOGASTRODUODENOSCOPY (EGD) WITH PROPOFOL;  Surgeon: Lin Landsman, MD;  Location[facility] ENDOSCOPY;  Service: Gastroenterology;  Laterality: N/A;     Current Outpatient Medications  Medication Sig Dispense Refill   furosemide (LASIX) 20 MG tablet Take 1 tablet (20 mg total) by mouth daily as needed for edema. 30 tablet 0   hydrALAZINE (APRESOLINE) 25 MG tablet NEW PRESCRIPTION REQUEST:: TAKE ONE TABLET BY MOUTH  THREE TIMES DAILY 270 tablet 0   meclizine (ANTIVERT) 12.5 MG tablet TAKE 1 TABLET BY MOUTH 3 TIMES DAILY AS NEEDED FOR DIZZINESS 30 tablet 0   metoCLOPramide (REGLAN) 5 MG tablet TAKE ONE TABLET BY MOUTH THREE TIMES A DAY 90 tablet 1   OLANZapine (ZYPREXA) 2.5 MG tablet Take 1 tablet (2.5 mg total) by mouth at bedtime. 90 tablet 1   pantoprazole (PROTONIX) 40 MG tablet Take 1 tablet (40 mg total) by mouth 2 (two) times daily. 60 tablet 1   No current facility-administered medications for this visit.    Allergies:   Patient has no known allergies.    Social History:  The patient  reports that she has never smoked. She has never used smokeless tobacco. She reports that she does not drink alcohol and does not use drugs.   Family History:  The patient's family history includes Cancer in her father; Dementia in her mother; Hypertension in her mother; Kidney disease in her mother.    ROS:  Please see the history of present illness.   Otherwise, review of systems are positive for none.   All other systems are reviewed and negative.    PHYSICAL EXAM: VS:  BP (!) 156/77 (BP Location: Right Arm, Patient Position: Sitting, Cuff Size: Normal)   Pulse 82   Ht '5\' 4"'$  (1.626 m)   Wt 202 lb 4 oz (91.7 kg)   SpO2 96%   BMI 34.72 kg/m  , BMI Body mass index is 34.72 kg/m. GEN: Well nourished, well developed, in no acute distress  HEENT: normal  Neck: no JVD, carotid bruits, or masses Cardiac: RRR; no murmurs, rubs, or gallops,no edema  Respiratory:  clear to auscultation bilaterally, normal work of breathing GI: soft, nontender, nondistended, + BS MS: no deformity or atrophy  Skin: warm and dry, no rash Neuro:  Strength and sensation are intact Psych: euthymic mood, full affect   EKG:  EKG is ordered today. The ekg ordered today demonstrates normal sinus rhythm with PACs, diffuse inferior and anterolateral T wave changes suggestive of ischemia.   Recent Labs: 05/04/2022: ALT  16 05/10/2022: B Natriuretic Peptide 134.8 05/18/2022: BUN 33; Creatinine, Ser 1.08; Hemoglobin 10.3; Magnesium 2.4; Platelets 450; Potassium 4.3; Sodium 138    Lipid Panel    Component Value Date/Time   CHOL 163 02/05/2019 0154   CHOL 157 06/02/2016 1034   TRIG 72 02/05/2019 0154   HDL 63 02/05/2019 0154   HDL 61 06/02/2016 1034   CHOLHDL 2.6 02/05/2019 0154   VLDL 14 02/05/2019 0154   LDLCALC 86 02/05/2019 0154   LDLCALC 80 06/02/2016 1034      Wt Readings from Last 3 Encounters:  06/08/22 202 lb 4 oz (91.7 kg)  06/01/22 214 lb (97.1 kg)  05/27/22 214 lb (97.1 kg)          06/08/2022    9:21 AM  PAD Screen  Previous PAD dx? No  Previous surgical procedure? No  Pain with walking? No  Feet/toe relief with dangling? No  Painful, non-healing ulcers? No  Extremities discolored? No  ASSESSMENT AND PLAN:  1.  Chronic diastolic heart failure with moderate pulmonary hypertension: I suspect that she has chronic diastolic heart failure as the culprit for her pulmonary hypertension.  She currently has furosemide to be used as needed.  I am hesitant to add a diuretic on a regular basis given her continued weight loss and poor appetite with undiagnosed mediastinal mass.  Continue to use furosemide as needed based on symptoms and leg edema.  Currently she denies any shortness of breath.  2.  Small to moderate pericardial effusion: Possible underlying malignancy.  Will obtain a follow-up echocardiogram in 2 months to ensure stability.  3.  Mild to moderate mitral regurgitation, this can be evaluated with upcoming echocardiogram.  4.  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    Disposition:   FU with me in 3 months  Signed,  Kathlyn Sacramento, MD  06/08/2022 9:36 AM    [place]

## 2022-06-08 NOTE — Patient Instructions (Signed)
Medication Instructions:  No changes *If you need a refill on your cardiac medications before your next appointment, please call your pharmacy*   Lab Work: None ordered If you have labs (blood work) drawn today and your tests are completely normal, you will receive your results only by: [place] (if you have MyChart) OR A paper copy in the mail If you have any lab test that is abnormal or we need to change your treatment, we will call you to review the results.   Testing/Procedures: Your physician has requested that you have an echocardiogram in 2 months. Echocardiography is a painless test that uses sound waves to create images of your heart. It provides your doctor with information about the size and shape of your heart and how well your heart's chambers and valves are working.   You may receive an ultrasound enhancing agent through an IV if needed to better visualize your heart during the echo. This procedure takes approximately one hour.  There are no restrictions for this procedure.  This will take place at [place] ([place] [address], [place]    Follow-Up: At [facility], you and your health needs are our priority.  As part of our continuing mission to provide you with exceptional heart care, we have created designated Provider Care Teams.  These Care Teams include your primary Cardiologist (physician) and Advanced Practice Providers (APPs -  Physician Assistants and Nurse Practitioners) who all work together to provide you with the care you need, when you need it.  We recommend signing up for the patient portal called "MyChart".  Sign up information is provided on this After Visit Summary.  MyChart is used to connect with patients for Virtual Visits (Telemedicine).  Patients are able to view lab/test results, encounter notes, upcoming appointments, etc.  Non-urgent messages can be sent to your provider as well.   To learn more about  what you can do with MyChart, go to [email or web address].    Your next appointment:   3 month(s)  The format for your next appointment:   In Person  Provider:   You may see Dr. Fletcher Anon or one of the following Advanced Practice Providers on your designated Care Team:   Murray Hodgkins, NP Christell Faith, PA-C Cadence Kathlen Mody, PA-C Gerrie Nordmann, NP    Important Information About Sugar

## 2022-06-10 DIAGNOSIS — R600 Localized edema: Secondary | ICD-10-CM | POA: Diagnosis not present

## 2022-06-10 DIAGNOSIS — J9690 Respiratory failure, unspecified, unspecified whether with hypoxia or hypercapnia: Secondary | ICD-10-CM | POA: Diagnosis not present

## 2022-06-14 ENCOUNTER — Other Ambulatory Visit: Payer: Self-pay | Admitting: *Deleted

## 2022-06-14 DIAGNOSIS — J9690 Respiratory failure, unspecified, unspecified whether with hypoxia or hypercapnia: Secondary | ICD-10-CM | POA: Diagnosis not present

## 2022-06-14 DIAGNOSIS — R600 Localized edema: Secondary | ICD-10-CM | POA: Diagnosis not present

## 2022-06-14 DIAGNOSIS — I3139 Other pericardial effusion (noninflammatory): Secondary | ICD-10-CM | POA: Diagnosis not present

## 2022-06-14 NOTE — Patient Outreach (Signed)
[facility] Post- Acute Care Coordinator follow up. Taylor Johnson resides in [facility]. Screening for potential [facility] care coordination services as benefit of insurance plan and PCP.  Update received from [place], [place] social worker. Taylor Johnson is transitioning to LTC this week at the facility.   No identifiable [facility] care coordination needs.   Marthenia Rolling, MSN, RN,BSN [place] Acute Care Coordinator [phone] (Direct dial)

## 2022-06-16 DIAGNOSIS — R918 Other nonspecific abnormal finding of lung field: Secondary | ICD-10-CM | POA: Diagnosis not present

## 2022-06-16 DIAGNOSIS — D72829 Elevated white blood cell count, unspecified: Secondary | ICD-10-CM | POA: Diagnosis not present

## 2022-06-16 DIAGNOSIS — I3139 Other pericardial effusion (noninflammatory): Secondary | ICD-10-CM | POA: Diagnosis not present

## 2022-06-16 DIAGNOSIS — R770 Abnormality of albumin: Secondary | ICD-10-CM | POA: Diagnosis not present

## 2022-06-17 ENCOUNTER — Encounter (HOSPITAL_COMMUNITY)
Admission: RE | Admit: 2022-06-17 | Discharge: 2022-06-17 | Disposition: A | Discharge status: Home / Self Care | Payer: Medicare Other | Source: Ambulatory Visit | Attending: Internal Medicine | Admitting: Internal Medicine

## 2022-06-17 DIAGNOSIS — R9389 Abnormal findings on diagnostic imaging of other specified body structures: Secondary | ICD-10-CM | POA: Diagnosis not present

## 2022-06-17 DIAGNOSIS — J9859 Other diseases of mediastinum, not elsewhere classified: Secondary | ICD-10-CM | POA: Diagnosis not present

## 2022-06-17 LAB — GLUCOSE, CAPILLARY: Glucose-Capillary: 127 mg/dL — ABNORMAL HIGH (ref 70–99)

## 2022-06-17 MED ORDER — FLUDEOXYGLUCOSE F - 18 (FDG) INJECTION
10.1000 | Freq: Once | INTRAVENOUS | Status: AC
  Administered 2022-06-17: 10.1 via INTRAVENOUS

## 2022-06-18 DIAGNOSIS — N390 Urinary tract infection, site not specified: Secondary | ICD-10-CM | POA: Diagnosis not present

## 2022-06-20 ENCOUNTER — Other Ambulatory Visit: Payer: Self-pay | Admitting: Internal Medicine

## 2022-06-21 DIAGNOSIS — N390 Urinary tract infection, site not specified: Secondary | ICD-10-CM | POA: Diagnosis not present

## 2022-06-21 DIAGNOSIS — D72829 Elevated white blood cell count, unspecified: Secondary | ICD-10-CM | POA: Diagnosis not present

## 2022-06-21 DIAGNOSIS — I10 Essential (primary) hypertension: Secondary | ICD-10-CM | POA: Diagnosis not present

## 2022-06-21 DIAGNOSIS — E785 Hyperlipidemia, unspecified: Secondary | ICD-10-CM | POA: Diagnosis not present

## 2022-06-21 DIAGNOSIS — R918 Other nonspecific abnormal finding of lung field: Secondary | ICD-10-CM | POA: Diagnosis not present

## 2022-06-21 DIAGNOSIS — J9601 Acute respiratory failure with hypoxia: Secondary | ICD-10-CM | POA: Diagnosis not present

## 2022-06-22 ENCOUNTER — Telehealth: Payer: Self-pay | Admitting: Internal Medicine

## 2022-06-22 ENCOUNTER — Inpatient Hospital Stay: Payer: Medicare Other | Attending: Internal Medicine | Admitting: Internal Medicine

## 2022-06-22 ENCOUNTER — Inpatient Hospital Stay: Payer: Medicare Other

## 2022-06-22 ENCOUNTER — Encounter: Payer: Self-pay | Admitting: Internal Medicine

## 2022-06-22 VITALS — BP 143/90 | HR 101 | Temp 97.9°F | Resp 18

## 2022-06-22 DIAGNOSIS — I110 Hypertensive heart disease with heart failure: Secondary | ICD-10-CM | POA: Diagnosis not present

## 2022-06-22 DIAGNOSIS — G4730 Sleep apnea, unspecified: Secondary | ICD-10-CM | POA: Diagnosis not present

## 2022-06-22 DIAGNOSIS — J9859 Other diseases of mediastinum, not elsewhere classified: Secondary | ICD-10-CM

## 2022-06-22 DIAGNOSIS — M109 Gout, unspecified: Secondary | ICD-10-CM | POA: Diagnosis not present

## 2022-06-22 DIAGNOSIS — D649 Anemia, unspecified: Secondary | ICD-10-CM | POA: Diagnosis not present

## 2022-06-22 DIAGNOSIS — I509 Heart failure, unspecified: Secondary | ICD-10-CM | POA: Diagnosis not present

## 2022-06-22 DIAGNOSIS — G8929 Other chronic pain: Secondary | ICD-10-CM | POA: Insufficient documentation

## 2022-06-22 DIAGNOSIS — R222 Localized swelling, mass and lump, trunk: Secondary | ICD-10-CM | POA: Insufficient documentation

## 2022-06-22 DIAGNOSIS — F79 Unspecified intellectual disabilities: Secondary | ICD-10-CM | POA: Diagnosis not present

## 2022-06-22 DIAGNOSIS — I3481 Nonrheumatic mitral (valve) annulus calcification: Secondary | ICD-10-CM | POA: Diagnosis not present

## 2022-06-22 DIAGNOSIS — K219 Gastro-esophageal reflux disease without esophagitis: Secondary | ICD-10-CM | POA: Insufficient documentation

## 2022-06-22 DIAGNOSIS — J432 Centrilobular emphysema: Secondary | ICD-10-CM | POA: Insufficient documentation

## 2022-06-22 DIAGNOSIS — E669 Obesity, unspecified: Secondary | ICD-10-CM | POA: Diagnosis not present

## 2022-06-22 DIAGNOSIS — Z79899 Other long term (current) drug therapy: Secondary | ICD-10-CM | POA: Diagnosis not present

## 2022-06-22 DIAGNOSIS — R531 Weakness: Secondary | ICD-10-CM

## 2022-06-22 LAB — COMPREHENSIVE METABOLIC PANEL
ALT: 11 U/L (ref 0–44)
AST: 13 U/L — ABNORMAL LOW (ref 15–41)
Albumin: 2.5 g/dL — ABNORMAL LOW (ref 3.5–5.0)
Alkaline Phosphatase: 63 U/L (ref 38–126)
Anion gap: 10 (ref 5–15)
BUN: 12 mg/dL (ref 8–23)
CO2: 31 mmol/L (ref 22–32)
Calcium: 9.3 mg/dL (ref 8.9–10.3)
Chloride: 93 mmol/L — ABNORMAL LOW (ref 98–111)
Creatinine, Ser: 0.7 mg/dL (ref 0.44–1.00)
GFR, Estimated: >60 mL/min (ref >60)
Glucose, Bld: 117 mg/dL — ABNORMAL HIGH (ref 70–99)
Potassium: 3.3 mmol/L — ABNORMAL LOW (ref 3.5–5.1)
Sodium: 134 mmol/L — ABNORMAL LOW (ref 135–145)
Total Bilirubin: 0.4 mg/dL (ref 0.3–1.2)
Total Protein: 7.6 g/dL (ref 6.5–8.1)

## 2022-06-22 LAB — CBC WITH DIFFERENTIAL/PLATELET
Abs Immature Granulocytes: 0.18 10*3/uL — ABNORMAL HIGH (ref 0.00–0.07)
Basophils Absolute: 0.1 10*3/uL (ref 0.0–0.1)
Basophils Relative: 0 %
Eosinophils Absolute: 0.1 10*3/uL (ref 0.0–0.5)
Eosinophils Relative: 1 %
HCT: 36.8 % (ref 36.0–46.0)
Hemoglobin: 11.1 g/dL — ABNORMAL LOW (ref 12.0–15.0)
Immature Granulocytes: 2 %
Lymphocytes Relative: 15 %
Lymphs Abs: 1.7 10*3/uL (ref 0.7–4.0)
MCH: 26.8 pg (ref 26.0–34.0)
MCHC: 30.2 g/dL (ref 30.0–36.0)
MCV: 88.9 fL (ref 80.0–100.0)
Monocytes Absolute: 0.8 10*3/uL (ref 0.1–1.0)
Monocytes Relative: 7 %
Neutro Abs: 8.7 10*3/uL — ABNORMAL HIGH (ref 1.7–7.7)
Neutrophils Relative %: 75 %
Platelets: 320 10*3/uL (ref 150–400)
RBC: 4.14 MIL/uL (ref 3.87–5.11)
RDW: 16.8 % — ABNORMAL HIGH (ref 11.5–15.5)
WBC: 11.5 10*3/uL — ABNORMAL HIGH (ref 4.0–10.5)
nRBC: 0 % (ref 0.0–0.2)

## 2022-06-22 LAB — IRON AND TIBC
Iron: 24 ug/dL — ABNORMAL LOW (ref 28–170)
Saturation Ratios: 14 % (ref 10.4–31.8)
TIBC: 171 ug/dL — ABNORMAL LOW (ref 250–450)
UIBC: 147 ug/dL

## 2022-06-22 LAB — FERRITIN: Ferritin: 259 ng/mL (ref 11–307)

## 2022-06-22 LAB — LACTATE DEHYDROGENASE: LDH: 119 U/L (ref 98–192)

## 2022-06-22 NOTE — Progress Notes (Signed)
[facility] NOTE  Patient Care Team: [name], MD as PCP - General (Internal Medicine) Wellington Hampshire, MD as PCP - Cardiology (Cardiology) Lin Landsman, MD as Consulting Physician (Gastroenterology)  CHIEF COMPLAINTS/PURPOSE OF CONSULTATION: Mediastinal mass  Oncology History   No history exists.    HISTORY OF PRESENTING ILLNESS: Patient in wheelchair.  Accompanied by sister.   Taylor Johnson 76 y.o.  female  patient with multiple medical problems- intellectual disability including CHF; obesity- and history of chronic abdominal pain was recently admitted to hospital for similar complaints.  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  Patient is here to review the results of her PET scan.  Patient of note was [facility] after recent hospitalizations.  As per the sister patient has been at baseline been independent of her ADLs, is currently needing help with her ADLs.  She has also been not offered any further rehabilitation/physical therapy-as she did not have any significant continued improvement.  As per the family patient does not have any eyedroop.   Review of Systems  Unable to perform ROS: Medical condition (Intellectual disability)    MEDICAL HISTORY:  Past Medical History:  Diagnosis Date   Abdominal pain 01/27/2017   Anxiety    Anxiety, generalized 05/11/2015   Cough 06/02/2016   Chronic - followed by Pulmonary   Cough 06/02/2016   Chronic - followed by Pulmonary   Depression    Developmental delay    Diverticulitis    Diverticulosis of colon 05/11/2015   Dysphagia 10/15/2015   Routine Ba Swallow normal; rec modified barium swallow    GERD (gastroesophageal reflux disease)    Hematochezia 11/07/2019   Hypertension    Hypoxia 09/12/2015   Leg swelling    Localized edema 04/15/2016   Microscopic hematuria 04/15/2016   Nausea and vomiting 01/29/2017    Orthostatic hypotension 03/16/2017   OSA on CPAP 11/18/2015   CPAP @ 18 cm H2O started 02/2016   Rectal bleeding 10/15/2018   Urge incontinence of urine 05/12/2015    SURGICAL HISTORY: Past Surgical History:  Procedure Laterality Date   ABDOMINAL HYSTERECTOMY     BREAST BIOPSY Left    neg   COLON SURGERY     COLONOSCOPY WITH PROPOFOL N/A 12/13/2019   Procedure: COLONOSCOPY WITH PROPOFOL;  Surgeon: Lin Landsman, MD;  Location: [facility] ENDOSCOPY;  Service: Endoscopy;  Laterality: N/A;   ESOPHAGOGASTRODUODENOSCOPY (EGD) WITH PROPOFOL N/A 01/29/2017   Procedure: ESOPHAGOGASTRODUODENOSCOPY (EGD) WITH PROPOFOL;  Surgeon: Wilford Corner, MD;  Location: [facility] ENDOSCOPY;  Service: Endoscopy;  Laterality: N/A;   ESOPHAGOGASTRODUODENOSCOPY (EGD) WITH PROPOFOL N/A 08/02/2021   Procedure: ESOPHAGOGASTRODUODENOSCOPY (EGD) WITH PROPOFOL;  Surgeon: Lin Landsman, MD;  Location: [facility] ENDOSCOPY;  Service: Gastroenterology;  Laterality: N/A;    SOCIAL HISTORY: Social History   Socioeconomic History   Marital status: Single    Spouse name: Not on file   Number of children: 0   Years of education: Not on file   Highest education level: 5th grade  Occupational History    Employer: retired  Tobacco Use   Smoking status: Never   Smokeless tobacco: Never   Tobacco comments:    smoking cessation materials not required  Vaping Use   Vaping Use: Never used  Substance and Sexual Activity   Alcohol use: No    Alcohol/week: 0.0 standard drinks of alcohol   Drug use: No   Sexual activity: Never    Birth  control/protection: Post-menopausal  Other Topics Concern   Not on file  Social History Narrative   Pt lives with her niece Laney Louderback   Social Determinants of Health   Financial Resource Strain: Low Risk  (03/04/2022)   Overall Financial Resource Strain (CARDIA)    Difficulty of Paying Living Expenses: Not hard at all  Food Insecurity: No Food Insecurity (05/10/2022)   Hunger  Vital Sign    Worried About Running Out of Food in the Last Year: Never true    Ran Out of Food in the Last Year: Never true  Transportation Needs: No Transportation Needs (05/10/2022)   PRAPARE - Hydrologist (Medical): No    Lack of Transportation (Non-Medical): No  Physical Activity: Inactive (05/26/2021)   Exercise Vital Sign    Days of Exercise per Week: 0 days    Minutes of Exercise per Session: 0 min  Stress: No Stress Concern Present (05/26/2021)   [place]    Feeling of Stress : Only a little  Social Connections: Socially Isolated (05/26/2021)   Social Connection and Isolation Panel [NHANES]    Frequency of Communication with Friends and Family: More than three times a week    Frequency of Social Gatherings with Friends and Family: More than three times a week    Attends Religious Services: Never    Marine scientist or Organizations: No    Attends Archivist Meetings: Never    Marital Status: Never married  Intimate Partner Violence: Not At Risk (05/10/2022)   Humiliation, Afraid, Rape, and Kick questionnaire    Fear of Current or Ex-Partner: No    Emotionally Abused: No    Physically Abused: No    Sexually Abused: No    FAMILY HISTORY: Family History  Problem Relation Age of Onset   Hypertension Mother    Kidney disease Mother    Dementia Mother    Cancer Father        lung   Bladder Cancer Neg Hx    Breast cancer Neg Hx     ALLERGIES:  has No Known Allergies.  MEDICATIONS:  Current Outpatient Medications  Medication Sig Dispense Refill   furosemide (LASIX) 20 MG tablet Take 1 tablet (20 mg total) by mouth daily as needed for edema. 30 tablet 0   hydrALAZINE (APRESOLINE) 25 MG tablet NEW PRESCRIPTION REQUEST:: TAKE ONE TABLET BY MOUTH THREE TIMES DAILY 270 tablet 0   meclizine (ANTIVERT) 12.5 MG tablet TAKE 1 TABLET BY MOUTH 3 TIMES DAILY AS NEEDED  FOR DIZZINESS 30 tablet 0   metoCLOPramide (REGLAN) 5 MG tablet TAKE ONE TABLET BY MOUTH THREE TIMES A DAY 90 tablet 1   OLANZapine (ZYPREXA) 2.5 MG tablet Take 1 tablet (2.5 mg total) by mouth at bedtime. 90 tablet 1   pantoprazole (PROTONIX) 40 MG tablet Take 1 tablet (40 mg total) by mouth 2 (two) times daily. 60 tablet 1   Sennosides-Docusate Sodium (SENNA PLUS PO) Take 1 tablet by mouth at bedtime.     No current facility-administered medications for this visit.    PHYSICAL EXAMINATION:   Vitals:   06/22/22 1100  BP: (!) 143/90  Pulse: (!) 101  Resp: 18  Temp: 97.9 F (36.6 C)  SpO2: 92%   There were no vitals filed for this visit.  Physical Exam Vitals and nursing note reviewed.  HENT:     Head: Normocephalic and atraumatic.  Mouth/Throat:     Pharynx: Oropharynx is clear.  Eyes:     Extraocular Movements: Extraocular movements intact.     Pupils: Pupils are equal, round, and reactive to light.  Cardiovascular:     Rate and Rhythm: Normal rate and regular rhythm.  Pulmonary:     Comments: Decreased breath sounds bilaterally.  Abdominal:     Palpations: Abdomen is soft.  Musculoskeletal:        General: Normal range of motion.     Cervical back: Normal range of motion.  Skin:    General: Skin is warm.  Neurological:     General: No focal deficit present.     Mental Status: She is alert and oriented to person, place, and time.  Psychiatric:        Behavior: Behavior normal.        Judgment: Judgment normal.     LABORATORY DATA:  I have reviewed the data as listed Lab Results  Component Value Date   WBC 11.5 (H) 06/22/2022   HGB 11.1 (L) 06/22/2022   HCT 36.8 06/22/2022   MCV 88.9 06/22/2022   PLT 320 06/22/2022   Recent Labs    04/03/22 0806 05/04/22 1225 05/05/22 0548 05/16/22 0505 05/18/22 0455 06/22/22 1225  NA 140 141   < > 136 138 134*  K 3.8 3.8   < > 4.3 4.3 3.3*  CL 105 106   < > 96* 97* 93*  CO2 27 29   < > 31 34* 31  GLUCOSE  122* 105*   < > 130* 118* 117*  BUN 30* 21   < > 33* 33* 12  CREATININE 1.00 0.96   < > 0.82 1.08* 0.70  CALCIUM 9.3 9.3   < > 9.6 9.9 9.3  GFRNONAA 59* >60   < > >60 54* >60  PROT 7.5 7.8  --   --   --  7.6  ALBUMIN 3.6 3.5  --   --   --  2.5*  AST 17 19  --   --   --  13*  ALT 12 16  --   --   --  11  ALKPHOS 73 73  --   --   --  63  BILITOT 0.4 0.6  --   --   --  0.4   < > = values in this interval not displayed.    RADIOGRAPHIC STUDIES: I have personally reviewed the radiological images as listed and agreed with the findings in the report. NM PET Image Initial (PI) Skull Base To Thigh (F-18 FDG)  Result Date: 06/19/2022 CLINICAL DATA:  Initial treatment strategy for mediastinal mass. EXAM: NUCLEAR MEDICINE PET SKULL BASE TO THIGH TECHNIQUE: 10.09 mCi F-18 FDG was injected intravenously. Full-ring PET imaging was performed from the skull base to thigh after the radiotracer. CT data was obtained and used for attenuation correction and anatomic localization. Fasting blood glucose: 127 mg/dl COMPARISON:  CT chest 05/04/2022 and CT AP 04/03/2022 FINDINGS: Mediastinal blood pool activity: SUV max 3.20 Liver activity: SUV max NA NECK: No hypermetabolic lymph nodes in the neck. Incidental CT findings: None. CHEST: Heterogeneous, lobulated mass containing calcifications within the anterior mediastinum measures 6.5 x 5.3 cm and has an SUV max 17.94, image 68/4. No tracer avid axillary or supraclavicular lymph nodes. Mild tracer uptake identified within the right hilar lymph node within SUV max of 4.06, image 66/4. No additional tracer avid mediastinal or hilar lymph nodes. No pleural fluid. No airspace  disease. No tracer avid pulmonary nodule or mass. Incidental CT findings: Centrilobular emphysema. Cardiac enlargement. Aortic and mitral valve calcifications identified. ABDOMEN/PELVIS: No abnormal hypermetabolic activity within the liver, pancreas, or adrenal glands. Activity within the spleen is  slightly greater than background liver activity with SUV max of 4.82. No splenomegaly or focal tracer avid splenic lesions. No hypermetabolic lymph nodes in the abdomen or pelvis. Incidental CT findings: Enlarged and partially calcified fibroid uterus. SKELETON: No focal hypermetabolic activity to suggest skeletal metastasis. Incidental CT findings: None. IMPRESSION: 1. There is intense FDG uptake associated with the anterior mediastinal mass. Differential considerations include thymoma, lymphoma, or germ cell tumor. 2. Mild tracer uptake is identified within the right hilar lymph node with SUV max of 4.06. Equivocal for nodal metastasis. 3. No signs of tracer avid distant metastatic disease. 4. Activity within the spleen is slightly greater than background liver activity. No splenomegaly or focal tracer avid splenic lesions identified. 5. Enlarged and partially calcified fibroid uterus. 6. Aortic Atherosclerosis (ICD10-I70.0) and Emphysema (ICD10-J43.9). Electronically Signed   By: Kerby Moors M.D.   On: 06/19/2022 13:58     Mediastinal mass #76 year old female patient with multiple medical problems-including CHF; obesity-intellectual disability and history of chronic abdominal pain is currently admitted to hospital for similar complaints.  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 There is intense FDG uptake associated with the anterior mediastinal mass. Differential considerations include thymoma, lymphoma, or germ cell tumor. 2. Mild tracer uptake is identified within the right hilar lymph node with SUV max of 4.06. Equivocal for nodal metastasis. 3. No signs of tracer avid distant metastatic disease. 4. Activity within the spleen is slightly greater than background liver activity. No splenomegaly or focal tracer avid splenic lesions identified.     #Chronic abdominal pain-unclear etiology no acute findings noted-differential diagnosis  includes lymphoma teratoma thymoma-status post biopsy.  Results pending.   #CHF/moderate-sized pericardial effusion -mild to moderate left pleural effusions -thoracentesis not performed lack of patient cooperation.  2D echo showed no evidence of any cardiac tamponade.   #Mild anemia hemoglobin 9. ?  Etiology.  Await work-up.   #Intellectual disability  #Incidental findings on Imaging  PET scan DEC 2023: Enlarged and partially calcified fibroid uterus; Aortic Atherosclerosis and Emphysema I reviewed/discussed/counseled the patient.   # DISPOSITION: # labs today- cbc/cmp;LDH; iron studies; ferritin- haptoglobin; paraneoplaticis antibodies  # refer to Dr.vaslow re: Bil muscle weakness/?Thymic mass- next week  # Follow up TBD- Dr.B #      Above plan of care was discussed with patient/family in detail.  My contact information was given to the patient/family.       [name], MD 06/22/2022 9:04 PM

## 2022-06-22 NOTE — Telephone Encounter (Signed)
Per 12/6 secure chat called and spoke to facility about appointment

## 2022-06-22 NOTE — Assessment & Plan Note (Addendum)
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 Differential considerations include thymoma, lymphoma, or germ cell tumor. Mild tracer uptake is identified within the right hilar lymph node with SUV max of 4.06; Equivocal for nodal metastasis. No signs of tracer avid distant metastatic disease.  # Previously CT-guided biopsy-inconclusive.  Previous discussion at tumor conference-recommend considering open biopsy.  Will again review at the tumor conference tomorrow.  Discussed with patient Sister-given the invasive nature of open biopsy reluctant.  # Significant limited mobility-inability to do her own ADLs at the rehab.  Question autoimmune disorder given thymic mass.  Rec neurology evaluation Dr. Mickeal Skinner.  Also will order paraneoplastic antibody panel.   #CHF/moderate-sized pericardial effusion -mild to moderate left pleural effusions -thoracentesis not performed lack of patient cooperation.  2D echo showed no evidence of any cardiac tamponade.   #Mild anemia hemoglobin 9. ?  Check labs including iron studies haptoglobin LDH.   #Intellectual disability  #Incidental findings on Imaging  PET scan DEC 2023: Enlarged and partially calcified fibroid uterus; Aortic Atherosclerosis and Emphysema I reviewed/discussed/counseled the patient.   # DISPOSITION: # labs today- cbc/cmp;LDH; iron studies; ferritin- haptoglobin; paraneoplaticis antibodies  # refer to Dr.vaslow re: Bil muscle weakness/?Thymic mass- next week  # Follow up TBD- Dr.B

## 2022-06-22 NOTE — Progress Notes (Signed)
Current resident at [facility].  Unable to walk at this time and was performing ADLs prior to hospital admission on 05/10/22.

## 2022-06-23 ENCOUNTER — Other Ambulatory Visit: Payer: Medicare Other

## 2022-06-23 DIAGNOSIS — I10 Essential (primary) hypertension: Secondary | ICD-10-CM | POA: Diagnosis not present

## 2022-06-23 DIAGNOSIS — N390 Urinary tract infection, site not specified: Secondary | ICD-10-CM | POA: Diagnosis not present

## 2022-06-24 DIAGNOSIS — R600 Localized edema: Secondary | ICD-10-CM | POA: Diagnosis not present

## 2022-06-24 DIAGNOSIS — J9690 Respiratory failure, unspecified, unspecified whether with hypoxia or hypercapnia: Secondary | ICD-10-CM | POA: Diagnosis not present

## 2022-06-24 LAB — PARANEOPLASTIC AB
AGNA-1: NEGATIVE
Amphiphysin Antibody: NEGATIVE
Anti-Hu Ab: NEGATIVE
Anti-Ri Ab: NEGATIVE
Anti-Yo Ab: NEGATIVE
Antineruonal nuclear Ab Type 3: NEGATIVE
CASPR2 Antibody,Cell-based IFA: NEGATIVE
CRMP-5 IgG: NEGATIVE
Interpretation: NEGATIVE
LGI1 Antibody, Cell-based IFA: NEGATIVE
Purkinje Cell Cyto Ab Type 2: NEGATIVE
Purkinje Cell Cyto Ab Type Tr: NEGATIVE
VGCC Antibody: <1 pmol/L (ref 0.0–30.0)

## 2022-06-25 LAB — HAPTOGLOBIN: Haptoglobin: 524 mg/dL — ABNORMAL HIGH (ref 42–346)

## 2022-06-27 ENCOUNTER — Inpatient Hospital Stay (HOSPITAL_BASED_OUTPATIENT_CLINIC_OR_DEPARTMENT_OTHER): Payer: Medicare Other | Admitting: Internal Medicine

## 2022-06-27 ENCOUNTER — Inpatient Hospital Stay: Payer: Medicare Other

## 2022-06-27 ENCOUNTER — Other Ambulatory Visit: Payer: Self-pay

## 2022-06-27 VITALS — BP 138/59 | HR 98 | Temp 98.1°F | Resp 16

## 2022-06-27 DIAGNOSIS — G7000 Myasthenia gravis without (acute) exacerbation: Secondary | ICD-10-CM

## 2022-06-27 DIAGNOSIS — G4730 Sleep apnea, unspecified: Secondary | ICD-10-CM | POA: Diagnosis not present

## 2022-06-27 DIAGNOSIS — R531 Weakness: Secondary | ICD-10-CM

## 2022-06-27 DIAGNOSIS — I3481 Nonrheumatic mitral (valve) annulus calcification: Secondary | ICD-10-CM | POA: Diagnosis not present

## 2022-06-27 DIAGNOSIS — G8929 Other chronic pain: Secondary | ICD-10-CM | POA: Diagnosis not present

## 2022-06-27 DIAGNOSIS — M109 Gout, unspecified: Secondary | ICD-10-CM | POA: Diagnosis not present

## 2022-06-27 DIAGNOSIS — D4989 Neoplasm of unspecified behavior of other specified sites: Secondary | ICD-10-CM | POA: Diagnosis not present

## 2022-06-27 DIAGNOSIS — D649 Anemia, unspecified: Secondary | ICD-10-CM | POA: Diagnosis not present

## 2022-06-27 DIAGNOSIS — Z79899 Other long term (current) drug therapy: Secondary | ICD-10-CM | POA: Diagnosis not present

## 2022-06-27 DIAGNOSIS — I509 Heart failure, unspecified: Secondary | ICD-10-CM | POA: Diagnosis not present

## 2022-06-27 DIAGNOSIS — R222 Localized swelling, mass and lump, trunk: Secondary | ICD-10-CM | POA: Diagnosis not present

## 2022-06-27 DIAGNOSIS — J432 Centrilobular emphysema: Secondary | ICD-10-CM | POA: Diagnosis not present

## 2022-06-27 DIAGNOSIS — I110 Hypertensive heart disease with heart failure: Secondary | ICD-10-CM | POA: Diagnosis not present

## 2022-06-27 DIAGNOSIS — K219 Gastro-esophageal reflux disease without esophagitis: Secondary | ICD-10-CM | POA: Diagnosis not present

## 2022-06-27 NOTE — Progress Notes (Signed)
[facility] at [place], [place] [address] [phone]   New Patient Evaluation  Date of Service: 06/27/22 Patient Name: Taylor Johnson Patient MRN: [id number] Patient DOB: 1946/01/31 Provider: Ventura Sellers, MD  Identifying Statement:  Taylor Johnson is a 76 y.o. female with MG with thymoma (myasthena gravis) ([place]) - Plan: Acetylcholine Receptor Ab, All  Generalized weakness who presents for initial consultation and evaluation regarding cancer associated neurologic deficits.    Referring Provider: [name], MD [address] [place],  [place] [address]  Primary Cancer:  Oncologic History: Oncology History   No history exists.    History of Present Illness: The patient's records from the referring physician were obtained and reviewed and the patient interviewed to confirm this HPI.  Taylor Johnson presented to medical attention in October this year with complaint of generalized weakness.  This had been progressive in prior weeks, she went from ambulating independently to "not being able to stand on my own" over that time.  Family also noted some drooping of eyelids over that time, although patient is unsure of this.  She denies fatigue-able weakness in arms, denies double vision.  Unclear if symptoms fluctuate throughout the day.  CT and subsequent PET imaging of chest demonstrated a thymus tumor.  She is currently residing in a SNF.  Following with Dr. Rogue Bussing for tumor as well.  Baseline cognitive impairment, not independent with ADLs even prior to these symptoms.  Medications: Current Outpatient Medications on File Prior to Visit  Medication Sig Dispense Refill   furosemide (LASIX) 20 MG tablet Take 1 tablet (20 mg total) by mouth daily as needed for edema. 30 tablet 0   hydrALAZINE (APRESOLINE) 25 MG tablet NEW PRESCRIPTION REQUEST:: TAKE ONE TABLET BY MOUTH THREE TIMES DAILY 270 tablet 0   meclizine  (ANTIVERT) 12.5 MG tablet TAKE 1 TABLET BY MOUTH 3 TIMES DAILY AS NEEDED FOR DIZZINESS 30 tablet 0   metoCLOPramide (REGLAN) 5 MG tablet TAKE ONE TABLET BY MOUTH THREE TIMES A DAY 90 tablet 1   OLANZapine (ZYPREXA) 2.5 MG tablet Take 1 tablet (2.5 mg total) by mouth at bedtime. 90 tablet 1   Sennosides-Docusate Sodium (SENNA PLUS PO) Take 1 tablet by mouth at bedtime.     pantoprazole (PROTONIX) 40 MG tablet Take 1 tablet (40 mg total) by mouth 2 (two) times daily. 60 tablet 1   No current facility-administered medications on file prior to visit.    Allergies: No Known Allergies Past Medical History:  Past Medical History:  Diagnosis Date   Abdominal pain 01/27/2017   Anxiety    Anxiety, generalized 05/11/2015   Cough 06/02/2016   Chronic - followed by Pulmonary   Cough 06/02/2016   Chronic - followed by Pulmonary   Depression    Developmental delay    Diverticulitis    Diverticulosis of colon 05/11/2015   Dysphagia 10/15/2015   Routine Ba Swallow normal; rec modified barium swallow    GERD (gastroesophageal reflux disease)    Hematochezia 11/07/2019   Hypertension    Hypoxia 09/12/2015   Leg swelling    Localized edema 04/15/2016   Microscopic hematuria 04/15/2016   Nausea and vomiting 01/29/2017   Orthostatic hypotension 03/16/2017   OSA on CPAP 11/18/2015   CPAP @ 18 cm H2O started 02/2016   Rectal bleeding 10/15/2018   Urge incontinence of urine 05/12/2015   Past Surgical History:  Past Surgical History:  Procedure  Laterality Date   ABDOMINAL HYSTERECTOMY     BREAST BIOPSY Left    neg   COLON SURGERY     COLONOSCOPY WITH PROPOFOL N/A 12/13/2019   Procedure: COLONOSCOPY WITH PROPOFOL;  Surgeon: Lin Landsman, MD;  Location: [facility] ENDOSCOPY;  Service: Endoscopy;  Laterality: N/A;   ESOPHAGOGASTRODUODENOSCOPY (EGD) WITH PROPOFOL N/A 01/29/2017   Procedure: ESOPHAGOGASTRODUODENOSCOPY (EGD) WITH PROPOFOL;  Surgeon: Wilford Corner, MD;  Location: [facility] ENDOSCOPY;   Service: Endoscopy;  Laterality: N/A;   ESOPHAGOGASTRODUODENOSCOPY (EGD) WITH PROPOFOL N/A 08/02/2021   Procedure: ESOPHAGOGASTRODUODENOSCOPY (EGD) WITH PROPOFOL;  Surgeon: Lin Landsman, MD;  Location: [facility] ENDOSCOPY;  Service: Gastroenterology;  Laterality: N/A;   Social History:  Social History   Socioeconomic History   Marital status: Single    Spouse name: Not on file   Number of children: 0   Years of education: Not on file   Highest education level: 5th grade  Occupational History    Employer: retired  Tobacco Use   Smoking status: Never   Smokeless tobacco: Never   Tobacco comments:    smoking cessation materials not required  Vaping Use   Vaping Use: Never used  Substance and Sexual Activity   Alcohol use: No    Alcohol/week: 0.0 standard drinks of alcohol   Drug use: No   Sexual activity: Never    Birth control/protection: Post-menopausal  Other Topics Concern   Not on file  Social History Narrative   Pt lives with her niece Alvie Heidelberg Gertz   Social Determinants of Health   Financial Resource Strain: Low Risk  (03/04/2022)   Overall Financial Resource Strain (CARDIA)    Difficulty of Paying Living Expenses: Not hard at all  Food Insecurity: No Food Insecurity (05/10/2022)   Hunger Vital Sign    Worried About Running Out of Food in the Last Year: Never true    Ran Out of Food in the Last Year: Never true  Transportation Needs: No Transportation Needs (05/10/2022)   PRAPARE - Hydrologist (Medical): No    Lack of Transportation (Non-Medical): No  Physical Activity: Inactive (05/26/2021)   Exercise Vital Sign    Days of Exercise per Week: 0 days    Minutes of Exercise per Session: 0 min  Stress: No Stress Concern Present (05/26/2021)   [place]    Feeling of Stress : Only a little  Social Connections: Socially Isolated (05/26/2021)   Social Connection and  Isolation Panel [NHANES]    Frequency of Communication with Friends and Family: More than three times a week    Frequency of Social Gatherings with Friends and Family: More than three times a week    Attends Religious Services: Never    Marine scientist or Organizations: No    Attends Archivist Meetings: Never    Marital Status: Never married  Intimate Partner Violence: Not At Risk (05/10/2022)   Humiliation, Afraid, Rape, and Kick questionnaire    Fear of Current or Ex-Partner: No    Emotionally Abused: No    Physically Abused: No    Sexually Abused: No   Family History:  Family History  Problem Relation Age of Onset   Hypertension Mother    Kidney disease Mother    Dementia Mother    Cancer Father        lung   Bladder Cancer Neg Hx    Breast cancer Neg Hx  Review of Systems: Constitutional: Doesn't report fevers, chills or abnormal weight loss Eyes: Doesn't report blurriness of vision Ears, nose, mouth, throat, and face: Doesn't report sore throat Respiratory: Doesn't report cough, dyspnea or wheezes Cardiovascular: Doesn't report palpitation, chest discomfort  Gastrointestinal:  Doesn't report nausea, constipation, diarrhea GU: Doesn't report incontinence Skin: Doesn't report skin rashes Neurological: Per HPI Musculoskeletal: Doesn't report joint pain Behavioral/Psych: Doesn't report anxiety  Physical Exam: Vitals:   06/27/22 1209  BP: (!) 138/59  Pulse: 98  Resp: 16  Temp: 98.1 F (36.7 C)  SpO2: 93%   KPS: 60. General: Alert, cooperative, pleasant, in no acute distress Head: Normal EENT: No conjunctival injection or scleral icterus.  Lungs: Resp effort normal Cardiac: Regular rate Abdomen: Non-distended abdomen Skin: No rashes cyanosis or petechiae. Extremities: No clubbing or edema  Neurologic Exam: Mental Status: Awake, alert, attentive to examiner. Oriented to self and environment. Language is fluent with intact comprehension.   Psychomotor slowing, static encephalopathy noted. Cranial Nerves: Visual acuity is grossly normal. Visual fields are full. Extra-ocular movements intact. Noted bilateral ptosis. Face is symmetric Motor: Tone and bulk are normal. Power is antigravity in both arms and legs, more careful motor testing is limited by patient's cognitive impairment. Reflexes are diminished, no pathologic reflexes present.  Sensory: Intact to light touch Gait: Non ambulatory  Labs: I have reviewed the data as listed    Component Value Date/Time   NA 134 (L) 06/22/2022 1225   NA 140 12/29/2021 1340   NA 140 02/01/2014 2220   K 3.3 (L) 06/22/2022 1225   K 3.5 02/01/2014 2220   CL 93 (L) 06/22/2022 1225   CL 105 02/01/2014 2220   CO2 31 06/22/2022 1225   CO2 29 02/01/2014 2220   GLUCOSE 117 (H) 06/22/2022 1225   GLUCOSE 103 (H) 02/01/2014 2220   BUN 12 06/22/2022 1225   BUN 15 12/29/2021 1340   BUN 14 02/01/2014 2220   CREATININE 0.70 06/22/2022 1225   CREATININE 1.04 02/01/2014 2220   CALCIUM 9.3 06/22/2022 1225   CALCIUM 9.2 02/01/2014 2220   PROT 7.6 06/22/2022 1225   PROT 7.5 06/04/2020 1140   PROT 8.6 (H) 02/01/2014 2220   ALBUMIN 2.5 (L) 06/22/2022 1225   ALBUMIN 4.4 06/04/2020 1140   ALBUMIN 3.6 02/01/2014 2220   AST 13 (L) 06/22/2022 1225   AST 22 02/01/2014 2220   ALT 11 06/22/2022 1225   ALT 22 02/01/2014 2220   ALKPHOS 63 06/22/2022 1225   ALKPHOS 106 02/01/2014 2220   BILITOT 0.4 06/22/2022 1225   BILITOT 0.2 06/04/2020 1140   BILITOT 0.2 02/01/2014 2220   GFRNONAA >60 06/22/2022 1225   GFRNONAA 56 (L) 02/01/2014 2220   GFRAA 71 06/04/2020 1140   GFRAA >60 02/01/2014 2220   Lab Results  Component Value Date   WBC 11.5 (H) 06/22/2022   NEUTROABS 8.7 (H) 06/22/2022   HGB 11.1 (L) 06/22/2022   HCT 36.8 06/22/2022   MCV 88.9 06/22/2022   PLT 320 06/22/2022    Imaging:  NM PET Image Initial (PI) Skull Base To Thigh (F-18 FDG)  Result Date: 06/19/2022 CLINICAL DATA:  Initial  treatment strategy for mediastinal mass. EXAM: NUCLEAR MEDICINE PET SKULL BASE TO THIGH TECHNIQUE: 10.09 mCi F-18 FDG was injected intravenously. Full-ring PET imaging was performed from the skull base to thigh after the radiotracer. CT data was obtained and used for attenuation correction and anatomic localization. Fasting blood glucose: 127 mg/dl COMPARISON:  CT chest 05/04/2022 and CT AP  04/03/2022 FINDINGS: Mediastinal blood pool activity: SUV max 3.20 Liver activity: SUV max NA NECK: No hypermetabolic lymph nodes in the neck. Incidental CT findings: None. CHEST: Heterogeneous, lobulated mass containing calcifications within the anterior mediastinum measures 6.5 x 5.3 cm and has an SUV max 17.94, image 68/4. No tracer avid axillary or supraclavicular lymph nodes. Mild tracer uptake identified within the right hilar lymph node within SUV max of 4.06, image 66/4. No additional tracer avid mediastinal or hilar lymph nodes. No pleural fluid. No airspace disease. No tracer avid pulmonary nodule or mass. Incidental CT findings: Centrilobular emphysema. Cardiac enlargement. Aortic and mitral valve calcifications identified. ABDOMEN/PELVIS: No abnormal hypermetabolic activity within the liver, pancreas, or adrenal glands. Activity within the spleen is slightly greater than background liver activity with SUV max of 4.82. No splenomegaly or focal tracer avid splenic lesions. No hypermetabolic lymph nodes in the abdomen or pelvis. Incidental CT findings: Enlarged and partially calcified fibroid uterus. SKELETON: No focal hypermetabolic activity to suggest skeletal metastasis. Incidental CT findings: None. IMPRESSION: 1. There is intense FDG uptake associated with the anterior mediastinal mass. Differential considerations include thymoma, lymphoma, or germ cell tumor. 2. Mild tracer uptake is identified within the right hilar lymph node with SUV max of 4.06. Equivocal for nodal metastasis. 3. No signs of tracer avid distant  metastatic disease. 4. Activity within the spleen is slightly greater than background liver activity. No splenomegaly or focal tracer avid splenic lesions identified. 5. Enlarged and partially calcified fibroid uterus. 6. Aortic Atherosclerosis (ICD10-I70.0) and Emphysema (ICD10-J43.9). Electronically Signed   By: Kerby Moors M.D.   On: 06/19/2022 13:58     Assessment/Plan MG with thymoma (myasthena gravis) ([place]) - Plan: Acetylcholine Receptor Ab, All  Generalized weakness  Ghadeer O Crisman presents with clinical syndrome consistent with progressive generalized weakness.  Etiology could be secondary to auto-immune myasthenic syndrome given clinical ptosis, non localizing weakness, and concurrent thymus mass.  Some elements of history and exam and limited by patient's static encephalopathy, difficulty with following complex commands.  We would like to get started by checking full panel of acetylcholine receptor antibodies.  Within thymoma/myesthenia group, this testing should be highly sensitive and specific.    If positive, next step in management would be resection of thymus mass, if amenable.  Could also initiate cholinesterase inhibitor.  Referral to dedicated neuromuscular specialist would also be advisable.  We spent twenty additional minutes teaching regarding the natural history, biology, and historical experience in the treatment of neurologic complications of cancer.   We appreciate the opportunity to participate in the care of Adalia O Deroche.  We will touch base with her and her family once blood test results are in.  We will continue to be in communication with Dr. Rogue Bussing as well.  All questions were answered. The patient knows to call the clinic with any problems, questions or concerns. No barriers to learning were detected.  The total time spent in the encounter was 60 minutes and more than 50% was on counseling and review of test results   Ventura Sellers, MD Medical  Director of Neuro-Oncology [facility] at [place] 06/27/22 1:14 PM

## 2022-06-30 ENCOUNTER — Telehealth: Payer: Self-pay | Admitting: Internal Medicine

## 2022-06-30 DIAGNOSIS — R600 Localized edema: Secondary | ICD-10-CM | POA: Diagnosis not present

## 2022-06-30 DIAGNOSIS — J9859 Other diseases of mediastinum, not elsewhere classified: Secondary | ICD-10-CM

## 2022-06-30 DIAGNOSIS — I10 Essential (primary) hypertension: Secondary | ICD-10-CM | POA: Diagnosis not present

## 2022-06-30 DIAGNOSIS — J9690 Respiratory failure, unspecified, unspecified whether with hypoxia or hypercapnia: Secondary | ICD-10-CM | POA: Diagnosis not present

## 2022-06-30 NOTE — Telephone Encounter (Signed)
Spoke to [employer], Ms.[name]. re: discussion at tumor conference re: evaluation with Thoracic surgery. Repeat Biopsy with IR is not recommended.   Hayley- please refer to Thoracic surgery in [facility] re: anterior mediastinal mass. Please have patient is follow up with after evaluation with Thoracic surgery.  GB

## 2022-07-01 ENCOUNTER — Ambulatory Visit: Payer: Medicare Other | Admitting: Internal Medicine

## 2022-07-01 NOTE — Addendum Note (Signed)
Addended by: Telford Nab on: 07/01/2022 08:00 AM   Modules accepted: Orders

## 2022-07-01 NOTE — Telephone Encounter (Signed)
Referral has been placed. 

## 2022-07-04 LAB — MISC LABCORP TEST (SEND OUT): Labcorp test code: 86020

## 2022-07-04 NOTE — Telephone Encounter (Signed)
Pt scheduled for thoracic surgery evaluation on 12/29 with Dr. Kipp Brood. Schedule message sent to schedule follow up appt with Dr. Jacinto Reap the following week (Jan 2nd). Pt will be called with appts once scheduled.

## 2022-07-06 ENCOUNTER — Encounter: Payer: Self-pay | Admitting: *Deleted

## 2022-07-06 NOTE — Patient Instructions (Signed)
Referral faxed to [facility] as requested by Dr. Mickeal Skinner and Dr. Rogue Bussing re: myasthenia gravis.  [facility] fax # [phone].

## 2022-07-15 ENCOUNTER — Encounter: Payer: Self-pay | Admitting: Thoracic Surgery (Cardiothoracic Vascular Surgery)

## 2022-07-15 ENCOUNTER — Institutional Professional Consult (permissible substitution) (INDEPENDENT_AMBULATORY_CARE_PROVIDER_SITE_OTHER): Payer: Medicare Other | Admitting: Thoracic Surgery (Cardiothoracic Vascular Surgery)

## 2022-07-15 VITALS — BP 124/71 | HR 92 | Resp 20 | Ht 64.0 in | Wt 202.0 lb

## 2022-07-15 DIAGNOSIS — J9859 Other diseases of mediastinum, not elsewhere classified: Secondary | ICD-10-CM | POA: Diagnosis not present

## 2022-07-17 DIAGNOSIS — Z741 Need for assistance with personal care: Secondary | ICD-10-CM | POA: Diagnosis not present

## 2022-07-17 DIAGNOSIS — I10 Essential (primary) hypertension: Secondary | ICD-10-CM | POA: Diagnosis not present

## 2022-07-17 DIAGNOSIS — F01518 Vascular dementia, unspecified severity, with other behavioral disturbance: Secondary | ICD-10-CM | POA: Diagnosis not present

## 2022-07-17 DIAGNOSIS — M1000 Idiopathic gout, unspecified site: Secondary | ICD-10-CM | POA: Diagnosis not present

## 2022-07-17 DIAGNOSIS — K5900 Constipation, unspecified: Secondary | ICD-10-CM | POA: Diagnosis not present

## 2022-07-17 DIAGNOSIS — J9601 Acute respiratory failure with hypoxia: Secondary | ICD-10-CM | POA: Diagnosis not present

## 2022-07-17 DIAGNOSIS — R2681 Unsteadiness on feet: Secondary | ICD-10-CM | POA: Diagnosis not present

## 2022-07-19 NOTE — Progress Notes (Signed)
[place] 411       Taylor [place], [address]             [id number]                    Taylor Johnson [place] Medical Record #[id number] Date of Birth: 27-Dec-1945  Referring: Taylor Johnson, * Primary Care: Taylor Hess, MD Primary Cardiologist: Taylor Sacramento, MD  Chief Complaint:    Chief Complaint  Patient presents with   Mediastinal Mass    New patient consultation, PET 12/1, CT bx of mass 10/20, CTA C/A/P 10/18     History of Present Illness:    Taylor Johnson 77 y.o. female referred for surgical evaluation of an anterior mediastinal mass that was found incidentally.  She originally was admitted several months ago for pneumonia where this was originally found.  Prior to this episode in November she was living independently but since that hospitalization she has been in a nursing home.  She mostly has been bed bound, and can only mobilize in a wheelchair.    Past Medical History:  Diagnosis Date   Abdominal pain 01/27/2017   Anxiety    Anxiety, generalized 05/11/2015   Cough 06/02/2016   Chronic - followed by Pulmonary   Cough 06/02/2016   Chronic - followed by Pulmonary   Depression    Developmental delay    Diverticulitis    Diverticulosis of colon 05/11/2015   Dysphagia 10/15/2015   Routine Ba Swallow normal; rec modified barium swallow    GERD (gastroesophageal reflux disease)    Hematochezia 11/07/2019   Hypertension    Hypoxia 09/12/2015   Leg swelling    Localized edema 04/15/2016   Microscopic hematuria 04/15/2016   Nausea and vomiting 01/29/2017   Orthostatic hypotension 03/16/2017   OSA on CPAP 11/18/2015   CPAP @ 18 cm H2O started 02/2016   Rectal bleeding 10/15/2018   Urge incontinence of urine 05/12/2015    Past Surgical History:  Procedure Laterality Date   ABDOMINAL HYSTERECTOMY     BREAST BIOPSY Left    neg   COLON SURGERY     COLONOSCOPY WITH PROPOFOL N/A 12/13/2019   Procedure: COLONOSCOPY WITH PROPOFOL;   Surgeon: Lin Landsman, MD;  Location: [facility] ENDOSCOPY;  Service: Endoscopy;  Laterality: N/A;   ESOPHAGOGASTRODUODENOSCOPY (EGD) WITH PROPOFOL N/A 01/29/2017   Procedure: ESOPHAGOGASTRODUODENOSCOPY (EGD) WITH PROPOFOL;  Surgeon: Wilford Corner, MD;  Location: [facility] ENDOSCOPY;  Service: Endoscopy;  Laterality: N/A;   ESOPHAGOGASTRODUODENOSCOPY (EGD) WITH PROPOFOL N/A 08/02/2021   Procedure: ESOPHAGOGASTRODUODENOSCOPY (EGD) WITH PROPOFOL;  Surgeon: Lin Landsman, MD;  Location: [facility] ENDOSCOPY;  Service: Gastroenterology;  Laterality: N/A;    Family History  Problem Relation Age of Onset   Hypertension Mother    Kidney disease Mother    Dementia Mother    Cancer Father        lung   Bladder Cancer Neg Hx    Breast cancer Neg Hx      Social History   Tobacco Use  Smoking Status Never  Smokeless Tobacco Never  Tobacco Comments   smoking cessation materials not required    Social History   Substance and Sexual Activity  Alcohol Use No   Alcohol/week: 0.0 standard drinks of alcohol     No Known Allergies  Current Outpatient Medications  Medication Sig Dispense Refill   furosemide (LASIX) 20 MG tablet Take 1 tablet (20 mg total) by mouth daily  as needed for edema. 30 tablet 0   hydrALAZINE (APRESOLINE) 25 MG tablet NEW PRESCRIPTION REQUEST:: TAKE ONE TABLET BY MOUTH THREE TIMES DAILY 270 tablet 0   meclizine (ANTIVERT) 12.5 MG tablet TAKE 1 TABLET BY MOUTH 3 TIMES DAILY AS NEEDED FOR DIZZINESS 30 tablet 0   metoCLOPramide (REGLAN) 5 MG tablet TAKE ONE TABLET BY MOUTH THREE TIMES A DAY 90 tablet 1   OLANZapine (ZYPREXA) 2.5 MG tablet Take 1 tablet (2.5 mg total) by mouth at bedtime. 90 tablet 1   Sennosides-Docusate Sodium (SENNA PLUS PO) Take 1 tablet by mouth at bedtime.     pantoprazole (PROTONIX) 40 MG tablet Take 1 tablet (40 mg total) by mouth 2 (two) times daily. 60 tablet 1   No current facility-administered medications for this visit.    Review of Systems   Respiratory:  Positive for shortness of breath.   Cardiovascular:  Positive for leg swelling.  Neurological: Negative.     PHYSICAL EXAMINATION: BP 124/71 (BP Location: Left Arm, Patient Position: Sitting, Cuff Size: Normal)   Pulse 92   Resp 20   Ht '5\' 4"'$  (1.626 m)   Wt 202 lb (91.6 kg)   SpO2 90% Comment: RA  BMI 34.67 kg/m   Physical Exam Constitutional:      General: She is not in acute distress.    Appearance: She is ill-appearing. She is not toxic-appearing or diaphoretic.  HENT:     Head: Normocephalic and atraumatic.  Cardiovascular:     Rate and Rhythm: Normal rate.  Pulmonary:     Effort: Pulmonary effort is normal. No respiratory distress.  Musculoskeletal:     Cervical back: Normal range of motion.     Right lower leg: Edema present.     Left lower leg: Edema present.  Skin:    General: Skin is warm and dry.  Neurological:     General: No focal deficit present.     Mental Status: She is alert and oriented to person, place, and time.      Diagnostic Studies & Laboratory data:     Recent Radiology Findings:   No results found.     I have independently reviewed the above radiology studies  and reviewed the findings with the patient.   Recent Lab Findings: Lab Results  Component Value Date   WBC 11.5 (H) 06/22/2022   HGB 11.1 (L) 06/22/2022   HCT 36.8 06/22/2022   PLT 320 06/22/2022   GLUCOSE 117 (H) 06/22/2022   CHOL 163 02/05/2019   TRIG 72 02/05/2019   HDL 63 02/05/2019   LDLCALC 86 02/05/2019   ALT 11 06/22/2022   AST 13 (L) 06/22/2022   NA 134 (L) 06/22/2022   K 3.3 (L) 06/22/2022   CL 93 (L) 06/22/2022   CREATININE 0.70 06/22/2022   BUN 12 06/22/2022   CO2 31 06/22/2022   TSH 1.150 06/04/2020   INR 1.2 05/06/2022   HGBA1C 6.1 (H) 01/31/2017         Assessment / Plan:   77 year old female with a PET avid anterior mediastinal mass.  She is also quite frail and debilitated.  I am not confident that she would recover well from  any surgical intervention at this point.  Will order tumor markers to determine if this is more likely to be a lymphoma.  If that is the case then we can consider obtaining a CT-guided biopsy.  If this is more consistent with a thymoma, we likely will not pursue any biopsy  and fear of disrupting the capsule.  I will follow-up with her after the tumor markers have resulted.      Lajuana Matte 07/19/2022 10:58 AM

## 2022-07-20 ENCOUNTER — Encounter: Payer: Self-pay | Admitting: Internal Medicine

## 2022-07-20 ENCOUNTER — Inpatient Hospital Stay: Payer: 59 | Attending: Internal Medicine | Admitting: Internal Medicine

## 2022-07-20 ENCOUNTER — Other Ambulatory Visit: Payer: Self-pay

## 2022-07-20 VITALS — BP 132/85 | HR 103 | Temp 97.9°F | Resp 18

## 2022-07-20 DIAGNOSIS — Z801 Family history of malignant neoplasm of trachea, bronchus and lung: Secondary | ICD-10-CM | POA: Insufficient documentation

## 2022-07-20 DIAGNOSIS — Z741 Need for assistance with personal care: Secondary | ICD-10-CM | POA: Diagnosis not present

## 2022-07-20 DIAGNOSIS — I110 Hypertensive heart disease with heart failure: Secondary | ICD-10-CM | POA: Insufficient documentation

## 2022-07-20 DIAGNOSIS — E669 Obesity, unspecified: Secondary | ICD-10-CM | POA: Insufficient documentation

## 2022-07-20 DIAGNOSIS — G8929 Other chronic pain: Secondary | ICD-10-CM | POA: Insufficient documentation

## 2022-07-20 DIAGNOSIS — Z79899 Other long term (current) drug therapy: Secondary | ICD-10-CM | POA: Insufficient documentation

## 2022-07-20 DIAGNOSIS — M79672 Pain in left foot: Secondary | ICD-10-CM | POA: Insufficient documentation

## 2022-07-20 DIAGNOSIS — I509 Heart failure, unspecified: Secondary | ICD-10-CM | POA: Diagnosis not present

## 2022-07-20 DIAGNOSIS — I10 Essential (primary) hypertension: Secondary | ICD-10-CM | POA: Diagnosis not present

## 2022-07-20 DIAGNOSIS — M79671 Pain in right foot: Secondary | ICD-10-CM | POA: Diagnosis not present

## 2022-07-20 DIAGNOSIS — R222 Localized swelling, mass and lump, trunk: Secondary | ICD-10-CM | POA: Insufficient documentation

## 2022-07-20 DIAGNOSIS — F01518 Vascular dementia, unspecified severity, with other behavioral disturbance: Secondary | ICD-10-CM | POA: Diagnosis not present

## 2022-07-20 DIAGNOSIS — K5900 Constipation, unspecified: Secondary | ICD-10-CM | POA: Diagnosis not present

## 2022-07-20 DIAGNOSIS — G4700 Insomnia, unspecified: Secondary | ICD-10-CM | POA: Diagnosis not present

## 2022-07-20 DIAGNOSIS — K219 Gastro-esophageal reflux disease without esophagitis: Secondary | ICD-10-CM | POA: Diagnosis not present

## 2022-07-20 DIAGNOSIS — R2681 Unsteadiness on feet: Secondary | ICD-10-CM | POA: Diagnosis not present

## 2022-07-20 DIAGNOSIS — J9859 Other diseases of mediastinum, not elsewhere classified: Secondary | ICD-10-CM | POA: Diagnosis not present

## 2022-07-20 DIAGNOSIS — R601 Generalized edema: Secondary | ICD-10-CM | POA: Diagnosis not present

## 2022-07-20 DIAGNOSIS — M1000 Idiopathic gout, unspecified site: Secondary | ICD-10-CM | POA: Diagnosis not present

## 2022-07-20 NOTE — Progress Notes (Signed)
[place] NOTE  Patient Care Team: [name], MD as PCP - General (Internal Medicine) Wellington Hampshire, MD as PCP - Cardiology (Cardiology) Lin Landsman, MD as Consulting Physician (Gastroenterology)  CHIEF COMPLAINTS/PURPOSE OF CONSULTATION: Mediastinal mass  Oncology History   No history exists.    HISTORY OF PRESENTING ILLNESS: Patient in wheelchair.  Accompanied by sister.   Taylor Johnson 77 y.o.  female  patient with multiple medical problems- intellectual disability including CHF; obesity- and history of chronic abdominal pain was recently admitted to hospital for similar complaints.  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  S/p evaluation with Dr.Vaslow- diagnosed with Myaesthenia gravis.  Patient currently awaiting evaluation with [facility] in [facility] 2024]  S/p evaluation with Thoracic surgery [Dr.Lightfoot]   Bilateral feet pain not improving. Constipation with last bowel movement about 4 days ago.  Patient continues to stay at the rehab..  Review of Systems  Unable to perform ROS: Medical condition (Intellectual disability)    MEDICAL HISTORY:  Past Medical History:  Diagnosis Date   Abdominal pain 01/27/2017   Anxiety    Anxiety, generalized 05/11/2015   Cough 06/02/2016   Chronic - followed by Pulmonary   Cough 06/02/2016   Chronic - followed by Pulmonary   Depression    Developmental delay    Diverticulitis    Diverticulosis of colon 05/11/2015   Dysphagia 10/15/2015   Routine Ba Swallow normal; rec modified barium swallow    GERD (gastroesophageal reflux disease)    Hematochezia 11/07/2019   Hypertension    Hypoxia 09/12/2015   Leg swelling    Localized edema 04/15/2016   Microscopic hematuria 04/15/2016   Nausea and vomiting 01/29/2017   Orthostatic hypotension 03/16/2017   OSA on CPAP 11/18/2015   CPAP @ 18 cm H2O  started 02/2016   Rectal bleeding 10/15/2018   Urge incontinence of urine 05/12/2015    SURGICAL HISTORY: Past Surgical History:  Procedure Laterality Date   ABDOMINAL HYSTERECTOMY     BREAST BIOPSY Left    neg   COLON SURGERY     COLONOSCOPY WITH PROPOFOL N/A 12/13/2019   Procedure: COLONOSCOPY WITH PROPOFOL;  Surgeon: Lin Landsman, MD;  Location: [facility] ENDOSCOPY;  Service: Endoscopy;  Laterality: N/A;   ESOPHAGOGASTRODUODENOSCOPY (EGD) WITH PROPOFOL N/A 01/29/2017   Procedure: ESOPHAGOGASTRODUODENOSCOPY (EGD) WITH PROPOFOL;  Surgeon: Wilford Corner, MD;  Location: [facility] ENDOSCOPY;  Service: Endoscopy;  Laterality: N/A;   ESOPHAGOGASTRODUODENOSCOPY (EGD) WITH PROPOFOL N/A 08/02/2021   Procedure: ESOPHAGOGASTRODUODENOSCOPY (EGD) WITH PROPOFOL;  Surgeon: Lin Landsman, MD;  Location: [facility] ENDOSCOPY;  Service: Gastroenterology;  Laterality: N/A;    SOCIAL HISTORY: Social History   Socioeconomic History   Marital status: Single    Spouse name: Not on file   Number of children: 0   Years of education: Not on file   Highest education level: 5th grade  Occupational History    Employer: retired  Tobacco Use   Smoking status: Never   Smokeless tobacco: Never   Tobacco comments:    smoking cessation materials not required  Vaping Use   Vaping Use: Never used  Substance and Sexual Activity   Alcohol use: No    Alcohol/week: 0.0 standard drinks of alcohol   Drug use: No   Sexual activity: Never    Birth control/protection: Post-menopausal  Other Topics Concern   Not on file  Social History Narrative   Pt  lives with her niece Taylor Johnson   Social Determinants of Health   Financial Resource Strain: Low Risk  (03/04/2022)   Overall Financial Resource Strain (CARDIA)    Difficulty of Paying Living Expenses: Not hard at all  Food Insecurity: No Food Insecurity (05/10/2022)   Hunger Vital Sign    Worried About Running Out of Food in the Last Year: Never true     Ran Out of Food in the Last Year: Never true  Transportation Needs: No Transportation Needs (05/10/2022)   PRAPARE - Hydrologist (Medical): No    Lack of Transportation (Non-Medical): No  Physical Activity: Inactive (05/26/2021)   Exercise Vital Sign    Days of Exercise per Week: 0 days    Minutes of Exercise per Session: 0 min  Stress: No Stress Concern Present (05/26/2021)   [place]    Feeling of Stress : Only a little  Social Connections: Socially Isolated (05/26/2021)   Social Connection and Isolation Panel [NHANES]    Frequency of Communication with Friends and Family: More than three times a week    Frequency of Social Gatherings with Friends and Family: More than three times a week    Attends Religious Services: Never    Marine scientist or Organizations: No    Attends Archivist Meetings: Never    Marital Status: Never married  Intimate Partner Violence: Not At Risk (05/10/2022)   Humiliation, Afraid, Rape, and Kick questionnaire    Fear of Current or Ex-Partner: No    Emotionally Abused: No    Physically Abused: No    Sexually Abused: No    FAMILY HISTORY: Family History  Problem Relation Age of Onset   Hypertension Mother    Kidney disease Mother    Dementia Mother    Cancer Father        lung   Bladder Cancer Neg Hx    Breast cancer Neg Hx     ALLERGIES:  has No Known Allergies.  MEDICATIONS:  Current Outpatient Medications  Medication Sig Dispense Refill   acetaminophen (TYLENOL 8 HOUR ARTHRITIS PAIN) 650 MG CR tablet Take 650 mg by mouth every 8 (eight) hours as needed for pain.     furosemide (LASIX) 20 MG tablet Take 1 tablet (20 mg total) by mouth daily as needed for edema. 30 tablet 0   hydrALAZINE (APRESOLINE) 25 MG tablet NEW PRESCRIPTION REQUEST:: TAKE ONE TABLET BY MOUTH THREE TIMES DAILY 270 tablet 0   meclizine (ANTIVERT) 12.5 MG tablet  TAKE 1 TABLET BY MOUTH 3 TIMES DAILY AS NEEDED FOR DIZZINESS 30 tablet 0   metoCLOPramide (REGLAN) 5 MG tablet TAKE ONE TABLET BY MOUTH THREE TIMES A DAY 90 tablet 1   OLANZapine (ZYPREXA) 2.5 MG tablet Take 1 tablet (2.5 mg total) by mouth at bedtime. 90 tablet 1   pantoprazole (PROTONIX) 40 MG tablet Take 1 tablet (40 mg total) by mouth 2 (two) times daily. 60 tablet 1   Sennosides-Docusate Sodium (SENNA PLUS PO) Take 1 tablet by mouth at bedtime.     No current facility-administered medications for this visit.    PHYSICAL EXAMINATION:   Vitals:   07/20/22 1000  BP: 132/85  Pulse: (!) 103  Resp: 18  Temp: 97.9 F (36.6 C)    Filed Weights    Physical Exam Vitals and nursing note reviewed.  HENT:     Head: Normocephalic and atraumatic.  Mouth/Throat:     Pharynx: Oropharynx is clear.  Eyes:     Extraocular Movements: Extraocular movements intact.     Pupils: Pupils are equal, round, and reactive to light.  Cardiovascular:     Rate and Rhythm: Normal rate and regular rhythm.  Pulmonary:     Comments: Decreased breath sounds bilaterally.  Abdominal:     Palpations: Abdomen is soft.  Musculoskeletal:        General: Normal range of motion.     Cervical back: Normal range of motion.  Skin:    General: Skin is warm.  Neurological:     General: No focal deficit present.     Mental Status: She is alert and oriented to person, place, and time.  Psychiatric:        Behavior: Behavior normal.        Judgment: Judgment normal.     LABORATORY DATA:  I have reviewed the data as listed Lab Results  Component Value Date   WBC 11.5 (H) 06/22/2022   HGB 11.1 (L) 06/22/2022   HCT 36.8 06/22/2022   MCV 88.9 06/22/2022   PLT 320 06/22/2022   Recent Labs    04/03/22 0806 05/04/22 1225 05/05/22 0548 05/16/22 0505 05/18/22 0455 06/22/22 1225  NA 140 141   < > 136 138 134*  K 3.8 3.8   < > 4.3 4.3 3.3*  CL 105 106   < > 96* 97* 93*  CO2 27 29   < > 31 34* 31   GLUCOSE 122* 105*   < > 130* 118* 117*  BUN 30* 21   < > 33* 33* 12  CREATININE 1.00 0.96   < > 0.82 1.08* 0.70  CALCIUM 9.3 9.3   < > 9.6 9.9 9.3  GFRNONAA 59* >60   < > >60 54* >60  PROT 7.5 7.8  --   --   --  7.6  ALBUMIN 3.6 3.5  --   --   --  2.5*  AST 17 19  --   --   --  13*  ALT 12 16  --   --   --  11  ALKPHOS 73 73  --   --   --  63  BILITOT 0.4 0.6  --   --   --  0.4   < > = values in this interval not displayed.    RADIOGRAPHIC STUDIES: I have personally reviewed the radiological images as listed and agreed with the findings in the report. No results found.   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 Differential considerations include thymoma, lymphoma, or germ cell tumor. Mild tracer uptake is identified within the right hilar lymph node with SUV max of 4.06; Equivocal for nodal metastasis. No signs of tracer avid distant metastatic disease.   # Previously CT-guided biopsy-inconclusive.  Previous discussion at tumor conference-recommend considering open biopsy.  S/p evaluation thoracic surgery [place]-not a candidate for open surgery given comorbidities etc, given the more likelihood of thymoma.   # Significant limited mobility-inability to do her own ADLs at the rehab.   S/p  neurology evaluation Dr. Mickeal Skinner.  Paraneoplastic antibody panel- WNL; however acetylcholine receptor antibodies-mildly positive.  In the context of anterior mediastinal mass I think is reasonable to await further evaluation with neurology at [facility] in  March 2024.   #CHF/moderate-sized pericardial effusion -mild to moderate left pleural effusions -thoracentesis not performed lack of patient cooperation.  2D echo showed no evidence of any  cardiac tamponade- defer to PCP.    #Mild anemia hemoglobin  11 [stable] - Iron studies- sat-14%; no obvious evidence of iron deficiency.   #Intellectual disability  # DISPOSITION: # Follow up TBD- Dr.B     Above plan of care was discussed with patient/family in detail.  My contact information was given to the patient/family.       [name], MD 07/20/2022 12:58 PM

## 2022-07-20 NOTE — Assessment & Plan Note (Addendum)
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 Differential considerations include thymoma, lymphoma, or germ cell tumor. Mild tracer uptake is identified within the right hilar lymph node with SUV max of 4.06; Equivocal for nodal metastasis. No signs of tracer avid distant metastatic disease.   # Previously CT-guided biopsy-inconclusive.  Previous discussion at tumor conference-recommend considering open biopsy.  S/p evaluation thoracic surgery [place]-not a candidate for open surgery given comorbidities etc, given the more likelihood of thymoma.   # Significant limited mobility-inability to do her own ADLs at the rehab.   S/p  neurology evaluation Dr. Mickeal Skinner.  Paraneoplastic antibody panel- WNL; however acetylcholine receptor antibodies-mildly positive.  In the context of anterior mediastinal mass I think is reasonable to await further evaluation with neurology at [facility] in March 2024.   #CHF/moderate-sized pericardial effusion -mild to moderate left pleural effusions -thoracentesis not performed lack of patient cooperation.  2D echo showed no evidence of any cardiac tamponade- defer to PCP.    #Mild anemia hemoglobin  11 [stable] - Iron studies- sat-14%; no obvious evidence of iron deficiency.   #Intellectual disability  # DISPOSITION: # Follow up TBD- Dr.B  Sent messg to lung navigator regarding follow-up/update after evaluation at [facility]

## 2022-07-20 NOTE — Progress Notes (Signed)
Bilateral feet pain not improving.  Constipation with last bowel movement about 4 days ago.

## 2022-07-21 DIAGNOSIS — Z741 Need for assistance with personal care: Secondary | ICD-10-CM | POA: Diagnosis not present

## 2022-07-21 DIAGNOSIS — R2681 Unsteadiness on feet: Secondary | ICD-10-CM | POA: Diagnosis not present

## 2022-07-21 DIAGNOSIS — K5900 Constipation, unspecified: Secondary | ICD-10-CM | POA: Diagnosis not present

## 2022-07-21 DIAGNOSIS — M1000 Idiopathic gout, unspecified site: Secondary | ICD-10-CM | POA: Diagnosis not present

## 2022-07-21 DIAGNOSIS — I10 Essential (primary) hypertension: Secondary | ICD-10-CM | POA: Diagnosis not present

## 2022-07-21 DIAGNOSIS — K219 Gastro-esophageal reflux disease without esophagitis: Secondary | ICD-10-CM | POA: Diagnosis not present

## 2022-07-21 DIAGNOSIS — F01518 Vascular dementia, unspecified severity, with other behavioral disturbance: Secondary | ICD-10-CM | POA: Diagnosis not present

## 2022-07-21 DIAGNOSIS — R634 Abnormal weight loss: Secondary | ICD-10-CM | POA: Diagnosis not present

## 2022-07-21 DIAGNOSIS — R601 Generalized edema: Secondary | ICD-10-CM | POA: Diagnosis not present

## 2022-07-22 ENCOUNTER — Other Ambulatory Visit: Payer: Self-pay | Admitting: Thoracic Surgery (Cardiothoracic Vascular Surgery)

## 2022-07-22 ENCOUNTER — Ambulatory Visit (INDEPENDENT_AMBULATORY_CARE_PROVIDER_SITE_OTHER): Payer: 59 | Admitting: Thoracic Surgery (Cardiothoracic Vascular Surgery)

## 2022-07-22 DIAGNOSIS — J9859 Other diseases of mediastinum, not elsewhere classified: Secondary | ICD-10-CM | POA: Diagnosis not present

## 2022-07-22 DIAGNOSIS — F01518 Vascular dementia, unspecified severity, with other behavioral disturbance: Secondary | ICD-10-CM | POA: Diagnosis not present

## 2022-07-22 DIAGNOSIS — R2681 Unsteadiness on feet: Secondary | ICD-10-CM | POA: Diagnosis not present

## 2022-07-22 DIAGNOSIS — I10 Essential (primary) hypertension: Secondary | ICD-10-CM | POA: Diagnosis not present

## 2022-07-22 DIAGNOSIS — Z741 Need for assistance with personal care: Secondary | ICD-10-CM | POA: Diagnosis not present

## 2022-07-22 DIAGNOSIS — R601 Generalized edema: Secondary | ICD-10-CM | POA: Diagnosis not present

## 2022-07-22 DIAGNOSIS — K5900 Constipation, unspecified: Secondary | ICD-10-CM | POA: Diagnosis not present

## 2022-07-22 DIAGNOSIS — K219 Gastro-esophageal reflux disease without esophagitis: Secondary | ICD-10-CM | POA: Diagnosis not present

## 2022-07-22 DIAGNOSIS — M1000 Idiopathic gout, unspecified site: Secondary | ICD-10-CM | POA: Diagnosis not present

## 2022-07-22 NOTE — Progress Notes (Signed)
     [address]       [place], [address]             [phone]       Patient: Home Provider: Office Consent for Telemedicine visit obtained.  Today's visit was completed via a real-time telehealth (see specific modality noted below). The patient/authorized person provided oral consent at the time of the visit to engage in a telemedicine encounter with the present provider at [facility]. The patient/authorized person was informed of the potential benefits, limitations, and risks of telemedicine. The patient/authorized person expressed understanding that the laws that protect confidentiality also apply to telemedicine. The patient/authorized person acknowledged understanding that telemedicine does not provide emergency services and that he or she would need to call 911 or proceed to the nearest hospital for help if such a need arose.   Total time spent in the clinical discussion 10 minutes.  Telehealth Modality: Phone visit (audio only)  I had a telephone visit with the patient's sister Mrs. Primus Bravo.  We have placed an order for tumor markers to be performed at in [place].  I will follow-up with him next week to discuss results.

## 2022-07-23 DIAGNOSIS — F01518 Vascular dementia, unspecified severity, with other behavioral disturbance: Secondary | ICD-10-CM | POA: Diagnosis not present

## 2022-07-23 DIAGNOSIS — M1000 Idiopathic gout, unspecified site: Secondary | ICD-10-CM | POA: Diagnosis not present

## 2022-07-23 DIAGNOSIS — Z741 Need for assistance with personal care: Secondary | ICD-10-CM | POA: Diagnosis not present

## 2022-07-23 DIAGNOSIS — R601 Generalized edema: Secondary | ICD-10-CM | POA: Diagnosis not present

## 2022-07-23 DIAGNOSIS — E119 Type 2 diabetes mellitus without complications: Secondary | ICD-10-CM | POA: Diagnosis not present

## 2022-07-23 DIAGNOSIS — K219 Gastro-esophageal reflux disease without esophagitis: Secondary | ICD-10-CM | POA: Diagnosis not present

## 2022-07-23 DIAGNOSIS — K5900 Constipation, unspecified: Secondary | ICD-10-CM | POA: Diagnosis not present

## 2022-07-23 DIAGNOSIS — I10 Essential (primary) hypertension: Secondary | ICD-10-CM | POA: Diagnosis not present

## 2022-07-23 DIAGNOSIS — R2681 Unsteadiness on feet: Secondary | ICD-10-CM | POA: Diagnosis not present

## 2022-07-23 DIAGNOSIS — D649 Anemia, unspecified: Secondary | ICD-10-CM | POA: Diagnosis not present

## 2022-07-23 DIAGNOSIS — E785 Hyperlipidemia, unspecified: Secondary | ICD-10-CM | POA: Diagnosis not present

## 2022-07-23 DIAGNOSIS — E559 Vitamin D deficiency, unspecified: Secondary | ICD-10-CM | POA: Diagnosis not present

## 2022-07-25 DIAGNOSIS — K219 Gastro-esophageal reflux disease without esophagitis: Secondary | ICD-10-CM | POA: Diagnosis not present

## 2022-07-25 DIAGNOSIS — K5900 Constipation, unspecified: Secondary | ICD-10-CM | POA: Diagnosis not present

## 2022-07-25 DIAGNOSIS — I10 Essential (primary) hypertension: Secondary | ICD-10-CM | POA: Diagnosis not present

## 2022-07-25 DIAGNOSIS — Z741 Need for assistance with personal care: Secondary | ICD-10-CM | POA: Diagnosis not present

## 2022-07-25 DIAGNOSIS — F01518 Vascular dementia, unspecified severity, with other behavioral disturbance: Secondary | ICD-10-CM | POA: Diagnosis not present

## 2022-07-25 DIAGNOSIS — M1000 Idiopathic gout, unspecified site: Secondary | ICD-10-CM | POA: Diagnosis not present

## 2022-07-25 DIAGNOSIS — J9859 Other diseases of mediastinum, not elsewhere classified: Secondary | ICD-10-CM | POA: Diagnosis not present

## 2022-07-25 DIAGNOSIS — R2681 Unsteadiness on feet: Secondary | ICD-10-CM | POA: Diagnosis not present

## 2022-07-25 DIAGNOSIS — R601 Generalized edema: Secondary | ICD-10-CM | POA: Diagnosis not present

## 2022-07-26 ENCOUNTER — Telehealth: Payer: Self-pay | Admitting: Primary Care

## 2022-07-26 DIAGNOSIS — G4733 Obstructive sleep apnea (adult) (pediatric): Secondary | ICD-10-CM

## 2022-07-26 NOTE — Telephone Encounter (Signed)
[facility] Center calling to speak to Ms. Taylor Johnson about PT 's CPAP. Needs more info on Sleep Study from last year.   [place]

## 2022-07-27 DIAGNOSIS — R601 Generalized edema: Secondary | ICD-10-CM | POA: Diagnosis not present

## 2022-07-27 DIAGNOSIS — K5900 Constipation, unspecified: Secondary | ICD-10-CM | POA: Diagnosis not present

## 2022-07-27 DIAGNOSIS — K219 Gastro-esophageal reflux disease without esophagitis: Secondary | ICD-10-CM | POA: Diagnosis not present

## 2022-07-27 DIAGNOSIS — F01518 Vascular dementia, unspecified severity, with other behavioral disturbance: Secondary | ICD-10-CM | POA: Diagnosis not present

## 2022-07-27 DIAGNOSIS — R2681 Unsteadiness on feet: Secondary | ICD-10-CM | POA: Diagnosis not present

## 2022-07-27 DIAGNOSIS — R634 Abnormal weight loss: Secondary | ICD-10-CM | POA: Diagnosis not present

## 2022-07-27 DIAGNOSIS — Z741 Need for assistance with personal care: Secondary | ICD-10-CM | POA: Diagnosis not present

## 2022-07-27 DIAGNOSIS — M1000 Idiopathic gout, unspecified site: Secondary | ICD-10-CM | POA: Diagnosis not present

## 2022-07-27 DIAGNOSIS — I10 Essential (primary) hypertension: Secondary | ICD-10-CM | POA: Diagnosis not present

## 2022-07-27 NOTE — Telephone Encounter (Signed)
ATC X1 LVM for Erin at [place] to give [name] a call back

## 2022-07-28 DIAGNOSIS — I10 Essential (primary) hypertension: Secondary | ICD-10-CM | POA: Diagnosis not present

## 2022-07-28 DIAGNOSIS — K5900 Constipation, unspecified: Secondary | ICD-10-CM | POA: Diagnosis not present

## 2022-07-28 DIAGNOSIS — R2681 Unsteadiness on feet: Secondary | ICD-10-CM | POA: Diagnosis not present

## 2022-07-28 DIAGNOSIS — F01518 Vascular dementia, unspecified severity, with other behavioral disturbance: Secondary | ICD-10-CM | POA: Diagnosis not present

## 2022-07-28 DIAGNOSIS — Z741 Need for assistance with personal care: Secondary | ICD-10-CM | POA: Diagnosis not present

## 2022-07-28 DIAGNOSIS — M1000 Idiopathic gout, unspecified site: Secondary | ICD-10-CM | POA: Diagnosis not present

## 2022-07-28 DIAGNOSIS — R601 Generalized edema: Secondary | ICD-10-CM | POA: Diagnosis not present

## 2022-07-28 DIAGNOSIS — K219 Gastro-esophageal reflux disease without esophagitis: Secondary | ICD-10-CM | POA: Diagnosis not present

## 2022-07-28 NOTE — Telephone Encounter (Signed)
Called and spoke with Junie Panning from [facility] about pt. Junie Panning stated that pt had a sleep study performed 2/23 and in 3/23 that were ordered by pt's PCP. After the 2nd study, BIPAP ASV was recommended instead of CPAP therapy.  Stated to [place] that we did not have access to those studies and she said she would fax them over. Provided her with Sherry's fax number due to [name] having some issues with faxes coming through on our main fax.  Junie Panning said that pt is also needing to be referred to a different DME instead of [facility] due to pt being in a skilled care facility.  Routing this info to [place] so she can look out for the sleep studies being faxed and also routing this to [place].  Please give sleep studies to [facility] for her to review once they do come. Thanks!

## 2022-07-28 NOTE — Telephone Encounter (Signed)
Taylor Johnson, ret Paige's call back from Provider called: [facility].  Please call back. TY.    [phone]

## 2022-07-29 ENCOUNTER — Telehealth: Payer: 59 | Admitting: Thoracic Surgery (Cardiothoracic Vascular Surgery)

## 2022-07-29 DIAGNOSIS — Z741 Need for assistance with personal care: Secondary | ICD-10-CM | POA: Diagnosis not present

## 2022-07-29 DIAGNOSIS — K219 Gastro-esophageal reflux disease without esophagitis: Secondary | ICD-10-CM | POA: Diagnosis not present

## 2022-07-29 DIAGNOSIS — H02201 Unspecified lagophthalmos right upper eyelid: Secondary | ICD-10-CM | POA: Diagnosis not present

## 2022-07-29 DIAGNOSIS — M1000 Idiopathic gout, unspecified site: Secondary | ICD-10-CM | POA: Diagnosis not present

## 2022-07-29 DIAGNOSIS — K5900 Constipation, unspecified: Secondary | ICD-10-CM | POA: Diagnosis not present

## 2022-07-29 DIAGNOSIS — R601 Generalized edema: Secondary | ICD-10-CM | POA: Diagnosis not present

## 2022-07-29 DIAGNOSIS — H02139 Senile ectropion of unspecified eye, unspecified eyelid: Secondary | ICD-10-CM | POA: Diagnosis not present

## 2022-07-29 DIAGNOSIS — H2513 Age-related nuclear cataract, bilateral: Secondary | ICD-10-CM | POA: Diagnosis not present

## 2022-07-29 DIAGNOSIS — R2681 Unsteadiness on feet: Secondary | ICD-10-CM | POA: Diagnosis not present

## 2022-07-29 DIAGNOSIS — F01518 Vascular dementia, unspecified severity, with other behavioral disturbance: Secondary | ICD-10-CM | POA: Diagnosis not present

## 2022-07-29 DIAGNOSIS — I10 Essential (primary) hypertension: Secondary | ICD-10-CM | POA: Diagnosis not present

## 2022-07-29 NOTE — Telephone Encounter (Signed)
These studies are already scanned into the chart under "media" tab.

## 2022-07-30 DIAGNOSIS — R601 Generalized edema: Secondary | ICD-10-CM | POA: Diagnosis not present

## 2022-07-30 DIAGNOSIS — I10 Essential (primary) hypertension: Secondary | ICD-10-CM | POA: Diagnosis not present

## 2022-07-30 DIAGNOSIS — F01518 Vascular dementia, unspecified severity, with other behavioral disturbance: Secondary | ICD-10-CM | POA: Diagnosis not present

## 2022-07-30 DIAGNOSIS — K5900 Constipation, unspecified: Secondary | ICD-10-CM | POA: Diagnosis not present

## 2022-07-30 DIAGNOSIS — K219 Gastro-esophageal reflux disease without esophagitis: Secondary | ICD-10-CM | POA: Diagnosis not present

## 2022-07-30 DIAGNOSIS — R2681 Unsteadiness on feet: Secondary | ICD-10-CM | POA: Diagnosis not present

## 2022-07-30 DIAGNOSIS — M1000 Idiopathic gout, unspecified site: Secondary | ICD-10-CM | POA: Diagnosis not present

## 2022-07-30 DIAGNOSIS — Z741 Need for assistance with personal care: Secondary | ICD-10-CM | POA: Diagnosis not present

## 2022-07-30 LAB — TSH+FREE T4: TSH W/REFLEX TO FT4: 1.96 mIU/L (ref 0.40–4.50)

## 2022-07-30 LAB — LACTATE DEHYDROGENASE: LDH: 122 U/L (ref 120–250)

## 2022-07-30 LAB — ACETYLCHOLINE RECEPTOR, BINDING: A CHR BINDING ABS: <0.3 nmol/L

## 2022-07-30 LAB — AFP TUMOR MARKER: AFP-Tumor Marker: 1.1 ng/mL

## 2022-08-01 ENCOUNTER — Ambulatory Visit (INDEPENDENT_AMBULATORY_CARE_PROVIDER_SITE_OTHER): Payer: 59 | Admitting: Thoracic Surgery (Cardiothoracic Vascular Surgery)

## 2022-08-01 DIAGNOSIS — J9859 Other diseases of mediastinum, not elsewhere classified: Secondary | ICD-10-CM

## 2022-08-01 NOTE — Telephone Encounter (Signed)
Order placed for pt to switch to BIPAP. Nothing further needed.

## 2022-08-01 NOTE — Progress Notes (Signed)
[address]       [place] [address]             [phone]       Patient: Home Provider: Office Consent for Telemedicine visit obtained.  Today's visit was completed via a real-time telehealth (see specific modality noted below). The patient/authorized person provided oral consent at the time of the visit to engage in a telemedicine encounter with the present provider at [facility]. The patient/authorized person was informed of the potential benefits, limitations, and risks of telemedicine. The patient/authorized person expressed understanding that the laws that protect confidentiality also apply to telemedicine. The patient/authorized person acknowledged understanding that telemedicine does not provide emergency services and that he or she would need to call 911 or proceed to the nearest hospital for help if such a need arose.   Total time spent in the clinical discussion 10 minutes.  Telehealth Modality: Phone visit (audio only)  I had a telephone visit with Taylor Johnson.  I explained to her that her sister's tumor markers do not fit with a lymphoma picture.  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  I do not think that getting a percutaneous biopsy would be beneficial given the risk of spread.  Furthermore, she is quite frail, and likely would not tolerate surgery with any meaningful recovery.  She will try to work more with her sister in regards to her activity.  She will call us in the future with any needs or questions.

## 2022-08-01 NOTE — Telephone Encounter (Signed)
Reviewed-had BiPAP AVS titration on 10/08/2021.  Please place an order for BiPAP ASV Minimum EPAP 4, next min EPAP 15, pressure support 5 ResMed AirFit F20 full face mask, size medium

## 2022-08-02 DIAGNOSIS — M1000 Idiopathic gout, unspecified site: Secondary | ICD-10-CM | POA: Diagnosis not present

## 2022-08-02 DIAGNOSIS — K5900 Constipation, unspecified: Secondary | ICD-10-CM | POA: Diagnosis not present

## 2022-08-02 DIAGNOSIS — R601 Generalized edema: Secondary | ICD-10-CM | POA: Diagnosis not present

## 2022-08-02 DIAGNOSIS — Z741 Need for assistance with personal care: Secondary | ICD-10-CM | POA: Diagnosis not present

## 2022-08-02 DIAGNOSIS — R2681 Unsteadiness on feet: Secondary | ICD-10-CM | POA: Diagnosis not present

## 2022-08-02 DIAGNOSIS — F01518 Vascular dementia, unspecified severity, with other behavioral disturbance: Secondary | ICD-10-CM | POA: Diagnosis not present

## 2022-08-02 DIAGNOSIS — K219 Gastro-esophageal reflux disease without esophagitis: Secondary | ICD-10-CM | POA: Diagnosis not present

## 2022-08-02 DIAGNOSIS — I10 Essential (primary) hypertension: Secondary | ICD-10-CM | POA: Diagnosis not present

## 2022-08-03 DIAGNOSIS — Z741 Need for assistance with personal care: Secondary | ICD-10-CM | POA: Diagnosis not present

## 2022-08-03 DIAGNOSIS — M1000 Idiopathic gout, unspecified site: Secondary | ICD-10-CM | POA: Diagnosis not present

## 2022-08-03 DIAGNOSIS — I10 Essential (primary) hypertension: Secondary | ICD-10-CM | POA: Diagnosis not present

## 2022-08-03 DIAGNOSIS — R601 Generalized edema: Secondary | ICD-10-CM | POA: Diagnosis not present

## 2022-08-03 DIAGNOSIS — R2681 Unsteadiness on feet: Secondary | ICD-10-CM | POA: Diagnosis not present

## 2022-08-03 DIAGNOSIS — F01518 Vascular dementia, unspecified severity, with other behavioral disturbance: Secondary | ICD-10-CM | POA: Diagnosis not present

## 2022-08-03 DIAGNOSIS — K5900 Constipation, unspecified: Secondary | ICD-10-CM | POA: Diagnosis not present

## 2022-08-03 DIAGNOSIS — K219 Gastro-esophageal reflux disease without esophagitis: Secondary | ICD-10-CM | POA: Diagnosis not present

## 2022-08-04 DIAGNOSIS — Z741 Need for assistance with personal care: Secondary | ICD-10-CM | POA: Diagnosis not present

## 2022-08-04 DIAGNOSIS — M1000 Idiopathic gout, unspecified site: Secondary | ICD-10-CM | POA: Diagnosis not present

## 2022-08-04 DIAGNOSIS — I10 Essential (primary) hypertension: Secondary | ICD-10-CM | POA: Diagnosis not present

## 2022-08-04 DIAGNOSIS — R601 Generalized edema: Secondary | ICD-10-CM | POA: Diagnosis not present

## 2022-08-04 DIAGNOSIS — R2681 Unsteadiness on feet: Secondary | ICD-10-CM | POA: Diagnosis not present

## 2022-08-04 DIAGNOSIS — K219 Gastro-esophageal reflux disease without esophagitis: Secondary | ICD-10-CM | POA: Diagnosis not present

## 2022-08-04 DIAGNOSIS — K5900 Constipation, unspecified: Secondary | ICD-10-CM | POA: Diagnosis not present

## 2022-08-04 DIAGNOSIS — F01518 Vascular dementia, unspecified severity, with other behavioral disturbance: Secondary | ICD-10-CM | POA: Diagnosis not present

## 2022-08-05 DIAGNOSIS — I10 Essential (primary) hypertension: Secondary | ICD-10-CM | POA: Diagnosis not present

## 2022-08-05 DIAGNOSIS — F01518 Vascular dementia, unspecified severity, with other behavioral disturbance: Secondary | ICD-10-CM | POA: Diagnosis not present

## 2022-08-05 DIAGNOSIS — K219 Gastro-esophageal reflux disease without esophagitis: Secondary | ICD-10-CM | POA: Diagnosis not present

## 2022-08-05 DIAGNOSIS — M1000 Idiopathic gout, unspecified site: Secondary | ICD-10-CM | POA: Diagnosis not present

## 2022-08-05 DIAGNOSIS — K5900 Constipation, unspecified: Secondary | ICD-10-CM | POA: Diagnosis not present

## 2022-08-05 DIAGNOSIS — R2681 Unsteadiness on feet: Secondary | ICD-10-CM | POA: Diagnosis not present

## 2022-08-05 DIAGNOSIS — R601 Generalized edema: Secondary | ICD-10-CM | POA: Diagnosis not present

## 2022-08-05 DIAGNOSIS — Z741 Need for assistance with personal care: Secondary | ICD-10-CM | POA: Diagnosis not present

## 2022-08-06 DIAGNOSIS — M1000 Idiopathic gout, unspecified site: Secondary | ICD-10-CM | POA: Diagnosis not present

## 2022-08-06 DIAGNOSIS — R2681 Unsteadiness on feet: Secondary | ICD-10-CM | POA: Diagnosis not present

## 2022-08-06 DIAGNOSIS — I10 Essential (primary) hypertension: Secondary | ICD-10-CM | POA: Diagnosis not present

## 2022-08-06 DIAGNOSIS — R601 Generalized edema: Secondary | ICD-10-CM | POA: Diagnosis not present

## 2022-08-06 DIAGNOSIS — F01518 Vascular dementia, unspecified severity, with other behavioral disturbance: Secondary | ICD-10-CM | POA: Diagnosis not present

## 2022-08-06 DIAGNOSIS — Z741 Need for assistance with personal care: Secondary | ICD-10-CM | POA: Diagnosis not present

## 2022-08-06 DIAGNOSIS — K5900 Constipation, unspecified: Secondary | ICD-10-CM | POA: Diagnosis not present

## 2022-08-06 DIAGNOSIS — K219 Gastro-esophageal reflux disease without esophagitis: Secondary | ICD-10-CM | POA: Diagnosis not present

## 2022-08-09 ENCOUNTER — Ambulatory Visit: Payer: 59 | Attending: Cardiovascular Disease

## 2022-08-09 DIAGNOSIS — R601 Generalized edema: Secondary | ICD-10-CM | POA: Diagnosis not present

## 2022-08-09 DIAGNOSIS — Z741 Need for assistance with personal care: Secondary | ICD-10-CM | POA: Diagnosis not present

## 2022-08-09 DIAGNOSIS — I3139 Other pericardial effusion (noninflammatory): Secondary | ICD-10-CM

## 2022-08-09 DIAGNOSIS — M1000 Idiopathic gout, unspecified site: Secondary | ICD-10-CM | POA: Diagnosis not present

## 2022-08-09 DIAGNOSIS — I10 Essential (primary) hypertension: Secondary | ICD-10-CM | POA: Diagnosis not present

## 2022-08-09 DIAGNOSIS — F01518 Vascular dementia, unspecified severity, with other behavioral disturbance: Secondary | ICD-10-CM | POA: Diagnosis not present

## 2022-08-09 DIAGNOSIS — R2681 Unsteadiness on feet: Secondary | ICD-10-CM | POA: Diagnosis not present

## 2022-08-09 DIAGNOSIS — K219 Gastro-esophageal reflux disease without esophagitis: Secondary | ICD-10-CM | POA: Diagnosis not present

## 2022-08-09 DIAGNOSIS — K5900 Constipation, unspecified: Secondary | ICD-10-CM | POA: Diagnosis not present

## 2022-08-10 DIAGNOSIS — R601 Generalized edema: Secondary | ICD-10-CM | POA: Diagnosis not present

## 2022-08-10 DIAGNOSIS — K219 Gastro-esophageal reflux disease without esophagitis: Secondary | ICD-10-CM | POA: Diagnosis not present

## 2022-08-10 DIAGNOSIS — F01518 Vascular dementia, unspecified severity, with other behavioral disturbance: Secondary | ICD-10-CM | POA: Diagnosis not present

## 2022-08-10 DIAGNOSIS — I10 Essential (primary) hypertension: Secondary | ICD-10-CM | POA: Diagnosis not present

## 2022-08-10 DIAGNOSIS — K5900 Constipation, unspecified: Secondary | ICD-10-CM | POA: Diagnosis not present

## 2022-08-10 DIAGNOSIS — R2681 Unsteadiness on feet: Secondary | ICD-10-CM | POA: Diagnosis not present

## 2022-08-10 DIAGNOSIS — Z741 Need for assistance with personal care: Secondary | ICD-10-CM | POA: Diagnosis not present

## 2022-08-10 DIAGNOSIS — M1000 Idiopathic gout, unspecified site: Secondary | ICD-10-CM | POA: Diagnosis not present

## 2022-08-10 LAB — ECHOCARDIOGRAM COMPLETE
AV Mean grad: 6 mmHg
AV Peak grad: 14 mmHg
Ao pk vel: 1.87 m/s
Area-P 1/2: 2.97 cm2

## 2022-08-11 DIAGNOSIS — K5900 Constipation, unspecified: Secondary | ICD-10-CM | POA: Diagnosis not present

## 2022-08-11 DIAGNOSIS — R601 Generalized edema: Secondary | ICD-10-CM | POA: Diagnosis not present

## 2022-08-11 DIAGNOSIS — M1000 Idiopathic gout, unspecified site: Secondary | ICD-10-CM | POA: Diagnosis not present

## 2022-08-11 DIAGNOSIS — I10 Essential (primary) hypertension: Secondary | ICD-10-CM | POA: Diagnosis not present

## 2022-08-11 DIAGNOSIS — Z741 Need for assistance with personal care: Secondary | ICD-10-CM | POA: Diagnosis not present

## 2022-08-11 DIAGNOSIS — K219 Gastro-esophageal reflux disease without esophagitis: Secondary | ICD-10-CM | POA: Diagnosis not present

## 2022-08-11 DIAGNOSIS — F01518 Vascular dementia, unspecified severity, with other behavioral disturbance: Secondary | ICD-10-CM | POA: Diagnosis not present

## 2022-08-11 DIAGNOSIS — R2681 Unsteadiness on feet: Secondary | ICD-10-CM | POA: Diagnosis not present

## 2022-08-12 DIAGNOSIS — M1000 Idiopathic gout, unspecified site: Secondary | ICD-10-CM | POA: Diagnosis not present

## 2022-08-12 DIAGNOSIS — F01518 Vascular dementia, unspecified severity, with other behavioral disturbance: Secondary | ICD-10-CM | POA: Diagnosis not present

## 2022-08-12 DIAGNOSIS — R2681 Unsteadiness on feet: Secondary | ICD-10-CM | POA: Diagnosis not present

## 2022-08-12 DIAGNOSIS — I10 Essential (primary) hypertension: Secondary | ICD-10-CM | POA: Diagnosis not present

## 2022-08-12 DIAGNOSIS — K219 Gastro-esophageal reflux disease without esophagitis: Secondary | ICD-10-CM | POA: Diagnosis not present

## 2022-08-12 DIAGNOSIS — R601 Generalized edema: Secondary | ICD-10-CM | POA: Diagnosis not present

## 2022-08-12 DIAGNOSIS — Z741 Need for assistance with personal care: Secondary | ICD-10-CM | POA: Diagnosis not present

## 2022-08-12 DIAGNOSIS — K5900 Constipation, unspecified: Secondary | ICD-10-CM | POA: Diagnosis not present

## 2022-08-13 DIAGNOSIS — M1000 Idiopathic gout, unspecified site: Secondary | ICD-10-CM | POA: Diagnosis not present

## 2022-08-13 DIAGNOSIS — R2681 Unsteadiness on feet: Secondary | ICD-10-CM | POA: Diagnosis not present

## 2022-08-13 DIAGNOSIS — K5900 Constipation, unspecified: Secondary | ICD-10-CM | POA: Diagnosis not present

## 2022-08-13 DIAGNOSIS — Z741 Need for assistance with personal care: Secondary | ICD-10-CM | POA: Diagnosis not present

## 2022-08-13 DIAGNOSIS — F01518 Vascular dementia, unspecified severity, with other behavioral disturbance: Secondary | ICD-10-CM | POA: Diagnosis not present

## 2022-08-13 DIAGNOSIS — I10 Essential (primary) hypertension: Secondary | ICD-10-CM | POA: Diagnosis not present

## 2022-08-13 DIAGNOSIS — K219 Gastro-esophageal reflux disease without esophagitis: Secondary | ICD-10-CM | POA: Diagnosis not present

## 2022-08-13 DIAGNOSIS — R601 Generalized edema: Secondary | ICD-10-CM | POA: Diagnosis not present

## 2022-08-18 ENCOUNTER — Encounter: Payer: Self-pay | Admitting: *Deleted

## 2022-08-25 DIAGNOSIS — K5900 Constipation, unspecified: Secondary | ICD-10-CM | POA: Diagnosis not present

## 2022-09-01 DIAGNOSIS — I10 Essential (primary) hypertension: Secondary | ICD-10-CM | POA: Diagnosis not present

## 2022-09-01 DIAGNOSIS — R634 Abnormal weight loss: Secondary | ICD-10-CM | POA: Diagnosis not present

## 2022-09-01 DIAGNOSIS — R600 Localized edema: Secondary | ICD-10-CM | POA: Diagnosis not present

## 2022-09-01 DIAGNOSIS — K5900 Constipation, unspecified: Secondary | ICD-10-CM | POA: Diagnosis not present

## 2022-09-07 DIAGNOSIS — Z736 Limitation of activities due to disability: Secondary | ICD-10-CM | POA: Diagnosis not present

## 2022-09-07 DIAGNOSIS — I10 Essential (primary) hypertension: Secondary | ICD-10-CM | POA: Diagnosis not present

## 2022-09-07 DIAGNOSIS — K219 Gastro-esophageal reflux disease without esophagitis: Secondary | ICD-10-CM | POA: Diagnosis not present

## 2022-09-07 DIAGNOSIS — Z741 Need for assistance with personal care: Secondary | ICD-10-CM | POA: Diagnosis not present

## 2022-09-07 DIAGNOSIS — R1312 Dysphagia, oropharyngeal phase: Secondary | ICD-10-CM | POA: Diagnosis not present

## 2022-09-07 DIAGNOSIS — M6259 Muscle wasting and atrophy, not elsewhere classified, multiple sites: Secondary | ICD-10-CM | POA: Diagnosis not present

## 2022-09-07 DIAGNOSIS — K5900 Constipation, unspecified: Secondary | ICD-10-CM | POA: Diagnosis not present

## 2022-09-07 DIAGNOSIS — F01518 Vascular dementia, unspecified severity, with other behavioral disturbance: Secondary | ICD-10-CM | POA: Diagnosis not present

## 2022-09-07 DIAGNOSIS — M1000 Idiopathic gout, unspecified site: Secondary | ICD-10-CM | POA: Diagnosis not present

## 2022-09-07 DIAGNOSIS — R601 Generalized edema: Secondary | ICD-10-CM | POA: Diagnosis not present

## 2022-09-08 DIAGNOSIS — K5900 Constipation, unspecified: Secondary | ICD-10-CM | POA: Diagnosis not present

## 2022-09-08 DIAGNOSIS — M1000 Idiopathic gout, unspecified site: Secondary | ICD-10-CM | POA: Diagnosis not present

## 2022-09-08 DIAGNOSIS — Z741 Need for assistance with personal care: Secondary | ICD-10-CM | POA: Diagnosis not present

## 2022-09-08 DIAGNOSIS — K219 Gastro-esophageal reflux disease without esophagitis: Secondary | ICD-10-CM | POA: Diagnosis not present

## 2022-09-08 DIAGNOSIS — Z736 Limitation of activities due to disability: Secondary | ICD-10-CM | POA: Diagnosis not present

## 2022-09-08 DIAGNOSIS — R1312 Dysphagia, oropharyngeal phase: Secondary | ICD-10-CM | POA: Diagnosis not present

## 2022-09-08 DIAGNOSIS — F01518 Vascular dementia, unspecified severity, with other behavioral disturbance: Secondary | ICD-10-CM | POA: Diagnosis not present

## 2022-09-08 DIAGNOSIS — R601 Generalized edema: Secondary | ICD-10-CM | POA: Diagnosis not present

## 2022-09-08 DIAGNOSIS — I10 Essential (primary) hypertension: Secondary | ICD-10-CM | POA: Diagnosis not present

## 2022-09-08 DIAGNOSIS — M6259 Muscle wasting and atrophy, not elsewhere classified, multiple sites: Secondary | ICD-10-CM | POA: Diagnosis not present

## 2022-09-09 ENCOUNTER — Ambulatory Visit: Payer: 59 | Attending: Cardiovascular Disease | Admitting: Cardiovascular Disease

## 2022-09-09 ENCOUNTER — Encounter: Payer: Self-pay | Admitting: Cardiovascular Disease

## 2022-09-09 VITALS — BP 110/60 | HR 81 | Ht 64.0 in | Wt 189.1 lb

## 2022-09-09 DIAGNOSIS — I3139 Other pericardial effusion (noninflammatory): Secondary | ICD-10-CM | POA: Diagnosis not present

## 2022-09-09 DIAGNOSIS — I340 Nonrheumatic mitral (valve) insufficiency: Secondary | ICD-10-CM | POA: Diagnosis not present

## 2022-09-09 DIAGNOSIS — Z736 Limitation of activities due to disability: Secondary | ICD-10-CM | POA: Diagnosis not present

## 2022-09-09 DIAGNOSIS — R1312 Dysphagia, oropharyngeal phase: Secondary | ICD-10-CM | POA: Diagnosis not present

## 2022-09-09 DIAGNOSIS — M6259 Muscle wasting and atrophy, not elsewhere classified, multiple sites: Secondary | ICD-10-CM | POA: Diagnosis not present

## 2022-09-09 DIAGNOSIS — K5900 Constipation, unspecified: Secondary | ICD-10-CM | POA: Diagnosis not present

## 2022-09-09 DIAGNOSIS — F01518 Vascular dementia, unspecified severity, with other behavioral disturbance: Secondary | ICD-10-CM | POA: Diagnosis not present

## 2022-09-09 DIAGNOSIS — M1000 Idiopathic gout, unspecified site: Secondary | ICD-10-CM | POA: Diagnosis not present

## 2022-09-09 DIAGNOSIS — I10 Essential (primary) hypertension: Secondary | ICD-10-CM | POA: Diagnosis not present

## 2022-09-09 DIAGNOSIS — I5032 Chronic diastolic (congestive) heart failure: Secondary | ICD-10-CM

## 2022-09-09 DIAGNOSIS — Z741 Need for assistance with personal care: Secondary | ICD-10-CM | POA: Diagnosis not present

## 2022-09-09 DIAGNOSIS — K219 Gastro-esophageal reflux disease without esophagitis: Secondary | ICD-10-CM | POA: Diagnosis not present

## 2022-09-09 DIAGNOSIS — R601 Generalized edema: Secondary | ICD-10-CM | POA: Diagnosis not present

## 2022-09-09 NOTE — Patient Instructions (Signed)
Medication Instructions:   Your physician recommends that you continue on your current medications as directed. Please refer to the Current Medication list given to you today.  *If you need a refill on your cardiac medications before your next appointment, please call your pharmacy*   Lab Work:  NONE  If you have labs (blood work) drawn today and your tests are completely normal, you will receive your results only by: [place] (if you have MyChart) OR A paper copy in the mail If you have any lab test that is abnormal or we need to change your treatment, we will call you to review the results.   Testing/Procedures:  NONE   Follow-Up: At [facility], you and your health needs are our priority.  As part of our continuing mission to provide you with exceptional heart care, we have created designated Provider Care Teams.  These Care Teams include your primary Cardiologist (physician) and Advanced Practice Providers (APPs -  Physician Assistants and Nurse Practitioners) who all work together to provide you with the care you need, when you need it.  We recommend signing up for the patient portal called "MyChart".  Sign up information is provided on this After Visit Summary.  MyChart is used to connect with patients for Virtual Visits (Telemedicine).  Patients are able to view lab/test results, encounter notes, upcoming appointments, etc.  Non-urgent messages can be sent to your provider as well.   To learn more about what you can do with MyChart, go to [email or web address].    Your next appointment:   6  month(s)  Provider:   You may see Kathlyn Sacramento, MD or one of the following Advanced Practice Providers on your designated Care Team:   Murray Hodgkins, NP Christell Faith, PA-C Cadence Kathlen Mody, PA-C Gerrie Nordmann, NP

## 2022-09-09 NOTE — Progress Notes (Signed)
Cardiology Office Note   Date:  09/09/2022   ID:  Taylor Johnson, Taylor Johnson May 23, 1946, MRN [id number]  PCP:  [name], MD  Cardiologist:   Kathlyn Sacramento, MD   Chief Complaint  Patient presents with   OTHER    3 month f/u no complaints today. Meds reviewed verbally with pt.      History of Present Illness: Taylor Johnson is a 77 y.o. female who is here today for follow-up visit regarding chronic diastolic heart failure and moderate pulmonary hypertension.   Echocardiogram last year  showed normal LV systolic function, moderate pulmonary hypertension and mild to moderate mitral regurgitation. She has chronic medical conditions that include developmental delay, mediastinal mass, asthma, sleep apnea on CPAP, essential hypertension She was seen by me in 2018 for exertional dyspnea.  She underwent a Lexiscan Myoview that showed no evidence of ischemia.    She was hospitalized in October 2023 with chest pain.  CT chest showed mediastinal mass, pleural effusion and small to moderate pericardial and pleural effusion.  Thoracentesis was attempted but not successful due to patient's agitation. She was hospitalized again in early November  with worsening shortness of breath and lower extremity edema.  BNP was mildly elevated at 134.  She was noted to be mildly hypoxic.  Echocardiogram showed normal LV systolic function with small to moderate pericardial effusion.  She improved with diuresis. CT-guided biopsy of mediastinal mass was nondiagnostic.  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 [facility] continues to stay [facility] and has been doing reasonably well with no chest pain, shortness of breath or recurrent lower extremity edema.  She is on furosemide as needed. She underwent a repeat echocardiogram in late January which showed normal LV systolic function with resolution of pericardial effusion.    Past Medical  History:  Diagnosis Date   Abdominal pain 01/27/2017   Anxiety    Anxiety, generalized 05/11/2015   Cough 06/02/2016   Chronic - followed by Pulmonary   Cough 06/02/2016   Chronic - followed by Pulmonary   Depression    Developmental delay    Diverticulitis    Diverticulosis of colon 05/11/2015   Dysphagia 10/15/2015   Routine Ba Swallow normal; rec modified barium swallow    GERD (gastroesophageal reflux disease)    Hematochezia 11/07/2019   Hypertension    Hypoxia 09/12/2015   Leg swelling    Localized edema 04/15/2016   Microscopic hematuria 04/15/2016   Nausea and vomiting 01/29/2017   Orthostatic hypotension 03/16/2017   OSA on CPAP 11/18/2015   CPAP @ 18 cm H2O started 02/2016   Rectal bleeding 10/15/2018   Urge incontinence of urine 05/12/2015    Past Surgical History:  Procedure Laterality Date   ABDOMINAL HYSTERECTOMY     BREAST BIOPSY Left    neg   COLON SURGERY     COLONOSCOPY WITH PROPOFOL N/A 12/13/2019   Procedure: COLONOSCOPY WITH PROPOFOL;  Surgeon: Lin Landsman, MD;  Location: [facility];  Service: Endoscopy;  Laterality: N/A;   ESOPHAGOGASTRODUODENOSCOPY (EGD) WITH PROPOFOL N/A 01/29/2017   Procedure: ESOPHAGOGASTRODUODENOSCOPY (EGD) WITH PROPOFOL;  Surgeon: Wilford Corner, MD;  Location: [facility] ENDOSCOPY;  Service: Endoscopy;  Laterality: N/A;   ESOPHAGOGASTRODUODENOSCOPY (EGD) WITH PROPOFOL N/A 08/02/2021   Procedure: ESOPHAGOGASTRODUODENOSCOPY (EGD) WITH PROPOFOL;  Surgeon: Lin Landsman, MD;  Location: [facility] ENDOSCOPY;  Service: Gastroenterology;  Laterality: N/A;  Current Outpatient Medications  Medication Sig Dispense Refill   acetaminophen (TYLENOL 8 HOUR ARTHRITIS PAIN) 650 MG CR tablet Take 650 mg by mouth every 8 (eight) hours as needed for pain.     Amino Acids-Protein Hydrolys (PRO-STAT PO) Take by mouth as directed.     bisacodyl (DULCOLAX) 10 MG suppository Place 10 mg rectally as needed for moderate constipation.      furosemide (LASIX) 20 MG tablet Take 1 tablet (20 mg total) by mouth daily as needed for edema. 30 tablet 0   hydrALAZINE (APRESOLINE) 25 MG tablet NEW PRESCRIPTION REQUEST:: TAKE ONE TABLET BY MOUTH THREE TIMES DAILY 270 tablet 0   meclizine (ANTIVERT) 12.5 MG tablet TAKE 1 TABLET BY MOUTH 3 TIMES DAILY AS NEEDED FOR DIZZINESS 30 tablet 0   metoCLOPramide (REGLAN) 5 MG tablet TAKE ONE TABLET BY MOUTH THREE TIMES A DAY 90 tablet 1   OLANZapine (ZYPREXA) 2.5 MG tablet Take 1 tablet (2.5 mg total) by mouth at bedtime. 90 tablet 1   Sennosides-Docusate Sodium (SENNA PLUS PO) Take 1 tablet by mouth at bedtime.     pantoprazole (PROTONIX) 40 MG tablet Take 1 tablet (40 mg total) by mouth 2 (two) times daily. 60 tablet 1   No current facility-administered medications for this visit.    Allergies:   Patient has no known allergies.    Social History:  The patient  reports that she has never smoked. She has never used smokeless tobacco. She reports that she does not drink alcohol and does not use drugs.   Family History:  The patient's family history includes Cancer in her father; Dementia in her mother; Hypertension in her mother; Kidney disease in her mother.    ROS:  Please see the history of present illness.   Otherwise, review of systems are positive for none.   All other systems are reviewed and negative.    PHYSICAL EXAM: VS:  BP 110/60 (BP Location: Right Arm, Patient Position: Sitting, Cuff Size: Large)   Pulse 81   Ht '5\' 4"'$  (1.626 m)   Wt 189 lb 1 oz (85.8 kg)   SpO2 94%   BMI 32.45 kg/m  , BMI Body mass index is 32.45 kg/m. GEN: Well nourished, well developed, in no acute distress  HEENT: normal  Neck: no JVD, carotid bruits, or masses Cardiac: RRR; no murmurs, rubs, or gallops,no edema  Respiratory:  clear to auscultation bilaterally, normal work of breathing GI: soft, nontender, nondistended, + BS MS: no deformity or atrophy  Skin: warm and dry, no rash Neuro:  Strength and  sensation are intact Psych: euthymic mood, full affect   EKG:  EKG is not ordered today.    Recent Labs: 05/10/2022: B Natriuretic Peptide 134.8 05/18/2022: Magnesium 2.4 06/22/2022: ALT 11; BUN 12; Creatinine, Ser 0.70; Hemoglobin 11.1; Platelets 320; Potassium 3.3; Sodium 134    Lipid Panel    Component Value Date/Time   CHOL 163 02/05/2019 0154   CHOL 157 06/02/2016 1034   TRIG 72 02/05/2019 0154   HDL 63 02/05/2019 0154   HDL 61 06/02/2016 1034   CHOLHDL 2.6 02/05/2019 0154   VLDL 14 02/05/2019 0154   LDLCALC 86 02/05/2019 0154   LDLCALC 80 06/02/2016 1034      Wt Readings from Last 3 Encounters:  09/09/22 189 lb 1 oz (85.8 kg)  07/15/22 202 lb (91.6 kg)  06/08/22 202 lb 4 oz (91.7 kg)          06/08/2022    9:21  AM  PAD Screen  Previous PAD dx? No  Previous surgical procedure? No  Pain with walking? No  Feet/toe relief with dangling? No  Painful, non-healing ulcers? No  Extremities discolored? No      ASSESSMENT AND PLAN:  1.  Chronic diastolic heart failure with moderate pulmonary hypertension: No evidence of volume overload at this time.  Continue furosemide as needed.  She continues to lose weight due to the creased oral intake.  2.  Pericardial effusion: Repeat echocardiogram in January showed resolution of this.  3.  Mild to moderate mitral regurgitation: There was only minimal regurgitation on most recent echocardiogram.  4.  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    Disposition:   FU with me in 6 months  Signed,  Kathlyn Sacramento, MD  09/09/2022 10:27 AM    [place]

## 2022-09-12 DIAGNOSIS — F01518 Vascular dementia, unspecified severity, with other behavioral disturbance: Secondary | ICD-10-CM | POA: Diagnosis not present

## 2022-09-12 DIAGNOSIS — Z741 Need for assistance with personal care: Secondary | ICD-10-CM | POA: Diagnosis not present

## 2022-09-12 DIAGNOSIS — M6259 Muscle wasting and atrophy, not elsewhere classified, multiple sites: Secondary | ICD-10-CM | POA: Diagnosis not present

## 2022-09-12 DIAGNOSIS — K219 Gastro-esophageal reflux disease without esophagitis: Secondary | ICD-10-CM | POA: Diagnosis not present

## 2022-09-12 DIAGNOSIS — R1312 Dysphagia, oropharyngeal phase: Secondary | ICD-10-CM | POA: Diagnosis not present

## 2022-09-12 DIAGNOSIS — Z736 Limitation of activities due to disability: Secondary | ICD-10-CM | POA: Diagnosis not present

## 2022-09-12 DIAGNOSIS — K5900 Constipation, unspecified: Secondary | ICD-10-CM | POA: Diagnosis not present

## 2022-09-12 DIAGNOSIS — M1000 Idiopathic gout, unspecified site: Secondary | ICD-10-CM | POA: Diagnosis not present

## 2022-09-12 DIAGNOSIS — R601 Generalized edema: Secondary | ICD-10-CM | POA: Diagnosis not present

## 2022-09-12 DIAGNOSIS — I10 Essential (primary) hypertension: Secondary | ICD-10-CM | POA: Diagnosis not present

## 2022-09-13 DIAGNOSIS — Z736 Limitation of activities due to disability: Secondary | ICD-10-CM | POA: Diagnosis not present

## 2022-09-13 DIAGNOSIS — F01518 Vascular dementia, unspecified severity, with other behavioral disturbance: Secondary | ICD-10-CM | POA: Diagnosis not present

## 2022-09-13 DIAGNOSIS — M1000 Idiopathic gout, unspecified site: Secondary | ICD-10-CM | POA: Diagnosis not present

## 2022-09-13 DIAGNOSIS — Z741 Need for assistance with personal care: Secondary | ICD-10-CM | POA: Diagnosis not present

## 2022-09-13 DIAGNOSIS — R601 Generalized edema: Secondary | ICD-10-CM | POA: Diagnosis not present

## 2022-09-13 DIAGNOSIS — K219 Gastro-esophageal reflux disease without esophagitis: Secondary | ICD-10-CM | POA: Diagnosis not present

## 2022-09-13 DIAGNOSIS — I10 Essential (primary) hypertension: Secondary | ICD-10-CM | POA: Diagnosis not present

## 2022-09-13 DIAGNOSIS — K5900 Constipation, unspecified: Secondary | ICD-10-CM | POA: Diagnosis not present

## 2022-09-13 DIAGNOSIS — R1312 Dysphagia, oropharyngeal phase: Secondary | ICD-10-CM | POA: Diagnosis not present

## 2022-09-13 DIAGNOSIS — M6259 Muscle wasting and atrophy, not elsewhere classified, multiple sites: Secondary | ICD-10-CM | POA: Diagnosis not present

## 2022-09-14 DIAGNOSIS — R1312 Dysphagia, oropharyngeal phase: Secondary | ICD-10-CM | POA: Diagnosis not present

## 2022-09-14 DIAGNOSIS — M1000 Idiopathic gout, unspecified site: Secondary | ICD-10-CM | POA: Diagnosis not present

## 2022-09-14 DIAGNOSIS — Z736 Limitation of activities due to disability: Secondary | ICD-10-CM | POA: Diagnosis not present

## 2022-09-14 DIAGNOSIS — I10 Essential (primary) hypertension: Secondary | ICD-10-CM | POA: Diagnosis not present

## 2022-09-14 DIAGNOSIS — K219 Gastro-esophageal reflux disease without esophagitis: Secondary | ICD-10-CM | POA: Diagnosis not present

## 2022-09-14 DIAGNOSIS — K5900 Constipation, unspecified: Secondary | ICD-10-CM | POA: Diagnosis not present

## 2022-09-14 DIAGNOSIS — F01518 Vascular dementia, unspecified severity, with other behavioral disturbance: Secondary | ICD-10-CM | POA: Diagnosis not present

## 2022-09-14 DIAGNOSIS — M6259 Muscle wasting and atrophy, not elsewhere classified, multiple sites: Secondary | ICD-10-CM | POA: Diagnosis not present

## 2022-09-14 DIAGNOSIS — Z741 Need for assistance with personal care: Secondary | ICD-10-CM | POA: Diagnosis not present

## 2022-09-14 DIAGNOSIS — R601 Generalized edema: Secondary | ICD-10-CM | POA: Diagnosis not present

## 2022-09-15 DIAGNOSIS — R601 Generalized edema: Secondary | ICD-10-CM | POA: Diagnosis not present

## 2022-09-15 DIAGNOSIS — I10 Essential (primary) hypertension: Secondary | ICD-10-CM | POA: Diagnosis not present

## 2022-09-15 DIAGNOSIS — M6259 Muscle wasting and atrophy, not elsewhere classified, multiple sites: Secondary | ICD-10-CM | POA: Diagnosis not present

## 2022-09-15 DIAGNOSIS — Z736 Limitation of activities due to disability: Secondary | ICD-10-CM | POA: Diagnosis not present

## 2022-09-15 DIAGNOSIS — K5900 Constipation, unspecified: Secondary | ICD-10-CM | POA: Diagnosis not present

## 2022-09-15 DIAGNOSIS — M1000 Idiopathic gout, unspecified site: Secondary | ICD-10-CM | POA: Diagnosis not present

## 2022-09-15 DIAGNOSIS — R1312 Dysphagia, oropharyngeal phase: Secondary | ICD-10-CM | POA: Diagnosis not present

## 2022-09-15 DIAGNOSIS — F01518 Vascular dementia, unspecified severity, with other behavioral disturbance: Secondary | ICD-10-CM | POA: Diagnosis not present

## 2022-09-15 DIAGNOSIS — K219 Gastro-esophageal reflux disease without esophagitis: Secondary | ICD-10-CM | POA: Diagnosis not present

## 2022-09-15 DIAGNOSIS — Z741 Need for assistance with personal care: Secondary | ICD-10-CM | POA: Diagnosis not present

## 2022-09-16 DIAGNOSIS — Z736 Limitation of activities due to disability: Secondary | ICD-10-CM | POA: Diagnosis not present

## 2022-09-16 DIAGNOSIS — I10 Essential (primary) hypertension: Secondary | ICD-10-CM | POA: Diagnosis not present

## 2022-09-19 DIAGNOSIS — Z736 Limitation of activities due to disability: Secondary | ICD-10-CM | POA: Diagnosis not present

## 2022-09-19 DIAGNOSIS — I10 Essential (primary) hypertension: Secondary | ICD-10-CM | POA: Diagnosis not present

## 2022-09-20 DIAGNOSIS — I10 Essential (primary) hypertension: Secondary | ICD-10-CM | POA: Diagnosis not present

## 2022-09-20 DIAGNOSIS — Z736 Limitation of activities due to disability: Secondary | ICD-10-CM | POA: Diagnosis not present

## 2022-09-21 DIAGNOSIS — Z736 Limitation of activities due to disability: Secondary | ICD-10-CM | POA: Diagnosis not present

## 2022-09-21 DIAGNOSIS — I10 Essential (primary) hypertension: Secondary | ICD-10-CM | POA: Diagnosis not present

## 2022-09-22 DIAGNOSIS — G7000 Myasthenia gravis without (acute) exacerbation: Secondary | ICD-10-CM | POA: Diagnosis not present

## 2022-09-22 DIAGNOSIS — H02402 Unspecified ptosis of left eyelid: Secondary | ICD-10-CM | POA: Diagnosis not present

## 2022-09-22 DIAGNOSIS — I272 Pulmonary hypertension, unspecified: Secondary | ICD-10-CM | POA: Diagnosis not present

## 2022-09-22 DIAGNOSIS — G6289 Other specified polyneuropathies: Secondary | ICD-10-CM | POA: Diagnosis not present

## 2022-09-22 DIAGNOSIS — G8929 Other chronic pain: Secondary | ICD-10-CM | POA: Diagnosis not present

## 2022-09-22 DIAGNOSIS — G5622 Lesion of ulnar nerve, left upper limb: Secondary | ICD-10-CM | POA: Diagnosis not present

## 2022-09-22 DIAGNOSIS — I509 Heart failure, unspecified: Secondary | ICD-10-CM | POA: Diagnosis not present

## 2022-09-22 DIAGNOSIS — R109 Unspecified abdominal pain: Secondary | ICD-10-CM | POA: Diagnosis not present

## 2022-09-23 DIAGNOSIS — I10 Essential (primary) hypertension: Secondary | ICD-10-CM | POA: Diagnosis not present

## 2022-09-23 DIAGNOSIS — Z736 Limitation of activities due to disability: Secondary | ICD-10-CM | POA: Diagnosis not present

## 2022-09-26 DIAGNOSIS — I10 Essential (primary) hypertension: Secondary | ICD-10-CM | POA: Diagnosis not present

## 2022-09-26 DIAGNOSIS — Z736 Limitation of activities due to disability: Secondary | ICD-10-CM | POA: Diagnosis not present

## 2022-09-27 DIAGNOSIS — Z736 Limitation of activities due to disability: Secondary | ICD-10-CM | POA: Diagnosis not present

## 2022-09-27 DIAGNOSIS — I10 Essential (primary) hypertension: Secondary | ICD-10-CM | POA: Diagnosis not present

## 2022-09-28 DIAGNOSIS — Z736 Limitation of activities due to disability: Secondary | ICD-10-CM | POA: Diagnosis not present

## 2022-09-28 DIAGNOSIS — I10 Essential (primary) hypertension: Secondary | ICD-10-CM | POA: Diagnosis not present

## 2022-09-29 DIAGNOSIS — Z736 Limitation of activities due to disability: Secondary | ICD-10-CM | POA: Diagnosis not present

## 2022-09-29 DIAGNOSIS — I10 Essential (primary) hypertension: Secondary | ICD-10-CM | POA: Diagnosis not present

## 2022-09-30 DIAGNOSIS — Z736 Limitation of activities due to disability: Secondary | ICD-10-CM | POA: Diagnosis not present

## 2022-09-30 DIAGNOSIS — I10 Essential (primary) hypertension: Secondary | ICD-10-CM | POA: Diagnosis not present

## 2022-10-03 DIAGNOSIS — Z736 Limitation of activities due to disability: Secondary | ICD-10-CM | POA: Diagnosis not present

## 2022-10-03 DIAGNOSIS — I10 Essential (primary) hypertension: Secondary | ICD-10-CM | POA: Diagnosis not present

## 2022-10-24 DIAGNOSIS — I10 Essential (primary) hypertension: Secondary | ICD-10-CM | POA: Diagnosis not present

## 2022-10-24 DIAGNOSIS — R109 Unspecified abdominal pain: Secondary | ICD-10-CM | POA: Diagnosis not present

## 2022-10-24 DIAGNOSIS — R634 Abnormal weight loss: Secondary | ICD-10-CM | POA: Diagnosis not present

## 2022-10-24 DIAGNOSIS — D649 Anemia, unspecified: Secondary | ICD-10-CM | POA: Diagnosis not present

## 2022-10-24 DIAGNOSIS — E119 Type 2 diabetes mellitus without complications: Secondary | ICD-10-CM | POA: Diagnosis not present

## 2022-10-25 DIAGNOSIS — R10819 Abdominal tenderness, unspecified site: Secondary | ICD-10-CM | POA: Diagnosis not present

## 2022-10-25 DIAGNOSIS — D509 Iron deficiency anemia, unspecified: Secondary | ICD-10-CM | POA: Diagnosis not present

## 2022-10-25 DIAGNOSIS — R1900 Intra-abdominal and pelvic swelling, mass and lump, unspecified site: Secondary | ICD-10-CM | POA: Diagnosis not present

## 2022-10-25 DIAGNOSIS — D529 Folate deficiency anemia, unspecified: Secondary | ICD-10-CM | POA: Diagnosis not present

## 2022-10-25 DIAGNOSIS — K5900 Constipation, unspecified: Secondary | ICD-10-CM | POA: Diagnosis not present

## 2022-10-25 DIAGNOSIS — R195 Other fecal abnormalities: Secondary | ICD-10-CM | POA: Diagnosis not present

## 2022-10-28 DIAGNOSIS — D508 Other iron deficiency anemias: Secondary | ICD-10-CM | POA: Diagnosis not present

## 2022-12-09 DIAGNOSIS — M6259 Muscle wasting and atrophy, not elsewhere classified, multiple sites: Secondary | ICD-10-CM | POA: Diagnosis not present

## 2022-12-22 DIAGNOSIS — D509 Iron deficiency anemia, unspecified: Secondary | ICD-10-CM | POA: Diagnosis not present

## 2022-12-22 DIAGNOSIS — K5900 Constipation, unspecified: Secondary | ICD-10-CM | POA: Diagnosis not present

## 2022-12-22 DIAGNOSIS — M6281 Muscle weakness (generalized): Secondary | ICD-10-CM | POA: Diagnosis not present

## 2022-12-22 DIAGNOSIS — I10 Essential (primary) hypertension: Secondary | ICD-10-CM | POA: Diagnosis not present

## 2022-12-23 DIAGNOSIS — E559 Vitamin D deficiency, unspecified: Secondary | ICD-10-CM | POA: Diagnosis not present

## 2022-12-23 DIAGNOSIS — E569 Vitamin deficiency, unspecified: Secondary | ICD-10-CM | POA: Diagnosis not present

## 2022-12-23 DIAGNOSIS — I10 Essential (primary) hypertension: Secondary | ICD-10-CM | POA: Diagnosis not present

## 2022-12-26 DIAGNOSIS — M6281 Muscle weakness (generalized): Secondary | ICD-10-CM | POA: Diagnosis not present

## 2022-12-26 DIAGNOSIS — E559 Vitamin D deficiency, unspecified: Secondary | ICD-10-CM | POA: Diagnosis not present

## 2023-02-08 DIAGNOSIS — M6281 Muscle weakness (generalized): Secondary | ICD-10-CM | POA: Diagnosis not present

## 2023-02-08 DIAGNOSIS — I10 Essential (primary) hypertension: Secondary | ICD-10-CM | POA: Diagnosis not present

## 2023-02-20 DIAGNOSIS — I10 Essential (primary) hypertension: Secondary | ICD-10-CM | POA: Diagnosis not present

## 2023-02-20 DIAGNOSIS — D509 Iron deficiency anemia, unspecified: Secondary | ICD-10-CM | POA: Diagnosis not present

## 2023-02-20 DIAGNOSIS — K5900 Constipation, unspecified: Secondary | ICD-10-CM | POA: Diagnosis not present

## 2023-02-20 DIAGNOSIS — E559 Vitamin D deficiency, unspecified: Secondary | ICD-10-CM | POA: Diagnosis not present

## 2023-02-20 DIAGNOSIS — M6281 Muscle weakness (generalized): Secondary | ICD-10-CM | POA: Diagnosis not present

## 2023-02-27 DIAGNOSIS — E559 Vitamin D deficiency, unspecified: Secondary | ICD-10-CM | POA: Diagnosis not present

## 2023-02-28 DIAGNOSIS — M6259 Muscle wasting and atrophy, not elsewhere classified, multiple sites: Secondary | ICD-10-CM | POA: Diagnosis not present

## 2023-03-03 DIAGNOSIS — M6259 Muscle wasting and atrophy, not elsewhere classified, multiple sites: Secondary | ICD-10-CM | POA: Diagnosis not present

## 2023-03-04 DIAGNOSIS — M6259 Muscle wasting and atrophy, not elsewhere classified, multiple sites: Secondary | ICD-10-CM | POA: Diagnosis not present

## 2023-03-05 DIAGNOSIS — M6259 Muscle wasting and atrophy, not elsewhere classified, multiple sites: Secondary | ICD-10-CM | POA: Diagnosis not present

## 2023-03-06 DIAGNOSIS — M6259 Muscle wasting and atrophy, not elsewhere classified, multiple sites: Secondary | ICD-10-CM | POA: Diagnosis not present

## 2023-03-07 DIAGNOSIS — M6259 Muscle wasting and atrophy, not elsewhere classified, multiple sites: Secondary | ICD-10-CM | POA: Diagnosis not present

## 2023-03-08 DIAGNOSIS — M6259 Muscle wasting and atrophy, not elsewhere classified, multiple sites: Secondary | ICD-10-CM | POA: Diagnosis not present

## 2023-03-09 DIAGNOSIS — M6259 Muscle wasting and atrophy, not elsewhere classified, multiple sites: Secondary | ICD-10-CM | POA: Diagnosis not present

## 2023-03-10 DIAGNOSIS — M6259 Muscle wasting and atrophy, not elsewhere classified, multiple sites: Secondary | ICD-10-CM | POA: Diagnosis not present

## 2023-03-13 DIAGNOSIS — M6259 Muscle wasting and atrophy, not elsewhere classified, multiple sites: Secondary | ICD-10-CM | POA: Diagnosis not present

## 2023-03-14 DIAGNOSIS — M6259 Muscle wasting and atrophy, not elsewhere classified, multiple sites: Secondary | ICD-10-CM | POA: Diagnosis not present

## 2023-03-15 DIAGNOSIS — M6259 Muscle wasting and atrophy, not elsewhere classified, multiple sites: Secondary | ICD-10-CM | POA: Diagnosis not present

## 2023-03-16 DIAGNOSIS — M6259 Muscle wasting and atrophy, not elsewhere classified, multiple sites: Secondary | ICD-10-CM | POA: Diagnosis not present

## 2023-03-17 DIAGNOSIS — M6259 Muscle wasting and atrophy, not elsewhere classified, multiple sites: Secondary | ICD-10-CM | POA: Diagnosis not present

## 2023-03-20 DIAGNOSIS — M6259 Muscle wasting and atrophy, not elsewhere classified, multiple sites: Secondary | ICD-10-CM | POA: Diagnosis not present

## 2023-03-21 DIAGNOSIS — M6259 Muscle wasting and atrophy, not elsewhere classified, multiple sites: Secondary | ICD-10-CM | POA: Diagnosis not present

## 2023-03-23 DIAGNOSIS — M6259 Muscle wasting and atrophy, not elsewhere classified, multiple sites: Secondary | ICD-10-CM | POA: Diagnosis not present

## 2023-03-24 DIAGNOSIS — M6259 Muscle wasting and atrophy, not elsewhere classified, multiple sites: Secondary | ICD-10-CM | POA: Diagnosis not present

## 2023-03-27 DIAGNOSIS — M6259 Muscle wasting and atrophy, not elsewhere classified, multiple sites: Secondary | ICD-10-CM | POA: Diagnosis not present

## 2023-04-21 DIAGNOSIS — I10 Essential (primary) hypertension: Secondary | ICD-10-CM | POA: Diagnosis not present

## 2023-04-21 DIAGNOSIS — R600 Localized edema: Secondary | ICD-10-CM | POA: Diagnosis not present

## 2023-04-21 DIAGNOSIS — M6281 Muscle weakness (generalized): Secondary | ICD-10-CM | POA: Diagnosis not present

## 2023-05-12 ENCOUNTER — Encounter: Payer: Self-pay | Admitting: Primary Care

## 2023-05-12 ENCOUNTER — Ambulatory Visit (INDEPENDENT_AMBULATORY_CARE_PROVIDER_SITE_OTHER): Payer: 59 | Admitting: Primary Care

## 2023-05-12 VITALS — BP 124/60 | HR 96 | Temp 98.2°F | Ht 64.0 in | Wt 178.0 lb

## 2023-05-12 DIAGNOSIS — I509 Heart failure, unspecified: Secondary | ICD-10-CM | POA: Insufficient documentation

## 2023-05-12 DIAGNOSIS — J9859 Other diseases of mediastinum, not elsewhere classified: Secondary | ICD-10-CM

## 2023-05-12 DIAGNOSIS — G4733 Obstructive sleep apnea (adult) (pediatric): Secondary | ICD-10-CM | POA: Diagnosis not present

## 2023-05-12 NOTE — Assessment & Plan Note (Signed)
-   Follows with cardiology for heart failure. She takes furosemide as needed. Repeat echocardiogram in January 2024 showed normal LV systolic function with resolution pericardial effusion.

## 2023-05-12 NOTE — Assessment & Plan Note (Signed)
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

## 2023-05-12 NOTE — Assessment & Plan Note (Addendum)
-   Patient is 80% compliant with CPAP use last 30 days. She lives in an assisted living facility, needs help putting CPAP mask on. Advised she wear nightly for 6-8 hours. Recommend facility put CPAP mask on around 9-30-10pm and remove between 6-7am. Current pressure 8cm h20; Residual AHI 8.8/hour. She is having large amount of airleaks. Advise patient change mask cushions and filter monthly, mask frame and tubing every 3 months and headgear along with water chamber every 6 months. He had a previous titration study that showed she did better with AVS BIPAP. She needs in-lab titration study. Needs to change DME company from [employer] to [employer].

## 2023-05-12 NOTE — Patient Instructions (Addendum)
Orders: - CPAP titration study in lab  Follow-up: - 3 month with Beth NP in person   CPAP and BIPAP Information CPAP and BIPAP are methods that use air pressure to keep your airways open and to help you breathe well. CPAP and BIPAP use different amounts of pressure. Your health care provider will tell you whether CPAP or BIPAP would be more helpful for you. CPAP stands for "continuous positive airway pressure." With CPAP, the amount of pressure stays the same while you breathe in (inhale) and out (exhale). BIPAP stands for "bi-level positive airway pressure." With BIPAP, the amount of pressure will be higher when you inhale and lower when you exhale. This allows you to take larger breaths. CPAP or BIPAP may be used in the hospital, or your health care provider may want you to use it at home. You may need to have a sleep study before your health care provider can order a machine for you to use at home. What are the advantages? CPAP or BIPAP can be helpful if you have: Sleep apnea. Chronic obstructive pulmonary disease (COPD). Heart failure. Medical conditions that cause muscle weakness, including muscular dystrophy or amyotrophic lateral sclerosis (ALS). Other problems that cause breathing to be shallow, weak, abnormal, or difficult. CPAP and BIPAP are most commonly used for obstructive sleep apnea (OSA) to keep the airways from collapsing when the muscles relax during sleep. What are the risks? Generally, this is a safe treatment. However, problems may occur, including: Irritated skin or skin sores if the mask does not fit properly. Dry or stuffy nose or nosebleeds. Dry mouth. Feeling gassy or bloated. Sinus or lung infection if the equipment is not cleaned properly. When should CPAP or BIPAP be used? In most cases, the mask only needs to be worn during sleep. Generally, the mask needs to be worn throughout the night and during any daytime naps. People with certain medical conditions may  also need to wear the mask at other times, such as when they are awake. Follow instructions from your health care provider about when to use the machine. What happens during CPAP or BIPAP?  Both CPAP and BIPAP are provided by a small machine with a flexible plastic tube that attaches to a plastic mask that you wear. Air is blown through the mask into your nose or mouth. The amount of pressure that is used to blow the air can be adjusted on the machine. Your health care provider will set the pressure setting and help you find the best mask for you. Tips for using the mask Because the mask needs to be snug, some people feel trapped or closed-in (claustrophobic) when first using the mask. If you feel this way, you may need to get used to the mask. One way to do this is to hold the mask loosely over your nose or mouth and then gradually apply the mask more snugly. You can also gradually increase the amount of time that you use the mask. Masks are available in various types and sizes. If your mask does not fit well, talk with your health care provider about getting a different one. Some common types of masks include: Full face masks, which fit over the mouth and nose. Nasal masks, which fit over the nose. Nasal pillow or prong masks, which fit into the nostrils. If you are using a mask that fits over your nose and you tend to breathe through your mouth, a chin strap may be applied to help keep your  mouth closed. Use a skin barrier to protect your skin as told by your health care provider. Some CPAP and BIPAP machines have alarms that may sound if the mask comes off or develops a leak. If you have trouble with the mask, it is very important that you talk with your health care provider about finding a way to make the mask easier to tolerate. Do not stop using the mask. There could be a negative impact on your health if you stop using the mask. Tips for using the machine Place your CPAP or BIPAP machine on a  secure table or stand near an electrical outlet. Know where the on/off switch is on the machine. Follow instructions from your health care provider about how to set the pressure on your machine and when you should use it. Do not eat or drink while the CPAP or BIPAP machine is on. Food or fluids could get pushed into your lungs by the pressure of the CPAP or BIPAP. For home use, CPAP and BIPAP machines can be rented or purchased through home health care companies. Many different brands of machines are available. Renting a machine before purchasing may help you find out which particular machine works well for you. Your health insurance company may also decide which machine you may get. Keep the CPAP or BIPAP machine and attachments clean. Ask your health care provider for specific instructions. Check the humidifier if you have a dry stuffy nose or nosebleeds. Make sure it is working correctly. Follow these instructions at home: Take over-the-counter and prescription medicines only as told by your health care provider. Ask if you can take sinus medicine if your sinuses are blocked. Do not use any products that contain nicotine or tobacco. These products include cigarettes, chewing tobacco, and vaping devices, such as e-cigarettes. If you need help quitting, ask your health care provider. Keep all follow-up visits. This is important. Contact a health care provider if: You have redness or pressure sores on your head, face, mouth, or nose from the mask or head gear. You have trouble using the CPAP or BIPAP machine. You cannot tolerate wearing the CPAP or BIPAP mask. Someone tells you that you snore even when wearing your CPAP or BIPAP. Get help right away if: You have trouble breathing. You feel confused. Summary CPAP and BIPAP are methods that use air pressure to keep your airways open and to help you breathe well. If you have trouble with the mask, it is very important that you talk with your health  care provider about finding a way to make the mask easier to tolerate. Do not stop using the mask. There could be a negative impact to your health if you stop using the mask. Follow instructions from your health care provider about when to use the machine. This information is not intended to replace advice given to you by your health care provider. Make sure you discuss any questions you have with your health care provider. Document Revised: 02/10/2021 Document Reviewed: 06/12/2020 Elsevier Patient Education  2023 [employer].

## 2023-05-12 NOTE — Progress Notes (Signed)
@Patient  ID: Taylor Johnson, female    DOB: April 08, 1946, 77 y.o.   MRN: [id number]  Chief Complaint  Patient presents with   Follow-up    Referring provider: Reubin Milan, MD  HPI: 77 year-old female, never smoked.  Past medical history significant for hypertension, OSA on CPAP, GERD, obesity.  Previous [facility] pulmonary encounter: 02/28/2022 Patient presents today for new sleep consult.  She is currently on CPAP for history of obstructive sleep apnea.  She had a sleep study 03/16/2017 which showed evidence of mild obstructive sleep apnea, total AHI was 5.1/hr (events predominantly occurring during REM 21.1/hr).   Accompanied by her sister. She has been having issues with current full face mask. She has a hard time getting her mask on at night, needs something more simple. She is not having any issues with current pressure settings. Overall she is sleeping ok at night, feels for the most part well rested in the morning when she wakes up. She will occasionally take a nap mid day. Previous CPAP pressure was 8 cm H2O. She is looking to get new mask. States that current DME company does not take her insurance. She is established with [facility].  She also reports shortness of breath when walking to the mail box. She has a dry non-productive cough. She has no shortness of breath when laying flat at night or when at rest. Denies F/C/S, chest tightness or wheezing.   Sleep questionnaire Symptoms-  Hx sleep apnea, current on CPAP. Symptoms of loud snoring, daytime sleepiness Prior sleep study- August 2018 Bedtime- 10pm Time to fall asleep- Unsure  Nocturnal awakenings- three times Out of bed/start of day- 6am  Weight changes- None Do you operate heavy machinery- No Do you currently wear CPAP- Yes Do you current wear oxygen- No Epworth- 10 Medications- Zyprexa 2.5mg  at bedtime    05/27/2022 Patient presents today of OSA follow-up. Newly dx mediastinal mass. She was admitted in October  for 12 days d/t respiratory failure and pericardial effusion. Following with Dr. Donneta Romberg with oncology for mediastinal mass, needs to reschedule PET scan and apt.  CXR was normal. Echocardogram showed normal LVEF, small or moderate pericardial effusion. Discharged on lasix prn.   Accompanied by her sister, she is doing well without acute complaints. No issues with mask pressure. She has an airsense 10 CPAP machine. She is on CPAP at 8cm h20. She is sleeping well at night. She is having some residual apneas. She has a dark mark on her norse which does not appear to bother the patient. No skin breakdown.  Airview download 03/28/21-03/27/22 Usage 352/365 days used Average usage 5 hours 16 minutes Pressure 8 cm H2O Air leaks 119 L/min (95%) AHI 18.1  OSA on CPAP - Current on CPAP at 8cm, having airleaks and residual AHI 18 - Placing an order for new CPAP machine auto 5-10cm h20 - DME company to provide patient with [employer] respironics dreamwear hybrid full face mask size med with headgear     05/12/2023- Interim hx  Patient presents today for 6 month follow-up/OSA. Lives in assisted living. Since out last OV she has not received new CPAP machine. Currently using CPAP at 8cm h20; Residual AHI 8.8/hour. Received download from [employer]. She had BIPAP AVS titration study done on 10/08/21 which showed patient did well on pressure following settings: Min EPAP 4cm; Max EPAP 15cm h20. PS 5-15cm h20. Max pressure 25cm h20. Back up on Auto. Resmed airfit full facemask, size medium and heated humidity.  Patient family member states he has trouble putting CPAP mask on her self but once she has mask on she does well. She gets about 8 hours of usage on nights she wears her CPAP. She does not have a regular bedtime. She will fall asleep watching TV in her room. Agreeing that a good bedtime is between 9:30-10pm. She typically gets up around 6am. Needs to change DME company to [place].   Airview download  04/12/23-05/11/23 Usage 24/30 days; 21 days (70%) > 4 hours Average usage days used 8 hours 1 min Pressure 8cm h20 Airleaks 89.7L/min AHI 8.8   No Known Allergies  Immunization History  Administered Date(s) Administered   Fluad Quad(high Dose 65+) 05/06/2022   Influenza Inj Mdck Quad Pf 05/08/2019   Influenza, High Dose Seasonal PF 04/13/2018, 04/24/2020, 05/02/2021   Influenza,inj,Quad PF,6+ Mos 05/12/2015, 04/05/2016, 03/16/2017   Influenza-Unspecified 05/10/2019   Moderna Covid-19 Vaccine Bivalent Booster 29yrs & up 05/06/2021   Moderna SARS-COV2 Booster Vaccination 06/30/2020   Moderna Sars-Covid-2 Vaccination 08/22/2019, 09/19/2019   Pneumococcal Conjugate-13 04/05/2016   Pneumococcal Polysaccharide-23 01/06/2014, 09/13/2015   Zoster Recombinant(Shingrix) 05/08/2019    Past Medical History:  Diagnosis Date   Abdominal pain 01/27/2017   Anxiety    Anxiety, generalized 05/11/2015   Cough 06/02/2016   Chronic - followed by Pulmonary   Cough 06/02/2016   Chronic - followed by Pulmonary   Depression    Developmental delay    Diverticulitis    Diverticulosis of colon 05/11/2015   Dysphagia 10/15/2015   Routine Ba Swallow normal; rec modified barium swallow    GERD (gastroesophageal reflux disease)    Hematochezia 11/07/2019   Hypertension    Hypoxia 09/12/2015   Leg swelling    Localized edema 04/15/2016   Microscopic hematuria 04/15/2016   Nausea and vomiting 01/29/2017   Orthostatic hypotension 03/16/2017   OSA on CPAP 11/18/2015   CPAP @ 18 cm H2O started 02/2016   Rectal bleeding 10/15/2018   Urge incontinence of urine 05/12/2015    Tobacco History: Social History   Tobacco Use  Smoking Status Never  Smokeless Tobacco Never  Tobacco Comments   smoking cessation materials not required   Counseling given: Not Answered Tobacco comments: smoking cessation materials not required   Outpatient Medications Prior to Visit  Medication Sig Dispense Refill    acetaminophen (TYLENOL 8 HOUR ARTHRITIS PAIN) 650 MG CR tablet Take 650 mg by mouth every 8 (eight) hours as needed for pain.     Amino Acids-Protein Hydrolys (PRO-STAT PO) Take by mouth as directed.     bisacodyl (DULCOLAX) 10 MG suppository Place 10 mg rectally as needed for moderate constipation.     furosemide (LASIX) 20 MG tablet Take 1 tablet (20 mg total) by mouth daily as needed for edema. 30 tablet 0   hydrALAZINE (APRESOLINE) 25 MG tablet NEW PRESCRIPTION REQUEST:: TAKE ONE TABLET BY MOUTH THREE TIMES DAILY 270 tablet 0   meclizine (ANTIVERT) 12.5 MG tablet TAKE 1 TABLET BY MOUTH 3 TIMES DAILY AS NEEDED FOR DIZZINESS 30 tablet 0   metoCLOPramide (REGLAN) 5 MG tablet TAKE ONE TABLET BY MOUTH THREE TIMES A DAY 90 tablet 1   OLANZapine (ZYPREXA) 2.5 MG tablet Take 1 tablet (2.5 mg total) by mouth at bedtime. 90 tablet 1   pantoprazole (PROTONIX) 40 MG tablet Take 1 tablet (40 mg total) by mouth 2 (two) times daily. 60 tablet 1   Sennosides-Docusate Sodium (SENNA PLUS PO) Take 1 tablet by mouth at bedtime.  No facility-administered medications prior to visit.      Review of Systems  Review of Systems  Constitutional: Negative.   Respiratory: Negative.    Psychiatric/Behavioral: Negative.       Physical Exam  BP 124/60 (BP Location: Right Arm, Cuff Size: Normal)   Pulse 96   Temp 98.2 F (36.8 C)   Ht 5\' 4"  (1.626 m)   Wt 178 lb (80.7 kg)   SpO2 96%   BMI 30.55 kg/m  Physical Exam Constitutional:      Appearance: Normal appearance.  HENT:     Head: Normocephalic and atraumatic.  Cardiovascular:     Rate and Rhythm: Normal rate and regular rhythm.  Pulmonary:     Effort: Pulmonary effort is normal.     Breath sounds: Normal breath sounds.  Neurological:     General: No focal deficit present.     Mental Status: She is alert and oriented to person, place, and time. Mental status is at baseline.  Psychiatric:        Mood and Affect: Mood normal.        Behavior:  Behavior normal.        Thought Content: Thought content normal.        Judgment: Judgment normal.      Lab Results:  CBC    Component Value Date/Time   WBC 11.5 (H) 06/22/2022 1225   RBC 4.14 06/22/2022 1225   HGB 11.1 (L) 06/22/2022 1225   HGB 11.7 12/29/2021 1340   HCT 36.8 06/22/2022 1225   HCT 37.7 12/29/2021 1340   PLT 320 06/22/2022 1225   PLT 260 12/29/2021 1340   MCV 88.9 06/22/2022 1225   MCV 89 12/29/2021 1340   MCV 90 02/01/2014 2220   MCH 26.8 06/22/2022 1225   MCHC 30.2 06/22/2022 1225   RDW 16.8 (H) 06/22/2022 1225   RDW 15.8 (H) 12/29/2021 1340   RDW 15.9 (H) 02/01/2014 2220   LYMPHSABS 1.7 06/22/2022 1225   LYMPHSABS 1.9 12/29/2021 1340   LYMPHSABS 2.1 02/01/2014 2220   MONOABS 0.8 06/22/2022 1225   MONOABS 0.7 02/01/2014 2220   EOSABS 0.1 06/22/2022 1225   EOSABS 0.3 12/29/2021 1340   EOSABS 0.3 02/01/2014 2220   BASOSABS 0.1 06/22/2022 1225   BASOSABS 0.0 12/29/2021 1340   BASOSABS 0.0 02/01/2014 2220    BMET    Component Value Date/Time   NA 134 (L) 06/22/2022 1225   NA 140 12/29/2021 1340   NA 140 02/01/2014 2220   K 3.3 (L) 06/22/2022 1225   K 3.5 02/01/2014 2220   CL 93 (L) 06/22/2022 1225   CL 105 02/01/2014 2220   CO2 31 06/22/2022 1225   CO2 29 02/01/2014 2220   GLUCOSE 117 (H) 06/22/2022 1225   GLUCOSE 103 (H) 02/01/2014 2220   BUN 12 06/22/2022 1225   BUN 15 12/29/2021 1340   BUN 14 02/01/2014 2220   CREATININE 0.70 06/22/2022 1225   CREATININE 1.04 02/01/2014 2220   CALCIUM 9.3 06/22/2022 1225   CALCIUM 9.2 02/01/2014 2220   GFRNONAA >60 06/22/2022 1225   GFRNONAA 56 (L) 02/01/2014 2220   GFRAA 71 06/04/2020 1140   GFRAA >60 02/01/2014 2220    BNP    Component Value Date/Time   BNP 134.8 (H) 05/10/2022 1224    ProBNP No results found for: "PROBNP"  Imaging: No results found.   Assessment & Plan:   OSA on CPAP - Patient is 80% compliant with CPAP use last 30 days. She  lives in an assisted living facility,  needs help putting CPAP mask on. Advised she wear nightly for 6-8 hours. Recommend facility put CPAP mask on around 9-30-10pm and remove between 6-7am. Current pressure 8cm h20; Residual AHI 8.8/hour. She is having large amount of airleaks. Advise patient change mask cushions and filter monthly, mask frame and tubing every 3 months and headgear along with water chamber every 6 months. He had a previous titration study that showed she did better with AVS BIPAP. She needs in-lab titration study. Needs to change DME company from [facility] to [facility].    Heart failure (HCC) - Follows with cardiology for heart failure. She takes furosemide as needed. Repeat echocardiogram in January 2024 showed normal LV systolic function with resolution pericardial effusion.   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

## 2023-05-16 DIAGNOSIS — M6259 Muscle wasting and atrophy, not elsewhere classified, multiple sites: Secondary | ICD-10-CM | POA: Diagnosis not present

## 2023-05-17 DIAGNOSIS — Z23 Encounter for immunization: Secondary | ICD-10-CM | POA: Diagnosis not present

## 2023-05-17 DIAGNOSIS — M6259 Muscle wasting and atrophy, not elsewhere classified, multiple sites: Secondary | ICD-10-CM | POA: Diagnosis not present

## 2023-05-18 DIAGNOSIS — M6259 Muscle wasting and atrophy, not elsewhere classified, multiple sites: Secondary | ICD-10-CM | POA: Diagnosis not present

## 2023-05-18 DIAGNOSIS — Z23 Encounter for immunization: Secondary | ICD-10-CM | POA: Diagnosis not present

## 2023-05-19 DIAGNOSIS — I10 Essential (primary) hypertension: Secondary | ICD-10-CM | POA: Diagnosis not present

## 2023-05-19 DIAGNOSIS — M6259 Muscle wasting and atrophy, not elsewhere classified, multiple sites: Secondary | ICD-10-CM | POA: Diagnosis not present

## 2023-05-21 DIAGNOSIS — M6259 Muscle wasting and atrophy, not elsewhere classified, multiple sites: Secondary | ICD-10-CM | POA: Diagnosis not present

## 2023-05-21 DIAGNOSIS — I10 Essential (primary) hypertension: Secondary | ICD-10-CM | POA: Diagnosis not present

## 2023-05-22 DIAGNOSIS — I10 Essential (primary) hypertension: Secondary | ICD-10-CM | POA: Diagnosis not present

## 2023-05-22 DIAGNOSIS — M6259 Muscle wasting and atrophy, not elsewhere classified, multiple sites: Secondary | ICD-10-CM | POA: Diagnosis not present

## 2023-05-23 DIAGNOSIS — G4700 Insomnia, unspecified: Secondary | ICD-10-CM | POA: Diagnosis not present

## 2023-05-23 DIAGNOSIS — M6259 Muscle wasting and atrophy, not elsewhere classified, multiple sites: Secondary | ICD-10-CM | POA: Diagnosis not present

## 2023-05-23 DIAGNOSIS — I10 Essential (primary) hypertension: Secondary | ICD-10-CM | POA: Diagnosis not present

## 2023-05-24 DIAGNOSIS — M6259 Muscle wasting and atrophy, not elsewhere classified, multiple sites: Secondary | ICD-10-CM | POA: Diagnosis not present

## 2023-05-24 DIAGNOSIS — I10 Essential (primary) hypertension: Secondary | ICD-10-CM | POA: Diagnosis not present

## 2023-05-25 DIAGNOSIS — I10 Essential (primary) hypertension: Secondary | ICD-10-CM | POA: Diagnosis not present

## 2023-05-25 DIAGNOSIS — M6259 Muscle wasting and atrophy, not elsewhere classified, multiple sites: Secondary | ICD-10-CM | POA: Diagnosis not present

## 2023-05-26 DIAGNOSIS — M6259 Muscle wasting and atrophy, not elsewhere classified, multiple sites: Secondary | ICD-10-CM | POA: Diagnosis not present

## 2023-05-26 DIAGNOSIS — I10 Essential (primary) hypertension: Secondary | ICD-10-CM | POA: Diagnosis not present

## 2023-05-29 DIAGNOSIS — I10 Essential (primary) hypertension: Secondary | ICD-10-CM | POA: Diagnosis not present

## 2023-05-29 DIAGNOSIS — M6259 Muscle wasting and atrophy, not elsewhere classified, multiple sites: Secondary | ICD-10-CM | POA: Diagnosis not present

## 2023-05-30 DIAGNOSIS — M6259 Muscle wasting and atrophy, not elsewhere classified, multiple sites: Secondary | ICD-10-CM | POA: Diagnosis not present

## 2023-05-30 DIAGNOSIS — I10 Essential (primary) hypertension: Secondary | ICD-10-CM | POA: Diagnosis not present

## 2023-05-31 DIAGNOSIS — M6259 Muscle wasting and atrophy, not elsewhere classified, multiple sites: Secondary | ICD-10-CM | POA: Diagnosis not present

## 2023-05-31 DIAGNOSIS — I10 Essential (primary) hypertension: Secondary | ICD-10-CM | POA: Diagnosis not present

## 2023-06-01 DIAGNOSIS — I10 Essential (primary) hypertension: Secondary | ICD-10-CM | POA: Diagnosis not present

## 2023-06-01 DIAGNOSIS — M6259 Muscle wasting and atrophy, not elsewhere classified, multiple sites: Secondary | ICD-10-CM | POA: Diagnosis not present

## 2023-06-02 DIAGNOSIS — I10 Essential (primary) hypertension: Secondary | ICD-10-CM | POA: Diagnosis not present

## 2023-06-02 DIAGNOSIS — M6259 Muscle wasting and atrophy, not elsewhere classified, multiple sites: Secondary | ICD-10-CM | POA: Diagnosis not present

## 2023-06-05 DIAGNOSIS — M6259 Muscle wasting and atrophy, not elsewhere classified, multiple sites: Secondary | ICD-10-CM | POA: Diagnosis not present

## 2023-06-05 DIAGNOSIS — I10 Essential (primary) hypertension: Secondary | ICD-10-CM | POA: Diagnosis not present

## 2023-06-06 DIAGNOSIS — M6259 Muscle wasting and atrophy, not elsewhere classified, multiple sites: Secondary | ICD-10-CM | POA: Diagnosis not present

## 2023-06-06 DIAGNOSIS — I10 Essential (primary) hypertension: Secondary | ICD-10-CM | POA: Diagnosis not present

## 2023-06-07 DIAGNOSIS — M6259 Muscle wasting and atrophy, not elsewhere classified, multiple sites: Secondary | ICD-10-CM | POA: Diagnosis not present

## 2023-06-07 DIAGNOSIS — D509 Iron deficiency anemia, unspecified: Secondary | ICD-10-CM | POA: Diagnosis not present

## 2023-06-07 DIAGNOSIS — I10 Essential (primary) hypertension: Secondary | ICD-10-CM | POA: Diagnosis not present

## 2023-06-07 DIAGNOSIS — E559 Vitamin D deficiency, unspecified: Secondary | ICD-10-CM | POA: Diagnosis not present

## 2023-06-07 DIAGNOSIS — G4733 Obstructive sleep apnea (adult) (pediatric): Secondary | ICD-10-CM | POA: Diagnosis not present

## 2023-06-07 DIAGNOSIS — D649 Anemia, unspecified: Secondary | ICD-10-CM | POA: Diagnosis not present

## 2023-06-08 DIAGNOSIS — M6259 Muscle wasting and atrophy, not elsewhere classified, multiple sites: Secondary | ICD-10-CM | POA: Diagnosis not present

## 2023-06-08 DIAGNOSIS — I10 Essential (primary) hypertension: Secondary | ICD-10-CM | POA: Diagnosis not present

## 2023-06-09 DIAGNOSIS — M6259 Muscle wasting and atrophy, not elsewhere classified, multiple sites: Secondary | ICD-10-CM | POA: Diagnosis not present

## 2023-06-09 DIAGNOSIS — I10 Essential (primary) hypertension: Secondary | ICD-10-CM | POA: Diagnosis not present

## 2023-06-13 DIAGNOSIS — I10 Essential (primary) hypertension: Secondary | ICD-10-CM | POA: Diagnosis not present

## 2023-06-14 DIAGNOSIS — I10 Essential (primary) hypertension: Secondary | ICD-10-CM | POA: Diagnosis not present

## 2023-06-16 DIAGNOSIS — I10 Essential (primary) hypertension: Secondary | ICD-10-CM | POA: Diagnosis not present

## 2023-06-20 DIAGNOSIS — I10 Essential (primary) hypertension: Secondary | ICD-10-CM | POA: Diagnosis not present

## 2023-06-21 DIAGNOSIS — I10 Essential (primary) hypertension: Secondary | ICD-10-CM | POA: Diagnosis not present

## 2023-06-22 DIAGNOSIS — R001 Bradycardia, unspecified: Secondary | ICD-10-CM | POA: Diagnosis not present

## 2023-06-23 DIAGNOSIS — I10 Essential (primary) hypertension: Secondary | ICD-10-CM | POA: Diagnosis not present

## 2023-06-23 DIAGNOSIS — E119 Type 2 diabetes mellitus without complications: Secondary | ICD-10-CM | POA: Diagnosis not present

## 2023-06-23 DIAGNOSIS — D649 Anemia, unspecified: Secondary | ICD-10-CM | POA: Diagnosis not present

## 2023-06-26 DIAGNOSIS — D509 Iron deficiency anemia, unspecified: Secondary | ICD-10-CM | POA: Diagnosis not present

## 2023-06-27 DIAGNOSIS — R001 Bradycardia, unspecified: Secondary | ICD-10-CM | POA: Diagnosis not present

## 2023-06-29 DIAGNOSIS — I10 Essential (primary) hypertension: Secondary | ICD-10-CM | POA: Diagnosis not present

## 2023-06-30 DIAGNOSIS — I951 Orthostatic hypotension: Secondary | ICD-10-CM | POA: Diagnosis not present

## 2023-07-01 DIAGNOSIS — I10 Essential (primary) hypertension: Secondary | ICD-10-CM | POA: Diagnosis not present

## 2023-07-03 DIAGNOSIS — R001 Bradycardia, unspecified: Secondary | ICD-10-CM | POA: Diagnosis not present

## 2023-07-04 DIAGNOSIS — I10 Essential (primary) hypertension: Secondary | ICD-10-CM | POA: Diagnosis not present

## 2023-07-05 DIAGNOSIS — I10 Essential (primary) hypertension: Secondary | ICD-10-CM | POA: Diagnosis not present

## 2023-07-06 DIAGNOSIS — I10 Essential (primary) hypertension: Secondary | ICD-10-CM | POA: Diagnosis not present

## 2023-07-07 DIAGNOSIS — R636 Underweight: Secondary | ICD-10-CM | POA: Diagnosis not present

## 2023-07-17 DIAGNOSIS — R601 Generalized edema: Secondary | ICD-10-CM | POA: Diagnosis not present

## 2023-07-21 DIAGNOSIS — R001 Bradycardia, unspecified: Secondary | ICD-10-CM | POA: Diagnosis not present

## 2023-07-24 DIAGNOSIS — R001 Bradycardia, unspecified: Secondary | ICD-10-CM | POA: Diagnosis not present

## 2023-07-25 DIAGNOSIS — I10 Essential (primary) hypertension: Secondary | ICD-10-CM | POA: Diagnosis not present

## 2023-07-27 DIAGNOSIS — R001 Bradycardia, unspecified: Secondary | ICD-10-CM | POA: Diagnosis not present

## 2023-07-31 DIAGNOSIS — R001 Bradycardia, unspecified: Secondary | ICD-10-CM | POA: Diagnosis not present

## 2023-08-01 DIAGNOSIS — R059 Cough, unspecified: Secondary | ICD-10-CM | POA: Diagnosis not present

## 2023-08-01 DIAGNOSIS — R52 Pain, unspecified: Secondary | ICD-10-CM | POA: Diagnosis not present

## 2023-08-02 DIAGNOSIS — I9589 Other hypotension: Secondary | ICD-10-CM | POA: Diagnosis not present

## 2023-08-04 ENCOUNTER — Telehealth: Payer: Self-pay | Admitting: Primary Care

## 2023-08-04 DIAGNOSIS — G4733 Obstructive sleep apnea (adult) (pediatric): Secondary | ICD-10-CM

## 2023-08-04 NOTE — Telephone Encounter (Signed)
By "in-hospital" are they indicating need for a standard CPAP titration sleep study in sleep center, so they have staff with enough hand to help her in and out of bed?

## 2023-08-04 NOTE — Telephone Encounter (Signed)
Taylor Johnson saw this patient on 05/12/2023 and ordered Cpap Titration Study. I filled out the form and faxed the records to [facility] here in [place]. They sent [name] a note back stating the patient would need to do her sleep study in the hospital because they would need a lift to transfer the patient from chair to bed. Patient is not weight bearing.  Dr. Maple Hudson do you know of a patient doing a sleep study in the hospital?  Dr. Maple Hudson what is your recommendation?  The patient Cpap has been broke for months and she really needs her Cpap per the sister Taylor Johnson.

## 2023-08-07 DIAGNOSIS — R001 Bradycardia, unspecified: Secondary | ICD-10-CM | POA: Diagnosis not present

## 2023-08-07 NOTE — Telephone Encounter (Signed)
I can't answer that, that is a question for the sleep lab  I ordered it to be done in lab, she needs a titration study- we can not do that at home

## 2023-08-07 NOTE — Telephone Encounter (Signed)
Beth can you answer Dr. Roxy Cedar question

## 2023-08-08 NOTE — Telephone Encounter (Signed)
I have left a message asking Mrs. Myriam Jacobson the sister to call me so I can explain what will need to be done

## 2023-08-08 NOTE — Telephone Encounter (Signed)
Can we call patient and ask if they have anyone that can accompany them and help them into bed?

## 2023-08-08 NOTE — Telephone Encounter (Signed)
I just called Taylor Johnson with [employer]. Taylor stated their Techs do not lift patients.  If the patient doesn't have family that can lift them then the sleep study can't be done in lab. Taylor did state they use to have an Aide that came from the hospital to help but they don't have that anymore

## 2023-08-11 DIAGNOSIS — H2513 Age-related nuclear cataract, bilateral: Secondary | ICD-10-CM | POA: Diagnosis not present

## 2023-08-11 DIAGNOSIS — H524 Presbyopia: Secondary | ICD-10-CM | POA: Diagnosis not present

## 2023-08-11 DIAGNOSIS — H02201 Unspecified lagophthalmos right upper eyelid: Secondary | ICD-10-CM | POA: Diagnosis not present

## 2023-08-11 DIAGNOSIS — H1045 Other chronic allergic conjunctivitis: Secondary | ICD-10-CM | POA: Diagnosis not present

## 2023-08-14 ENCOUNTER — Telehealth: Payer: Self-pay | Admitting: Primary Care

## 2023-08-14 DIAGNOSIS — R2681 Unsteadiness on feet: Secondary | ICD-10-CM | POA: Diagnosis not present

## 2023-08-14 NOTE — Telephone Encounter (Signed)
Form Patient of Dr. Welton Flakes, last seen in January 2023. Patient had CPAP/BIPAP  polysomnography in March 2023 and was recommend patient be started on BIPAP ASV settings: Min EPAP 4cm; Max EPAP 15cm h20. PS 5-15cm h20. Max pressure 25cm h20. Back up on Auto. Resmed airfit full facemask, size medium and heated humidity. Order was placed in January 2024. Patient remained on CPAP at pressure 8cm with minimal residual AHI.  Patient lives at assisted living. Unable to go to lab for TITRATION study. She is currently on CPAP and moderately compliance. Current pressure 8cm h20 with residual AHI 8.8/hour. Large amt of airlekas. Some difficulty tolerating mask.  I'll put an order in for auto cpap 5-15cm h20  Cancel in lab titration study.

## 2023-08-14 NOTE — Telephone Encounter (Signed)
Called and spoke with patients sister, Myriam Jacobson.  Myriam Jacobson stated there is no one she knows of that can help lift patient from the chair to the bed in the hospital or sleep lab to allow the patient to get an in lab titration sleep study.  Myriam Jacobson states patient really needs to get a CPAP again as her sleep and breathing has gotten worse since her current machine has been broken.  Beth,  I spoke with patients sister, Myriam Jacobson, in some length, and she has no one to help the patient get transferred from her chair to the bed.  She will ask other family members and friends to try to come up with a solution.  Please advise.

## 2023-08-14 NOTE — Telephone Encounter (Signed)
[name](sister) Has found someone to help [place] get into and out of bed. Would like to schedule the sleep study. Please call Myriam Jacobson to schedule

## 2023-08-15 DIAGNOSIS — I10 Essential (primary) hypertension: Secondary | ICD-10-CM | POA: Diagnosis not present

## 2023-08-15 NOTE — Telephone Encounter (Signed)
I have spoke with Mrs. Taylor Johnson and [name]'s Cpap Titration Study has been scheduled at [facility] on 10/11/23. I spoke with Marita [place] and since they will be moving to [address] at [facility] they would be able to use the hospital's lift. Since the patient has other issues the 2 people that Myriam Jacobson has found to help with the patient during the sleep study will still be needed to take care of bed pan, changing bed linens, etc.  Terri with the Sleep Lab will be mailing the instructions to Taylor Johnson per her request

## 2023-08-15 NOTE — Telephone Encounter (Signed)
Well ok. Amy can you order HST - otherwise out of options here

## 2023-08-15 NOTE — Telephone Encounter (Signed)
I have spoke with Mrs. Jean Rosenthal and [name]'s Cpap Titration Study has been scheduled at [facility] on 10/11/23. I spoke with Marita [place] and since they will be moving to [address] at [facility] they would be able to use the hospital's lift. Since the patient has other issues the 2 people that Myriam Jacobson has found to help with the patient during the sleep study will still be needed to take care of bed pan, changing bed linens, etc.  Terri with the Sleep Lab will be mailing the instructions to Jean Rosenthal per her request

## 2023-08-15 NOTE — Telephone Encounter (Signed)
Taylor Johnson according to the sister Myriam Jacobson the Cpap machine is broke and hasn't been working for months. I don't think changing cpap pressure will work

## 2023-08-16 NOTE — Telephone Encounter (Signed)
Taylor Johnson she is scheduled on 10/11/23 for Cpap Titiration no need for HST to be done

## 2023-08-17 DIAGNOSIS — R2681 Unsteadiness on feet: Secondary | ICD-10-CM | POA: Diagnosis not present

## 2023-08-17 DIAGNOSIS — D508 Other iron deficiency anemias: Secondary | ICD-10-CM | POA: Diagnosis not present

## 2023-08-17 DIAGNOSIS — I10 Essential (primary) hypertension: Secondary | ICD-10-CM | POA: Diagnosis not present

## 2023-08-17 DIAGNOSIS — R600 Localized edema: Secondary | ICD-10-CM | POA: Diagnosis not present

## 2023-08-17 DIAGNOSIS — E559 Vitamin D deficiency, unspecified: Secondary | ICD-10-CM | POA: Diagnosis not present

## 2023-08-18 DIAGNOSIS — E559 Vitamin D deficiency, unspecified: Secondary | ICD-10-CM | POA: Diagnosis not present

## 2023-08-18 DIAGNOSIS — I10 Essential (primary) hypertension: Secondary | ICD-10-CM | POA: Diagnosis not present

## 2023-08-18 DIAGNOSIS — D649 Anemia, unspecified: Secondary | ICD-10-CM | POA: Diagnosis not present

## 2023-08-19 DIAGNOSIS — R2681 Unsteadiness on feet: Secondary | ICD-10-CM | POA: Diagnosis not present

## 2023-08-20 DIAGNOSIS — R2681 Unsteadiness on feet: Secondary | ICD-10-CM | POA: Diagnosis not present

## 2023-08-21 DIAGNOSIS — R2681 Unsteadiness on feet: Secondary | ICD-10-CM | POA: Diagnosis not present

## 2023-08-22 DIAGNOSIS — R2681 Unsteadiness on feet: Secondary | ICD-10-CM | POA: Diagnosis not present

## 2023-08-24 DIAGNOSIS — R2681 Unsteadiness on feet: Secondary | ICD-10-CM | POA: Diagnosis not present

## 2023-08-24 DIAGNOSIS — R001 Bradycardia, unspecified: Secondary | ICD-10-CM | POA: Diagnosis not present

## 2023-08-25 DIAGNOSIS — R2681 Unsteadiness on feet: Secondary | ICD-10-CM | POA: Diagnosis not present

## 2023-08-27 DIAGNOSIS — R2681 Unsteadiness on feet: Secondary | ICD-10-CM | POA: Diagnosis not present

## 2023-08-28 DIAGNOSIS — R2681 Unsteadiness on feet: Secondary | ICD-10-CM | POA: Diagnosis not present

## 2023-08-29 DIAGNOSIS — R2681 Unsteadiness on feet: Secondary | ICD-10-CM | POA: Diagnosis not present

## 2023-08-30 DIAGNOSIS — K5900 Constipation, unspecified: Secondary | ICD-10-CM | POA: Diagnosis not present

## 2023-08-30 DIAGNOSIS — R2681 Unsteadiness on feet: Secondary | ICD-10-CM | POA: Diagnosis not present

## 2023-08-31 DIAGNOSIS — R2681 Unsteadiness on feet: Secondary | ICD-10-CM | POA: Diagnosis not present

## 2023-08-31 DIAGNOSIS — I10 Essential (primary) hypertension: Secondary | ICD-10-CM | POA: Diagnosis not present

## 2023-09-04 DIAGNOSIS — R2681 Unsteadiness on feet: Secondary | ICD-10-CM | POA: Diagnosis not present

## 2023-09-05 DIAGNOSIS — R2681 Unsteadiness on feet: Secondary | ICD-10-CM | POA: Diagnosis not present

## 2023-09-05 DIAGNOSIS — I10 Essential (primary) hypertension: Secondary | ICD-10-CM | POA: Diagnosis not present

## 2023-09-06 DIAGNOSIS — R2681 Unsteadiness on feet: Secondary | ICD-10-CM | POA: Diagnosis not present

## 2023-09-07 DIAGNOSIS — I9589 Other hypotension: Secondary | ICD-10-CM | POA: Diagnosis not present

## 2023-09-08 DIAGNOSIS — R2681 Unsteadiness on feet: Secondary | ICD-10-CM | POA: Diagnosis not present

## 2023-09-27 DIAGNOSIS — I10 Essential (primary) hypertension: Secondary | ICD-10-CM | POA: Diagnosis not present

## 2023-10-03 DIAGNOSIS — I10 Essential (primary) hypertension: Secondary | ICD-10-CM | POA: Diagnosis not present

## 2023-10-04 DIAGNOSIS — B379 Candidiasis, unspecified: Secondary | ICD-10-CM | POA: Diagnosis not present

## 2023-10-11 ENCOUNTER — Ambulatory Visit (HOSPITAL_BASED_OUTPATIENT_CLINIC_OR_DEPARTMENT_OTHER): Payer: 59 | Admitting: Pulmonary Disease

## 2023-10-18 DIAGNOSIS — I10 Essential (primary) hypertension: Secondary | ICD-10-CM | POA: Diagnosis not present

## 2023-10-23 DIAGNOSIS — D508 Other iron deficiency anemias: Secondary | ICD-10-CM | POA: Diagnosis not present

## 2023-10-23 DIAGNOSIS — R600 Localized edema: Secondary | ICD-10-CM | POA: Diagnosis not present

## 2023-10-23 DIAGNOSIS — I10 Essential (primary) hypertension: Secondary | ICD-10-CM | POA: Diagnosis not present

## 2023-10-23 DIAGNOSIS — R42 Dizziness and giddiness: Secondary | ICD-10-CM | POA: Diagnosis not present

## 2023-10-26 DIAGNOSIS — R42 Dizziness and giddiness: Secondary | ICD-10-CM | POA: Diagnosis not present

## 2023-11-01 DIAGNOSIS — Z741 Need for assistance with personal care: Secondary | ICD-10-CM | POA: Diagnosis not present

## 2023-11-02 DIAGNOSIS — Z741 Need for assistance with personal care: Secondary | ICD-10-CM | POA: Diagnosis not present

## 2023-11-03 DIAGNOSIS — Z741 Need for assistance with personal care: Secondary | ICD-10-CM | POA: Diagnosis not present

## 2023-11-04 DIAGNOSIS — Z741 Need for assistance with personal care: Secondary | ICD-10-CM | POA: Diagnosis not present

## 2023-11-06 DIAGNOSIS — Z741 Need for assistance with personal care: Secondary | ICD-10-CM | POA: Diagnosis not present

## 2023-11-07 DIAGNOSIS — Z741 Need for assistance with personal care: Secondary | ICD-10-CM | POA: Diagnosis not present

## 2023-11-07 DIAGNOSIS — I10 Essential (primary) hypertension: Secondary | ICD-10-CM | POA: Diagnosis not present

## 2023-11-08 DIAGNOSIS — Z741 Need for assistance with personal care: Secondary | ICD-10-CM | POA: Diagnosis not present

## 2023-11-09 DIAGNOSIS — Z741 Need for assistance with personal care: Secondary | ICD-10-CM | POA: Diagnosis not present

## 2023-11-10 DIAGNOSIS — Z741 Need for assistance with personal care: Secondary | ICD-10-CM | POA: Diagnosis not present

## 2023-11-13 DIAGNOSIS — Z741 Need for assistance with personal care: Secondary | ICD-10-CM | POA: Diagnosis not present

## 2023-11-14 DIAGNOSIS — Z741 Need for assistance with personal care: Secondary | ICD-10-CM | POA: Diagnosis not present

## 2023-11-15 DIAGNOSIS — I10 Essential (primary) hypertension: Secondary | ICD-10-CM | POA: Diagnosis not present

## 2023-11-15 DIAGNOSIS — Z741 Need for assistance with personal care: Secondary | ICD-10-CM | POA: Diagnosis not present

## 2023-11-15 DIAGNOSIS — R488 Other symbolic dysfunctions: Secondary | ICD-10-CM | POA: Diagnosis not present

## 2023-11-16 DIAGNOSIS — Z741 Need for assistance with personal care: Secondary | ICD-10-CM | POA: Diagnosis not present

## 2023-11-16 DIAGNOSIS — R488 Other symbolic dysfunctions: Secondary | ICD-10-CM | POA: Diagnosis not present

## 2023-11-17 DIAGNOSIS — K5900 Constipation, unspecified: Secondary | ICD-10-CM | POA: Diagnosis not present

## 2023-11-18 DIAGNOSIS — R488 Other symbolic dysfunctions: Secondary | ICD-10-CM | POA: Diagnosis not present

## 2023-11-18 DIAGNOSIS — Z741 Need for assistance with personal care: Secondary | ICD-10-CM | POA: Diagnosis not present

## 2023-11-20 DIAGNOSIS — Z741 Need for assistance with personal care: Secondary | ICD-10-CM | POA: Diagnosis not present

## 2023-11-20 DIAGNOSIS — R488 Other symbolic dysfunctions: Secondary | ICD-10-CM | POA: Diagnosis not present

## 2023-11-28 DIAGNOSIS — I10 Essential (primary) hypertension: Secondary | ICD-10-CM | POA: Diagnosis not present

## 2023-12-14 DIAGNOSIS — I10 Essential (primary) hypertension: Secondary | ICD-10-CM | POA: Diagnosis not present

## 2023-12-21 DIAGNOSIS — D508 Other iron deficiency anemias: Secondary | ICD-10-CM | POA: Diagnosis not present

## 2023-12-21 DIAGNOSIS — K5909 Other constipation: Secondary | ICD-10-CM | POA: Diagnosis not present

## 2023-12-21 DIAGNOSIS — I10 Essential (primary) hypertension: Secondary | ICD-10-CM | POA: Diagnosis not present

## 2023-12-28 DIAGNOSIS — I10 Essential (primary) hypertension: Secondary | ICD-10-CM | POA: Diagnosis not present

## 2024-01-15 DIAGNOSIS — E611 Iron deficiency: Secondary | ICD-10-CM | POA: Diagnosis not present

## 2024-01-15 DIAGNOSIS — D529 Folate deficiency anemia, unspecified: Secondary | ICD-10-CM | POA: Diagnosis not present

## 2024-01-15 DIAGNOSIS — D508 Other iron deficiency anemias: Secondary | ICD-10-CM | POA: Diagnosis not present

## 2024-01-22 DIAGNOSIS — D509 Iron deficiency anemia, unspecified: Secondary | ICD-10-CM | POA: Diagnosis not present

## 2024-01-22 DIAGNOSIS — R2681 Unsteadiness on feet: Secondary | ICD-10-CM | POA: Diagnosis not present

## 2024-01-23 DIAGNOSIS — R2681 Unsteadiness on feet: Secondary | ICD-10-CM | POA: Diagnosis not present

## 2024-01-23 DIAGNOSIS — D508 Other iron deficiency anemias: Secondary | ICD-10-CM | POA: Diagnosis not present

## 2024-01-24 DIAGNOSIS — R2681 Unsteadiness on feet: Secondary | ICD-10-CM | POA: Diagnosis not present

## 2024-01-26 DIAGNOSIS — R2681 Unsteadiness on feet: Secondary | ICD-10-CM | POA: Diagnosis not present

## 2024-01-29 DIAGNOSIS — R2681 Unsteadiness on feet: Secondary | ICD-10-CM | POA: Diagnosis not present

## 2024-01-30 DIAGNOSIS — R2681 Unsteadiness on feet: Secondary | ICD-10-CM | POA: Diagnosis not present

## 2024-01-31 DIAGNOSIS — R2681 Unsteadiness on feet: Secondary | ICD-10-CM | POA: Diagnosis not present

## 2024-02-01 DIAGNOSIS — R2681 Unsteadiness on feet: Secondary | ICD-10-CM | POA: Diagnosis not present

## 2024-02-02 DIAGNOSIS — R2681 Unsteadiness on feet: Secondary | ICD-10-CM | POA: Diagnosis not present

## 2024-02-07 DIAGNOSIS — R0682 Tachypnea, not elsewhere classified: Secondary | ICD-10-CM | POA: Diagnosis not present

## 2024-02-15 DIAGNOSIS — I10 Essential (primary) hypertension: Secondary | ICD-10-CM | POA: Diagnosis not present

## 2024-02-15 DIAGNOSIS — D508 Other iron deficiency anemias: Secondary | ICD-10-CM | POA: Diagnosis not present

## 2024-02-15 DIAGNOSIS — R600 Localized edema: Secondary | ICD-10-CM | POA: Diagnosis not present

## 2024-04-15 DIAGNOSIS — I69811 Memory deficit following other cerebrovascular disease: Secondary | ICD-10-CM | POA: Diagnosis not present

## 2024-04-16 DIAGNOSIS — E568 Deficiency of other vitamins: Secondary | ICD-10-CM | POA: Diagnosis not present

## 2024-04-16 DIAGNOSIS — D508 Other iron deficiency anemias: Secondary | ICD-10-CM | POA: Diagnosis not present

## 2024-04-16 DIAGNOSIS — E611 Iron deficiency: Secondary | ICD-10-CM | POA: Diagnosis not present

## 2024-04-16 DIAGNOSIS — D638 Anemia in other chronic diseases classified elsewhere: Secondary | ICD-10-CM | POA: Diagnosis not present

## 2024-04-18 DIAGNOSIS — R7303 Prediabetes: Secondary | ICD-10-CM | POA: Diagnosis not present

## 2024-04-18 DIAGNOSIS — K5900 Constipation, unspecified: Secondary | ICD-10-CM | POA: Diagnosis not present

## 2024-04-18 DIAGNOSIS — D509 Iron deficiency anemia, unspecified: Secondary | ICD-10-CM | POA: Diagnosis not present

## 2024-04-18 DIAGNOSIS — I10 Essential (primary) hypertension: Secondary | ICD-10-CM | POA: Diagnosis not present

## 2024-04-23 DIAGNOSIS — E559 Vitamin D deficiency, unspecified: Secondary | ICD-10-CM | POA: Diagnosis not present

## 2024-04-23 DIAGNOSIS — E119 Type 2 diabetes mellitus without complications: Secondary | ICD-10-CM | POA: Diagnosis not present

## 2024-04-23 DIAGNOSIS — Z79899 Other long term (current) drug therapy: Secondary | ICD-10-CM | POA: Diagnosis not present

## 2024-04-23 DIAGNOSIS — D649 Anemia, unspecified: Secondary | ICD-10-CM | POA: Diagnosis not present

## 2024-05-09 DIAGNOSIS — I10 Essential (primary) hypertension: Secondary | ICD-10-CM | POA: Diagnosis not present

## 2024-05-09 DIAGNOSIS — K5900 Constipation, unspecified: Secondary | ICD-10-CM | POA: Diagnosis not present

## 2024-05-10 DIAGNOSIS — I10 Essential (primary) hypertension: Secondary | ICD-10-CM | POA: Diagnosis not present

## 2024-05-10 DIAGNOSIS — R001 Bradycardia, unspecified: Secondary | ICD-10-CM | POA: Diagnosis not present

## 2024-05-17 DIAGNOSIS — E611 Iron deficiency: Secondary | ICD-10-CM | POA: Diagnosis not present

## 2024-05-17 DIAGNOSIS — D508 Other iron deficiency anemias: Secondary | ICD-10-CM | POA: Diagnosis not present

## 2024-05-17 DIAGNOSIS — D638 Anemia in other chronic diseases classified elsewhere: Secondary | ICD-10-CM | POA: Diagnosis not present

## 2024-05-17 DIAGNOSIS — E568 Deficiency of other vitamins: Secondary | ICD-10-CM | POA: Diagnosis not present
